# Patient Record
Sex: Female | Born: 1995 | Race: Black or African American | Hispanic: Yes | Marital: Single | Smoking: Never smoker
Health system: Southern US, Community
[De-identification: ages and names within clinical notes are randomized; demographics above are authoritative.]

## PROBLEM LIST (undated history)

## (undated) DIAGNOSIS — M329 Systemic lupus erythematosus, unspecified: Secondary | ICD-10-CM

## (undated) DIAGNOSIS — K5190 Ulcerative colitis, unspecified, without complications: Secondary | ICD-10-CM

## (undated) DIAGNOSIS — IMO0002 Reserved for concepts with insufficient information to code with codable children: Secondary | ICD-10-CM

## (undated) DIAGNOSIS — J45909 Unspecified asthma, uncomplicated: Secondary | ICD-10-CM

## (undated) DIAGNOSIS — K900 Celiac disease: Secondary | ICD-10-CM

## (undated) DIAGNOSIS — N809 Endometriosis, unspecified: Secondary | ICD-10-CM

## (undated) DIAGNOSIS — N83209 Unspecified ovarian cyst, unspecified side: Secondary | ICD-10-CM

## (undated) DIAGNOSIS — R197 Diarrhea, unspecified: Secondary | ICD-10-CM

## (undated) DIAGNOSIS — M359 Systemic involvement of connective tissue, unspecified: Secondary | ICD-10-CM

## (undated) DIAGNOSIS — R569 Unspecified convulsions: Secondary | ICD-10-CM

## (undated) HISTORY — PX: HERNIA REPAIR: SHX51

## (undated) HISTORY — PX: APPENDECTOMY: SHX54

## (undated) HISTORY — PX: EXCISION OF ENDOMETRIOMA: SHX6473

## (undated) HISTORY — PX: FOOT FRACTURE SURGERY: SHX645

## (undated) HISTORY — PX: CHOLECYSTECTOMY: SHX55

## (undated) HISTORY — PX: OTHER SURGICAL HISTORY: SHX169

## (undated) HISTORY — PX: TONSILLECTOMY: SUR1361

---
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
## 2013-01-22 HISTORY — PX: CHOLECYSTECTOMY: SHX55

## 2017-02-28 DIAGNOSIS — G43909 Migraine, unspecified, not intractable, without status migrainosus: Secondary | ICD-10-CM | POA: Insufficient documentation

## 2017-02-28 DIAGNOSIS — D649 Anemia, unspecified: Secondary | ICD-10-CM | POA: Insufficient documentation

## 2017-03-12 DIAGNOSIS — M328 Other forms of systemic lupus erythematosus: Secondary | ICD-10-CM | POA: Insufficient documentation

## 2017-03-12 DIAGNOSIS — M329 Systemic lupus erythematosus, unspecified: Secondary | ICD-10-CM | POA: Insufficient documentation

## 2017-07-02 DIAGNOSIS — Z79899 Other long term (current) drug therapy: Secondary | ICD-10-CM | POA: Insufficient documentation

## 2017-07-02 DIAGNOSIS — R760 Raised antibody titer: Secondary | ICD-10-CM | POA: Insufficient documentation

## 2017-07-02 DIAGNOSIS — Z86711 Personal history of pulmonary embolism: Secondary | ICD-10-CM | POA: Insufficient documentation

## 2018-03-14 DIAGNOSIS — N809 Endometriosis, unspecified: Secondary | ICD-10-CM | POA: Insufficient documentation

## 2018-11-17 DIAGNOSIS — N823 Fistula of vagina to large intestine: Secondary | ICD-10-CM | POA: Insufficient documentation

## 2018-11-28 ENCOUNTER — Emergency Department (HOSPITAL_COMMUNITY): Payer: 59

## 2018-11-28 ENCOUNTER — Encounter (HOSPITAL_COMMUNITY): Payer: Self-pay

## 2018-11-28 ENCOUNTER — Other Ambulatory Visit: Payer: Self-pay

## 2018-11-28 ENCOUNTER — Emergency Department (HOSPITAL_COMMUNITY)
Admission: EM | Admit: 2018-11-28 | Discharge: 2018-11-29 | Disposition: A | Discharge status: Home / Self Care | Payer: 59 | Attending: Emergency Medicine | Admitting: Emergency Medicine

## 2018-11-28 DIAGNOSIS — R1084 Generalized abdominal pain: Secondary | ICD-10-CM

## 2018-11-28 DIAGNOSIS — E860 Dehydration: Secondary | ICD-10-CM | POA: Diagnosis not present

## 2018-11-28 DIAGNOSIS — R197 Diarrhea, unspecified: Secondary | ICD-10-CM

## 2018-11-28 DIAGNOSIS — J45909 Unspecified asthma, uncomplicated: Secondary | ICD-10-CM | POA: Insufficient documentation

## 2018-11-28 DIAGNOSIS — R109 Unspecified abdominal pain: Secondary | ICD-10-CM | POA: Diagnosis present

## 2018-11-28 DIAGNOSIS — Z79899 Other long term (current) drug therapy: Secondary | ICD-10-CM | POA: Diagnosis not present

## 2018-11-28 HISTORY — DX: Unspecified asthma, uncomplicated: J45.909

## 2018-11-28 LAB — CBC
HCT: 39.5 % (ref 36.0–46.0)
Hemoglobin: 12.5 g/dL (ref 12.0–15.0)
MCH: 23.9 pg — ABNORMAL LOW (ref 26.0–34.0)
MCHC: 31.6 g/dL (ref 30.0–36.0)
MCV: 75.5 fL — ABNORMAL LOW (ref 80.0–100.0)
Platelets: 366 10*3/uL (ref 150–400)
RBC: 5.23 MIL/uL — ABNORMAL HIGH (ref 3.87–5.11)
RDW: 15.9 % — ABNORMAL HIGH (ref 11.5–15.5)
WBC: 7.2 10*3/uL (ref 4.0–10.5)
nRBC: 0 % (ref 0.0–0.2)

## 2018-11-28 LAB — URINALYSIS, ROUTINE W REFLEX MICROSCOPIC
Bilirubin Urine: NEGATIVE
Glucose, UA: NEGATIVE mg/dL
Hgb urine dipstick: NEGATIVE
Ketones, ur: NEGATIVE mg/dL
Nitrite: NEGATIVE
Protein, ur: NEGATIVE mg/dL
Specific Gravity, Urine: 1.024 (ref 1.005–1.030)
pH: 5 (ref 5.0–8.0)

## 2018-11-28 LAB — COMPREHENSIVE METABOLIC PANEL
ALT: 16 U/L (ref 0–44)
AST: 18 U/L (ref 15–41)
Albumin: 4.1 g/dL (ref 3.5–5.0)
Alkaline Phosphatase: 68 U/L (ref 38–126)
Anion gap: 9 (ref 5–15)
BUN: 6 mg/dL (ref 6–20)
CO2: 23 mmol/L (ref 22–32)
Calcium: 8.9 mg/dL (ref 8.9–10.3)
Chloride: 108 mmol/L (ref 98–111)
Creatinine, Ser: 0.7 mg/dL (ref 0.44–1.00)
GFR calc Af Amer: >60 mL/min (ref >60)
GFR calc non Af Amer: >60 mL/min (ref >60)
Glucose, Bld: 94 mg/dL (ref 70–99)
Potassium: 3.3 mmol/L — ABNORMAL LOW (ref 3.5–5.1)
Sodium: 140 mmol/L (ref 135–145)
Total Bilirubin: 0.4 mg/dL (ref 0.3–1.2)
Total Protein: 7.5 g/dL (ref 6.5–8.1)

## 2018-11-28 LAB — I-STAT BETA HCG BLOOD, ED (MC, WL, AP ONLY): I-stat hCG, quantitative: <5 m[IU]/mL (ref <5)

## 2018-11-28 LAB — LIPASE, BLOOD: Lipase: 32 U/L (ref 11–51)

## 2018-11-28 MED ORDER — SODIUM CHLORIDE 0.9 % IV BOLUS
1000.0000 mL | Freq: Once | INTRAVENOUS | Status: AC
  Administered 2018-11-28: 23:00:00 1000 mL via INTRAVENOUS

## 2018-11-28 MED ORDER — POTASSIUM CHLORIDE CRYS ER 20 MEQ PO TBCR
40.0000 meq | EXTENDED_RELEASE_TABLET | Freq: Once | ORAL | Status: AC
  Administered 2018-11-29: 40 meq via ORAL
  Filled 2018-11-28: qty 2

## 2018-11-28 MED ORDER — ONDANSETRON HCL 4 MG/2ML IJ SOLN
4.0000 mg | Freq: Once | INTRAMUSCULAR | Status: AC
  Administered 2018-11-28: 4 mg via INTRAVENOUS
  Filled 2018-11-28: qty 2

## 2018-11-28 MED ORDER — SODIUM CHLORIDE (PF) 0.9 % IJ SOLN
INTRAMUSCULAR | Status: AC
  Filled 2018-11-28: qty 50

## 2018-11-28 MED ORDER — MORPHINE SULFATE (PF) 4 MG/ML IV SOLN
4.0000 mg | Freq: Once | INTRAVENOUS | Status: AC
  Administered 2018-11-28: 4 mg via INTRAVENOUS
  Filled 2018-11-28: qty 1

## 2018-11-28 MED ORDER — SODIUM CHLORIDE 0.9% FLUSH
3.0000 mL | Freq: Once | INTRAVENOUS | Status: AC
  Administered 2018-11-28: 3 mL via INTRAVENOUS

## 2018-11-28 NOTE — ED Triage Notes (Signed)
Patient c/o sharp abdominal cramping, nausea, and diarrhea since August that has gotten worse the past few days.  MD worries for colitis   Patient has seen a GI specialist and is suppose to be having a colonoscopy anal rectal exam Monday. Patient states they are also going to fix her fistula.   HX. Ciliac disease   A/ox4 Ambulatory in triage.   7/10 pain

## 2018-11-28 NOTE — ED Notes (Signed)
Tried to get blood from pt but unsuccessful due to dehydration.

## 2018-11-28 NOTE — ED Provider Notes (Signed)
[name] DEPT Provider Note   CSN: [id number] Arrival date & time: 11/28/18  1732     History   Chief Complaint Chief Complaint  Patient presents with  . Abdominal Pain  . Diarrhea    HPI [name] is a 23 y.o. female with a hx of asthma, Lupus, endometriosis presents to the Emergency Department complaining of gradual, persistent, progressively worsening generalized abd pain. Pt reports associated nausea, vomiting and diarrhea onset Sept 2020.  She reports worsening of her symptoms in the last 4 days with a change in intensity and quality of her abd pain and increasing diarrhea.  Pt reports concerns for dehydration given her worsening symptoms.  No melena or hematochezia.  Pt reports intermittent vomiting usually controlled with Zofran.  Imodium does not improve the diarrhea. 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  Pt reports fever on 11/4 but has been taking tylenol for abd pain and has not had any additional measured fever since that time.   She is currently followed by [facility] and colorectal surgery.  She has a colonoscopy scheduled for Monday and a EUA for her rectovaginal fistula on Tuesday.  Pt surgical hx includes cholecystectomy (2015), endometrial revision (2020) and appendectomy/hernia repair (2020).  Pt reports hx of ilius requiring hospitalization x 3 over the last 4 years managed medically with NG tube.       The history is provided by the patient and medical records. No language interpreter was used.    Past Medical History:  Diagnosis Date  . Asthma     There are no active problems to display for this patient.   Past Surgical History:  Procedure Laterality Date  . APPENDECTOMY    . CHOLECYSTECTOMY       OB History   No obstetric history on file.      Home Medications    Prior to Admission medications   Medication Sig Start Date End Date Taking?  Authorizing Provider  diphenhydramine-acetaminophen (TYLENOL PM) 25-500 MG TABS tablet Take 2 tablets by mouth at bedtime as needed (sleep).   Yes [provider]  hydroxychloroquine (PLAQUENIL) 200 MG tablet Take 200 mg by mouth daily.  03/14/18  Yes [provider]  loperamide (IMODIUM) 2 MG capsule Take 2 mg by mouth 2 (two) times daily as needed for diarrhea or loose stools.   Yes [provider]  Norethindrone-Ethinyl Estradiol-Fe Biphas (LO LOESTRIN FE) 1 MG-10 MCG / 10 MCG tablet Take 1 tablet by mouth daily.   Yes [provider]  sertraline (ZOLOFT) 25 MG tablet Take 25 mg by mouth daily.    Yes [provider]  dicyclomine (BENTYL) 20 MG tablet Take 1 tablet (20 mg total) by mouth 2 (two) times daily. 11/29/18   Lashara Urey, Jarrett Soho, PA-C  ondansetron (ZOFRAN ODT) 8 MG disintegrating tablet 8mg  ODT q4 hours prn nausea 11/29/18   Jasnoor Trussell, Jarrett Soho, PA-C    Family History No family history on file.  Social History Social History   Tobacco Use  . Smoking status: Never Smoker  . Smokeless tobacco: Never Used  Substance Use Topics  . Alcohol use: Not on file  . Drug use: Not on file     Allergies   Azithromycin, Amoxicillin, Fish allergy, Pantoprazole, and Peanut-containing drug products   Review of Systems Review of Systems  Constitutional: Negative for appetite change, diaphoresis, fatigue, fever and unexpected weight change.  HENT: Negative for mouth sores.   Eyes:  Negative for visual disturbance.  Respiratory: Negative for cough, chest tightness, shortness of breath and wheezing.   Cardiovascular: Negative for chest pain.  Gastrointestinal: Positive for abdominal pain, diarrhea, nausea and vomiting. Negative for anal bleeding, blood in stool, constipation and rectal pain.  Endocrine: Negative for polydipsia, polyphagia and polyuria.  Genitourinary: Positive for vaginal discharge. Negative for dysuria, frequency, hematuria and  urgency.  Musculoskeletal: Negative for back pain and neck stiffness.  Skin: Negative for rash.  Allergic/Immunologic: Negative for immunocompromised state.  Neurological: Negative for syncope, light-headedness and headaches.  Hematological: Does not bruise/bleed easily.  Psychiatric/Behavioral: Negative for sleep disturbance. The patient is not nervous/anxious.      Physical Exam Updated Vital Signs BP 135/77 (BP Location: Right Arm)   Pulse 94   Temp 98.2 F (36.8 C) (Oral)   Resp 18   LMP 10/28/2018   SpO2 98%   Physical Exam Vitals signs and nursing note reviewed.  Constitutional:      General: She is not in acute distress.    Appearance: She is not diaphoretic.  HENT:     Head: Normocephalic.     Mouth/Throat:     Mouth: Mucous membranes are dry.  Eyes:     General: No scleral icterus.    Conjunctiva/sclera: Conjunctivae normal.  Neck:     Musculoskeletal: Normal range of motion.  Cardiovascular:     Rate and Rhythm: Normal rate and regular rhythm.     Pulses: Normal pulses.          Radial pulses are 2+ on the right side and 2+ on the left side.  Pulmonary:     Effort: No tachypnea, accessory muscle usage, prolonged expiration, respiratory distress or retractions.     Breath sounds: No stridor.     Comments: Equal chest rise. No increased work of breathing. Abdominal:     General: There is distension.     Palpations: Abdomen is soft.     Tenderness: There is generalized abdominal tenderness. There is guarding. There is no right CVA tenderness, left CVA tenderness or rebound. Negative signs include Murphy's sign and McBurney's sign.     Hernia: No hernia is present.     Comments: Multiple well healed surgical scars  Musculoskeletal:     Comments: Moves all extremities equally and without difficulty.  Skin:    General: Skin is warm and dry.     Capillary Refill: Capillary refill takes less than 2 seconds.  Neurological:     Mental Status: She is alert.      GCS: GCS eye subscore is 4. GCS verbal subscore is 5. GCS motor subscore is 6.     Comments: Speech is clear and goal oriented.  Psychiatric:        Mood and Affect: Mood normal.      ED Treatments / Results  Labs (all labs ordered are listed, but only abnormal results are displayed) Labs Reviewed  COMPREHENSIVE METABOLIC PANEL - Abnormal; Notable for the following components:      Result Value   Potassium 3.3 (*)    All other components within normal limits  CBC - Abnormal; Notable for the following components:   RBC 5.23 (*)    MCV 75.5 (*)    MCH 23.9 (*)    RDW 15.9 (*)    All other components within normal limits  URINALYSIS, ROUTINE W REFLEX MICROSCOPIC - Abnormal; Notable for the following components:   APPearance HAZY (*)    Leukocytes,Ua MODERATE (*)  Bacteria, UA MANY (*)    All other components within normal limits  URINE CULTURE  LIPASE, BLOOD  I-STAT BETA HCG BLOOD, ED ([facility], [facility], [facility] ONLY)    Radiology Ct Abdomen Pelvis Wo Contrast  Result Date: 11/29/2018 CLINICAL DATA:  Abdominal distension. Cramping. Nausea and vomiting. EXAM: CT ABDOMEN AND PELVIS WITHOUT CONTRAST TECHNIQUE: Multidetector CT imaging of the abdomen and pelvis was performed following the standard protocol without IV contrast. COMPARISON:  None. FINDINGS: Lower chest: The lung bases are clear. The heart size is normal. Hepatobiliary: There is a small 9 mm hypodensity in hepatic segment 8. This is not well characterized on this exam but is statistically most likely to represent a benign cyst. Status post cholecystectomy.There is no biliary ductal dilation. Pancreas: Normal contours without ductal dilatation. No peripancreatic fluid collection. Spleen: No splenic laceration or hematoma. Adrenals/Urinary Tract: --Adrenal glands: No adrenal hemorrhage. --Right kidney/ureter: No hydronephrosis or perinephric hematoma. --Left kidney/ureter: No hydronephrosis or perinephric hematoma. --Urinary bladder:  Unremarkable. Stomach/Bowel: --Stomach/Duodenum: No hiatal hernia or other gastric abnormality. Normal duodenal course and caliber. --Small bowel: No dilatation or inflammation. --Colon: No focal abnormality. --Appendix: Surgically absent. Vascular/Lymphatic: Normal course and caliber of the major abdominal vessels. --No retroperitoneal lymphadenopathy. --No mesenteric lymphadenopathy. --No pelvic or inguinal lymphadenopathy. Reproductive: Unremarkable Other: No ascites or free air. The abdominal wall is normal. Musculoskeletal. No acute displaced fractures. IMPRESSION: No acute abdominopelvic abnormality. Electronically Signed   By: Constance Holster M.D.   On: 11/29/2018 02:58    Procedures Procedures (including critical care time)  Medications Ordered in ED Medications  iohexol (OMNIPAQUE) 300 MG/ML solution 100 mL (has no administration in time range)  dicyclomine (BENTYL) capsule 10 mg (has no administration in time range)  sodium chloride flush (NS) 0.9 % injection 3 mL (3 mLs Intravenous Given 11/28/18 2302)  sodium chloride 0.9 % bolus 1,000 mL (0 mLs Intravenous Stopped 11/29/18 0038)  ondansetron (ZOFRAN) injection 4 mg (4 mg Intravenous Given 11/28/18 2302)  morphine 4 MG/ML injection 4 mg (4 mg Intravenous Given 11/28/18 2301)  potassium chloride SA (KLOR-CON) CR tablet 40 mEq (40 mEq Oral Given 11/29/18 0034)  diphenhydrAMINE (BENADRYL) injection 25 mg (25 mg Intravenous Given 11/29/18 0035)  promethazine (PHENERGAN) injection 12.5 mg (12.5 mg Intravenous Given 11/29/18 0035)  sodium chloride 0.9 % bolus 1,000 mL (0 mLs Intravenous Stopped 11/29/18 0344)     Initial Impression / Assessment and Plan / ED Course  I have reviewed the triage vital signs and the nursing notes.  Pertinent labs & imaging results that were available during my care of the patient were reviewed by me and considered in my medical decision making (see chart for details).  Clinical Course as of Nov 29 403  Sat  Nov 29, 2018  0030 Patient reports mild itching after pain medication but improvement in pain.  She does have some persistent nausea.  Medications ordered.  Will give additional fluids.   [HM]  0031 Hypokalemia noted and oral potassium given.  Potassium(!): 3.3 [HM]  0031 Patient now reporting progressive itching after previous CT scans with contrast.  Will change to CT without.  CT ABDOMEN PELVIS W CONTRAST [HM]  0403 Pt reports she is feeling much better.  Pain is improved.    [HM]    Clinical Course User Index [HM] Elbert Polyakov, Jarrett Soho, [place]       Patient presents with generalized abdominal pain and cramping along with diarrhea and concerns for dehydration.  Patient with previous cholecystectomy and appendectomy thus  ruling out cholecystitis and appendicitis.  Labs are reassuring with mild hypokalemia.  Patient given potassium without emesis.  Fluids given here in the emergency department.  Concern for possible UTI however no dysuria.  Patient does have a rectovaginal fistula which may be contributing to contamination of her urine.  Urine culture sent.    Symptoms controlled here in the emergency department.  Given change in symptoms, CT scan ordered.  CT scan is reassuring without specific findings for colitis.  No bowel obstruction or ileus.  Patient improved after treatment here in the emergency department.  Will discharge to home.  She has appointment scheduled for Monday and Tuesday.  Discussed reasons to return immediately to the emergency department.  Patient states understanding and is in agreement with the plan.  Final Clinical Impressions(s) / ED Diagnoses   Final diagnoses:  Diarrhea, unspecified type  Dehydration  Generalized abdominal pain    ED Discharge Orders         Ordered    dicyclomine (BENTYL) 20 MG tablet  2 times daily     11/29/18 0358    ondansetron (ZOFRAN ODT) 8 MG disintegrating tablet     11/29/18 0358           Jaymarie Yeakel, Jarrett Soho, PA-C  11/29/18 0405    Lajean Saver, MD 12/03/18 1505

## 2018-11-29 ENCOUNTER — Encounter (HOSPITAL_COMMUNITY): Payer: Self-pay

## 2018-11-29 ENCOUNTER — Emergency Department (HOSPITAL_COMMUNITY): Payer: 59

## 2018-11-29 MED ORDER — SODIUM CHLORIDE 0.9 % IV BOLUS
1000.0000 mL | Freq: Once | INTRAVENOUS | Status: AC
  Administered 2018-11-29: 1000 mL via INTRAVENOUS

## 2018-11-29 MED ORDER — DICYCLOMINE HCL 20 MG PO TABS: 20.0000 mg | ORAL_TABLET | Freq: Two times a day (BID) | ORAL | 0 refills | Status: DC

## 2018-11-29 MED ORDER — PROMETHAZINE HCL 25 MG/ML IJ SOLN
12.5000 mg | Freq: Once | INTRAMUSCULAR | Status: AC
  Administered 2018-11-29: 12.5 mg via INTRAVENOUS
  Filled 2018-11-29: qty 1

## 2018-11-29 MED ORDER — ONDANSETRON 8 MG PO TBDP: ORAL_TABLET | ORAL | 0 refills | Status: DC

## 2018-11-29 MED ORDER — IOHEXOL 300 MG/ML  SOLN: 100.0000 mL | Freq: Once | INTRAMUSCULAR | Status: DC | PRN

## 2018-11-29 MED ORDER — DIPHENHYDRAMINE HCL 50 MG/ML IJ SOLN
25.0000 mg | Freq: Once | INTRAMUSCULAR | Status: AC
  Administered 2018-11-29: 25 mg via INTRAVENOUS
  Filled 2018-11-29: qty 1

## 2018-11-29 MED ORDER — DICYCLOMINE HCL 10 MG PO CAPS
10.0000 mg | ORAL_CAPSULE | Freq: Once | ORAL | Status: AC
  Administered 2018-11-29: 10 mg via ORAL
  Filled 2018-11-29: qty 1

## 2018-11-29 NOTE — Discharge Instructions (Signed)
1. Medications: Continue home medications, use Bentyl for cramping 2. Treatment: rest, drink plenty of fluids,  3. Follow Up: Please followup with your gastroenterologist and colorectal surgeon for further evaluation and treatment.  Please keep your appointment on Monday and Tuesday. Please return to the ER for new or worsening symptoms, high fevers, persistent vomiting, blood in stool or emesis.

## 2018-12-14 ENCOUNTER — Encounter (HOSPITAL_COMMUNITY): Payer: Self-pay | Admitting: *Deleted

## 2018-12-14 ENCOUNTER — Ambulatory Visit (HOSPITAL_COMMUNITY)
Admission: EM | Admit: 2018-12-14 | Discharge: 2018-12-14 | Disposition: A | Discharge status: Home / Self Care | Payer: 59 | Attending: Family Medicine | Admitting: Family Medicine

## 2018-12-14 ENCOUNTER — Other Ambulatory Visit: Payer: Self-pay

## 2018-12-14 DIAGNOSIS — R05 Cough: Secondary | ICD-10-CM | POA: Insufficient documentation

## 2018-12-14 DIAGNOSIS — R6883 Chills (without fever): Secondary | ICD-10-CM | POA: Diagnosis not present

## 2018-12-14 DIAGNOSIS — R059 Cough, unspecified: Secondary | ICD-10-CM

## 2018-12-14 DIAGNOSIS — Z880 Allergy status to penicillin: Secondary | ICD-10-CM | POA: Diagnosis not present

## 2018-12-14 DIAGNOSIS — Z7951 Long term (current) use of inhaled steroids: Secondary | ICD-10-CM | POA: Insufficient documentation

## 2018-12-14 DIAGNOSIS — Z881 Allergy status to other antibiotic agents status: Secondary | ICD-10-CM | POA: Diagnosis not present

## 2018-12-14 DIAGNOSIS — R0602 Shortness of breath: Secondary | ICD-10-CM | POA: Diagnosis not present

## 2018-12-14 DIAGNOSIS — J45909 Unspecified asthma, uncomplicated: Secondary | ICD-10-CM | POA: Diagnosis not present

## 2018-12-14 DIAGNOSIS — Z79899 Other long term (current) drug therapy: Secondary | ICD-10-CM | POA: Insufficient documentation

## 2018-12-14 DIAGNOSIS — Z20828 Contact with and (suspected) exposure to other viral communicable diseases: Secondary | ICD-10-CM | POA: Diagnosis not present

## 2018-12-14 DIAGNOSIS — R000 Tachycardia, unspecified: Secondary | ICD-10-CM | POA: Diagnosis not present

## 2018-12-14 DIAGNOSIS — M329 Systemic lupus erythematosus, unspecified: Secondary | ICD-10-CM | POA: Diagnosis not present

## 2018-12-14 DIAGNOSIS — K900 Celiac disease: Secondary | ICD-10-CM | POA: Insufficient documentation

## 2018-12-14 HISTORY — DX: Systemic lupus erythematosus, unspecified: M32.9

## 2018-12-14 HISTORY — DX: Endometriosis, unspecified: N80.9

## 2018-12-14 HISTORY — DX: Celiac disease: K90.0

## 2018-12-14 HISTORY — DX: Reserved for concepts with insufficient information to code with codable children: IMO0002

## 2018-12-14 LAB — POC SARS CORONAVIRUS 2 AG -  ED: SARS Coronavirus 2 Ag: NEGATIVE

## 2018-12-14 LAB — POC SARS CORONAVIRUS 2 AG: SARS Coronavirus 2 Ag: NEGATIVE

## 2018-12-14 MED ORDER — HYDROCOD POLST-CPM POLST ER 10-8 MG/5ML PO SUER: 5.0000 mL | Freq: Two times a day (BID) | ORAL | 0 refills | Status: DC | PRN

## 2018-12-14 NOTE — Discharge Instructions (Addendum)
Recommend continue current medication as directed. Add Tussionex 1 teaspoon every 12 hours as needed for cough. Rest. STAY at HOME. If cough, shortness of breath worsen, go to the ER ASAP for further evaluation. Otherwise follow-up pending COVID 19 test results.

## 2018-12-14 NOTE — ED Provider Notes (Signed)
[facility]    CSN: [id number] Arrival date & time: 12/14/18  1223      History   Chief Complaint Chief Complaint  Patient presents with   Cough   Chills    HPI [name] is a 23 y.o. female.   23 year old female presents with continued cough, shortness of breath, chills and possible fever for the past 3 to 4 days. Started with mild body aches and cough 4 days ago. Has history of asthma and was concerned about symptoms and was seen at a local Urgent Care (unable to pull up notes/records). They performed a chest x-ray which she indicated was "negative' and started her on Doxycycline, Tessalon cough pills, Prednisone 12 day dose taper, and told her to continue her Flovent and Albuterol inhaler. She then developed a low grade fever and worsening of cough. Denies any nasal congestion. Has an irritated throat and vomited today due to the cough. Has taken Theraflu and Tessalon with no relief. Unable to sleep at night. 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 She declined a rapid COVID test today indicating that she just had the COVID test done about 10 days ago for a GI procedure which was negative. She was positive for COVID back in April 2020 and believes she can not get COVID again. She had contacted the other Urgent Care center today regarding her symptoms and they recommended she go to the ER. Other chronic health issues include Celiac disease and chronic GI issues/diarrhea, Lupus, Endometriosis and Insomnia. Currently on Plaquenil, Zoloft, Lo loestrin OCP, and Bentyl daily and Immodium, Zofran and Tylenol PM prn.   The history is provided by the patient.    Past Medical History:  Diagnosis Date   Asthma    Celiac disease    Endometriosis    Lupus ([place])     There are no active problems to display for this patient.   Past Surgical History:  Procedure Laterality Date   APPENDECTOMY     CHOLECYSTECTOMY       OB History   No obstetric history on file.      Home Medications    Prior to Admission medications   Medication Sig Start Date End Date Taking? Authorizing Provider  ALBUTEROL IN Inhale into the lungs.   Yes [provider]  benzonatate (TESSALON) 100 MG capsule Take by mouth 3 (three) times daily as needed for cough.   Yes [provider]  dicyclomine (BENTYL) 20 MG tablet Take 1 tablet (20 mg total) by mouth 2 (two) times daily. 11/29/18  Yes Muthersbaugh, Jarrett Soho, PA-C  diphenhydramine-acetaminophen (TYLENOL PM) 25-500 MG TABS tablet Take 2 tablets by mouth at bedtime as needed (sleep).   Yes [provider]  DOXYCYCLINE HYCLATE PO Take by mouth.   Yes [provider]  Fluticasone Propionate HFA (FLOVENT HFA IN) Inhale into the lungs.   Yes [provider]  hydroxychloroquine (PLAQUENIL) 200 MG tablet Take 200 mg by mouth daily.  03/14/18  Yes [provider]  Norethindrone-Ethinyl Estradiol-Fe Biphas (LO LOESTRIN FE) 1 MG-10 MCG / 10 MCG tablet Take 1 tablet by mouth daily.   Yes [provider]  sertraline (ZOLOFT) 25 MG tablet Take 25 mg by mouth daily.    Yes [provider]  chlorpheniramine-HYDROcodone (TUSSIONEX PENNKINETIC ER) 10-8 MG/5ML SUER Take 5 mLs by mouth every 12 (twelve) hours as needed for cough. 12/14/18   Katy Apo,  NP  loperamide (IMODIUM) 2 MG capsule Take 2 mg by mouth 2 (two) times daily as needed for diarrhea or loose stools.    [provider]  ondansetron (ZOFRAN ODT) 8 MG disintegrating tablet 5m ODT q4 hours prn nausea 11/29/18   Muthersbaugh, HJarrett Soho PA-C    Family History Family History  Problem Relation Age of Onset   Hypertension Mother    Hypercholesterolemia Mother    Diabetes Father    Hypertension Father    Cancer Father     Social History Social History   Tobacco Use   Smoking status: Never Smoker   Smokeless tobacco: Never Used  Substance  Use Topics   Alcohol use: Never    Frequency: Never   Drug use: Never     Allergies   Azithromycin, Amoxicillin, Fish allergy, Pantoprazole, and Peanut-containing drug products   Review of Systems Review of Systems  Constitutional: Positive for activity change, appetite change, chills, diaphoresis (night sweats), fatigue and fever.  HENT: Positive for postnasal drip and sore throat. Negative for congestion, ear discharge, ear pain, facial swelling, mouth sores, nosebleeds, rhinorrhea, sinus pressure, sinus pain, sneezing and trouble swallowing.   Eyes: Negative for photophobia, pain, discharge, redness, itching and visual disturbance.  Respiratory: Positive for cough, chest tightness and shortness of breath. Negative for wheezing.   Cardiovascular: Negative for chest pain and palpitations.  Gastrointestinal: Positive for abdominal pain, diarrhea, nausea and vomiting.  Musculoskeletal: Positive for arthralgias and myalgias. Negative for neck pain and neck stiffness.  Skin: Negative for color change, rash and wound.  Allergic/Immunologic: Negative for environmental allergies, food allergies and immunocompromised state.  Neurological: Positive for weakness, light-headedness and headaches. Negative for dizziness, tremors, seizures, syncope and numbness.  Hematological: Negative for adenopathy. Does not bruise/bleed easily.  Psychiatric/Behavioral: Positive for sleep disturbance.     Physical Exam Triage Vital Signs ED Triage Vitals  Enc Vitals Group     BP 12/14/18 1259 (!) 116/102     Pulse Rate 12/14/18 1259 (!) 116     Resp 12/14/18 1259 20     Temp 12/14/18 1259 98.6 F (37 C)     Temp src --      SpO2 12/14/18 1259 99 %     Weight --      Height --      Head Circumference --      Peak Flow --      Pain Score 12/14/18 1301 8     Pain Loc --      Pain Edu? --      Excl. in [place] --    No data found.  Updated Vital Signs BP (!) 116/102 Comment: with pt frequently  coughing during BP reading   Pulse (!) 116    Temp 98.6 F (37 C)    Resp 20    SpO2 99%   Visual Acuity Right Eye Distance:   Left Eye Distance:   Bilateral Distance:    Right Eye Near:   Left Eye Near:    Bilateral Near:     Physical Exam Vitals signs and nursing note reviewed.  Constitutional:      General: She is awake. She is not in acute distress.    Appearance: She is well-developed, well-groomed and overweight. She is ill-appearing.     Comments: Patient sitting comfortably in exam chair in no acute distress but appears ill and had just vomited due to cough.   HENT:     Head: Normocephalic and atraumatic.  Right Ear: Hearing, tympanic membrane, ear canal and external ear normal.     Left Ear: Hearing, tympanic membrane, ear canal and external ear normal.     Nose: Nose normal.     Right Sinus: No maxillary sinus tenderness or frontal sinus tenderness.     Left Sinus: No maxillary sinus tenderness or frontal sinus tenderness.     Mouth/Throat:     Lips: Pink.     Mouth: Mucous membranes are moist.     Pharynx: Uvula midline. Posterior oropharyngeal erythema present. No pharyngeal swelling, oropharyngeal exudate or uvula swelling.  Eyes:     Extraocular Movements: Extraocular movements intact.     Conjunctiva/sclera: Conjunctivae normal.  Neck:     Musculoskeletal: Normal range of motion and neck supple. No neck rigidity or muscular tenderness.  Cardiovascular:     Rate and Rhythm: Regular rhythm. Tachycardia present.     Heart sounds: Normal heart sounds. No murmur.  Pulmonary:     Effort: Pulmonary effort is normal. Tachypnea (borderline) present. No respiratory distress.     Breath sounds: Normal air entry. No stridor or decreased air movement. Examination of the right-upper field reveals decreased breath sounds. Examination of the left-upper field reveals decreased breath sounds. Examination of the right-middle field reveals decreased breath sounds. Examination of  the right-lower field reveals decreased breath sounds. Examination of the left-lower field reveals decreased breath sounds. Decreased breath sounds present. No wheezing, rhonchi or rales.     Comments: All lungs fields have quieter breath sounds but no distinct wheezing or rhonchi/crackles heard.  Musculoskeletal: Normal range of motion.  Lymphadenopathy:     Cervical: No cervical adenopathy.  Skin:    General: Skin is warm and dry.     Capillary Refill: Capillary refill takes less than 2 seconds.     Findings: No rash.  Neurological:     General: No focal deficit present.     Mental Status: She is alert and oriented to person, place, and time.  Psychiatric:        Attention and Perception: Attention normal.        Mood and Affect: Mood is anxious.        Speech: Speech normal.        Behavior: Behavior normal. Behavior is cooperative.        Thought Content: Thought content normal.      UC Treatments / Results  Labs (all labs ordered are listed, but only abnormal results are displayed) Labs Reviewed  NOVEL CORONAVIRUS, NAA (HOSP ORDER, SEND-OUT TO REF LAB; TAT 18-24 HRS)  POC SARS CORONAVIRUS 2 AG -  ED  POC SARS CORONAVIRUS 2 AG    EKG   Radiology No results found.  Procedures Procedures (including critical care time)  Medications Ordered in UC Medications - No data to display  Initial Impression / Assessment and Plan / UC Course  I have reviewed the triage vital signs and the nursing notes.  Pertinent labs & imaging results that were available during my care of the patient were reviewed by me and considered in my medical decision making (see chart for details).    Reviewed with patient concern over COVID 19 infection. Discussed previous infection and possibility of reinfection- data is unknown at this time. Patient agreed to testing today. Rapid test was negative so specimen sent for PCR testing. Discussed that she could still have COVID 19 or another viral illness  and that is why the antibiotic is not helping. Since she just  had a chest x-ray 2 days ago, pulse Ox is normal and COVID PCR test is pending, would wait to repeat chest x-ray if COVID test is negative and symptoms continue to worsen. Continue Doxycycline as directed for now. Continue Prednisone, Flovent and Albuterol as prescribed. May take Tussionex 1 teaspoon every 12 hours as needed for cough. Rest. Stay at home and do not go to school/class!!! Patient stable. Patient declines any further testing or evaluation today. Continue Zofran as needed for nausea and vomiting. Continue to push fluids to stay hydrated. If cough, shortness of breath worsens, go to the ER ASAP. Otherwise, follow-up pending COVID 19 test results.  Final Clinical Impressions(s) / UC Diagnoses   Final diagnoses:  Cough  Chills  Shortness of breath  Tachycardia with heart rate 100-120 beats per minute     Discharge Instructions     Recommend continue current medication as directed. Add Tussionex 1 teaspoon every 12 hours as needed for cough. Rest. STAY at HOME. If cough, shortness of breath worsen, go to the ER ASAP for further evaluation. Otherwise follow-up pending COVID 19 test results.     ED Prescriptions    Medication Sig Dispense Auth. Provider   chlorpheniramine-HYDROcodone (TUSSIONEX PENNKINETIC ER) 10-8 MG/5ML SUER Take 5 mLs by mouth every 12 (twelve) hours as needed for cough. 70 mL Katy Apo, NP     PDMP was reviewed at time of visit but did not cross over to Epic. Current Rx is for Ambien- last prescribed 12/05/2018 which patient did not disclose. No other concurrent controlled medications. At this time, I feel the benefits outweigh the risks of prescribing a controlled substance cough medication at this time.    Katy Apo, NP 12/14/18 2107

## 2018-12-14 NOTE — ED Triage Notes (Signed)
Pt c/o cough with painful coughing, chills, SOB x 2 days; was seen @ an urgent care 2 days ago - placed on doxycycline, inhaler, and nebulizers.  States feels like meds aren't helping. When RN suggested Covid test - pt stated "I don't need one, I had 2 last wk for a procedure"; states cough started 2 days ago; informed pt she should have test again since sxs started just 2 days ago, pt then stated "I was positive in April".

## 2018-12-15 LAB — NOVEL CORONAVIRUS, NAA (HOSP ORDER, SEND-OUT TO REF LAB; TAT 18-24 HRS): SARS-CoV-2, NAA: NOT DETECTED

## 2019-03-25 ENCOUNTER — Other Ambulatory Visit: Payer: Self-pay

## 2019-03-25 ENCOUNTER — Ambulatory Visit (HOSPITAL_COMMUNITY)
Admission: EM | Admit: 2019-03-25 | Discharge: 2019-03-25 | Disposition: A | Discharge status: Home / Self Care | Payer: PPO | Attending: Family Medicine | Admitting: Family Medicine

## 2019-03-25 ENCOUNTER — Encounter (HOSPITAL_COMMUNITY): Payer: Self-pay

## 2019-03-25 DIAGNOSIS — R112 Nausea with vomiting, unspecified: Secondary | ICD-10-CM | POA: Diagnosis present

## 2019-03-25 DIAGNOSIS — R1030 Lower abdominal pain, unspecified: Secondary | ICD-10-CM | POA: Insufficient documentation

## 2019-03-25 LAB — POCT URINALYSIS DIP (DEVICE)
Bilirubin Urine: NEGATIVE
Glucose, UA: NEGATIVE mg/dL
Ketones, ur: NEGATIVE mg/dL
Nitrite: NEGATIVE
Protein, ur: NEGATIVE mg/dL
Specific Gravity, Urine: 1.025 (ref 1.005–1.030)
Urobilinogen, UA: 0.2 mg/dL (ref 0.0–1.0)
pH: 6 (ref 5.0–8.0)

## 2019-03-25 MED ORDER — NITROFURANTOIN MONOHYD MACRO 100 MG PO CAPS: 100.0000 mg | ORAL_CAPSULE | Freq: Two times a day (BID) | ORAL | 0 refills | Status: DC

## 2019-03-25 MED ORDER — ONDANSETRON HCL 4 MG PO TABS: 4.0000 mg | ORAL_TABLET | Freq: Four times a day (QID) | ORAL | 0 refills | Status: DC

## 2019-03-25 MED ORDER — IBUPROFEN 800 MG PO TABS: 800.0000 mg | ORAL_TABLET | Freq: Three times a day (TID) | ORAL | 0 refills | Status: DC | PRN

## 2019-03-25 MED ORDER — IBUPROFEN 800 MG PO TABS
800.0000 mg | ORAL_TABLET | Freq: Once | ORAL | Status: AC
  Administered 2019-03-25: 800 mg via ORAL

## 2019-03-25 MED ORDER — IBUPROFEN 800 MG PO TABS
ORAL_TABLET | ORAL | Status: AC
  Filled 2019-03-25: qty 1

## 2019-03-25 NOTE — Discharge Instructions (Addendum)
You may have a urinary tract infection. We are going to culture your urine and will call you as soon as we have the results.   Drink plenty of water, 8-10 glasses per day.   You may take AZO over the counter for painful urination.  Follow up with your primary care provider as needed.   Go to the Emergency Department if you experience severe pain, shortness of breath, high fever, or other concerns.

## 2019-03-25 NOTE — ED Triage Notes (Signed)
Pt state she has abdominal pain because he she has endometriosis and she was up all night vomiting last night from the pain. Pt state she  needs a note for school.

## 2019-03-25 NOTE — ED Provider Notes (Signed)
[name]    CSN: [id number] Arrival date & time: 03/25/19  1401      History   Chief Complaint Chief Complaint  Patient presents with  . Appointment    4 pm  . Abdominal Pain    HPI [name] is a 24 y.o. female.   Reports with lower abdominal cramping, headache.  Denies abnormal vaginal bleeding, fever, shortness of breath, body aches, chills, diarrhea, rash, other symptoms.  Patient has a history significant for lupus.  She states that she also has a history of endometriosis, has had surgery for this and takes birth control pills continuously said that she does not have a regular period.  Reports that this also does not feel like her normal lupus flare, but that it feels more like uterine cramping.   ROS Per HPI  The history is provided by the patient.    Past Medical History:  Diagnosis Date  . Asthma   . Celiac disease   . Endometriosis   . Lupus ([place])     There are no problems to display for this patient.   Past Surgical History:  Procedure Laterality Date  . APPENDECTOMY    . CHOLECYSTECTOMY      OB History   No obstetric history on file.      Home Medications    Prior to Admission medications   Medication Sig Start Date End Date Taking? Authorizing Provider  ALBUTEROL IN Inhale into the lungs.    [provider]  benzonatate (TESSALON) 100 MG capsule Take by mouth 3 (three) times daily as needed for cough.    [provider]  chlorpheniramine-HYDROcodone (TUSSIONEX PENNKINETIC ER) 10-8 MG/5ML SUER Take 5 mLs by mouth every 12 (twelve) hours as needed for cough. Patient not taking: Reported on 03/25/2019 12/14/18   Katy Apo, NP  dicyclomine (BENTYL) 20 MG tablet Take 1 tablet (20 mg total) by mouth 2 (two) times daily. 11/29/18   Muthersbaugh, Jarrett Soho, PA-C  diphenhydramine-acetaminophen (TYLENOL PM) 25-500 MG TABS tablet Take 2 tablets by mouth at bedtime as needed (sleep).    [provider]    DOXYCYCLINE HYCLATE PO Take by mouth.    [provider]  Fluticasone Propionate HFA (FLOVENT HFA IN) Inhale into the lungs.    [provider]  hydroxychloroquine (PLAQUENIL) 200 MG tablet Take 200 mg by mouth daily.  03/14/18   [provider]  ibuprofen (ADVIL) 800 MG tablet Take 1 tablet (800 mg total) by mouth every 8 (eight) hours as needed for moderate pain. 03/25/19   Faustino Congress, NP  loperamide (IMODIUM) 2 MG capsule Take 2 mg by mouth 2 (two) times daily as needed for diarrhea or loose stools.    [provider]  nitrofurantoin, macrocrystal-monohydrate, (MACROBID) 100 MG capsule Take 1 capsule (100 mg total) by mouth 2 (two) times daily. 03/25/19   Faustino Congress, NP  Norethindrone-Ethinyl Estradiol-Fe Biphas (LO LOESTRIN FE) 1 MG-10 MCG / 10 MCG tablet Take 1 tablet by mouth daily.    [provider]  ondansetron (ZOFRAN ODT) 8 MG disintegrating tablet 42m ODT q4 hours prn nausea Patient not taking: Reported on 03/25/2019 11/29/18   Muthersbaugh, HJarrett Soho PA-C  ondansetron (ZOFRAN) 4 MG tablet Take 1 tablet (4 mg total) by mouth every 6 (six) hours. 03/25/19   MFaustino Congress NP  sertraline (ZOLOFT) 25 MG tablet Take 25 mg by mouth daily.     [provider]    Family History Family History  Problem Relation Age of Onset  . Hypertension Mother   . Hypercholesterolemia Mother   . Diabetes Father   . Hypertension Father   . Cancer Father     Social History Social History   Tobacco Use  . Smoking status: Never Smoker  . Smokeless tobacco: Never Used  Substance Use Topics  . Alcohol use: Never  . Drug use: Never     Allergies   Azithromycin, Amoxicillin, Fish allergy, Pantoprazole, and Peanut-containing drug products   Review of Systems Review of Systems   Physical Exam Triage Vital Signs ED Triage Vitals  Enc Vitals Group     BP      Pulse      Resp      Temp      Temp src      SpO2       Weight      Height      Head Circumference      Peak Flow      Pain Score      Pain Loc      Pain Edu?      Excl. in [place]?    No data found.  Updated Vital Signs BP 100/64 (BP Location: Right Arm)   Pulse 87   Temp 98 F (36.7 C) (Oral)   Resp 18   Wt 252 lb (114.3 kg)   SpO2 100%      Physical Exam Vitals and nursing note reviewed.  Constitutional:      General: She is not in acute distress.    Appearance: She is well-developed. She is obese.  HENT:     Head: Normocephalic and atraumatic.  Eyes:     Conjunctiva/sclera: Conjunctivae normal.  Cardiovascular:     Rate and Rhythm: Normal rate and regular rhythm.     Heart sounds: Normal heart sounds. No murmur.  Pulmonary:     Effort: Pulmonary effort is normal. No respiratory distress.     Breath sounds: Normal breath sounds. No stridor. No wheezing, rhonchi or rales.  Abdominal:     General: Bowel sounds are normal.     Palpations: Abdomen is soft.     Tenderness: There is abdominal tenderness in the suprapubic area.  Musculoskeletal:     Cervical back: Neck supple.  Skin:    General: Skin is warm and dry.     Capillary Refill: Capillary refill takes less than 2 seconds.  Neurological:     General: No focal deficit present.     Mental Status: She is alert.  Psychiatric:        Mood and Affect: Mood normal.        Behavior: Behavior normal.      UC Treatments / Results  Labs (all labs ordered are listed, but only abnormal results are displayed) Labs Reviewed  URINE CULTURE - Abnormal; Notable for the following components:      Result Value   Culture MULTIPLE SPECIES PRESENT, SUGGEST RECOLLECTION (*)    All other components within normal limits  POCT URINALYSIS DIP (DEVICE) - Abnormal; Notable for the following components:   Hgb urine dipstick LARGE (*)    Leukocytes,Ua TRACE (*)    All other components within normal limits    EKG   Radiology No results found.  Procedures Procedures (including  critical care time)  Medications Ordered in UC Medications  ibuprofen (ADVIL) tablet 800 mg (800 mg Oral Given 03/25/19 1512)    Initial Impression / Assessment and Plan / UC  Course  I have reviewed the triage vital signs and the nursing notes.  Pertinent labs & imaging results that were available during my care of the patient were reviewed by me and considered in my medical decision making (see chart for details).  Clinical Course as of Mar 26 1346  Wed Mar 25, 2019  1452 POCT Urinalysis, Dipstick [SM]  Thu Mar 26, 2019  1346 Culture(!): MULTIPLE SPECIES PRESENT, SUGGEST RECOLLECTION [SM]  1347 Urine culture(!) [SM]    Clinical Course User Index [SM] Faustino Congress, NP    Presents with low abdominal pain, suprapubic pain.  Vomiting, patient states that this is correlated with her pain.  UA in office is positive for nitrates.  Will send in Macrobid 100 mg twice daily x5 days.  Sent in ibuprofen 800 mg every 8 hours as needed for pain.  Also sent in Zofran 4 mg every 6-8 hours as needed for nausea.  Will culture urine and inform patient of results.  Follow-up with primary care this office as needed.  May return to school tomorrow. Final Clinical Impressions(s) / UC Diagnoses   Final diagnoses:  Lower abdominal pain  Nausea and vomiting, intractability of vomiting not specified, unspecified vomiting type     Discharge Instructions     You may have a urinary tract infection. We are going to culture your urine and will call you as soon as we have the results.   Drink plenty of water, 8-10 glasses per day.   You may take AZO over the counter for painful urination.  Follow up with your primary care provider as needed.   Go to the Emergency Department if you experience severe pain, shortness of breath, high fever, or other concerns.      ED Prescriptions    Medication Sig Dispense Auth. Provider   ibuprofen (ADVIL) 800 MG tablet Take 1 tablet (800 mg total) by mouth every  8 (eight) hours as needed for moderate pain. 21 tablet Faustino Congress, NP   ondansetron (ZOFRAN) 4 MG tablet Take 1 tablet (4 mg total) by mouth every 6 (six) hours. 12 tablet Faustino Congress, NP   nitrofurantoin, macrocrystal-monohydrate, (MACROBID) 100 MG capsule Take 1 capsule (100 mg total) by mouth 2 (two) times daily. 10 capsule Faustino Congress, NP     I have reviewed the PDMP during this encounter.   Faustino Congress, NP 03/26/19 1348

## 2019-03-26 LAB — URINE CULTURE

## 2019-04-11 ENCOUNTER — Encounter (HOSPITAL_COMMUNITY): Payer: Self-pay

## 2019-04-11 ENCOUNTER — Ambulatory Visit (HOSPITAL_COMMUNITY)
Admission: EM | Admit: 2019-04-11 | Discharge: 2019-04-11 | Disposition: A | Discharge status: Home / Self Care | Payer: PPO | Attending: Family Medicine | Admitting: Family Medicine

## 2019-04-11 ENCOUNTER — Other Ambulatory Visit: Payer: Self-pay

## 2019-04-11 DIAGNOSIS — Z7951 Long term (current) use of inhaled steroids: Secondary | ICD-10-CM | POA: Insufficient documentation

## 2019-04-11 DIAGNOSIS — Z20822 Contact with and (suspected) exposure to covid-19: Secondary | ICD-10-CM | POA: Diagnosis not present

## 2019-04-11 DIAGNOSIS — J069 Acute upper respiratory infection, unspecified: Secondary | ICD-10-CM | POA: Insufficient documentation

## 2019-04-11 DIAGNOSIS — Z8249 Family history of ischemic heart disease and other diseases of the circulatory system: Secondary | ICD-10-CM | POA: Diagnosis not present

## 2019-04-11 DIAGNOSIS — Z880 Allergy status to penicillin: Secondary | ICD-10-CM | POA: Insufficient documentation

## 2019-04-11 DIAGNOSIS — K900 Celiac disease: Secondary | ICD-10-CM | POA: Insufficient documentation

## 2019-04-11 DIAGNOSIS — Z91013 Allergy to seafood: Secondary | ICD-10-CM | POA: Insufficient documentation

## 2019-04-11 DIAGNOSIS — R05 Cough: Secondary | ICD-10-CM | POA: Diagnosis present

## 2019-04-11 DIAGNOSIS — Z881 Allergy status to other antibiotic agents status: Secondary | ICD-10-CM | POA: Insufficient documentation

## 2019-04-11 DIAGNOSIS — Z79899 Other long term (current) drug therapy: Secondary | ICD-10-CM | POA: Insufficient documentation

## 2019-04-11 DIAGNOSIS — Z833 Family history of diabetes mellitus: Secondary | ICD-10-CM | POA: Insufficient documentation

## 2019-04-11 DIAGNOSIS — Z8349 Family history of other endocrine, nutritional and metabolic diseases: Secondary | ICD-10-CM | POA: Diagnosis not present

## 2019-04-11 LAB — SARS CORONAVIRUS 2 (TAT 6-24 HRS): SARS Coronavirus 2: NEGATIVE

## 2019-04-11 MED ORDER — HYDROCOD POLST-CPM POLST ER 10-8 MG/5ML PO SUER: 5.0000 mL | Freq: Two times a day (BID) | ORAL | 0 refills | Status: DC | PRN

## 2019-04-11 NOTE — ED Triage Notes (Signed)
Patient complains of cough, chills, sore throat and body aches x yesterday.

## 2019-04-11 NOTE — ED Provider Notes (Signed)
[facility]    CSN: [id number] Arrival date & time: 04/11/19  1254      History   Chief Complaint Chief Complaint  Patient presents with  . Cough    HPI [name] is a 24 y.o. female.   Patient is a 24 year old female past medical history of asthma, celiac, endometriosis, lupus.  She presents today with hacking cough, chills, sore throat, body aches and fever.  Symptoms have been constant since yesterday.  She is been taking Tessalon Perles and Robitussin for the cough without much relief.  Denies any recent sick contacts.  Reports she received her Covid vaccine back in December.  No shortness of breath.  Has had posttussive vomiting.  Denies any wheezing.  ROS per HPI      Past Medical History:  Diagnosis Date  . Asthma   . Celiac disease   . Endometriosis   . [place]     There are no problems to display for this patient.   Past Surgical History:  Procedure Laterality Date  . APPENDECTOMY    . CHOLECYSTECTOMY      OB History   No obstetric history on file.      Home Medications    Prior to Admission medications   Medication Sig Start Date End Date Taking? Authorizing Provider  ALBUTEROL IN Inhale into the lungs.   Yes [provider]  benzonatate (TESSALON) 100 MG capsule Take by mouth 3 (three) times daily as needed for cough.   Yes [provider]  Fluticasone Propionate HFA (FLOVENT HFA IN) Inhale into the lungs.   Yes [provider]  hydroxychloroquine (PLAQUENIL) 200 MG tablet Take 200 mg by mouth daily.  03/14/18  Yes [provider]  Norethindrone-Ethinyl Estradiol-Fe Biphas (LO LOESTRIN FE) 1 MG-10 MCG / 10 MCG tablet Take 1 tablet by mouth daily.   Yes [provider]  ondansetron (ZOFRAN ODT) 8 MG disintegrating tablet 76m ODT q4 hours prn nausea 11/29/18  Yes Muthersbaugh, HJarrett Soho PA-C  sertraline (ZOLOFT) 25 MG tablet Take 25 mg by mouth daily.    Yes [provider]    chlorpheniramine-HYDROcodone (TUSSIONEX PENNKINETIC ER) 10-8 MG/5ML SUER Take 5 mLs by mouth every 12 (twelve) hours as needed for cough. 04/11/19   BLoura HaltA, NP  dicyclomine (BENTYL) 20 MG tablet Take 1 tablet (20 mg total) by mouth 2 (two) times daily. 11/29/18 04/11/19  Muthersbaugh, HJarrett Soho PA-C  diphenhydramine-acetaminophen (TYLENOL PM) 25-500 MG TABS tablet Take 2 tablets by mouth at bedtime as needed (sleep).  04/11/19  [provider]    Family History Family History  Problem Relation Age of Onset  . Hypertension Mother   . Hypercholesterolemia Mother   . Diabetes Father   . Hypertension Father   . Cancer Father     Social History Social History   Tobacco Use  . Smoking status: Never Smoker  . Smokeless tobacco: Never Used  Substance Use Topics  . Alcohol use: Never  . Drug use: Never     Allergies   Azithromycin, Amoxicillin, Fish allergy, Pantoprazole, and Peanut-containing drug products   Review of Systems Review of Systems   Physical Exam Triage Vital Signs ED Triage Vitals  Enc Vitals Group     BP 04/11/19 1317 135/84     Pulse Rate 04/11/19 1317 (!) 121     Resp 04/11/19 1317 18     Temp 04/11/19 1317 98.1 F (36.7 C)     Temp Source 04/11/19 1317  Oral     SpO2 04/11/19 1317 98 %     Weight 04/11/19 1314 200 lb (90.7 kg)     Height 04/11/19 1314 5' 2"  (1.575 m)     Head Circumference --      Peak Flow --      Pain Score 04/11/19 1314 10     Pain Loc --      Pain Edu? --      Excl. in [place]? --    No data found.  Updated Vital Signs BP 135/84 (BP Location: Right Arm)   Pulse (!) 121   Temp 98.1 F (36.7 C) (Oral)   Resp 18   Ht 5' 2"  (1.575 m)   Wt 200 lb (90.7 kg)   SpO2 98%   BMI 36.58 kg/m   Visual Acuity Right Eye Distance:   Left Eye Distance:   Bilateral Distance:    Right Eye Near:   Left Eye Near:    Bilateral Near:     Physical Exam Vitals and nursing note reviewed.  Constitutional:      General: She is  not in acute distress.    Appearance: Normal appearance. She is not ill-appearing, toxic-appearing or diaphoretic.  HENT:     Head: Normocephalic and atraumatic.     Right Ear: Tympanic membrane and ear canal normal.     Left Ear: Tympanic membrane and ear canal normal.     Nose: Nose normal.     Mouth/Throat:     Pharynx: Oropharynx is clear.  Eyes:     Conjunctiva/sclera: Conjunctivae normal.  Cardiovascular:     Rate and Rhythm: Tachycardia present.     Pulses: Normal pulses.     Heart sounds: Normal heart sounds.     Comments: Tachy around 110  Pulmonary:     Effort: Pulmonary effort is normal.     Breath sounds: Normal breath sounds.     Comments: Coughing during entire exam. Musculoskeletal:        General: Normal range of motion.  Skin:    General: Skin is warm and dry.  Neurological:     Mental Status: She is alert.  Psychiatric:        Mood and Affect: Mood normal.      UC Treatments / Results  Labs (all labs ordered are listed, but only abnormal results are displayed) Labs Reviewed  SARS CORONAVIRUS 2 (TAT 6-24 HRS)    EKG   Radiology No results found.  Procedures Procedures (including critical care time)  Medications Ordered in UC Medications - No data to display  Initial Impression / Assessment and Plan / UC Course  I have reviewed the triage vital signs and the nursing notes.  Pertinent labs & imaging results that were available during my care of the patient were reviewed by me and considered in my medical decision making (see chart for details).     Viral URI-most likely viral related. Covid swab sent for testing Over-the-counter medications as needed.  Prescribed a stronger cough medication to help rest. Follow up as needed for continued or worsening symptoms  Final Clinical Impressions(s) / UC Diagnoses   Final diagnoses:  Viral URI with cough     Discharge Instructions     This is most likely some sort of virus.  You can use the  cough medicine as prescribed. Over-the-counter medications as needed. We will call you if your Covid swab is positive    ED Prescriptions    Medication Sig Dispense  Auth. Provider   chlorpheniramine-HYDROcodone (TUSSIONEX PENNKINETIC ER) 10-8 MG/5ML SUER Take 5 mLs by mouth every 12 (twelve) hours as needed for cough. 70 mL Levern Pitter A, NP     PDMP not reviewed this encounter.   Orvan July, NP 04/11/19 1424

## 2019-04-11 NOTE — Discharge Instructions (Signed)
This is most likely some sort of virus.  You can use the cough medicine as prescribed. Over-the-counter medications as needed. We will call you if your Covid swab is positive

## 2019-05-05 DIAGNOSIS — J309 Allergic rhinitis, unspecified: Secondary | ICD-10-CM | POA: Insufficient documentation

## 2019-06-18 ENCOUNTER — Emergency Department (HOSPITAL_COMMUNITY)
Admission: EM | Admit: 2019-06-18 | Discharge: 2019-06-18 | Disposition: A | Discharge status: Home / Self Care | Payer: PPO | Attending: Emergency Medicine | Admitting: Emergency Medicine

## 2019-06-18 ENCOUNTER — Encounter (HOSPITAL_COMMUNITY): Payer: Self-pay | Admitting: Emergency Medicine

## 2019-06-18 DIAGNOSIS — Z5321 Procedure and treatment not carried out due to patient leaving prior to being seen by health care provider: Secondary | ICD-10-CM | POA: Insufficient documentation

## 2019-06-18 DIAGNOSIS — R42 Dizziness and giddiness: Secondary | ICD-10-CM | POA: Diagnosis present

## 2019-06-18 LAB — COMPREHENSIVE METABOLIC PANEL
ALT: 25 U/L (ref 0–44)
AST: 20 U/L (ref 15–41)
Albumin: 3.9 g/dL (ref 3.5–5.0)
Alkaline Phosphatase: 94 U/L (ref 38–126)
Anion gap: 14 (ref 5–15)
BUN: 9 mg/dL (ref 6–20)
CO2: 19 mmol/L — ABNORMAL LOW (ref 22–32)
Calcium: 9.6 mg/dL (ref 8.9–10.3)
Chloride: 108 mmol/L (ref 98–111)
Creatinine, Ser: 1.01 mg/dL — ABNORMAL HIGH (ref 0.44–1.00)
GFR calc Af Amer: >60 mL/min (ref >60)
GFR calc non Af Amer: >60 mL/min (ref >60)
Glucose, Bld: 110 mg/dL — ABNORMAL HIGH (ref 70–99)
Potassium: 4.1 mmol/L (ref 3.5–5.1)
Sodium: 141 mmol/L (ref 135–145)
Total Bilirubin: 0.4 mg/dL (ref 0.3–1.2)
Total Protein: 7.8 g/dL (ref 6.5–8.1)

## 2019-06-18 LAB — CBC WITH DIFFERENTIAL/PLATELET
Abs Immature Granulocytes: 0.03 10*3/uL (ref 0.00–0.07)
Basophils Absolute: 0 10*3/uL (ref 0.0–0.1)
Basophils Relative: 0 %
Eosinophils Absolute: 0.3 10*3/uL (ref 0.0–0.5)
Eosinophils Relative: 3 %
HCT: 40.8 % (ref 36.0–46.0)
Hemoglobin: 13 g/dL (ref 12.0–15.0)
Immature Granulocytes: 0 %
Lymphocytes Relative: 27 %
Lymphs Abs: 2.8 10*3/uL (ref 0.7–4.0)
MCH: 22.9 pg — ABNORMAL LOW (ref 26.0–34.0)
MCHC: 31.9 g/dL (ref 30.0–36.0)
MCV: 72 fL — ABNORMAL LOW (ref 80.0–100.0)
Monocytes Absolute: 0.7 10*3/uL (ref 0.1–1.0)
Monocytes Relative: 6 %
Neutro Abs: 6.7 10*3/uL (ref 1.7–7.7)
Neutrophils Relative %: 64 %
Platelets: 540 10*3/uL — ABNORMAL HIGH (ref 150–400)
RBC: 5.67 MIL/uL — ABNORMAL HIGH (ref 3.87–5.11)
RDW: 17.2 % — ABNORMAL HIGH (ref 11.5–15.5)
WBC: 10.5 10*3/uL (ref 4.0–10.5)
nRBC: 0 % (ref 0.0–0.2)

## 2019-06-18 LAB — I-STAT BETA HCG BLOOD, ED (MC, WL, AP ONLY): I-stat hCG, quantitative: <5 m[IU]/mL (ref <5)

## 2019-06-18 NOTE — ED Triage Notes (Signed)
Pt reports tonsilectomy on Friday, states for the past 3 days she has been feeling lightheaded when walking and had blood in her vomit. Pts voice hoarse, tried to Dentist but they are out of town this week.

## 2019-06-18 NOTE — ED Notes (Signed)
Pt stated she is leaving.

## 2019-10-18 ENCOUNTER — Ambulatory Visit (HOSPITAL_COMMUNITY): Payer: Self-pay

## 2019-10-26 ENCOUNTER — Ambulatory Visit (INDEPENDENT_AMBULATORY_CARE_PROVIDER_SITE_OTHER): Payer: PPO

## 2019-10-26 ENCOUNTER — Other Ambulatory Visit: Payer: Self-pay

## 2019-10-26 ENCOUNTER — Ambulatory Visit
Admission: EM | Admit: 2019-10-26 | Discharge: 2019-10-26 | Disposition: A | Discharge status: Home / Self Care | Payer: PPO | Attending: Emergency Medicine | Admitting: Emergency Medicine

## 2019-10-26 DIAGNOSIS — R059 Cough, unspecified: Secondary | ICD-10-CM

## 2019-10-26 DIAGNOSIS — R000 Tachycardia, unspecified: Secondary | ICD-10-CM

## 2019-10-26 MED ORDER — BENZONATATE 100 MG PO CAPS: 100.0000 mg | ORAL_CAPSULE | Freq: Three times a day (TID) | ORAL | 0 refills | Status: DC

## 2019-10-26 MED ORDER — PROMETHAZINE HCL 25 MG PO TABS: 25.0000 mg | ORAL_TABLET | Freq: Four times a day (QID) | ORAL | 0 refills | Status: DC | PRN

## 2019-10-26 MED ORDER — GUAIFENESIN-DM 100-10 MG/5ML PO SYRP: 5.0000 mL | ORAL_SOLUTION | ORAL | 0 refills | Status: DC | PRN

## 2019-10-26 MED ORDER — PREDNISONE 20 MG PO TABS: 20.0000 mg | ORAL_TABLET | Freq: Every day | ORAL | 0 refills | Status: DC

## 2019-10-26 NOTE — ED Triage Notes (Signed)
Pt states she has had a cough and fever x 10 days. Pt has had a covid test within a week, one at [facility] and one at [facility] and both were negative. PT is aox4 and ambulatory.

## 2019-10-26 NOTE — Discharge Instructions (Addendum)
Important push fluids (drink lots water). May take cough medication as directed. Very important to go to the emergency room if you are unable to keep down fluids, or develop chest pain, palpitations, vomiting, severe abdominal pain, change in urination, fever.

## 2019-10-26 NOTE — ED Provider Notes (Signed)
[facility]    CSN: [id number] Arrival date & time: 10/26/19  1834      History   Chief Complaint Chief Complaint  Patient presents with  . Cough    x 10 days  . Fever    x 10 days, today 101.8    HPI [name] is a 24 y.o. female  Presenting for persistent cough.  Patient previously evaluated in ER setting: Please see those records, reviewed by me at time of visit.  Endorsing subjective fever: Unknown T-max.  Has had multiple Covid test within the past week-negative.  Does endorse mild wheezing without known aggravating factors.  Denies shortness of breath, chest pain or palpitations.  Does endorse malaise.  No vomiting, abdominal pain, diarrhea.  No known sick contacts.  Past Medical History:  Diagnosis Date  . Asthma   . Celiac disease   . Endometriosis   . Lupus ([place])     There are no problems to display for this patient.   Past Surgical History:  Procedure Laterality Date  . APPENDECTOMY    . CHOLECYSTECTOMY      OB History   No obstetric history on file.      Home Medications    Prior to Admission medications   Medication Sig Start Date End Date Taking? Authorizing Provider  ALBUTEROL IN Inhale into the lungs.    [provider]  benzonatate (TESSALON) 100 MG capsule Take 1 capsule (100 mg total) by mouth every 8 (eight) hours. 10/26/19   Hall-Potvin, [place], PA-C  chlorpheniramine-HYDROcodone (TUSSIONEX PENNKINETIC ER) 10-8 MG/5ML SUER Take 5 mLs by mouth every 12 (twelve) hours as needed for cough. 04/11/19   Loura Halt A, NP  Fluticasone Propionate HFA (FLOVENT HFA IN) Inhale into the lungs.    [provider]  guaiFENesin-dextromethorphan (ROBITUSSIN DM) 100-10 MG/5ML syrup Take 5 mLs by mouth every 4 (four) hours as needed for cough. 10/26/19   Hall-Potvin, [place], PA-C  hydroxychloroquine (PLAQUENIL) 200 MG tablet Take 200 mg by mouth daily.  03/14/18   [provider]  Norethindrone-Ethinyl Estradiol-Fe  Biphas (LO LOESTRIN FE) 1 MG-10 MCG / 10 MCG tablet Take 1 tablet by mouth daily.    [provider]  ondansetron (ZOFRAN ODT) 8 MG disintegrating tablet 8mg  ODT q4 hours prn nausea 11/29/18   Muthersbaugh, Jarrett Soho, PA-C  predniSONE (DELTASONE) 20 MG tablet Take 1 tablet (20 mg total) by mouth daily. 10/26/19   Hall-Potvin, [place], PA-C  promethazine (PHENERGAN) 25 MG tablet Take 1 tablet (25 mg total) by mouth every 6 (six) hours as needed for up to 5 days for nausea or vomiting. 10/26/19 10/31/19  Hall-Potvin, [place], PA-C  sertraline (ZOLOFT) 25 MG tablet Take 25 mg by mouth daily.     [provider]  dicyclomine (BENTYL) 20 MG tablet Take 1 tablet (20 mg total) by mouth 2 (two) times daily. 11/29/18 04/11/19  Muthersbaugh, Jarrett Soho, PA-C  diphenhydramine-acetaminophen (TYLENOL PM) 25-500 MG TABS tablet Take 2 tablets by mouth at bedtime as needed (sleep).  04/11/19  [provider]    Family History Family History  Problem Relation Age of Onset  . Hypertension Mother   . Hypercholesterolemia Mother   . Diabetes Father   . Hypertension Father   . Cancer Father     Social History Social History   Tobacco Use  . Smoking status: Never Smoker  . Smokeless tobacco: Never Used  Vaping Use  . Vaping Use: Never used  Substance Use Topics  .  Alcohol use: Never  . Drug use: Never     Allergies   Azithromycin, Amoxicillin, Fish allergy, Pantoprazole, and Peanut-containing drug products   Review of Systems As per HPI   Physical Exam Triage Vital Signs ED Triage Vitals  Enc Vitals Group     BP 10/26/19 1943 (!) 144/104     Pulse Rate 10/26/19 1943 (!) 137     Resp 10/26/19 1943 18     Temp 10/26/19 1943 98.1 F (36.7 C)     Temp Source 10/26/19 1943 Oral     SpO2 10/26/19 1943 98 %     Weight --      Height --      Head Circumference --      Peak Flow --      Pain Score 10/26/19 1948 4     Pain Loc --      Pain Edu? --      Excl. in [place]? --     No data found.  Updated Vital Signs BP (!) 144/104 (BP Location: Left Arm)   Pulse (!) 137   Temp 98.1 F (36.7 C) (Oral)   Resp 18   LMP  (LMP Unknown)   SpO2 98%   Visual Acuity Right Eye Distance:   Left Eye Distance:   Bilateral Distance:    Right Eye Near:   Left Eye Near:    Bilateral Near:     Physical Exam Constitutional:      General: She is not in acute distress.    Appearance: She is not ill-appearing or diaphoretic.  HENT:     Head: Normocephalic and atraumatic.     Mouth/Throat:     Mouth: Mucous membranes are moist.     Pharynx: Oropharynx is clear. No oropharyngeal exudate or posterior oropharyngeal erythema.  Eyes:     General: No scleral icterus.    Conjunctiva/sclera: Conjunctivae normal.     Pupils: Pupils are equal, round, and reactive to light.  Neck:     Comments: Trachea midline, negative JVD Cardiovascular:     Rate and Rhythm: Regular rhythm. Tachycardia present.     Heart sounds: No murmur heard.  No gallop.   Pulmonary:     Effort: Pulmonary effort is normal. No respiratory distress.     Breath sounds: No wheezing, rhonchi or rales.  Musculoskeletal:     Cervical back: Neck supple. No tenderness.  Lymphadenopathy:     Cervical: No cervical adenopathy.  Skin:    Capillary Refill: Capillary refill takes less than 2 seconds.     Coloration: Skin is not jaundiced or pale.     Findings: No rash.  Neurological:     General: No focal deficit present.     Mental Status: She is alert and oriented to person, place, and time.      UC Treatments / Results  Labs (all labs ordered are listed, but only abnormal results are displayed) Labs Reviewed - No data to display  EKG   Radiology DG Chest 2 View  Result Date: 10/26/2019 CLINICAL DATA:  Cough. EXAM: CHEST - 2 VIEW COMPARISON:  None. FINDINGS: The cardiomediastinal contours are normal. The lungs are clear. Pulmonary vasculature is normal. No consolidation, pleural effusion, or  pneumothorax. No acute osseous abnormalities are seen. IMPRESSION: Negative radiographs of the chest. Electronically Signed   By: Keith Rake M.D.   On: 10/26/2019 20:17    Procedures Procedures (including critical care time)  Medications Ordered in UC Medications - No data  to display  Initial Impression / Assessment and Plan / UC Course  I have reviewed the triage vital signs and the nursing notes.  Pertinent labs & imaging results that were available during my care of the patient were reviewed by me and considered in my medical decision making (see chart for details).     Patient afebrile, nontoxic, with SpO2 98%.  EKG done office, reviewed by me without previous to compare.  Sinus tachycardia with ventricular rate of 134 bpm.  No QTC prolongation, ST elevation or depression.  External medical records reviewed extensively: Please see attached.  ER visit on 9/30 with unremarkable EKG (other than sinus tachycardia), normal chest x-ray.  Chest x-ray done in office, reviewed by me and radiology: Negative for acute process.  Will treat supportively as outlined below.  Return precautions discussed, patient verbalized understanding and is agreeable to plan. Final Clinical Impressions(s) / UC Diagnoses   Final diagnoses:  Cough  Tachycardia     Discharge Instructions     Important push fluids (drink lots water). May take cough medication as directed. Very important to go to the emergency room if you are unable to keep down fluids, or develop chest pain, palpitations, vomiting, severe abdominal pain, change in urination, fever.    ED Prescriptions    Medication Sig Dispense Auth. Provider   benzonatate (TESSALON) 100 MG capsule Take 1 capsule (100 mg total) by mouth every 8 (eight) hours. 21 capsule Hall-Potvin, [place], PA-C   predniSONE (DELTASONE) 20 MG tablet Take 1 tablet (20 mg total) by mouth daily. 5 tablet Hall-Potvin, [place], PA-C   promethazine (PHENERGAN) 25 MG  tablet Take 1 tablet (25 mg total) by mouth every 6 (six) hours as needed for up to 5 days for nausea or vomiting. 20 tablet Hall-Potvin, [place], PA-C   guaiFENesin-dextromethorphan (ROBITUSSIN DM) 100-10 MG/5ML syrup Take 5 mLs by mouth every 4 (four) hours as needed for cough. 118 mL Hall-Potvin, [place], PA-C     PDMP not reviewed this encounter.   [place], [place], [place] 10/27/19 [phone]

## 2019-11-13 ENCOUNTER — Emergency Department (HOSPITAL_COMMUNITY): Payer: PPO

## 2019-11-13 ENCOUNTER — Emergency Department (HOSPITAL_COMMUNITY)
Admission: EM | Admit: 2019-11-13 | Discharge: 2019-11-13 | Disposition: A | Discharge status: Home / Self Care | Payer: PPO | Attending: Emergency Medicine | Admitting: Emergency Medicine

## 2019-11-13 ENCOUNTER — Other Ambulatory Visit: Payer: Self-pay

## 2019-11-13 ENCOUNTER — Encounter (HOSPITAL_COMMUNITY): Payer: Self-pay | Admitting: *Deleted

## 2019-11-13 DIAGNOSIS — R1084 Generalized abdominal pain: Secondary | ICD-10-CM | POA: Diagnosis present

## 2019-11-13 DIAGNOSIS — J45909 Unspecified asthma, uncomplicated: Secondary | ICD-10-CM | POA: Diagnosis not present

## 2019-11-13 DIAGNOSIS — Z91010 Allergy to peanuts: Secondary | ICD-10-CM | POA: Insufficient documentation

## 2019-11-13 DIAGNOSIS — R109 Unspecified abdominal pain: Secondary | ICD-10-CM

## 2019-11-13 DIAGNOSIS — R197 Diarrhea, unspecified: Secondary | ICD-10-CM | POA: Diagnosis not present

## 2019-11-13 LAB — URINALYSIS, ROUTINE W REFLEX MICROSCOPIC
Bilirubin Urine: NEGATIVE
Glucose, UA: NEGATIVE mg/dL
Hgb urine dipstick: NEGATIVE
Ketones, ur: NEGATIVE mg/dL
Nitrite: NEGATIVE
Protein, ur: NEGATIVE mg/dL
Specific Gravity, Urine: 1.018 (ref 1.005–1.030)
pH: 5 (ref 5.0–8.0)

## 2019-11-13 LAB — CBC
HCT: 35.5 % — ABNORMAL LOW (ref 36.0–46.0)
Hemoglobin: 11 g/dL — ABNORMAL LOW (ref 12.0–15.0)
MCH: 22.8 pg — ABNORMAL LOW (ref 26.0–34.0)
MCHC: 31 g/dL (ref 30.0–36.0)
MCV: 73.7 fL — ABNORMAL LOW (ref 80.0–100.0)
Platelets: 394 10*3/uL (ref 150–400)
RBC: 4.82 MIL/uL (ref 3.87–5.11)
RDW: 17 % — ABNORMAL HIGH (ref 11.5–15.5)
WBC: 5.5 10*3/uL (ref 4.0–10.5)
nRBC: 0 % (ref 0.0–0.2)

## 2019-11-13 LAB — COMPREHENSIVE METABOLIC PANEL
ALT: 12 U/L (ref 0–44)
AST: 15 U/L (ref 15–41)
Albumin: 3.7 g/dL (ref 3.5–5.0)
Alkaline Phosphatase: 70 U/L (ref 38–126)
Anion gap: 10 (ref 5–15)
BUN: 7 mg/dL (ref 6–20)
CO2: 19 mmol/L — ABNORMAL LOW (ref 22–32)
Calcium: 8.5 mg/dL — ABNORMAL LOW (ref 8.9–10.3)
Chloride: 109 mmol/L (ref 98–111)
Creatinine, Ser: 0.7 mg/dL (ref 0.44–1.00)
GFR, Estimated: >60 mL/min (ref >60)
Glucose, Bld: 99 mg/dL (ref 70–99)
Potassium: 3.8 mmol/L (ref 3.5–5.1)
Sodium: 138 mmol/L (ref 135–145)
Total Bilirubin: 0.3 mg/dL (ref 0.3–1.2)
Total Protein: 6.9 g/dL (ref 6.5–8.1)

## 2019-11-13 LAB — I-STAT BETA HCG BLOOD, ED (MC, WL, AP ONLY): I-stat hCG, quantitative: <5 m[IU]/mL (ref <5)

## 2019-11-13 LAB — LIPASE, BLOOD: Lipase: 37 U/L (ref 11–51)

## 2019-11-13 MED ORDER — SODIUM CHLORIDE 0.9 % IV BOLUS (SEPSIS)
1000.0000 mL | Freq: Once | INTRAVENOUS | Status: AC
  Administered 2019-11-13: 1000 mL via INTRAVENOUS

## 2019-11-13 MED ORDER — SODIUM CHLORIDE 0.9 % IV SOLN
1000.0000 mL | INTRAVENOUS | Status: DC
  Administered 2019-11-13: 1000 mL via INTRAVENOUS

## 2019-11-13 MED ORDER — PROMETHAZINE HCL 25 MG RE SUPP: 25.0000 mg | Freq: Four times a day (QID) | RECTAL | 0 refills | Status: DC | PRN

## 2019-11-13 MED ORDER — MORPHINE SULFATE (PF) 4 MG/ML IV SOLN
4.0000 mg | Freq: Once | INTRAVENOUS | Status: AC
  Administered 2019-11-13: 4 mg via INTRAVENOUS
  Filled 2019-11-13: qty 1

## 2019-11-13 MED ORDER — ONDANSETRON HCL 4 MG/2ML IJ SOLN
4.0000 mg | Freq: Once | INTRAMUSCULAR | Status: AC
  Administered 2019-11-13: 4 mg via INTRAVENOUS
  Filled 2019-11-13: qty 2

## 2019-11-13 MED ORDER — IOHEXOL 300 MG/ML  SOLN: 100.0000 mL | Freq: Once | INTRAMUSCULAR | Status: DC | PRN

## 2019-11-13 MED ORDER — DIPHENHYDRAMINE HCL 50 MG/ML IJ SOLN
25.0000 mg | Freq: Once | INTRAMUSCULAR | Status: AC
  Administered 2019-11-13: 25 mg via INTRAVENOUS
  Filled 2019-11-13: qty 1

## 2019-11-13 NOTE — ED Provider Notes (Signed)
[facility] DEPT Provider Note   CSN: [id number] Arrival date & time: 11/13/19  1021     History Chief Complaint  Patient presents with  . Abdominal Pain    [name] is a 24 y.o. female.  HPI   Pt has been having abdominal pain and diarrhea.  Her sx started three weeks ago but the severe pain has been 3-4 days ago.  The pain is diffuse upper and lower and both sides.  Pt has had blood in her stool.   She also has been vomiting and noticed blood.  She also has noticed blood in her urine. SHe saw her doctor yesterday and plans were for a colonoscopy and endoscopy.  Pt felt worse today.  She has been lying in bed without relief.    Past Medical History:  Diagnosis Date  . Asthma   . Celiac disease   . Endometriosis   . Lupus ([place])     There are no problems to display for this patient.   Past Surgical History:  Procedure Laterality Date  . APPENDECTOMY    . CHOLECYSTECTOMY       OB History   No obstetric history on file.     Family History  Problem Relation Age of Onset  . Hypertension Mother   . Hypercholesterolemia Mother   . Diabetes Father   . Hypertension Father   . Cancer Father     Social History   Tobacco Use  . Smoking status: Never Smoker  . Smokeless tobacco: Never Used  Vaping Use  . Vaping Use: Never used  Substance Use Topics  . Alcohol use: Never  . Drug use: Never    Home Medications Prior to Admission medications   Medication Sig Start Date End Date Taking? Authorizing Provider  ALBUTEROL IN Inhale into the lungs.    [provider]  benzonatate (TESSALON) 100 MG capsule Take 1 capsule (100 mg total) by mouth every 8 (eight) hours. 10/26/19   Hall-Potvin, [place], PA-C  chlorpheniramine-HYDROcodone (TUSSIONEX PENNKINETIC ER) 10-8 MG/5ML SUER Take 5 mLs by mouth every 12 (twelve) hours as needed for cough. 04/11/19   Loura Halt A, NP  Fluticasone Propionate HFA (FLOVENT HFA IN) Inhale into the  lungs.    [provider]  guaiFENesin-dextromethorphan (ROBITUSSIN DM) 100-10 MG/5ML syrup Take 5 mLs by mouth every 4 (four) hours as needed for cough. 10/26/19   Hall-Potvin, [place], PA-C  hydroxychloroquine (PLAQUENIL) 200 MG tablet Take 200 mg by mouth daily.  03/14/18   [provider]  Norethindrone-Ethinyl Estradiol-Fe Biphas (LO LOESTRIN FE) 1 MG-10 MCG / 10 MCG tablet Take 1 tablet by mouth daily.    [provider]  ondansetron (ZOFRAN ODT) 8 MG disintegrating tablet 86m ODT q4 hours prn nausea 11/29/18   Muthersbaugh, HJarrett Soho PA-C  predniSONE (DELTASONE) 20 MG tablet Take 1 tablet (20 mg total) by mouth daily. 10/26/19   Hall-Potvin, [place] PA-C  promethazine (PHENERGAN) 25 MG suppository Place 1 suppository (25 mg total) rectally every 6 (six) hours as needed for nausea or vomiting. 11/13/19   KDorie Rank MD  sertraline (ZOLOFT) 25 MG tablet Take 25 mg by mouth daily.     [provider]  dicyclomine (BENTYL) 20 MG tablet Take 1 tablet (20 mg total) by mouth 2 (two) times daily. 11/29/18 04/11/19  Muthersbaugh, HJarrett Soho PA-C  diphenhydramine-acetaminophen (TYLENOL PM) 25-500 MG TABS tablet Take 2 tablets by mouth at bedtime as needed (sleep).  04/11/19  [provider]  Allergies    Azithromycin, Contrast media [iodinated diagnostic agents], Amoxicillin, Fish allergy, Pantoprazole, and Peanut-containing drug products  Review of Systems   Review of Systems  All other systems reviewed and are negative.   Physical Exam Updated Vital Signs BP 105/74   Pulse 83   Temp 98.2 F (36.8 C) (Oral)   Resp 16   Ht 1.575 m (5' 2" )   Wt 108.9 kg   LMP  (LMP Unknown) Comment: negative HCG blood test 11-13-2019  SpO2 100%   BMI 43.90 kg/m   Physical Exam Vitals and nursing note reviewed.  Constitutional:      General: She is not in acute distress.    Appearance: She is well-developed.  HENT:     Head: Normocephalic and atraumatic.      Right Ear: External ear normal.     Left Ear: External ear normal.  Eyes:     General: No scleral icterus.       Right eye: No discharge.        Left eye: No discharge.     Conjunctiva/sclera: Conjunctivae normal.  Neck:     Trachea: No tracheal deviation.  Cardiovascular:     Rate and Rhythm: Normal rate and regular rhythm.  Pulmonary:     Effort: Pulmonary effort is normal. No respiratory distress.     Breath sounds: Normal breath sounds. No stridor. No wheezing or rales.  Abdominal:     General: Bowel sounds are normal. There is no distension.     Palpations: Abdomen is soft.     Tenderness: There is generalized abdominal tenderness. There is no guarding or rebound.  Musculoskeletal:        General: No tenderness.     Cervical back: Neck supple.  Skin:    General: Skin is warm and dry.     Findings: No rash.  Neurological:     Mental Status: She is alert.     Cranial Nerves: No cranial nerve deficit (no facial droop, extraocular movements intact, no slurred speech).     Sensory: No sensory deficit.     Motor: No abnormal muscle tone or seizure activity.     Coordination: Coordination normal.     ED Results / Procedures / Treatments   Labs (all labs ordered are listed, but only abnormal results are displayed) Labs Reviewed  COMPREHENSIVE METABOLIC PANEL - Abnormal; Notable for the following components:      Result Value   CO2 19 (*)    Calcium 8.5 (*)    All other components within normal limits  CBC - Abnormal; Notable for the following components:   Hemoglobin 11.0 (*)    HCT 35.5 (*)    MCV 73.7 (*)    MCH 22.8 (*)    RDW 17.0 (*)    All other components within normal limits  LIPASE, BLOOD  URINALYSIS, ROUTINE W REFLEX MICROSCOPIC  I-STAT BETA HCG BLOOD, ED ([facility], [facility], [facility] ONLY)    EKG None  Radiology CT ABDOMEN PELVIS WO CONTRAST  Result Date: 11/13/2019 CLINICAL DATA:  Abdominal pain EXAM: CT ABDOMEN AND PELVIS WITHOUT CONTRAST TECHNIQUE: Multidetector  CT imaging of the abdomen and pelvis was performed following the standard protocol without IV contrast. COMPARISON:  2020 FINDINGS: Lower chest: No acute abnormality. Hepatobiliary: Too small to characterize hypoattenuating lesion of the right hepatic lobe. Post cholecystectomy. No unexpected biliary dilatation. Pancreas: Unremarkable. Spleen: Unremarkable. Adrenals/Urinary Tract: Adrenals, kidneys, and bladder are unremarkable. Stomach/Bowel: Stomach is within normal limits. Bowel is  normal in caliber. Appendix is not seen and may be surgically absent. Vascular/Lymphatic: No significant vascular findings. No enlarged lymph nodes identified. Reproductive: Uterus is unremarkable.  No adnexal mass. Other: No ascites. No significant abnormality of the abdominal wall. Musculoskeletal: No acute or significant osseous abnormality. IMPRESSION: No acute abnormality or findings to account for reported symptoms. Electronically Signed   By: Macy Mis M.D.   On: 11/13/2019 15:12    Procedures Procedures (including critical care time)  Medications Ordered in ED Medications  sodium chloride 0.9 % bolus 1,000 mL (1,000 mLs Intravenous New Bag/Given 11/13/19 1248)    Followed by  0.9 %  sodium chloride infusion (1,000 mLs Intravenous New Bag/Given 11/13/19 1519)  morphine 4 MG/ML injection 4 mg (4 mg Intravenous Given 11/13/19 1255)  ondansetron (ZOFRAN) injection 4 mg (4 mg Intravenous Given 11/13/19 1254)  diphenhydrAMINE (BENADRYL) injection 25 mg (25 mg Intravenous Given 11/13/19 1253)    ED Course  I have reviewed the triage vital signs and the nursing notes.  Pertinent labs & imaging results that were available during my care of the patient were reviewed by me and considered in my medical decision making (see chart for details).    MDM Rules/Calculators/A&P                          Patient presented to the ED for evaluation of persistent abdominal pain.  Patient has been seeing GI and has been  told she might have Crohn's or possibly ulcerative colitis.  Patient states she has had persistent episodes of emesis as well as diarrhea and has noticed some blood in her stool.  While in the ED she did not have any episodes of hematemesis or hematochezia.  Patient's laboratory tests are reassuring.  Patient CT scan does not show any acute findings.  Patient improved with IV fluids pain medications and antiemetics.  Patient is ready for discharge.  Discussed outpatient follow-up with her Final Clinical Impression(s) / ED Diagnoses Final diagnoses:  Abdominal pain, unspecified abdominal location  Diarrhea, unspecified type    Rx / DC Orders ED Discharge Orders         Ordered    promethazine (PHENERGAN) 25 MG suppository  Every 6 hours PRN        11/13/19 1523           Dorie Rank, MD 11/13/19 1525

## 2019-11-13 NOTE — Discharge Instructions (Addendum)
Continue your current medications.  Follow-up with your GI doctor for further evaluation.  Return to the ED as needed for worsening symptoms

## 2019-11-13 NOTE — ED Triage Notes (Signed)
Pt states she has been told she may have Ulcerative Colitis, severe abd pain. Yesterday saw GI and thinks it may be Chron's. Pt states she has diarrhea and blood in emesis, tired, abd cramping.

## 2019-11-14 ENCOUNTER — Other Ambulatory Visit: Payer: Self-pay

## 2019-11-14 ENCOUNTER — Encounter (HOSPITAL_COMMUNITY): Payer: Self-pay | Admitting: Emergency Medicine

## 2019-11-14 ENCOUNTER — Emergency Department (HOSPITAL_COMMUNITY)
Admission: EM | Admit: 2019-11-14 | Discharge: 2019-11-14 | Disposition: A | Discharge status: Home / Self Care | Payer: PPO | Attending: Emergency Medicine | Admitting: Emergency Medicine

## 2019-11-14 DIAGNOSIS — R197 Diarrhea, unspecified: Secondary | ICD-10-CM | POA: Diagnosis not present

## 2019-11-14 DIAGNOSIS — Z91010 Allergy to peanuts: Secondary | ICD-10-CM | POA: Insufficient documentation

## 2019-11-14 DIAGNOSIS — R112 Nausea with vomiting, unspecified: Secondary | ICD-10-CM | POA: Diagnosis not present

## 2019-11-14 DIAGNOSIS — J45909 Unspecified asthma, uncomplicated: Secondary | ICD-10-CM | POA: Diagnosis not present

## 2019-11-14 DIAGNOSIS — K920 Hematemesis: Secondary | ICD-10-CM | POA: Diagnosis present

## 2019-11-14 DIAGNOSIS — R1084 Generalized abdominal pain: Secondary | ICD-10-CM | POA: Diagnosis not present

## 2019-11-14 DIAGNOSIS — R109 Unspecified abdominal pain: Secondary | ICD-10-CM

## 2019-11-14 DIAGNOSIS — Z7951 Long term (current) use of inhaled steroids: Secondary | ICD-10-CM | POA: Insufficient documentation

## 2019-11-14 LAB — CBC
HCT: 34.8 % — ABNORMAL LOW (ref 36.0–46.0)
Hemoglobin: 10.9 g/dL — ABNORMAL LOW (ref 12.0–15.0)
MCH: 23.1 pg — ABNORMAL LOW (ref 26.0–34.0)
MCHC: 31.3 g/dL (ref 30.0–36.0)
MCV: 73.9 fL — ABNORMAL LOW (ref 80.0–100.0)
Platelets: 364 10*3/uL (ref 150–400)
RBC: 4.71 MIL/uL (ref 3.87–5.11)
RDW: 17 % — ABNORMAL HIGH (ref 11.5–15.5)
WBC: 7.7 10*3/uL (ref 4.0–10.5)
nRBC: 0 % (ref 0.0–0.2)

## 2019-11-14 LAB — COMPREHENSIVE METABOLIC PANEL
ALT: 21 U/L (ref 0–44)
AST: 21 U/L (ref 15–41)
Albumin: 3.7 g/dL (ref 3.5–5.0)
Alkaline Phosphatase: 86 U/L (ref 38–126)
Anion gap: 10 (ref 5–15)
BUN: 7 mg/dL (ref 6–20)
CO2: 22 mmol/L (ref 22–32)
Calcium: 8.9 mg/dL (ref 8.9–10.3)
Chloride: 109 mmol/L (ref 98–111)
Creatinine, Ser: 0.79 mg/dL (ref 0.44–1.00)
GFR, Estimated: >60 mL/min (ref >60)
Glucose, Bld: 91 mg/dL (ref 70–99)
Potassium: 3.7 mmol/L (ref 3.5–5.1)
Sodium: 141 mmol/L (ref 135–145)
Total Bilirubin: 0.4 mg/dL (ref 0.3–1.2)
Total Protein: 6.7 g/dL (ref 6.5–8.1)

## 2019-11-14 MED ORDER — HYDROMORPHONE HCL 1 MG/ML IJ SOLN
0.5000 mg | Freq: Once | INTRAMUSCULAR | Status: AC
  Administered 2019-11-14: 0.5 mg via INTRAVENOUS
  Filled 2019-11-14: qty 1

## 2019-11-14 MED ORDER — PROMETHAZINE HCL 25 MG PO TABS
25.0000 mg | ORAL_TABLET | Freq: Once | ORAL | Status: AC
  Administered 2019-11-14: 25 mg via ORAL
  Filled 2019-11-14: qty 1

## 2019-11-14 MED ORDER — PROMETHAZINE HCL 25 MG/ML IJ SOLN
25.0000 mg | Freq: Once | INTRAMUSCULAR | Status: AC
  Administered 2019-11-14: 25 mg via INTRAVENOUS
  Filled 2019-11-14: qty 1

## 2019-11-14 MED ORDER — LOPERAMIDE HCL 2 MG PO CAPS
4.0000 mg | ORAL_CAPSULE | Freq: Once | ORAL | Status: AC
  Administered 2019-11-14: 4 mg via ORAL
  Filled 2019-11-14: qty 2

## 2019-11-14 MED ORDER — DIPHENHYDRAMINE HCL 50 MG/ML IJ SOLN
25.0000 mg | Freq: Once | INTRAMUSCULAR | Status: AC
  Administered 2019-11-14: 25 mg via INTRAVENOUS
  Filled 2019-11-14: qty 1

## 2019-11-14 NOTE — Progress Notes (Signed)
TOC CM chart reviewed and pt does not have a PCP, showing insurance. [facility] ED TOC CM [phone]

## 2019-11-14 NOTE — Discharge Instructions (Signed)
Please read and follow all provided instructions.  Your diagnoses today include:  1. Nausea vomiting and diarrhea   2. Abdominal cramping     Tests performed today include:  Blood counts and electrolytes  Blood tests to check liver and kidney function  Vital signs. See below for your results today.   Medications prescribed:   None  Take any prescribed medications only as directed.  Home care instructions:   Follow any educational materials contained in this packet.   Drink clear liquids for the next 24 hours and introduce solid foods slowly after 24 hours using the b.r.a.t. diet (Bananas, Rice, Applesauce, Toast, Yogurt).    Follow-up instructions: Please follow-up with your primary care provider for further evaluation of your symptoms.   Return instructions:  SEEK IMMEDIATE MEDICAL ATTENTION IF:  If you have pain that does not go away or becomes severe   A temperature above 101F develops   Repeated vomiting occurs (multiple episodes)   If you have pain that becomes localized to portions of the abdomen. The right side could possibly be appendicitis. In an adult, the left lower portion of the abdomen could be colitis or diverticulitis.   Blood is being passed in stools or vomit (bright red or black tarry stools)   You develop chest pain, difficulty breathing, dizziness or fainting, or become confused, poorly responsive, or inconsolable (young children)  If you have any other emergent concerns regarding your health  Additional Information: Abdominal (belly) pain can be caused by many things. Your caregiver performed an examination and possibly ordered blood/urine tests and imaging (CT scan, x-rays, ultrasound). Many cases can be observed and treated at home after initial evaluation in the emergency department. Even though you are being discharged home, abdominal pain can be unpredictable. Therefore, you need a repeated exam if your pain does not resolve, returns, or  worsens. Most patients with abdominal pain don't have to be admitted to the hospital or have surgery, but serious problems like appendicitis and gallbladder attacks can start out as nonspecific pain. Many abdominal conditions cannot be diagnosed in one visit, so follow-up evaluations are very important.  Your vital signs today were: BP 119/79 (BP Location: Right Arm)   Pulse 87   Temp 98.7 F (37.1 C) (Oral)   Resp 17   Ht 5' 2"  (1.575 m)   Wt 108.9 kg   LMP  (LMP Unknown) Comment: negative HCG blood test 11-13-2019  SpO2 97%   BMI 43.90 kg/m  If your blood pressure (bp) was elevated above 135/85 this visit, please have this repeated by your doctor within one month. --------------

## 2019-11-14 NOTE — ED Provider Notes (Signed)
[place] DEPT Provider Note   CSN: [id number] Arrival date & time: 11/14/19  1037     History Chief Complaint  Patient presents with  . Hematemesis  . Diarrhea    [name] is a 24 y.o. female.  Patient presents to the emergency department for evaluation of ongoing vomiting and diarrhea both containing blood.  Patient was seen in the emergency department yesterday and had labs showing hemoglobin of 11 and a CT scan without any acute findings.  Patient states that she has had milder but similar symptoms in the past and she has had evaluation by GI at [facility].  She states that she is due for a colonoscopy and endoscopy in approximately 1 month due to concerns for Crohn's disease and ulcerative colitis.  He also states that she anticipates having a capsule endoscopy performed.  She was discharged home last night with Phenergan suppositories which she has not picked up.  Last night she had multiple episodes of vomiting and diarrhea along with uncontrolled abdominal pain.  She denies urinary symptoms, fever, chest pain, cough, shortness of breath.  She is a Art gallery manager.        Past Medical History:  Diagnosis Date  . Asthma   . Celiac disease   . Endometriosis   . Lupus ([place])     There are no problems to display for this patient.   Past Surgical History:  Procedure Laterality Date  . APPENDECTOMY    . CHOLECYSTECTOMY       OB History   No obstetric history on file.     Family History  Problem Relation Age of Onset  . Hypertension Mother   . Hypercholesterolemia Mother   . Diabetes Father   . Hypertension Father   . Cancer Father     Social History   Tobacco Use  . Smoking status: Never Smoker  . Smokeless tobacco: Never Used  Vaping Use  . Vaping Use: Never used  Substance Use Topics  . Alcohol use: Never  . Drug use: Never    Home Medications Prior to Admission medications   Medication Sig Start  Date End Date Taking? Authorizing Provider  ALBUTEROL IN Inhale 2 puffs into the lungs every 4 (four) hours as needed. Shortness of breath   Yes [provider]  ALPRAZolam (XANAX) 0.5 MG tablet Take 0.25-0.5 mg by mouth daily as needed for anxiety.   Yes [provider]  diphenhydramine-acetaminophen (TYLENOL PM) 25-500 MG TABS tablet Take 1 tablet by mouth at bedtime as needed.   Yes [provider]  hydroxychloroquine (PLAQUENIL) 200 MG tablet Take 400 mg by mouth daily.  03/14/18  Yes [provider]  Norethindrone-Ethinyl Estradiol-Fe Biphas (LO LOESTRIN FE) 1 MG-10 MCG / 10 MCG tablet Take 1 tablet by mouth daily.   Yes [provider]  promethazine (PHENERGAN) 25 MG suppository Place 1 suppository (25 mg total) rectally every 6 (six) hours as needed for nausea or vomiting. 11/13/19  Yes Dorie Rank, MD  sertraline (ZOLOFT) 25 MG tablet Take 25 mg by mouth daily.    Yes [provider]  zolpidem (AMBIEN) 10 MG tablet Take 10 mg by mouth at bedtime as needed for sleep.   Yes [provider]  benzonatate (TESSALON) 100 MG capsule Take 1 capsule (100 mg total) by mouth every 8 (eight) hours. Patient not taking: Reported on 11/14/2019 10/26/19   Hall-Potvin, [place], PA-C  chlorpheniramine-HYDROcodone (TUSSIONEX PENNKINETIC ER) 10-8 MG/5ML SUER Take 5  mLs by mouth every 12 (twelve) hours as needed for cough. Patient not taking: Reported on 11/14/2019 04/11/19   Loura Halt A, NP  Fluticasone Propionate HFA (FLOVENT HFA IN) Inhale into the lungs. Patient not taking: Reported on 11/14/2019    [provider]  guaiFENesin-dextromethorphan (ROBITUSSIN DM) 100-10 MG/5ML syrup Take 5 mLs by mouth every 4 (four) hours as needed for cough. Patient not taking: Reported on 11/14/2019 10/26/19   Hall-Potvin, [place], PA-C  ondansetron (ZOFRAN ODT) 8 MG disintegrating tablet 58m ODT q4 hours prn nausea Patient not taking: Reported on  11/14/2019 11/29/18   Muthersbaugh, HJarrett Soho PA-C  predniSONE (DELTASONE) 20 MG tablet Take 1 tablet (20 mg total) by mouth daily. Patient not taking: Reported on 11/14/2019 10/26/19   Hall-Potvin, [place] PA-C  dicyclomine (BENTYL) 20 MG tablet Take 1 tablet (20 mg total) by mouth 2 (two) times daily. 11/29/18 04/11/19  Muthersbaugh, HJarrett Soho PA-C    Allergies    Azithromycin, Contrast media [iodinated diagnostic agents], Amoxicillin, Fish allergy, Pantoprazole, and Peanut-containing drug products  Review of Systems   Review of Systems  Constitutional: Negative for chills and fever.  HENT: Negative for rhinorrhea and sore throat.   Eyes: Negative for redness.  Respiratory: Negative for cough and shortness of breath.   Cardiovascular: Negative for chest pain.  Gastrointestinal: Positive for abdominal pain, blood in stool, diarrhea, nausea and vomiting. Negative for rectal pain.  Genitourinary: Negative for dysuria, frequency, hematuria and urgency.  Musculoskeletal: Negative for myalgias.  Skin: Negative for rash.  Neurological: Negative for headaches.    Physical Exam Updated Vital Signs BP (!) 136/93   Pulse 82   Temp 98.7 F (37.1 C) (Oral)   Resp 18   Ht 5' 2"  (1.575 m)   Wt 108.9 kg   LMP  (LMP Unknown) Comment: negative HCG blood test 11-13-2019  SpO2 100%   BMI 43.90 kg/m   Physical Exam Vitals and nursing note reviewed.  Constitutional:      General: She is not in acute distress.    Appearance: She is well-developed.  HENT:     Head: Normocephalic and atraumatic.     Right Ear: External ear normal.     Left Ear: External ear normal.     Nose: Nose normal.  Eyes:     Conjunctiva/sclera: Conjunctivae normal.  Cardiovascular:     Rate and Rhythm: Normal rate and regular rhythm.     Heart sounds: No murmur heard.   Pulmonary:     Effort: No respiratory distress.     Breath sounds: No wheezing, rhonchi or rales.  Abdominal:     Palpations: Abdomen is soft.      Tenderness: There is abdominal tenderness. There is no guarding or rebound.     Comments: Mild to moderate generalized abdominal tenderness, nonfocal  Musculoskeletal:     Cervical back: Normal range of motion and neck supple.     Right lower leg: No edema.     Left lower leg: No edema.  Skin:    General: Skin is warm and dry.     Findings: No rash.  Neurological:     General: No focal deficit present.     Mental Status: She is alert. Mental status is at baseline.     Motor: No weakness.  Psychiatric:        Mood and Affect: Mood normal.     ED Results / Procedures / Treatments   Labs (all labs ordered are listed, but only abnormal results  are displayed) Labs Reviewed  COMPREHENSIVE METABOLIC PANEL  CBC    EKG None  Radiology CT ABDOMEN PELVIS WO CONTRAST  Result Date: 11/13/2019 CLINICAL DATA:  Abdominal pain EXAM: CT ABDOMEN AND PELVIS WITHOUT CONTRAST TECHNIQUE: Multidetector CT imaging of the abdomen and pelvis was performed following the standard protocol without IV contrast. COMPARISON:  2020 FINDINGS: Lower chest: No acute abnormality. Hepatobiliary: Too small to characterize hypoattenuating lesion of the right hepatic lobe. Post cholecystectomy. No unexpected biliary dilatation. Pancreas: Unremarkable. Spleen: Unremarkable. Adrenals/Urinary Tract: Adrenals, kidneys, and bladder are unremarkable. Stomach/Bowel: Stomach is within normal limits. Bowel is normal in caliber. Appendix is not seen and may be surgically absent. Vascular/Lymphatic: No significant vascular findings. No enlarged lymph nodes identified. Reproductive: Uterus is unremarkable.  No adnexal mass. Other: No ascites. No significant abnormality of the abdominal wall. Musculoskeletal: No acute or significant osseous abnormality. IMPRESSION: No acute abnormality or findings to account for reported symptoms. Electronically Signed   By: Macy Mis M.D.   On: 11/13/2019 15:12    Procedures Procedures  (including critical care time)  Medications Ordered in ED Medications  promethazine (PHENERGAN) injection 25 mg (has no administration in time range)  diphenhydrAMINE (BENADRYL) injection 25 mg (has no administration in time range)  HYDROmorphone (DILAUDID) injection 0.5 mg (has no administration in time range)    ED Course  I have reviewed the triage vital signs and the nursing notes.  Pertinent labs & imaging results that were available during my care of the patient were reviewed by me and considered in my medical decision making (see chart for details).  Patient seen and examined.  Reviewed work-up from yesterday.  Will check CBC and CMP to ensure stability given ongoing symptoms and bleeding.  Patient looks well at the current time.  She is hemodynamically stable.  Discussed with patient that at current time, evaluation will focus on symptom control.  Discussed importance of GI follow-up.  Vital signs reviewed and are as follows: BP (!) 136/93   Pulse 82   Temp 98.7 F (37.1 C) (Oral)   Resp 18   Ht 5' 2"  (1.575 m)   Wt 108.9 kg   LMP  (LMP Unknown) Comment: negative HCG blood test 11-13-2019  SpO2 100%   BMI 43.90 kg/m   5:08 PM blood counts are stable.  Patient has been improving with medications here.  She has received oral Imodium and Phenergan prior to discharge.  We will discharge to home.  Patient counseled to keep GI follow-up.  She will pick up Phenergan suppositories which were previously prescribed.  The patient was urged to return to the Emergency Department immediately with worsening of current symptoms, worsening abdominal pain, persistent vomiting, blood noted in stools, fever, or any other concerns. The patient verbalized understanding.     MDM Rules/Calculators/A&P                          Patient with abdominal pain, N/V/D. Vitals are stable, no fever. Labs stable. Imaging performed yesterday was negative, no indication for repeat imaging today. No signs  of dehydration, patient is now tolerating PO's. Lungs are clear and no signs suggestive of PNA. Low concern for appendicitis, cholecystitis, pancreatitis, ruptured viscus, UTI, kidney stone, aortic dissection, aortic aneurysm or other emergent abdominal etiology. Supportive therapy indicated with return if symptoms worsen.   Final Clinical Impression(s) / ED Diagnoses Final diagnoses:  Nausea vomiting and diarrhea  Abdominal cramping    Rx /  DC Orders ED Discharge Orders    None       Carlisle Cater, Hershal Coria 11/14/19 1709    Daleen Bo, MD 11/15/19 [phone]

## 2019-11-14 NOTE — ED Triage Notes (Signed)
Pt states that she has had N/V/D X 4 days and has a hx of the same. Blood in vomit. Hemoglobin was low yesterday. Questioning colitis vs chron's disease at her GI. Alert and oriented.

## 2019-11-14 NOTE — ED Notes (Addendum)
Pt given water and able to keep it down, but reports that her stomach started grumbling and she was worried she was going to start having diarrhea.

## 2019-11-14 NOTE — ED Notes (Signed)
Pt. Unable to urinate at this time. Will collect urine when pt. Voids. Nurse aware.  

## 2019-12-30 ENCOUNTER — Emergency Department (HOSPITAL_COMMUNITY)
Admission: EM | Admit: 2019-12-30 | Discharge: 2019-12-30 | Disposition: A | Discharge status: Home / Self Care | Payer: PPO | Attending: Emergency Medicine | Admitting: Emergency Medicine

## 2019-12-30 ENCOUNTER — Encounter (HOSPITAL_COMMUNITY): Payer: Self-pay | Admitting: Emergency Medicine

## 2019-12-30 ENCOUNTER — Other Ambulatory Visit: Payer: Self-pay

## 2019-12-30 DIAGNOSIS — K5190 Ulcerative colitis, unspecified, without complications: Secondary | ICD-10-CM | POA: Diagnosis not present

## 2019-12-30 DIAGNOSIS — Z79899 Other long term (current) drug therapy: Secondary | ICD-10-CM | POA: Insufficient documentation

## 2019-12-30 DIAGNOSIS — R519 Headache, unspecified: Secondary | ICD-10-CM | POA: Insufficient documentation

## 2019-12-30 DIAGNOSIS — R109 Unspecified abdominal pain: Secondary | ICD-10-CM | POA: Insufficient documentation

## 2019-12-30 DIAGNOSIS — R197 Diarrhea, unspecified: Secondary | ICD-10-CM | POA: Diagnosis not present

## 2019-12-30 DIAGNOSIS — Z91010 Allergy to peanuts: Secondary | ICD-10-CM | POA: Diagnosis not present

## 2019-12-30 DIAGNOSIS — R110 Nausea: Secondary | ICD-10-CM | POA: Insufficient documentation

## 2019-12-30 HISTORY — DX: Ulcerative colitis, unspecified, without complications: K51.90

## 2019-12-30 HISTORY — DX: Unspecified ovarian cyst, unspecified side: N83.209

## 2019-12-30 LAB — I-STAT BETA HCG BLOOD, ED (MC, WL, AP ONLY): I-stat hCG, quantitative: <5 m[IU]/mL (ref <5)

## 2019-12-30 LAB — COMPREHENSIVE METABOLIC PANEL
ALT: 13 U/L (ref 0–44)
AST: 15 U/L (ref 15–41)
Albumin: 4.2 g/dL (ref 3.5–5.0)
Alkaline Phosphatase: 77 U/L (ref 38–126)
Anion gap: 10 (ref 5–15)
BUN: 8 mg/dL (ref 6–20)
CO2: 19 mmol/L — ABNORMAL LOW (ref 22–32)
Calcium: 9.6 mg/dL (ref 8.9–10.3)
Chloride: 109 mmol/L (ref 98–111)
Creatinine, Ser: 0.73 mg/dL (ref 0.44–1.00)
GFR, Estimated: >60 mL/min (ref >60)
Glucose, Bld: 139 mg/dL — ABNORMAL HIGH (ref 70–99)
Potassium: 3.8 mmol/L (ref 3.5–5.1)
Sodium: 138 mmol/L (ref 135–145)
Total Bilirubin: 0.3 mg/dL (ref 0.3–1.2)
Total Protein: 7.8 g/dL (ref 6.5–8.1)

## 2019-12-30 LAB — CBC
HCT: 38.5 % (ref 36.0–46.0)
Hemoglobin: 12.2 g/dL (ref 12.0–15.0)
MCH: 23.1 pg — ABNORMAL LOW (ref 26.0–34.0)
MCHC: 31.7 g/dL (ref 30.0–36.0)
MCV: 72.8 fL — ABNORMAL LOW (ref 80.0–100.0)
Platelets: 522 10*3/uL — ABNORMAL HIGH (ref 150–400)
RBC: 5.29 MIL/uL — ABNORMAL HIGH (ref 3.87–5.11)
RDW: 17.4 % — ABNORMAL HIGH (ref 11.5–15.5)
WBC: 7.9 10*3/uL (ref 4.0–10.5)
nRBC: 0 % (ref 0.0–0.2)

## 2019-12-30 LAB — URINALYSIS, ROUTINE W REFLEX MICROSCOPIC
Bilirubin Urine: NEGATIVE
Glucose, UA: NEGATIVE mg/dL
Hgb urine dipstick: NEGATIVE
Ketones, ur: NEGATIVE mg/dL
Leukocytes,Ua: NEGATIVE
Nitrite: NEGATIVE
Protein, ur: NEGATIVE mg/dL
Specific Gravity, Urine: 1.015 (ref 1.005–1.030)
pH: 5 (ref 5.0–8.0)

## 2019-12-30 LAB — LIPASE, BLOOD: Lipase: 35 U/L (ref 11–51)

## 2019-12-30 MED ORDER — PROMETHAZINE HCL 25 MG/ML IJ SOLN
25.0000 mg | Freq: Once | INTRAMUSCULAR | Status: AC
  Administered 2019-12-30: 25 mg via INTRAVENOUS
  Filled 2019-12-30: qty 1

## 2019-12-30 MED ORDER — SODIUM CHLORIDE 0.9 % IV BOLUS
1000.0000 mL | Freq: Once | INTRAVENOUS | Status: AC
  Administered 2019-12-30: 1000 mL via INTRAVENOUS

## 2019-12-30 MED ORDER — KETOROLAC TROMETHAMINE 30 MG/ML IJ SOLN
30.0000 mg | Freq: Once | INTRAMUSCULAR | Status: AC
  Administered 2019-12-30: 30 mg via INTRAVENOUS
  Filled 2019-12-30: qty 1

## 2019-12-30 NOTE — Discharge Instructions (Addendum)
Follow-up with your gastroenterologist.  

## 2019-12-30 NOTE — ED Triage Notes (Signed)
Pt POV c/o abdominal pain since yesterday, diarrhea for same time.  Pt also c/o headache since late last night, at which point chest pain started too.  Pt reports n/v.

## 2019-12-30 NOTE — ED Provider Notes (Signed)
[place] DEPT Provider Note   CSN: [id number] Arrival date & time: 12/30/19  1234     History Chief Complaint  Patient presents with  . Abdominal Pain  . Tachycardia  . Headache  . Chest Pain    Darbi Chandran is a 24 y.o. female.  HPI Patient presents abdominal pain headache and diarrhea.  Began yesterday.  States she just needs something to help her sleep.  History of ulcerative colitis and may be Crohn's.  Also potential celiac disease.  Previous endometriosis also ovarian cyst.  Previous lupus.  Reportedly has had a meningitis previously.  States she just wants the pain to feel better.  Sees [facility] for her GI care.  Patient states she gets episodes like this that last for a while and then go away. States that she does not take the Ambien that she has been prescribed for sleep.  States it makes her scream in her sleep.  However she appears to have filled the monthly prescription every month for least the last 6 months.    Past Medical History:  Diagnosis Date  . Asthma   . Celiac disease   . Endometriosis   . Lupus ([place])   . Ovarian cyst   . Ulcerative colitis ([place])     There are no problems to display for this patient.   Past Surgical History:  Procedure Laterality Date  . APPENDECTOMY    . CHOLECYSTECTOMY       OB History   No obstetric history on file.     Family History  Problem Relation Age of Onset  . Hypertension Mother   . Hypercholesterolemia Mother   . Diabetes Father   . Hypertension Father   . Cancer Father     Social History   Tobacco Use  . Smoking status: Never Smoker  . Smokeless tobacco: Never Used  Vaping Use  . Vaping Use: Never used  Substance Use Topics  . Alcohol use: Never  . Drug use: Never    Home Medications Prior to Admission medications   Medication Sig Start Date End Date Taking? Authorizing Provider  albuterol (VENTOLIN HFA) 108 (90 Base) MCG/ACT inhaler Inhale 2 puffs into  the lungs every 6 (six) hours as needed for wheezing or shortness of breath.   Yes [provider]  ALPRAZolam Duanne Moron) 0.5 MG tablet Take 0.25-0.5 mg by mouth daily as needed for anxiety.   Yes [provider]  diphenhydramine-acetaminophen (TYLENOL PM) 25-500 MG TABS tablet Take 1 tablet by mouth at bedtime as needed (sleep).    Yes [provider]  hydroxychloroquine (PLAQUENIL) 200 MG tablet Take 400 mg by mouth daily.  03/14/18  Yes [provider]  Norethindrone-Ethinyl Estradiol-Fe Biphas (LO LOESTRIN FE) 1 MG-10 MCG / 10 MCG tablet Take 1 tablet by mouth daily.   Yes [provider]  sertraline (ZOLOFT) 100 MG tablet Take 200 mg by mouth at bedtime.   Yes [provider]  zolpidem (AMBIEN) 10 MG tablet Take 20 mg by mouth at bedtime as needed for sleep.    Yes [provider]  promethazine (PHENERGAN) 25 MG suppository Place 1 suppository (25 mg total) rectally every 6 (six) hours as needed for nausea or vomiting. Patient not taking: Reported on 12/30/2019 11/13/19   Dorie Rank, MD  dicyclomine (BENTYL) 20 MG tablet Take 1 tablet (20 mg total) by mouth 2 (two) times daily. 11/29/18 04/11/19  Muthersbaugh, Jarrett Soho, PA-C  Fluticasone Propionate HFA (FLOVENT HFA IN)  Inhale into the lungs. Patient not taking: Reported on 11/14/2019  11/14/19  [provider]    Allergies    Azithromycin, Peanut-containing drug products, Contrast media [iodinated diagnostic agents], Other, Amoxicillin, Fish allergy, Gluten meal, Ibuprofen, and Pantoprazole  Review of Systems   Review of Systems  Constitutional: Negative for appetite change.  HENT: Negative for congestion.   Respiratory: Negative for shortness of breath.   Gastrointestinal: Positive for abdominal pain, diarrhea and nausea.  Genitourinary: Negative for flank pain.  Musculoskeletal: Negative for back pain.  Skin: Negative for rash.  Neurological: Positive for headaches.   Psychiatric/Behavioral: Negative for confusion.    Physical Exam Updated Vital Signs BP 125/80   Pulse 96   Temp 97.9 F (36.6 C) (Oral)   Resp (!) 23   LMP  (LMP Unknown) Comment: negative HCG blood test 11-13-2019  SpO2 97%   Physical Exam Vitals and nursing note reviewed.  Constitutional:      Appearance: She is well-developed.  HENT:     Head: Atraumatic.  Cardiovascular:     Rate and Rhythm: Tachycardia present.  Pulmonary:     Breath sounds: Normal breath sounds.  Abdominal:     Palpations: There is no mass.     Tenderness: There is abdominal tenderness.     Comments: Tenderness in lower abdomen without rebound or guarding.  Skin:    General: Skin is warm.     Capillary Refill: Capillary refill takes less than 2 seconds.  Neurological:     Mental Status: She is oriented to person, place, and time.  Psychiatric:        Mood and Affect: Mood normal.     ED Results / Procedures / Treatments   Labs (all labs ordered are listed, but only abnormal results are displayed) Labs Reviewed  COMPREHENSIVE METABOLIC PANEL - Abnormal; Notable for the following components:      Result Value   CO2 19 (*)    Glucose, Bld 139 (*)    All other components within normal limits  CBC - Abnormal; Notable for the following components:   RBC 5.29 (*)    MCV 72.8 (*)    MCH 23.1 (*)    RDW 17.4 (*)    Platelets 522 (*)    All other components within normal limits  LIPASE, BLOOD  URINALYSIS, ROUTINE W REFLEX MICROSCOPIC  I-STAT BETA HCG BLOOD, ED ([facility], [facility], [facility] ONLY)    EKG None  Radiology No results found.  Procedures Procedures (including critical care time)  Medications Ordered in ED Medications  sodium chloride 0.9 % bolus 1,000 mL (0 mLs Intravenous Stopped 12/30/19 1842)  ketorolac (TORADOL) 30 MG/ML injection 30 mg (30 mg Intravenous Given 12/30/19 1639)  promethazine (PHENERGAN) injection 25 mg (25 mg Intravenous Given 12/30/19 1704)    ED Course  I have  reviewed the triage vital signs and the nursing notes.  Pertinent labs & imaging results that were available during my care of the patient were reviewed by me and considered in my medical decision making (see chart for details).    MDM Rules/Calculators/A&P                          Patient abdominal pain and headache.  History of reported Crohn's disease and potentially ulcerative colitis.  Also questionable celiac disease.  Initially tachycardic.  Reportedly had some vomiting.  Lab work overall reassuring.  Mild dehydration.  Reviewed recent CTs and last for CTs  have all been with her abdominal pain and showed no abnormalities.  Feels somewhat better after treatment.  Think patient will benefit from GI follow-up but does not need further imaging at this time.  Phenergan given and patient feels better.  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  Patient states she had some stool draining out of her vagina.  However she defers pelvic exam at this time since it will likely not change the management.  Has Phenergan at home.  Discharge home. Final Clinical Impression(s) / ED Diagnoses Final diagnoses:  Abdominal pain, unspecified abdominal location  Nonintractable headache, unspecified chronicity pattern, unspecified headache type    Rx / DC Orders ED Discharge Orders    None       Davonna Belling, MD 12/31/19 [phone]

## 2019-12-31 ENCOUNTER — Encounter (HOSPITAL_COMMUNITY): Payer: Self-pay | Admitting: *Deleted

## 2019-12-31 ENCOUNTER — Other Ambulatory Visit: Payer: Self-pay

## 2019-12-31 ENCOUNTER — Emergency Department (HOSPITAL_COMMUNITY)
Admission: EM | Admit: 2019-12-31 | Discharge: 2019-12-31 | Disposition: A | Discharge status: Home / Self Care | Payer: PPO | Attending: Emergency Medicine | Admitting: Emergency Medicine

## 2019-12-31 DIAGNOSIS — R109 Unspecified abdominal pain: Secondary | ICD-10-CM | POA: Diagnosis present

## 2019-12-31 DIAGNOSIS — Z91010 Allergy to peanuts: Secondary | ICD-10-CM | POA: Diagnosis not present

## 2019-12-31 DIAGNOSIS — R112 Nausea with vomiting, unspecified: Secondary | ICD-10-CM | POA: Insufficient documentation

## 2019-12-31 DIAGNOSIS — R197 Diarrhea, unspecified: Secondary | ICD-10-CM | POA: Diagnosis not present

## 2019-12-31 DIAGNOSIS — R1084 Generalized abdominal pain: Secondary | ICD-10-CM | POA: Insufficient documentation

## 2019-12-31 DIAGNOSIS — J45909 Unspecified asthma, uncomplicated: Secondary | ICD-10-CM | POA: Insufficient documentation

## 2019-12-31 LAB — URINALYSIS, COMPLETE (UACMP) WITH MICROSCOPIC
Bilirubin Urine: NEGATIVE
Glucose, UA: NEGATIVE mg/dL
Hgb urine dipstick: NEGATIVE
Ketones, ur: NEGATIVE mg/dL
Leukocytes,Ua: NEGATIVE
Nitrite: NEGATIVE
Protein, ur: NEGATIVE mg/dL
Specific Gravity, Urine: 1.024 (ref 1.005–1.030)
pH: 5 (ref 5.0–8.0)

## 2019-12-31 LAB — CBC
HCT: 36 % (ref 36.0–46.0)
Hemoglobin: 11.3 g/dL — ABNORMAL LOW (ref 12.0–15.0)
MCH: 23 pg — ABNORMAL LOW (ref 26.0–34.0)
MCHC: 31.4 g/dL (ref 30.0–36.0)
MCV: 73.3 fL — ABNORMAL LOW (ref 80.0–100.0)
Platelets: 511 10*3/uL — ABNORMAL HIGH (ref 150–400)
RBC: 4.91 MIL/uL (ref 3.87–5.11)
RDW: 17.2 % — ABNORMAL HIGH (ref 11.5–15.5)
WBC: 12 10*3/uL — ABNORMAL HIGH (ref 4.0–10.5)
nRBC: 0 % (ref 0.0–0.2)

## 2019-12-31 LAB — I-STAT BETA HCG BLOOD, ED (MC, WL, AP ONLY): I-stat hCG, quantitative: <5 m[IU]/mL (ref <5)

## 2019-12-31 LAB — COMPREHENSIVE METABOLIC PANEL
ALT: 12 U/L (ref 0–44)
AST: 18 U/L (ref 15–41)
Albumin: 3.6 g/dL (ref 3.5–5.0)
Alkaline Phosphatase: 68 U/L (ref 38–126)
Anion gap: 13 (ref 5–15)
BUN: 10 mg/dL (ref 6–20)
CO2: 18 mmol/L — ABNORMAL LOW (ref 22–32)
Calcium: 9.4 mg/dL (ref 8.9–10.3)
Chloride: 109 mmol/L (ref 98–111)
Creatinine, Ser: 0.93 mg/dL (ref 0.44–1.00)
GFR, Estimated: >60 mL/min (ref >60)
Glucose, Bld: 165 mg/dL — ABNORMAL HIGH (ref 70–99)
Potassium: 3.5 mmol/L (ref 3.5–5.1)
Sodium: 140 mmol/L (ref 135–145)
Total Bilirubin: 0.4 mg/dL (ref 0.3–1.2)
Total Protein: 6.8 g/dL (ref 6.5–8.1)

## 2019-12-31 LAB — LIPASE, BLOOD: Lipase: 27 U/L (ref 11–51)

## 2019-12-31 MED ORDER — KETOROLAC TROMETHAMINE 30 MG/ML IJ SOLN
30.0000 mg | Freq: Once | INTRAMUSCULAR | Status: AC
  Administered 2019-12-31: 30 mg via INTRAVENOUS
  Filled 2019-12-31: qty 1

## 2019-12-31 MED ORDER — PROCHLORPERAZINE EDISYLATE 10 MG/2ML IJ SOLN
10.0000 mg | Freq: Once | INTRAMUSCULAR | Status: AC
  Administered 2019-12-31: 10 mg via INTRAVENOUS
  Filled 2019-12-31: qty 2

## 2019-12-31 MED ORDER — DIPHENHYDRAMINE HCL 50 MG/ML IJ SOLN
25.0000 mg | Freq: Once | INTRAMUSCULAR | Status: AC
  Administered 2019-12-31: 25 mg via INTRAVENOUS
  Filled 2019-12-31: qty 1

## 2019-12-31 MED ORDER — SODIUM CHLORIDE 0.9 % IV BOLUS
1000.0000 mL | Freq: Once | INTRAVENOUS | Status: AC
  Administered 2019-12-31: 1000 mL via INTRAVENOUS

## 2019-12-31 NOTE — ED Provider Notes (Signed)
[facility] EMERGENCY DEPARTMENT Provider Note   CSN: [id number] Arrival date & time: 12/31/19  9937     History Chief Complaint  Patient presents with  . Abdominal Pain    [name] is a 24 y.o. female.  Patient presents to the emergency department with a chief complaint of abdominal pain.  She reports associated nausea, vomiting, diarrhea.  She has history of ulcerative colitis, questionable Crohn's disease, celiac disease, lupus, endometriosis.  She states that her pain has been present for the past 2 days.  She was seen yesterday for the same.  She has follow-up with GI next week.  She denies fevers or chills.  She states that she has been taking Phenergan at home with little relief.  She denies any other associated symptoms.  The history is provided by the patient. No language interpreter was used.       Past Medical History:  Diagnosis Date  . Asthma   . Celiac disease   . Endometriosis   . Lupus ([place])   . Ovarian cyst   . Ulcerative colitis ([place])     There are no problems to display for this patient.   Past Surgical History:  Procedure Laterality Date  . APPENDECTOMY    . CHOLECYSTECTOMY       OB History   No obstetric history on file.     Family History  Problem Relation Age of Onset  . Hypertension Mother   . Hypercholesterolemia Mother   . Diabetes Father   . Hypertension Father   . Cancer Father     Social History   Tobacco Use  . Smoking status: Never Smoker  . Smokeless tobacco: Never Used  Vaping Use  . Vaping Use: Never used  Substance Use Topics  . Alcohol use: Never  . Drug use: Never    Home Medications Prior to Admission medications   Medication Sig Start Date End Date Taking? Authorizing Provider  albuterol (VENTOLIN HFA) 108 (90 Base) MCG/ACT inhaler Inhale 2 puffs into the lungs every 6 (six) hours as needed for wheezing or shortness of breath.   Yes [provider]  ALPRAZolam Duanne Moron) 0.5 MG  tablet Take 0.25-0.5 mg by mouth daily as needed for anxiety.   Yes [provider]  diphenhydramine-acetaminophen (TYLENOL PM) 25-500 MG TABS tablet Take 1 tablet by mouth at bedtime as needed (sleep).    Yes [provider]  hydroxychloroquine (PLAQUENIL) 200 MG tablet Take 400 mg by mouth daily.  03/14/18  Yes [provider]  Norethindrone-Ethinyl Estradiol-Fe Biphas (LO LOESTRIN FE) 1 MG-10 MCG / 10 MCG tablet Take 1 tablet by mouth daily.   Yes [provider]  promethazine (PHENERGAN) 25 MG tablet Take 25 mg by mouth every 6 (six) hours as needed for nausea or vomiting.   Yes [provider]  sertraline (ZOLOFT) 100 MG tablet Take 200 mg by mouth at bedtime.   Yes [provider]  zolpidem (AMBIEN) 10 MG tablet Take 20 mg by mouth at bedtime as needed for sleep.    Yes [provider]  promethazine (PHENERGAN) 25 MG suppository Place 1 suppository (25 mg total) rectally every 6 (six) hours as needed for nausea or vomiting. Patient not taking: Reported on 12/30/2019 11/13/19   Dorie Rank, MD  dicyclomine (BENTYL) 20 MG tablet Take 1 tablet (20 mg total) by mouth 2 (two) times daily. 11/29/18 04/11/19  Muthersbaugh, Jarrett Soho, PA-C  Fluticasone Propionate HFA (FLOVENT HFA IN) Inhale into the  lungs. Patient not taking: Reported on 11/14/2019  11/14/19  [provider]    Allergies    Azithromycin, Peanut-containing drug products, Contrast media [iodinated diagnostic agents], Other, Amoxicillin, Fish allergy, Gluten meal, Ibuprofen, and Pantoprazole  Review of Systems   Review of Systems  All other systems reviewed and are negative.   Physical Exam Updated Vital Signs BP (!) 131/92   Pulse 96   Temp 97.9 F (36.6 C)   Resp 17   Ht 5' 2"  (1.575 m)   Wt 108.9 kg   LMP 12/31/2019 Comment: negative HCG blood test 11-13-2019  SpO2 98%   BMI 43.91 kg/m   Physical Exam Vitals and nursing note reviewed.  Constitutional:       General: She is not in acute distress.    Appearance: She is well-developed and well-nourished.  HENT:     Head: Normocephalic and atraumatic.  Eyes:     Conjunctiva/sclera: Conjunctivae normal.  Cardiovascular:     Rate and Rhythm: Normal rate and regular rhythm.     Heart sounds: No murmur heard.   Pulmonary:     Effort: Pulmonary effort is normal. No respiratory distress.     Breath sounds: Normal breath sounds.  Abdominal:     Palpations: Abdomen is soft.     Tenderness: There is no abdominal tenderness.     Comments: Generalized abdominal discomfort, but no focal tenderness  Musculoskeletal:        General: No edema.     Cervical back: Neck supple.  Skin:    General: Skin is warm and dry.  Neurological:     Mental Status: She is alert and oriented to person, place, and time.  Psychiatric:        Mood and Affect: Mood and affect and mood normal.        Behavior: Behavior normal.     ED Results / Procedures / Treatments   Labs (all labs ordered are listed, but only abnormal results are displayed) Labs Reviewed  CBC - Abnormal; Notable for the following components:      Result Value   WBC 12.0 (*)    Hemoglobin 11.3 (*)    MCV 73.3 (*)    MCH 23.0 (*)    RDW 17.2 (*)    Platelets 511 (*)    All other components within normal limits  COMPREHENSIVE METABOLIC PANEL - Abnormal; Notable for the following components:   CO2 18 (*)    Glucose, Bld 165 (*)    All other components within normal limits  URINALYSIS, COMPLETE (UACMP) WITH MICROSCOPIC - Abnormal; Notable for the following components:   APPearance HAZY (*)    Bacteria, UA RARE (*)    All other components within normal limits  LIPASE, BLOOD  I-STAT BETA HCG BLOOD, ED ([facility], [facility], [facility] ONLY)    EKG None  Radiology No results found.  Procedures Procedures (including critical care time)  Medications Ordered in ED Medications  sodium chloride 0.9 % bolus 1,000 mL (has no administration in time range)   ketorolac (TORADOL) 30 MG/ML injection 30 mg (has no administration in time range)  diphenhydrAMINE (BENADRYL) injection 25 mg (has no administration in time range)  prochlorperazine (COMPAZINE) injection 10 mg (has no administration in time range)    ED Course  I have reviewed the triage vital signs and the nursing notes.  Pertinent labs & imaging results that were available during my care of the patient were reviewed by me and considered in my medical  decision making (see chart for details).    MDM Rules/Calculators/A&P                          Patient with abdominal pain, nausea, vomiting, diarrhea.  She has history of ulcerative colitis, and questionable Crohn's.  She has follow-up with GI next week.  She was seen yesterday for the same.  Improved with fluids and Toradol and Phenergan.  I will give her a repeat dose of Toradol, fluids, and Compazine.  She is nontoxic in appearance.  I doubt surgical or acute abdomen.  Recent CT scans are reviewed.  I did offer CT, but the patient declined.  She states that she cannot tolerate pelvic exams, and states that she has to be sedated for these.  I doubt torsion.  Doubt TOA.  She is not uncomfortable in appearance.  She has nontoxic presentation. She has hx of appendectomy and cholecystectomy.   I suspect her symptoms are GI related.  She might benefit from a short course of steroid at home.  We will treat her symptoms now, and plan for discharge to home.    Final Clinical Impression(s) / ED Diagnoses Final diagnoses:  Generalized abdominal pain    Rx / DC Orders ED Discharge Orders    None       Montine Circle, PA-C 12/31/19 [place], Delice Bison, DO 12/31/19 [phone]

## 2019-12-31 NOTE — Discharge Instructions (Addendum)
Please see  your GI doctor in follow-up ASAP.  Use the phenergan, bentyl, and heating pads. Return for fever.

## 2020-01-26 DIAGNOSIS — N3289 Other specified disorders of bladder: Secondary | ICD-10-CM | POA: Insufficient documentation

## 2020-01-29 ENCOUNTER — Ambulatory Visit: Payer: PPO | Admitting: Internal Medicine

## 2020-02-03 DIAGNOSIS — R300 Dysuria: Secondary | ICD-10-CM | POA: Insufficient documentation

## 2020-02-03 DIAGNOSIS — R319 Hematuria, unspecified: Secondary | ICD-10-CM | POA: Insufficient documentation

## 2020-02-06 DIAGNOSIS — I2699 Other pulmonary embolism without acute cor pulmonale: Secondary | ICD-10-CM

## 2020-02-06 HISTORY — DX: Other pulmonary embolism without acute cor pulmonale: I26.99

## 2020-02-26 ENCOUNTER — Encounter (HOSPITAL_COMMUNITY): Payer: Self-pay | Admitting: Emergency Medicine

## 2020-02-26 ENCOUNTER — Ambulatory Visit (HOSPITAL_COMMUNITY)
Admission: EM | Admit: 2020-02-26 | Discharge: 2020-02-26 | Disposition: A | Discharge status: Home / Self Care | Payer: PPO | Attending: Internal Medicine | Admitting: Internal Medicine

## 2020-02-26 ENCOUNTER — Other Ambulatory Visit: Payer: Self-pay

## 2020-02-26 DIAGNOSIS — Z1152 Encounter for screening for COVID-19: Secondary | ICD-10-CM | POA: Insufficient documentation

## 2020-02-26 DIAGNOSIS — J069 Acute upper respiratory infection, unspecified: Secondary | ICD-10-CM | POA: Insufficient documentation

## 2020-02-26 MED ORDER — CETIRIZINE HCL 10 MG PO TABS: 10.0000 mg | ORAL_TABLET | Freq: Every day | ORAL | 0 refills | Status: DC

## 2020-02-26 MED ORDER — PROMETHAZINE-DM 6.25-15 MG/5ML PO SYRP: 5.0000 mL | ORAL_SOLUTION | Freq: Every evening | ORAL | 0 refills | Status: DC | PRN

## 2020-02-26 MED ORDER — PSEUDOEPHEDRINE HCL 60 MG PO TABS: 60.0000 mg | ORAL_TABLET | Freq: Three times a day (TID) | ORAL | 0 refills | Status: DC | PRN

## 2020-02-26 NOTE — Discharge Instructions (Addendum)

## 2020-02-26 NOTE — ED Provider Notes (Signed)
[facility]   MRN: [id number] DOB: 04-13-1995  Subjective:   [name] is a 25 y.o. female presenting for 2-day history of malaise, fatigue, body aches, cough.  Patient is not COVID vaccinated.  Denies history of smoking.  She does have a remote history of asthma she is also had episodes of pulmonary emboli.  She is on Eliquis.  Denies feeling chest pain, shortness of breath.  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  No current facility-administered medications for this encounter.  Current Outpatient Medications:  .  albuterol (VENTOLIN HFA) 108 (90 Base) MCG/ACT inhaler, Inhale 2 puffs into the lungs every 6 (six) hours as needed for wheezing or shortness of breath., Disp: , Rfl:  .  ALPRAZolam (XANAX) 0.5 MG tablet, Take 0.25-0.5 mg by mouth daily as needed for anxiety., Disp: , Rfl:  .  diphenhydramine-acetaminophen (TYLENOL PM) 25-500 MG TABS tablet, Take 1 tablet by mouth at bedtime as needed (sleep). , Disp: , Rfl:  .  hydroxychloroquine (PLAQUENIL) 200 MG tablet, Take 400 mg by mouth daily. , Disp: , Rfl:  .  Norethindrone-Ethinyl Estradiol-Fe Biphas (LO LOESTRIN FE) 1 MG-10 MCG / 10 MCG tablet, Take 1 tablet by mouth daily., Disp: , Rfl:  .  promethazine (PHENERGAN) 25 MG suppository, Place 1 suppository (25 mg total) rectally every 6 (six) hours as needed for nausea or vomiting. (Patient not taking: Reported on 12/30/2019), Disp: 12 each, Rfl: 0 .  promethazine (PHENERGAN) 25 MG tablet, Take 25 mg by mouth every 6 (six) hours as needed for nausea or vomiting., Disp: , Rfl:  .  sertraline (ZOLOFT) 100 MG tablet, Take 200 mg by mouth at bedtime., Disp: , Rfl:  .  zolpidem (AMBIEN) 10 MG tablet, Take 20 mg by mouth at bedtime as needed for sleep. , Disp: , Rfl:    Allergies  Allergen Reactions  . Azithromycin Anaphylaxis  . Peanut-Containing Drug Products Other (See Comments)    Unknown ; treenuts, shell fish  . Contrast Media  [Iodinated Diagnostic Agents] Hives  . Other Other (See Comments)    Moderna covid vaccine -Syncope   . Amoxicillin Swelling    Hand swelling   . Fish Allergy Other (See Comments)    unknown  . Gluten Meal Other (See Comments)    Celiac disease  . Ibuprofen Nausea And Vomiting  . Pantoprazole Other (See Comments)    Stomach pain    Past Medical History:  Diagnosis Date  . Asthma   . Celiac disease   . Endometriosis   . Lupus ([place])   . Ovarian cyst   . Ulcerative colitis [facility])      Past Surgical History:  Procedure Laterality Date  . APPENDECTOMY    . CHOLECYSTECTOMY      Family History  Problem Relation Age of Onset  . Hypertension Mother   . Hypercholesterolemia Mother   . Diabetes Father   . Hypertension Father   . Cancer Father     Social History   Tobacco Use  . Smoking status: Never Smoker  . Smokeless tobacco: Never Used  Vaping Use  . Vaping Use: Never used  Substance Use Topics  . Alcohol use: Never  . Drug use: Never    ROS   Objective:   Vitals: BP 131/69 (BP Location: Right Arm)   Pulse (!) 102   Temp 98.6 F (37 C) (Oral)   Resp 20   LMP  (LMP Unknown) Comment: reports  not having periods  SpO2 100%   Physical Exam Constitutional:      General: She is not in acute distress.    Appearance: Normal appearance. She is well-developed. She is not ill-appearing, toxic-appearing or diaphoretic.  HENT:     Head: Normocephalic and atraumatic.     Nose: Nose normal.     Mouth/Throat:     Mouth: Mucous membranes are moist.  Eyes:     Extraocular Movements: Extraocular movements intact.     Pupils: Pupils are equal, round, and reactive to light.  Cardiovascular:     Rate and Rhythm: Normal rate and regular rhythm.     Pulses: Normal pulses.     Heart sounds: Normal heart sounds. No murmur heard. No friction rub. No gallop.   Pulmonary:     Effort: Pulmonary effort is normal. No respiratory distress.     Breath sounds: Normal breath  sounds. No stridor. No wheezing, rhonchi or rales.  Skin:    General: Skin is warm and dry.     Findings: No rash.  Neurological:     Mental Status: She is alert and oriented to person, place, and time.  Psychiatric:        Mood and Affect: Mood normal.        Behavior: Behavior normal.        Thought Content: Thought content normal.      Assessment and Plan :   PDMP not reviewed this encounter.  1. Viral URI with cough   2. Encounter for screening for COVID-19     Will manage for viral illness such as viral URI, viral syndrome, viral rhinitis, COVID-19. Counseled patient on nature of COVID-19 including modes of transmission, diagnostic testing, management and supportive care.  Offered scripts for symptomatic relief. COVID 19 testing is pending. Counseled patient on potential for adverse effects with medications prescribed/recommended today, ER and return-to-clinic precautions discussed, patient verbalized understanding.     Jaynee Eagles, PA-C 02/27/20 1009

## 2020-02-26 NOTE — ED Triage Notes (Signed)
Pt presents today with c/o of cough and body aches x 2 days.

## 2020-02-27 LAB — SARS CORONAVIRUS 2 (TAT 6-24 HRS): SARS Coronavirus 2: NEGATIVE

## 2020-03-01 ENCOUNTER — Encounter (HOSPITAL_COMMUNITY): Payer: Self-pay

## 2020-03-01 ENCOUNTER — Other Ambulatory Visit: Payer: Self-pay

## 2020-03-01 ENCOUNTER — Ambulatory Visit (INDEPENDENT_AMBULATORY_CARE_PROVIDER_SITE_OTHER): Payer: PPO

## 2020-03-01 ENCOUNTER — Ambulatory Visit (HOSPITAL_COMMUNITY)
Admission: EM | Admit: 2020-03-01 | Discharge: 2020-03-01 | Disposition: A | Discharge status: Home / Self Care | Payer: PPO | Attending: Emergency Medicine | Admitting: Emergency Medicine

## 2020-03-01 DIAGNOSIS — Z886 Allergy status to analgesic agent status: Secondary | ICD-10-CM | POA: Insufficient documentation

## 2020-03-01 DIAGNOSIS — J069 Acute upper respiratory infection, unspecified: Secondary | ICD-10-CM

## 2020-03-01 DIAGNOSIS — Z79899 Other long term (current) drug therapy: Secondary | ICD-10-CM | POA: Insufficient documentation

## 2020-03-01 DIAGNOSIS — R509 Fever, unspecified: Secondary | ICD-10-CM

## 2020-03-01 DIAGNOSIS — R059 Cough, unspecified: Secondary | ICD-10-CM

## 2020-03-01 DIAGNOSIS — R058 Other specified cough: Secondary | ICD-10-CM

## 2020-03-01 DIAGNOSIS — Z7901 Long term (current) use of anticoagulants: Secondary | ICD-10-CM | POA: Diagnosis not present

## 2020-03-01 DIAGNOSIS — Z880 Allergy status to penicillin: Secondary | ICD-10-CM | POA: Diagnosis not present

## 2020-03-01 DIAGNOSIS — Z881 Allergy status to other antibiotic agents status: Secondary | ICD-10-CM | POA: Insufficient documentation

## 2020-03-01 DIAGNOSIS — Z86711 Personal history of pulmonary embolism: Secondary | ICD-10-CM | POA: Diagnosis not present

## 2020-03-01 DIAGNOSIS — Z20822 Contact with and (suspected) exposure to covid-19: Secondary | ICD-10-CM | POA: Diagnosis not present

## 2020-03-01 LAB — SARS CORONAVIRUS 2 (TAT 6-24 HRS): SARS Coronavirus 2: NEGATIVE

## 2020-03-01 LAB — POC INFLUENZA A AND B ANTIGEN (URGENT CARE ONLY)
Influenza A Ag: NEGATIVE
Influenza B Ag: NEGATIVE

## 2020-03-01 NOTE — ED Triage Notes (Signed)
Pt c/o congestion, non-productive cough, fever for five days. States she was evaluated here on 02/04/ and had negative covid tests and RX given are helping with pt's symptoms. Only tried zyrtec for two days, and cough syrup once.  Reports Tmax of 104 last night (oral). States she is vomiting 2/2 forceful coughing.  Denies increase SOB, sore throat, ear pain  Took tylenol 1000mg  at 0700.  Pt reports she is on Eliquis s/p x4 PE approx 4 weeks ago after surgery.

## 2020-03-01 NOTE — Discharge Instructions (Addendum)
Go to the emergency department if you have acute shortness of breath or other concerning symptoms.  Take Tylenol as directed on the label as needed for fever.  Your chest xray is normal.   Your flu test is negative.    Your COVID test is pending.  You should self quarantine until the test result is back.    Follow up with your primary care provider if your symptoms are not improving.

## 2020-03-01 NOTE — ED Provider Notes (Signed)
[facility]    CSN: [id number] Arrival date & time: 03/01/20  7829      History   Chief Complaint Chief Complaint  Patient presents with  . Cough  . Fever    HPI [name] is a 25 y.o. female.   Patient presents with fever, congestion, and cough x5 days.  She states her temperature was 104 last night; she has been taking Tylenol for this.  She states she is coughing so much that she vomits.  She denies shortness of breath, wheezing, diarrhea, or other symptoms.  Patient reports she had pulmonary embolisms 4 weeks ago while in [place] and is on Eliquis.  Patient was seen here on 02/27/2020; diagnosed with viral URI with cough; treated symptomatically; COVID negative.  Her medical history includes asthma, pulmonary embolism, celiac disease, ulcerative colitis, lupus, endometriosis.  The history is provided by the patient and medical records.    Past Medical History:  Diagnosis Date  . Asthma   . Celiac disease   . Endometriosis   . Lupus ([place])   . Ovarian cyst   . Pulmonary embolus ([place]) 02/06/2020  . Ulcerative colitis ([place])     There are no problems to display for this patient.   Past Surgical History:  Procedure Laterality Date  . APPENDECTOMY    . CHOLECYSTECTOMY      OB History   No obstetric history on file.      Home Medications    Prior to Admission medications   Medication Sig Start Date End Date Taking? Authorizing Provider  albuterol (VENTOLIN HFA) 108 (90 Base) MCG/ACT inhaler Inhale 2 puffs into the lungs every 6 (six) hours as needed for wheezing or shortness of breath.   Yes [provider]  ALPRAZolam Duanne Moron) 0.5 MG tablet Take 0.25-0.5 mg by mouth daily as needed for anxiety.   Yes [provider]  cetirizine (ZYRTEC ALLERGY) 10 MG tablet Take 1 tablet (10 mg total) by mouth daily. 02/26/20  Yes Jaynee Eagles, PA-C  hydroxychloroquine (PLAQUENIL) 200 MG tablet Take 400 mg by mouth daily.  03/14/18  Yes [provider]  Norethindrone-Ethinyl Estradiol-Fe Biphas (LO LOESTRIN FE) 1 MG-10 MCG / 10 MCG tablet Take 1 tablet by mouth daily.   Yes [provider]  promethazine-dextromethorphan (PROMETHAZINE-DM) 6.25-15 MG/5ML syrup Take 5 mLs by mouth at bedtime as needed for cough. 02/26/20  Yes Jaynee Eagles, PA-C  pseudoephedrine (SUDAFED) 60 MG tablet Take 1 tablet (60 mg total) by mouth every 8 (eight) hours as needed for congestion. 02/26/20  Yes Jaynee Eagles, PA-C  zolpidem (AMBIEN) 10 MG tablet Take 20 mg by mouth at bedtime as needed for sleep.    Yes [provider]  apixaban (ELIQUIS) 5 MG TABS tablet Eliquis DVT-PE Treatment 30-Day Starter 5 mg (74 tablets) in dose pack  TAKE 2 TABLETS BY MOUTH TWICE DAILY FOR 7 DAYS THEN TAKE 1 TABLET BY MOUTH TWICE DAILY THEREAFTER    [provider]  diphenhydramine-acetaminophen (TYLENOL PM) 25-500 MG TABS tablet Take 1 tablet by mouth at bedtime as needed (sleep).     [provider]  sertraline (ZOLOFT) 100 MG tablet Take 200 mg by mouth at bedtime.    [provider]  dicyclomine (BENTYL) 20 MG tablet Take 1 tablet (20 mg total) by mouth 2 (two) times daily. 11/29/18 04/11/19  Muthersbaugh, Jarrett Soho, PA-C  Fluticasone Propionate HFA (FLOVENT HFA IN) Inhale into the lungs. Patient not taking: Reported on 11/14/2019  11/14/19  [provider]  promethazine (PHENERGAN) 25 MG tablet Take 25 mg by mouth every 6 (six) hours as needed for nausea or vomiting.  02/26/20  [provider]    Family History Family History  Problem Relation Age of Onset  . Hypertension Mother   . Hypercholesterolemia Mother   . Diabetes Father   . Hypertension Father   . Cancer Father     Social History Social History   Tobacco Use  . Smoking status: Never Smoker  . Smokeless tobacco: Never Used  Vaping Use  . Vaping Use: Never used  Substance Use Topics  . Alcohol use: Never  . Drug use: Never     Allergies   Azithromycin,  Peanut-containing drug products, Contrast media [iodinated diagnostic agents], Other, Amoxicillin, Fish allergy, Gluten meal, Ibuprofen, and Pantoprazole   Review of Systems Review of Systems  Constitutional: Positive for fever. Negative for chills.  HENT: Positive for congestion. Negative for ear pain and sore throat.   Eyes: Negative for pain and visual disturbance.  Respiratory: Positive for cough. Negative for shortness of breath and wheezing.   Cardiovascular: Negative for chest pain and palpitations.  Gastrointestinal: Positive for vomiting. Negative for abdominal pain and diarrhea.  Genitourinary: Negative for dysuria and hematuria.  Musculoskeletal: Negative for arthralgias and back pain.  Skin: Negative for color change and rash.  Neurological: Negative for seizures and syncope.  All other systems reviewed and are negative.    Physical Exam Triage Vital Signs ED Triage Vitals  Enc Vitals Group     BP      Pulse      Resp      Temp      Temp src      SpO2      Weight      Height      Head Circumference      Peak Flow      Pain Score      Pain Loc      Pain Edu?      Excl. in [place]?    No data found.  Updated Vital Signs BP 123/81 (BP Location: Right Wrist)   Pulse 97   Temp 97.9 F (36.6 C) (Oral)   Resp 20   Ht 5' 2"  (1.575 m)   Wt 210 lb (95.3 kg)   LMP  (LMP Unknown) Comment: reports not having periods  SpO2 98%   BMI 38.41 kg/m   Visual Acuity Right Eye Distance:   Left Eye Distance:   Bilateral Distance:    Right Eye Near:   Left Eye Near:    Bilateral Near:     Physical Exam Vitals and nursing note reviewed.  Constitutional:      General: She is not in acute distress.    Appearance: She is well-developed and well-nourished. She is obese. She is not ill-appearing.  HENT:     Head: Normocephalic and atraumatic.     Right Ear: Tympanic membrane normal.     Left Ear: Tympanic membrane normal.     Nose: Nose normal.     Mouth/Throat:      Mouth: Mucous membranes are moist.     Pharynx: Oropharynx is clear.  Eyes:     Conjunctiva/sclera: Conjunctivae normal.  Cardiovascular:     Rate and Rhythm: Normal rate and regular rhythm.     Heart sounds: Normal heart sounds.  Pulmonary:     Effort: Pulmonary effort is normal. No respiratory distress.     Breath sounds:  Normal breath sounds.  Abdominal:     Palpations: Abdomen is soft.     Tenderness: There is no abdominal tenderness.  Musculoskeletal:        General: No edema.     Cervical back: Neck supple.  Skin:    General: Skin is warm and dry.  Neurological:     General: No focal deficit present.     Mental Status: She is alert and oriented to person, place, and time.     Gait: Gait normal.  Psychiatric:        Mood and Affect: Mood and affect and mood normal.        Behavior: Behavior normal.      UC Treatments / Results  Labs (all labs ordered are listed, but only abnormal results are displayed) Labs Reviewed  SARS CORONAVIRUS 2 (TAT 6-24 HRS)  POC INFLUENZA A AND B ANTIGEN (URGENT CARE ONLY)    EKG   Radiology DG Chest 2 View  Result Date: 03/01/2020 CLINICAL DATA:  Fever, productive cough. EXAM: CHEST - 2 VIEW COMPARISON:  October 26, 2019. FINDINGS: The heart size and mediastinal contours are within normal limits. Both lungs are clear. No visible pleural effusions or pneumothorax. No acute osseous abnormality. Cholecystectomy clips. IMPRESSION: No active cardiopulmonary disease. Electronically Signed   By: Margaretha Sheffield MD   On: 03/01/2020 10:27    Procedures Procedures (including critical care time)  Medications Ordered in UC Medications - No data to display  Initial Impression / Assessment and Plan / UC Course  I have reviewed the triage vital signs and the nursing notes.  Pertinent labs & imaging results that were available during my care of the patient were reviewed by me and considered in my medical decision making (see chart for  details).   Viral URI with cough.  Patient is well-appearing, in no respiratory distress, her exam is reassuring, O2 sat 98%.  CXR normal.  Rapid flu negative.  PCR COVID pending.  Instructed patient to continue symptomatic treatment as previously prescribed.  Cautioned patient to take Tylenol only as directed on the label as she reports she has been taking more than is recommended.  Strict precautions for going to the ED if she has shortness of breath or other concerning symptoms.  Instructed her to follow-up with her PCP if her symptoms are not improving.  She agrees to plan of care.   Final Clinical Impressions(s) / UC Diagnoses   Final diagnoses:  Viral URI with cough     Discharge Instructions     Go to the emergency department if you have acute shortness of breath or other concerning symptoms.  Take Tylenol as directed on the label as needed for fever.  Your chest xray is normal.   Your flu test is negative.    Your COVID test is pending.  You should self quarantine until the test result is back.    Follow up with your primary care provider if your symptoms are not improving.          ED Prescriptions    None     PDMP not reviewed this encounter.   Sharion Balloon, NP 03/01/20 1048

## 2020-03-06 NOTE — Progress Notes (Deleted)
Office Visit Note  Patient: [name]             Date of Birth: 1995-11-24           MRN: [id number]             PCP: [facility] [place] Referring: Simona Huh, NP Visit Date: 03/07/2020 Occupation: @GUAROCC @  Subjective:  No chief complaint on file.   History of Present Illness: [name] is a 25 y.o. female here for evaluation and management of lupus. She currently takes HCQ. Previously reported symptoms include fever and joint pain.***   Labs reviewed 10/2019 Ferritin 17.2 Uric acid 4.9 HCV neg Vit D 11.42 ESR 42 Folate 2.56 TSH 1.11 T3 wnl Iron 14 (low) TIBC 516 (high) ANA neg PTH 92.5 (high) RF neg CMP wnl CBC Hgb 11.0 Plts 436 CCP neg   Activities of Daily Living:  Patient reports morning stiffness for *** {minute/hour:19697}.   Patient {ACTIONS;DENIES/REPORTS:21021675::"Denies"} nocturnal pain.  Difficulty dressing/grooming: {ACTIONS;DENIES/REPORTS:21021675::"Denies"} Difficulty climbing stairs: {ACTIONS;DENIES/REPORTS:21021675::"Denies"} Difficulty getting out of chair: {ACTIONS;DENIES/REPORTS:21021675::"Denies"} Difficulty using hands for taps, buttons, cutlery, and/or writing: {ACTIONS;DENIES/REPORTS:21021675::"Denies"}  No Rheumatology ROS completed.   PMFS History:  There are no problems to display for this patient.   Past Medical History:  Diagnosis Date  . Asthma   . Celiac disease   . Endometriosis   . Lupus ([place])   . Ovarian cyst   . Pulmonary embolus ([place]) 02/06/2020  . Ulcerative colitis ([place])     Family History  Problem Relation Age of Onset  . Hypertension Mother   . Hypercholesterolemia Mother   . Diabetes Father   . Hypertension Father   . Cancer Father    Past Surgical History:  Procedure Laterality Date  . APPENDECTOMY    . CHOLECYSTECTOMY     Social History   Social History Narrative  . Not on file    There is no immunization history on file for this patient.   Objective: Vital Signs: LMP  (LMP  Unknown) Comment: reports not having periods   Physical Exam   Musculoskeletal Exam: ***  CDAI Exam: CDAI Score: -- Patient Global: --; Provider Global: -- Swollen: --; Tender: -- Joint Exam 03/07/2020   No joint exam has been documented for this visit   There is currently no information documented on the homunculus. Go to the Rheumatology activity and complete the homunculus joint exam.  Investigation: No additional findings.  Imaging: DG Chest 2 View  Result Date: 03/01/2020 CLINICAL DATA:  Fever, productive cough. EXAM: CHEST - 2 VIEW COMPARISON:  October 26, 2019. FINDINGS: The heart size and mediastinal contours are within normal limits. Both lungs are clear. No visible pleural effusions or pneumothorax. No acute osseous abnormality. Cholecystectomy clips. IMPRESSION: No active cardiopulmonary disease. Electronically Signed   By: Margaretha Sheffield MD   On: 03/01/2020 10:27    Recent Labs: Lab Results  Component Value Date   WBC 12.0 (H) 12/31/2019   HGB 11.3 (L) 12/31/2019   PLT 511 (H) 12/31/2019   NA 140 12/31/2019   K 3.5 12/31/2019   CL 109 12/31/2019   CO2 18 (L) 12/31/2019   GLUCOSE 165 (H) 12/31/2019   BUN 10 12/31/2019   CREATININE 0.93 12/31/2019   BILITOT 0.4 12/31/2019   ALKPHOS 68 12/31/2019   AST 18 12/31/2019   ALT 12 12/31/2019   PROT 6.8 12/31/2019   ALBUMIN 3.6 12/31/2019   CALCIUM 9.4 12/31/2019   GFRAA >60 06/18/2019    Speciality Comments: No specialty  comments available.  Procedures:  No procedures performed Allergies: Azithromycin, Peanut-containing drug products, Contrast media [iodinated diagnostic agents], Other, Amoxicillin, Fish allergy, Gluten meal, Ibuprofen, and Pantoprazole   Assessment / Plan:     Visit Diagnoses: No diagnosis found.  Orders: No orders of the defined types were placed in this encounter.  No orders of the defined types were placed in this encounter.   Face-to-face time spent with patient was *** minutes.  Greater than 50% of time was spent in counseling and coordination of care.  Follow-Up Instructions: No follow-ups on file.   Collier Salina, MD  Note - This record has been created using [employer].  Chart creation errors have been sought, but may not always  have been located. Such creation errors do not reflect on  the standard of medical care.

## 2020-03-07 ENCOUNTER — Ambulatory Visit: Payer: PPO | Admitting: Internal Medicine

## 2020-03-18 ENCOUNTER — Ambulatory Visit: Payer: Self-pay

## 2020-03-18 ENCOUNTER — Emergency Department (HOSPITAL_COMMUNITY): Payer: PPO

## 2020-03-18 ENCOUNTER — Emergency Department (HOSPITAL_COMMUNITY)
Admission: EM | Admit: 2020-03-18 | Discharge: 2020-03-18 | Disposition: A | Discharge status: Home / Self Care | Payer: PPO | Attending: Emergency Medicine | Admitting: Emergency Medicine

## 2020-03-18 ENCOUNTER — Other Ambulatory Visit: Payer: Self-pay

## 2020-03-18 ENCOUNTER — Encounter (HOSPITAL_COMMUNITY): Payer: Self-pay

## 2020-03-18 DIAGNOSIS — Z79899 Other long term (current) drug therapy: Secondary | ICD-10-CM | POA: Insufficient documentation

## 2020-03-18 DIAGNOSIS — R519 Headache, unspecified: Secondary | ICD-10-CM | POA: Diagnosis not present

## 2020-03-18 DIAGNOSIS — J45909 Unspecified asthma, uncomplicated: Secondary | ICD-10-CM | POA: Insufficient documentation

## 2020-03-18 DIAGNOSIS — R042 Hemoptysis: Secondary | ICD-10-CM | POA: Diagnosis not present

## 2020-03-18 DIAGNOSIS — Z91010 Allergy to peanuts: Secondary | ICD-10-CM | POA: Insufficient documentation

## 2020-03-18 DIAGNOSIS — R079 Chest pain, unspecified: Secondary | ICD-10-CM | POA: Insufficient documentation

## 2020-03-18 DIAGNOSIS — R000 Tachycardia, unspecified: Secondary | ICD-10-CM | POA: Insufficient documentation

## 2020-03-18 DIAGNOSIS — R0600 Dyspnea, unspecified: Secondary | ICD-10-CM | POA: Diagnosis present

## 2020-03-18 DIAGNOSIS — R197 Diarrhea, unspecified: Secondary | ICD-10-CM | POA: Diagnosis not present

## 2020-03-18 DIAGNOSIS — Z7901 Long term (current) use of anticoagulants: Secondary | ICD-10-CM | POA: Diagnosis not present

## 2020-03-18 DIAGNOSIS — R0602 Shortness of breath: Secondary | ICD-10-CM | POA: Diagnosis not present

## 2020-03-18 LAB — CBC WITH DIFFERENTIAL/PLATELET
Abs Immature Granulocytes: 0.03 10*3/uL (ref 0.00–0.07)
Basophils Absolute: 0 10*3/uL (ref 0.0–0.1)
Basophils Relative: 0 %
Eosinophils Absolute: 0.2 10*3/uL (ref 0.0–0.5)
Eosinophils Relative: 2 %
HCT: 33.1 % — ABNORMAL LOW (ref 36.0–46.0)
Hemoglobin: 10.5 g/dL — ABNORMAL LOW (ref 12.0–15.0)
Immature Granulocytes: 0 %
Lymphocytes Relative: 19 %
Lymphs Abs: 1.8 10*3/uL (ref 0.7–4.0)
MCH: 23.1 pg — ABNORMAL LOW (ref 26.0–34.0)
MCHC: 31.7 g/dL (ref 30.0–36.0)
MCV: 72.9 fL — ABNORMAL LOW (ref 80.0–100.0)
Monocytes Absolute: 0.4 10*3/uL (ref 0.1–1.0)
Monocytes Relative: 5 %
Neutro Abs: 7 10*3/uL (ref 1.7–7.7)
Neutrophils Relative %: 74 %
Platelets: 451 10*3/uL — ABNORMAL HIGH (ref 150–400)
RBC: 4.54 MIL/uL (ref 3.87–5.11)
RDW: 17.4 % — ABNORMAL HIGH (ref 11.5–15.5)
WBC: 9.4 10*3/uL (ref 4.0–10.5)
nRBC: 0 % (ref 0.0–0.2)

## 2020-03-18 LAB — COMPREHENSIVE METABOLIC PANEL
ALT: 11 U/L (ref 0–44)
AST: 14 U/L — ABNORMAL LOW (ref 15–41)
Albumin: 3.9 g/dL (ref 3.5–5.0)
Alkaline Phosphatase: 68 U/L (ref 38–126)
Anion gap: 9 (ref 5–15)
BUN: 8 mg/dL (ref 6–20)
CO2: 21 mmol/L — ABNORMAL LOW (ref 22–32)
Calcium: 8.9 mg/dL (ref 8.9–10.3)
Chloride: 110 mmol/L (ref 98–111)
Creatinine, Ser: 0.8 mg/dL (ref 0.44–1.00)
GFR, Estimated: >60 mL/min (ref >60)
Glucose, Bld: 102 mg/dL — ABNORMAL HIGH (ref 70–99)
Potassium: 3.4 mmol/L — ABNORMAL LOW (ref 3.5–5.1)
Sodium: 140 mmol/L (ref 135–145)
Total Bilirubin: 0.5 mg/dL (ref 0.3–1.2)
Total Protein: 7.4 g/dL (ref 6.5–8.1)

## 2020-03-18 LAB — URINALYSIS, ROUTINE W REFLEX MICROSCOPIC
Bilirubin Urine: NEGATIVE
Glucose, UA: NEGATIVE mg/dL
Hgb urine dipstick: NEGATIVE
Ketones, ur: NEGATIVE mg/dL
Leukocytes,Ua: NEGATIVE
Nitrite: NEGATIVE
Protein, ur: NEGATIVE mg/dL
Specific Gravity, Urine: 1.023 (ref 1.005–1.030)
pH: 5 (ref 5.0–8.0)

## 2020-03-18 LAB — I-STAT BETA HCG BLOOD, ED (MC, WL, AP ONLY): I-stat hCG, quantitative: <5 m[IU]/mL (ref <5)

## 2020-03-18 LAB — LACTIC ACID, PLASMA: Lactic Acid, Venous: 1.3 mmol/L (ref 0.5–1.9)

## 2020-03-18 MED ORDER — DOXYCYCLINE HYCLATE 100 MG PO CAPS: 100.0000 mg | ORAL_CAPSULE | Freq: Two times a day (BID) | ORAL | 0 refills | Status: DC

## 2020-03-18 MED ORDER — DOXYCYCLINE HYCLATE 100 MG PO TABS
100.0000 mg | ORAL_TABLET | Freq: Once | ORAL | Status: AC
  Administered 2020-03-18: 100 mg via ORAL
  Filled 2020-03-18: qty 1

## 2020-03-18 MED ORDER — PROCHLORPERAZINE EDISYLATE 10 MG/2ML IJ SOLN
10.0000 mg | Freq: Once | INTRAMUSCULAR | Status: AC
  Administered 2020-03-18: 10 mg via INTRAVENOUS
  Filled 2020-03-18: qty 2

## 2020-03-18 MED ORDER — IOHEXOL 350 MG/ML SOLN
100.0000 mL | Freq: Once | INTRAVENOUS | Status: AC | PRN
  Administered 2020-03-18: 100 mL via INTRAVENOUS

## 2020-03-18 MED ORDER — DIPHENHYDRAMINE HCL 25 MG PO CAPS: 50.0000 mg | ORAL_CAPSULE | Freq: Once | ORAL | Status: AC

## 2020-03-18 MED ORDER — SODIUM CHLORIDE 0.9 % IV BOLUS
1000.0000 mL | Freq: Once | INTRAVENOUS | Status: AC
  Administered 2020-03-18: 1000 mL via INTRAVENOUS

## 2020-03-18 MED ORDER — DIPHENHYDRAMINE HCL 50 MG/ML IJ SOLN
50.0000 mg | Freq: Once | INTRAMUSCULAR | Status: AC
  Administered 2020-03-18: 50 mg via INTRAVENOUS
  Filled 2020-03-18: qty 1

## 2020-03-18 MED ORDER — HYDROCORTISONE NA SUCCINATE PF 250 MG IJ SOLR
200.0000 mg | Freq: Once | INTRAMUSCULAR | Status: AC
  Administered 2020-03-18: 200 mg via INTRAVENOUS
  Filled 2020-03-18: qty 200

## 2020-03-18 NOTE — Telephone Encounter (Signed)
Pt. Reports she has had PE 3 months ago and "I haven't been taking my blood thinner like I should." Yesterday developed cough, chest pain. Can not take a deep breath. Instructed to call EMS. Refuses. Will have someone take her to ED. Instructed to go now. Reason for Disposition  History of prior "blood clot" in leg or lungs (i.e., deep vein thrombosis, pulmonary embolism)  Answer Assessment - Initial Assessment Questions 1. LOCATION: "Where does it hurt?"       Hurts in middle 2. RADIATION: "Does the pain go anywhere else?" (e.g., into neck, jaw, arms, back)     Neck,head 3. ONSET: "When did the chest pain begin?" (Minutes, hours or days)      Yesterday 4. PATTERN "Does the pain come and go, or has it been constant since it started?"  "Does it get worse with exertion?"      Constant 5. DURATION: "How long does it last" (e.g., seconds, minutes, hours)     Constant 6. SEVERITY: "How bad is the pain?"  (e.g., Scale 1-10; mild, moderate, or severe)    - MILD (1-3): doesn't interfere with normal activities     - MODERATE (4-7): interferes with normal activities or awakens from sleep    - SEVERE (8-10): excruciating pain, unable to do any normal activities       8 7. CARDIAC RISK FACTORS: "Do you have any history of heart problems or risk factors for heart disease?" (e.g., angina, prior heart attack; diabetes, high blood pressure, high cholesterol, smoker, or strong family history of heart disease)     No 8. PULMONARY RISK FACTORS: "Do you have any history of lung disease?"  (e.g., blood clots in lung, asthma, emphysema, birth control pills)     PE 9. CAUSE: "What do you think is causing the chest pain?"     Maybe PE 10. OTHER SYMPTOMS: "Do you have any other symptoms?" (e.g., dizziness, nausea, vomiting, sweating, fever, difficulty breathing, cough)       Cough, feet swelling 11. PREGNANCY: "Is there any chance you are pregnant?" "When was your last menstrual period?"       No  Protocols  used: CHEST PAIN-A-AH

## 2020-03-18 NOTE — Discharge Instructions (Signed)
As discussed, today's evaluation has been generally reassuring.  However, with your shortness of breath, and history of pulmonary embolism, it is important you follow-up with our pulmonology colleagues. There is some evidence today for a recurrence of pneumonia.  Please take your prescribed antibiotics as directed.  Return here for concerning changes in your condition.

## 2020-03-18 NOTE — ED Triage Notes (Signed)
Pt reports that she was recently diagnosed with 4 pulmonary emboli and is prescribed blood thinners, but states she does not take them as prescribed. Pt endorses continued SHOB and wheezing. Pt also endorses N/V and reports there a is blood in her vomit at times. Pt states she has intermittent hematuria and pain with urination. Pt also reports intermittent bilateral leg pain and swelling. Additionally, pt reports severe headache.

## 2020-03-18 NOTE — ED Provider Notes (Signed)
[facility] DEPT Provider Note   CSN: [id number] Arrival date & time: 03/18/20  1319     History Chief Complaint  Patient presents with  . Emesis  . Headache    [name] is a 25 y.o. female.  HPI Adult female with multiple medical issues including anxiety, recently diagnosed pulmonary embolism, recently evaluated bladder lesion now presents with headache, chest pain, dyspnea, hemoptysis and diarrhea.  She notes that she has been taking her Eliquis inconsistently since her diagnosis of pulmonary embolism earlier this year.  She did however, take yesterday's dosing. She notes ongoing chest pain, and has substantially decreased respiratory capacity compared to prior to the diagnosis.  Today, with concern for new hemoptysis, in addition to above concerns and new severe headache, without weakness, confusion, disorientation, vision loss, she presents for evaluation.    Past Medical History:  Diagnosis Date  . Asthma   . Celiac disease   . Endometriosis   . Lupus ([place])   . Ovarian cyst   . Pulmonary embolus ([place]) 02/06/2020  . Ulcerative colitis ([place])     There are no problems to display for this patient.   Past Surgical History:  Procedure Laterality Date  . APPENDECTOMY    . CHOLECYSTECTOMY       OB History   No obstetric history on file.     Family History  Problem Relation Age of Onset  . Hypertension Mother   . Hypercholesterolemia Mother   . Diabetes Father   . Hypertension Father   . Cancer Father     Social History   Tobacco Use  . Smoking status: Never Smoker  . Smokeless tobacco: Never Used  Vaping Use  . Vaping Use: Never used  Substance Use Topics  . Alcohol use: Never  . Drug use: Never    Home Medications Prior to Admission medications   Medication Sig Start Date End Date Taking? Authorizing Provider  doxycycline (VIBRAMYCIN) 100 MG capsule Take 1 capsule (100 mg total) by mouth 2 (two) times daily.  03/18/20  Yes Carmin Muskrat, MD  albuterol (VENTOLIN HFA) 108 (90 Base) MCG/ACT inhaler Inhale 2 puffs into the lungs every 6 (six) hours as needed for wheezing or shortness of breath.    [provider]  ALPRAZolam Duanne Moron) 0.5 MG tablet Take 0.25-0.5 mg by mouth daily as needed for anxiety.    [provider]  apixaban (ELIQUIS) 5 MG TABS tablet Eliquis DVT-PE Treatment 30-Day Starter 5 mg (74 tablets) in dose pack  TAKE 2 TABLETS BY MOUTH TWICE DAILY FOR 7 DAYS THEN TAKE 1 TABLET BY MOUTH TWICE DAILY THEREAFTER    [provider]  cetirizine (ZYRTEC ALLERGY) 10 MG tablet Take 1 tablet (10 mg total) by mouth daily. 02/26/20   Jaynee Eagles, PA-C  diphenhydramine-acetaminophen (TYLENOL PM) 25-500 MG TABS tablet Take 1 tablet by mouth at bedtime as needed (sleep).     [provider]  hydroxychloroquine (PLAQUENIL) 200 MG tablet Take 400 mg by mouth daily.  03/14/18   [provider]  Norethindrone-Ethinyl Estradiol-Fe Biphas (LO LOESTRIN FE) 1 MG-10 MCG / 10 MCG tablet Take 1 tablet by mouth daily.    [provider]  promethazine-dextromethorphan (PROMETHAZINE-DM) 6.25-15 MG/5ML syrup Take 5 mLs by mouth at bedtime as needed for cough. 02/26/20   Jaynee Eagles, PA-C  pseudoephedrine (SUDAFED) 60 MG tablet Take 1 tablet (60 mg total) by mouth every 8 (eight) hours as needed for congestion. 02/26/20   Jaynee Eagles,  PA-C  sertraline (ZOLOFT) 100 MG tablet Take 200 mg by mouth at bedtime.    [provider]  zolpidem (AMBIEN) 10 MG tablet Take 20 mg by mouth at bedtime as needed for sleep.     [provider]  dicyclomine (BENTYL) 20 MG tablet Take 1 tablet (20 mg total) by mouth 2 (two) times daily. 11/29/18 04/11/19  Muthersbaugh, Jarrett Soho, PA-C  Fluticasone Propionate HFA (FLOVENT HFA IN) Inhale into the lungs. Patient not taking: Reported on 11/14/2019  11/14/19  [provider]  promethazine (PHENERGAN) 25 MG tablet Take 25 mg by  mouth every 6 (six) hours as needed for nausea or vomiting.  02/26/20  [provider]    Allergies    Azithromycin, Peanut-containing drug products, Contrast media [iodinated diagnostic agents], Other, Amoxicillin, Fish allergy, Gluten meal, Ibuprofen, and Pantoprazole  Review of Systems   Review of Systems  Constitutional:       Per HPI, otherwise negative  HENT:       Per HPI, otherwise negative  Respiratory:       Per HPI, otherwise negative  Cardiovascular:       Per HPI, otherwise negative  Gastrointestinal: Negative for vomiting.       Ulcerative colitis  Endocrine:       Negative aside from HPI  Genitourinary:       Dysuria  Musculoskeletal:       Per HPI, otherwise negative  Skin: Negative.   Allergic/Immunologic:       Lupus  Neurological: Negative for syncope.  Psychiatric/Behavioral: The patient is nervous/anxious.     Physical Exam Updated Vital Signs BP 118/84   Pulse 99   Temp 98.2 F (36.8 C) (Oral)   Resp 20   LMP  (LMP Unknown) Comment: reports not having periods  SpO2 98%   Physical Exam Vitals and nursing note reviewed.  Constitutional:      General: She is not in acute distress.    Appearance: She is well-developed and well-nourished.  HENT:     Head: Normocephalic and atraumatic.  Eyes:     Extraocular Movements: EOM normal.     Conjunctiva/sclera: Conjunctivae normal.  Cardiovascular:     Rate and Rhythm: Regular rhythm. Tachycardia present.  Pulmonary:     Effort: Pulmonary effort is normal. No respiratory distress.     Breath sounds: Normal breath sounds. No stridor.  Abdominal:     General: There is no distension.     Palpations: Abdomen is soft.     Tenderness: There is no abdominal tenderness. There is no guarding.  Musculoskeletal:        General: No edema.  Skin:    General: Skin is warm and dry.  Neurological:     Mental Status: She is alert and oriented to person, place, and time.     Cranial Nerves: No cranial  nerve deficit.  Psychiatric:        Mood and Affect: Mood and affect normal.     ED Results / Procedures / Treatments   Labs (all labs ordered are listed, but only abnormal results are displayed) Labs Reviewed  COMPREHENSIVE METABOLIC PANEL - Abnormal; Notable for the following components:      Result Value   Potassium 3.4 (*)    CO2 21 (*)    Glucose, Bld 102 (*)    AST 14 (*)    All other components within normal limits  CBC WITH DIFFERENTIAL/PLATELET - Abnormal; Notable for the following components:  Hemoglobin 10.5 (*)    HCT 33.1 (*)    MCV 72.9 (*)    MCH 23.1 (*)    RDW 17.4 (*)    Platelets 451 (*)    All other components within normal limits  LACTIC ACID, PLASMA  URINALYSIS, ROUTINE W REFLEX MICROSCOPIC  I-STAT BETA HCG BLOOD, ED ([facility], [facility], [facility] ONLY)    EKG EKG Interpretation  Date/Time:  Friday March 18 2020 13:57:42 EST Ventricular Rate:  95 PR Interval:    QRS Duration: 94 QT Interval:  372 QTC Calculation: 468 R Axis:   79 Text Interpretation: Sinus rhythm ST-t wave abnormality Abnormal ECG Confirmed by Carmin Muskrat [phone]) on 03/18/2020 2:31:35 PM   Radiology CT Angio Chest PE W/Cm &/Or Wo Cm  Result Date: 03/18/2020 CLINICAL DATA:  Worsening shortness of breath and chest pain today. History pulmonary embolism on chronic anticoagulation. Not recently taking anticoagulation medications. EXAM: CT ANGIOGRAPHY CHEST WITH CONTRAST TECHNIQUE: Multidetector CT imaging of the chest was performed using the standard protocol during bolus administration of intravenous contrast. Multiplanar CT image reconstructions and MIPs were obtained to evaluate the vascular anatomy. CONTRAST:  131m OMNIPAQUE IOHEXOL 350 MG/ML SOLN COMPARISON:  Chest radiographs 03/01/2020. Abdominopelvic CT 11/13/2019. No prior chest CT available. FINDINGS: Cardiovascular: The pulmonary arteries are adequately but not optimally opacified with contrast to the level of the subsegmental branches.  There is no evidence of acute pulmonary embolism. No evidence of acute systemic arterial abnormality. The heart size is normal. There is no pericardial effusion. Mediastinum/Nodes: There are no enlarged mediastinal, hilar or axillary lymph nodes.Prevascular soft tissue most consistent with residual thymic tissue. The thyroid gland, trachea and esophagus demonstrate no significant findings. Lungs/Pleura: No pleural effusion or pneumothorax. There are patchy airspace opacities in both lungs. Dependent components in both lower lobes likely represent atelectasis. There is a more diffuse component in the left upper lobe which could reflect infection. There is a smaller dependent component posteriorly in the right upper lobe. No consolidation or suspicious pulmonary nodularity. Upper abdomen: No significant findings are seen within the visualized upper abdomen. There is a probable cyst in the dome of the liver which appears unchanged. Previous cholecystectomy. Musculoskeletal/Chest wall: There is no chest wall mass or suspicious osseous finding. Review of the MIP images confirms the above findings. IMPRESSION: 1. No evidence of acute pulmonary embolism or other acute vascular findings. 2. Patchy airspace opacities in both lungs with a more diffuse component in the left upper lobe which could reflect infection. Dependent components in both lower lobes likely represent atelectasis. Recommend radiographic follow-up. Electronically Signed   By: WRichardean SaleM.D.   On: 03/18/2020 19:14    Procedures Procedures   Medications Ordered in ED Medications  doxycycline (VIBRA-TABS) tablet 100 mg (has no administration in time range)  hydrocortisone sodium succinate (SOLU-CORTEF) injection 200 mg (200 mg Intravenous Given 03/18/20 1418)  diphenhydrAMINE (BENADRYL) capsule 50 mg ( Oral See Alternative 03/18/20 1708)    Or  diphenhydrAMINE (BENADRYL) injection 50 mg (50 mg Intravenous Given 03/18/20 1708)  sodium chloride  0.9 % bolus 1,000 mL (0 mLs Intravenous Stopped 03/18/20 1709)  prochlorperazine (COMPAZINE) injection 10 mg (10 mg Intravenous Given 03/18/20 1416)  iohexol (OMNIPAQUE) 350 MG/ML injection 100 mL (100 mLs Intravenous Contrast Given 03/18/20 1827)    ED Course  I have reviewed the triage vital signs and the nursing notes.  Pertinent labs & imaging results that were available during my care of the patient were reviewed by me and  considered in my medical decision making (see chart for details).  Patient has been sleeping through the majority of her ED stay after initial intervention with fluids, Compazine. Now, she is awake, alert, speaking clearly, states that she feels better. She and I discussed all findings at length including generally reassuring labs, no substantial hemoglobin abnormalities, and indeed her values are higher than her most recent counts. Patient's CT scan does not demonstrate pulmonary embolism, but is somewhat suspicious for pneumonia.  However, the patient is no hypoxia, no leukocytosis, and this may be residual from recent pulmonary embolism.  In discussing this patient notes that she has recurrent pneumonia, and with this consideration patient will start a course of antibiotics. Without other hemodynamic stability with her endorsement of substantial improvement here she was discharged in stable condition to follow-up with primary care and pulmonology. Final Clinical Impression(s) / ED Diagnoses Final diagnoses:  SOB (shortness of breath)    Rx / DC Orders ED Discharge Orders         Ordered    doxycycline (VIBRAMYCIN) 100 MG capsule  2 times daily        03/18/20 1944           Carmin Muskrat, MD 03/18/20 1946

## 2020-03-19 ENCOUNTER — Ambulatory Visit (HOSPITAL_COMMUNITY)
Admission: EM | Admit: 2020-03-19 | Discharge: 2020-03-19 | Disposition: A | Discharge status: Home / Self Care | Payer: PPO | Attending: Family Medicine | Admitting: Family Medicine

## 2020-03-19 ENCOUNTER — Other Ambulatory Visit: Payer: Self-pay

## 2020-03-19 ENCOUNTER — Encounter (HOSPITAL_COMMUNITY): Payer: Self-pay

## 2020-03-19 DIAGNOSIS — R0602 Shortness of breath: Secondary | ICD-10-CM

## 2020-03-19 DIAGNOSIS — Z86711 Personal history of pulmonary embolism: Secondary | ICD-10-CM | POA: Diagnosis not present

## 2020-03-19 DIAGNOSIS — R059 Cough, unspecified: Secondary | ICD-10-CM

## 2020-03-19 MED ORDER — HYDROCOD POLST-CPM POLST ER 10-8 MG/5ML PO SUER: 5.0000 mL | Freq: Two times a day (BID) | ORAL | 0 refills | Status: DC | PRN

## 2020-03-19 MED ORDER — ALBUTEROL SULFATE (2.5 MG/3ML) 0.083% IN NEBU: 2.5000 mg | INHALATION_SOLUTION | Freq: Four times a day (QID) | RESPIRATORY_TRACT | 12 refills | Status: DC | PRN

## 2020-03-19 NOTE — ED Triage Notes (Signed)
Pt present productive cough with SOB. Symptoms started two days ago. Pt was recently seen at [facility] long with same symptom. Pt states she is still coughing up blood and need a nebulizer machine

## 2020-03-19 NOTE — ED Provider Notes (Signed)
[facility]    CSN: [id number] Arrival date & time: 03/19/20  1016      History   Chief Complaint Chief Complaint  Patient presents with  . Cough  . Shortness of Breath    HPI [name] is a 25 y.o. female.   Here today with 2-day history of productive cough and shortness of breath.  Now also having some mild hemoptysis.  She was seen in the ED yesterday for the symptoms as she was concerned she may be experiencing a another pulmonary embolus.  She was diagnosed with a pulmonary embolus about a month ago started on Eliquis.  Per record review from the ED yesterday CT a performed and showed new opacities bilaterally but no new pulmonary embolus.  Patient was started on doxycycline which she has not yet picked up but plans to do upon leaving clinic today.  Did receive a dose in the ED yesterday.  No fevers this morning, wheezing, chest pain, abdominal pain, nausea, vomiting, diarrhea.  She is also requesting a prescription for a nebulizer machine and nebulizer solution.  Is using her albuterol inhaler as needed with some mild relief.  Also requesting a cough syrup as her hacking cough is very painful at this point.     Past Medical History:  Diagnosis Date  . Asthma   . Celiac disease   . Endometriosis   . Lupus ([place])   . Ovarian cyst   . Pulmonary embolus ([place]) 02/06/2020  . Ulcerative colitis ([place])     There are no problems to display for this patient.   Past Surgical History:  Procedure Laterality Date  . APPENDECTOMY    . CHOLECYSTECTOMY      OB History   No obstetric history on file.      Home Medications    Prior to Admission medications   Medication Sig Start Date End Date Taking? Authorizing Provider  albuterol (PROVENTIL) (2.5 MG/3ML) 0.083% nebulizer solution Take 3 mLs (2.5 mg total) by nebulization every 6 (six) hours as needed for wheezing or shortness of breath. 03/19/20  Yes Volney American, PA-C   chlorpheniramine-HYDROcodone [facility]) 10-8 MG/5ML SUER Take 5 mLs by mouth every 12 (twelve) hours as needed for cough. 03/19/20  Yes Volney American, PA-C  albuterol (VENTOLIN HFA) 108 (90 Base) MCG/ACT inhaler Inhale 2 puffs into the lungs every 6 (six) hours as needed for wheezing or shortness of breath.    [provider]  ALPRAZolam Duanne Moron) 0.5 MG tablet Take 0.25-0.5 mg by mouth daily as needed for anxiety.    [provider]  apixaban (ELIQUIS) 5 MG TABS tablet Eliquis DVT-PE Treatment 30-Day Starter 5 mg (74 tablets) in dose pack  TAKE 2 TABLETS BY MOUTH TWICE DAILY FOR 7 DAYS THEN TAKE 1 TABLET BY MOUTH TWICE DAILY THEREAFTER    [provider]  cetirizine (ZYRTEC ALLERGY) 10 MG tablet Take 1 tablet (10 mg total) by mouth daily. 02/26/20   Jaynee Eagles, PA-C  diphenhydramine-acetaminophen (TYLENOL PM) 25-500 MG TABS tablet Take 1 tablet by mouth at bedtime as needed (sleep).     [provider]  doxycycline (VIBRAMYCIN) 100 MG capsule Take 1 capsule (100 mg total) by mouth 2 (two) times daily. 03/18/20   Carmin Muskrat, MD  hydroxychloroquine (PLAQUENIL) 200 MG tablet Take 400 mg by mouth daily.  03/14/18   [provider]  Norethindrone-Ethinyl Estradiol-Fe Biphas (LO LOESTRIN FE) 1 MG-10 MCG / 10 MCG tablet Take 1 tablet  by mouth daily.    [provider]  promethazine-dextromethorphan (PROMETHAZINE-DM) 6.25-15 MG/5ML syrup Take 5 mLs by mouth at bedtime as needed for cough. 02/26/20   Jaynee Eagles, PA-C  pseudoephedrine (SUDAFED) 60 MG tablet Take 1 tablet (60 mg total) by mouth every 8 (eight) hours as needed for congestion. 02/26/20   Jaynee Eagles, PA-C  sertraline (ZOLOFT) 100 MG tablet Take 200 mg by mouth at bedtime.    [provider]  zolpidem (AMBIEN) 10 MG tablet Take 20 mg by mouth at bedtime as needed for sleep.     [provider]  dicyclomine (BENTYL) 20 MG tablet Take 1 tablet (20 mg  total) by mouth 2 (two) times daily. 11/29/18 04/11/19  Muthersbaugh, Jarrett Soho, PA-C  Fluticasone Propionate HFA (FLOVENT HFA IN) Inhale into the lungs. Patient not taking: Reported on 11/14/2019  11/14/19  [provider]  promethazine (PHENERGAN) 25 MG tablet Take 25 mg by mouth every 6 (six) hours as needed for nausea or vomiting.  02/26/20  [provider]    Family History Family History  Problem Relation Age of Onset  . Hypertension Mother   . Hypercholesterolemia Mother   . Diabetes Father   . Hypertension Father   . Cancer Father     Social History Social History   Tobacco Use  . Smoking status: Never Smoker  . Smokeless tobacco: Never Used  Vaping Use  . Vaping Use: Never used  Substance Use Topics  . Alcohol use: Never  . Drug use: Never     Allergies   Azithromycin, Peanut-containing drug products, Contrast media [iodinated diagnostic agents], Other, Amoxicillin, Fish allergy, Gluten meal, Ibuprofen, and Pantoprazole   Review of Systems Review of Systems Per HPI  Physical Exam Triage Vital Signs ED Triage Vitals  Enc Vitals Group     BP 03/19/20 1045 139/90     Pulse Rate 03/19/20 1045 97     Resp 03/19/20 1045 18     Temp 03/19/20 1045 98.6 F (37 C)     Temp Source 03/19/20 1045 Oral     SpO2 03/19/20 1045 96 %     Weight --      Height --      Head Circumference --      Peak Flow --      Pain Score 03/19/20 1046 0     Pain Loc --      Pain Edu? --      Excl. in [place]? --    No data found.  Updated Vital Signs BP 139/90 (BP Location: Left Arm)   Pulse 97   Temp 98.6 F (37 C) (Oral)   Resp 18   LMP  (LMP Unknown) Comment: reports not having periods  SpO2 96%   Visual Acuity Right Eye Distance:   Left Eye Distance:   Bilateral Distance:    Right Eye Near:   Left Eye Near:    Bilateral Near:     Physical Exam Vitals and nursing note reviewed.  Constitutional:      Appearance: Normal appearance. She is not  ill-appearing.  HENT:     Head: Atraumatic.     Right Ear: Tympanic membrane normal.     Left Ear: Tympanic membrane normal.     Nose: Nose normal.     Mouth/Throat:     Mouth: Mucous membranes are moist.     Pharynx: Posterior oropharyngeal erythema present.  Eyes:     Extraocular Movements: Extraocular movements intact.  Conjunctiva/sclera: Conjunctivae normal.  Cardiovascular:     Rate and Rhythm: Normal rate and regular rhythm.     Heart sounds: Normal heart sounds.  Pulmonary:     Effort: Pulmonary effort is normal. No respiratory distress.     Breath sounds: No wheezing or rales.     Comments: Decreased breath sounds throughout, slightly shallow breathing but unlabored at rest on room air Musculoskeletal:        General: Normal range of motion.     Cervical back: Normal range of motion and neck supple.  Skin:    General: Skin is warm and dry.  Neurological:     Mental Status: She is alert and oriented to person, place, and time.  Psychiatric:        Mood and Affect: Mood normal.        Thought Content: Thought content normal.        Judgment: Judgment normal.      UC Treatments / Results  Labs (all labs ordered are listed, but only abnormal results are displayed) Labs Reviewed - No data to display  EKG   Radiology CT Angio Chest PE W/Cm &/Or Wo Cm  Result Date: 03/18/2020 CLINICAL DATA:  Worsening shortness of breath and chest pain today. History pulmonary embolism on chronic anticoagulation. Not recently taking anticoagulation medications. EXAM: CT ANGIOGRAPHY CHEST WITH CONTRAST TECHNIQUE: Multidetector CT imaging of the chest was performed using the standard protocol during bolus administration of intravenous contrast. Multiplanar CT image reconstructions and MIPs were obtained to evaluate the vascular anatomy. CONTRAST:  110m OMNIPAQUE IOHEXOL 350 MG/ML SOLN COMPARISON:  Chest radiographs 03/01/2020. Abdominopelvic CT 11/13/2019. No prior chest CT available.  FINDINGS: Cardiovascular: The pulmonary arteries are adequately but not optimally opacified with contrast to the level of the subsegmental branches. There is no evidence of acute pulmonary embolism. No evidence of acute systemic arterial abnormality. The heart size is normal. There is no pericardial effusion. Mediastinum/Nodes: There are no enlarged mediastinal, hilar or axillary lymph nodes.Prevascular soft tissue most consistent with residual thymic tissue. The thyroid gland, trachea and esophagus demonstrate no significant findings. Lungs/Pleura: No pleural effusion or pneumothorax. There are patchy airspace opacities in both lungs. Dependent components in both lower lobes likely represent atelectasis. There is a more diffuse component in the left upper lobe which could reflect infection. There is a smaller dependent component posteriorly in the right upper lobe. No consolidation or suspicious pulmonary nodularity. Upper abdomen: No significant findings are seen within the visualized upper abdomen. There is a probable cyst in the dome of the liver which appears unchanged. Previous cholecystectomy. Musculoskeletal/Chest wall: There is no chest wall mass or suspicious osseous finding. Review of the MIP images confirms the above findings. IMPRESSION: 1. No evidence of acute pulmonary embolism or other acute vascular findings. 2. Patchy airspace opacities in both lungs with a more diffuse component in the left upper lobe which could reflect infection. Dependent components in both lower lobes likely represent atelectasis. Recommend radiographic follow-up. Electronically Signed   By: WRichardean SaleM.D.   On: 03/18/2020 19:14    Procedures Procedures (including critical care time)  Medications Ordered in UC Medications - No data to display  Initial Impression / Assessment and Plan / UC Course  I have reviewed the triage vital signs and the nursing notes.  Pertinent labs & imaging results that were  available during my care of the patient were reviewed by me and considered in my medical decision making (see chart for details).  Exam vitals reassuring today, thorough ED evaluation yesterday also reassuring but showing new pneumonia.  Complete full course of doxycycline given yesterday from the ED, will also write Tussionex for as needed use, start Mucinex twice daily, and paper prescription given to take to medical supply for a nebulizer machine.  Albuterol nebulizer solution also sent to the drugstore for as needed use.  Given atelectasis shown on CTA yesterday in the ED, discussed incentive spirometer exercises, taking short walks, sleeping propped up.  Close PCP follow-up recommended and return to the ED if symptoms worsen at any point.  Final Clinical Impressions(s) / UC Diagnoses   Final diagnoses:  Cough  Shortness of breath   Discharge Instructions   None    ED Prescriptions    Medication Sig Dispense Auth. Provider   albuterol (PROVENTIL) (2.5 MG/3ML) 0.083% nebulizer solution Take 3 mLs (2.5 mg total) by nebulization every 6 (six) hours as needed for wheezing or shortness of breath. 75 mL Volney American, PA-C   chlorpheniramine-HYDROcodone [facility]) 10-8 MG/5ML SUER Take 5 mLs by mouth every 12 (twelve) hours as needed for cough. 115 mL Volney American, [place]     PDMP not reviewed this encounter.   Volney American, [place] 03/19/20 1144

## 2020-03-22 ENCOUNTER — Other Ambulatory Visit: Payer: Self-pay

## 2020-03-22 ENCOUNTER — Encounter: Payer: Self-pay | Admitting: Internal Medicine

## 2020-03-22 ENCOUNTER — Ambulatory Visit (INDEPENDENT_AMBULATORY_CARE_PROVIDER_SITE_OTHER): Payer: PPO | Admitting: Internal Medicine

## 2020-03-22 VITALS — BP 137/90 | HR 112 | Temp 97.9°F | Ht 62.0 in | Wt 208.8 lb

## 2020-03-22 DIAGNOSIS — Z8616 Personal history of COVID-19: Secondary | ICD-10-CM | POA: Diagnosis not present

## 2020-03-22 DIAGNOSIS — Z86711 Personal history of pulmonary embolism: Secondary | ICD-10-CM | POA: Diagnosis not present

## 2020-03-22 DIAGNOSIS — M329 Systemic lupus erythematosus, unspecified: Secondary | ICD-10-CM | POA: Diagnosis not present

## 2020-03-22 DIAGNOSIS — Z87898 Personal history of other specified conditions: Secondary | ICD-10-CM

## 2020-03-22 DIAGNOSIS — R9389 Abnormal findings on diagnostic imaging of other specified body structures: Secondary | ICD-10-CM | POA: Diagnosis not present

## 2020-03-22 DIAGNOSIS — Z8709 Personal history of other diseases of the respiratory system: Secondary | ICD-10-CM | POA: Diagnosis not present

## 2020-03-22 DIAGNOSIS — J45909 Unspecified asthma, uncomplicated: Secondary | ICD-10-CM | POA: Insufficient documentation

## 2020-03-22 NOTE — Addendum Note (Signed)
Addended by: Suzzanne Cloud E on: 03/22/2020 03:23 PM   Modules accepted: Orders

## 2020-03-22 NOTE — Progress Notes (Signed)
OV 03/22/2020  Subjective:  Patient ID: [name], female , DOB: [birth year] , age 25 y.o. , MRN: [id number] , ADDRESS: [address] [place] [address] PCP [facility], [place] Patient Care Team: [facility], [place] as PCP - General  This Provider for this visit:    03/22/2020 -   Chief Complaint  Patient presents with   Consult    PNA     HPI [name] 25 y.o. -complicated history.  Patient provides the history along with review of the medical records.  Patient is originally from [place].  She moved to [place] few years ago to attend [employer] school.  She tells me that at the age of 55 which was 7 years ago in 2014 she was diagnosed with lupus.  She is to see a rheumatologist in the [place] area.  She does not know the name.  She was on methotrexate for a few years but then quit taking it 2 years ago when she moved from [place] to [place] in 2020.  She has been on Plaquenil all along.  She cannot tolerate prednisone because of hyperglycemia and admissions.  She is very reluctant to do steroids.  She prefers to be on methotrexate she has an appointment pending with the rheumatologist here according to history.  Then the year 2015 she developed a first pulmonary embolism and was on Xarelto for 3 years.  She says she took it in a diligent fashion till 2018 and then was instructed to stop.  Then in 2020 she moved to [place].  She has been attending school here.  She has been on Plaquenil.  She was doing well and then in the beginning of December 2021 she went back to [place] this time the [place] area to be with the family.  Then in early January 2022 she had hematuria.  Was admitted at [facility].  Apparently she was diagnosed with early-stage precancerous bladder lesion.  She was then discharged with recommendations for observation therapy.  1 day later she got admitted.  She shows me the report from [facility]  February 07, 2020 that shows CT diagnosis of pulmonary embolism in the right interlobar artery right upper lobe and the right lower lobe segmental branches.  She was then started on Eliquis.  She then returned to [facility] [employer].  She is only been taking Eliquis half the time.  Then approximately 2 weeks ago in the middle of February 2022 she was diagnosed with COVID-19 in the college campus.  O micron.  Treated as an outpatient.  Then end of last week she started getting cough with hemoptysis.  On 03/18/2020 she checked herself into the emergency department labs reviewed.  No D-dimer checked.  She had a CT angiogram pulmonary embolism protocol.  I personally visualized the CT no pulmonary embolism but she has new onset pulmonary infiltrates [we have imaging comparison to a few years ago for CT abdomen lung images -that back then did not show any infiltrates].  She was discharged on doxycycline which has been taking the cough is still persistent but only somewhat better.   Resting pulse ox 98% resting heart rate 123/min.  We attempted to walk 3 laps but she she currently complete 2 laps.  Final pulse ox 97% final heart rate 116.  Slow pace she was dyspneic and cough throughout.  She had to be dizzy and had to sit down at the  first lap.  Walking desaturation test done with a finger probe.    CT Chest data personally visualized  CT Angio Chest PE W/Cm &/Or Wo Cm  Result Date: 03/18/2020 CLINICAL DATA:  Worsening shortness of breath and chest pain today. History pulmonary embolism on chronic anticoagulation. Not recently taking anticoagulation medications. EXAM: CT ANGIOGRAPHY CHEST WITH CONTRAST TECHNIQUE: Multidetector CT imaging of the chest was performed using the standard protocol during bolus administration of intravenous contrast. Multiplanar CT image reconstructions and MIPs were obtained to evaluate the vascular anatomy. CONTRAST:  168m OMNIPAQUE IOHEXOL 350 MG/ML SOLN COMPARISON:  Chest  radiographs 03/01/2020. Abdominopelvic CT 11/13/2019. No prior chest CT available. FINDINGS: Cardiovascular: The pulmonary arteries are adequately but not optimally opacified with contrast to the level of the subsegmental branches. There is no evidence of acute pulmonary embolism. No evidence of acute systemic arterial abnormality. The heart size is normal. There is no pericardial effusion. Mediastinum/Nodes: There are no enlarged mediastinal, hilar or axillary lymph nodes.Prevascular soft tissue most consistent with residual thymic tissue. The thyroid gland, trachea and esophagus demonstrate no significant findings. Lungs/Pleura: No pleural effusion or pneumothorax. There are patchy airspace opacities in both lungs. Dependent components in both lower lobes likely represent atelectasis. There is a more diffuse component in the left upper lobe which could reflect infection. There is a smaller dependent component posteriorly in the right upper lobe. No consolidation or suspicious pulmonary nodularity. Upper abdomen: No significant findings are seen within the visualized upper abdomen. There is a probable cyst in the dome of the liver which appears unchanged. Previous cholecystectomy. Musculoskeletal/Chest wall: There is no chest wall mass or suspicious osseous finding. Review of the MIP images confirms the above findings. IMPRESSION: 1. No evidence of acute pulmonary embolism or other acute vascular findings. 2. Patchy airspace opacities in both lungs with a more diffuse component in the left upper lobe which could reflect infection. Dependent components in both lower lobes likely represent atelectasis. Recommend radiographic follow-up. Electronically Signed   By: WRichardean SaleM.D.   On: 03/18/2020 19:14    Results for BLYA, HOLBEN(MRN [id number] as of 03/22/2020 14:38  Ref. Range 03/18/2020 14:06  Eosinophils Absolute Latest Ref Range: 0.0 - 0.5 K/uL 0.2    Results for BRIYANSHI, WAHAB(MRN  [id number] as of 03/22/2020 14:38  Ref. Range 12/31/2019 01:56 03/18/2020 14:08  I-stat hCG, quantitative Latest Ref Range: <5 mIU/mL <5.0 <5.0       has a past medical history of Asthma, Celiac disease, Endometriosis, Lupus ([place], Ovarian cyst, Pulmonary embolus ([place] (02/06/2020), and Ulcerative colitis ([place].   reports that she has never smoked. She has never used smokeless tobacco.  Past Surgical History:  Procedure Laterality Date   APPENDECTOMY     CHOLECYSTECTOMY      Allergies  Allergen Reactions   Azithromycin Anaphylaxis   Peanut-Containing Drug Products Other (See Comments)    Unknown ; treenuts, shell fish   Iodinated Diagnostic Agents Hives and Itching    02/04/2020 Patient stated has history of itching and hives with CT IV Iodine contrast, premedicated with Benadryl 25 mg IV prior to CT IV Iodine scan today.Post CT scan patient reported itching, Benadryl 25 mg IV given, symptoms resolved, NP recommends for patient to get premedicated with Benadryl 50 mg IV prior to future CT IV Iodine scans.    Other Other (See Comments)    Moderna covid vaccine -Syncope    Amoxicillin Swelling    Hand swelling    Fish  Allergy Other (See Comments)    unknown   Gluten Meal Other (See Comments)    Celiac disease   Ibuprofen Nausea And Vomiting   Pantoprazole Other (See Comments)    Stomach pain    Immunization History  Administered Date(s) Administered   Influenza, Quadrivalent, Recombinant, Inj, Pf 11/08/2018   Influenza,inj,Quad PF,6+ Mos 10/22/2016   Influenza,inj,quad, With Preservative 11/23/2019   Influenza-Unspecified 12/30/2019   Tdap 01/23/2011   Unspecified SARS-COV-2 Vaccination 03/23/2019   Varicella 12/15/2019    Family History  Problem Relation Age of Onset   Hypertension Mother    Hypercholesterolemia Mother    Diabetes Father    Hypertension Father    Cancer Father      Current Outpatient Medications:    albuterol (PROVENTIL)  (2.5 MG/3ML) 0.083% nebulizer solution, Take 3 mLs (2.5 mg total) by nebulization every 6 (six) hours as needed for wheezing or shortness of breath., Disp: 75 mL, Rfl: 12   albuterol (VENTOLIN HFA) 108 (90 Base) MCG/ACT inhaler, Inhale 2 puffs into the lungs every 6 (six) hours as needed for wheezing or shortness of breath., Disp: , Rfl:    ALPRAZolam (XANAX) 0.5 MG tablet, Take 0.25-0.5 mg by mouth daily as needed for anxiety., Disp: , Rfl:    apixaban (ELIQUIS) 5 MG TABS tablet, Eliquis DVT-PE Treatment 30-Day Starter 5 mg (74 tablets) in dose pack  TAKE 2 TABLETS BY MOUTH TWICE DAILY FOR 7 DAYS THEN TAKE 1 TABLET BY MOUTH TWICE DAILY THEREAFTER, Disp: , Rfl:    chlorpheniramine-HYDROcodone (TUSSIONEX PENNKINETIC ER) 10-8 MG/5ML SUER, Take 5 mLs by mouth every 12 (twelve) hours as needed for cough., Disp: 115 mL, Rfl: 0   doxycycline (VIBRAMYCIN) 100 MG capsule, Take 1 capsule (100 mg total) by mouth 2 (two) times daily., Disp: 14 capsule, Rfl: 0   hydroxychloroquine (PLAQUENIL) 200 MG tablet, Take 400 mg by mouth daily. , Disp: , Rfl:    Norethindrone-Ethinyl Estradiol-Fe Biphas (LO LOESTRIN FE) 1 MG-10 MCG / 10 MCG tablet, Take 1 tablet by mouth daily., Disp: , Rfl:    sertraline (ZOLOFT) 100 MG tablet, Take 200 mg by mouth at bedtime., Disp: , Rfl:    zolpidem (AMBIEN) 10 MG tablet, Take 20 mg by mouth at bedtime as needed for sleep. , Disp: , Rfl:       Objective:   Vitals:   03/22/20 1429  BP: 137/90  Pulse: (!) 112  Temp: 97.9 F (36.6 C)  TempSrc: Oral  SpO2: 98%  Weight: 208 lb 12.8 oz (94.7 kg)  Height: 5' 2" (1.575 m)    Estimated body mass index is 38.19 kg/m as calculated from the following:   Height as of this encounter: 5' 2" (1.575 m).   Weight as of this encounter: 208 lb 12.8 oz (94.7 kg).  _0 @  [employer]   03/22/20 1429  Weight: 208 lb 12.8 oz (94.7 kg)     Physical Exam  General Appearance:    Alert, cooperative, no distress,  appears stated age - yes , Deconditioned looking - no , OBESE  - yes, Sitting on Wheelchair -  no  Head:    Normocephalic, without obvious abnormality, atraumatic  Eyes:    PERRL, conjunctiva/corneas clear,  Ears:    Normal TM's and external ear canals, both ears  Nose:   Nares normal, septum midline, mucosa normal, no drainage    or sinus tenderness. OXYGEN ON  - no . Patient is @ ra   Throat:   Lips,  mucosa, and tongue normal; teeth and gums normal. Cyanosis on lips - no  Neck:   Supple, symmetrical, trachea midline, no adenopathy;    thyroid:  no enlargement/tenderness/nodules; no carotid   bruit or JVD  Back:     Symmetric, no curvature, ROM normal, no CVA tenderness  Lungs:     Distress - no , Wheeze no, Barrell Chest - no, Purse lip breathing - no, Crackles - no   Chest Wall:    No tenderness or deformity.    Heart:    Regular rate and rhythm, S1 and S2 normal, no rub   or gallop, Murmur - no  Breast Exam:    NOT DONE  Abdomen:     Soft, non-tender, bowel sounds active all four quadrants,    no masses, no organomegaly. Visceral obesity - yes  Genitalia:   NOT DONE  Rectal:   NOT DONE  Extremities:   Extremities - normal, Has Cane - no, Clubbing - no, Edema - no  Pulses:   2+ and symmetric all extremities  Skin:   Stigmata of Connective Tissue Disease - dry skinin hand  Lymph nodes:   Cervical, supraclavicular, and axillary nodes normal  Psychiatric:  Neurologic:   Pleasant - yes, Anxious - no, Flat affect - no  CAm-ICU - neg, Alert and Oriented x 3 - yes, Moves all 4s - yes, Speech - normal, Cognition - intact       Assessment:       ICD-10-CM   1. History of persistent cough  Z87.09   2. Abnormal CT of the chest  R93.89   3. Personal history of COVID-19  Z86.16   4. History of pulmonary embolus (PE)  Z86.711   5. History of systemic lupus erythematosus (SLE) ([place])  M32.9    I think she might be having active lupus based on the fact she had pulmonary embolism in mid  January 2022 and then in the COVID-19 mid February 2022 probably flared up the lungs causing current pulmonary inflammation with infiltrates and the cough.  The hemoptysis most recently on 03/18/2020 itself could be due to the active coughing and bronchitis.  At this point in time investigation needs to sort out active lupus.  For the pulmonary inflammation itself we could consider steroids but I would rather get some testing before we committed to steroids.  She has significant intolerance to steroids and therefore a supportive approach might be better.  She is also fairly deconditioned.     Plan:     Patient Instructions     ICD-10-CM   1. History of persistent cough  Z87.09   2. Abnormal CT of the chest  R93.89   3. Personal history of COVID-19  Z86.16   4. History of pulmonary embolus (PE)  Z86.711   5. History of systemic lupus erythematosus (SLE) ([facility])  M32.9    You had lupus since age 9 -5 years ago  Get first pulmonary embolus 6 years ago  Your second pulmonary embolus in January 2021 -after which she only 50% compliant with Eliquis  You had COVID mid February 2022 treated as outpatient  After this on 03/18/2020 ended up in the emergency department with hemoptysis -investigation showed abnormal CT scan with infiltrates  My overall suspicion is that you have active lupus or that Covid has flared up your lungs and causing pulmonary inflammation.  Glad there was no third episode of pulmonary embolism. The coughing up of blood could be related to the  active coughing or just acute bronchitis  It may take several weeks to few months to get a clear picture  Plan  -We need to determine if the lupus itself is active  -Glad you are going to see rheumatology but today please do blood test ESR, QuantiFERON gold, ANA, double-stranded DNA, complement 3, complement 4, CH50, rheumatoid factor, CCP, lupus anticoagulant, d-dimer  -Check for other diseases that might be causing cough  -Check  CBC with differential, blood IgE  -Check Covid antibody response  -Check Covid IgG  -Need to be on lifelong anticoagulation  -Continue Eliquis  - do echo next few days to few weeks  -Ultimately will need breathing test but for the next 90 days we cannot do this because of Covid history mid February 2022  -Complete antibiotic course of doxycycline as prescribed by the emergency department  -Overall I would just recommend supportive approach for the next several weeks to few months before we get a clear picture   Follow-up -Return in 3 weeks to see nurse practitioner to ensure that things are going well -Do pulmonary function test mid May 2022 -Return to see Dr. Chase Caller for a 30-minute visit in mid May 2022 after pulmonary function test      SIGNATURE    Dr. Brand Males, M.D., F.C.C.P,  Pulmonary and Critical Care Medicine Staff Physician, [place] Director - Interstitial Lung Disease  Program  [facility] [place] [facility] [place], [place], [address]  Pager: [phone], If no answer or between  15:00h - 7:00h: call [phone] Telephone: [phone]  3:19 PM 03/22/2020

## 2020-03-22 NOTE — Progress Notes (Signed)
Office Visit Note  Patient: [name]             Date of Birth: [birth year]           MRN: [id number]             PCP: [facility] Referring: Simona Huh, NP Visit Date: 03/23/2020 Occupation: Pharmacy student  Subjective:  New Patient (Initial Visit) (Patient is complaining of frequent lupus flares with frequent sickness. Patient is currently on PLQ but patient is interested in switching back to MTX. )   History of Present Illness: [name] is a 25 y.o. female with a history of cerliac disease here for evaluation and treatment of systemic lupus. She was originally diagnosed with lupus in 2014 and has been treated with methotrexate, which she did not tolerate, steroids, which caused her a lot of side effects including 100 lbs weight gain and increased hospitalizations with infection, and plaquenil which she took for multiple years. She first suffered a pulmonary embolism in 2015 and took xarelto for a period of time. 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 She was seen by rheumatology with Dr. Jenita Seashore with [facility] most recently in [place]. However since January of this year she developed a new PE and was started on Eliquis for this. She has had multiple recent hospital admissions with vomiting, and repeated upper respiratory symptoms and with hemoptysis. In February she was found to have COVID and treated outpatient. She saw pulmonology clinic and has had several lab studies drawn for concern of active lupus contributing to respiratory symptoms and clotting. Currently she is feeling poorly most significantly due to the persistent coughing and chest pain associated with this. She is checking her pulse oximetry at home. 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 She also has generalized symptoms with fatigue and joint pains.  Previous lupus  manifestations Alopecia Malar rash and photosensitive peripheral rash Eye inflammation Nasal ulcers Lymphadenopathy Raynaud's Multiple PEs Gastritis/GI inflammation  DMARD HX MTX - GI intolerance HCQ - Current 469m daily Prednisone- hyperglycemia with admissions  Labs reviewed 10/2019 Vitamin D 11.42 PTH 92.50 ESR 42 TSH 1.111 T3 wnl fT4 wnl ANA neg RF neg CBC Hgb 11.0, Plts 436 Ferritin 17.2  02/2018 ANA neg dsDNA neg C3 C4 normal LAC neg CBC counts wnl, MCV 74.9  08/2013 HBV neg HCV neg  Activities of Daily Living:  Patient reports morning stiffness for 1 hour.   Patient Reports nocturnal pain.  Difficulty dressing/grooming: Denies Difficulty climbing stairs: Denies Difficulty getting out of chair: Denies Difficulty using hands for taps, buttons, cutlery, and/or writing: Denies  Review of Systems  Constitutional: Positive for fatigue.  HENT: Positive for mouth dryness and nose dryness. Negative for mouth sores.   Eyes: Positive for itching, visual disturbance and dryness. Negative for pain.  Respiratory: Positive for cough, hemoptysis, shortness of breath and difficulty breathing.   Cardiovascular: Positive for chest pain, palpitations and swelling in legs/feet.  Gastrointestinal: Positive for abdominal pain, blood in stool, constipation and diarrhea.  Endocrine: Negative for increased urination.  Genitourinary: Positive for painful urination.  Musculoskeletal: Positive for arthralgias, joint pain, joint swelling, myalgias, muscle weakness, morning stiffness, muscle tenderness and myalgias.  Skin: Positive for rash. Negative for color change and redness.  Allergic/Immunologic: Positive for susceptible to infections.  Neurological: Positive for dizziness, headaches and weakness. Negative for numbness and  memory loss.  Hematological: Negative for swollen glands.  Psychiatric/Behavioral: Positive for confusion. Negative for sleep disturbance.    PMFS  History:  Patient Active Problem List   Diagnosis Date Noted  . Pneumonia 03/23/2020  . Chronic nausea 03/23/2020  . Asthma 03/22/2020  . Dysuria 02/03/2020  . Hematuria 02/03/2020  . Mass of urinary bladder 01/26/2020  . Allergic rhinitis 05/05/2019  . 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 Endometriosis 03/14/2018  . Long-term use of high-risk medication 07/02/2017  . Lupus anticoagulant positive 07/02/2017  . Personal history of pulmonary embolism 07/02/2017  . Other forms of systemic lupus erythematosus ([place]) 03/12/2017  . Anemia 02/28/2017  . Migraine 02/28/2017  . Dysmenorrhea 08/28/2011  . Gastritis 08/28/2011    Past Medical History:  Diagnosis Date  . Asthma   . Celiac disease   . Endometriosis   . Lupus ([place])   . Ovarian cyst   . Pulmonary embolus ([place]) 02/06/2020  . Ulcerative colitis ([place])     Family History  Problem Relation Age of Onset  . Hypertension Mother   . Hypercholesterolemia Mother   . Rheum arthritis Mother   . Diabetes Father   . Hypertension Father   . Cancer Father   . Autism Brother    Past Surgical History:  Procedure Laterality Date  . APPENDECTOMY    . CHOLECYSTECTOMY    . EXCISION OF ENDOMETRIOMA     Ovarian cyst removal  . FOOT FRACTURE SURGERY     w/ hardware  . HERNIA REPAIR    . TONSILLECTOMY     Social History   Social History Narrative  . Not on file   Immunization History  Administered Date(s) Administered  . Influenza, Quadrivalent, Recombinant, Inj, Pf 11/08/2018  . Influenza,inj,Quad PF,6+ Mos 10/22/2016  . Influenza,inj,quad, With Preservative 11/23/2019  . Influenza-Unspecified 12/30/2019  . Tdap 01/23/2011  . Unspecified SARS-COV-2 Vaccination 03/23/2019  . Varicella 12/15/2019     Objective: Vital Signs: BP 134/72 (BP Location: Left Arm, Patient Position: Sitting, Cuff Size: Normal)   Pulse (!) 106   Ht 5' 1.25" (1.556 m)   Wt 257 lb 12.8 oz (116.9 kg)   LMP  (LMP Unknown)   BMI 48.31 kg/m     Physical Exam Constitutional:      Appearance: She is obese.  HENT:     Right Ear: External ear normal.     Left Ear: External ear normal.     Mouth/Throat:     Mouth: Mucous membranes are moist.     Pharynx: Oropharynx is clear.  Eyes:     Conjunctiva/sclera: Conjunctivae normal.  Cardiovascular:     Rate and Rhythm: Regular rhythm. Tachycardia present.  Pulmonary:     Effort: Pulmonary effort is normal.     Breath sounds: Normal breath sounds.  Skin:    General: Skin is warm and dry.     Findings: No rash.  Neurological:     General: No focal deficit present.     Mental Status: She is alert.  Psychiatric:        Mood and Affect: Mood normal.     Musculoskeletal Exam:  Neck full ROM no tenderness Shoulders full ROM no tenderness or swelling Elbows full ROM no tenderness or swelling Wrists full ROM no tenderness or swelling Fingers full ROM, tenderness over MCP joints no swelling Knees full ROM no tenderness or swelling Ankles right slightly restricted ROM, pes planus, no swelling MTPs full ROM no tenderness or swelling  Investigation: No additional findings.  Imaging: DG Chest 2 View  Result Date: 03/01/2020 CLINICAL DATA:  Fever, productive cough. EXAM: CHEST - 2 VIEW COMPARISON:  October 26, 2019. FINDINGS: The heart size and mediastinal contours are within normal limits. Both lungs are clear. No visible pleural effusions or pneumothorax. No acute osseous abnormality. Cholecystectomy clips. IMPRESSION: No active cardiopulmonary disease. Electronically Signed   By: Margaretha Sheffield MD   On: 03/01/2020 10:27   CT Angio Chest PE W/Cm &/Or Wo Cm  Result Date: 03/18/2020 CLINICAL DATA:  Worsening shortness of breath and chest pain today. History pulmonary embolism on chronic anticoagulation. Not recently taking anticoagulation medications. EXAM: CT ANGIOGRAPHY CHEST WITH CONTRAST TECHNIQUE: Multidetector CT imaging of the chest was performed using the standard protocol  during bolus administration of intravenous contrast. Multiplanar CT image reconstructions and MIPs were obtained to evaluate the vascular anatomy. CONTRAST:  170m OMNIPAQUE IOHEXOL 350 MG/ML SOLN COMPARISON:  Chest radiographs 03/01/2020. Abdominopelvic CT 11/13/2019. No prior chest CT available. FINDINGS: Cardiovascular: The pulmonary arteries are adequately but not optimally opacified with contrast to the level of the subsegmental branches. There is no evidence of acute pulmonary embolism. No evidence of acute systemic arterial abnormality. The heart size is normal. There is no pericardial effusion. Mediastinum/Nodes: There are no enlarged mediastinal, hilar or axillary lymph nodes.Prevascular soft tissue most consistent with residual thymic tissue. The thyroid gland, trachea and esophagus demonstrate no significant findings. Lungs/Pleura: No pleural effusion or pneumothorax. There are patchy airspace opacities in both lungs. Dependent components in both lower lobes likely represent atelectasis. There is a more diffuse component in the left upper lobe which could reflect infection. There is a smaller dependent component posteriorly in the right upper lobe. No consolidation or suspicious pulmonary nodularity. Upper abdomen: No significant findings are seen within the visualized upper abdomen. There is a probable cyst in the dome of the liver which appears unchanged. Previous cholecystectomy. Musculoskeletal/Chest wall: There is no chest wall mass or suspicious osseous finding. Review of the MIP images confirms the above findings. IMPRESSION: 1. No evidence of acute pulmonary embolism or other acute vascular findings. 2. Patchy airspace opacities in both lungs with a more diffuse component in the left upper lobe which could reflect infection. Dependent components in both lower lobes likely represent atelectasis. Recommend radiographic follow-up. Electronically Signed   By: WRichardean SaleM.D.   On: 03/18/2020  19:14    Recent Labs: Lab Results  Component Value Date   WBC 9.4 03/18/2020   HGB 10.5 (L) 03/18/2020   PLT 451 (H) 03/18/2020   NA 140 03/18/2020   K 3.4 (L) 03/18/2020   CL 110 03/18/2020   CO2 21 (L) 03/18/2020   GLUCOSE 102 (H) 03/18/2020   BUN 8 03/18/2020   CREATININE 0.80 03/18/2020   BILITOT 0.5 03/18/2020   ALKPHOS 68 03/18/2020   AST 14 (L) 03/18/2020   ALT 11 03/18/2020   PROT 7.4 03/18/2020   ALBUMIN 3.9 03/18/2020   CALCIUM 8.9 03/18/2020   GFRAA >60 06/18/2019    Speciality Comments: No specialty comments available.  Procedures:  No procedures performed Allergies: Azithromycin, Peanut-containing drug products, Iodinated diagnostic agents, Other, Amoxicillin, Fish allergy, Gluten meal, Ibuprofen, and Pantoprazole   Assessment / Plan:     Visit Diagnoses: Other forms of systemic lupus erythematosus, unspecified organ involvement status ([place] - Plan: RNP Antibody, Anti-Smith antibody, Sjogrens syndrome-A extractable nuclear antibody, Sjogrens syndrome-B extractable nuclear antibody, C3 and C4, Protein / creatinine ratio, urine, hydroxychloroquine (PLAQUENIL) 200 MG tablet, chlorpheniramine-HYDROcodone (TUSSIONEX PENNKINETIC  ER) 10-8 MG/5ML SUER, promethazine (PHENERGAN) 12.5 MG tablet  Lupus history goes back at least about 7 years with multiple system involvement and repeated hospitalizations.  Previous features include alopecia, mucosal ulcers, malar rash, lymphadenopathy, arthritis, Raynaud's, and multiple blood clots.  She was apparently doing well on just Plaquenil for some time but had tolerated methotrexate before.  We will check updated serology at this time RNP, Smith, [place], SSB, complements, and urine protein creatinine ratio.  We will continue the Plaquenil 400 mg daily. We will need to wait recommend at least 2 weeks after she finishes the current antibiotics before try restarting subcu methotrexate and folic acid.  Long-term use of high-risk medication    Methotrexate requires frequent monitoring for cytopenia or liver toxicity.  She has recent labs for these within normal range with additional labs pending at this time.  Previous hepatitis screening was negative from 2015 and no history for exposures since then.  She has recent chest imaging consistent with Covid related postinflammatory changes or pneumonia but no pulmonary nodules or history of ILD.  Pneumonia due to COVID-19 virus - Plan: chlorpheniramine-HYDROcodone ([facility] [place] ER) 10-8 MG/5ML SURE  She is having significant coughing and chest pain with the recent Covid pneumonia and the PEs will prescribe additional order for the Tussionex she has from her primary office but is going to run out before she has additional follow-up.  Personal history of pulmonary embolism  Now with multiple recurrent pulmonary embolism previously treated with Xarelto now on Eliquis for this episode.  She benefit from repeat testing for antiphospholipid antibodies after the current treatment since this would change recommendation for long-term anticoagulation and would also be potentially better treated with heparin or Coumadin versus DOAC treatment if present.  Chronic nausea - Plan: promethazine (PHENERGAN) 12.5 MG tablet  She has very chronic GI symptoms with nausea and vomiting and diarrhea for years that has usually responded well to promethazine but not zofran, This was apparently attributed to lupus inflammation previously. Will continue this as needed treatment for symptoms that are currently in exacerbation with recent illness and hospitalization.  Orders: Orders Placed This Encounter  Procedures  . RNP Antibody  . Anti-Smith antibody  . Sjogrens syndrome-A extractable nuclear antibody  . Sjogrens syndrome-B extractable nuclear antibody  . C3 and C4  . Protein / creatinine ratio, urine   Meds ordered this encounter  Medications  . hydroxychloroquine (PLAQUENIL) 200 MG tablet     Sig: Take 2 tablets (400 mg total) by mouth daily.    Dispense:  180 tablet    Refill:  0  . DISCONTD: chlorpheniramine-HYDROcodone (TUSSIONEX PENNKINETIC ER) 10-8 MG/5ML SUER    Sig: Take 5 mLs by mouth every 12 (twelve) hours as needed for cough.    Dispense:  115 mL    Refill:  0  . promethazine (PHENERGAN) 12.5 MG tablet    Sig: Take 1 tablet (12.5 mg total) by mouth every 6 (six) hours as needed for nausea or vomiting.    Dispense:  30 tablet    Refill:  2  . chlorpheniramine-HYDROcodone (TUSSIONEX PENNKINETIC ER) 10-8 MG/5ML SUER    Sig: Take 5 mLs by mouth every 12 (twelve) hours as needed for cough.    Dispense:  115 mL    Refill:  0     Follow-Up Instructions: Return in about 3 weeks (around 04/13/2020) for SLE new pt f/u.   Collier Salina, MD  Note - This record has been created using [employer].  Chart creation errors have been sought, but may not always  have been located. Such creation errors do not reflect on  the standard of medical care.

## 2020-03-22 NOTE — Patient Instructions (Addendum)
ICD-10-CM   1. History of persistent cough  Z87.09   2. Abnormal CT of the chest  R93.89   3. Personal history of COVID-19  Z86.16   4. History of pulmonary embolus (PE)  Z86.711   5. History of systemic lupus erythematosus (SLE) (HCC)  M32.9    You had lupus since age 25 -7 years ago  Get first pulmonary embolus 6 years ago  Your second pulmonary embolus in January 2021 -after which she only 50% compliant with Eliquis  You had COVID mid February 2022 treated as outpatient  After this on 03/18/2020 ended up in the emergency department with hemoptysis -investigation showed abnormal CT scan with infiltrates  My overall suspicion is that you have active lupus or that Covid has flared up your lungs and causing pulmonary inflammation.  Glad there was no third episode of pulmonary embolism. The coughing up of blood could be related to the active coughing or just acute bronchitis  It may take several weeks to few months to get a clear picture  Plan  -We need to determine if the lupus itself is active  -Glad you are going to see rheumatology but today please do blood test ESR, QuantiFERON gold, ANA, double-stranded DNA, complement 3, complement 4, CH50, rheumatoid factor, CCP, lupus anticoagulant, d-dimer  -Check for other diseases that might be causing cough  -Check CBC with differential, blood IgE  -Check Covid antibody response  -Check Covid IgG  -Need to be on lifelong anticoagulation  -Continue Eliquis  - do echo next few days to few weeks  -Ultimately will need breathing test but for the next 90 days we cannot do this because of Covid history mid February 2022  -Complete antibiotic course of doxycycline as prescribed by the emergency department  -Overall I would just recommend supportive approach for the next several weeks to few months before we get a clear picture   Follow-up -Return in 3 weeks to see nurse practitioner to ensure that things are going well -Do pulmonary  function test mid May 2022 -Return to see Dr. Chase Caller for a 30-minute visit in mid May 2022 after pulmonary function test

## 2020-03-23 ENCOUNTER — Telehealth: Payer: Self-pay

## 2020-03-23 ENCOUNTER — Ambulatory Visit (INDEPENDENT_AMBULATORY_CARE_PROVIDER_SITE_OTHER): Payer: PPO | Admitting: Internal Medicine

## 2020-03-23 ENCOUNTER — Encounter: Payer: Self-pay | Admitting: Internal Medicine

## 2020-03-23 VITALS — BP 134/72 | HR 106 | Ht 61.25 in | Wt 257.8 lb

## 2020-03-23 DIAGNOSIS — M328 Other forms of systemic lupus erythematosus: Secondary | ICD-10-CM

## 2020-03-23 DIAGNOSIS — U071 COVID-19: Secondary | ICD-10-CM

## 2020-03-23 DIAGNOSIS — J1282 Pneumonia due to coronavirus disease 2019: Secondary | ICD-10-CM

## 2020-03-23 DIAGNOSIS — J189 Pneumonia, unspecified organism: Secondary | ICD-10-CM | POA: Insufficient documentation

## 2020-03-23 DIAGNOSIS — R110 Nausea: Secondary | ICD-10-CM

## 2020-03-23 DIAGNOSIS — Z79899 Other long term (current) drug therapy: Secondary | ICD-10-CM | POA: Diagnosis not present

## 2020-03-23 DIAGNOSIS — Z86711 Personal history of pulmonary embolism: Secondary | ICD-10-CM

## 2020-03-23 LAB — CBC WITH DIFFERENTIAL/PLATELET
Basophils Absolute: 0 10*3/uL (ref 0.0–0.1)
Basophils Relative: 0.4 % (ref 0.0–3.0)
Eosinophils Absolute: 0.2 10*3/uL (ref 0.0–0.7)
Eosinophils Relative: 3.1 % (ref 0.0–5.0)
HCT: 33.6 % — ABNORMAL LOW (ref 36.0–46.0)
Hemoglobin: 10.8 g/dL — ABNORMAL LOW (ref 12.0–15.0)
Lymphocytes Relative: 32.7 % (ref 12.0–46.0)
Lymphs Abs: 2.6 10*3/uL (ref 0.7–4.0)
MCHC: 32.1 g/dL (ref 30.0–36.0)
MCV: 72 fl — ABNORMAL LOW (ref 78.0–100.0)
Monocytes Absolute: 0.5 10*3/uL (ref 0.1–1.0)
Monocytes Relative: 6.9 % (ref 3.0–12.0)
Neutro Abs: 4.4 10*3/uL (ref 1.4–7.7)
Neutrophils Relative %: 56.9 % (ref 43.0–77.0)
Platelets: 418 10*3/uL — ABNORMAL HIGH (ref 150.0–400.0)
RBC: 4.66 Mil/uL (ref 3.87–5.11)
RDW: 18.4 % — ABNORMAL HIGH (ref 11.5–15.5)
WBC: 7.8 10*3/uL (ref 4.0–10.5)

## 2020-03-23 MED ORDER — PROMETHAZINE HCL 12.5 MG PO TABS: 12.5000 mg | ORAL_TABLET | Freq: Four times a day (QID) | ORAL | 2 refills | Status: DC | PRN

## 2020-03-23 MED ORDER — HYDROXYCHLOROQUINE SULFATE 200 MG PO TABS: 400.0000 mg | ORAL_TABLET | Freq: Every day | ORAL | 0 refills | Status: DC

## 2020-03-23 MED ORDER — HYDROCOD POLST-CPM POLST ER 10-8 MG/5ML PO SUER: 5.0000 mL | Freq: Two times a day (BID) | ORAL | 0 refills | Status: DC | PRN

## 2020-03-23 NOTE — Telephone Encounter (Signed)
Patient called stating she is at [facility] up her prescriptions.  Patient states they received the Hydroxychloroquine and phenergan, but they don't have a prescription for the cough syrup.  Patient is asking if that could be resent.  Patient states the pharmacist told her if it was resent she could wait why they filled it.

## 2020-03-23 NOTE — Telephone Encounter (Signed)
Tried re-sending this adrienne is aware. If not received we may need to fax or call them. Could be a problem with the imprivata e-prescribing since it contains hydrocodone. If they don't get it we can call or fax the order.

## 2020-03-23 NOTE — Telephone Encounter (Signed)
Rx has been faxed.

## 2020-03-24 ENCOUNTER — Telehealth: Payer: Self-pay | Admitting: Internal Medicine

## 2020-03-24 LAB — C3 AND C4
C3 Complement: 214 mg/dL — ABNORMAL HIGH (ref 83–193)
C4 Complement: 58 mg/dL — ABNORMAL HIGH (ref 15–57)

## 2020-03-24 LAB — PROTEIN / CREATININE RATIO, URINE
Creatinine, Urine: 140 mg/dL (ref 20–275)
Protein/Creat Ratio: 93 mg/g creat (ref 21–161)
Protein/Creatinine Ratio: 0.093 mg/mg creat (ref 0.021–0.16)
Total Protein, Urine: 13 mg/dL (ref 5–24)

## 2020-03-24 LAB — LUPUS ANTICOAGULANT PANEL
Dilute Viper Venom Time: 44.7 s (ref 0.0–47.0)
PTT Lupus Anticoagulant: 36.5 s (ref 0.0–51.9)

## 2020-03-24 LAB — SJOGRENS SYNDROME-B EXTRACTABLE NUCLEAR ANTIBODY: SSB (La) (ENA) Antibody, IgG: <1 AI

## 2020-03-24 LAB — SJOGRENS SYNDROME-A EXTRACTABLE NUCLEAR ANTIBODY: SSA (Ro) (ENA) Antibody, IgG: <1 AI

## 2020-03-24 LAB — RNP ANTIBODY: Ribonucleic Protein(ENA) Antibody, IgG: <1 AI

## 2020-03-24 LAB — ANTI-SMITH ANTIBODY: ENA SM Ab Ser-aCnc: <1 AI

## 2020-03-24 MED ORDER — PREDNISONE 10 MG PO TABS: ORAL_TABLET | ORAL | 0 refills | Status: DC

## 2020-03-24 NOTE — Telephone Encounter (Signed)
Called and spoke with pt who states she still has complaints of a cough which she says is about the same as it was at [place] with MR 3/1. Pt did go pick up the tussionex Rx that was sent to the pharmacy for her and said she will take that as needed.  Pt states that she also has complaints of wheezing and also has tightness in her chest from coughing.  Pt states due to her coughing and increased SOB, when ambulating she does have to stop after walking about 100yards to get her breath.  Pt is still on doxycycline which was prescribed for her 2/25 after pt went to the ED. Pt didn't start taking the ABX until 03/19/20.  Pt stated yesterday 3/2 that she had to do at least 7 neb treatments to help with her symptoms. Pt did not use her rescue inhaler.  Pt took her temp today 3/3 and stated that her temp was 99.0. pt took tylenol about 1 hour ago.  Pt also stated overnight 3/2, she did wake up due to her Apple Watch alarming due to her having low O2 sats and when she woke up, she saw that the pulse ox was reading 87% on room air.  With all that is currently going on with pt, she wants to know what we recommend. MR, please advise.

## 2020-03-24 NOTE — Telephone Encounter (Signed)
Called and spoke with pt and she is aware of MR recs.  She stated that she did not feel that her BS would hold out on a very high dose of prednisone.  She stated that if he were to give her the lowest dose that would help her she may be able to get by with that.  MR please advise. Thanks

## 2020-03-24 NOTE — Telephone Encounter (Signed)
Her complement levels are not low.  That would suggest that her lupus is not active.  I drew some of these lupus labs to help the visit with her rheumatologist to go smoother and faster.  She needs to address these lupus labs with the rheumatologist.  Because of a history of lupus she definitely needs to keep the appointment with the rheumatologist and not miss it.

## 2020-03-24 NOTE — Telephone Encounter (Signed)
Please advise on patient mychart message  Question regarding COMPLEMENT TOTAL, is this a good or a bad thing?

## 2020-03-24 NOTE — Telephone Encounter (Signed)
If she she taking her Eliquis in a very compliant fashion?  The problem is that we cannot do any pulmonary function testing for 90 days because she had COVID in the middle of February 2022  Her blood IgE and blood eosinophils are normal suggesting may be she does not have significant active asthma but that is the best we can do because unable to do breathing test  Plan -Can give empiric prednisone for 8 days if her blood sugar can handle it?  Please find out  Results for RONNISHA, FELBER (MRN [id number]) as of 03/24/2020 15:37  Ref. Range 03/18/2020 14:06 03/22/2020 15:24  Eosinophils Absolute Latest Ref Range: 0.0 - 0.7 K/uL 0.2 0.2      LABS    PULMONARY No results for input(s): PHART, PCO2ART, PO2ART, HCO3, TCO2, O2SAT in the last 168 hours.  Invalid input(s): PCO2, PO2  CBC Recent Labs  Lab 03/18/20 1406 03/22/20 1524  HGB 10.5* 10.8*  HCT 33.1* 33.6*  WBC 9.4 7.8  PLT 451* 418.0*    COAGULATION No results for input(s): INR in the last 168 hours.  CARDIAC  No results for input(s): TROPONINI in the last 168 hours. No results for input(s): PROBNP in the last 168 hours.   CHEMISTRY Recent Labs  Lab 03/18/20 1406  NA 140  K 3.4*  CL 110  CO2 21*  GLUCOSE 102*  BUN 8  CREATININE 0.80  CALCIUM 8.9   Estimated Creatinine Clearance: 127.5 mL/min (by C-G formula based on SCr of 0.8 mg/dL).   LIVER Recent Labs  Lab 03/18/20 1406  AST 14*  ALT 11  ALKPHOS 68  BILITOT 0.5  PROT 7.4  ALBUMIN 3.9     INFECTIOUS Recent Labs  Lab 03/18/20 1406  LATICACIDVEN 1.3     ENDOCRINE CBG (last 3)  No results for input(s): GLUCAP in the last 72 hours.       IMAGING x48h  - image(s) personally visualized  -   highlighted in bold No results found.

## 2020-03-24 NOTE — Telephone Encounter (Signed)
I have called and LM on the VM for the pt to call back to make her aware of the prednisone that has been sent to her pharmacy and of MR recs for her.

## 2020-03-24 NOTE — Telephone Encounter (Signed)
Please take prednisone 40 mg x1 day, then 30 mg x1 day, then 20 mg x1 day, then 10 mg x1 day, and then 5 mg x1 day and stop   This is a short course. Sugars should hold but no guarantee. Shorter the course the better.   She should monitor her sugars  Any worsening of cough or resp issues go to ER

## 2020-03-25 NOTE — Telephone Encounter (Signed)
ATC LVMTCB to review Dr. Golden Pop recommendations.

## 2020-03-25 NOTE — Telephone Encounter (Signed)
Tried calling the pt and she did not answer and her VM was full. Will call back next wk since already called x 2 today and then if no answer 3/7 will mail letter.

## 2020-03-28 NOTE — Telephone Encounter (Signed)
Called and spoke with patient to let her know recs of Dr. Chase Caller and to see if she picked up the RX for Prednisone. She stated that she has picked it up but has not started it yet because she wanted to talk to [place] first to make sure there was no other recommendations. Went over all recs with her and advised her to start Prednisone. She expressed understanding. States that she has already met with her Rheumatologist and they have done labs as well and she is waiting to hear back on a few that haven't resulted yet. Advised her to call us with any updates or if she feels worse to let us know or go to urgent care or ER for further evaluation. Nothing further needed.

## 2020-03-28 NOTE — Progress Notes (Signed)
Lab results are negative for markers of systemic lupus disease activity at this time. Her complement test is high this is usually more consistent with a response to infection as the primary issue rather than lupus flare. I definitely want to follow up in a few weeks after she is finished with the antibiotics to discuss starting any new medicines.I agree with treatment and steroids as recommended in her pulmonology clinic for the current lung inflammation involvement.

## 2020-03-29 ENCOUNTER — Telehealth: Payer: Self-pay | Admitting: Pulmonary Disease

## 2020-03-29 NOTE — Telephone Encounter (Signed)
Called by [name] d/t blood glucose = 200 on Prednisone taper for ? Lupus lung disease. She has taken Prednisone 40 mg yesterday and 30 mg today. She reports that she has had problems with Prednisone in the past. (reports seizure and syncope). Advised patient to check blood glucose prior to taking Prednisone tomorrow morning (due to take 20 mg on the taper). If blood glucose > 200, recommend that she call the PCCM office and speak to Dr. Chase Caller prior to taking Prednisone dose. If blood glucose < 200, would not expect blood glucose to go higher given tapering dose of Prednisone.

## 2020-03-30 ENCOUNTER — Telehealth: Payer: Self-pay | Admitting: Internal Medicine

## 2020-03-30 ENCOUNTER — Telehealth: Payer: Self-pay

## 2020-03-30 NOTE — Telephone Encounter (Signed)
I discussed with Ms. Prazak she noticed increase in coughing severity again after having finished the antibiotics last week. She is taking the prednisone taper doses prescribed in pulmonology clinic, currently at 20 mg and 10 mg tomorrow then will be off. Her blood sugars has been very elevated on the steroids into multiple hundreds. She has had some post-tussive emesis. I discussed lbs currently showed high platelets and high complements this is usually more from an infection reaction than a lupus flare. I think her blood sugar should return to baseline after finishing the steroids since not starting any long term medications besides the HCQ, which is associated with neutral effector decrease effect on blood sugars. We plan to follow up in a few weeks no new meds recommended at this time.

## 2020-03-30 NOTE — Telephone Encounter (Signed)
Called pt to check on her. She checked her blood glucose this morning and it was 206 fasting. She has not taken any pred yet today. Forwarding to MR. Please see below from Dr Oletta Darter. Thanks.

## 2020-03-30 NOTE — Telephone Encounter (Signed)
Patient calling to let you know Pulmonologist put her on Prednisone x3 days ago. Blood sugars staying in the 200, patient not feeling well ,coughing more, and sleeping all the time. Patient tried calling Pulmonologist, but they have not gotten back to her. Patient was advised to call here also. Please call patient to discuss.

## 2020-03-30 NOTE — Telephone Encounter (Signed)
Patient's mom Vincente Liberty called stating Regenia Skeeter still has a cough and her blood sugar level has been around 200 the last 2 days.  She is not sure if it is due to the cough medicine.  She requested we return a call to Oliver at [phone]

## 2020-03-30 NOTE — Telephone Encounter (Signed)
Pleas advise.

## 2020-03-30 NOTE — Telephone Encounter (Signed)
Please advise. Patient also called and this was sent in another telephone call encounter.

## 2020-03-31 LAB — COMPLEMENT COMPONENT C5: Complement Component C5: 23.8 mg/dL — ABNORMAL HIGH (ref 6.0–20.0)

## 2020-03-31 LAB — D-DIMER, QUANTITATIVE: D-Dimer, Quant: 0.28 mcg/mL FEU (ref <0.50)

## 2020-03-31 LAB — COMPLEMENT, TOTAL: Compl, Total (CH50): >60 U/mL — ABNORMAL HIGH (ref 31–60)

## 2020-03-31 LAB — ANA: Anti Nuclear Antibody (ANA): NEGATIVE

## 2020-03-31 LAB — QUANTIFERON-TB GOLD PLUS
Mitogen-NIL: >10 IU/mL
NIL: 0.02 IU/mL
QuantiFERON-TB Gold Plus: NEGATIVE
TB1-NIL: 0 IU/mL
TB2-NIL: 0.02 IU/mL

## 2020-03-31 LAB — CYCLIC CITRUL PEPTIDE ANTIBODY, IGG: Cyclic Citrullin Peptide Ab: <16 UNITS

## 2020-03-31 LAB — IGE: IgE (Immunoglobulin E), Serum: 42 kU/L (ref <=114)

## 2020-03-31 LAB — SARS-COV-2 ANTIBODY(IGG)SPIKE,SEMI-QUANTITATIVE: SARS COV1 AB(IGG)SPIKE,SEMI QN: <1 index (ref <1.00)

## 2020-03-31 LAB — ANTI-DNA ANTIBODY, DOUBLE-STRANDED: ds DNA Ab: <1 IU/mL

## 2020-03-31 LAB — RHEUMATOID FACTOR: Rheumatoid fact SerPl-aCnc: <14 IU/mL (ref <14)

## 2020-03-31 NOTE — Telephone Encounter (Signed)
Patient called, she is experiencing increased cough and symptoms. Advised patient, per Dr. Benjamine Mola, to reach out to either PCP or pulmonology to discuss symptoms and potential treatment options.

## 2020-04-01 ENCOUNTER — Institutional Professional Consult (permissible substitution): Payer: PPO | Admitting: Pulmonary Disease

## 2020-04-03 ENCOUNTER — Telehealth: Payer: Self-pay | Admitting: Pulmonary Disease

## 2020-04-03 NOTE — Telephone Encounter (Signed)
Telephone Call - 04/03/20 10:26 PM   Returned patient's call to [facility] answering service.  Patient of Dr. Chase Caller. The patient was seen at urgent care today for pneumonia. She was found to have a fever to 104 F. Unclear what other evaluation was done there but she was told to go to the ED for further workup and management. She is calling now to get the opinion of her Pulmonary physicians' group as to whether she should go to the ED for evaluation.  She tells me that she has been coughing up blood-tinged sputum, and her SpO2 is 90% at rest currently. She is still febrile.  I informed the patient that her vital signs are concerning and she should definitely present to the ED for workup and treatment of presumed pneumonia. She should also be tested for COVID-19. She agreed to come to the ED for evaluation.  Cc'd Dr. Chase Caller on this note.

## 2020-04-04 ENCOUNTER — Inpatient Hospital Stay (HOSPITAL_COMMUNITY)
Admission: EM | Admit: 2020-04-04 | Discharge: 2020-04-06 | DRG: 204 | Disposition: A | Discharge status: Home / Self Care | Payer: PPO | Attending: Family Medicine | Admitting: Family Medicine

## 2020-04-04 ENCOUNTER — Encounter (HOSPITAL_COMMUNITY): Payer: Self-pay | Admitting: Emergency Medicine

## 2020-04-04 ENCOUNTER — Emergency Department (HOSPITAL_COMMUNITY): Payer: PPO

## 2020-04-04 ENCOUNTER — Other Ambulatory Visit: Payer: Self-pay

## 2020-04-04 DIAGNOSIS — K2970 Gastritis, unspecified, without bleeding: Secondary | ICD-10-CM

## 2020-04-04 DIAGNOSIS — J45909 Unspecified asthma, uncomplicated: Secondary | ICD-10-CM | POA: Diagnosis present

## 2020-04-04 DIAGNOSIS — R1010 Upper abdominal pain, unspecified: Secondary | ICD-10-CM | POA: Diagnosis not present

## 2020-04-04 DIAGNOSIS — Z86711 Personal history of pulmonary embolism: Secondary | ICD-10-CM | POA: Diagnosis not present

## 2020-04-04 DIAGNOSIS — R1013 Epigastric pain: Secondary | ICD-10-CM

## 2020-04-04 DIAGNOSIS — E872 Acidosis: Secondary | ICD-10-CM | POA: Diagnosis present

## 2020-04-04 DIAGNOSIS — G43909 Migraine, unspecified, not intractable, without status migrainosus: Secondary | ICD-10-CM | POA: Diagnosis present

## 2020-04-04 DIAGNOSIS — K2100 Gastro-esophageal reflux disease with esophagitis, without bleeding: Secondary | ICD-10-CM | POA: Diagnosis present

## 2020-04-04 DIAGNOSIS — F32A Depression, unspecified: Secondary | ICD-10-CM | POA: Diagnosis present

## 2020-04-04 DIAGNOSIS — Z86718 Personal history of other venous thrombosis and embolism: Secondary | ICD-10-CM

## 2020-04-04 DIAGNOSIS — R053 Chronic cough: Secondary | ICD-10-CM | POA: Diagnosis present

## 2020-04-04 DIAGNOSIS — Z833 Family history of diabetes mellitus: Secondary | ICD-10-CM

## 2020-04-04 DIAGNOSIS — G8929 Other chronic pain: Secondary | ICD-10-CM | POA: Diagnosis present

## 2020-04-04 DIAGNOSIS — Z8261 Family history of arthritis: Secondary | ICD-10-CM

## 2020-04-04 DIAGNOSIS — R0609 Other forms of dyspnea: Principal | ICD-10-CM | POA: Diagnosis present

## 2020-04-04 DIAGNOSIS — R110 Nausea: Secondary | ICD-10-CM | POA: Diagnosis present

## 2020-04-04 DIAGNOSIS — J9811 Atelectasis: Secondary | ICD-10-CM | POA: Diagnosis present

## 2020-04-04 DIAGNOSIS — Z79899 Other long term (current) drug therapy: Secondary | ICD-10-CM

## 2020-04-04 DIAGNOSIS — R000 Tachycardia, unspecified: Secondary | ICD-10-CM | POA: Diagnosis present

## 2020-04-04 DIAGNOSIS — I4581 Long QT syndrome: Secondary | ICD-10-CM | POA: Diagnosis present

## 2020-04-04 DIAGNOSIS — K449 Diaphragmatic hernia without obstruction or gangrene: Secondary | ICD-10-CM | POA: Diagnosis present

## 2020-04-04 DIAGNOSIS — R109 Unspecified abdominal pain: Secondary | ICD-10-CM | POA: Diagnosis present

## 2020-04-04 DIAGNOSIS — D649 Anemia, unspecified: Secondary | ICD-10-CM | POA: Diagnosis present

## 2020-04-04 DIAGNOSIS — N809 Endometriosis, unspecified: Secondary | ICD-10-CM | POA: Diagnosis present

## 2020-04-04 DIAGNOSIS — Z7901 Long term (current) use of anticoagulants: Secondary | ICD-10-CM

## 2020-04-04 DIAGNOSIS — F419 Anxiety disorder, unspecified: Secondary | ICD-10-CM | POA: Diagnosis present

## 2020-04-04 DIAGNOSIS — R0602 Shortness of breath: Secondary | ICD-10-CM | POA: Diagnosis not present

## 2020-04-04 DIAGNOSIS — M329 Systemic lupus erythematosus, unspecified: Secondary | ICD-10-CM

## 2020-04-04 DIAGNOSIS — K900 Celiac disease: Secondary | ICD-10-CM | POA: Diagnosis present

## 2020-04-04 DIAGNOSIS — Z20822 Contact with and (suspected) exposure to covid-19: Secondary | ICD-10-CM | POA: Diagnosis present

## 2020-04-04 DIAGNOSIS — Z83438 Family history of other disorder of lipoprotein metabolism and other lipidemia: Secondary | ICD-10-CM

## 2020-04-04 DIAGNOSIS — D75839 Thrombocytosis, unspecified: Secondary | ICD-10-CM | POA: Diagnosis present

## 2020-04-04 DIAGNOSIS — U099 Post covid-19 condition, unspecified: Secondary | ICD-10-CM | POA: Diagnosis present

## 2020-04-04 DIAGNOSIS — Z6841 Body Mass Index (BMI) 40.0 and over, adult: Secondary | ICD-10-CM

## 2020-04-04 DIAGNOSIS — R042 Hemoptysis: Secondary | ICD-10-CM | POA: Diagnosis present

## 2020-04-04 DIAGNOSIS — K254 Chronic or unspecified gastric ulcer with hemorrhage: Secondary | ICD-10-CM | POA: Diagnosis present

## 2020-04-04 DIAGNOSIS — R002 Palpitations: Secondary | ICD-10-CM | POA: Diagnosis present

## 2020-04-04 DIAGNOSIS — E860 Dehydration: Secondary | ICD-10-CM | POA: Diagnosis present

## 2020-04-04 DIAGNOSIS — Z8249 Family history of ischemic heart disease and other diseases of the circulatory system: Secondary | ICD-10-CM

## 2020-04-04 HISTORY — DX: Systemic involvement of connective tissue, unspecified: M35.9

## 2020-04-04 HISTORY — DX: Diarrhea, unspecified: R19.7

## 2020-04-04 LAB — LACTIC ACID, PLASMA
Lactic Acid, Venous: 2 mmol/L (ref 0.5–1.9)
Lactic Acid, Venous: 2.4 mmol/L (ref 0.5–1.9)
Lactic Acid, Venous: 2.6 mmol/L (ref 0.5–1.9)

## 2020-04-04 LAB — RESP PANEL BY RT-PCR (FLU A&B, COVID) ARPGX2
Influenza A by PCR: NEGATIVE
Influenza B by PCR: NEGATIVE
SARS Coronavirus 2 by RT PCR: NEGATIVE

## 2020-04-04 LAB — TROPONIN I (HIGH SENSITIVITY)
Troponin I (High Sensitivity): 5 ng/L (ref <18)
Troponin I (High Sensitivity): 5 ng/L (ref <18)

## 2020-04-04 LAB — COMPREHENSIVE METABOLIC PANEL
ALT: 13 U/L (ref 0–44)
AST: 19 U/L (ref 15–41)
Albumin: 3.6 g/dL (ref 3.5–5.0)
Alkaline Phosphatase: 65 U/L (ref 38–126)
Anion gap: 9 (ref 5–15)
BUN: 11 mg/dL (ref 6–20)
CO2: 21 mmol/L — ABNORMAL LOW (ref 22–32)
Calcium: 9.4 mg/dL (ref 8.9–10.3)
Chloride: 108 mmol/L (ref 98–111)
Creatinine, Ser: 0.73 mg/dL (ref 0.44–1.00)
GFR, Estimated: >60 mL/min (ref >60)
Glucose, Bld: 114 mg/dL — ABNORMAL HIGH (ref 70–99)
Potassium: 3.9 mmol/L (ref 3.5–5.1)
Sodium: 138 mmol/L (ref 135–145)
Total Bilirubin: 0.2 mg/dL — ABNORMAL LOW (ref 0.3–1.2)
Total Protein: 7.1 g/dL (ref 6.5–8.1)

## 2020-04-04 LAB — URINALYSIS, ROUTINE W REFLEX MICROSCOPIC
Bilirubin Urine: NEGATIVE
Glucose, UA: NEGATIVE mg/dL
Hgb urine dipstick: NEGATIVE
Ketones, ur: NEGATIVE mg/dL
Leukocytes,Ua: NEGATIVE
Nitrite: NEGATIVE
Protein, ur: NEGATIVE mg/dL
Specific Gravity, Urine: 1.017 (ref 1.005–1.030)
pH: 5 (ref 5.0–8.0)

## 2020-04-04 LAB — CBC
HCT: 35.6 % — ABNORMAL LOW (ref 36.0–46.0)
Hemoglobin: 11.2 g/dL — ABNORMAL LOW (ref 12.0–15.0)
MCH: 22.6 pg — ABNORMAL LOW (ref 26.0–34.0)
MCHC: 31.5 g/dL (ref 30.0–36.0)
MCV: 71.9 fL — ABNORMAL LOW (ref 80.0–100.0)
Platelets: 465 10*3/uL — ABNORMAL HIGH (ref 150–400)
RBC: 4.95 MIL/uL (ref 3.87–5.11)
RDW: 16.6 % — ABNORMAL HIGH (ref 11.5–15.5)
WBC: 10.4 10*3/uL (ref 4.0–10.5)
nRBC: 0 % (ref 0.0–0.2)

## 2020-04-04 LAB — I-STAT BETA HCG BLOOD, ED (MC, WL, AP ONLY): I-stat hCG, quantitative: <5 m[IU]/mL (ref <5)

## 2020-04-04 LAB — HIV ANTIBODY (ROUTINE TESTING W REFLEX): HIV Screen 4th Generation wRfx: NONREACTIVE

## 2020-04-04 LAB — TSH: TSH: 1.235 u[IU]/mL (ref 0.350–4.500)

## 2020-04-04 LAB — HEPARIN LEVEL (UNFRACTIONATED): Heparin Unfractionated: 0.32 IU/mL (ref 0.30–0.70)

## 2020-04-04 LAB — LIPASE, BLOOD: Lipase: 39 U/L (ref 11–51)

## 2020-04-04 LAB — APTT: aPTT: 30 seconds (ref 24–36)

## 2020-04-04 MED ORDER — HYDROCORTISONE NA SUCCINATE PF 250 MG IJ SOLR
200.0000 mg | Freq: Once | INTRAMUSCULAR | Status: AC
Start: 2020-04-04 — End: 2020-04-04
  Administered 2020-04-04: 200 mg via INTRAVENOUS
  Filled 2020-04-04: qty 200

## 2020-04-04 MED ORDER — APIXABAN 5 MG PO TABS
5.0000 mg | ORAL_TABLET | Freq: Two times a day (BID) | ORAL | Status: DC
  Filled 2020-04-04: qty 1

## 2020-04-04 MED ORDER — SODIUM CHLORIDE 0.9 % IV SOLN: INTRAVENOUS | Status: AC

## 2020-04-04 MED ORDER — SERTRALINE HCL 100 MG PO TABS
200.0000 mg | ORAL_TABLET | Freq: Every day | ORAL | Status: DC
  Administered 2020-04-04 – 2020-04-05 (×2): 200 mg via ORAL
  Filled 2020-04-04 (×2): qty 2

## 2020-04-04 MED ORDER — ARFORMOTEROL TARTRATE 15 MCG/2ML IN NEBU
15.0000 ug | INHALATION_SOLUTION | Freq: Two times a day (BID) | RESPIRATORY_TRACT | Status: DC
  Administered 2020-04-04: 15 ug via RESPIRATORY_TRACT
  Filled 2020-04-04 (×5): qty 2

## 2020-04-04 MED ORDER — DIPHENHYDRAMINE HCL 25 MG PO CAPS: 50.0000 mg | ORAL_CAPSULE | Freq: Once | ORAL | Status: AC

## 2020-04-04 MED ORDER — MORPHINE SULFATE (PF) 4 MG/ML IV SOLN
4.0000 mg | Freq: Once | INTRAVENOUS | Status: AC
  Administered 2020-04-04: 4 mg via INTRAVENOUS
  Filled 2020-04-04: qty 1

## 2020-04-04 MED ORDER — ONDANSETRON HCL 4 MG/2ML IJ SOLN
4.0000 mg | Freq: Once | INTRAMUSCULAR | Status: AC
  Administered 2020-04-04: 4 mg via INTRAVENOUS
  Filled 2020-04-04: qty 2

## 2020-04-04 MED ORDER — SUCRALFATE 1 GM/10ML PO SUSP
1.0000 g | Freq: Two times a day (BID) | ORAL | Status: DC
  Administered 2020-04-04 – 2020-04-06 (×4): 1 g via ORAL
  Filled 2020-04-04 (×4): qty 10

## 2020-04-04 MED ORDER — ALUM & MAG HYDROXIDE-SIMETH 200-200-20 MG/5ML PO SUSP
30.0000 mL | Freq: Once | ORAL | Status: AC
  Administered 2020-04-04: 30 mL via ORAL
  Filled 2020-04-04: qty 30

## 2020-04-04 MED ORDER — HEPARIN (PORCINE) 25000 UT/250ML-% IV SOLN
1500.0000 [IU]/h | INTRAVENOUS | Status: DC
  Administered 2020-04-04 (×2): 1200 [IU]/h via INTRAVENOUS
  Administered 2020-04-05: 1500 [IU]/h via INTRAVENOUS
  Filled 2020-04-04 (×3): qty 250

## 2020-04-04 MED ORDER — MORPHINE SULFATE (PF) 2 MG/ML IV SOLN
2.0000 mg | INTRAVENOUS | Status: DC | PRN
  Administered 2020-04-04 – 2020-04-05 (×5): 2 mg via INTRAVENOUS
  Filled 2020-04-04 (×6): qty 1

## 2020-04-04 MED ORDER — PANTOPRAZOLE SODIUM 40 MG PO TBEC
40.0000 mg | DELAYED_RELEASE_TABLET | Freq: Every day | ORAL | Status: DC
  Administered 2020-04-05: 40 mg via ORAL
  Filled 2020-04-04 (×2): qty 1

## 2020-04-04 MED ORDER — ALPRAZOLAM 0.25 MG PO TABS
0.2500 mg | ORAL_TABLET | Freq: Every day | ORAL | Status: DC | PRN
  Administered 2020-04-04: 0.5 mg via ORAL
  Filled 2020-04-04: qty 2

## 2020-04-04 MED ORDER — SODIUM CHLORIDE 0.9 % IV BOLUS
1000.0000 mL | Freq: Once | INTRAVENOUS | Status: AC
  Administered 2020-04-04: 1000 mL via INTRAVENOUS

## 2020-04-04 MED ORDER — MORPHINE SULFATE (PF) 4 MG/ML IV SOLN
4.0000 mg | Freq: Once | INTRAVENOUS | Status: AC
Start: 2020-04-04 — End: 2020-04-04
  Administered 2020-04-04: 4 mg via INTRAVENOUS
  Filled 2020-04-04: qty 1

## 2020-04-04 MED ORDER — FAMOTIDINE IN NACL 20-0.9 MG/50ML-% IV SOLN
20.0000 mg | Freq: Every day | INTRAVENOUS | Status: DC
  Filled 2020-04-04: qty 50

## 2020-04-04 MED ORDER — POLYETHYLENE GLYCOL 3350 17 G PO PACK: 17.0000 g | PACK | Freq: Every day | ORAL | Status: DC | PRN

## 2020-04-04 MED ORDER — ZOLPIDEM TARTRATE 5 MG PO TABS
5.0000 mg | ORAL_TABLET | Freq: Every evening | ORAL | Status: AC | PRN
Start: 2020-04-04 — End: 2020-04-05
  Administered 2020-04-04 – 2020-04-05 (×2): 5 mg via ORAL
  Filled 2020-04-04 (×2): qty 1

## 2020-04-04 MED ORDER — IOHEXOL 350 MG/ML SOLN
100.0000 mL | Freq: Once | INTRAVENOUS | Status: AC | PRN
  Administered 2020-04-04: 100 mL via INTRAVENOUS

## 2020-04-04 MED ORDER — REVEFENACIN 175 MCG/3ML IN SOLN
175.0000 ug | Freq: Every day | RESPIRATORY_TRACT | Status: DC
  Administered 2020-04-04: 175 ug via RESPIRATORY_TRACT
  Filled 2020-04-04 (×3): qty 3

## 2020-04-04 MED ORDER — DIPHENHYDRAMINE HCL 50 MG/ML IJ SOLN
50.0000 mg | Freq: Once | INTRAMUSCULAR | Status: AC
  Administered 2020-04-04: 50 mg via INTRAVENOUS
  Filled 2020-04-04: qty 1

## 2020-04-04 MED ORDER — HYDROXYCHLOROQUINE SULFATE 200 MG PO TABS
400.0000 mg | ORAL_TABLET | Freq: Every day | ORAL | Status: DC
  Administered 2020-04-04 – 2020-04-06 (×3): 400 mg via ORAL
  Filled 2020-04-04 (×4): qty 2

## 2020-04-04 MED ORDER — SODIUM CHLORIDE 0.9% FLUSH
3.0000 mL | Freq: Two times a day (BID) | INTRAVENOUS | Status: DC
  Administered 2020-04-04 – 2020-04-06 (×5): 3 mL via INTRAVENOUS

## 2020-04-04 MED ORDER — BUDESONIDE 0.5 MG/2ML IN SUSP
0.5000 mg | Freq: Two times a day (BID) | RESPIRATORY_TRACT | Status: DC
  Administered 2020-04-04: 0.5 mg via RESPIRATORY_TRACT
  Filled 2020-04-04 (×4): qty 2

## 2020-04-04 MED ORDER — ALBUTEROL SULFATE HFA 108 (90 BASE) MCG/ACT IN AERS: 2.0000 | INHALATION_SPRAY | RESPIRATORY_TRACT | Status: DC | PRN

## 2020-04-04 NOTE — ED Notes (Signed)
Ambulatory to bathroom with steady gait. Denies any increased SOB, dizziness or any issues while walking. Back to bed without issue.

## 2020-04-04 NOTE — Consult Note (Addendum)
[facility] Consult: 2:25 PM 04/04/2020  LOS: 0 days    Referring Provider: Dr Marva Panda  Primary Care Physician:  [facility] Primary Gastroenterologist: Dr. Koleen Distance at [place].  Previously seen in [place] by Dr. Juel Burrow at gastroenterology of the [facility].  Also seen by GI doctors in [place].    Reason for Consultation: Nausea, vomiting, abdominal pain, streaky hemoptysis, fever.   HPI: [name] is a 25 y.o. female.  PMH celiac disease.  Lupus.  On Plaquenil.  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  Endometriosis.  Asthma.  Obesity.  S/p appendectomy, excision of endometrioma. S/p cholecystectomy.  Anxiety. ?  Possible ulcerative colitis?  Ovarian cyst removal summer 2021.  Acute Hep A in 2015.  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  Bone marrow bx 08/2013: Normochromic, mildly microcytic RBCs.  Adequate WBCs, platelets.  -19 + pneumonia 03/18/2020.  Pulmonary, Dr. Chase Caller and rheumatology consulted on patient.  She was discharged on doxycycline.  Long history of abdominal pain dating back to 2014.  Multiple CTs   08/2012 EGD.  At [facility] in [place].  Grossly normal esophagus, stomach, examined duodenum. Duodenum biopsied, path: normal duodenal mucosa. 2019 EGD revealed hiatal hernia, esophagitis. 12/05/2018 colonoscopy.  For diarrhea.  Dr. Juel Burrow in [place] random biopsies negative for microscopic colitis. (Unable to access procedure or pathology reports.) 11/2019 colonoscopy.  Dr. Alan Ripper at [facility].  No colitis.  Apparently unremarkable study but unable to access report 11/2019 EGD by Dr. Alan Ripper.  Was not advised there is any significant pathology.  Again unable to access reports 01/02/2020 CTAP without contrast/stone protocol.  Showed stable small subcentimeter  hypoattenuating lesions in the hepatic dome, too small to characterize.  Pancreas, spleen, biliary tree, small bowel, colon, stomach unremarkable 02/07/2020 CTAP with contrast at the [facility] in [place].  For evaluation of abdominal, flank pain.  9 mm cystic lesion in the liver.  Pancreas, spleen, stomach, SPN: Unremarkable.  Right main pulmonary artery filling defect consistent with PE. Regarding celiac testing:  11/2018 TTG IgA was <2.  IgA was normal at 136.  TTG IgG and IgA Abs < 3 in 08/2012 Endomysial Abs negative in 08/2038.  08/2011 celiac disease report at the [facility]: DQ2: (DQB1*02) is Negative.  DQB1*02:01 or 02:02 is not Present.  DQA1*05:01 or 05:05 is not Present.  DQ8: (DQB1*03:02) is Negative.  DQB1*03:02, DQA1*03 are Not Present.     Pt's had issues with intermittent nausea, vomiting, diarrhea leading to previous studies including EGD, colonoscopies and CT scans.  At age 28 she was diagnosed with celiac disease and says she is compliant with avoiding gluten and that she "knows it" if she inadvertently consumes gluten. After the EGD in November 2021, she was advised to continue using Prn Phenergan which she needs to use fairly frequently for intermittent nausea and vomiting.  She also was advised to continue on an "anti-ulcer" medication, Carafate, Colestid and dicyclomine. She stopped taking the above medications because they were not helping the diarrhea which is intermittent and not currently an  issue.  She feels that what ever acid suppressing medication she was taking caused abdominal pain.  Suffered no ill effects that she is aware of from the Carafate but she stopped taking this. Patient was vaccinated once in December 2020 for COVID-19.  She was admitted for 10 days to the hospital with vaccine reaction and never had any additional COVID-19 vaccines.  Tested positive for COVID-19 around 03/16/2020 by nasal swab testing at [facility].   Seen on 03/18/2020 and 03/19/2020 in the emergency department with 2 days of cough, dyspnea, mild hemoptysis, malaise, body aches..  CT of chest 03/18/2020 showed bilateral patchy opacities.  She was started on a course of doxycycline. She followed up with a pulmonary MD on 3/1.  Dr. Chase Caller raise concern for active lupus given the recurrent PE and recent Covid infection.  Initially he did not treat her with steroids but the following day, he called in a 8-day prescription of prednisone.  Saw a rheumatology on 3/2 but systemic markers for lupus disease were negative.  Elevated complement most consistent with response to infection rather than lupus flare.  Dr. Benjamine Mola wanted to see her back once she had finished antibiotics and prednisone.  Patients continue to have malaise, body aches, cough that has not improved with meds.  Developed a acute flare of her nausea and vomiting 2 days prior to admission.  Vomiting 5 or 6 times daily, primarily partially digested food, then bile and saw scant streaks of blood in the emesis on a few occasions.  Cough tends to trigger the vomiting but she also vomits spontaneously.  No current problems with diarrhea.  Abdomen feels sore but no significant visceral abdominal pain. CTAP with contrast 04/04/2020:  No PE, clear lungs.  No bowel wall thickening or obstruction.  No abscess.  Appendix and gallbladder are absent.  No findings to explain patient's symptoms.  Social history Patient is a Art gallery manager at [employer].  Her family lives in [place]. No drugs or alcohol. Family history Cousins who have "colitis".  Past Medical History:  Diagnosis Date  . Asthma   . Celiac disease   . Collagen vascular disease ([place])   . Endometriosis   . Lupus ([place])   . Ovarian cyst   . Pulmonary embolus ([place]) 02/06/2020  . Ulcerative colitis [facility])     Past Surgical History:  Procedure Laterality Date  . APPENDECTOMY    . CHOLECYSTECTOMY    . EXCISION OF ENDOMETRIOMA     Ovarian cyst  removal  . FOOT FRACTURE SURGERY     w/ hardware  . HERNIA REPAIR    . TONSILLECTOMY      Prior to Admission medications   Medication Sig Start Date End Date Taking? Authorizing Provider  albuterol (PROVENTIL) (2.5 MG/3ML) 0.083% nebulizer solution Take 3 mLs (2.5 mg total) by nebulization every 6 (six) hours as needed for wheezing or shortness of breath. 03/19/20  Yes Volney American, PA-C  albuterol (VENTOLIN HFA) 108 (90 Base) MCG/ACT inhaler Inhale 2 puffs into the lungs every 6 (six) hours as needed for wheezing or shortness of breath.   Yes [provider]  ALPRAZolam Duanne Moron) 0.5 MG tablet Take 0.25-0.5 mg by mouth daily as needed for anxiety.   Yes [provider]  apixaban (ELIQUIS) 5 MG TABS tablet Take 5 mg by mouth 2 (two) times daily.   Yes [provider]  hydroxychloroquine (PLAQUENIL) 200 MG tablet Take 2 tablets (400 mg total) by mouth daily. 03/23/20  Yes Rice, Resa Miner, MD  Norethindrone-Ethinyl Estradiol-Fe Biphas (LO LOESTRIN FE) 1 MG-10 MCG / 10 MCG tablet Take 1 tablet by mouth daily.   Yes [provider]  promethazine (PHENERGAN) 12.5 MG tablet Take 1 tablet (12.5 mg total) by mouth every 6 (six) hours as needed for nausea or vomiting. 03/23/20  Yes Rice, Resa Miner, MD  sertraline (ZOLOFT) 100 MG tablet Take 200 mg by mouth at bedtime.   Yes [provider]  zolpidem (AMBIEN) 10 MG tablet Take 20 mg by mouth at bedtime as needed for sleep.    Yes [provider]  chlorpheniramine-HYDROcodone (TUSSIONEX PENNKINETIC ER) 10-8 MG/5ML SUER Take 5 mLs by mouth every 12 (twelve) hours as needed for cough. Patient not taking: Reported on 04/04/2020 03/23/20   Collier Salina, MD  doxycycline (VIBRAMYCIN) 100 MG capsule Take 1 capsule (100 mg total) by mouth 2 (two) times daily. Patient not taking: Reported on 04/04/2020 03/18/20   Carmin Muskrat, MD  predniSONE (DELTASONE) 10 MG tablet Take 40 mg x 1 day, 30 mg x  1 day, 20 mg x 1 day, 10 mg x 1 day 5 mg x 1 day then stop Patient not taking: Reported on 04/04/2020 03/24/20   Brand Males, MD  dicyclomine (BENTYL) 20 MG tablet Take 1 tablet (20 mg total) by mouth 2 (two) times daily. 11/29/18 04/11/19  Muthersbaugh, Jarrett Soho, PA-C  Fluticasone Propionate HFA (FLOVENT HFA IN) Inhale into the lungs. Patient not taking: Reported on 11/14/2019  11/14/19  [provider]    Scheduled Meds: . arformoterol  15 mcg Nebulization BID  . budesonide (PULMICORT) nebulizer solution  0.5 mg Nebulization BID  . hydroxychloroquine  400 mg Oral Daily  . revefenacin  175 mcg Nebulization Daily  . sertraline  200 mg Oral QHS  . sodium chloride flush  3 mL Intravenous Q12H   Infusions: . sodium chloride    . heparin     PRN Meds: albuterol, ALPRAZolam, morphine injection, polyethylene glycol   Allergies as of 04/04/2020 - Review Complete 04/04/2020  Allergen Reaction Noted  . Azithromycin Anaphylaxis 11/28/2018  . Peanut-containing drug products Other (See Comments) 11/28/2018  . Iodinated diagnostic agents Hives and Itching 11/13/2019  . Other Other (See Comments) 10/22/2019  . Amoxicillin Swelling 11/28/2018  . Fish allergy Other (See Comments) 11/28/2018  . Gluten meal Other (See Comments) 12/30/2019  . Hydromet [hydrocodone-homatropine] Nausea And Vomiting 04/04/2020  . Ibuprofen Nausea And Vomiting 09/06/2011  . Pantoprazole Other (See Comments) 11/28/2018    Family History  Problem Relation Age of Onset  . Hypertension Mother   . Hypercholesterolemia Mother   . Rheum arthritis Mother   . Diabetes Father   . Hypertension Father   . Cancer Father   . Autism Brother     Social History   Socioeconomic History  . Marital status: Single    Spouse name: Not on file  . Number of children: Not on file  . Years of education: Not on file  . Highest education level: Not on file  Occupational History  . Not on file  Tobacco Use  . Smoking  status: Never Smoker  . Smokeless tobacco: Never Used  Vaping Use  . Vaping Use: Never used  Substance and Sexual Activity  . Alcohol use: Never  . Drug use: Never  . Sexual activity: Not on file  Other Topics Concern  . Not on file  Social History Narrative  . Not on file   Social Determinants  of Health   Financial Resource Strain: Not on file  Food Insecurity: Not on file  Transportation Needs: Not on file  Physical Activity: Not on file  Stress: Not on file  Social Connections: Not on file  Intimate Partner Violence: Not on file    REVIEW OF SYSTEMS: Constitutional: Lays, fatigue ENT:  No nose bleeds Pulm: Productive cough. CV:  No palpitations, no LE edema.  GU:  No hematuria, no frequency GI: See HPI Heme: Denies excessive or unusual bleeding or bruising other than scant hemoptysis and scant hematemesis Transfusions: None Neuro:  No headaches, no peripheral tingling or numbness.  No syncope, no seizure Derm:  No itching, no rash or sores.  Endocrine:  No sweats or chills.  No polyuria or dysuria Immunization: See HPI. In the last several months she has traveled between [place] and [place]: Vital signs in last 24 hours: Vitals:   04/04/20 1211 04/04/20 1340  BP: (!) 136/94 (!) 150/97  Pulse: (!) 122 (!) 111  Resp: 16 18  Temp: 98.2 F (36.8 C) 97.7 F (36.5 C)  SpO2: 97% 98%   Wt Readings from Last 3 Encounters:  04/04/20 99.8 kg  03/23/20 116.9 kg  03/22/20 94.7 kg    General: Obese, nonacutely ill-appearing.  Laying comfortably on the bed. Head: No facial asymmetry or swelling.  No signs of head trauma. Eyes: No conjunctival pallor or scleral icterus Ears: No hearing deficit Nose: No congestion or discharge Mouth: Oropharynx moist, pink, clear.  Tongue midline. Neck: No JVD, no masses, no thyromegaly Lungs: Diminished breath sounds but clear.  No cough or labored breathing Heart: RRR.  S1, S2 audible.  No MRG Abdomen: Soft.   Slight diffuse tenderness without guarding or rebound.  Active bowel sounds.  No HSM, masses, bruits, hernia.   Rectal: Deferred Musc/Skeltl: No joint redness, swelling or gross deformity Extremities: No CCE. Neurologic: Alert.  Oriented x3.  Moves all 4 limbs without weakness.  No tremors Skin: No sores, rashes or erythema Nodes: No cervical adenopathy Psych: Calm, cooperative, pleasant affect.  Intake/Output from previous day: No intake/output data recorded. Intake/Output this shift: Total I/O In: 1000 [IV Piggyback:1000] Out: -   LAB RESULTS: Recent Labs    04/04/20 0500  WBC 10.4  HGB 11.2*  HCT 35.6*  PLT 465*   BMET Lab Results  Component Value Date   NA 138 04/04/2020   NA 140 03/18/2020   NA 140 12/31/2019   K 3.9 04/04/2020   K 3.4 (L) 03/18/2020   K 3.5 12/31/2019   CL 108 04/04/2020   CL 110 03/18/2020   CL 109 12/31/2019   CO2 21 (L) 04/04/2020   CO2 21 (L) 03/18/2020   CO2 18 (L) 12/31/2019   GLUCOSE 114 (H) 04/04/2020   GLUCOSE 102 (H) 03/18/2020   GLUCOSE 165 (H) 12/31/2019   BUN 11 04/04/2020   BUN 8 03/18/2020   BUN 10 12/31/2019   CREATININE 0.73 04/04/2020   CREATININE 0.80 03/18/2020   CREATININE 0.93 12/31/2019   CALCIUM 9.4 04/04/2020   CALCIUM 8.9 03/18/2020   CALCIUM 9.4 12/31/2019   LFT Recent Labs    04/04/20 0500  PROT 7.1  ALBUMIN 3.6  AST 19  ALT 13  ALKPHOS 65  BILITOT 0.2*   PT/INR No results found for: INR, PROTIME Hepatitis Panel No results for input(s): HEPBSAG, HCVAB, HEPAIGM, HEPBIGM in the last 72 hours. C-Diff No components found for: CDIFF Lipase     Component  Value Date/Time   LIPASE 39 04/04/2020 0500    Drugs of Abuse  No results found for: LABOPIA, COCAINSCRNUR, LABBENZ, AMPHETMU, THCU, LABBARB   RADIOLOGY STUDIES: DG Chest 2 View  Result Date: 04/04/2020 CLINICAL DATA:  Shortness of breath.  COVID induced pneumonia. EXAM: CHEST - 2 VIEW COMPARISON:  Chest x-ray 03/01/2020, CT chest  03/18/2020 FINDINGS: The heart size and mediastinal contours are within normal limits. No focal consolidation. No pulmonary edema. No pleural effusion. No pneumothorax. No acute osseous abnormality. IMPRESSION: No active cardiopulmonary disease. Electronically Signed   By: Iven Finn M.D.   On: 04/04/2020 05:23   CT Angio Chest PE W and/or Wo Contrast  Result Date: 04/04/2020 CLINICAL DATA:  Chest pain.  Abdomen pain. EXAM: CT ANGIOGRAPHY CHEST CT ABDOMEN AND PELVIS WITH CONTRAST TECHNIQUE: Multidetector CT imaging of the chest was performed using the standard protocol during bolus administration of intravenous contrast. Multiplanar CT image reconstructions and MIPs were obtained to evaluate the vascular anatomy. Multidetector CT imaging of the abdomen and pelvis was performed using the standard protocol during bolus administration of intravenous contrast. CONTRAST:  141m OMNIPAQUE IOHEXOL 350 MG/ML SOLN COMPARISON:  None. CT abdomen and pelvis November 13, 2018 1; chest CT March 18, 2020 FINDINGS: CTA CHEST FINDINGS Cardiovascular: There is no evident pulmonary embolus. No thoracic aortic aneurysm or dissection. Visualized great vessels appear normal. No thoracic aortic aneurysm or dissection. Mediastinum/Nodes: Thyroid appears normal. Small amount of thymic tissue is normal for age. No evident thoracic adenopathy. No esophageal lesions evident. Lungs/Pleura: Interval clearing of infiltrate compared to prior CT. Lungs now clear. No pleural effusion. No pneumothorax. Trachea and major bronchial structures appear patent. Musculoskeletal: No blastic or lytic bone lesions. Review of the MIP images confirms the above findings. CT ABDOMEN and PELVIS FINDINGS Hepatobiliary: There is a 5 mm cyst in the anterior segment right lobe of the liver. No other focal liver lesions are evident. Gallbladder is absent. There is no appreciable biliary dilatation. Pancreas: There is no pancreatic mass or inflammatory focus.  Spleen: No splenic lesions are evident. Adrenals/Urinary Tract: Adrenals bilaterally appear normal. There is no appreciable renal mass or hydronephrosis on either side. There is no evident renal or ureteral calculus on either side. Urinary bladder is midline with wall thickness within normal limits. Stomach/Bowel: There is no appreciable bowel wall or mesenteric thickening. Moderate stool noted in the colon. No bowel obstruction. Terminal ileum appears normal. No free air or portal venous air. Appendix absent. No periappendiceal region inflammation. Vascular/Lymphatic: No abdominal aortic aneurysm. No arterial vascular lesions are evident. Major venous structures appear patent. There is no evident adenopathy in the abdomen or pelvis. Reproductive: The uterus is anteverted.  No evident adnexal mass. Other: No abscess or ascites in the abdomen or pelvis. Musculoskeletal: No blastic or lytic bone lesions. No abdominal wall or intramuscular lesions. Review of the MIP images confirms the above findings. IMPRESSION: CT angiogram chest: 1. No demonstrable pulmonary embolus. No thoracic aortic aneurysm or dissection. 2.  Lungs now clear. 3.  No adenopathy. CT abdomen and pelvis: 1. A cause for patient's symptoms has not been established with this study. 2. No bowel wall thickening or bowel obstruction. No abscess in the abdomen or pelvis. Appendix absent. 3.  Gallbladder absent. 4. No evident renal or ureteral calculus. No hydronephrosis. Urinary bladder wall thickness normal. Electronically Signed   By: WLowella GripIII M.D.   On: 04/04/2020 10:17   CT ABDOMEN PELVIS W CONTRAST  Result Date: 04/04/2020  CLINICAL DATA:  Chest pain.  Abdomen pain. EXAM: CT ANGIOGRAPHY CHEST CT ABDOMEN AND PELVIS WITH CONTRAST TECHNIQUE: Multidetector CT imaging of the chest was performed using the standard protocol during bolus administration of intravenous contrast. Multiplanar CT image reconstructions and MIPs were obtained to  evaluate the vascular anatomy. Multidetector CT imaging of the abdomen and pelvis was performed using the standard protocol during bolus administration of intravenous contrast. CONTRAST:  166m OMNIPAQUE IOHEXOL 350 MG/ML SOLN COMPARISON:  None. CT abdomen and pelvis November 13, 2018 1; chest CT March 18, 2020 FINDINGS: CTA CHEST FINDINGS Cardiovascular: There is no evident pulmonary embolus. No thoracic aortic aneurysm or dissection. Visualized great vessels appear normal. No thoracic aortic aneurysm or dissection. Mediastinum/Nodes: Thyroid appears normal. Small amount of thymic tissue is normal for age. No evident thoracic adenopathy. No esophageal lesions evident. Lungs/Pleura: Interval clearing of infiltrate compared to prior CT. Lungs now clear. No pleural effusion. No pneumothorax. Trachea and major bronchial structures appear patent. Musculoskeletal: No blastic or lytic bone lesions. Review of the MIP images confirms the above findings. CT ABDOMEN and PELVIS FINDINGS Hepatobiliary: There is a 5 mm cyst in the anterior segment right lobe of the liver. No other focal liver lesions are evident. Gallbladder is absent. There is no appreciable biliary dilatation. Pancreas: There is no pancreatic mass or inflammatory focus. Spleen: No splenic lesions are evident. Adrenals/Urinary Tract: Adrenals bilaterally appear normal. There is no appreciable renal mass or hydronephrosis on either side. There is no evident renal or ureteral calculus on either side. Urinary bladder is midline with wall thickness within normal limits. Stomach/Bowel: There is no appreciable bowel wall or mesenteric thickening. Moderate stool noted in the colon. No bowel obstruction. Terminal ileum appears normal. No free air or portal venous air. Appendix absent. No periappendiceal region inflammation. Vascular/Lymphatic: No abdominal aortic aneurysm. No arterial vascular lesions are evident. Major venous structures appear patent. There is no  evident adenopathy in the abdomen or pelvis. Reproductive: The uterus is anteverted.  No evident adnexal mass. Other: No abscess or ascites in the abdomen or pelvis. Musculoskeletal: No blastic or lytic bone lesions. No abdominal wall or intramuscular lesions. Review of the MIP images confirms the above findings. IMPRESSION: CT angiogram chest: 1. No demonstrable pulmonary embolus. No thoracic aortic aneurysm or dissection. 2.  Lungs now clear. 3.  No adenopathy. CT abdomen and pelvis: 1. A cause for patient's symptoms has not been established with this study. 2. No bowel wall thickening or bowel obstruction. No abscess in the abdomen or pelvis. Appendix absent. 3.  Gallbladder absent. 4. No evident renal or ureteral calculus. No hydronephrosis. Urinary bladder wall thickness normal. Electronically Signed   By: WLowella GripIII M.D.   On: 04/04/2020 10:17     IMPRESSION:   *   Acute episode of nausea, vomiting with scant hematemesis.  Has issues with chronic intermittent nausea and vomiting worked up in the past.  Some years ago she had a esophagitis but at recent EGD in fall 2021 she was not advised there was esophagitis or ulcers.  However we do not have access to Dr. SHarrington Challengerreports.  Pt not taking any of the recommended GI meds.  *     [facility]  Persistent nonpurulent cough, malaise, body aches since that time despit completing a course of doxycycline and brief course of prednisone starting 03/24/2020.  2 view CXR of 3/14 shows no pneumonia or pathology.  *  Chronic Eliquis for history recurrent PE/DVT.  Currently on IV Heparin.    *     Pt reported history of celiac disease and reported compliance with gluten-free diet.  *   Lupus.  On chronic Plaquenil.      PLAN:     *   Start Protonix 40 mg once daily, Carafate slurry twice daily for now though could up this to 4 times daily.  I realize that patient believes  Protonix caused stomach pain in the past but going to give it a try as there was no anaphylactic type response to this medication.  *    Okay to resume diet as tolerated, start with clear liquids.  Stick to gluten-free parameters.  *   Dr. Tarri Glenn will see the patient but at present no plans for scoping the patient.  As an outpatient pt should arrange for office follow-up soon with Dr. Alan Ripper.     Azucena Freed  04/04/2020, 2:25 PM Phone [phone]

## 2020-04-04 NOTE — ED Triage Notes (Signed)
Patient diagnosed with Pneumonia 03/18/20. Patient has been getting worse. Patient has seen pulmonology and rheumatology. She states that she is having a dry cough with some blood, chest pain, vomiting, abdominal pain, General Body Aches, sob, fatigue, and neck stiffness.

## 2020-04-04 NOTE — ED Provider Notes (Signed)
[facility] DEPT Provider Note   CSN: [id number] Arrival date & time: 04/04/20  0348     History Chief Complaint  Patient presents with  . Shortness of Breath    [name] is a 25 y.o. female with a complicated medical history including celiac's disease, lupus, ulcerative colitis and pulmonary embolus presents to the Emergency Department complaining of gradual, persistent, progressively worsening cough with shortness of breath onset several days ago. Associated symptoms include severe 10/10 abdominal pain around the umbilicus with associated nausea and vomiting.  Patient also complains of intermittent hemoptysis, fever to 104, inability to hold down any foods or liquids, myalgias.  Patient reports taking Zofran without relief.  She has been treating fevers with Tylenol. Pt does report chest pain and palpitations. Denies leg swelling.  Denies missed doses of   Patient has a history of PE x3 and DVT x2 diagnosed in January 2022 after bladder surgery to remove a polyp at [facility].  She was placed on the Eliquis starter pack 2 times per day and has remained on twice a day dosing since that time.  She does currently take an oral contraceptive.  She denies smoking.  Patient was subsequently diagnosed with COVID-19 on February 24, 2020.  After worsening symptoms she was seen in the emergency department on 03/18/2020 and diagnosed with COVID-19 pneumonia.  This was treated with doxycycline and patient reports she was significantly improving until several days ago when she began to have worsening shortness of breath and cough.  She reports 3 days ago her fever returned and she began vomiting 2 days ago.  The history is provided by the patient and medical records. No language interpreter was used.       Past Medical History:  Diagnosis Date  . Asthma   . Celiac disease   . Endometriosis   . Lupus ([place])   . Ovarian cyst   . Pulmonary embolus ([place]) 02/06/2020   . Ulcerative colitis [facility])     Patient Active Problem List   Diagnosis Date Noted  . Pneumonia 03/23/2020  . Chronic nausea 03/23/2020  . Asthma 03/22/2020  . Dysuria 02/03/2020  . Hematuria 02/03/2020  . Mass of urinary bladder 01/26/2020  . Allergic rhinitis 05/05/2019  . 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 Endometriosis 03/14/2018  . Long-term use of high-risk medication 07/02/2017  . Lupus anticoagulant positive 07/02/2017  . Personal history of pulmonary embolism 07/02/2017  . Other forms of systemic lupus erythematosus ([place]) 03/12/2017  . Anemia 02/28/2017  . Migraine 02/28/2017  . Dysmenorrhea 08/28/2011  . Gastritis 08/28/2011    Past Surgical History:  Procedure Laterality Date  . APPENDECTOMY    . CHOLECYSTECTOMY    . EXCISION OF ENDOMETRIOMA     Ovarian cyst removal  . FOOT FRACTURE SURGERY     w/ hardware  . HERNIA REPAIR    . TONSILLECTOMY       OB History   No obstetric history on file.     Family History  Problem Relation Age of Onset  . Hypertension Mother   . Hypercholesterolemia Mother   . Rheum arthritis Mother   . Diabetes Father   . Hypertension Father   . Cancer Father   . Autism Brother     Social History   Tobacco Use  . Smoking status: Never Smoker  . Smokeless tobacco: Never Used  Vaping Use  . Vaping Use: Never used  Substance Use Topics  . Alcohol use: Never  .  Drug use: Never    Home Medications Prior to Admission medications   Medication Sig Start Date End Date Taking? Authorizing Provider  albuterol (PROVENTIL) (2.5 MG/3ML) 0.083% nebulizer solution Take 3 mLs (2.5 mg total) by nebulization every 6 (six) hours as needed for wheezing or shortness of breath. 03/19/20   Volney American, PA-C  albuterol (VENTOLIN HFA) 108 (90 Base) MCG/ACT inhaler Inhale 2 puffs into the lungs every 6 (six) hours as needed for wheezing or shortness of breath.    [provider]  ALPRAZolam Duanne Moron) 0.5 MG tablet Take  0.25-0.5 mg by mouth daily as needed for anxiety.    [provider]  apixaban (ELIQUIS) 5 MG TABS tablet Eliquis DVT-PE Treatment 30-Day Starter 5 mg (74 tablets) in dose pack  TAKE 2 TABLETS BY MOUTH TWICE DAILY FOR 7 DAYS THEN TAKE 1 TABLET BY MOUTH TWICE DAILY THEREAFTER    [provider]  chlorpheniramine-HYDROcodone (TUSSIONEX PENNKINETIC ER) 10-8 MG/5ML SUER Take 5 mLs by mouth every 12 (twelve) hours as needed for cough. 03/23/20   Rice, Resa Miner, MD  doxycycline (VIBRAMYCIN) 100 MG capsule Take 1 capsule (100 mg total) by mouth 2 (two) times daily. 03/18/20   Carmin Muskrat, MD  hydroxychloroquine (PLAQUENIL) 200 MG tablet Take 2 tablets (400 mg total) by mouth daily. 03/23/20   Rice, Resa Miner, MD  Norethindrone-Ethinyl Estradiol-Fe Biphas (LO LOESTRIN FE) 1 MG-10 MCG / 10 MCG tablet Take 1 tablet by mouth daily.    [provider]  predniSONE (DELTASONE) 10 MG tablet Take 40 mg x 1 day, 30 mg x 1 day, 20 mg x 1 day, 10 mg x 1 day 5 mg x 1 day then stop 03/24/20   Brand Males, MD  promethazine (PHENERGAN) 12.5 MG tablet Take 1 tablet (12.5 mg total) by mouth every 6 (six) hours as needed for nausea or vomiting. 03/23/20   Collier Salina, MD  sertraline (ZOLOFT) 100 MG tablet Take 200 mg by mouth at bedtime.    [provider]  zolpidem (AMBIEN) 10 MG tablet Take 20 mg by mouth at bedtime as needed for sleep.     [provider]  dicyclomine (BENTYL) 20 MG tablet Take 1 tablet (20 mg total) by mouth 2 (two) times daily. 11/29/18 04/11/19  Ankita Newcomer, Jarrett Soho, PA-C  Fluticasone Propionate HFA (FLOVENT HFA IN) Inhale into the lungs. Patient not taking: Reported on 11/14/2019  11/14/19  [provider]    Allergies    Azithromycin, Peanut-containing drug products, Iodinated diagnostic agents, Other, Amoxicillin, Fish allergy, Gluten meal, Ibuprofen, and Pantoprazole  Review of Systems   Review of Systems  Constitutional:  Positive for chills, fatigue and fever. Negative for appetite change, diaphoresis and unexpected weight change.  HENT: Positive for sore throat. Negative for mouth sores.   Eyes: Negative for visual disturbance.  Respiratory: Positive for cough, chest tightness and shortness of breath. Negative for wheezing.   Cardiovascular: Positive for chest pain and palpitations. Negative for leg swelling.  Gastrointestinal: Positive for abdominal pain, nausea and vomiting. Negative for constipation and diarrhea.  Endocrine: Negative for polydipsia, polyphagia and polyuria.  Genitourinary: Positive for dysuria (since surgery). Negative for frequency, hematuria and urgency.  Musculoskeletal: Positive for myalgias. Negative for back pain and neck stiffness.  Skin: Negative for rash.  Allergic/Immunologic: Positive for immunocompromised state.  Neurological: Negative for syncope, light-headedness and headaches.  Hematological: Does not bruise/bleed easily.  Psychiatric/Behavioral: Negative for sleep disturbance. The patient is not nervous/anxious.  Physical Exam Updated Vital Signs BP (!) 153/114 (BP Location: Right Arm)   Pulse (!) 129   Temp 98.7 F (37.1 C) (Oral)   Resp (!) 22   Ht 5' 2"  (1.575 m)   Wt 99.8 kg   LMP  (LMP Unknown)   SpO2 99%   BMI 40.24 kg/m   Physical Exam Vitals and nursing note reviewed.  Constitutional:      General: She is not in acute distress.    Appearance: She is ill-appearing. She is not diaphoretic.  HENT:     Head: Normocephalic.  Eyes:     General: No scleral icterus.    Conjunctiva/sclera: Conjunctivae normal.  Cardiovascular:     Rate and Rhythm: Regular rhythm. Tachycardia present.     Pulses: Normal pulses.          Radial pulses are 2+ on the right side and 2+ on the left side.  Pulmonary:     Effort: Tachypnea present. No accessory muscle usage, prolonged expiration, respiratory distress or retractions.     Breath sounds: No stridor. Decreased  breath sounds (throughout) present.     Comments: Equal chest rise. No increased work of breathing. Abdominal:     General: There is no distension.     Palpations: Abdomen is soft.     Tenderness: There is no abdominal tenderness. There is no guarding or rebound.  Musculoskeletal:        General: Normal range of motion.     Cervical back: Normal range of motion.     Comments: Moves all extremities equally and without difficulty.  Skin:    General: Skin is warm and dry.     Capillary Refill: Capillary refill takes less than 2 seconds.  Neurological:     Mental Status: She is alert.     GCS: GCS eye subscore is 4. GCS verbal subscore is 5. GCS motor subscore is 6.     Comments: Speech is clear and goal oriented.  Psychiatric:        Mood and Affect: Mood normal.     ED Results / Procedures / Treatments   Labs (all labs ordered are listed, but only abnormal results are displayed) Labs Reviewed  CBC - Abnormal; Notable for the following components:      Result Value   Hemoglobin 11.2 (*)    HCT 35.6 (*)    MCV 71.9 (*)    MCH 22.6 (*)    RDW 16.6 (*)    Platelets 465 (*)    All other components within normal limits  COMPREHENSIVE METABOLIC PANEL - Abnormal; Notable for the following components:   CO2 21 (*)    Glucose, Bld 114 (*)    Total Bilirubin 0.2 (*)    All other components within normal limits  LIPASE, BLOOD  URINALYSIS, ROUTINE W REFLEX MICROSCOPIC  LACTIC ACID, PLASMA  LACTIC ACID, PLASMA  I-STAT BETA HCG BLOOD, ED ([facility], [facility], [facility] ONLY)  TROPONIN I (HIGH SENSITIVITY)  TROPONIN I (HIGH SENSITIVITY)    EKG EKG Interpretation  Date/Time:  Monday April 04 2020 04:05:24 EDT Ventricular Rate:  127 PR Interval:    QRS Duration: 88 QT Interval:  336 QTC Calculation: 489 R Axis:   79 Text Interpretation: Sinus tachycardia Consider right atrial enlargement Borderline prolonged QT interval 12 Lead; [place] Since last tracing rate faster Otherwise no significant  change Confirmed by Deno Etienne [phone]) on 04/04/2020 4:10:48 AM   Radiology DG Chest 2 View  Result Date: 04/04/2020  CLINICAL DATA:  Shortness of breath.  COVID induced pneumonia. EXAM: CHEST - 2 VIEW COMPARISON:  Chest x-ray 03/01/2020, CT chest 03/18/2020 FINDINGS: The heart size and mediastinal contours are within normal limits. No focal consolidation. No pulmonary edema. No pleural effusion. No pneumothorax. No acute osseous abnormality. IMPRESSION: No active cardiopulmonary disease. Electronically Signed   By: Iven Finn M.D.   On: 04/04/2020 05:23    Procedures Procedures   Medications Ordered in ED Medications  albuterol (VENTOLIN HFA) 108 (90 Base) MCG/ACT inhaler 2 puff (has no administration in time range)  diphenhydrAMINE (BENADRYL) capsule 50 mg (has no administration in time range)    Or  diphenhydrAMINE (BENADRYL) injection 50 mg (has no administration in time range)  hydrocortisone sodium succinate (SOLU-CORTEF) injection 200 mg (200 mg Intravenous Given 04/04/20 0500)  ondansetron (ZOFRAN) injection 4 mg (4 mg Intravenous Given 04/04/20 0452)  morphine 4 MG/ML injection 4 mg (4 mg Intravenous Given 04/04/20 0453)  sodium chloride 0.9 % bolus 1,000 mL (1,000 mLs Intravenous New Bag/Given 04/04/20 0502)    ED Course  I have reviewed the triage vital signs and the nursing notes.  Pertinent labs & imaging results that were available during my care of the patient were reviewed by me and considered in my medical decision making (see chart for details).  Clinical Course as of 04/04/20 0640  Mon Apr 04, 2020  0532 Hemoglobin(!): 11.2 baseline [HM]  0532 DG Chest 2 View No pneumothorax, pulmonary edema or consolidation to suggest pneumonia. [HM]  7078 Lipase: 39 Within normal limits [HM]  0628 ED EKG Tachycardia noted [HM]    Clinical Course User Index [HM] Skip Litke, Gwenlyn Perking   MDM Rules/Calculators/A&P                           Patient presents with  numerous symptoms worsening over the last several days.  She arrives tachycardic and tachypneic.  She is hypertensive.  Given chest pain shortness of breath concern for pulmonary embolus.  Myalgias and fevers give rise to concern for possible new infection versus long Covid.  Abdominal pain diffuse, worst in the periumbilical region with nausea and vomiting.  No hemoptysis or melena.  Also considering colitis.  Labs and imaging pending.  Symptom control and fluids given.  6:29 AM Labs and imaging continue to pend.  Heart rate has improved some with fluids.  Blood pressure improved.  Patient is being premedicated for CT scan.  Trop and lactic acid continue to pend.  BP 115/70   Pulse (!) 118   Temp 99.1 F (37.3 C) (Rectal)   Resp (!) 21   Ht 5' 2"  (1.575 m)   Wt 99.8 kg   LMP  (LMP Unknown)   SpO2 97%   BMI 40.24 kg/m   6:40 AM At shift change care was transferred to [facility] who will follow pending studies, re-evaulate and determine disposition.     Final Clinical Impression(s) / ED Diagnoses Final diagnoses:  Tachycardia    Rx / DC Orders ED Discharge Orders    None       Jance Siek, Gwenlyn Perking 04/04/20 [place], Gloria [place], DO 04/04/20 [phone]

## 2020-04-04 NOTE — Progress Notes (Signed)
[facility] for heparin Indication: hx pulmonary embolus and DVT (home Eliquis on hold)  Allergies  Allergen Reactions  . Azithromycin Anaphylaxis  . Peanut-Containing Drug Products Other (See Comments)    Unknown ; treenuts, shell fish Unknown reaction, took allergy test  . Iodinated Diagnostic Agents Hives and Itching    02/04/2020 Patient stated has history of itching and hives with CT IV Iodine contrast, premedicated with Benadryl 25 mg IV prior to CT IV Iodine scan today.Post CT scan patient reported itching, Benadryl 25 mg IV given, symptoms resolved, NP recommends for patient to get premedicated with Benadryl 50 mg IV prior to future CT IV Iodine scans.   . Other Other (See Comments)    Moderna covid vaccine -Syncope   . Amoxicillin Swelling    Hand swelling   . Fish Allergy Other (See Comments)    Unknown Allergy test   . Gluten Meal Other (See Comments)    Celiac disease  . Hydromet [Hydrocodone-Homatropine] Nausea And Vomiting  . Ibuprofen Nausea And Vomiting  . Pantoprazole Other (See Comments)    Stomach pain    Patient Measurements: Height: 5' 2"  (157.5 cm) Weight: 99.8 kg (220 lb) IBW/kg (Calculated) : 50.1 Heparin Dosing Weight: 74 kg  Vital Signs: Temp: 98.2 F (36.8 C) (03/14 1211) Temp Source: Oral (03/14 1211) BP: 136/94 (03/14 1211) Pulse Rate: 122 (03/14 1211)  Labs: Recent Labs    04/04/20 0500 04/04/20 0630  HGB 11.2*  --   HCT 35.6*  --   PLT 465*  --   CREATININE 0.73  --   TROPONINIHS 5 5    Estimated Creatinine Clearance: 117.8 mL/min (by C-G formula based on SCr of 0.73 mg/dL).   Medications:  - on Eliquis 5 mg bid PTA (last dose taken on 3/13 at 2100)  Assessment: Patient's a 25 y.o with hx lupus,  DVT/PE (in Jan 2022) and COVID-19 in February presented to the ED on 3/14 with c/o of CP, SOB and cough.  Pharmacy has been consulted to start heparin drip while patient is NPO and in case invasive  intervention is needed.  - c/o intermittent hemoptysis on adm - 3/14 chest CTA: neg for PE   Goal of Therapy:  Heparin level 0.3-0.7 units/ml aPTT 66-102 seconds Monitor platelets by anticoagulation protocol: Yes   Plan:  - baseline aPTT and Heparin level now - heparin drip at 1200 units/hr - check 6 hr heparin level and aPTT - monitor for severity of hemoptysis  Lynelle Doctor 04/04/2020,12:47 PM

## 2020-04-04 NOTE — H&P (Signed)
History and Physical        Hospital Admission Note Date: 04/04/2020  Patient name: [name] Medical record number: [id number] Date of birth: [birth year] Age: 25 y.o. Gender: female  PCP: [place]   Chief Complaint    Chief Complaint  Patient presents with  . 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  Associated with intermittent hemoptysis (described as streaks of blood), fever of 104.4F and inability to keep down any foods.  Associated with myalgias.  No relief with Zofran at home.  Has been treating fevers with Tylenol.  Reported chest pain and palpitations at presentation.  She did not miss any doses of Eliquis.  She is on OCPs but does not smoke.  Diagnosed with COVID-19 on 02/24/2020 and was seen in the ED on 2/25 concerning for pneumonia and discharged with doxycycline, which she has completed.  She was seen by Dr. Chase Caller, pulmonary, on 03/22/2020 and rheumatology on 3/2.  She was recommended to continue doxycycline at the time but did not have any improvement and was started on prednisone taper however had hyperglycemia (no diagnosis of diabetes) no improvement in symptoms.  Despite these interventions over the past month her cough has persisted/worsened.  She was advised to come to the ED.  Regarding her abdominal pain, per [facility] Colonoscopy 12/05/2018, normal, BBPS 9. Random biopsies negative for microscopic  colitis. EGD 2019 per pt report with previous gastroenterologist, esophagitis and hiatal hernia noted per pt report.  Unfortunately, she has had adverse reactions or no improvement with PPIs, H2 blockers, etc. In the past.   ED Course: Afebrile, tachycardic, tachypneic,  hemodynamically stable, on room air. Notable Labs: CMP overall unremarkable, WBC 10, Hb 11, platelets 465, troponin 5 x2, lactic acid 2.0-> 2.4. Notable Imaging: CTA chest/CT abdomen pelvis with contrast-unremarkable and without cause for symptoms.  Her complement levels were not low which indicated that her lupus was not active.  Patient received Solu-Cortef, morphine, Zofran, Benadryl, 2 L NS bolus.    Vitals:   04/04/20 1130 04/04/20 1211  BP: (!) 144/91 (!) 136/94  Pulse: (!) 116 (!) 122  Resp: 16 16  Temp:  98.2 F (36.8 C)  SpO2: 99% 97%     Review of Systems:  Review of Systems  All other systems reviewed and are negative.   Medical/Social/Family History   Past Medical History: Past Medical History:  Diagnosis Date  . Asthma   . Celiac disease   . Collagen vascular disease ([place])   . Endometriosis   . Lupus ([place])   . Ovarian cyst   . Pulmonary embolus ([place]) 02/06/2020  . Ulcerative colitis [facility])     Past Surgical History:  Procedure Laterality Date  . APPENDECTOMY    . CHOLECYSTECTOMY    . EXCISION OF ENDOMETRIOMA  Ovarian cyst removal  . FOOT FRACTURE SURGERY     w/ hardware  . HERNIA REPAIR    . TONSILLECTOMY      Medications: Prior to Admission medications   Medication Sig Start Date End Date Taking? Authorizing Provider  albuterol (PROVENTIL) (2.5 MG/3ML) 0.083% nebulizer solution Take 3 mLs (2.5 mg total) by nebulization every 6 (six) hours as needed for wheezing or shortness of breath. 03/19/20   Volney American, PA-C  albuterol (VENTOLIN HFA) 108 (90 Base) MCG/ACT inhaler Inhale 2 puffs into the lungs every 6 (six) hours as needed for wheezing or shortness of breath.     [provider]  ALPRAZolam Duanne Moron) 0.5 MG tablet Take 0.25-0.5 mg by mouth daily as needed for anxiety.    [provider]  apixaban (ELIQUIS) 5 MG TABS tablet Eliquis DVT-PE Treatment 30-Day Starter 5 mg (74 tablets) in dose pack  TAKE 2 TABLETS BY MOUTH TWICE DAILY FOR 7 DAYS THEN TAKE 1 TABLET BY MOUTH TWICE DAILY THEREAFTER    [provider]  chlorpheniramine-HYDROcodone (TUSSIONEX PENNKINETIC ER) 10-8 MG/5ML SUER Take 5 mLs by mouth every 12 (twelve) hours as needed for cough. 03/23/20   Rice, Resa Miner, MD  doxycycline (VIBRAMYCIN) 100 MG capsule Take 1 capsule (100 mg total) by mouth 2 (two) times daily. 03/18/20   Carmin Muskrat, MD  hydroxychloroquine (PLAQUENIL) 200 MG tablet Take 2 tablets (400 mg total) by mouth daily. 03/23/20   Rice, Resa Miner, MD  Norethindrone-Ethinyl Estradiol-Fe Biphas (LO LOESTRIN FE) 1 MG-10 MCG / 10 MCG tablet Take 1 tablet by mouth daily.    [provider]  predniSONE (DELTASONE) 10 MG tablet Take 40 mg x 1 day, 30 mg x 1 day, 20 mg x 1 day, 10 mg x 1 day 5 mg x 1 day then stop 03/24/20   Brand Males, MD  promethazine (PHENERGAN) 12.5 MG tablet Take 1 tablet (12.5 mg total) by mouth every 6 (six) hours as needed for nausea or vomiting. 03/23/20   Collier Salina, MD  sertraline (ZOLOFT) 100 MG tablet Take 200 mg by mouth at bedtime.    [provider]  zolpidem (AMBIEN) 10 MG tablet Take 20 mg by mouth at bedtime as needed for sleep.     [provider]  dicyclomine (BENTYL) 20 MG tablet Take 1 tablet (20 mg total) by mouth 2 (two) times daily. 11/29/18 04/11/19  Muthersbaugh, Jarrett Soho, PA-C  Fluticasone Propionate HFA (FLOVENT HFA IN) Inhale into the lungs. Patient not taking: Reported on 11/14/2019  11/14/19  [provider]    Allergies:   Allergies  Allergen Reactions  . Azithromycin Anaphylaxis  . Peanut-Containing Drug Products Other (See Comments)    Unknown ; treenuts, shell  fish Unknown reaction, took allergy test  . Iodinated Diagnostic Agents Hives and Itching    02/04/2020 Patient stated has history of itching and hives with CT IV Iodine contrast, premedicated with Benadryl 25 mg IV prior to CT IV Iodine scan today.Post CT scan patient reported itching, Benadryl 25 mg IV given, symptoms resolved, NP recommends for patient to get premedicated with Benadryl 50 mg IV prior to future CT IV Iodine scans.   . Other Other (See Comments)    Moderna covid vaccine -Syncope   . Amoxicillin Swelling    Hand swelling   . Fish Allergy Other (See Comments)    Unknown Allergy test   . Gluten Meal Other (See Comments)    Celiac disease  . Hydromet [  Hydrocodone-Homatropine] Nausea And Vomiting  . Ibuprofen Nausea And Vomiting  . Pantoprazole Other (See Comments)    Stomach pain    Social History:  reports that she has never smoked. She has never used smokeless tobacco. She reports that she does not drink alcohol and does not use drugs.  Family History: Family History  Problem Relation Age of Onset  . Hypertension Mother   . Hypercholesterolemia Mother   . Rheum arthritis Mother   . Diabetes Father   . Hypertension Father   . Cancer Father   . Autism Brother      Objective   Physical Exam: Blood pressure (!) 136/94, pulse (!) 122, temperature 98.2 F (36.8 C), temperature source Oral, resp. rate 16, height 5' 2"  (1.575 m), weight 99.8 kg, SpO2 97 %.  Physical Exam Vitals and nursing note reviewed.  Constitutional:      Appearance: Normal appearance. She is obese.  HENT:     Head: Normocephalic and atraumatic.  Eyes:     Conjunctiva/sclera: Conjunctivae normal.  Cardiovascular:     Rate and Rhythm: Regular rhythm. Tachycardia present.  Pulmonary:     Effort: Pulmonary effort is normal. No tachypnea.     Breath sounds: Normal breath sounds. No wheezing or rales.  Abdominal:     General: Abdomen is flat.     Palpations: Abdomen is soft.      Tenderness: There is abdominal tenderness in the epigastric area.  Musculoskeletal:        General: No swelling or tenderness.     Right lower leg: No edema.     Left lower leg: No edema.  Skin:    Coloration: Skin is not jaundiced or pale.  Neurological:     General: No focal deficit present.     Mental Status: She is alert. Mental status is at baseline.  Psychiatric:        Mood and Affect: Mood normal.        Behavior: Behavior normal.     LABS on Admission: I have personally reviewed all the labs and imaging below    Basic Metabolic Panel: Recent Labs  Lab 04/04/20 0500  NA 138  K 3.9  CL 108  CO2 21*  GLUCOSE 114*  BUN 11  CREATININE 0.73  CALCIUM 9.4   Liver Function Tests: Recent Labs  Lab 04/04/20 0500  AST 19  ALT 13  ALKPHOS 65  BILITOT 0.2*  PROT 7.1  ALBUMIN 3.6   Recent Labs  Lab 04/04/20 0500  LIPASE 39   No results for input(s): AMMONIA in the last 168 hours. CBC: Recent Labs  Lab 04/04/20 0500  WBC 10.4  HGB 11.2*  HCT 35.6*  MCV 71.9*  PLT 465*   Cardiac Enzymes: No results for input(s): CKTOTAL, CKMB, CKMBINDEX, TROPONINI in the last 168 hours. BNP: Invalid input(s): POCBNP CBG: No results for input(s): GLUCAP in the last 168 hours.  Radiological Exams on Admission:  DG Chest 2 View  Result Date: 04/04/2020 CLINICAL DATA:  Shortness of breath.  COVID induced pneumonia. EXAM: CHEST - 2 VIEW COMPARISON:  Chest x-ray 03/01/2020, CT chest 03/18/2020 FINDINGS: The heart size and mediastinal contours are within normal limits. No focal consolidation. No pulmonary edema. No pleural effusion. No pneumothorax. No acute osseous abnormality. IMPRESSION: No active cardiopulmonary disease. Electronically Signed   By: Iven Finn M.D.   On: 04/04/2020 05:23   CT Angio Chest PE W and/or Wo Contrast  Result Date: 04/04/2020 CLINICAL DATA:  Chest  pain.  Abdomen pain. EXAM: CT ANGIOGRAPHY CHEST CT ABDOMEN AND PELVIS WITH CONTRAST TECHNIQUE:  Multidetector CT imaging of the chest was performed using the standard protocol during bolus administration of intravenous contrast. Multiplanar CT image reconstructions and MIPs were obtained to evaluate the vascular anatomy. Multidetector CT imaging of the abdomen and pelvis was performed using the standard protocol during bolus administration of intravenous contrast. CONTRAST:  129m OMNIPAQUE IOHEXOL 350 MG/ML SOLN COMPARISON:  None. CT abdomen and pelvis November 13, 2018 1; chest CT March 18, 2020 FINDINGS: CTA CHEST FINDINGS Cardiovascular: There is no evident pulmonary embolus. No thoracic aortic aneurysm or dissection. Visualized great vessels appear normal. No thoracic aortic aneurysm or dissection. Mediastinum/Nodes: Thyroid appears normal. Small amount of thymic tissue is normal for age. No evident thoracic adenopathy. No esophageal lesions evident. Lungs/Pleura: Interval clearing of infiltrate compared to prior CT. Lungs now clear. No pleural effusion. No pneumothorax. Trachea and major bronchial structures appear patent. Musculoskeletal: No blastic or lytic bone lesions. Review of the MIP images confirms the above findings. CT ABDOMEN and PELVIS FINDINGS Hepatobiliary: There is a 5 mm cyst in the anterior segment right lobe of the liver. No other focal liver lesions are evident. Gallbladder is absent. There is no appreciable biliary dilatation. Pancreas: There is no pancreatic mass or inflammatory focus. Spleen: No splenic lesions are evident. Adrenals/Urinary Tract: Adrenals bilaterally appear normal. There is no appreciable renal mass or hydronephrosis on either side. There is no evident renal or ureteral calculus on either side. Urinary bladder is midline with wall thickness within normal limits. Stomach/Bowel: There is no appreciable bowel wall or mesenteric thickening. Moderate stool noted in the colon. No bowel obstruction. Terminal ileum appears normal. No free air or portal venous air.  Appendix absent. No periappendiceal region inflammation. Vascular/Lymphatic: No abdominal aortic aneurysm. No arterial vascular lesions are evident. Major venous structures appear patent. There is no evident adenopathy in the abdomen or pelvis. Reproductive: The uterus is anteverted.  No evident adnexal mass. Other: No abscess or ascites in the abdomen or pelvis. Musculoskeletal: No blastic or lytic bone lesions. No abdominal wall or intramuscular lesions. Review of the MIP images confirms the above findings. IMPRESSION: CT angiogram chest: 1. No demonstrable pulmonary embolus. No thoracic aortic aneurysm or dissection. 2.  Lungs now clear. 3.  No adenopathy. CT abdomen and pelvis: 1. A cause for patient's symptoms has not been established with this study. 2. No bowel wall thickening or bowel obstruction. No abscess in the abdomen or pelvis. Appendix absent. 3.  Gallbladder absent. 4. No evident renal or ureteral calculus. No hydronephrosis. Urinary bladder wall thickness normal. Electronically Signed   By: WLowella GripIII M.D.   On: 04/04/2020 10:17   CT ABDOMEN PELVIS W CONTRAST  Result Date: 04/04/2020 CLINICAL DATA:  Chest pain.  Abdomen pain. EXAM: CT ANGIOGRAPHY CHEST CT ABDOMEN AND PELVIS WITH CONTRAST TECHNIQUE: Multidetector CT imaging of the chest was performed using the standard protocol during bolus administration of intravenous contrast. Multiplanar CT image reconstructions and MIPs were obtained to evaluate the vascular anatomy. Multidetector CT imaging of the abdomen and pelvis was performed using the standard protocol during bolus administration of intravenous contrast. CONTRAST:  1039mOMNIPAQUE IOHEXOL 350 MG/ML SOLN COMPARISON:  None. CT abdomen and pelvis November 13, 2018 1; chest CT March 18, 2020 FINDINGS: CTA CHEST FINDINGS Cardiovascular: There is no evident pulmonary embolus. No thoracic aortic aneurysm or dissection. Visualized great vessels appear normal. No thoracic aortic  aneurysm or dissection.  Mediastinum/Nodes: Thyroid appears normal. Small amount of thymic tissue is normal for age. No evident thoracic adenopathy. No esophageal lesions evident. Lungs/Pleura: Interval clearing of infiltrate compared to prior CT. Lungs now clear. No pleural effusion. No pneumothorax. Trachea and major bronchial structures appear patent. Musculoskeletal: No blastic or lytic bone lesions. Review of the MIP images confirms the above findings. CT ABDOMEN and PELVIS FINDINGS Hepatobiliary: There is a 5 mm cyst in the anterior segment right lobe of the liver. No other focal liver lesions are evident. Gallbladder is absent. There is no appreciable biliary dilatation. Pancreas: There is no pancreatic mass or inflammatory focus. Spleen: No splenic lesions are evident. Adrenals/Urinary Tract: Adrenals bilaterally appear normal. There is no appreciable renal mass or hydronephrosis on either side. There is no evident renal or ureteral calculus on either side. Urinary bladder is midline with wall thickness within normal limits. Stomach/Bowel: There is no appreciable bowel wall or mesenteric thickening. Moderate stool noted in the colon. No bowel obstruction. Terminal ileum appears normal. No free air or portal venous air. Appendix absent. No periappendiceal region inflammation. Vascular/Lymphatic: No abdominal aortic aneurysm. No arterial vascular lesions are evident. Major venous structures appear patent. There is no evident adenopathy in the abdomen or pelvis. Reproductive: The uterus is anteverted.  No evident adnexal mass. Other: No abscess or ascites in the abdomen or pelvis. Musculoskeletal: No blastic or lytic bone lesions. No abdominal wall or intramuscular lesions. Review of the MIP images confirms the above findings. IMPRESSION: CT angiogram chest: 1. No demonstrable pulmonary embolus. No thoracic aortic aneurysm or dissection. 2.  Lungs now clear. 3.  No adenopathy. CT abdomen and pelvis: 1. A cause  for patient's symptoms has not been established with this study. 2. No bowel wall thickening or bowel obstruction. No abscess in the abdomen or pelvis. Appendix absent. 3.  Gallbladder absent. 4. No evident renal or ureteral calculus. No hydronephrosis. Urinary bladder wall thickness normal. Electronically Signed   By: Lowella Grip III M.D.   On: 04/04/2020 10:17      EKG: sinus tachycardia, prolonged QT interval (489 ms)   A & P   Active Problems:   Gastritis   Personal history of pulmonary embolism   Abdominal pain   SLE (systemic lupus erythematosus related syndrome) (HCC)   Shortness of breath   1. 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 a. Currently afebrile and hemodynamically stable on room air without leukocytosis however she is tachycardic with increasing lactic acid level despite fluids and reports fevers of 104.5 F at home treated with Tylenol.  Reports hemoptysis though only streaks of blood b. Failed outpatient doxycycline and prednisone taper c. CTA chest unremarkable d. will hold off on antibiotics for now since she is afebrile without leukocytosis e. PCCM consulted to assist with treatment and question if she needs a bronchoscopy, discussed with Dr. Silas Flood  2. Abdominal pain with nausea and vomiting a. EGD in 2019 showed esophagitis and hiatal herniabut states she is intolerant of PPI, H2 blockers, minimal improvement with Carafate and prior notes have reported insignificant improvement with Bentyl b. Lab work and imaging today overall unremarkable with the exception of lactic acid as above c. GI consulted, question if she needs a repeat EGD? d. N.p.o.  3. Recurrent PE a. CT chest negative today b. change Eliquis to heparin in case she has any upcoming procedures (EGD, bronch, etc.)  4. SLE a. Does not appear to be  in acute flare b. On Plaquenil    DVT prophylaxis:  Heparin   Code Status: Full Code  Diet: N.p.o. Family Communication: Admission, patients condition and plan of care including tests being ordered have been discussed with the patient who indicates understanding and agrees with the plan and Code Status.   Disposition Plan: Patient initially was hoping to go home and return with strict precautions but is now okay with staying for further work-up.  As her lab work overall is unremarkable (except lactic acid) and she is tolerating room air, will make observation pending specialist recommendations   The appropriate patient status for this patient is OBSERVATION. Observation status is judged to be reasonable and necessary in order to provide the required intensity of service to ensure the patient's safety. The patient's presenting symptoms, physical exam findings, and initial radiographic and laboratory data in the context of their medical condition is felt to place them at decreased risk for further clinical deterioration. Furthermore, it is anticipated that the patient will be medically stable for discharge from the hospital within 2 midnights of admission. The following factors support the patient status of observation.   " The patient's presenting symptoms include shortness of breath and abdominal pain. " The physical exam findings include abdominal pain. " The initial radiographic and laboratory data are elevated lactic acid.   The medical decision making on this patient was of high complexity and the patient is at high risk for clinical deterioration, therefore this is a level 3  admission.  Consultants  . GI . PCCM  Procedures  . None  Time Spent on Admission: 80 minutes    Harold Hedge, DO [facility]  04/04/2020, 1:44 PM

## 2020-04-04 NOTE — ED Notes (Signed)
Pt states pain returning, asking for medication. Provider made aware.

## 2020-04-04 NOTE — Progress Notes (Signed)
Pt came from ED to [place] room 1525 She was in the [place] in the ED dur to her HR. Pt was Alert and Ox4. Admission vitals and assessment taken Pt still in the yellow MEWS.Pt was in pain at 8 on a 0-10 scale.

## 2020-04-04 NOTE — ED Notes (Signed)
Critical Lactic report to B. Athena Masse

## 2020-04-04 NOTE — ED Notes (Signed)
ED TO INPATIENT HANDOFF REPORT  Name/Age/Gender [name] 25 y.o. female  Code Status    Code Status Orders  (From admission, onward)         Start     Ordered   04/04/20 1222  Full code  Continuous        04/04/20 1221        Code Status History    This patient has a current code status but no historical code status.   Advance Care Planning Activity      Home/SNF/Other Home  Chief Complaint Abdominal pain [R10.9]  Level of Care/Admitting Diagnosis ED Disposition    ED Disposition Condition Comment   Admit  Hospital Area: [facility] [[id number]]  Level of Care: Telemetry [5]  Admit to tele based on following criteria: Complex arrhythmia (Bradycardia/Tachycardia)  Covid Evaluation: Recent COVID positive no isolation required infection day 21-90  Diagnosis: Abdominal pain [333545]  Admitting Physician: Harold Hedge [[id number]]  Attending Physician: Harold Hedge [[id number]]       Medical History Past Medical History:  Diagnosis Date  . Asthma   . Celiac disease   . Collagen vascular disease ([place])   . Endometriosis   . Lupus ([place])   . Ovarian cyst   . Pulmonary embolus ([place]) 02/06/2020  . Ulcerative colitis ([place])     Allergies Allergies  Allergen Reactions  . Azithromycin Anaphylaxis  . Peanut-Containing Drug Products Other (See Comments)    Unknown ; treenuts, shell fish Unknown reaction, took allergy test  . Iodinated Diagnostic Agents Hives and Itching    02/04/2020 Patient stated has history of itching and hives with CT IV Iodine contrast, premedicated with Benadryl 25 mg IV prior to CT IV Iodine scan today.Post CT scan patient reported itching, Benadryl 25 mg IV given, symptoms resolved, NP recommends for patient to get premedicated with Benadryl 50 mg IV prior to future CT IV Iodine scans.   . Other Other (See Comments)    Moderna covid vaccine -Syncope   . Amoxicillin Swelling    Hand swelling   . Fish Allergy Other  (See Comments)    Unknown Allergy test   . Gluten Meal Other (See Comments)    Celiac disease  . Hydromet [Hydrocodone-Homatropine] Nausea And Vomiting  . Ibuprofen Nausea And Vomiting  . Pantoprazole Other (See Comments)    Stomach pain    IV Location/Drains/Wounds Patient Lines/Drains/Airways Status    Active Line/Drains/Airways    Name Placement date Placement time Site Days   Peripheral IV 04/04/20 Left Antecubital 04/04/20  0452  Antecubital  less than 1          Labs/Imaging Results for orders placed or performed during the hospital encounter of 04/04/20 (from the past 48 hour(s))  CBC     Status: Abnormal   Collection Time: 04/04/20  5:00 AM  Result Value Ref Range   WBC 10.4 4.0 - 10.5 K/uL   RBC 4.95 3.87 - 5.11 MIL/uL   Hemoglobin 11.2 (L) 12.0 - 15.0 g/dL   HCT 35.6 (L) 36.0 - 46.0 %   MCV 71.9 (L) 80.0 - 100.0 fL   MCH 22.6 (L) 26.0 - 34.0 pg   MCHC 31.5 30.0 - 36.0 g/dL   RDW 16.6 (H) 11.5 - 15.5 %   Platelets 465 (H) 150 - 400 K/uL   nRBC 0.0 0.0 - 0.2 %    Comment: Performed at [facility], [place] [address] [place], [place] [address]  Comprehensive metabolic panel     Status: Abnormal   Collection Time: 04/04/20  5:00 AM  Result Value Ref Range   Sodium 138 135 - 145 mmol/L   Potassium 3.9 3.5 - 5.1 mmol/L   Chloride 108 98 - 111 mmol/L   CO2 21 (L) 22 - 32 mmol/L   Glucose, Bld 114 (H) 70 - 99 mg/dL    Comment: Glucose reference range applies only to samples taken after fasting for at least 8 hours.   BUN 11 6 - 20 mg/dL   Creatinine, Ser 0.73 0.44 - 1.00 mg/dL   Calcium 9.4 8.9 - 10.3 mg/dL   Total Protein 7.1 6.5 - 8.1 g/dL   Albumin 3.6 3.5 - 5.0 g/dL   AST 19 15 - 41 U/L   ALT 13 0 - 44 U/L   Alkaline Phosphatase 65 38 - 126 U/L   Total Bilirubin 0.2 (L) 0.3 - 1.2 mg/dL   GFR, Estimated >60 >60 mL/min    Comment: (NOTE) Calculated using the CKD-EPI Creatinine Equation (2021)    Anion gap 9 5 - 15    Comment: Performed  at [facility], [place] [address] [place], [place] [address]  Lipase, blood     Status: None   Collection Time: 04/04/20  5:00 AM  Result Value Ref Range   Lipase 39 11 - 51 U/L    Comment: Performed at [facility], [place] [address] [place], [place] [address]  Troponin I (High Sensitivity)     Status: None   Collection Time: 04/04/20  5:00 AM  Result Value Ref Range   Troponin I (High Sensitivity) 5 <18 ng/L    Comment: (NOTE) Elevated high sensitivity troponin I (hsTnI) values and significant  changes across serial measurements may suggest ACS but many other  chronic and acute conditions are known to elevate hsTnI results.  Refer to the "Links" section for chest pain algorithms and additional  guidance. Performed at [facility], [place] [address], [place], [place] [address]   I-Stat Beta hCG blood, ED ([facility], [facility], [facility] only)     Status: None   Collection Time: 04/04/20  5:08 AM  Result Value Ref Range   I-stat hCG, quantitative <5.0 <5 mIU/mL   Comment 3            Comment:   GEST. AGE      CONC.  (mIU/mL)   <=1 WEEK        5 - 50     2 WEEKS       50 - 500     3 WEEKS       100 - 10,000     4 WEEKS     1,000 - 30,000        FEMALE AND NON-PREGNANT FEMALE:     LESS THAN 5 mIU/mL   Troponin I (High Sensitivity)     Status: None   Collection Time: 04/04/20  6:30 AM  Result Value Ref Range   Troponin I (High Sensitivity) 5 <18 ng/L    Comment: (NOTE) Elevated high sensitivity troponin I (hsTnI) values and significant  changes across serial measurements may suggest ACS but many other  chronic and acute conditions are known to elevate hsTnI results.  Refer to the "Links" section for chest pain algorithms and additional  guidance. Performed at [facility], [place] [address], [place], [place] [address]   Lactic acid, plasma     Status: Abnormal  Collection Time: 04/04/20  6:33 AM  Result Value Ref Range    Lactic Acid, Venous 2.0 (HH) 0.5 - 1.9 mmol/L    Comment: CRITICAL RESULT CALLED TO, READ BACK BY AND VERIFIED WITH: Latitia Housewright,B. RN AT [phone] 04/04/20 MULLINS,T Performed at [facility], [place] [address], [place], [place] [address]   Urinalysis, Routine w reflex microscopic Urine, Clean Catch     Status: None   Collection Time: 04/04/20  8:08 AM  Result Value Ref Range   Color, Urine YELLOW YELLOW   APPearance CLEAR CLEAR   Specific Gravity, Urine 1.017 1.005 - 1.030   pH 5.0 5.0 - 8.0   Glucose, UA NEGATIVE NEGATIVE mg/dL   Hgb urine dipstick NEGATIVE NEGATIVE   Bilirubin Urine NEGATIVE NEGATIVE   Ketones, ur NEGATIVE NEGATIVE mg/dL   Protein, ur NEGATIVE NEGATIVE mg/dL   Nitrite NEGATIVE NEGATIVE   Leukocytes,Ua NEGATIVE NEGATIVE    Comment: Performed at [facility], [place] [address] [place], [place] [address]  Lactic acid, plasma     Status: Abnormal   Collection Time: 04/04/20  8:08 AM  Result Value Ref Range   Lactic Acid, Venous 2.4 (HH) 0.5 - 1.9 mmol/L    Comment: CRITICAL VALUE NOTED.  VALUE IS CONSISTENT WITH PREVIOUSLY REPORTED AND CALLED VALUE. Performed at [facility], [place] [address], [place], [place] [address]   Lactic acid, plasma     Status: Abnormal   Collection Time: 04/04/20 11:01 AM  Result Value Ref Range   Lactic Acid, Venous 2.6 (HH) 0.5 - 1.9 mmol/L    Comment: CRITICAL VALUE NOTED.  VALUE IS CONSISTENT WITH PREVIOUSLY REPORTED AND CALLED VALUE. Performed at [facility], [place] [address] [place], [place] [address]    DG Chest 2 View  Result Date: 04/04/2020 CLINICAL DATA:  Shortness of breath.  COVID induced pneumonia. EXAM: CHEST - 2 VIEW COMPARISON:  Chest x-ray 03/01/2020, CT chest 03/18/2020 FINDINGS: The heart size and mediastinal contours are within normal limits. No focal consolidation. No pulmonary edema. No pleural effusion. No pneumothorax. No acute osseous abnormality.  IMPRESSION: No active cardiopulmonary disease. Electronically Signed   By: Iven Finn M.D.   On: 04/04/2020 05:23   CT Angio Chest PE W and/or Wo Contrast  Result Date: 04/04/2020 CLINICAL DATA:  Chest pain.  Abdomen pain. EXAM: CT ANGIOGRAPHY CHEST CT ABDOMEN AND PELVIS WITH CONTRAST TECHNIQUE: Multidetector CT imaging of the chest was performed using the standard protocol during bolus administration of intravenous contrast. Multiplanar CT image reconstructions and MIPs were obtained to evaluate the vascular anatomy. Multidetector CT imaging of the abdomen and pelvis was performed using the standard protocol during bolus administration of intravenous contrast. CONTRAST:  159m OMNIPAQUE IOHEXOL 350 MG/ML SOLN COMPARISON:  None. CT abdomen and pelvis November 13, 2018 1; chest CT March 18, 2020 FINDINGS: CTA CHEST FINDINGS Cardiovascular: There is no evident pulmonary embolus. No thoracic aortic aneurysm or dissection. Visualized great vessels appear normal. No thoracic aortic aneurysm or dissection. Mediastinum/Nodes: Thyroid appears normal. Small amount of thymic tissue is normal for age. No evident thoracic adenopathy. No esophageal lesions evident. Lungs/Pleura: Interval clearing of infiltrate compared to prior CT. Lungs now clear. No pleural effusion. No pneumothorax. Trachea and major bronchial structures appear patent. Musculoskeletal: No blastic or lytic bone lesions. Review of the MIP images confirms the above findings. CT ABDOMEN and PELVIS FINDINGS Hepatobiliary: There is a 5 mm cyst in the anterior segment right lobe of the liver. No  other focal liver lesions are evident. Gallbladder is absent. There is no appreciable biliary dilatation. Pancreas: There is no pancreatic mass or inflammatory focus. Spleen: No splenic lesions are evident. Adrenals/Urinary Tract: Adrenals bilaterally appear normal. There is no appreciable renal mass or hydronephrosis on either side. There is no evident renal or  ureteral calculus on either side. Urinary bladder is midline with wall thickness within normal limits. Stomach/Bowel: There is no appreciable bowel wall or mesenteric thickening. Moderate stool noted in the colon. No bowel obstruction. Terminal ileum appears normal. No free air or portal venous air. Appendix absent. No periappendiceal region inflammation. Vascular/Lymphatic: No abdominal aortic aneurysm. No arterial vascular lesions are evident. Major venous structures appear patent. There is no evident adenopathy in the abdomen or pelvis. Reproductive: The uterus is anteverted.  No evident adnexal mass. Other: No abscess or ascites in the abdomen or pelvis. Musculoskeletal: No blastic or lytic bone lesions. No abdominal wall or intramuscular lesions. Review of the MIP images confirms the above findings. IMPRESSION: CT angiogram chest: 1. No demonstrable pulmonary embolus. No thoracic aortic aneurysm or dissection. 2.  Lungs now clear. 3.  No adenopathy. CT abdomen and pelvis: 1. A cause for patient's symptoms has not been established with this study. 2. No bowel wall thickening or bowel obstruction. No abscess in the abdomen or pelvis. Appendix absent. 3.  Gallbladder absent. 4. No evident renal or ureteral calculus. No hydronephrosis. Urinary bladder wall thickness normal. Electronically Signed   By: Lowella Grip III M.D.   On: 04/04/2020 10:17   CT ABDOMEN PELVIS W CONTRAST  Result Date: 04/04/2020 CLINICAL DATA:  Chest pain.  Abdomen pain. EXAM: CT ANGIOGRAPHY CHEST CT ABDOMEN AND PELVIS WITH CONTRAST TECHNIQUE: Multidetector CT imaging of the chest was performed using the standard protocol during bolus administration of intravenous contrast. Multiplanar CT image reconstructions and MIPs were obtained to evaluate the vascular anatomy. Multidetector CT imaging of the abdomen and pelvis was performed using the standard protocol during bolus administration of intravenous contrast. CONTRAST:  159m  OMNIPAQUE IOHEXOL 350 MG/ML SOLN COMPARISON:  None. CT abdomen and pelvis November 13, 2018 1; chest CT March 18, 2020 FINDINGS: CTA CHEST FINDINGS Cardiovascular: There is no evident pulmonary embolus. No thoracic aortic aneurysm or dissection. Visualized great vessels appear normal. No thoracic aortic aneurysm or dissection. Mediastinum/Nodes: Thyroid appears normal. Small amount of thymic tissue is normal for age. No evident thoracic adenopathy. No esophageal lesions evident. Lungs/Pleura: Interval clearing of infiltrate compared to prior CT. Lungs now clear. No pleural effusion. No pneumothorax. Trachea and major bronchial structures appear patent. Musculoskeletal: No blastic or lytic bone lesions. Review of the MIP images confirms the above findings. CT ABDOMEN and PELVIS FINDINGS Hepatobiliary: There is a 5 mm cyst in the anterior segment right lobe of the liver. No other focal liver lesions are evident. Gallbladder is absent. There is no appreciable biliary dilatation. Pancreas: There is no pancreatic mass or inflammatory focus. Spleen: No splenic lesions are evident. Adrenals/Urinary Tract: Adrenals bilaterally appear normal. There is no appreciable renal mass or hydronephrosis on either side. There is no evident renal or ureteral calculus on either side. Urinary bladder is midline with wall thickness within normal limits. Stomach/Bowel: There is no appreciable bowel wall or mesenteric thickening. Moderate stool noted in the colon. No bowel obstruction. Terminal ileum appears normal. No free air or portal venous air. Appendix absent. No periappendiceal region inflammation. Vascular/Lymphatic: No abdominal aortic aneurysm. No arterial vascular lesions are evident. Major venous structures appear  patent. There is no evident adenopathy in the abdomen or pelvis. Reproductive: The uterus is anteverted.  No evident adnexal mass. Other: No abscess or ascites in the abdomen or pelvis. Musculoskeletal: No blastic or  lytic bone lesions. No abdominal wall or intramuscular lesions. Review of the MIP images confirms the above findings. IMPRESSION: CT angiogram chest: 1. No demonstrable pulmonary embolus. No thoracic aortic aneurysm or dissection. 2.  Lungs now clear. 3.  No adenopathy. CT abdomen and pelvis: 1. A cause for patient's symptoms has not been established with this study. 2. No bowel wall thickening or bowel obstruction. No abscess in the abdomen or pelvis. Appendix absent. 3.  Gallbladder absent. 4. No evident renal or ureteral calculus. No hydronephrosis. Urinary bladder wall thickness normal. Electronically Signed   By: Lowella Grip III M.D.   On: 04/04/2020 10:17    Pending Labs Unresulted Labs (From admission, onward)          Start     Ordered   04/05/20 0500  CBC  Daily,   R      04/04/20 1221   04/05/20 0500  Comprehensive metabolic panel  Daily,   R      04/04/20 1221   04/04/20 1222  HIV Antibody (routine testing w rflx)  (HIV Antibody (Routine testing w reflex) panel)  Once,   STAT        04/04/20 1221   04/04/20 1222  TSH  Add-on,   AD        04/04/20 1221   04/04/20 1220  Heparin level (unfractionated)  ONCE - STAT,   STAT        04/04/20 1219   04/04/20 1220  APTT  ONCE - STAT,   STAT        04/04/20 1219   04/04/20 1207  Resp Panel by RT-PCR (Flu A&B, Covid) Nasopharyngeal Swab  (Tier 2 - Symptomatic/asymptomatic with Precautions )  Once,   STAT       Question Answer Comment  Is this test for diagnosis or screening Screening   Symptomatic for COVID-19 as defined by CDC No   Hospitalized for COVID-19 No   Admitted to ICU for COVID-19 No   Previously tested for COVID-19 Yes   Resident in a congregate (group) care setting No   Employed in healthcare setting No   Pregnant No   Has patient completed COVID vaccination(s) (2 doses of Pfizer/Moderna 1 dose of [employer]) Unknown      04/04/20 1207   04/04/20 1101  Lactic acid, plasma  STAT Now then every 3 hours,   R  (with STAT occurrences)      04/04/20 1100          Vitals/Pain Today's Vitals   04/04/20 0900 04/04/20 1115 04/04/20 1130 04/04/20 1211  BP: 139/89 (!) 146/69 (!) 144/91 (!) 136/94  Pulse: (!) 123 (!) 118 (!) 116 (!) 122  Resp: (!) 24 (!) 23 16 16   Temp:    98.2 F (36.8 C)  TempSrc:    Oral  SpO2: 97% 97% 99% 97%  Weight:      Height:      PainSc:        Isolation Precautions Airborne and Contact precautions  Medications Medications  albuterol (VENTOLIN HFA) 108 (90 Base) MCG/ACT inhaler 2 puff (has no administration in time range)  apixaban (ELIQUIS) tablet 5 mg (0 mg Oral Hold 04/04/20 1204)  hydroxychloroquine (PLAQUENIL) tablet 400 mg (has no administration in time range)  sertraline (ZOLOFT) tablet 200 mg (has no administration in time range)  ALPRAZolam (XANAX) tablet 0.25-0.5 mg (has no administration in time range)  morphine 2 MG/ML injection 2 mg (has no administration in time range)  polyethylene glycol (MIRALAX / GLYCOLAX) packet 17 g (has no administration in time range)  sodium chloride flush (NS) 0.9 % injection 3 mL (has no administration in time range)  0.9 %  sodium chloride infusion (has no administration in time range)  hydrocortisone sodium succinate (SOLU-CORTEF) injection 200 mg (200 mg Intravenous Given 04/04/20 0500)  diphenhydrAMINE (BENADRYL) capsule 50 mg ( Oral See Alternative 04/04/20 0807)    Or  diphenhydrAMINE (BENADRYL) injection 50 mg (50 mg Intravenous Given 04/04/20 0807)  ondansetron (ZOFRAN) injection 4 mg (4 mg Intravenous Given 04/04/20 0452)  morphine 4 MG/ML injection 4 mg (4 mg Intravenous Given 04/04/20 0453)  sodium chloride 0.9 % bolus 1,000 mL (0 mLs Intravenous Stopped 04/04/20 1032)  morphine 4 MG/ML injection 4 mg (4 mg Intravenous Given 04/04/20 0701)  iohexol (OMNIPAQUE) 350 MG/ML injection 100 mL (100 mLs Intravenous Contrast Given 04/04/20 0918)  sodium chloride 0.9 % bolus 1,000 mL (1,000 mLs Intravenous New Bag/Given  04/04/20 1032)    Mobility walks

## 2020-04-04 NOTE — ED Provider Notes (Signed)
Care handoff received from [facility] PA-C at shift change please see previous providers note for full details of visit.  In short 25 year old female presented with numerous complaints.  She was tachycardic tachypneic and hypertensive on arrival complaining of chest pain shortness of breath malaise and fevers.  She is a history of pulmonary embolisms.  Also with diffuse abdominal pain.  Patient with contrast allergy she was premedicated I have been asked to follow-up on CT angio chest PE study and CT abdomen pelvis. Physical Exam  BP 126/71   Pulse (!) 113   Temp 99.1 F (37.3 C) (Rectal)   Resp (!) 23   Ht 5' 2"  (1.575 m)   Wt 99.8 kg   LMP  (LMP Unknown)   SpO2 98%   BMI 40.24 kg/m   Physical Exam Constitutional:      General: She is not in acute distress.    Appearance: Normal appearance. She is well-developed. She is obese. She is not ill-appearing or diaphoretic.  HENT:     Head: Normocephalic and atraumatic.  Eyes:     General: Vision grossly intact. Gaze aligned appropriately.     Pupils: Pupils are equal, round, and reactive to light.  Neck:     Trachea: Trachea and phonation normal.  Pulmonary:     Effort: Pulmonary effort is normal. No respiratory distress.  Abdominal:     General: There is no distension.     Palpations: Abdomen is soft.     Tenderness: There is no abdominal tenderness. There is no guarding or rebound.  Musculoskeletal:        General: Normal range of motion.     Cervical back: Normal range of motion.  Skin:    General: Skin is warm and dry.  Neurological:     Mental Status: She is alert.     GCS: GCS eye subscore is 4. GCS verbal subscore is 5. GCS motor subscore is 6.     Comments: Speech is clear and goal oriented, follows commands Major Cranial nerves without deficit, no facial droop Moves extremities without ataxia, coordination intact  Psychiatric:        Behavior: Behavior normal.     ED Course/Procedures   Clinical Course as  of 04/04/20 0654  Mon Apr 04, 2020  0532 Hemoglobin(!): 11.2 baseline [HM]  0532 DG Chest 2 View No pneumothorax, pulmonary edema or consolidation to suggest pneumonia. [HM]  5681 Lipase: 39 Within normal limits [HM]  0628 ED EKG Tachycardia noted [HM]    Clinical Course User Index [HM] Muthersbaugh, Jarrett Soho, PA-C    Procedures  MDM  CBC without leukocytosis, mild anemia 11.2. Lipase within normal limits. Beta-hCG negative. High-sensitivity troponin within normal limits. Urinalysis without evidence of infection. CMP shows no emergent electrolyte derangement, AKI, LFT elevations or gap. Lactic acid elevated 2.0, delta increased to 2.4.  CXR:  IMPRESSION:  No active cardiopulmonary disease.   CT Angio Chest PE Study and CT AP:  IMPRESSION:  CT angiogram chest:    1. No demonstrable pulmonary embolus. No thoracic aortic aneurysm or  dissection.    2. Lungs now clear.    3. No adenopathy.    CT abdomen and pelvis:    1. A cause for patient's symptoms has not been established with this  study.    2. No bowel wall thickening or bowel obstruction. No abscess in the  abdomen or pelvis. Appendix absent.    3. Gallbladder absent.    4. No evident renal or  ureteral calculus. No hydronephrosis. Urinary  bladder wall thickness normal.  ------------------- Patient reassessed she is resting in bed no acute distress but remains tachycardic.  She reports continued abdominal pain and chest pain, additional pain medications ordered.  No clear cause for patient's symptoms at this time, suspect viral illness.  No evidence for bacterial infection requiring antibiotics at this time, doubt bacterial infection.  Plan for observation admission given worsening lactic acidosis and continued tachycardia.  Discussed this plan with patient she is currently agreeable, she is concerned about her dog at home though.  Consult placed to medicine team spoke with Dr. Neysa Bonito and patient was  accepted to medicine service.  Note: Portions of this report may have been transcribed using voice recognition software. Every effort was made to ensure accuracy; however, inadvertent computerized transcription errors may still be present.   Deliah Boston, PA-C 04/04/20 [place], [place], DO 04/05/20 0234

## 2020-04-04 NOTE — Consult Note (Signed)
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  History of Present Illness:  Note from pulmonary visit 03/22/2020 reviewed.  History per EMR as well as patient.  Patient feeling ill middle of February thinks he was diagnosed with Covid late February 2022.  Symptom include bad cough, bad shortness of breath, dyspnea on exertion.  The symptoms have persisted.  Cough worse over the last few days.  With ongoing shortness of breath.  She presented to the ED 03/21/2020 and CTA PE protocol at that time on my interpretation revealed patchy groundglass opacities with predilection for for the periphery in the upper lobes and lower lobes bilaterally.  She was given doxycycline.  She presented to [facility] pulmonary clinic and saw Dr. Chase Caller 03/22/2020.  Recommended conservative measures.  A flurry of messages went back and forth she was started on prednisone taper.  Prednisone made her blood sugars go high and she felt unwell, foggy mental status.  She is unsure if the prednisone helped her breathing.  She cannot say that it certainly did but states that that time is very foggy and cannot recall.  She completed prednisone taper.  Cough worsened.  Shortness of breath persisted.  This prompted request for refill of Tussionex.  She knew she would need to be seen by a provider.  She went to urgent care and was told to go to the ED.  She called the on-call pulmonary physician after hours was also told to go to the ED.  So she presented to the ED for ongoing cough expecting a prescription to be sent home.  CTA abdomen pelvis and chest was without  abnormality and on my interpretation of the chest portion revealed clear lungs bilaterally with resolution of prior groundglass opacities, no other infiltrate, no interstitial changes or pleural effusions.  She was tachycardic.  Lactate was checked and trended up from 2-2.6.  This prompted observation and admission to [facility].  Cough is persistent.  Nonproductive.  No timing during day were better or worse.  Test next helps.  No other alleviating or exacerbating factors.  Shortness of breath is constant, no positional changes.  Albuterol has helped some at home.  No other relieving or exacerbating factors.  No timing during the day was better or worse.  All these are acute, no environmental changes.  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 rate 18, height 5' 2"  (1.575 m), weight 99.8 kg, SpO2 98 %.        Intake/Output Summary (Last 24 hours) at 04/04/2020 1424 Last data filed at 04/04/2020 1032 Gross per 24 hour  Intake 1000 ml  Output --  Net 1000 ml   Danley Danker  Weights   04/04/20 0358  Weight: 99.8 kg    Examination: General: Well-appearing, lying in bed Eyes: EOMI, no icterus Neck: Supple, no JVD appreciated Lungs: Clear to auscultation bilaterally, no wheeze Cardiovascular: Tachycardic, no murmurs Abdomen: Nondistended, bowel sounds present MSK: No synovitis, no joint effusion Neuro: No weakness, sensation intact Psych: Normal mood, full  affect  Resolved Hospital Problem list   N/a  Assessment & Plan:  Shortness of breath/dyspnea exertion/cough: Suspect are related to recent COVID-19 infection.  Suspect will improve with time.  High suspicion for unmasking or exacerbating underlying asthma.  Unclear prednisone helped her breathing symptoms, certainly made her feel bad overall with high sugars etc.  Will offer inhaled therapies for asthma.  Fortunately, CT chest is clear with resolution of GGO's, this indicates that her lung parenchyma is not the issue, possible asthma as above.  We will further investigate other causes of dyspnea such as cardiac etiologies. --Nebulized Yupelri once daily, budesonide twice daily, arformoterol twice daily --Agree with TTE --No infiltrate, therefore no role for bronchoscopy  Lactic acidosis: No clear cause, query of mildly dehydrated with recent nausea vomiting.  No clear source of infection.  Denies albuterol or epinephrine use which could cause a type B lactic acidosis. --Avoiding albuterol for now, once lactic acidosis resolves, can offer as needed albuterol nebulizer  Best practice (evaluated daily)  Per primary  Goals of Care:  Per primary  Labs   CBC: Recent Labs  Lab 04/04/20 0500  WBC 10.4  HGB 11.2*  HCT 35.6*  MCV 71.9*  PLT 465*    Basic Metabolic Panel: Recent Labs  Lab 04/04/20 0500  NA 138  K 3.9  CL 108  CO2 21*  GLUCOSE 114*  BUN 11  CREATININE 0.73  CALCIUM 9.4   GFR: Estimated Creatinine Clearance: 117.8 mL/min (by C-G formula based on SCr of 0.73 mg/dL). Recent Labs  Lab 04/04/20 0500 04/04/20 0633 04/04/20 0808 04/04/20 1101  WBC 10.4  --   --   --   LATICACIDVEN  --  2.0* 2.4* 2.6*    Liver Function Tests: Recent Labs  Lab 04/04/20 0500  AST 19  ALT 13  ALKPHOS 65  BILITOT 0.2*  PROT 7.1  ALBUMIN 3.6   Recent Labs  Lab 04/04/20 0500  LIPASE 39   No results for input(s): AMMONIA in the last 168 hours.  ABG No results found  for: PHART, PCO2ART, PO2ART, HCO3, TCO2, ACIDBASEDEF, O2SAT   Coagulation Profile: No results for input(s): INR, PROTIME in the last 168 hours.  Cardiac Enzymes: No results for input(s): CKTOTAL, CKMB, CKMBINDEX, TROPONINI in the last 168 hours.  HbA1C: No results found for: HGBA1C  CBG: No results for input(s): GLUCAP in the last 168 hours.  Review of Systems:   No chest pain with exertion.  No orthopnea or PND.  Does have intermittent lower extremity swelling.  Comprehensive review of systems otherwise negative  Past Medical History:  She,  has a past medical history of Asthma, Celiac disease, Collagen vascular disease ([place]), Endometriosis, Lupus ([place]), Ovarian cyst, Pulmonary embolus ([place]) (02/06/2020), and Ulcerative colitis ([place]).   Surgical History:   Past Surgical History:  Procedure Laterality Date  . APPENDECTOMY    . CHOLECYSTECTOMY    . EXCISION OF ENDOMETRIOMA     Ovarian cyst removal  . FOOT FRACTURE SURGERY     w/ hardware  . HERNIA REPAIR    . TONSILLECTOMY       Social History:   reports  that she has never smoked. She has never used smokeless tobacco. She reports that she does not drink alcohol and does not use drugs.   Family History:  Her family history includes Autism in her brother; Cancer in her father; Diabetes in her father; Hypercholesterolemia in her mother; Hypertension in her father and mother; Rheum arthritis in her mother.   Allergies Allergies  Allergen Reactions  . Azithromycin Anaphylaxis  . Peanut-Containing Drug Products Other (See Comments)    Unknown ; treenuts, shell fish Unknown reaction, took allergy test  . Iodinated Diagnostic Agents Hives and Itching    02/04/2020 Patient stated has history of itching and hives with CT IV Iodine contrast, premedicated with Benadryl 25 mg IV prior to CT IV Iodine scan today.Post CT scan patient reported itching, Benadryl 25 mg IV given, symptoms resolved, NP recommends for patient to get  premedicated with Benadryl 50 mg IV prior to future CT IV Iodine scans.   . Other Other (See Comments)    Moderna covid vaccine -Syncope   . Amoxicillin Swelling    Hand swelling   . Fish Allergy Other (See Comments)    Unknown Allergy test   . Gluten Meal Other (See Comments)    Celiac disease  . Hydromet [Hydrocodone-Homatropine] Nausea And Vomiting  . Ibuprofen Nausea And Vomiting  . Pantoprazole Other (See Comments)    Stomach pain     Home Medications  Prior to Admission medications   Medication Sig Start Date End Date Taking? Authorizing Provider  albuterol (PROVENTIL) (2.5 MG/3ML) 0.083% nebulizer solution Take 3 mLs (2.5 mg total) by nebulization every 6 (six) hours as needed for wheezing or shortness of breath. 03/19/20  Yes Volney American, PA-C  albuterol (VENTOLIN HFA) 108 (90 Base) MCG/ACT inhaler Inhale 2 puffs into the lungs every 6 (six) hours as needed for wheezing or shortness of breath.   Yes [provider]  ALPRAZolam Duanne Moron) 0.5 MG tablet Take 0.25-0.5 mg by mouth daily as needed for anxiety.   Yes [provider]  apixaban (ELIQUIS) 5 MG TABS tablet Take 5 mg by mouth 2 (two) times daily.   Yes [provider]  hydroxychloroquine (PLAQUENIL) 200 MG tablet Take 2 tablets (400 mg total) by mouth daily. 03/23/20  Yes Rice, Resa Miner, MD  Norethindrone-Ethinyl Estradiol-Fe Biphas (LO LOESTRIN FE) 1 MG-10 MCG / 10 MCG tablet Take 1 tablet by mouth daily.   Yes [provider]  promethazine (PHENERGAN) 12.5 MG tablet Take 1 tablet (12.5 mg total) by mouth every 6 (six) hours as needed for nausea or vomiting. 03/23/20  Yes Rice, Resa Miner, MD  sertraline (ZOLOFT) 100 MG tablet Take 200 mg by mouth at bedtime.   Yes [provider]  zolpidem (AMBIEN) 10 MG tablet Take 20 mg by mouth at bedtime as needed for sleep.    Yes [provider]  chlorpheniramine-HYDROcodone (TUSSIONEX PENNKINETIC ER) 10-8 MG/5ML SUER  Take 5 mLs by mouth every 12 (twelve) hours as needed for cough. Patient not taking: Reported on 04/04/2020 03/23/20   Collier Salina, MD  doxycycline (VIBRAMYCIN) 100 MG capsule Take 1 capsule (100 mg total) by mouth 2 (two) times daily. Patient not taking: Reported on 04/04/2020 03/18/20   Carmin Muskrat, MD  predniSONE (DELTASONE) 10 MG tablet Take 40 mg x 1 day, 30 mg x 1 day, 20 mg x 1 day, 10 mg x 1 day 5 mg x 1 day then stop Patient not taking: Reported on 04/04/2020 03/24/20  Brand Males, MD  dicyclomine (BENTYL) 20 MG tablet Take 1 tablet (20 mg total) by mouth 2 (two) times daily. 11/29/18 04/11/19  Muthersbaugh, Jarrett Soho, PA-C  Fluticasone Propionate HFA (FLOVENT HFA IN) Inhale into the lungs. Patient not taking: Reported on 11/14/2019  11/14/19  [provider]     Critical care time: n/a

## 2020-04-05 ENCOUNTER — Inpatient Hospital Stay (HOSPITAL_COMMUNITY): Payer: PPO

## 2020-04-05 DIAGNOSIS — Z8261 Family history of arthritis: Secondary | ICD-10-CM | POA: Diagnosis not present

## 2020-04-05 DIAGNOSIS — R000 Tachycardia, unspecified: Secondary | ICD-10-CM | POA: Diagnosis present

## 2020-04-05 DIAGNOSIS — Z20822 Contact with and (suspected) exposure to covid-19: Secondary | ICD-10-CM | POA: Diagnosis present

## 2020-04-05 DIAGNOSIS — R110 Nausea: Secondary | ICD-10-CM | POA: Diagnosis not present

## 2020-04-05 DIAGNOSIS — K2900 Acute gastritis without bleeding: Secondary | ICD-10-CM | POA: Diagnosis not present

## 2020-04-05 DIAGNOSIS — R0602 Shortness of breath: Secondary | ICD-10-CM

## 2020-04-05 DIAGNOSIS — Z833 Family history of diabetes mellitus: Secondary | ICD-10-CM | POA: Diagnosis not present

## 2020-04-05 DIAGNOSIS — M329 Systemic lupus erythematosus, unspecified: Secondary | ICD-10-CM | POA: Diagnosis present

## 2020-04-05 DIAGNOSIS — R1010 Upper abdominal pain, unspecified: Secondary | ICD-10-CM

## 2020-04-05 DIAGNOSIS — K254 Chronic or unspecified gastric ulcer with hemorrhage: Secondary | ICD-10-CM | POA: Diagnosis present

## 2020-04-05 DIAGNOSIS — Z6841 Body Mass Index (BMI) 40.0 and over, adult: Secondary | ICD-10-CM | POA: Diagnosis not present

## 2020-04-05 DIAGNOSIS — Z86718 Personal history of other venous thrombosis and embolism: Secondary | ICD-10-CM | POA: Diagnosis not present

## 2020-04-05 DIAGNOSIS — Z83438 Family history of other disorder of lipoprotein metabolism and other lipidemia: Secondary | ICD-10-CM | POA: Diagnosis not present

## 2020-04-05 DIAGNOSIS — Z8249 Family history of ischemic heart disease and other diseases of the circulatory system: Secondary | ICD-10-CM | POA: Diagnosis not present

## 2020-04-05 DIAGNOSIS — K2970 Gastritis, unspecified, without bleeding: Secondary | ICD-10-CM | POA: Diagnosis present

## 2020-04-05 DIAGNOSIS — J9811 Atelectasis: Secondary | ICD-10-CM | POA: Diagnosis present

## 2020-04-05 DIAGNOSIS — G43909 Migraine, unspecified, not intractable, without status migrainosus: Secondary | ICD-10-CM | POA: Diagnosis present

## 2020-04-05 DIAGNOSIS — J45909 Unspecified asthma, uncomplicated: Secondary | ICD-10-CM | POA: Diagnosis present

## 2020-04-05 DIAGNOSIS — R042 Hemoptysis: Secondary | ICD-10-CM | POA: Diagnosis present

## 2020-04-05 DIAGNOSIS — Z86711 Personal history of pulmonary embolism: Secondary | ICD-10-CM | POA: Diagnosis not present

## 2020-04-05 DIAGNOSIS — E872 Acidosis: Secondary | ICD-10-CM | POA: Diagnosis present

## 2020-04-05 DIAGNOSIS — K900 Celiac disease: Secondary | ICD-10-CM | POA: Diagnosis present

## 2020-04-05 DIAGNOSIS — K2100 Gastro-esophageal reflux disease with esophagitis, without bleeding: Secondary | ICD-10-CM | POA: Diagnosis present

## 2020-04-05 DIAGNOSIS — U099 Post covid-19 condition, unspecified: Secondary | ICD-10-CM | POA: Diagnosis present

## 2020-04-05 DIAGNOSIS — R0609 Other forms of dyspnea: Secondary | ICD-10-CM | POA: Diagnosis present

## 2020-04-05 DIAGNOSIS — N809 Endometriosis, unspecified: Secondary | ICD-10-CM | POA: Diagnosis present

## 2020-04-05 DIAGNOSIS — I4581 Long QT syndrome: Secondary | ICD-10-CM | POA: Diagnosis present

## 2020-04-05 LAB — ECHOCARDIOGRAM COMPLETE
Area-P 1/2: 6.32 cm2
Calc EF: 61.2 %
Height: 62 in
S' Lateral: 2.47 cm
Single Plane A2C EF: 56.8 %
Single Plane A4C EF: 64.4 %
Weight: 3520 oz

## 2020-04-05 LAB — COMPREHENSIVE METABOLIC PANEL WITH GFR
ALT: 13 U/L (ref 0–44)
AST: 18 U/L (ref 15–41)
Albumin: 3.6 g/dL (ref 3.5–5.0)
Alkaline Phosphatase: 60 U/L (ref 38–126)
Anion gap: 11 (ref 5–15)
BUN: 9 mg/dL (ref 6–20)
CO2: 21 mmol/L — ABNORMAL LOW (ref 22–32)
Calcium: 8.8 mg/dL — ABNORMAL LOW (ref 8.9–10.3)
Chloride: 106 mmol/L (ref 98–111)
Creatinine, Ser: 0.8 mg/dL (ref 0.44–1.00)
GFR, Estimated: >60 mL/min
Glucose, Bld: 100 mg/dL — ABNORMAL HIGH (ref 70–99)
Potassium: 3.5 mmol/L (ref 3.5–5.1)
Sodium: 138 mmol/L (ref 135–145)
Total Bilirubin: 0.5 mg/dL (ref 0.3–1.2)
Total Protein: 6.6 g/dL (ref 6.5–8.1)

## 2020-04-05 LAB — CBC
HCT: 32.8 % — ABNORMAL LOW (ref 36.0–46.0)
Hemoglobin: 10.2 g/dL — ABNORMAL LOW (ref 12.0–15.0)
MCH: 23 pg — ABNORMAL LOW (ref 26.0–34.0)
MCHC: 31.1 g/dL (ref 30.0–36.0)
MCV: 73.9 fL — ABNORMAL LOW (ref 80.0–100.0)
Platelets: 408 10*3/uL — ABNORMAL HIGH (ref 150–400)
RBC: 4.44 MIL/uL (ref 3.87–5.11)
RDW: 16.6 % — ABNORMAL HIGH (ref 11.5–15.5)
WBC: 11.1 10*3/uL — ABNORMAL HIGH (ref 4.0–10.5)
nRBC: 0 % (ref 0.0–0.2)

## 2020-04-05 LAB — APTT
aPTT: 49 seconds — ABNORMAL HIGH (ref 24–36)
aPTT: 86 seconds — ABNORMAL HIGH (ref 24–36)
aPTT: 62 seconds — ABNORMAL HIGH (ref 24–36)

## 2020-04-05 LAB — LACTIC ACID, PLASMA: Lactic Acid, Venous: 1.1 mmol/L (ref 0.5–1.9)

## 2020-04-05 LAB — HEPARIN LEVEL (UNFRACTIONATED): Heparin Unfractionated: 0.6 IU/mL (ref 0.30–0.70)

## 2020-04-05 MED ORDER — HYDROMORPHONE HCL 1 MG/ML IJ SOLN
1.0000 mg | INTRAMUSCULAR | Status: DC | PRN
  Administered 2020-04-05: 1 mg via INTRAVENOUS
  Filled 2020-04-05: qty 1

## 2020-04-05 MED ORDER — ONDANSETRON HCL 4 MG/2ML IJ SOLN: 4.0000 mg | Freq: Four times a day (QID) | INTRAMUSCULAR | Status: DC | PRN

## 2020-04-05 MED ORDER — HEPARIN (PORCINE) 25000 UT/250ML-% IV SOLN
1500.0000 [IU]/h | INTRAVENOUS | Status: DC
  Administered 2020-04-06: 1550 [IU]/h via INTRAVENOUS
  Filled 2020-04-05 (×2): qty 250

## 2020-04-05 MED ORDER — BENZONATATE 100 MG PO CAPS
100.0000 mg | ORAL_CAPSULE | Freq: Three times a day (TID) | ORAL | Status: DC
  Administered 2020-04-05 – 2020-04-06 (×4): 100 mg via ORAL
  Filled 2020-04-05 (×4): qty 1

## 2020-04-05 MED ORDER — HYDROMORPHONE HCL 1 MG/ML IJ SOLN
0.5000 mg | INTRAMUSCULAR | Status: DC | PRN
Start: 2020-04-05 — End: 2020-04-06
  Administered 2020-04-05 – 2020-04-06 (×6): 0.5 mg via INTRAVENOUS
  Filled 2020-04-05 (×6): qty 0.5

## 2020-04-05 NOTE — Progress Notes (Signed)
[place] for heparin Indication: hx pulmonary embolus and DVT (home Eliquis on hold)  Allergies  Allergen Reactions  . Azithromycin Anaphylaxis  . Peanut-Containing Drug Products Other (See Comments)    Unknown ; treenuts, shell fish Unknown reaction, took allergy test  . Iodinated Diagnostic Agents Hives and Itching    02/04/2020 Patient stated has history of itching and hives with CT IV Iodine contrast, premedicated with Benadryl 25 mg IV prior to CT IV Iodine scan today.Post CT scan patient reported itching, Benadryl 25 mg IV given, symptoms resolved, NP recommends for patient to get premedicated with Benadryl 50 mg IV prior to future CT IV Iodine scans.   . Other Other (See Comments)    Moderna covid vaccine -Syncope   . Amoxicillin Swelling    Hand swelling   . Fish Allergy Other (See Comments)    Unknown Allergy test   . Gluten Meal Other (See Comments)    Celiac disease  . Hydromet [Hydrocodone-Homatropine] Nausea And Vomiting  . Ibuprofen Nausea And Vomiting  . Pantoprazole Other (See Comments)    Stomach pain   Patient Measurements: Height: 5' 2"  (157.5 cm) Weight: 99.8 kg (220 lb) IBW/kg (Calculated) : 50.1 Heparin Dosing Weight: 74 kg  Vital Signs: Temp: 97.9 F (36.6 C) (03/15 0354) Temp Source: Oral (03/15 0354) BP: 113/73 (03/15 0354) Pulse Rate: 89 (03/15 0354)  Labs: Recent Labs    04/04/20 0500 04/04/20 0630 04/04/20 1526 04/05/20 0340 04/05/20 1116  HGB 11.2*  --   --  10.2*  --   HCT 35.6*  --   --  32.8*  --   PLT 465*  --   --  408*  --   APTT  --   --  30 49* 86*  HEPARINUNFRC  --   --  0.32 0.60  --   CREATININE 0.73  --   --  0.80  --   TROPONINIHS 5 5  --   --   --     Estimated Creatinine Clearance: 117.8 mL/min (by C-G formula based on SCr of 0.8 mg/dL).   Medications:  - on Eliquis 5 mg bid PTA (last dose taken on 3/13 at 2100)  Assessment: Patient's a 25 y.o with hx lupus,  DVT/PE (in  Jan 2022) and COVID-19 in February presented to the ED on 3/14 with c/o of CP, SOB and cough.  Pharmacy has been consulted to start heparin drip while patient is NPO and in case invasive intervention is needed.  - c/o intermittent hemoptysis on adm - 3/14 chest CTA: neg for PE Baseline Heparin level elevated at 0.32 due to interaction with Eliquis.  Will monitor heparin using aptt until Eliquis is eliminated and heparin level/aptt correlate.   04/05/2020: -Initial aptt 49 sec- subtherapeutic on IV heparin at 1200 units/hr > rate increase to 1500 units/hr -Heparin level 0.6- falsely elevated due to recent Eliquis use -CBC: Hg slightly low at 10.2, pltc mildly elevated at 408 - No bleeding or infusion related issues reported by RN - Follow up aPTT at 11:16 = 86 sec  Goal of Therapy:  Heparin level 0.3-0.7 units/ml aPTT 66-102 seconds Monitor platelets by anticoagulation protocol: Yes   Plan:  Continue Heparin drip at 1500 units/hr Follow up aPTT at 8pm Daily heparin level, CBC ordered, no daily aPTT at this point monitor for bleeding  Minda Ditto PharmD 04/05/2020,12:15 PM

## 2020-04-05 NOTE — Progress Notes (Signed)
[place] for heparin Indication: hx pulmonary embolus and DVT (home Eliquis on hold)  Allergies  Allergen Reactions  . Azithromycin Anaphylaxis  . Peanut-Containing Drug Products Other (See Comments)    Unknown ; treenuts, shell fish Unknown reaction, took allergy test  . Iodinated Diagnostic Agents Hives and Itching    02/04/2020 Patient stated has history of itching and hives with CT IV Iodine contrast, premedicated with Benadryl 25 mg IV prior to CT IV Iodine scan today.Post CT scan patient reported itching, Benadryl 25 mg IV given, symptoms resolved, NP recommends for patient to get premedicated with Benadryl 50 mg IV prior to future CT IV Iodine scans.   . Other Other (See Comments)    Moderna covid vaccine -Syncope   . Amoxicillin Swelling    Hand swelling   . Fish Allergy Other (See Comments)    Unknown Allergy test   . Gluten Meal Other (See Comments)    Celiac disease  . Hydromet [Hydrocodone-Homatropine] Nausea And Vomiting  . Ibuprofen Nausea And Vomiting  . Pantoprazole Other (See Comments)    Stomach pain    Patient Measurements: Height: 5' 2"  (157.5 cm) Weight: 99.8 kg (220 lb) IBW/kg (Calculated) : 50.1 Heparin Dosing Weight: 74 kg  Vital Signs: Temp: 97.9 F (36.6 C) (03/15 0354) Temp Source: Oral (03/15 0354) BP: 113/73 (03/15 0354) Pulse Rate: 89 (03/15 0354)  Labs: Recent Labs    04/04/20 0500 04/04/20 0630 04/04/20 1526 04/05/20 0340  HGB 11.2*  --   --  10.2*  HCT 35.6*  --   --  32.8*  PLT 465*  --   --  408*  APTT  --   --  30 49*  HEPARINUNFRC  --   --  0.32 0.60  CREATININE 0.73  --   --  0.80  TROPONINIHS 5 5  --   --     Estimated Creatinine Clearance: 117.8 mL/min (by C-G formula based on SCr of 0.8 mg/dL).   Medications:  - on Eliquis 5 mg bid PTA (last dose taken on 3/13 at 2100)  Assessment: Patient's a 25 y.o with hx lupus,  DVT/PE (in Jan 2022) and COVID-19 in February presented to  the ED on 3/14 with c/o of CP, SOB and cough.  Pharmacy has been consulted to start heparin drip while patient is NPO and in case invasive intervention is needed.  - c/o intermittent hemoptysis on adm - 3/14 chest CTA: neg for PE Baseline Heparin level elevated at 0.32 due to interaction with Eliquis.  Will monitor heparin using aptt until Eliquis is eliminated and heparin level/aptt correlate.   04/05/2020: -Initial aptt 49 sec- subtherapeutic on IV heparin at 1200 units/hr -Heparin level 0.6- falsely elevated due to recent Eliquis use -CBC: Hg slightly low at 10.2, pltc mildly elevated at 408 - No bleeding or infusion related issues reported by RN  Goal of Therapy:  Heparin level 0.3-0.7 units/ml aPTT 66-102 seconds Monitor platelets by anticoagulation protocol: Yes   Plan:  - Increase heparin drip to 1500 units/hr - check 6 hr aPTT -Daily heparin level, CBC & aptt while on heparin - monitor for bleeding  Netta Cedars PharmD 04/05/2020,5:01 AM

## 2020-04-05 NOTE — Progress Notes (Signed)
  Echocardiogram 2D Echocardiogram has been performed.  [name] 04/05/2020, 3:45 PM

## 2020-04-05 NOTE — Progress Notes (Signed)
Patient complained of headache and stated that pain was 10 out of 10. Dilaudid was administered at 0923 and when reassessed an hour later pain was at a 7 out of 10. When attempting to educate about the importance of inhaler use for asthma and cough patient ignored and wanted to know the scheduled of her Dilaudid. Notified Dr. Tana Coast, see new orders. Will continue to monitor.

## 2020-04-05 NOTE — Progress Notes (Addendum)
[facility]                                                                              Patient Demographics  [name], is a 25 y.o. female, DOB - March 25, 1995, XNA:[id number]  Admit date - 04/04/2020   Admitting Physician Harold Hedge, MD  Outpatient Primary MD for the patient is [facility] [place]  Outpatient specialists:   LOS - 0  days   Medical records reviewed and are as summarized below:    Chief Complaint  Patient presents with  . 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  Also reported intermittent small streaks of hemoptysis, fever 104.5 F and inability to keep down any food, myalgias.  She did not miss any dose of eliquis.  Diagnosed with COVID-19 on 02/24/2020 and was seen in the ED on 2/25, concerning for pneumonia and discharged with doxycycline which she has completed. Patient was seen by Dr. Chase Caller on 3/1 and rheumatology in 3/2.  Patient was recommended to continue doxycycline but did not have any improvement and was started on prednisone taper however had hyperglycemia and no significant improvement in symptoms.  For the past month her cough has persisted. Regarding her abdominal pain, per [facility] Every[facility] Colonoscopy 12/05/2018, normal, BBPS 9. Random biopsies negative for microscopic colitis. EGD 2019 per pt report with previous gastroenterologist, esophagitis and hiatal hernia noted per pt report.  Assessment & Plan    Principal Problem:   Shortness of breath of unclear etiology - CTA chest is  unremarkable, cleared with resolution of groundglass opacities. -Outpatient failure to doxycycline and prednisone taper -Obtain 2D echo for further work-up. -Pulmonology following, continue nebs, no role of bronchoscopy.  Active Problems:  Chronic nausea, abdominal pain, gastritis/GERD, history of celiac disease -patient is followed by outpatient GI Dr. Alan Ripper at [facility].  She had multiple EGDs, colonoscopy and CT scan.  -GI following, patient had reported gastric erosions and esophagitis with prior EGD in the last 6 months.  Was not able to tolerate PPI and H2 blockers, was recently prescribed Carafate, Colestid, dicyclomine and an antiulcer medication. -Currently complaining of nausea, bloating, no significant abdominal pain, states never had gastric emptying study done, will rule out gastroparesis -Continue gluten-free diet -Continue Protonix  History of recurrent DVT, PE -Currently on IV heparin drip, will transition back to Eliquis, if no plans for EGD.  Per pulmonology, no role for bronchoscopy.  Lactic acidosis -Likely due to dehydration from nausea vomiting and poor p.o. intake.  Lactic acid 2.6, improved to 1.1  Recent COVID-19  -Currently no hypoxia, no wheezing, hold off on steroids possibly causing gastritis and GERD -Has chronic cough, placed on Tessalon Perles -Continue Brovana, albuterol inhaler, yupelri nebs  Anxiety, depression -  Continue Zoloft, continue Xanax as needed    SLE (systemic lupus erythematosus related syndrome) ([place]) -Does not appear to be in acute flare, continue Plaquenil    Obesity, Class III, BMI 40-49.9 (morbid obesity) ([place]) Estimated body mass index is 40.24 kg/m as calculated from the following:   Height as of this encounter: 5' 2"  (1.575 m).   Weight as of this encounter: 99.8 kg.    Code Status: Full CODE STATUS DVT Prophylaxis: Currently on IV heparin drip   Level of Care: Level of care: Telemetry Family Communication:  Discussed all imaging results, lab results, explained to the patient    Disposition Plan:     Status is: Observation  The patient remains OBS appropriate and will d/c before 2 midnights.  Dispo: The patient is from: Home              Anticipated d/c is to: Home              Patient currently is not medically stable to d/c.  Still has nausea, abdominal bloating, pain, headache, shortness of breath   Difficult to place patient No      Time Spent in minutes   35 minutes  Procedures:  None  Consultants:   Gastroenterology Pulmonology  Antimicrobials:   Anti-infectives (From admission, onward)   Start     Dose/Rate Route Frequency Ordered Stop   04/04/20 1400  hydroxychloroquine (PLAQUENIL) tablet 400 mg        400 mg Oral Daily 04/04/20 1221           Medications  Scheduled Meds: . arformoterol  15 mcg Nebulization BID  . benzonatate  100 mg Oral TID  . budesonide (PULMICORT) nebulizer solution  0.5 mg Nebulization BID  . hydroxychloroquine  400 mg Oral Daily  . pantoprazole  40 mg Oral Q0600  . revefenacin  175 mcg Nebulization Daily  . sertraline  200 mg Oral QHS  . sodium chloride flush  3 mL Intravenous Q12H  . sucralfate  1 g Oral BID   Continuous Infusions: . heparin 1,500 Units/hr (04/05/20 0522)   PRN Meds:.albuterol, ALPRAZolam, HYDROmorphone (DILAUDID) injection, ondansetron (ZOFRAN) IV, zolpidem      Subjective:   [name] was seen and examined today.  Multiple complaints, headache, abdominal pain, nausea, shortness of breath.  Somewhat anxious.  States morphine is not helping with the pain.  No acute events overnight.  No fevers or chills.    Objective:   Vitals:   04/04/20 1725 04/04/20 2117 04/05/20 0037 04/05/20 0354  BP: 130/80 (!) 124/98 123/77 113/73  Pulse: 93 99 94 89  Resp:  18 18 18   Temp: 98.3 F (36.8 C) 98.4 F (36.9 C) 97.9 F (36.6 C) 97.9 F (36.6 C)  TempSrc: Oral Oral Oral Oral  SpO2: 100% 98% 98% 100%  Weight:       Height:        Intake/Output Summary (Last 24 hours) at 04/05/2020 1014 Last data filed at 04/05/2020 0554 Gross per 24 hour  Intake 1365.75 ml  Output -  Net 1365.75 ml     Wt Readings from Last 3 Encounters:  04/04/20 99.8 kg  03/23/20 116.9 kg  03/22/20 94.7 kg     Exam  General: Alert and oriented x 3, appears anxious and uncomfortable  Cardiovascular: S1 S2 auscultated, no murmurs, RRR  Respiratory: Decreased breath sound at the bases, no wheezing  Gastrointestinal: Soft, epigastric TTP, nondistended, + bowel sounds  Ext: no pedal edema  bilaterally  Neuro: no new deficits  Musculoskeletal: No digital cyanosis, clubbing  Skin: No rashes  Psych: anxious, alert and oriented x3    Data Reviewed:  I have personally reviewed following labs and imaging studies  Micro Results Recent Results (from the past 240 hour(s))  Resp Panel by RT-PCR (Flu A&B, Covid) Nasopharyngeal Swab     Status: None   Collection Time: 04/04/20 12:07 PM   Specimen: Nasopharyngeal Swab; Nasopharyngeal(NP) swabs in vial transport medium  Result Value Ref Range Status   SARS Coronavirus 2 by RT PCR NEGATIVE NEGATIVE Final    Comment: (NOTE) SARS-CoV-2 target nucleic acids are NOT DETECTED.  The SARS-CoV-2 RNA is generally detectable in upper respiratory specimens during the acute phase of infection. The lowest concentration of SARS-CoV-2 viral copies this assay can detect is 138 copies/mL. A negative result does not preclude SARS-Cov-2 infection and should not be used as the sole basis for treatment or other patient management decisions. A negative result may occur with  improper specimen collection/handling, submission of specimen other than nasopharyngeal swab, presence of viral mutation(s) within the areas targeted by this assay, and inadequate number of viral copies(<138 copies/mL). A negative result must be combined with clinical observations, patient history, and  epidemiological information. The expected result is Negative.  Fact Sheet for Patients:  EntrepreneurPulse.com.au  Fact Sheet for Healthcare Providers:  IncredibleEmployment.be  This test is no t yet approved or cleared by the [place] FDA and  has been authorized for detection and/or diagnosis of SARS-CoV-2 by FDA under an Emergency Use Authorization (EUA). This EUA will remain  in effect (meaning this test can be used) for the duration of the COVID-19 declaration under Section 564(b)(1) of the Act, 21 U.S.C.section 360bbb-3(b)(1), unless the authorization is terminated  or revoked sooner.       Influenza A by PCR NEGATIVE NEGATIVE Final   Influenza B by PCR NEGATIVE NEGATIVE Final    Comment: (NOTE) The Xpert Xpress SARS-CoV-2/FLU/RSV plus assay is intended as an aid in the diagnosis of influenza from Nasopharyngeal swab specimens and should not be used as a sole basis for treatment. Nasal washings and aspirates are unacceptable for Xpert Xpress SARS-CoV-2/FLU/RSV testing.  Fact Sheet for Patients: EntrepreneurPulse.com.au  Fact Sheet for Healthcare Providers: IncredibleEmployment.be  This test is not yet approved or cleared by the [place] FDA and has been authorized for detection and/or diagnosis of SARS-CoV-2 by FDA under an Emergency Use Authorization (EUA). This EUA will remain in effect (meaning this test can be used) for the duration of the COVID-19 declaration under Section 564(b)(1) of the Act, 21 U.S.C. section 360bbb-3(b)(1), unless the authorization is terminated or revoked.  Performed at [facility], [place] [address], [place], [place] [address]     Radiology Reports DG Chest 2 View  Result Date: 04/04/2020 CLINICAL DATA:  Shortness of breath.  COVID induced pneumonia. EXAM: CHEST - 2 VIEW COMPARISON:  Chest x-ray 03/01/2020, CT chest 03/18/2020 FINDINGS:  The heart size and mediastinal contours are within normal limits. No focal consolidation. No pulmonary edema. No pleural effusion. No pneumothorax. No acute osseous abnormality. IMPRESSION: No active cardiopulmonary disease. Electronically Signed   By: Iven Finn M.D.   On: 04/04/2020 05:23   CT Angio Chest PE W and/or Wo Contrast  Result Date: 04/04/2020 CLINICAL DATA:  Chest pain.  Abdomen pain. EXAM: CT ANGIOGRAPHY CHEST CT ABDOMEN AND PELVIS WITH CONTRAST TECHNIQUE: Multidetector CT imaging of the chest was performed using the standard protocol  during bolus administration of intravenous contrast. Multiplanar CT image reconstructions and MIPs were obtained to evaluate the vascular anatomy. Multidetector CT imaging of the abdomen and pelvis was performed using the standard protocol during bolus administration of intravenous contrast. CONTRAST:  141m OMNIPAQUE IOHEXOL 350 MG/ML SOLN COMPARISON:  None. CT abdomen and pelvis November 13, 2018 1; chest CT March 18, 2020 FINDINGS: CTA CHEST FINDINGS Cardiovascular: There is no evident pulmonary embolus. No thoracic aortic aneurysm or dissection. Visualized great vessels appear normal. No thoracic aortic aneurysm or dissection. Mediastinum/Nodes: Thyroid appears normal. Small amount of thymic tissue is normal for age. No evident thoracic adenopathy. No esophageal lesions evident. Lungs/Pleura: Interval clearing of infiltrate compared to prior CT. Lungs now clear. No pleural effusion. No pneumothorax. Trachea and major bronchial structures appear patent. Musculoskeletal: No blastic or lytic bone lesions. Review of the MIP images confirms the above findings. CT ABDOMEN and PELVIS FINDINGS Hepatobiliary: There is a 5 mm cyst in the anterior segment right lobe of the liver. No other focal liver lesions are evident. Gallbladder is absent. There is no appreciable biliary dilatation. Pancreas: There is no pancreatic mass or inflammatory focus. Spleen: No splenic  lesions are evident. Adrenals/Urinary Tract: Adrenals bilaterally appear normal. There is no appreciable renal mass or hydronephrosis on either side. There is no evident renal or ureteral calculus on either side. Urinary bladder is midline with wall thickness within normal limits. Stomach/Bowel: There is no appreciable bowel wall or mesenteric thickening. Moderate stool noted in the colon. No bowel obstruction. Terminal ileum appears normal. No free air or portal venous air. Appendix absent. No periappendiceal region inflammation. Vascular/Lymphatic: No abdominal aortic aneurysm. No arterial vascular lesions are evident. Major venous structures appear patent. There is no evident adenopathy in the abdomen or pelvis. Reproductive: The uterus is anteverted.  No evident adnexal mass. Other: No abscess or ascites in the abdomen or pelvis. Musculoskeletal: No blastic or lytic bone lesions. No abdominal wall or intramuscular lesions. Review of the MIP images confirms the above findings. IMPRESSION: CT angiogram chest: 1. No demonstrable pulmonary embolus. No thoracic aortic aneurysm or dissection. 2.  Lungs now clear. 3.  No adenopathy. CT abdomen and pelvis: 1. A cause for patient's symptoms has not been established with this study. 2. No bowel wall thickening or bowel obstruction. No abscess in the abdomen or pelvis. Appendix absent. 3.  Gallbladder absent. 4. No evident renal or ureteral calculus. No hydronephrosis. Urinary bladder wall thickness normal. Electronically Signed   By: WLowella GripIII M.D.   On: 04/04/2020 10:17   CT Angio Chest PE W/Cm &/Or Wo Cm  Result Date: 03/18/2020 CLINICAL DATA:  Worsening shortness of breath and chest pain today. History pulmonary embolism on chronic anticoagulation. Not recently taking anticoagulation medications. EXAM: CT ANGIOGRAPHY CHEST WITH CONTRAST TECHNIQUE: Multidetector CT imaging of the chest was performed using the standard protocol during bolus administration  of intravenous contrast. Multiplanar CT image reconstructions and MIPs were obtained to evaluate the vascular anatomy. CONTRAST:  1028mOMNIPAQUE IOHEXOL 350 MG/ML SOLN COMPARISON:  Chest radiographs 03/01/2020. Abdominopelvic CT 11/13/2019. No prior chest CT available. FINDINGS: Cardiovascular: The pulmonary arteries are adequately but not optimally opacified with contrast to the level of the subsegmental branches. There is no evidence of acute pulmonary embolism. No evidence of acute systemic arterial abnormality. The heart size is normal. There is no pericardial effusion. Mediastinum/Nodes: There are no enlarged mediastinal, hilar or axillary lymph nodes.Prevascular soft tissue most consistent with residual thymic tissue. The thyroid  gland, trachea and esophagus demonstrate no significant findings. Lungs/Pleura: No pleural effusion or pneumothorax. There are patchy airspace opacities in both lungs. Dependent components in both lower lobes likely represent atelectasis. There is a more diffuse component in the left upper lobe which could reflect infection. There is a smaller dependent component posteriorly in the right upper lobe. No consolidation or suspicious pulmonary nodularity. Upper abdomen: No significant findings are seen within the visualized upper abdomen. There is a probable cyst in the dome of the liver which appears unchanged. Previous cholecystectomy. Musculoskeletal/Chest wall: There is no chest wall mass or suspicious osseous finding. Review of the MIP images confirms the above findings. IMPRESSION: 1. No evidence of acute pulmonary embolism or other acute vascular findings. 2. Patchy airspace opacities in both lungs with a more diffuse component in the left upper lobe which could reflect infection. Dependent components in both lower lobes likely represent atelectasis. Recommend radiographic follow-up. Electronically Signed   By: Richardean Sale M.D.   On: 03/18/2020 19:14   CT ABDOMEN PELVIS W  CONTRAST  Result Date: 04/04/2020 CLINICAL DATA:  Chest pain.  Abdomen pain. EXAM: CT ANGIOGRAPHY CHEST CT ABDOMEN AND PELVIS WITH CONTRAST TECHNIQUE: Multidetector CT imaging of the chest was performed using the standard protocol during bolus administration of intravenous contrast. Multiplanar CT image reconstructions and MIPs were obtained to evaluate the vascular anatomy. Multidetector CT imaging of the abdomen and pelvis was performed using the standard protocol during bolus administration of intravenous contrast. CONTRAST:  111m OMNIPAQUE IOHEXOL 350 MG/ML SOLN COMPARISON:  None. CT abdomen and pelvis November 13, 2018 1; chest CT March 18, 2020 FINDINGS: CTA CHEST FINDINGS Cardiovascular: There is no evident pulmonary embolus. No thoracic aortic aneurysm or dissection. Visualized great vessels appear normal. No thoracic aortic aneurysm or dissection. Mediastinum/Nodes: Thyroid appears normal. Small amount of thymic tissue is normal for age. No evident thoracic adenopathy. No esophageal lesions evident. Lungs/Pleura: Interval clearing of infiltrate compared to prior CT. Lungs now clear. No pleural effusion. No pneumothorax. Trachea and major bronchial structures appear patent. Musculoskeletal: No blastic or lytic bone lesions. Review of the MIP images confirms the above findings. CT ABDOMEN and PELVIS FINDINGS Hepatobiliary: There is a 5 mm cyst in the anterior segment right lobe of the liver. No other focal liver lesions are evident. Gallbladder is absent. There is no appreciable biliary dilatation. Pancreas: There is no pancreatic mass or inflammatory focus. Spleen: No splenic lesions are evident. Adrenals/Urinary Tract: Adrenals bilaterally appear normal. There is no appreciable renal mass or hydronephrosis on either side. There is no evident renal or ureteral calculus on either side. Urinary bladder is midline with wall thickness within normal limits. Stomach/Bowel: There is no appreciable bowel wall  or mesenteric thickening. Moderate stool noted in the colon. No bowel obstruction. Terminal ileum appears normal. No free air or portal venous air. Appendix absent. No periappendiceal region inflammation. Vascular/Lymphatic: No abdominal aortic aneurysm. No arterial vascular lesions are evident. Major venous structures appear patent. There is no evident adenopathy in the abdomen or pelvis. Reproductive: The uterus is anteverted.  No evident adnexal mass. Other: No abscess or ascites in the abdomen or pelvis. Musculoskeletal: No blastic or lytic bone lesions. No abdominal wall or intramuscular lesions. Review of the MIP images confirms the above findings. IMPRESSION: CT angiogram chest: 1. No demonstrable pulmonary embolus. No thoracic aortic aneurysm or dissection. 2.  Lungs now clear. 3.  No adenopathy. CT abdomen and pelvis: 1. A cause for patient's symptoms has not been  established with this study. 2. No bowel wall thickening or bowel obstruction. No abscess in the abdomen or pelvis. Appendix absent. 3.  Gallbladder absent. 4. No evident renal or ureteral calculus. No hydronephrosis. Urinary bladder wall thickness normal. Electronically Signed   By: Lowella Grip III M.D.   On: 04/04/2020 10:17    Lab Data:  CBC: Recent Labs  Lab 04/04/20 0500 04/05/20 0340  WBC 10.4 11.1*  HGB 11.2* 10.2*  HCT 35.6* 32.8*  MCV 71.9* 73.9*  PLT 465* 734*   Basic Metabolic Panel: Recent Labs  Lab 04/04/20 0500 04/05/20 0340  NA 138 138  K 3.9 3.5  CL 108 106  CO2 21* 21*  GLUCOSE 114* 100*  BUN 11 9  CREATININE 0.73 0.80  CALCIUM 9.4 8.8*   GFR: Estimated Creatinine Clearance: 117.8 mL/min (by C-G formula based on SCr of 0.8 mg/dL). Liver Function Tests: Recent Labs  Lab 04/04/20 0500 04/05/20 0340  AST 19 18  ALT 13 13  ALKPHOS 65 60  BILITOT 0.2* 0.5  PROT 7.1 6.6  ALBUMIN 3.6 3.6   Recent Labs  Lab 04/04/20 0500  LIPASE 39   No results for input(s): AMMONIA in the last 168  hours. Coagulation Profile: No results for input(s): INR, PROTIME in the last 168 hours. Cardiac Enzymes: No results for input(s): CKTOTAL, CKMB, CKMBINDEX, TROPONINI in the last 168 hours. BNP (last 3 results) No results for input(s): PROBNP in the last 8760 hours. HbA1C: No results for input(s): HGBA1C in the last 72 hours. CBG: No results for input(s): GLUCAP in the last 168 hours. Lipid Profile: No results for input(s): CHOL, HDL, LDLCALC, TRIG, CHOLHDL, LDLDIRECT in the last 72 hours. Thyroid Function Tests: Recent Labs    04/04/20 0500  TSH 1.235   Anemia Panel: No results for input(s): VITAMINB12, FOLATE, FERRITIN, TIBC, IRON, RETICCTPCT in the last 72 hours. Urine analysis:    Component Value Date/Time   COLORURINE YELLOW 04/04/2020 0808   APPEARANCEUR CLEAR 04/04/2020 0808   LABSPEC 1.017 04/04/2020 0808   PHURINE 5.0 04/04/2020 0808   GLUCOSEU NEGATIVE 04/04/2020 0808   HGBUR NEGATIVE 04/04/2020 0808   BILIRUBINUR NEGATIVE 04/04/2020 0808   KETONESUR NEGATIVE 04/04/2020 0808   PROTEINUR NEGATIVE 04/04/2020 0808   UROBILINOGEN 0.2 03/25/2019 1451   NITRITE NEGATIVE 04/04/2020 0808   LEUKOCYTESUR NEGATIVE 04/04/2020 1937     Ripudeep Rai M.D. [facility] 04/05/2020, 10:14 AM  Available via Epic secure chat 7am-7pm After 7 pm, please refer to night coverage provider listed on amion.

## 2020-04-05 NOTE — Progress Notes (Signed)
Progress Note   Subjective  Chief Complaint: Nausea, vomiting, chronic abdominal pain  This morning, the patient has a "terrible" headache for which the nurses giving her Dilaudid.  She tells me she had an increase in her abdominal pain overnight as well which is mostly around her umbilicus.  When asked about what medications of helped her in the past she tells me occasionally she gets relief with Carafate or Dicyclomine.  Also started with increase in diarrhea again last night for her.   Objective   Vital signs in last 24 hours: Temp:  [97.6 F (36.4 C)-98.4 F (36.9 C)] 97.9 F (36.6 C) (03/15 0354) Pulse Rate:  [89-122] 89 (03/15 0354) Resp:  [16-23] 18 (03/15 0354) BP: (113-150)/(69-98) 113/73 (03/15 0354) SpO2:  [97 %-100 %] 100 % (03/15 0354) Last BM Date: 04/04/20 General:    Obese AA female in NAD Heart:  Regular rate and rhythm; no murmurs Lungs: Respirations even and unlabored, lungs CTA bilaterally Abdomen:  Soft, marked ttp around umbilicus and nondistended. Normal bowel sounds. Extremities:  Without edema. Neurologic:  Alert and oriented,  grossly normal neurologically. Psych:  Cooperative. Normal mood and affect.  Intake/Output from previous day: 03/14 0701 - 03/15 0700 In: 1365.8 [P.O.:240; I.V.:125.8; IV Piggyback:1000] Out: -   Lab Results: Recent Labs    04/04/20 0500 04/05/20 0340  WBC 10.4 11.1*  HGB 11.2* 10.2*  HCT 35.6* 32.8*  PLT 465* 408*   BMET Recent Labs    04/04/20 0500 04/05/20 0340  NA 138 138  K 3.9 3.5  CL 108 106  CO2 21* 21*  GLUCOSE 114* 100*  BUN 11 9  CREATININE 0.73 0.80  CALCIUM 9.4 8.8*   LFT Recent Labs    04/05/20 0340  PROT 6.6  ALBUMIN 3.6  AST 18  ALT 13  ALKPHOS 60  BILITOT 0.5   Studies/Results: DG Chest 2 View  Result Date: 04/04/2020 CLINICAL DATA:  Shortness of breath.  COVID induced pneumonia. EXAM: CHEST - 2 VIEW COMPARISON:  Chest x-ray 03/01/2020, CT chest 03/18/2020 FINDINGS: The heart  size and mediastinal contours are within normal limits. No focal consolidation. No pulmonary edema. No pleural effusion. No pneumothorax. No acute osseous abnormality. IMPRESSION: No active cardiopulmonary disease. Electronically Signed   By: Iven Finn M.D.   On: 04/04/2020 05:23   CT Angio Chest PE W and/or Wo Contrast  Result Date: 04/04/2020 CLINICAL DATA:  Chest pain.  Abdomen pain. EXAM: CT ANGIOGRAPHY CHEST CT ABDOMEN AND PELVIS WITH CONTRAST TECHNIQUE: Multidetector CT imaging of the chest was performed using the standard protocol during bolus administration of intravenous contrast. Multiplanar CT image reconstructions and MIPs were obtained to evaluate the vascular anatomy. Multidetector CT imaging of the abdomen and pelvis was performed using the standard protocol during bolus administration of intravenous contrast. CONTRAST:  14m OMNIPAQUE IOHEXOL 350 MG/ML SOLN COMPARISON:  None. CT abdomen and pelvis November 13, 2018 1; chest CT March 18, 2020 FINDINGS: CTA CHEST FINDINGS Cardiovascular: There is no evident pulmonary embolus. No thoracic aortic aneurysm or dissection. Visualized great vessels appear normal. No thoracic aortic aneurysm or dissection. Mediastinum/Nodes: Thyroid appears normal. Small amount of thymic tissue is normal for age. No evident thoracic adenopathy. No esophageal lesions evident. Lungs/Pleura: Interval clearing of infiltrate compared to prior CT. Lungs now clear. No pleural effusion. No pneumothorax. Trachea and major bronchial structures appear patent. Musculoskeletal: No blastic or lytic bone lesions. Review of the MIP images confirms the above findings. CT ABDOMEN and  PELVIS FINDINGS Hepatobiliary: There is a 5 mm cyst in the anterior segment right lobe of the liver. No other focal liver lesions are evident. Gallbladder is absent. There is no appreciable biliary dilatation. Pancreas: There is no pancreatic mass or inflammatory focus. Spleen: No splenic lesions are  evident. Adrenals/Urinary Tract: Adrenals bilaterally appear normal. There is no appreciable renal mass or hydronephrosis on either side. There is no evident renal or ureteral calculus on either side. Urinary bladder is midline with wall thickness within normal limits. Stomach/Bowel: There is no appreciable bowel wall or mesenteric thickening. Moderate stool noted in the colon. No bowel obstruction. Terminal ileum appears normal. No free air or portal venous air. Appendix absent. No periappendiceal region inflammation. Vascular/Lymphatic: No abdominal aortic aneurysm. No arterial vascular lesions are evident. Major venous structures appear patent. There is no evident adenopathy in the abdomen or pelvis. Reproductive: The uterus is anteverted.  No evident adnexal mass. Other: No abscess or ascites in the abdomen or pelvis. Musculoskeletal: No blastic or lytic bone lesions. No abdominal wall or intramuscular lesions. Review of the MIP images confirms the above findings. IMPRESSION: CT angiogram chest: 1. No demonstrable pulmonary embolus. No thoracic aortic aneurysm or dissection. 2.  Lungs now clear. 3.  No adenopathy. CT abdomen and pelvis: 1. A cause for patient's symptoms has not been established with this study. 2. No bowel wall thickening or bowel obstruction. No abscess in the abdomen or pelvis. Appendix absent. 3.  Gallbladder absent. 4. No evident renal or ureteral calculus. No hydronephrosis. Urinary bladder wall thickness normal. Electronically Signed   By: Lowella Grip III M.D.   On: 04/04/2020 10:17   CT ABDOMEN PELVIS W CONTRAST  Result Date: 04/04/2020 CLINICAL DATA:  Chest pain.  Abdomen pain. EXAM: CT ANGIOGRAPHY CHEST CT ABDOMEN AND PELVIS WITH CONTRAST TECHNIQUE: Multidetector CT imaging of the chest was performed using the standard protocol during bolus administration of intravenous contrast. Multiplanar CT image reconstructions and MIPs were obtained to evaluate the vascular anatomy.  Multidetector CT imaging of the abdomen and pelvis was performed using the standard protocol during bolus administration of intravenous contrast. CONTRAST:  12m OMNIPAQUE IOHEXOL 350 MG/ML SOLN COMPARISON:  None. CT abdomen and pelvis November 13, 2018 1; chest CT March 18, 2020 FINDINGS: CTA CHEST FINDINGS Cardiovascular: There is no evident pulmonary embolus. No thoracic aortic aneurysm or dissection. Visualized great vessels appear normal. No thoracic aortic aneurysm or dissection. Mediastinum/Nodes: Thyroid appears normal. Small amount of thymic tissue is normal for age. No evident thoracic adenopathy. No esophageal lesions evident. Lungs/Pleura: Interval clearing of infiltrate compared to prior CT. Lungs now clear. No pleural effusion. No pneumothorax. Trachea and major bronchial structures appear patent. Musculoskeletal: No blastic or lytic bone lesions. Review of the MIP images confirms the above findings. CT ABDOMEN and PELVIS FINDINGS Hepatobiliary: There is a 5 mm cyst in the anterior segment right lobe of the liver. No other focal liver lesions are evident. Gallbladder is absent. There is no appreciable biliary dilatation. Pancreas: There is no pancreatic mass or inflammatory focus. Spleen: No splenic lesions are evident. Adrenals/Urinary Tract: Adrenals bilaterally appear normal. There is no appreciable renal mass or hydronephrosis on either side. There is no evident renal or ureteral calculus on either side. Urinary bladder is midline with wall thickness within normal limits. Stomach/Bowel: There is no appreciable bowel wall or mesenteric thickening. Moderate stool noted in the colon. No bowel obstruction. Terminal ileum appears normal. No free air or portal venous air. Appendix absent.  No periappendiceal region inflammation. Vascular/Lymphatic: No abdominal aortic aneurysm. No arterial vascular lesions are evident. Major venous structures appear patent. There is no evident adenopathy in the abdomen  or pelvis. Reproductive: The uterus is anteverted.  No evident adnexal mass. Other: No abscess or ascites in the abdomen or pelvis. Musculoskeletal: No blastic or lytic bone lesions. No abdominal wall or intramuscular lesions. Review of the MIP images confirms the above findings. IMPRESSION: CT angiogram chest: 1. No demonstrable pulmonary embolus. No thoracic aortic aneurysm or dissection. 2.  Lungs now clear. 3.  No adenopathy. CT abdomen and pelvis: 1. A cause for patient's symptoms has not been established with this study. 2. No bowel wall thickening or bowel obstruction. No abscess in the abdomen or pelvis. Appendix absent. 3.  Gallbladder absent. 4. No evident renal or ureteral calculus. No hydronephrosis. Urinary bladder wall thickness normal. Electronically Signed   By: Lowella Grip III M.D.   On: 04/04/2020 10:17     Assessment / Plan:   Assessment: 1.  Nausea/vomiting with scant hematemesis: Please see consult note, but this has been thoroughly evaluated with multiple EGDs, colonoscopy and a CT scan, she follows with [name] and carries a diagnosis of celiac disease, although genetic testing was negative, recent endoscopy in the past 6 months with erosions and esophagitis but unable to tolerate PPIs and H2 blockers 2.  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  Chronic abdominal pain 4.  Lupus  Plan: 1.  Continue Protonix 40 mg once a day as well as Carafate slurry twice daily 2.  Continue pain meds as needed 3.  Please await any further recommendations from Dr. Tarri Glenn later today  Thank you for kind consultation.   LOS: 0 days   Levin Erp  04/05/2020, 10:23 AM

## 2020-04-05 NOTE — Consult Note (Signed)
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  History of Present Illness:  Note from pulmonary visit 03/22/2020 reviewed.  History per EMR as well as patient.  Patient feeling ill middle of February thinks he was diagnosed with Covid late February 2022.  Symptom include bad cough, bad shortness of breath, dyspnea on exertion.  The symptoms have persisted.  Cough worse over the last few days.  With ongoing shortness of breath.  She presented to the ED 03/21/2020 and CTA PE protocol at that time on my interpretation revealed patchy groundglass opacities with predilection for for the periphery in the upper lobes and lower lobes bilaterally.  She was given doxycycline.  She presented to [facility] pulmonary clinic and saw Dr. Chase Caller 03/22/2020.  Recommended conservative measures.  A flurry of messages went back and forth she was started on prednisone taper.  Prednisone made her blood sugars go high and she felt unwell, foggy mental status.  She is unsure if the prednisone helped her breathing.  She cannot say that it certainly did but states that that time is very foggy and cannot recall.  She completed prednisone taper.  Cough worsened.  Shortness of breath persisted.  This prompted request for refill of Tussionex.  She knew she would need to be seen by a provider.  She went to urgent care and was told to go to the ED.  She called the on-call pulmonary physician after hours was also told to go to the ED.  So she presented to the ED for ongoing cough expecting a prescription to be sent home.  CTA abdomen pelvis and chest was without  abnormality and on my interpretation of the chest portion revealed clear lungs bilaterally with resolution of prior groundglass opacities, no other infiltrate, no interstitial changes or pleural effusions.  She was tachycardic.  Lactate was checked and trended up from 2-2.6.  This prompted observation and admission to [facility].  Cough is persistent.  Nonproductive.  No timing during day were better or worse.  Test next helps.  No other alleviating or exacerbating factors.  Shortness of breath is constant, no positional changes.  Albuterol has helped some at home.  No other relieving or exacerbating factors.  No timing during the day was better or worse.  All these are acute, no environmental changes.  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 TTE WNL.  Objective   Blood pressure 128/75, pulse 91, temperature 98.5 F (36.9 C), temperature source Oral, resp. rate 18, height 5' 2"  (1.575 m), weight 99.8 kg, SpO2 99 %.        Intake/Output Summary (Last 24 hours) at 04/05/2020 1821 Last data filed at 04/05/2020 1600 Gross per 24 hour  Intake 512.22 ml  Output -  Net 512.Crest  ml   Filed Weights   04/04/20 0358  Weight: 99.8 kg    Examination: General: Well-appearing, lying in bed Eyes: EOMI, no icterus Lungs: NWOB, on RA Cardiovascular: warm, no edema MSK: No synovitis, no joint effusion Psych: Normal mood, full affect  Resolved Hospital Problem list   N/a  Assessment & Plan:  Shortness of breath/dyspnea exertion/cough: Suspect are related to  recent COVID-19 infection.  Suspect will improve with time.  High suspicion for unmasking or exacerbating underlying asthma.  Unclear prednisone helped her breathing symptoms, certainly made her feel bad overall with high sugars etc.   Fortunately, CT chest is clear with resolution of GGO's, this indicates that her lung parenchyma is not the issue, possible asthma as above.  TTE WNL which is reassuring. No improvement with nebulized. --Nebulized Yupelri once daily, budesonide twice daily, arformoterol twice daily --No infiltrate, therefore no role for bronchoscopy --Offered inhaler, she declines as not helpful to date --Encouraged she keep upcoming Pulmonary clinic appt  Lactic Acidosis: resolved  We will sign off.  Best practice (evaluated daily)  Per primary  Goals of Care:  Per primary  Labs   CBC: Recent Labs  Lab 04/04/20 0500 04/05/20 0340  WBC 10.4 11.1*  HGB 11.2* 10.2*  HCT 35.6* 32.8*  MCV 71.9* 73.9*  PLT 465* 408*    Basic Metabolic Panel: Recent Labs  Lab 04/04/20 0500 04/05/20 0340  NA 138 138  K 3.9 3.5  CL 108 106  CO2 21* 21*  GLUCOSE 114* 100*  BUN 11 9  CREATININE 0.73 0.80  CALCIUM 9.4 8.8*   GFR: Estimated Creatinine Clearance: 117.8 mL/min (by C-G formula based on SCr of 0.8 mg/dL). Recent Labs  Lab 04/04/20 0500 04/04/20 0633 04/04/20 0808 04/04/20 1101 04/05/20 0340 04/05/20 0854  WBC 10.4  --   --   --  11.1*  --   LATICACIDVEN  --  2.0* 2.4* 2.6*  --  1.1    Liver Function Tests: Recent Labs  Lab 04/04/20 0500 04/05/20 0340  AST 19 18  ALT 13 13  ALKPHOS 65 60  BILITOT 0.2* 0.5  PROT 7.1 6.6  ALBUMIN 3.6 3.6   Recent Labs  Lab 04/04/20 0500  LIPASE 39   No results for input(s): AMMONIA in the last 168 hours.  ABG No results found for: PHART, PCO2ART, PO2ART, HCO3, TCO2, ACIDBASEDEF, O2SAT   Coagulation Profile: No results for input(s): INR, PROTIME in the last 168 hours.  Cardiac Enzymes: No results for  input(s): CKTOTAL, CKMB, CKMBINDEX, TROPONINI in the last 168 hours.  HbA1C: No results found for: HGBA1C  CBG: No results for input(s): GLUCAP in the last 168 hours.  Review of Systems:   No chest pain with exertion.  No orthopnea or PND.  Does have intermittent lower extremity swelling.  Comprehensive review of systems otherwise negative  Past Medical History:  She,  has a past medical history of Asthma, Celiac disease, Collagen vascular disease ([place]), Diarrhea, Endometriosis, Lupus ([place]), Ovarian cyst, and Pulmonary embolus ([place]) (02/06/2020).   Surgical History:   Past Surgical History:  Procedure Laterality Date  . APPENDECTOMY    . CHOLECYSTECTOMY    . EXCISION OF ENDOMETRIOMA     Ovarian cyst removal  . FOOT FRACTURE SURGERY     w/ hardware  . HERNIA REPAIR    . TONSILLECTOMY       Social History:   reports that she has never smoked. She has never used smokeless tobacco. She reports that  she does not drink alcohol and does not use drugs.   Family History:  Her family history includes Autism in her brother; Cancer in her father; Diabetes in her father; Hypercholesterolemia in her mother; Hypertension in her father and mother; Rheum arthritis in her mother.   Allergies Allergies  Allergen Reactions  . Azithromycin Anaphylaxis  . Peanut-Containing Drug Products Other (See Comments)    Unknown ; treenuts, shell fish Unknown reaction, took allergy test  . Iodinated Diagnostic Agents Hives and Itching    02/04/2020 Patient stated has history of itching and hives with CT IV Iodine contrast, premedicated with Benadryl 25 mg IV prior to CT IV Iodine scan today.Post CT scan patient reported itching, Benadryl 25 mg IV given, symptoms resolved, NP recommends for patient to get premedicated with Benadryl 50 mg IV prior to future CT IV Iodine scans.   . Other Other (See Comments)    Moderna covid vaccine -Syncope   . Amoxicillin Swelling    Hand swelling   . Fish Allergy Other  (See Comments)    Unknown Allergy test   . Gluten Meal Other (See Comments)    Celiac disease  . Hydromet [Hydrocodone-Homatropine] Nausea And Vomiting  . Ibuprofen Nausea And Vomiting  . Pantoprazole Other (See Comments)    Stomach pain     Home Medications  Prior to Admission medications   Medication Sig Start Date End Date Taking? Authorizing Provider  albuterol (PROVENTIL) (2.5 MG/3ML) 0.083% nebulizer solution Take 3 mLs (2.5 mg total) by nebulization every 6 (six) hours as needed for wheezing or shortness of breath. 03/19/20  Yes Volney American, PA-C  albuterol (VENTOLIN HFA) 108 (90 Base) MCG/ACT inhaler Inhale 2 puffs into the lungs every 6 (six) hours as needed for wheezing or shortness of breath.   Yes [provider]  ALPRAZolam Duanne Moron) 0.5 MG tablet Take 0.25-0.5 mg by mouth daily as needed for anxiety.   Yes [provider]  apixaban (ELIQUIS) 5 MG TABS tablet Take 5 mg by mouth 2 (two) times daily.   Yes [provider]  hydroxychloroquine (PLAQUENIL) 200 MG tablet Take 2 tablets (400 mg total) by mouth daily. 03/23/20  Yes Rice, Resa Miner, MD  Norethindrone-Ethinyl Estradiol-Fe Biphas (LO LOESTRIN FE) 1 MG-10 MCG / 10 MCG tablet Take 1 tablet by mouth daily.   Yes [provider]  promethazine (PHENERGAN) 12.5 MG tablet Take 1 tablet (12.5 mg total) by mouth every 6 (six) hours as needed for nausea or vomiting. 03/23/20  Yes Rice, Resa Miner, MD  sertraline (ZOLOFT) 100 MG tablet Take 200 mg by mouth at bedtime.   Yes [provider]  zolpidem (AMBIEN) 10 MG tablet Take 20 mg by mouth at bedtime as needed for sleep.    Yes [provider]  chlorpheniramine-HYDROcodone (TUSSIONEX PENNKINETIC ER) 10-8 MG/5ML SUER Take 5 mLs by mouth every 12 (twelve) hours as needed for cough. Patient not taking: Reported on 04/04/2020 03/23/20   Collier Salina, MD  doxycycline (VIBRAMYCIN) 100 MG capsule Take 1 capsule (100 mg  total) by mouth 2 (two) times daily. Patient not taking: Reported on 04/04/2020 03/18/20   Carmin Muskrat, MD  predniSONE (DELTASONE) 10 MG tablet Take 40 mg x 1 day, 30 mg x 1 day, 20 mg x 1 day, 10 mg x 1 day 5 mg x 1 day then stop Patient not taking: Reported on 04/04/2020 03/24/20   Brand Males, MD  dicyclomine (BENTYL) 20 MG tablet Take 1 tablet (20  mg total) by mouth 2 (two) times daily. 11/29/18 04/11/19  Muthersbaugh, Jarrett Soho, PA-C  Fluticasone Propionate HFA (FLOVENT HFA IN) Inhale into the lungs. Patient not taking: Reported on 11/14/2019  11/14/19  [provider]     Critical care time: n/a

## 2020-04-05 NOTE — Progress Notes (Signed)
[facility] for heparin Indication: hx pulmonary embolus and DVT (home Eliquis on hold)  Allergies  Allergen Reactions  . Azithromycin Anaphylaxis  . Peanut-Containing Drug Products Other (See Comments)    Unknown ; treenuts, shell fish Unknown reaction, took allergy test  . Iodinated Diagnostic Agents Hives and Itching    02/04/2020 Patient stated has history of itching and hives with CT IV Iodine contrast, premedicated with Benadryl 25 mg IV prior to CT IV Iodine scan today.Post CT scan patient reported itching, Benadryl 25 mg IV given, symptoms resolved, NP recommends for patient to get premedicated with Benadryl 50 mg IV prior to future CT IV Iodine scans.   . Other Other (See Comments)    Moderna covid vaccine -Syncope   . Amoxicillin Swelling    Hand swelling   . Fish Allergy Other (See Comments)    Unknown Allergy test   . Gluten Meal Other (See Comments)    Celiac disease  . Hydromet [Hydrocodone-Homatropine] Nausea And Vomiting  . Ibuprofen Nausea And Vomiting  . Pantoprazole Other (See Comments)    Stomach pain   Patient Measurements: Height: 5' 2"  (157.5 cm) Weight: 99.8 kg (220 lb) IBW/kg (Calculated) : 50.1 Heparin Dosing Weight: 74 kg  Vital Signs: Temp: 98.5 F (36.9 C) (03/15 1326) Temp Source: Oral (03/15 1326) BP: 128/75 (03/15 1326) Pulse Rate: 91 (03/15 1326)  Labs: Recent Labs    04/04/20 0500 04/04/20 0630 04/04/20 1526 04/04/20 1526 04/05/20 0340 04/05/20 1116 04/05/20 1835  HGB 11.2*  --   --   --  10.2*  --   --   HCT 35.6*  --   --   --  32.8*  --   --   PLT 465*  --   --   --  408*  --   --   APTT  --   --  30   < > 49* 86* 62*  HEPARINUNFRC  --   --  0.32  --  0.60  --   --   CREATININE 0.73  --   --   --  0.80  --   --   TROPONINIHS 5 5  --   --   --   --   --    < > = values in this interval not displayed.    Estimated Creatinine Clearance: 117.8 mL/min (by C-G formula based on SCr of 0.8  mg/dL).   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  Pharmacy has been consulted to start heparin infusion in case invasive intervention is needed.  - c/o intermittent hemoptysis on adm - 3/14 chest CTA: neg for PE Baseline Heparin level elevated at 0.32 due to interaction with Eliquis. Will monitor heparin using aPTT until Eliquis is eliminated and heparin level/aPTT correlate.   04/05/2020: -PM aPTT = 62 seconds, now slightly subtherapeutic  -CBC: Hgb slightly low/decreased to 10.2, Pltc slightly elevated at 408K - No bleeding or infusion related issues reported by RN  Goal of Therapy:  Heparin level 0.3-0.7 units/ml aPTT 66-102 seconds Monitor platelets by anticoagulation protocol: Yes   Plan:  Increase heparin infusion to 1650 units/hr Heparin level, aPTT 6 hours after rate change Daily CBC, heparin level, aPTT Monitor closely for s/sx of bleeding  Lindell Spar, PharmD, BCPS Clinical Pharmacist  04/05/2020,9:06 PM

## 2020-04-06 LAB — COMPREHENSIVE METABOLIC PANEL
ALT: 18 U/L (ref 0–44)
AST: 25 U/L (ref 15–41)
Albumin: 3.6 g/dL (ref 3.5–5.0)
Alkaline Phosphatase: 62 U/L (ref 38–126)
Anion gap: 12 (ref 5–15)
BUN: 7 mg/dL (ref 6–20)
CO2: 20 mmol/L — ABNORMAL LOW (ref 22–32)
Calcium: 9.3 mg/dL (ref 8.9–10.3)
Chloride: 105 mmol/L (ref 98–111)
Creatinine, Ser: 0.72 mg/dL (ref 0.44–1.00)
GFR, Estimated: >60 mL/min (ref >60)
Glucose, Bld: 113 mg/dL — ABNORMAL HIGH (ref 70–99)
Potassium: 3.6 mmol/L (ref 3.5–5.1)
Sodium: 137 mmol/L (ref 135–145)
Total Bilirubin: 0.8 mg/dL (ref 0.3–1.2)
Total Protein: 6.8 g/dL (ref 6.5–8.1)

## 2020-04-06 LAB — CBC
HCT: 33.9 % — ABNORMAL LOW (ref 36.0–46.0)
Hemoglobin: 10.6 g/dL — ABNORMAL LOW (ref 12.0–15.0)
MCH: 22.9 pg — ABNORMAL LOW (ref 26.0–34.0)
MCHC: 31.3 g/dL (ref 30.0–36.0)
MCV: 73.2 fL — ABNORMAL LOW (ref 80.0–100.0)
Platelets: 464 10*3/uL — ABNORMAL HIGH (ref 150–400)
RBC: 4.63 MIL/uL (ref 3.87–5.11)
RDW: 16.5 % — ABNORMAL HIGH (ref 11.5–15.5)
WBC: 12.2 10*3/uL — ABNORMAL HIGH (ref 4.0–10.5)
nRBC: 0 % (ref 0.0–0.2)

## 2020-04-06 LAB — APTT
aPTT: 103 seconds — ABNORMAL HIGH (ref 24–36)
aPTT: 92 seconds — ABNORMAL HIGH (ref 24–36)

## 2020-04-06 LAB — HEPARIN LEVEL (UNFRACTIONATED): Heparin Unfractionated: 1.16 IU/mL — ABNORMAL HIGH (ref 0.30–0.70)

## 2020-04-06 MED ORDER — BUDESONIDE 0.5 MG/2ML IN SUSP: 0.5000 mg | Freq: Two times a day (BID) | RESPIRATORY_TRACT | 3 refills | Status: DC

## 2020-04-06 MED ORDER — PANTOPRAZOLE SODIUM 40 MG PO TBEC: 40.0000 mg | DELAYED_RELEASE_TABLET | Freq: Every day | ORAL | 2 refills | Status: DC

## 2020-04-06 MED ORDER — SUCRALFATE 1 GM/10ML PO SUSP: 1.0000 g | Freq: Two times a day (BID) | ORAL | 0 refills | Status: DC

## 2020-04-06 MED ORDER — ARFORMOTEROL TARTRATE 15 MCG/2ML IN NEBU: 15.0000 ug | INHALATION_SOLUTION | Freq: Two times a day (BID) | RESPIRATORY_TRACT | 1 refills | Status: DC

## 2020-04-06 MED ORDER — REVEFENACIN 175 MCG/3ML IN SOLN: 175.0000 ug | Freq: Every day | RESPIRATORY_TRACT | 1 refills | Status: DC

## 2020-04-06 NOTE — Progress Notes (Signed)
[place] for heparin Indication: hx pulmonary embolus and DVT (home Eliquis on hold)  Allergies  Allergen Reactions  . Azithromycin Anaphylaxis  . Peanut-Containing Drug Products Other (See Comments)    Unknown ; treenuts, shell fish Unknown reaction, took allergy test  . Iodinated Diagnostic Agents Hives and Itching    02/04/2020 Patient stated has history of itching and hives with CT IV Iodine contrast, premedicated with Benadryl 25 mg IV prior to CT IV Iodine scan today.Post CT scan patient reported itching, Benadryl 25 mg IV given, symptoms resolved, NP recommends for patient to get premedicated with Benadryl 50 mg IV prior to future CT IV Iodine scans.   . Other Other (See Comments)    Moderna covid vaccine -Syncope   . Amoxicillin Swelling    Hand swelling   . Fish Allergy Other (See Comments)    Unknown Allergy test   . Gluten Meal Other (See Comments)    Celiac disease  . Hydromet [Hydrocodone-Homatropine] Nausea And Vomiting  . Ibuprofen Nausea And Vomiting  . Pantoprazole Other (See Comments)    Stomach pain   Patient Measurements: Height: 5' 2"  (157.5 cm) Weight: 99.8 kg (220 lb) IBW/kg (Calculated) : 50.1 Heparin Dosing Weight: 74 kg  Vital Signs: Temp: 98.7 F (37.1 C) (03/16 1322) Temp Source: Oral (03/16 1322) BP: 131/88 (03/16 1322) Pulse Rate: 92 (03/16 1322)  Labs: Recent Labs    04/04/20 0500 04/04/20 0500 04/04/20 0630 04/04/20 1526 04/05/20 0340 04/05/20 1116 04/05/20 1835 04/06/20 0341 04/06/20 1215  HGB 11.2*  --   --   --  10.2*  --   --  10.6*  --   HCT 35.6*  --   --   --  32.8*  --   --  33.9*  --   PLT 465*  --   --   --  408*  --   --  464*  --   APTT  --    < >  --  30 49*   < > 62* 103* 92*  HEPARINUNFRC  --   --   --  0.32 0.60  --   --  1.16*  --   CREATININE 0.73  --   --   --  0.80  --   --  0.72  --   TROPONINIHS 5  --  5  --   --   --   --   --   --    < > = values in this interval  not displayed.   Estimated Creatinine Clearance: 117.8 mL/min (by C-G formula based on SCr of 0.72 mg/dL).  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  Pharmacy has been consulted to start heparin infusion in case invasive intervention is needed.  - c/o intermittent hemoptysis on adm - 3/14 chest CTA: neg for PE Baseline Heparin level elevated at 0.32 due to interaction with Eliquis. Will monitor heparin using aPTT until Eliquis is eliminated and heparin level/aPTT correlate.   04/06/2020: 0341 aPTT = 103 seconds, now slightly supra-therapeutic> decr Heparin to 1550 units/hr -Heparin level 1.16- falsely elevated to due interaction with Eliquis.  -CBC: Hgb slightly low/stable, Pltc slightly elevated/stable - No bleeding or infusion related issues reported by RN 1215 aPTT =  92 seconds, at higher end of therapeutic range  Goal of Therapy:  Heparin level 0.3-0.7 units/ml aPTT 66-102 seconds Monitor platelets by anticoagulation protocol: Yes   Plan:  Decrease heparin infusion to 1500 units/hr Recheck aPTT 8 hours after rate change - 2200 Daily CBC, monitoring with aPTT levels for now,  Monitor closely for s/sx of bleeding   Minda Ditto PharmD 04/06/2020, 1:26 PM

## 2020-04-06 NOTE — Progress Notes (Signed)
[place] for heparin Indication: hx pulmonary embolus and DVT (home Eliquis on hold)  Allergies  Allergen Reactions  . Azithromycin Anaphylaxis  . Peanut-Containing Drug Products Other (See Comments)    Unknown ; treenuts, shell fish Unknown reaction, took allergy test  . Iodinated Diagnostic Agents Hives and Itching    02/04/2020 Patient stated has history of itching and hives with CT IV Iodine contrast, premedicated with Benadryl 25 mg IV prior to CT IV Iodine scan today.Post CT scan patient reported itching, Benadryl 25 mg IV given, symptoms resolved, NP recommends for patient to get premedicated with Benadryl 50 mg IV prior to future CT IV Iodine scans.   . Other Other (See Comments)    Moderna covid vaccine -Syncope   . Amoxicillin Swelling    Hand swelling   . Fish Allergy Other (See Comments)    Unknown Allergy test   . Gluten Meal Other (See Comments)    Celiac disease  . Hydromet [Hydrocodone-Homatropine] Nausea And Vomiting  . Ibuprofen Nausea And Vomiting  . Pantoprazole Other (See Comments)    Stomach pain   Patient Measurements: Height: 5' 2"  (157.5 cm) Weight: 99.8 kg (220 lb) IBW/kg (Calculated) : 50.1 Heparin Dosing Weight: 74 kg  Vital Signs: Temp: 98.3 F (36.8 C) (03/15 2100) Temp Source: Oral (03/15 2100) BP: 119/81 (03/15 2100) Pulse Rate: 83 (03/15 2100)  Labs: Recent Labs    04/04/20 0500 04/04/20 0500 04/04/20 0630 04/04/20 1526 04/05/20 0340 04/05/20 1116 04/05/20 1835 04/06/20 0341  HGB 11.2*  --   --   --  10.2*  --   --  10.6*  HCT 35.6*  --   --   --  32.8*  --   --  33.9*  PLT 465*  --   --   --  408*  --   --  464*  APTT  --    < >  --  30 49* 86* 62* 103*  HEPARINUNFRC  --   --   --  0.32 0.60  --   --  1.16*  CREATININE 0.73  --   --   --  0.80  --   --  0.72  TROPONINIHS 5  --  5  --   --   --   --   --    < > = values in this interval not displayed.    Estimated Creatinine Clearance:  117.8 mL/min (by C-G formula based on SCr of 0.72 mg/dL).   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  Pharmacy has been consulted to start heparin infusion in case invasive intervention is needed.  - c/o intermittent hemoptysis on adm - 3/14 chest CTA: neg for PE Baseline Heparin level elevated at 0.32 due to interaction with Eliquis. Will monitor heparin using aPTT until Eliquis is eliminated and heparin level/aPTT correlate.   04/06/2020: -aPTT = 103 seconds, now slightly supra-therapeutic -Heparin level 1.16- falsely elevated to due interaction with Eliquis.  -CBC: Hgb slightly low/stable, Pltc slightly elevated/stable - No bleeding or infusion related issues reported by RN  Goal of Therapy:  Heparin level 0.3-0.7 units/ml aPTT 66-102 seconds Monitor platelets by anticoagulation protocol: Yes   Plan:  Decrease heparin infusion to 1550 units/hr Recheck aPTT 6  hours after rate change Daily CBC, heparin level, aPTT Monitor closely for s/sx of bleeding    Netta Cedars, PharmD, BCPS Clinical Pharmacist  04/06/2020,5:19 AM

## 2020-04-06 NOTE — Progress Notes (Signed)
Progress Note   Subjective  Chief Complaint: Nausea, vomiting and chronic abdominal pain  This morning, patient tells me that she is doing better, her diarrhea has subsided and her abdominal pain is better.  She thinks some of this is just from "my hernia and it hurts when people push on it".  Tells me the only new thing she is having are migraines and she thinks that is related to being in the hospital.  Tells me that she thinks she can go home.   Objective   Vital signs in last 24 hours: Temp:  [98.3 F (36.8 C)-98.5 F (36.9 C)] 98.3 F (36.8 C) (03/16 0532) Pulse Rate:  [75-91] 75 (03/16 0532) Resp:  [18-21] 21 (03/16 0532) BP: (119-128)/(75-81) 119/79 (03/16 0532) SpO2:  [98 %-100 %] 98 % (03/16 0532) Last BM Date: 04/04/20 General:   Obese AA female in NAD Heart:  Regular rate and rhythm; no murmurs Lungs: Respirations even and unlabored, lungs CTA bilaterally Abdomen:  Soft, mild ttp around umbilicus and nondistended. Normal bowel sounds. Extremities:  Without edema. Neurologic:  Alert and oriented,  grossly normal neurologically. Psych:  Cooperative. Normal mood and affect.  Intake/Output from previous day: 03/15 0701 - 03/16 0700 In: 1194.8 [P.O.:840; I.V.:354.8] Out: -   Lab Results: Recent Labs    04/04/20 0500 04/05/20 0340 04/06/20 0341  WBC 10.4 11.1* 12.2*  HGB 11.2* 10.2* 10.6*  HCT 35.6* 32.8* 33.9*  PLT 465* 408* 464*   BMET Recent Labs    04/04/20 0500 04/05/20 0340 04/06/20 0341  NA 138 138 137  K 3.9 3.5 3.6  CL 108 106 105  CO2 21* 21* 20*  GLUCOSE 114* 100* 113*  BUN 11 9 7   CREATININE 0.73 0.80 0.72  CALCIUM 9.4 8.8* 9.3   LFT Recent Labs    04/06/20 0341  PROT 6.8  ALBUMIN 3.6  AST 25  ALT 18  ALKPHOS 62  BILITOT 0.8   Studies/Results: CT Angio Chest PE W and/or Wo Contrast  Result Date: 04/04/2020 CLINICAL DATA:  Chest pain.  Abdomen pain. EXAM: CT ANGIOGRAPHY CHEST CT ABDOMEN AND PELVIS WITH CONTRAST TECHNIQUE:  Multidetector CT imaging of the chest was performed using the standard protocol during bolus administration of intravenous contrast. Multiplanar CT image reconstructions and MIPs were obtained to evaluate the vascular anatomy. Multidetector CT imaging of the abdomen and pelvis was performed using the standard protocol during bolus administration of intravenous contrast. CONTRAST:  19m OMNIPAQUE IOHEXOL 350 MG/ML SOLN COMPARISON:  None. CT abdomen and pelvis November 13, 2018 1; chest CT March 18, 2020 FINDINGS: CTA CHEST FINDINGS Cardiovascular: There is no evident pulmonary embolus. No thoracic aortic aneurysm or dissection. Visualized great vessels appear normal. No thoracic aortic aneurysm or dissection. Mediastinum/Nodes: Thyroid appears normal. Small amount of thymic tissue is normal for age. No evident thoracic adenopathy. No esophageal lesions evident. Lungs/Pleura: Interval clearing of infiltrate compared to prior CT. Lungs now clear. No pleural effusion. No pneumothorax. Trachea and major bronchial structures appear patent. Musculoskeletal: No blastic or lytic bone lesions. Review of the MIP images confirms the above findings. CT ABDOMEN and PELVIS FINDINGS Hepatobiliary: There is a 5 mm cyst in the anterior segment right lobe of the liver. No other focal liver lesions are evident. Gallbladder is absent. There is no appreciable biliary dilatation. Pancreas: There is no pancreatic mass or inflammatory focus. Spleen: No splenic lesions are evident. Adrenals/Urinary Tract: Adrenals bilaterally appear normal. There is no appreciable renal mass or hydronephrosis  on either side. There is no evident renal or ureteral calculus on either side. Urinary bladder is midline with wall thickness within normal limits. Stomach/Bowel: There is no appreciable bowel wall or mesenteric thickening. Moderate stool noted in the colon. No bowel obstruction. Terminal ileum appears normal. No free air or portal venous air.  Appendix absent. No periappendiceal region inflammation. Vascular/Lymphatic: No abdominal aortic aneurysm. No arterial vascular lesions are evident. Major venous structures appear patent. There is no evident adenopathy in the abdomen or pelvis. Reproductive: The uterus is anteverted.  No evident adnexal mass. Other: No abscess or ascites in the abdomen or pelvis. Musculoskeletal: No blastic or lytic bone lesions. No abdominal wall or intramuscular lesions. Review of the MIP images confirms the above findings. IMPRESSION: CT angiogram chest: 1. No demonstrable pulmonary embolus. No thoracic aortic aneurysm or dissection. 2.  Lungs now clear. 3.  No adenopathy. CT abdomen and pelvis: 1. A cause for patient's symptoms has not been established with this study. 2. No bowel wall thickening or bowel obstruction. No abscess in the abdomen or pelvis. Appendix absent. 3.  Gallbladder absent. 4. No evident renal or ureteral calculus. No hydronephrosis. Urinary bladder wall thickness normal. Electronically Signed   By: Lowella Grip III M.D.   On: 04/04/2020 10:17   CT ABDOMEN PELVIS W CONTRAST  Result Date: 04/04/2020 CLINICAL DATA:  Chest pain.  Abdomen pain. EXAM: CT ANGIOGRAPHY CHEST CT ABDOMEN AND PELVIS WITH CONTRAST TECHNIQUE: Multidetector CT imaging of the chest was performed using the standard protocol during bolus administration of intravenous contrast. Multiplanar CT image reconstructions and MIPs were obtained to evaluate the vascular anatomy. Multidetector CT imaging of the abdomen and pelvis was performed using the standard protocol during bolus administration of intravenous contrast. CONTRAST:  142m OMNIPAQUE IOHEXOL 350 MG/ML SOLN COMPARISON:  None. CT abdomen and pelvis November 13, 2018 1; chest CT March 18, 2020 FINDINGS: CTA CHEST FINDINGS Cardiovascular: There is no evident pulmonary embolus. No thoracic aortic aneurysm or dissection. Visualized great vessels appear normal. No thoracic aortic  aneurysm or dissection. Mediastinum/Nodes: Thyroid appears normal. Small amount of thymic tissue is normal for age. No evident thoracic adenopathy. No esophageal lesions evident. Lungs/Pleura: Interval clearing of infiltrate compared to prior CT. Lungs now clear. No pleural effusion. No pneumothorax. Trachea and major bronchial structures appear patent. Musculoskeletal: No blastic or lytic bone lesions. Review of the MIP images confirms the above findings. CT ABDOMEN and PELVIS FINDINGS Hepatobiliary: There is a 5 mm cyst in the anterior segment right lobe of the liver. No other focal liver lesions are evident. Gallbladder is absent. There is no appreciable biliary dilatation. Pancreas: There is no pancreatic mass or inflammatory focus. Spleen: No splenic lesions are evident. Adrenals/Urinary Tract: Adrenals bilaterally appear normal. There is no appreciable renal mass or hydronephrosis on either side. There is no evident renal or ureteral calculus on either side. Urinary bladder is midline with wall thickness within normal limits. Stomach/Bowel: There is no appreciable bowel wall or mesenteric thickening. Moderate stool noted in the colon. No bowel obstruction. Terminal ileum appears normal. No free air or portal venous air. Appendix absent. No periappendiceal region inflammation. Vascular/Lymphatic: No abdominal aortic aneurysm. No arterial vascular lesions are evident. Major venous structures appear patent. There is no evident adenopathy in the abdomen or pelvis. Reproductive: The uterus is anteverted.  No evident adnexal mass. Other: No abscess or ascites in the abdomen or pelvis. Musculoskeletal: No blastic or lytic bone lesions. No abdominal wall or intramuscular lesions.  Review of the MIP images confirms the above findings. IMPRESSION: CT angiogram chest: 1. No demonstrable pulmonary embolus. No thoracic aortic aneurysm or dissection. 2.  Lungs now clear. 3.  No adenopathy. CT abdomen and pelvis: 1. A cause  for patient's symptoms has not been established with this study. 2. No bowel wall thickening or bowel obstruction. No abscess in the abdomen or pelvis. Appendix absent. 3.  Gallbladder absent. 4. No evident renal or ureteral calculus. No hydronephrosis. Urinary bladder wall thickness normal. Electronically Signed   By: Lowella Grip III M.D.   On: 04/04/2020 10:17   ECHOCARDIOGRAM COMPLETE  Result Date: 04/05/2020    ECHOCARDIOGRAM REPORT   Patient Name:   [name] Date of Exam: 04/05/2020 Medical Rec #:  [id number]         Height:       62.0 in Accession #:    [id number]        Weight:       220.0 lb Date of Birth:  04/04/1995         BSA:          1.991 m Patient Age:    24 years          BP:           128/75 mmHg Patient Gender: F                 HR:           85 bpm. Exam Location:  Inpatient Procedure: 2D Echo, Cardiac Doppler and Color Doppler Indications:    R06.02 SOB  History:        Patient has no prior history of Echocardiogram examinations.                 Signs/Symptoms:Shortness of Breath, Dyspnea and Chest Pain.                 Lupus.  Sonographer:    Roseanna Rainbow RDCS Referring Phys: [phone] MATTHEW R HUNSUCKER  Sonographer Comments: Technically difficult study due to poor echo windows and patient is morbidly obese. Image acquisition challenging due to patient body habitus. IMPRESSIONS  1. Left ventricular ejection fraction, by estimation, is 60 to 65%. The left ventricle has normal function. The left ventricle has no regional wall motion abnormalities. Left ventricular diastolic parameters were normal.  2. Right ventricular systolic function is normal. The right ventricular size is normal. Tricuspid regurgitation signal is inadequate for assessing PA pressure.  3. The mitral valve is normal in structure. No evidence of mitral valve regurgitation. No evidence of mitral stenosis.  4. The aortic valve is normal in structure. Aortic valve regurgitation is not visualized. No aortic stenosis is  present.  5. The inferior vena cava is normal in size with greater than 50% respiratory variability, suggesting right atrial pressure of 3 mmHg. FINDINGS  Left Ventricle: Left ventricular ejection fraction, by estimation, is 60 to 65%. The left ventricle has normal function. The left ventricle has no regional wall motion abnormalities. The left ventricular internal cavity size was normal in size. There is  borderline concentric left ventricular hypertrophy. Left ventricular diastolic parameters were normal. Normal left ventricular filling pressure. Right Ventricle: The right ventricular size is normal. No increase in right ventricular wall thickness. Right ventricular systolic function is normal. Tricuspid regurgitation signal is inadequate for assessing PA pressure. Left Atrium: Left atrial size was normal in size. Right Atrium: Right atrial size was normal in size. Pericardium: There is  no evidence of pericardial effusion. Mitral Valve: The mitral valve is normal in structure. No evidence of mitral valve regurgitation. No evidence of mitral valve stenosis. Tricuspid Valve: The tricuspid valve is normal in structure. Tricuspid valve regurgitation is not demonstrated. No evidence of tricuspid stenosis. Aortic Valve: The aortic valve is normal in structure. Aortic valve regurgitation is not visualized. No aortic stenosis is present. Pulmonic Valve: The pulmonic valve was normal in structure. Pulmonic valve regurgitation is not visualized. No evidence of pulmonic stenosis. Aorta: The aortic root is normal in size and structure. Venous: The inferior vena cava is normal in size with greater than 50% respiratory variability, suggesting right atrial pressure of 3 mmHg. IAS/Shunts: No atrial level shunt detected by color flow Doppler.  LEFT VENTRICLE PLAX 2D LVIDd:         3.76 cm     Diastology LVIDs:         2.47 cm     LV e' medial:    6.96 cm/s LV PW:         1.27 cm     LV E/e' medial:  13.0 LV IVS:        1.35 cm      LV e' lateral:   12.90 cm/s LVOT diam:     1.90 cm     LV E/e' lateral: 7.0 LV SV:         68 LV SV Index:   34 LVOT Area:     2.84 cm  LV Volumes (MOD) LV vol d, MOD A2C: 79.4 ml LV vol d, MOD A4C: 90.0 ml LV vol s, MOD A2C: 34.3 ml LV vol s, MOD A4C: 32.0 ml LV SV MOD A2C:     45.1 ml LV SV MOD A4C:     90.0 ml LV SV MOD BP:      51.9 ml RIGHT VENTRICLE             IVC RV S prime:     11.40 cm/s  IVC diam: 1.44 cm TAPSE (M-mode): 1.9 cm LEFT ATRIUM         Index LA diam:    3.40 cm 1.71 cm/m  AORTIC VALVE             PULMONIC VALVE LVOT Vmax:   122.00 cm/s PR End Diast Vel: 1.51 msec LVOT Vmean:  79.200 cm/s LVOT VTI:    0.239 m  AORTA Ao Root diam: 2.70 cm Ao Asc diam:  2.90 cm MITRAL VALVE MV Area (PHT): 6.32 cm    SHUNTS MV Decel Time: 120 msec    Systemic VTI:  0.24 m MV E velocity: 90.60 cm/s  Systemic Diam: 1.90 cm MV A velocity: 61.20 cm/s MV E/A ratio:  1.48 Mihai Croitoru MD Electronically signed by Sanda Klein MD Signature Date/Time: 04/05/2020/5:10:04 PM    Final     Assessment / Plan:   Assessment: 1.  Nausea/vomiting with scant hematemesis: Please see consult note, this is been thoroughly evaluated multiple EGDs, colonoscopy and a CT scan, she follows with [name], recent endoscopy in the past 6 months with erosions and esophagitis but previously unable to tolerate PPIs and H2 blockers, now patient believes she is at her baseline with decreased abdominal pain no further diarrhea 2.  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  Chronic abdominal pain 4.  Lupus  Plan: 1.  In discussing with the patient today she would like to go home, she feels that she is at her baseline and  her diarrhea has improved. 2.  Would continue the patient on Protonix as well as Carafate 3.  Patient can follow-up with her regular GI physicians 4.  Please await any final recommendations from Dr. Tarri Glenn  Thank you for kind consultation, we will sign off.    LOS: 1 day   Levin Erp  04/06/2020, 9:13  AM

## 2020-04-06 NOTE — Discharge Summary (Signed)
Physician Discharge Summary  [name] HQR:[id number] DOB: 1995-10-02 DOA: 04/04/2020  PCP: [facility]  Admit date: 04/04/2020 Discharge date: 04/06/2020  Time spent: 50 minutes  Recommendations for Outpatient Follow-up:  1. Follow-up pulmonology in 2 weeks 2. Follow-up gastroenterology as outpatient  Discharge Diagnoses:  Principal Problem:   Shortness of breath Active Problems:   Gastritis   Personal history of pulmonary embolism   Chronic nausea   Abdominal pain   SLE (systemic lupus erythematosus related syndrome) (HCC)   Obesity, Class III, BMI 40-49.9 (morbid obesity) (HCC)   SOB (shortness of breath)   Discharge Condition: Stable  Diet recommendation: Heart healthy diet  Filed Weights   04/04/20 0358  Weight: 99.8 kg    History of present illness:  25 year old female with history of SLE on Plaquenil, recurrent venous thromboembolism, on Eliquis, recent COVID-19 infection in February 2022 presented with ongoing, worsening cough and shortness of breath/dyspnea on exertion.  Note from pulmonary visit 03/22/2020    Patient feeling ill middle of February thinks he was diagnosed with Covid late February 2022.  Symptom include bad cough, bad shortness of breath, dyspnea on exertion.  The symptoms have persisted.  Cough worse over the last few days.  With ongoing shortness of breath.  She presented to the ED 03/21/2020 and CTA PE protocol at that time on my interpretation revealed patchy groundglass opacities with predilection for for the periphery in the upper lobes and lower lobes bilaterally.  She was given doxycycline.  She presented to [facility] pulmonary clinic and saw Dr. Chase Caller 03/22/2020.  Recommended conservative measures.  A flurry of messages went back and forth she was started on prednisone taper.  Prednisone made her blood sugars go high and she felt unwell, foggy mental status.  She is unsure if the prednisone helped her breathing.  She cannot say  that it certainly did but states that that time is very foggy and cannot recall.  She completed prednisone taper.  Cough worsened.  Shortness of breath persisted.  This prompted request for refill of Tussionex.  She knew she would need to be seen by a provider.  She went to urgent care and was told to go to the ED.  She called the on-call pulmonary physician after hours was also told to go to the ED.  So she presented to the ED for ongoing cough expecting a prescription to be sent home.  CTA abdomen pelvis and chest was without abnormality and on my interpretation of the chest portion revealed clear lungs bilaterally with resolution of prior groundglass opacities, no other infiltrate, no interstitial changes or pleural effusions.  She was tachycardic.  Lactate was checked and trended up from 2-2.6.    Hospital Course:  Dyspnea on exertion-likely from recent COVID-19 infection, continue to improve.  Patient is no longer requiring oxygen.  Patient was prescribed prednisone but felt overall bad and also had high blood sugar.  She is currently not on prednisone.  Chest CT is clear.  Pulmonology has signed off.  Patient will continue with nebulized Yupelri once daily, budesonide twice daily, arformoterol twice daily.  No need for bronchoscopy.  Patient to follow-up with pulmonology as outpatient.  Nausea vomiting/hematemesis-she was seen by GI on 04/04/2020, as per GI patient had multiple EGDs, colonoscopy and CT scan.  Most recently followed by Dr. Alan Ripper at [place].  She carries a diagnosis of celiac disease, although genetic testing is negative.  She had colonoscopy performed within the last 6 months to  evaluate the symptoms.  Patient report gastric erosions and esophagitis.  Gastroenterology has signed off, continue patient on Protonix 40 mg daily, Carafate.  She will follow up with Dr. Alan Ripper at [place] as  outpatient.    Procedures:  None  Consultations:  Pulmonology  Gastroenterology  Discharge Exam: Vitals:   04/06/20 0532 04/06/20 1322  BP: 119/79 131/88  Pulse: 75 92  Resp: (!) 21 18  Temp: 98.3 F (36.8 C) 98.7 F (37.1 C)  SpO2: 98% 99%    General: Appears in no acute distress Cardiovascular: S1-S2, regular Respiratory: Clear to auscultation bilaterally  Discharge Instructions   Discharge Instructions    Diet - low sodium heart healthy   Complete by: As directed    Increase activity slowly   Complete by: As directed      Allergies as of 04/06/2020      Reactions   Azithromycin Anaphylaxis   Peanut-containing Drug Products Other (See Comments)   Unknown ; treenuts, shell fish Unknown reaction, took allergy test   Iodinated Diagnostic Agents Hives, Itching   02/04/2020 Patient stated has history of itching and hives with CT IV Iodine contrast, premedicated with Benadryl 25 mg IV prior to CT IV Iodine scan today.Post CT scan patient reported itching, Benadryl 25 mg IV given, symptoms resolved, NP recommends for patient to get premedicated with Benadryl 50 mg IV prior to future CT IV Iodine scans.    Other Other (See Comments)   Moderna covid vaccine -Syncope    Amoxicillin Swelling   Hand swelling    Fish Allergy Other (See Comments)   Unknown Allergy test    Gluten Meal Other (See Comments)   Celiac disease   Hydromet [hydrocodone-homatropine] Nausea And Vomiting   Ibuprofen Nausea And Vomiting   Pantoprazole Other (See Comments)   Stomach pain      Medication List    STOP taking these medications   chlorpheniramine-HYDROcodone 10-8 MG/5ML Suer Commonly known as: Tussionex Pennkinetic ER   doxycycline 100 MG capsule Commonly known as: VIBRAMYCIN   predniSONE 10 MG tablet Commonly known as: DELTASONE   promethazine 12.5 MG tablet Commonly known as: PHENERGAN   zolpidem 10 MG tablet Commonly known as: AMBIEN     TAKE these medications    albuterol (2.5 MG/3ML) 0.083% nebulizer solution Commonly known as: PROVENTIL Take 3 mLs (2.5 mg total) by nebulization every 6 (six) hours as needed for wheezing or shortness of breath. What changed: Another medication with the same name was removed. Continue taking this medication, and follow the directions you see here.   ALPRAZolam 0.5 MG tablet Commonly known as: XANAX Take 0.25-0.5 mg by mouth daily as needed for anxiety.   apixaban 5 MG Tabs tablet Commonly known as: ELIQUIS Take 5 mg by mouth 2 (two) times daily.   arformoterol 15 MCG/2ML Nebu Commonly known as: BROVANA Take 2 mLs (15 mcg total) by nebulization 2 (two) times daily.   budesonide 0.5 MG/2ML nebulizer solution Commonly known as: PULMICORT Take 2 mLs (0.5 mg total) by nebulization 2 (two) times daily.   hydroxychloroquine 200 MG tablet Commonly known as: PLAQUENIL Take 2 tablets (400 mg total) by mouth daily.   Lo Loestrin Fe 1 MG-10 MCG / 10 MCG tablet Generic drug: Norethindrone-Ethinyl Estradiol-Fe Biphas Take 1 tablet by mouth daily.   pantoprazole 40 MG tablet Commonly known as: PROTONIX Take 1 tablet (40 mg total) by mouth daily at 6 (six) AM. Start taking on: April 07, 2020   revefenacin 175  MCG/3ML nebulizer solution Commonly known as: YUPELRI Take 3 mLs (175 mcg total) by nebulization daily. Start taking on: April 07, 2020   sertraline 100 MG tablet Commonly known as: ZOLOFT Take 200 mg by mouth at bedtime.   sucralfate 1 GM/10ML suspension Commonly known as: CARAFATE Take 10 mLs (1 g total) by mouth 2 (two) times daily.      Allergies  Allergen Reactions  . Azithromycin Anaphylaxis  . Peanut-Containing Drug Products Other (See Comments)    Unknown ; treenuts, shell fish Unknown reaction, took allergy test  . Iodinated Diagnostic Agents Hives and Itching    02/04/2020 Patient stated has history of itching and hives with CT IV Iodine contrast, premedicated with Benadryl 25 mg IV  prior to CT IV Iodine scan today.Post CT scan patient reported itching, Benadryl 25 mg IV given, symptoms resolved, NP recommends for patient to get premedicated with Benadryl 50 mg IV prior to future CT IV Iodine scans.   . Other Other (See Comments)    Moderna covid vaccine -Syncope   . Amoxicillin Swelling    Hand swelling   . Fish Allergy Other (See Comments)    Unknown Allergy test   . Gluten Meal Other (See Comments)    Celiac disease  . Hydromet [Hydrocodone-Homatropine] Nausea And Vomiting  . Ibuprofen Nausea And Vomiting  . Pantoprazole Other (See Comments)    Stomach pain    Follow-up Information    Shahid, Twana First, MD Follow up.   Specialty: Gastroenterology Why: call her office to arrange fup with MD or with PA/NP.   Contact information: [address] [place] [address] [phone]                The results of significant diagnostics from this hospitalization (including imaging, microbiology, ancillary and laboratory) are listed below for reference.    Significant Diagnostic Studies: DG Chest 2 View  Result Date: 04/04/2020 CLINICAL DATA:  Shortness of breath.  COVID induced pneumonia. EXAM: CHEST - 2 VIEW COMPARISON:  Chest x-ray 03/01/2020, CT chest 03/18/2020 FINDINGS: The heart size and mediastinal contours are within normal limits. No focal consolidation. No pulmonary edema. No pleural effusion. No pneumothorax. No acute osseous abnormality. IMPRESSION: No active cardiopulmonary disease. Electronically Signed   By: Iven Finn M.D.   On: 04/04/2020 05:23   CT Angio Chest PE W and/or Wo Contrast  Result Date: 04/04/2020 CLINICAL DATA:  Chest pain.  Abdomen pain. EXAM: CT ANGIOGRAPHY CHEST CT ABDOMEN AND PELVIS WITH CONTRAST TECHNIQUE: Multidetector CT imaging of the chest was performed using the standard protocol during bolus administration of intravenous contrast. Multiplanar CT image reconstructions and MIPs were obtained to evaluate the  vascular anatomy. Multidetector CT imaging of the abdomen and pelvis was performed using the standard protocol during bolus administration of intravenous contrast. CONTRAST:  181m OMNIPAQUE IOHEXOL 350 MG/ML SOLN COMPARISON:  None. CT abdomen and pelvis November 13, 2018 1; chest CT March 18, 2020 FINDINGS: CTA CHEST FINDINGS Cardiovascular: There is no evident pulmonary embolus. No thoracic aortic aneurysm or dissection. Visualized great vessels appear normal. No thoracic aortic aneurysm or dissection. Mediastinum/Nodes: Thyroid appears normal. Small amount of thymic tissue is normal for age. No evident thoracic adenopathy. No esophageal lesions evident. Lungs/Pleura: Interval clearing of infiltrate compared to prior CT. Lungs now clear. No pleural effusion. No pneumothorax. Trachea and major bronchial structures appear patent. Musculoskeletal: No blastic or lytic bone lesions. Review of the MIP images confirms the above findings. CT ABDOMEN and PELVIS  FINDINGS Hepatobiliary: There is a 5 mm cyst in the anterior segment right lobe of the liver. No other focal liver lesions are evident. Gallbladder is absent. There is no appreciable biliary dilatation. Pancreas: There is no pancreatic mass or inflammatory focus. Spleen: No splenic lesions are evident. Adrenals/Urinary Tract: Adrenals bilaterally appear normal. There is no appreciable renal mass or hydronephrosis on either side. There is no evident renal or ureteral calculus on either side. Urinary bladder is midline with wall thickness within normal limits. Stomach/Bowel: There is no appreciable bowel wall or mesenteric thickening. Moderate stool noted in the colon. No bowel obstruction. Terminal ileum appears normal. No free air or portal venous air. Appendix absent. No periappendiceal region inflammation. Vascular/Lymphatic: No abdominal aortic aneurysm. No arterial vascular lesions are evident. Major venous structures appear patent. There is no evident  adenopathy in the abdomen or pelvis. Reproductive: The uterus is anteverted.  No evident adnexal mass. Other: No abscess or ascites in the abdomen or pelvis. Musculoskeletal: No blastic or lytic bone lesions. No abdominal wall or intramuscular lesions. Review of the MIP images confirms the above findings. IMPRESSION: CT angiogram chest: 1. No demonstrable pulmonary embolus. No thoracic aortic aneurysm or dissection. 2.  Lungs now clear. 3.  No adenopathy. CT abdomen and pelvis: 1. A cause for patient's symptoms has not been established with this study. 2. No bowel wall thickening or bowel obstruction. No abscess in the abdomen or pelvis. Appendix absent. 3.  Gallbladder absent. 4. No evident renal or ureteral calculus. No hydronephrosis. Urinary bladder wall thickness normal. Electronically Signed   By: Lowella Grip III M.D.   On: 04/04/2020 10:17   CT Angio Chest PE W/Cm &/Or Wo Cm  Result Date: 03/18/2020 CLINICAL DATA:  Worsening shortness of breath and chest pain today. History pulmonary embolism on chronic anticoagulation. Not recently taking anticoagulation medications. EXAM: CT ANGIOGRAPHY CHEST WITH CONTRAST TECHNIQUE: Multidetector CT imaging of the chest was performed using the standard protocol during bolus administration of intravenous contrast. Multiplanar CT image reconstructions and MIPs were obtained to evaluate the vascular anatomy. CONTRAST:  180m OMNIPAQUE IOHEXOL 350 MG/ML SOLN COMPARISON:  Chest radiographs 03/01/2020. Abdominopelvic CT 11/13/2019. No prior chest CT available. FINDINGS: Cardiovascular: The pulmonary arteries are adequately but not optimally opacified with contrast to the level of the subsegmental branches. There is no evidence of acute pulmonary embolism. No evidence of acute systemic arterial abnormality. The heart size is normal. There is no pericardial effusion. Mediastinum/Nodes: There are no enlarged mediastinal, hilar or axillary lymph nodes.Prevascular soft  tissue most consistent with residual thymic tissue. The thyroid gland, trachea and esophagus demonstrate no significant findings. Lungs/Pleura: No pleural effusion or pneumothorax. There are patchy airspace opacities in both lungs. Dependent components in both lower lobes likely represent atelectasis. There is a more diffuse component in the left upper lobe which could reflect infection. There is a smaller dependent component posteriorly in the right upper lobe. No consolidation or suspicious pulmonary nodularity. Upper abdomen: No significant findings are seen within the visualized upper abdomen. There is a probable cyst in the dome of the liver which appears unchanged. Previous cholecystectomy. Musculoskeletal/Chest wall: There is no chest wall mass or suspicious osseous finding. Review of the MIP images confirms the above findings. IMPRESSION: 1. No evidence of acute pulmonary embolism or other acute vascular findings. 2. Patchy airspace opacities in both lungs with a more diffuse component in the left upper lobe which could reflect infection. Dependent components in both lower lobes likely represent atelectasis.  Recommend radiographic follow-up. Electronically Signed   By: Richardean Sale M.D.   On: 03/18/2020 19:14   CT ABDOMEN PELVIS W CONTRAST  Result Date: 04/04/2020 CLINICAL DATA:  Chest pain.  Abdomen pain. EXAM: CT ANGIOGRAPHY CHEST CT ABDOMEN AND PELVIS WITH CONTRAST TECHNIQUE: Multidetector CT imaging of the chest was performed using the standard protocol during bolus administration of intravenous contrast. Multiplanar CT image reconstructions and MIPs were obtained to evaluate the vascular anatomy. Multidetector CT imaging of the abdomen and pelvis was performed using the standard protocol during bolus administration of intravenous contrast. CONTRAST:  138m OMNIPAQUE IOHEXOL 350 MG/ML SOLN COMPARISON:  None. CT abdomen and pelvis November 13, 2018 1; chest CT March 18, 2020 FINDINGS: CTA CHEST  FINDINGS Cardiovascular: There is no evident pulmonary embolus. No thoracic aortic aneurysm or dissection. Visualized great vessels appear normal. No thoracic aortic aneurysm or dissection. Mediastinum/Nodes: Thyroid appears normal. Small amount of thymic tissue is normal for age. No evident thoracic adenopathy. No esophageal lesions evident. Lungs/Pleura: Interval clearing of infiltrate compared to prior CT. Lungs now clear. No pleural effusion. No pneumothorax. Trachea and major bronchial structures appear patent. Musculoskeletal: No blastic or lytic bone lesions. Review of the MIP images confirms the above findings. CT ABDOMEN and PELVIS FINDINGS Hepatobiliary: There is a 5 mm cyst in the anterior segment right lobe of the liver. No other focal liver lesions are evident. Gallbladder is absent. There is no appreciable biliary dilatation. Pancreas: There is no pancreatic mass or inflammatory focus. Spleen: No splenic lesions are evident. Adrenals/Urinary Tract: Adrenals bilaterally appear normal. There is no appreciable renal mass or hydronephrosis on either side. There is no evident renal or ureteral calculus on either side. Urinary bladder is midline with wall thickness within normal limits. Stomach/Bowel: There is no appreciable bowel wall or mesenteric thickening. Moderate stool noted in the colon. No bowel obstruction. Terminal ileum appears normal. No free air or portal venous air. Appendix absent. No periappendiceal region inflammation. Vascular/Lymphatic: No abdominal aortic aneurysm. No arterial vascular lesions are evident. Major venous structures appear patent. There is no evident adenopathy in the abdomen or pelvis. Reproductive: The uterus is anteverted.  No evident adnexal mass. Other: No abscess or ascites in the abdomen or pelvis. Musculoskeletal: No blastic or lytic bone lesions. No abdominal wall or intramuscular lesions. Review of the MIP images confirms the above findings. IMPRESSION: CT  angiogram chest: 1. No demonstrable pulmonary embolus. No thoracic aortic aneurysm or dissection. 2.  Lungs now clear. 3.  No adenopathy. CT abdomen and pelvis: 1. A cause for patient's symptoms has not been established with this study. 2. No bowel wall thickening or bowel obstruction. No abscess in the abdomen or pelvis. Appendix absent. 3.  Gallbladder absent. 4. No evident renal or ureteral calculus. No hydronephrosis. Urinary bladder wall thickness normal. Electronically Signed   By: WLowella GripIII M.D.   On: 04/04/2020 10:17   ECHOCARDIOGRAM COMPLETE  Result Date: 04/05/2020    ECHOCARDIOGRAM REPORT   Patient Name:   DLaurian Henry Date of Exam: 04/05/2020 Medical Rec #:  [id number]        Height:       62.0 in Accession #:    [id number]       Weight:       220.0 lb Date of Birth:  623-Feb-1997        BSA:          1.991 m Patient Age:    89years  BP:           128/75 mmHg Patient Gender: F                 HR:           85 bpm. Exam Location:  Inpatient Procedure: 2D Echo, Cardiac Doppler and Color Doppler Indications:    R06.02 SOB  History:        Patient has no prior history of Echocardiogram examinations.                 Signs/Symptoms:Shortness of Breath, Dyspnea and Chest Pain.                 Lupus.  Sonographer:    Roseanna Rainbow RDCS Referring Phys: [id number] MATTHEW R HUNSUCKER  Sonographer Comments: Technically difficult study due to poor echo windows and patient is morbidly obese. Image acquisition challenging due to patient body habitus. IMPRESSIONS  1. Left ventricular ejection fraction, by estimation, is 60 to 65%. The left ventricle has normal function. The left ventricle has no regional wall motion abnormalities. Left ventricular diastolic parameters were normal.  2. Right ventricular systolic function is normal. The right ventricular size is normal. Tricuspid regurgitation signal is inadequate for assessing PA pressure.  3. The mitral valve is normal in structure. No evidence of  mitral valve regurgitation. No evidence of mitral stenosis.  4. The aortic valve is normal in structure. Aortic valve regurgitation is not visualized. No aortic stenosis is present.  5. The inferior vena cava is normal in size with greater than 50% respiratory variability, suggesting right atrial pressure of 3 mmHg. FINDINGS  Left Ventricle: Left ventricular ejection fraction, by estimation, is 60 to 65%. The left ventricle has normal function. The left ventricle has no regional wall motion abnormalities. The left ventricular internal cavity size was normal in size. There is  borderline concentric left ventricular hypertrophy. Left ventricular diastolic parameters were normal. Normal left ventricular filling pressure. Right Ventricle: The right ventricular size is normal. No increase in right ventricular wall thickness. Right ventricular systolic function is normal. Tricuspid regurgitation signal is inadequate for assessing PA pressure. Left Atrium: Left atrial size was normal in size. Right Atrium: Right atrial size was normal in size. Pericardium: There is no evidence of pericardial effusion. Mitral Valve: The mitral valve is normal in structure. No evidence of mitral valve regurgitation. No evidence of mitral valve stenosis. Tricuspid Valve: The tricuspid valve is normal in structure. Tricuspid valve regurgitation is not demonstrated. No evidence of tricuspid stenosis. Aortic Valve: The aortic valve is normal in structure. Aortic valve regurgitation is not visualized. No aortic stenosis is present. Pulmonic Valve: The pulmonic valve was normal in structure. Pulmonic valve regurgitation is not visualized. No evidence of pulmonic stenosis. Aorta: The aortic root is normal in size and structure. Venous: The inferior vena cava is normal in size with greater than 50% respiratory variability, suggesting right atrial pressure of 3 mmHg. IAS/Shunts: No atrial level shunt detected by color flow Doppler.  LEFT VENTRICLE  PLAX 2D LVIDd:         3.76 cm     Diastology LVIDs:         2.47 cm     LV e' medial:    6.96 cm/s LV PW:         1.27 cm     LV E/e' medial:  13.0 LV IVS:        1.35 cm     LV e' lateral:  12.90 cm/s LVOT diam:     1.90 cm     LV E/e' lateral: 7.0 LV SV:         68 LV SV Index:   34 LVOT Area:     2.84 cm  LV Volumes (MOD) LV vol d, MOD A2C: 79.4 ml LV vol d, MOD A4C: 90.0 ml LV vol s, MOD A2C: 34.3 ml LV vol s, MOD A4C: 32.0 ml LV SV MOD A2C:     45.1 ml LV SV MOD A4C:     90.0 ml LV SV MOD BP:      51.9 ml RIGHT VENTRICLE             IVC RV S prime:     11.40 cm/s  IVC diam: 1.44 cm TAPSE (M-mode): 1.9 cm LEFT ATRIUM         Index LA diam:    3.40 cm 1.71 cm/m  AORTIC VALVE             PULMONIC VALVE LVOT Vmax:   122.00 cm/s PR End Diast Vel: 1.51 msec LVOT Vmean:  79.200 cm/s LVOT VTI:    0.239 m  AORTA Ao Root diam: 2.70 cm Ao Asc diam:  2.90 cm MITRAL VALVE MV Area (PHT): 6.32 cm    SHUNTS MV Decel Time: 120 msec    Systemic VTI:  0.24 m MV E velocity: 90.60 cm/s  Systemic Diam: 1.90 cm MV A velocity: 61.20 cm/s MV E/A ratio:  1.48 Mihai Croitoru MD Electronically signed by Sanda Klein MD Signature Date/Time: 04/05/2020/5:10:04 PM    Final     Microbiology: Recent Results (from the past 240 hour(s))  Resp Panel by RT-PCR (Flu A&B, Covid) Nasopharyngeal Swab     Status: None   Collection Time: 04/04/20 12:07 PM   Specimen: Nasopharyngeal Swab; Nasopharyngeal(NP) swabs in vial transport medium  Result Value Ref Range Status   SARS Coronavirus 2 by RT PCR NEGATIVE NEGATIVE Final    Comment: (NOTE) SARS-CoV-2 target nucleic acids are NOT DETECTED.  The SARS-CoV-2 RNA is generally detectable in upper respiratory specimens during the acute phase of infection. The lowest concentration of SARS-CoV-2 viral copies this assay can detect is 138 copies/mL. A negative result does not preclude SARS-Cov-2 infection and should not be used as the sole basis for treatment or other patient management  decisions. A negative result may occur with  improper specimen collection/handling, submission of specimen other than nasopharyngeal swab, presence of viral mutation(s) within the areas targeted by this assay, and inadequate number of viral copies(<138 copies/mL). A negative result must be combined with clinical observations, patient history, and epidemiological information. The expected result is Negative.  Fact Sheet for Patients:  EntrepreneurPulse.com.au  Fact Sheet for Healthcare Providers:  IncredibleEmployment.be  This test is no t yet approved or cleared by the [place] FDA and  has been authorized for detection and/or diagnosis of SARS-CoV-2 by FDA under an Emergency Use Authorization (EUA). This EUA will remain  in effect (meaning this test can be used) for the duration of the COVID-19 declaration under Section 564(b)(1) of the Act, 21 U.S.C.section 360bbb-3(b)(1), unless the authorization is terminated  or revoked sooner.       Influenza A by PCR NEGATIVE NEGATIVE Final   Influenza B by PCR NEGATIVE NEGATIVE Final    Comment: (NOTE) The Xpert Xpress SARS-CoV-2/FLU/RSV plus assay is intended as an aid in the diagnosis of influenza from Nasopharyngeal swab specimens and should not be used as a  sole basis for treatment. Nasal washings and aspirates are unacceptable for Xpert Xpress SARS-CoV-2/FLU/RSV testing.  Fact Sheet for Patients: EntrepreneurPulse.com.au  Fact Sheet for Healthcare Providers: IncredibleEmployment.be  This test is not yet approved or cleared by the [place] FDA and has been authorized for detection and/or diagnosis of SARS-CoV-2 by FDA under an Emergency Use Authorization (EUA). This EUA will remain in effect (meaning this test can be used) for the duration of the COVID-19 declaration under Section 564(b)(1) of the Act, 21 U.S.C. section 360bbb-3(b)(1), unless the  authorization is terminated or revoked.  Performed at [facility], [place] [address], [place], [place] [address]      Labs: Basic Metabolic Panel: Recent Labs  Lab 04/04/20 0500 04/05/20 0340 04/06/20 0341  NA 138 138 137  K 3.9 3.5 3.6  CL 108 106 105  CO2 21* 21* 20*  GLUCOSE 114* 100* 113*  BUN 11 9 7   CREATININE 0.73 0.80 0.72  CALCIUM 9.4 8.8* 9.3   Liver Function Tests: Recent Labs  Lab 04/04/20 0500 04/05/20 0340 04/06/20 0341  AST 19 18 25   ALT 13 13 18   ALKPHOS 65 60 62  BILITOT 0.2* 0.5 0.8  PROT 7.1 6.6 6.8  ALBUMIN 3.6 3.6 3.6   Recent Labs  Lab 04/04/20 0500  LIPASE 39   No results for input(s): AMMONIA in the last 168 hours. CBC: Recent Labs  Lab 04/04/20 0500 04/05/20 0340 04/06/20 0341  WBC 10.4 11.1* 12.2*  HGB 11.2* 10.2* 10.6*  HCT 35.6* 32.8* 33.9*  MCV 71.9* 73.9* 73.2*  PLT 465* 408* 464*       Signed:  Oswald Hillock MD.  [facility] 04/06/2020, 1:57 PM

## 2020-04-07 NOTE — Telephone Encounter (Signed)
She should follow advice of urgen care doc she saw on 04/03/20 and she should keep upcoming appt iwht APP next week.Any issues in between go to ER

## 2020-04-08 NOTE — Telephone Encounter (Signed)
Pt was admitted to the hospital 04/04/20-04/06/20. Pt has a f/u scheduled 04/12/20 with Derl Barrow, NP. Nothing further needed.

## 2020-04-12 ENCOUNTER — Ambulatory Visit: Payer: PPO | Admitting: Primary Care

## 2020-04-12 NOTE — Progress Notes (Deleted)
Office Visit Note  Patient: [name]             Date of Birth: Apr 09, 1995           MRN: [id number]             PCP: [facility] Visit Date: 04/13/2020   Subjective:  No chief complaint on file.   History of Present Illness: [name] is a 25 y.o. female here for follow up of SLE currently on hydroxychloroquine 460m daily. Most recently she keeps having a lot of pulmonary symptoms since COVID infection earlier this year, and was seen at the hospital again 3/14 for this. Steroid treatment was not very clear that it improved symptoms much, although did raise blood sugar a lot.***     No Rheumatology ROS completed.   Previous HPI: Cassandra Piotrowskiis a 25y.o. female with a history of cerliac disease here for evaluation and treatment of systemic lupus. She was originally diagnosed with lupus in 2014 and has been treated with methotrexate, which she did not tolerate, steroids, which caused her a lot of side effects including 100 lbs weight gain and increased hospitalizations with infection, and plaquenil which she took for multiple years. She first suffered a pulmonary embolism in 2015 and took xarelto for a period of time. [place] back and forth. She was [place] by rheumatology [place]. [place]ita [place] since January of this year she developed a new PE and was started on Eliquis for this. She has had multiple recent hospital admissions with vomiting, and repeated upper respiratory symptoms and with hemoptysis. In February she was found to have COVID and treated outpatient. She saw pulmonology clinic and has had several lab studies drawn for concern of active lupus contributing to respiratory symptoms and clotting. Currently she is feeling poorly most significantly due to the persistent coughing and chest pain associated  with this. She is checking her pulse oximetry at home. 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 She also has generalized symptoms with fatigue and joint pains.  Previous lupus manifestations Alopecia Malar rash and photosensitive peripheral rash Eye inflammation Nasal ulcers Lymphadenopathy Raynaud's Multiple PEs Gastritis/GI inflammation  DMARD HX MTX - GI intolerance HCQ - Current 4020mdaily Prednisone- hyperglycemia with admissions  Labs reviewed 10/2019 Vitamin D 11.42 PTH 92.50 ESR 42 TSH 1.111 T3 wnl fT4 wnl ANA neg RF neg CBC Hgb 11.0, Plts 436 Ferritin 17.2  02/2018 ANA neg dsDNA neg C3 C4 normal LAC neg CBC counts wnl, MCV 74.9  08/2013 HBV neg HCV neg  PMFS History:  Patient Active Problem List   Diagnosis Date Noted  . Obesity, Class III, BMI 40-49.9 (morbid obesity) ([facility]03/15/2022  . SOB (shortness of breath) 04/05/2020  . Abdominal pain 04/04/2020  . SLE (systemic lupus erythematosus related syndrome) ([facility]03/14/2022  . Shortness of breath 04/04/2020  . Pneumonia 03/23/2020  . Chronic nausea 03/23/2020  . Asthma 03/22/2020  . Dysuria 02/03/2020  . Hematuria 02/03/2020  . Mass of urinary bladder 01/26/2020  . Allergic rhinitis 05/05/2019  . 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 Endometriosis 03/14/2018  . Long-term use of high-risk medication 07/02/2017  . Lupus anticoagulant positive 07/02/2017  . Personal history of pulmonary embolism 07/02/2017  . Other forms of systemic lupus erythematosus ([facility]  03/12/2017  . Anemia 02/28/2017  . Migraine 02/28/2017  . Dysmenorrhea 08/28/2011  . Gastritis 08/28/2011    Past Medical History:  Diagnosis Date  . Asthma   . Celiac disease   . Collagen vascular disease ([place])   . Diarrhea    Patient mentions diagnosis of ulcerative colitis but it is not clear she actually has UC  . Endometriosis   . Lupus ([place])   . Ovarian cyst   . Pulmonary  embolus ([place]) 02/06/2020    Family History  Problem Relation Age of Onset  . Hypertension Mother   . Hypercholesterolemia Mother   . Rheum arthritis Mother   . Diabetes Father   . Hypertension Father   . Cancer Father   . Autism Brother    Past Surgical History:  Procedure Laterality Date  . APPENDECTOMY    . CHOLECYSTECTOMY    . EXCISION OF ENDOMETRIOMA     Ovarian cyst removal  . FOOT FRACTURE SURGERY     w/ hardware  . HERNIA REPAIR    . TONSILLECTOMY     Social History   Social History Narrative  . Not on file   Immunization History  Administered Date(s) Administered  . Influenza, Quadrivalent, Recombinant, Inj, Pf 11/08/2018  . Influenza,inj,Quad PF,6+ Mos 10/22/2016  . Influenza,inj,quad, With Preservative 11/23/2019  . Influenza-Unspecified 12/30/2019  . Tdap 01/23/2011  . Unspecified SARS-COV-2 Vaccination 03/23/2019  . Varicella 12/15/2019     Objective: Vital Signs: LMP  (LMP Unknown) Comment: cotinuous birth control no periods, pregnancy test negative    Physical Exam   Musculoskeletal Exam: ***  CDAI Exam: CDAI Score: - Patient Global: -; Provider Global: - Swollen: -; Tender: - Joint Exam 04/13/2020   No joint exam has been documented for this visit   There is currently no information documented on the homunculus. Go to the Rheumatology activity and complete the homunculus joint exam.  Investigation: No additional findings.  Imaging: DG Chest 2 View  Result Date: 04/04/2020 CLINICAL DATA:  Shortness of breath.  COVID induced pneumonia. EXAM: CHEST - 2 VIEW COMPARISON:  Chest x-ray 03/01/2020, CT chest 03/18/2020 FINDINGS: The heart size and mediastinal contours are within normal limits. No focal consolidation. No pulmonary edema. No pleural effusion. No pneumothorax. No acute osseous abnormality. IMPRESSION: No active cardiopulmonary disease. Electronically Signed   By: Iven Finn M.D.   On: 04/04/2020 05:23   CT Angio Chest PE W  and/or Wo Contrast  Result Date: 04/04/2020 CLINICAL DATA:  Chest pain.  Abdomen pain. EXAM: CT ANGIOGRAPHY CHEST CT ABDOMEN AND PELVIS WITH CONTRAST TECHNIQUE: Multidetector CT imaging of the chest was performed using the standard protocol during bolus administration of intravenous contrast. Multiplanar CT image reconstructions and MIPs were obtained to evaluate the vascular anatomy. Multidetector CT imaging of the abdomen and pelvis was performed using the standard protocol during bolus administration of intravenous contrast. CONTRAST:  186m OMNIPAQUE IOHEXOL 350 MG/ML SOLN COMPARISON:  None. CT abdomen and pelvis November 13, 2018 1; chest CT March 18, 2020 FINDINGS: CTA CHEST FINDINGS Cardiovascular: There is no evident pulmonary embolus. No thoracic aortic aneurysm or dissection. Visualized great vessels appear normal. No thoracic aortic aneurysm or dissection. Mediastinum/Nodes: Thyroid appears normal. Small amount of thymic tissue is normal for age. No evident thoracic adenopathy. No esophageal lesions evident. Lungs/Pleura: Interval clearing of infiltrate compared to prior CT. Lungs now clear. No pleural effusion. No pneumothorax. Trachea and major bronchial structures appear patent. Musculoskeletal: No blastic or lytic bone lesions. Review of  the MIP images confirms the above findings. CT ABDOMEN and PELVIS FINDINGS Hepatobiliary: There is a 5 mm cyst in the anterior segment right lobe of the liver. No other focal liver lesions are evident. Gallbladder is absent. There is no appreciable biliary dilatation. Pancreas: There is no pancreatic mass or inflammatory focus. Spleen: No splenic lesions are evident. Adrenals/Urinary Tract: Adrenals bilaterally appear normal. There is no appreciable renal mass or hydronephrosis on either side. There is no evident renal or ureteral calculus on either side. Urinary bladder is midline with wall thickness within normal limits. Stomach/Bowel: There is no appreciable  bowel wall or mesenteric thickening. Moderate stool noted in the colon. No bowel obstruction. Terminal ileum appears normal. No free air or portal venous air. Appendix absent. No periappendiceal region inflammation. Vascular/Lymphatic: No abdominal aortic aneurysm. No arterial vascular lesions are evident. Major venous structures appear patent. There is no evident adenopathy in the abdomen or pelvis. Reproductive: The uterus is anteverted.  No evident adnexal mass. Other: No abscess or ascites in the abdomen or pelvis. Musculoskeletal: No blastic or lytic bone lesions. No abdominal wall or intramuscular lesions. Review of the MIP images confirms the above findings. IMPRESSION: CT angiogram chest: 1. No demonstrable pulmonary embolus. No thoracic aortic aneurysm or dissection. 2.  Lungs now clear. 3.  No adenopathy. CT abdomen and pelvis: 1. A cause for patient's symptoms has not been established with this study. 2. No bowel wall thickening or bowel obstruction. No abscess in the abdomen or pelvis. Appendix absent. 3.  Gallbladder absent. 4. No evident renal or ureteral calculus. No hydronephrosis. Urinary bladder wall thickness normal. Electronically Signed   By: Lowella Grip III M.D.   On: 04/04/2020 10:17   CT Angio Chest PE W/Cm &/Or Wo Cm  Result Date: 03/18/2020 CLINICAL DATA:  Worsening shortness of breath and chest pain today. History pulmonary embolism on chronic anticoagulation. Not recently taking anticoagulation medications. EXAM: CT ANGIOGRAPHY CHEST WITH CONTRAST TECHNIQUE: Multidetector CT imaging of the chest was performed using the standard protocol during bolus administration of intravenous contrast. Multiplanar CT image reconstructions and MIPs were obtained to evaluate the vascular anatomy. CONTRAST:  175m OMNIPAQUE IOHEXOL 350 MG/ML SOLN COMPARISON:  Chest radiographs 03/01/2020. Abdominopelvic CT 11/13/2019. No prior chest CT available. FINDINGS: Cardiovascular: The pulmonary arteries  are adequately but not optimally opacified with contrast to the level of the subsegmental branches. There is no evidence of acute pulmonary embolism. No evidence of acute systemic arterial abnormality. The heart size is normal. There is no pericardial effusion. Mediastinum/Nodes: There are no enlarged mediastinal, hilar or axillary lymph nodes.Prevascular soft tissue most consistent with residual thymic tissue. The thyroid gland, trachea and esophagus demonstrate no significant findings. Lungs/Pleura: No pleural effusion or pneumothorax. There are patchy airspace opacities in both lungs. Dependent components in both lower lobes likely represent atelectasis. There is a more diffuse component in the left upper lobe which could reflect infection. There is a smaller dependent component posteriorly in the right upper lobe. No consolidation or suspicious pulmonary nodularity. Upper abdomen: No significant findings are seen within the visualized upper abdomen. There is a probable cyst in the dome of the liver which appears unchanged. Previous cholecystectomy. Musculoskeletal/Chest wall: There is no chest wall mass or suspicious osseous finding. Review of the MIP images confirms the above findings. IMPRESSION: 1. No evidence of acute pulmonary embolism or other acute vascular findings. 2. Patchy airspace opacities in both lungs with a more diffuse component in the left upper lobe which could  reflect infection. Dependent components in both lower lobes likely represent atelectasis. Recommend radiographic follow-up. Electronically Signed   By: Richardean Sale M.D.   On: 03/18/2020 19:14   CT ABDOMEN PELVIS W CONTRAST  Result Date: 04/04/2020 CLINICAL DATA:  Chest pain.  Abdomen pain. EXAM: CT ANGIOGRAPHY CHEST CT ABDOMEN AND PELVIS WITH CONTRAST TECHNIQUE: Multidetector CT imaging of the chest was performed using the standard protocol during bolus administration of intravenous contrast. Multiplanar CT image reconstructions  and MIPs were obtained to evaluate the vascular anatomy. Multidetector CT imaging of the abdomen and pelvis was performed using the standard protocol during bolus administration of intravenous contrast. CONTRAST:  161m OMNIPAQUE IOHEXOL 350 MG/ML SOLN COMPARISON:  None. CT abdomen and pelvis November 13, 2018 1; chest CT March 18, 2020 FINDINGS: CTA CHEST FINDINGS Cardiovascular: There is no evident pulmonary embolus. No thoracic aortic aneurysm or dissection. Visualized great vessels appear normal. No thoracic aortic aneurysm or dissection. Mediastinum/Nodes: Thyroid appears normal. Small amount of thymic tissue is normal for age. No evident thoracic adenopathy. No esophageal lesions evident. Lungs/Pleura: Interval clearing of infiltrate compared to prior CT. Lungs now clear. No pleural effusion. No pneumothorax. Trachea and major bronchial structures appear patent. Musculoskeletal: No blastic or lytic bone lesions. Review of the MIP images confirms the above findings. CT ABDOMEN and PELVIS FINDINGS Hepatobiliary: There is a 5 mm cyst in the anterior segment right lobe of the liver. No other focal liver lesions are evident. Gallbladder is absent. There is no appreciable biliary dilatation. Pancreas: There is no pancreatic mass or inflammatory focus. Spleen: No splenic lesions are evident. Adrenals/Urinary Tract: Adrenals bilaterally appear normal. There is no appreciable renal mass or hydronephrosis on either side. There is no evident renal or ureteral calculus on either side. Urinary bladder is midline with wall thickness within normal limits. Stomach/Bowel: There is no appreciable bowel wall or mesenteric thickening. Moderate stool noted in the colon. No bowel obstruction. Terminal ileum appears normal. No free air or portal venous air. Appendix absent. No periappendiceal region inflammation. Vascular/Lymphatic: No abdominal aortic aneurysm. No arterial vascular lesions are evident. Major venous structures  appear patent. There is no evident adenopathy in the abdomen or pelvis. Reproductive: The uterus is anteverted.  No evident adnexal mass. Other: No abscess or ascites in the abdomen or pelvis. Musculoskeletal: No blastic or lytic bone lesions. No abdominal wall or intramuscular lesions. Review of the MIP images confirms the above findings. IMPRESSION: CT angiogram chest: 1. No demonstrable pulmonary embolus. No thoracic aortic aneurysm or dissection. 2.  Lungs now clear. 3.  No adenopathy. CT abdomen and pelvis: 1. A cause for patient's symptoms has not been established with this study. 2. No bowel wall thickening or bowel obstruction. No abscess in the abdomen or pelvis. Appendix absent. 3.  Gallbladder absent. 4. No evident renal or ureteral calculus. No hydronephrosis. Urinary bladder wall thickness normal. Electronically Signed   By: WLowella GripIII M.D.   On: 04/04/2020 10:17   ECHOCARDIOGRAM COMPLETE  Result Date: 04/05/2020    ECHOCARDIOGRAM REPORT   Patient Name:   [name] Date of Exam: 04/05/2020 Medical Rec #:  [id number]        Height:       62.0 in Accession #:    [id number]       Weight:       220.0 lb Date of Birth:  [birth year]        BSA:  1.991 m Patient Age:    24 years          BP:           128/75 mmHg Patient Gender: F                 HR:           85 bpm. Exam Location:  Inpatient Procedure: 2D Echo, Cardiac Doppler and Color Doppler Indications:    R06.02 SOB  History:        Patient has no prior history of Echocardiogram examinations.                 Signs/Symptoms:Shortness of Breath, Dyspnea and Chest Pain.                 Lupus.  Sonographer:    Roseanna Rainbow RDCS Referring Phys: [phone] MATTHEW R HUNSUCKER  Sonographer Comments: Technically difficult study due to poor echo windows and patient is morbidly obese. Image acquisition challenging due to patient body habitus. IMPRESSIONS  1. Left ventricular ejection fraction, by estimation, is 60 to 65%. The left  ventricle has normal function. The left ventricle has no regional wall motion abnormalities. Left ventricular diastolic parameters were normal.  2. Right ventricular systolic function is normal. The right ventricular size is normal. Tricuspid regurgitation signal is inadequate for assessing PA pressure.  3. The mitral valve is normal in structure. No evidence of mitral valve regurgitation. No evidence of mitral stenosis.  4. The aortic valve is normal in structure. Aortic valve regurgitation is not visualized. No aortic stenosis is present.  5. The inferior vena cava is normal in size with greater than 50% respiratory variability, suggesting right atrial pressure of 3 mmHg. FINDINGS  Left Ventricle: Left ventricular ejection fraction, by estimation, is 60 to 65%. The left ventricle has normal function. The left ventricle has no regional wall motion abnormalities. The left ventricular internal cavity size was normal in size. There is  borderline concentric left ventricular hypertrophy. Left ventricular diastolic parameters were normal. Normal left ventricular filling pressure. Right Ventricle: The right ventricular size is normal. No increase in right ventricular wall thickness. Right ventricular systolic function is normal. Tricuspid regurgitation signal is inadequate for assessing PA pressure. Left Atrium: Left atrial size was normal in size. Right Atrium: Right atrial size was normal in size. Pericardium: There is no evidence of pericardial effusion. Mitral Valve: The mitral valve is normal in structure. No evidence of mitral valve regurgitation. No evidence of mitral valve stenosis. Tricuspid Valve: The tricuspid valve is normal in structure. Tricuspid valve regurgitation is not demonstrated. No evidence of tricuspid stenosis. Aortic Valve: The aortic valve is normal in structure. Aortic valve regurgitation is not visualized. No aortic stenosis is present. Pulmonic Valve: The pulmonic valve was normal in  structure. Pulmonic valve regurgitation is not visualized. No evidence of pulmonic stenosis. Aorta: The aortic root is normal in size and structure. Venous: The inferior vena cava is normal in size with greater than 50% respiratory variability, suggesting right atrial pressure of 3 mmHg. IAS/Shunts: No atrial level shunt detected by color flow Doppler.  LEFT VENTRICLE PLAX 2D LVIDd:         3.76 cm     Diastology LVIDs:         2.47 cm     LV e' medial:    6.96 cm/s LV PW:         1.27 cm     LV E/e' medial:  13.0  LV IVS:        1.35 cm     LV e' lateral:   12.90 cm/s LVOT diam:     1.90 cm     LV E/e' lateral: 7.0 LV SV:         68 LV SV Index:   34 LVOT Area:     2.84 cm  LV Volumes (MOD) LV vol d, MOD A2C: 79.4 ml LV vol d, MOD A4C: 90.0 ml LV vol s, MOD A2C: 34.3 ml LV vol s, MOD A4C: 32.0 ml LV SV MOD A2C:     45.1 ml LV SV MOD A4C:     90.0 ml LV SV MOD BP:      51.9 ml RIGHT VENTRICLE             IVC RV S prime:     11.40 cm/s  IVC diam: 1.44 cm TAPSE (M-mode): 1.9 cm LEFT ATRIUM         Index LA diam:    3.40 cm 1.71 cm/m  AORTIC VALVE             PULMONIC VALVE LVOT Vmax:   122.00 cm/s PR End Diast Vel: 1.51 msec LVOT Vmean:  79.200 cm/s LVOT VTI:    0.239 m  AORTA Ao Root diam: 2.70 cm Ao Asc diam:  2.90 cm MITRAL VALVE MV Area (PHT): 6.32 cm    SHUNTS MV Decel Time: 120 msec    Systemic VTI:  0.24 m MV E velocity: 90.60 cm/s  Systemic Diam: 1.90 cm MV A velocity: 61.20 cm/s MV E/A ratio:  1.48 Mihai Croitoru MD Electronically signed by Sanda Klein MD Signature Date/Time: 04/05/2020/5:10:04 PM    Final     Recent Labs: Lab Results  Component Value Date   WBC 12.2 (H) 04/06/2020   HGB 10.6 (L) 04/06/2020   PLT 464 (H) 04/06/2020   NA 137 04/06/2020   K 3.6 04/06/2020   CL 105 04/06/2020   CO2 20 (L) 04/06/2020   GLUCOSE 113 (H) 04/06/2020   BUN 7 04/06/2020   CREATININE 0.72 04/06/2020   BILITOT 0.8 04/06/2020   ALKPHOS 62 04/06/2020   AST 25 04/06/2020   ALT 18 04/06/2020   PROT  6.8 04/06/2020   ALBUMIN 3.6 04/06/2020   CALCIUM 9.3 04/06/2020   GFRAA >60 06/18/2019   QFTBGOLDPLUS NEGATIVE 03/22/2020    Speciality Comments: No specialty comments available.  Procedures:  No procedures performed Allergies: Azithromycin, Peanut-containing drug products, Iodinated diagnostic agents, Other, Amoxicillin, Fish allergy, Gluten meal, Hydromet [hydrocodone-homatropine], Ibuprofen, and Pantoprazole   Assessment / Plan:     Visit Diagnoses: No diagnosis found.  ***  Orders: No orders of the defined types were placed in this encounter.  No orders of the defined types were placed in this encounter.    Follow-Up Instructions: No follow-ups on file.   Collier Salina, MD  Note - This record has been created using [employer].  Chart creation errors have been sought, but may not always  have been located. Such creation errors do not reflect on  the standard of medical care.

## 2020-04-12 NOTE — Progress Notes (Deleted)
@Patient  ID: [name], female    DOB: May 20, 1995, 25 y.o.   MRN: [id number]  No chief complaint on file.   Referring provider: [facility]  HPI: 25 year old female, never smoked.  Past medical history significant for Covid in February 2022, pneumonia, asthma, allergic rhinitis, migraine headaches, gastritis, celiac disease (other genetic testing is negative), systemic lupus erythematosus (on Plaquenil), second PE in Jan 2021 .  Patient Dr. Chase Caller, seen for initial consult on 03/22/2020.  Previous [facility] pulmonary encounter 03/22/20 [name] 25 y.o. -complicated history.  Patient provides the history along with review of the medical records.  Patient is originally from [place].  She moved to [place] few years ago to attend [employer] school.  She tells me that at the age of 70 which was 7 years ago in 2014 she was diagnosed with lupus.  She is to see a rheumatologist in the [place] area.  She does not know the name.  She was on methotrexate for a few years but then quit taking it 2 years ago when she moved from [place] to [place] in 2020.  She has been on Plaquenil all along.  She cannot tolerate prednisone because of hyperglycemia and admissions.  She is very reluctant to do steroids.  She prefers to be on methotrexate she has an appointment pending with the rheumatologist here according to history.  Then the year 2015 she developed a first pulmonary embolism and was on Xarelto for 3 years.  She says she took it in a diligent fashion till 2018 and then was instructed to stop.  Then in 2020 she moved to [place].  She has been attending school here.  She has been on Plaquenil.  She was doing well and then in the beginning of December 2021 she went back to [place] this time the [place] area to be with the family.  Then in early January 2022 she had hematuria.  Was admitted at [facility].  Apparently she was diagnosed with early-stage  precancerous bladder lesion.  She was then discharged with recommendations for observation therapy.  1 day later she got admitted.  She shows me the report from [facility] February 07, 2020 that shows CT diagnosis of pulmonary embolism in the right interlobar artery right upper lobe and the right lower lobe segmental branches.  She was then started on Eliquis.  She then returned to [facility].  She is only been taking Eliquis half the time.  Then approximately 2 weeks ago in the middle of February 2022 she was diagnosed with COVID-19 in the college campus.  O micron.  Treated as an outpatient.  Then end of last week she started getting cough with hemoptysis.  On 03/18/2020 she checked herself into the emergency department labs reviewed.  No D-dimer checked.  She had a CT angiogram pulmonary embolism protocol.  I personally visualized the CT no pulmonary embolism but she has new onset pulmonary infiltrates [we have imaging comparison to a few years ago for CT abdomen lung images -that back then did not show any infiltrates].  She was discharged on doxycycline which has been taking the cough is still persistent but only somewhat better.   Resting pulse ox 98% resting heart rate 123/min.  We attempted to walk 3 laps but she she currently complete 2 laps.  Final pulse ox 97% final heart rate 116.  Slow pace she was dyspneic and cough throughout.  She had to be dizzy and had  to sit down at the first lap.  Walking desaturation test done with a finger probe.  04/12/2020-  3-week follow-up Patient had Covid in February 2022 treated outpatient.  On 03/18/2020 she presented to emergency room with hemoptysis, CT showed patchy groundglass opacities.  Patient completed course of doxycycline.  Suspicion for active lupus versus Covid flare causing pulmonary inflammation.  She has a history of recurrent pulmonary embolisms, second occurrence in January 2021.  She is inconsistently taking Eliquis.  Dr. Chase Caller  recommended conservative management, closely monitor over the next several weeks until we get a clear picture.  Patient was admitted to [facility] for 2 days on 04/04/2020. She was started on a prednisone taper outpatient several days after office visit with Dr. Chase Caller.  Prednisone elevated her blood sugars and patient reports feeling unwell with foggy mental status.  She is unsure if prednisone helps her breathing.  Completed prednisone taper however cough worsened.  This prompted patient to request a refill of Tussionex for both urgent care and on-call pulmonary physician instructed patient to go to ED.  T abdomen pelvis and chest without abnormality.  Chest portion revealed clear lungs bilaterally with resolution of prior groundglass opacities and no other infiltrate.  No interstitial changes or pleural effusions.  Dyspnea felt to be related to recent COVID-19 infection.  Patient no longer requiring oxygen.  Patient off prednisone.  CT chest clear.  Pulmonary signed off.  Continue nebulized Yupelri once daily, budesonide twice daily and a formoterol twice daily.  No need for bronchoscopy.    Labs: Quantiferon gold negative 3 and C4 mildly elevated IGE 42, eosinophils absolute 200 Lupus anticoagulant panel within normal range  Sjogrens, ANA and RF negative SARS-CoV-2 1 antibody less than 1.00 meaning no antibodies were detected blood sample  Cardiac test: Echocardiogram showed a normal ejection fraction, EF 60 to 65%.  Left ventricle with normal function and diastolic parameters.  Pulmonary function testing commended 90 days after Covid infection, this would be tentatively scheduled for May 2022.  Allergies  Allergen Reactions  . Azithromycin Anaphylaxis  . Peanut-Containing Drug Products Other (See Comments)    Unknown ; treenuts, shell fish Unknown reaction, took allergy test  . Iodinated Diagnostic Agents Hives and Itching    02/04/2020 Patient stated has history of itching and  hives with CT IV Iodine contrast, premedicated with Benadryl 25 mg IV prior to CT IV Iodine scan today.Post CT scan patient reported itching, Benadryl 25 mg IV given, symptoms resolved, NP recommends for patient to get premedicated with Benadryl 50 mg IV prior to future CT IV Iodine scans.   . Other Other (See Comments)    Moderna covid vaccine -Syncope   . Amoxicillin Swelling    Hand swelling   . Fish Allergy Other (See Comments)    Unknown Allergy test   . Gluten Meal Other (See Comments)    Celiac disease  . Hydromet [Hydrocodone-Homatropine] Nausea And Vomiting  . Ibuprofen Nausea And Vomiting  . Pantoprazole Other (See Comments)    Stomach pain    Immunization History  Administered Date(s) Administered  . Influenza, Quadrivalent, Recombinant, Inj, Pf 11/08/2018  . Influenza,inj,Quad PF,6+ Mos 10/22/2016  . Influenza,inj,quad, With Preservative 11/23/2019  . Influenza-Unspecified 12/30/2019  . Tdap 01/23/2011  . Unspecified SARS-COV-2 Vaccination 03/23/2019  . Varicella 12/15/2019    Past Medical History:  Diagnosis Date  . Asthma   . Celiac disease   . Collagen vascular disease ([place])   . Diarrhea    Patient mentions  diagnosis of ulcerative colitis but it is not clear she actually has UC  . Endometriosis   . Lupus ([place])   . Ovarian cyst   . Pulmonary embolus ([place]) 02/06/2020    Tobacco History: Social History   Tobacco Use  Smoking Status Never Smoker  Smokeless Tobacco Never Used   Counseling given: Not Answered   Outpatient Medications Prior to Visit  Medication Sig Dispense Refill  . albuterol (PROVENTIL) (2.5 MG/3ML) 0.083% nebulizer solution Take 3 mLs (2.5 mg total) by nebulization every 6 (six) hours as needed for wheezing or shortness of breath. 75 mL 12  . ALPRAZolam (XANAX) 0.5 MG tablet Take 0.25-0.5 mg by mouth daily as needed for anxiety.    Marland Kitchen apixaban (ELIQUIS) 5 MG TABS tablet Take 5 mg by mouth 2 (two) times daily.    Marland Kitchen arformoterol  (BROVANA) 15 MCG/2ML NEBU Take 2 mLs (15 mcg total) by nebulization 2 (two) times daily. 120 mL 1  . budesonide (PULMICORT) 0.5 MG/2ML nebulizer solution Take 2 mLs (0.5 mg total) by nebulization 2 (two) times daily. 100 mL 3  . hydroxychloroquine (PLAQUENIL) 200 MG tablet Take 2 tablets (400 mg total) by mouth daily. 180 tablet 0  . Norethindrone-Ethinyl Estradiol-Fe Biphas (LO LOESTRIN FE) 1 MG-10 MCG / 10 MCG tablet Take 1 tablet by mouth daily.    . pantoprazole (PROTONIX) 40 MG tablet Take 1 tablet (40 mg total) by mouth daily at 6 (six) AM. 30 tablet 2  . revefenacin (YUPELRI) 175 MCG/3ML nebulizer solution Take 3 mLs (175 mcg total) by nebulization daily. 90 mL 1  . sertraline (ZOLOFT) 100 MG tablet Take 200 mg by mouth at bedtime.    . sucralfate (CARAFATE) 1 GM/10ML suspension Take 10 mLs (1 g total) by mouth 2 (two) times daily. 420 mL 0   No facility-administered medications prior to visit.      Review of Systems  Review of Systems   Physical Exam  LMP  (LMP Unknown) Comment: cotinuous birth control no periods, pregnancy test negative  Physical Exam   Lab Results:  CBC    Component Value Date/Time   WBC 12.2 (H) 04/06/2020 0341   RBC 4.63 04/06/2020 0341   HGB 10.6 (L) 04/06/2020 0341   HCT 33.9 (L) 04/06/2020 0341   PLT 464 (H) 04/06/2020 0341   MCV 73.2 (L) 04/06/2020 0341   MCH 22.9 (L) 04/06/2020 0341   MCHC 31.3 04/06/2020 0341   RDW 16.5 (H) 04/06/2020 0341   LYMPHSABS 2.6 03/22/2020 1524   MONOABS 0.5 03/22/2020 1524   EOSABS 0.2 03/22/2020 1524   BASOSABS 0.0 03/22/2020 1524    BMET    Component Value Date/Time   NA 137 04/06/2020 0341   K 3.6 04/06/2020 0341   CL 105 04/06/2020 0341   CO2 20 (L) 04/06/2020 0341   GLUCOSE 113 (H) 04/06/2020 0341   BUN 7 04/06/2020 0341   CREATININE 0.72 04/06/2020 0341   CALCIUM 9.3 04/06/2020 0341   GFRNONAA >60 04/06/2020 0341   GFRAA >60 06/18/2019 1440    BNP No results found for: BNP  ProBNP No  results found for: PROBNP  Imaging: DG Chest 2 View  Result Date: 04/04/2020 CLINICAL DATA:  Shortness of breath.  COVID induced pneumonia. EXAM: CHEST - 2 VIEW COMPARISON:  Chest x-ray 03/01/2020, CT chest 03/18/2020 FINDINGS: The heart size and mediastinal contours are within normal limits. No focal consolidation. No pulmonary edema. No pleural effusion. No pneumothorax. No acute osseous abnormality. IMPRESSION: No active  cardiopulmonary disease. Electronically Signed   By: Iven Finn M.D.   On: 04/04/2020 05:23   CT Angio Chest PE W and/or Wo Contrast  Result Date: 04/04/2020 CLINICAL DATA:  Chest pain.  Abdomen pain. EXAM: CT ANGIOGRAPHY CHEST CT ABDOMEN AND PELVIS WITH CONTRAST TECHNIQUE: Multidetector CT imaging of the chest was performed using the standard protocol during bolus administration of intravenous contrast. Multiplanar CT image reconstructions and MIPs were obtained to evaluate the vascular anatomy. Multidetector CT imaging of the abdomen and pelvis was performed using the standard protocol during bolus administration of intravenous contrast. CONTRAST:  192mL OMNIPAQUE IOHEXOL 350 MG/ML SOLN COMPARISON:  None. CT abdomen and pelvis November 13, 2018 1; chest CT March 18, 2020 FINDINGS: CTA CHEST FINDINGS Cardiovascular: There is no evident pulmonary embolus. No thoracic aortic aneurysm or dissection. Visualized great vessels appear normal. No thoracic aortic aneurysm or dissection. Mediastinum/Nodes: Thyroid appears normal. Small amount of thymic tissue is normal for age. No evident thoracic adenopathy. No esophageal lesions evident. Lungs/Pleura: Interval clearing of infiltrate compared to prior CT. Lungs now clear. No pleural effusion. No pneumothorax. Trachea and major bronchial structures appear patent. Musculoskeletal: No blastic or lytic bone lesions. Review of the MIP images confirms the above findings. CT ABDOMEN and PELVIS FINDINGS Hepatobiliary: There is a 5 mm cyst in the  anterior segment right lobe of the liver. No other focal liver lesions are evident. Gallbladder is absent. There is no appreciable biliary dilatation. Pancreas: There is no pancreatic mass or inflammatory focus. Spleen: No splenic lesions are evident. Adrenals/Urinary Tract: Adrenals bilaterally appear normal. There is no appreciable renal mass or hydronephrosis on either side. There is no evident renal or ureteral calculus on either side. Urinary bladder is midline with wall thickness within normal limits. Stomach/Bowel: There is no appreciable bowel wall or mesenteric thickening. Moderate stool noted in the colon. No bowel obstruction. Terminal ileum appears normal. No free air or portal venous air. Appendix absent. No periappendiceal region inflammation. Vascular/Lymphatic: No abdominal aortic aneurysm. No arterial vascular lesions are evident. Major venous structures appear patent. There is no evident adenopathy in the abdomen or pelvis. Reproductive: The uterus is anteverted.  No evident adnexal mass. Other: No abscess or ascites in the abdomen or pelvis. Musculoskeletal: No blastic or lytic bone lesions. No abdominal wall or intramuscular lesions. Review of the MIP images confirms the above findings. IMPRESSION: CT angiogram chest: 1. No demonstrable pulmonary embolus. No thoracic aortic aneurysm or dissection. 2.  Lungs now clear. 3.  No adenopathy. CT abdomen and pelvis: 1. A cause for patient's symptoms has not been established with this study. 2. No bowel wall thickening or bowel obstruction. No abscess in the abdomen or pelvis. Appendix absent. 3.  Gallbladder absent. 4. No evident renal or ureteral calculus. No hydronephrosis. Urinary bladder wall thickness normal. Electronically Signed   By: Lowella Grip III M.D.   On: 04/04/2020 10:17   CT Angio Chest PE W/Cm &/Or Wo Cm  Result Date: 03/18/2020 CLINICAL DATA:  Worsening shortness of breath and chest pain today. History pulmonary embolism on  chronic anticoagulation. Not recently taking anticoagulation medications. EXAM: CT ANGIOGRAPHY CHEST WITH CONTRAST TECHNIQUE: Multidetector CT imaging of the chest was performed using the standard protocol during bolus administration of intravenous contrast. Multiplanar CT image reconstructions and MIPs were obtained to evaluate the vascular anatomy. CONTRAST:  11mL OMNIPAQUE IOHEXOL 350 MG/ML SOLN COMPARISON:  Chest radiographs 03/01/2020. Abdominopelvic CT 11/13/2019. No prior chest CT available. FINDINGS: Cardiovascular: The pulmonary arteries  are adequately but not optimally opacified with contrast to the level of the subsegmental branches. There is no evidence of acute pulmonary embolism. No evidence of acute systemic arterial abnormality. The heart size is normal. There is no pericardial effusion. Mediastinum/Nodes: There are no enlarged mediastinal, hilar or axillary lymph nodes.Prevascular soft tissue most consistent with residual thymic tissue. The thyroid gland, trachea and esophagus demonstrate no significant findings. Lungs/Pleura: No pleural effusion or pneumothorax. There are patchy airspace opacities in both lungs. Dependent components in both lower lobes likely represent atelectasis. There is a more diffuse component in the left upper lobe which could reflect infection. There is a smaller dependent component posteriorly in the right upper lobe. No consolidation or suspicious pulmonary nodularity. Upper abdomen: No significant findings are seen within the visualized upper abdomen. There is a probable cyst in the dome of the liver which appears unchanged. Previous cholecystectomy. Musculoskeletal/Chest wall: There is no chest wall mass or suspicious osseous finding. Review of the MIP images confirms the above findings. IMPRESSION: 1. No evidence of acute pulmonary embolism or other acute vascular findings. 2. Patchy airspace opacities in both lungs with a more diffuse component in the left upper lobe  which could reflect infection. Dependent components in both lower lobes likely represent atelectasis. Recommend radiographic follow-up. Electronically Signed   By: Richardean Sale M.D.   On: 03/18/2020 19:14   CT ABDOMEN PELVIS W CONTRAST  Result Date: 04/04/2020 CLINICAL DATA:  Chest pain.  Abdomen pain. EXAM: CT ANGIOGRAPHY CHEST CT ABDOMEN AND PELVIS WITH CONTRAST TECHNIQUE: Multidetector CT imaging of the chest was performed using the standard protocol during bolus administration of intravenous contrast. Multiplanar CT image reconstructions and MIPs were obtained to evaluate the vascular anatomy. Multidetector CT imaging of the abdomen and pelvis was performed using the standard protocol during bolus administration of intravenous contrast. CONTRAST:  190mL OMNIPAQUE IOHEXOL 350 MG/ML SOLN COMPARISON:  None. CT abdomen and pelvis November 13, 2018 1; chest CT March 18, 2020 FINDINGS: CTA CHEST FINDINGS Cardiovascular: There is no evident pulmonary embolus. No thoracic aortic aneurysm or dissection. Visualized great vessels appear normal. No thoracic aortic aneurysm or dissection. Mediastinum/Nodes: Thyroid appears normal. Small amount of thymic tissue is normal for age. No evident thoracic adenopathy. No esophageal lesions evident. Lungs/Pleura: Interval clearing of infiltrate compared to prior CT. Lungs now clear. No pleural effusion. No pneumothorax. Trachea and major bronchial structures appear patent. Musculoskeletal: No blastic or lytic bone lesions. Review of the MIP images confirms the above findings. CT ABDOMEN and PELVIS FINDINGS Hepatobiliary: There is a 5 mm cyst in the anterior segment right lobe of the liver. No other focal liver lesions are evident. Gallbladder is absent. There is no appreciable biliary dilatation. Pancreas: There is no pancreatic mass or inflammatory focus. Spleen: No splenic lesions are evident. Adrenals/Urinary Tract: Adrenals bilaterally appear normal. There is no  appreciable renal mass or hydronephrosis on either side. There is no evident renal or ureteral calculus on either side. Urinary bladder is midline with wall thickness within normal limits. Stomach/Bowel: There is no appreciable bowel wall or mesenteric thickening. Moderate stool noted in the colon. No bowel obstruction. Terminal ileum appears normal. No free air or portal venous air. Appendix absent. No periappendiceal region inflammation. Vascular/Lymphatic: No abdominal aortic aneurysm. No arterial vascular lesions are evident. Major venous structures appear patent. There is no evident adenopathy in the abdomen or pelvis. Reproductive: The uterus is anteverted.  No evident adnexal mass. Other: No abscess or ascites in the abdomen  or pelvis. Musculoskeletal: No blastic or lytic bone lesions. No abdominal wall or intramuscular lesions. Review of the MIP images confirms the above findings. IMPRESSION: CT angiogram chest: 1. No demonstrable pulmonary embolus. No thoracic aortic aneurysm or dissection. 2.  Lungs now clear. 3.  No adenopathy. CT abdomen and pelvis: 1. A cause for patient's symptoms has not been established with this study. 2. No bowel wall thickening or bowel obstruction. No abscess in the abdomen or pelvis. Appendix absent. 3.  Gallbladder absent. 4. No evident renal or ureteral calculus. No hydronephrosis. Urinary bladder wall thickness normal. Electronically Signed   By: Lowella Grip III M.D.   On: 04/04/2020 10:17   ECHOCARDIOGRAM COMPLETE  Result Date: 04/05/2020    ECHOCARDIOGRAM REPORT   Patient Name:   Laurian Brim Morella Date of Exam: 04/05/2020 Medical Rec #:  [id number]         Height:       62.0 in Accession #:    [id number]        Weight:       220.0 lb Date of Birth:  October 22, 1995         BSA:          1.991 m Patient Age:    24 years          BP:           128/75 mmHg Patient Gender: F                 HR:           85 bpm. Exam Location:  Inpatient Procedure: 2D Echo, Cardiac Doppler  and Color Doppler Indications:    R06.02 SOB  History:        Patient has no prior history of Echocardiogram examinations.                 Signs/Symptoms:Shortness of Breath, Dyspnea and Chest Pain.                 Lupus.  Sonographer:    Roseanna Rainbow RDCS Referring Phys: [phone] MATTHEW R HUNSUCKER  Sonographer Comments: Technically difficult study due to poor echo windows and patient is morbidly obese. Image acquisition challenging due to patient body habitus. IMPRESSIONS  1. Left ventricular ejection fraction, by estimation, is 60 to 65%. The left ventricle has normal function. The left ventricle has no regional wall motion abnormalities. Left ventricular diastolic parameters were normal.  2. Right ventricular systolic function is normal. The right ventricular size is normal. Tricuspid regurgitation signal is inadequate for assessing PA pressure.  3. The mitral valve is normal in structure. No evidence of mitral valve regurgitation. No evidence of mitral stenosis.  4. The aortic valve is normal in structure. Aortic valve regurgitation is not visualized. No aortic stenosis is present.  5. The inferior vena cava is normal in size with greater than 50% respiratory variability, suggesting right atrial pressure of 3 mmHg. FINDINGS  Left Ventricle: Left ventricular ejection fraction, by estimation, is 60 to 65%. The left ventricle has normal function. The left ventricle has no regional wall motion abnormalities. The left ventricular internal cavity size was normal in size. There is  borderline concentric left ventricular hypertrophy. Left ventricular diastolic parameters were normal. Normal left ventricular filling pressure. Right Ventricle: The right ventricular size is normal. No increase in right ventricular wall thickness. Right ventricular systolic function is normal. Tricuspid regurgitation signal is inadequate for assessing PA pressure. Left Atrium: Left atrial size was normal  in size. Right Atrium: Right atrial  size was normal in size. Pericardium: There is no evidence of pericardial effusion. Mitral Valve: The mitral valve is normal in structure. No evidence of mitral valve regurgitation. No evidence of mitral valve stenosis. Tricuspid Valve: The tricuspid valve is normal in structure. Tricuspid valve regurgitation is not demonstrated. No evidence of tricuspid stenosis. Aortic Valve: The aortic valve is normal in structure. Aortic valve regurgitation is not visualized. No aortic stenosis is present. Pulmonic Valve: The pulmonic valve was normal in structure. Pulmonic valve regurgitation is not visualized. No evidence of pulmonic stenosis. Aorta: The aortic root is normal in size and structure. Venous: The inferior vena cava is normal in size with greater than 50% respiratory variability, suggesting right atrial pressure of 3 mmHg. IAS/Shunts: No atrial level shunt detected by color flow Doppler.  LEFT VENTRICLE PLAX 2D LVIDd:         3.76 cm     Diastology LVIDs:         2.47 cm     LV e' medial:    6.96 cm/s LV PW:         1.27 cm     LV E/e' medial:  13.0 LV IVS:        1.35 cm     LV e' lateral:   12.90 cm/s LVOT diam:     1.90 cm     LV E/e' lateral: 7.0 LV SV:         68 LV SV Index:   34 LVOT Area:     2.84 cm  LV Volumes (MOD) LV vol d, MOD A2C: 79.4 ml LV vol d, MOD A4C: 90.0 ml LV vol s, MOD A2C: 34.3 ml LV vol s, MOD A4C: 32.0 ml LV SV MOD A2C:     45.1 ml LV SV MOD A4C:     90.0 ml LV SV MOD BP:      51.9 ml RIGHT VENTRICLE             IVC RV S prime:     11.40 cm/s  IVC diam: 1.44 cm TAPSE (M-mode): 1.9 cm LEFT ATRIUM         Index LA diam:    3.40 cm 1.71 cm/m  AORTIC VALVE             PULMONIC VALVE LVOT Vmax:   122.00 cm/s PR End Diast Vel: 1.51 msec LVOT Vmean:  79.200 cm/s LVOT VTI:    0.239 m  AORTA Ao Root diam: 2.70 cm Ao Asc diam:  2.90 cm MITRAL VALVE MV Area (PHT): 6.32 cm    SHUNTS MV Decel Time: 120 msec    Systemic VTI:  0.24 m MV E velocity: 90.60 cm/s  Systemic Diam: 1.90 cm MV A velocity:  61.20 cm/s MV E/A ratio:  1.48 Mihai Croitoru MD Electronically signed by Sanda Klein MD Signature Date/Time: 04/05/2020/5:10:04 PM    Final      Assessment & Plan:   No problem-specific Assessment & Plan notes found for this encounter.     Martyn Ehrich, NP 04/12/2020

## 2020-04-13 ENCOUNTER — Ambulatory Visit: Payer: PPO | Admitting: Internal Medicine

## 2020-04-25 ENCOUNTER — Other Ambulatory Visit: Payer: Self-pay

## 2020-04-25 ENCOUNTER — Encounter (HOSPITAL_COMMUNITY): Payer: Self-pay

## 2020-04-25 ENCOUNTER — Ambulatory Visit (HOSPITAL_COMMUNITY)
Admission: EM | Admit: 2020-04-25 | Discharge: 2020-04-25 | Disposition: A | Discharge status: Home / Self Care | Payer: PPO

## 2020-04-25 DIAGNOSIS — R059 Cough, unspecified: Secondary | ICD-10-CM | POA: Diagnosis not present

## 2020-04-25 MED ORDER — HYDROCOD POLST-CPM POLST ER 10-8 MG/5ML PO SUER: 5.0000 mL | Freq: Two times a day (BID) | ORAL | 0 refills | Status: DC | PRN

## 2020-04-25 NOTE — ED Provider Notes (Signed)
[place]    CSN: [id number] Arrival date & time: 04/25/20  3419      History   Chief Complaint Chief Complaint  Patient presents with  . Cough  . Vomiting  . Fatigue    HPI [name] is a 25 y.o. female.   Patient in today with persistent cough for 2 months now that has now caused her to vomit and keeping her awake every night.  She is recently had multiple hospitalizations for PE, tachycardia, shortness of breath, chronic nausea and vomiting and has a complicated medical history including lupus, severe asthma in addition to what was listed above.  She states she was given Hydromet at her most recent appointment but this causes her to violently vomit and burns her throat.  She has tried [employer], Phenergan DM syrup, Tussionex and states the Tussionex has been the only thing she is tolerated has helped.  She does have a pulmonologist that she follows closely with and has a follow-up coming up.  She states she has been missing a lot of work lately due to her coughing and the fatigue that comes with that.  Requesting more Tussionex today to get her through until her pulmonologist appointment.  She is consistent with her home inhaler regimen and her chronic medications.  Denies any new symptoms including fever, shortness of breath, palpitations, chest pain.     Past Medical History:  Diagnosis Date  . Asthma   . Celiac disease   . Collagen vascular disease ([place])   . Diarrhea    Patient mentions diagnosis of ulcerative colitis but it is not clear she actually has UC  . Endometriosis   . Lupus ([place])   . Ovarian cyst   . Pulmonary embolus ([place]) 02/06/2020    Patient Active Problem List   Diagnosis Date Noted  . Obesity, Class III, BMI 40-49.9 (morbid obesity) ([place]) 04/05/2020  . SOB (shortness of breath) 04/05/2020  . Abdominal pain 04/04/2020  . SLE (systemic lupus erythematosus related syndrome) ([place]) 04/04/2020  . Shortness of breath 04/04/2020  .  Pneumonia 03/23/2020  . Chronic nausea 03/23/2020  . Asthma 03/22/2020  . Dysuria 02/03/2020  . Hematuria 02/03/2020  . Mass of urinary bladder 01/26/2020  . Allergic rhinitis 05/05/2019  . 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 Endometriosis 03/14/2018  . Long-term use of high-risk medication 07/02/2017  . Lupus anticoagulant positive 07/02/2017  . Personal history of pulmonary embolism 07/02/2017  . Other forms of systemic lupus erythematosus ([place]) 03/12/2017  . Anemia 02/28/2017  . Migraine 02/28/2017  . Dysmenorrhea 08/28/2011  . Gastritis 08/28/2011    Past Surgical History:  Procedure Laterality Date  . APPENDECTOMY    . CHOLECYSTECTOMY    . EXCISION OF ENDOMETRIOMA     Ovarian cyst removal  . FOOT FRACTURE SURGERY     w/ hardware  . HERNIA REPAIR    . TONSILLECTOMY      OB History   No obstetric history on file.      Home Medications    Prior to Admission medications   Medication Sig Start Date End Date Taking? Authorizing Provider  albuterol (PROVENTIL) (2.5 MG/3ML) 0.083% nebulizer solution Take 3 mLs (2.5 mg total) by nebulization every 6 (six) hours as needed for wheezing or shortness of breath. 03/19/20  Yes Volney American, PA-C  apixaban (ELIQUIS) 5 MG TABS tablet Take 5 mg by mouth 2 (two) times daily.   Yes [provider]  chlorpheniramine-HYDROcodone Amanda Cockayne PENNKINETIC ER) 10-8  MG/5ML SUER Take 5 mLs by mouth every 12 (twelve) hours as needed for cough. 04/25/20  Yes Volney American, PA-C  hydroxychloroquine (PLAQUENIL) 200 MG tablet Take 2 tablets (400 mg total) by mouth daily. 03/23/20  Yes Rice, Resa Miner, MD  Norethindrone-Ethinyl Estradiol-Fe Biphas (LO LOESTRIN FE) 1 MG-10 MCG / 10 MCG tablet Take 1 tablet by mouth daily.   Yes [provider]  sertraline (ZOLOFT) 100 MG tablet Take 200 mg by mouth at bedtime.   Yes [provider]  tiotropium (SPIRIVA) 18 MCG inhalation capsule Place 18 mcg into inhaler  and inhale daily.   Yes [provider]  ALPRAZolam Duanne Moron) 0.5 MG tablet Take 0.25-0.5 mg by mouth daily as needed for anxiety.    [provider]  arformoterol (BROVANA) 15 MCG/2ML NEBU Take 2 mLs (15 mcg total) by nebulization 2 (two) times daily. 04/06/20   Oswald Hillock, MD  budesonide (PULMICORT) 0.5 MG/2ML nebulizer solution Take 2 mLs (0.5 mg total) by nebulization 2 (two) times daily. 04/06/20   Oswald Hillock, MD  pantoprazole (PROTONIX) 40 MG tablet Take 1 tablet (40 mg total) by mouth daily at 6 (six) AM. 04/07/20   Oswald Hillock, MD  revefenacin (YUPELRI) 175 MCG/3ML nebulizer solution Take 3 mLs (175 mcg total) by nebulization daily. 04/07/20   Oswald Hillock, MD  sucralfate (CARAFATE) 1 GM/10ML suspension Take 10 mLs (1 g total) by mouth 2 (two) times daily. 04/06/20   Oswald Hillock, MD  dicyclomine (BENTYL) 20 MG tablet Take 1 tablet (20 mg total) by mouth 2 (two) times daily. 11/29/18 04/11/19  Muthersbaugh, Jarrett Soho, PA-C  Fluticasone Propionate HFA (FLOVENT HFA IN) Inhale into the lungs. Patient not taking: Reported on 11/14/2019  11/14/19  [provider]    Family History Family History  Problem Relation Age of Onset  . Hypertension Mother   . Hypercholesterolemia Mother   . Rheum arthritis Mother   . Diabetes Father   . Hypertension Father   . Cancer Father   . Autism Brother     Social History Social History   Tobacco Use  . Smoking status: Never Smoker  . Smokeless tobacco: Never Used  Vaping Use  . Vaping Use: Never used  Substance Use Topics  . Alcohol use: Never  . Drug use: Never     Allergies   Azithromycin, Peanut-containing drug products, Iodinated diagnostic agents, Other, Amoxicillin, Fish allergy, Gluten meal, Hydromet [hydrocodone-homatropine], Ibuprofen, and Pantoprazole   Review of Systems Review of Systems Per HPI Physical Exam Triage Vital Signs ED Triage Vitals [04/25/20 0844]  Enc Vitals Group     BP 124/81      Pulse Rate (!) 115     Resp (!) 22     Temp 99.6 F (37.6 C)     Temp src      SpO2 100 %     Weight      Height      Head Circumference      Peak Flow      Pain Score 6     Pain Loc      Pain Edu?      Excl. in [place]?    No data found.  Updated Vital Signs BP 124/81   Pulse (!) 115   Temp 99.6 F (37.6 C)   Resp (!) 22   LMP  (LMP Unknown) Comment: cotinuous birth control no periods, pregnancy test negative   SpO2 100%   Visual Acuity  Right Eye Distance:   Left Eye Distance:   Bilateral Distance:    Right Eye Near:   Left Eye Near:    Bilateral Near:     Physical Exam Vitals and nursing note reviewed.  Constitutional:      Appearance: Normal appearance. She is not ill-appearing.  HENT:     Head: Atraumatic.     Nose: Nose normal.     Mouth/Throat:     Mouth: Mucous membranes are moist.     Pharynx: Oropharynx is clear. No posterior oropharyngeal erythema.  Eyes:     Extraocular Movements: Extraocular movements intact.     Conjunctiva/sclera: Conjunctivae normal.  Cardiovascular:     Rate and Rhythm: Regular rhythm. Tachycardia present.     Heart sounds: Normal heart sounds.  Pulmonary:     Effort: Pulmonary effort is normal.     Breath sounds: Normal breath sounds. No wheezing or rales.  Abdominal:     General: Bowel sounds are normal. There is no distension.     Palpations: Abdomen is soft.     Tenderness: There is no abdominal tenderness. There is no right CVA tenderness, left CVA tenderness or guarding.  Musculoskeletal:        General: Normal range of motion.     Cervical back: Normal range of motion and neck supple.  Skin:    General: Skin is warm and dry.  Neurological:     Mental Status: She is alert and oriented to person, place, and time.  Psychiatric:        Mood and Affect: Mood normal.        Thought Content: Thought content normal.        Judgment: Judgment normal.      UC Treatments / Results  Labs (all labs ordered are listed,  but only abnormal results are displayed) Labs Reviewed - No data to display  EKG   Radiology No results found.  Procedures Procedures (including critical care time)  Medications Ordered in UC Medications - No data to display  Initial Impression / Assessment and Plan / UC Course  I have reviewed the triage vital signs and the nursing notes.  Pertinent labs & imaging results that were available during my care of the patient were reviewed by me and considered in my medical decision making (see chart for details).     Multiple chronic conditions contributing to her chronic cough.  She does have a pulmonologist for which she is following up soon with.  We will refill the Tussionex for the time being for symptomatic relief as this is the only thing she has found to be helpful and tolerable.  Continue home asthma and chronic medication regimen per PCP and specialists.  Work note given.  Return for acutely worsening symptoms.  Final Clinical Impressions(s) / UC Diagnoses   Final diagnoses:  Cough   Discharge Instructions   None    ED Prescriptions    Medication Sig Dispense Auth. Provider   chlorpheniramine-HYDROcodone (TUSSIONEX PENNKINETIC ER) 10-8 MG/5ML SUER Take 5 mLs by mouth every 12 (twelve) hours as needed for cough. 150 mL Volney American, [place]     PDMP not reviewed this encounter.   Volney American, [place] 04/25/20 0930

## 2020-04-25 NOTE — ED Triage Notes (Signed)
Pt c/o fatigue, cough and vomiting on and off for 2 mos.  States Hydromet prescribed has been making her vomit. Stopped taking.

## 2020-04-30 ENCOUNTER — Other Ambulatory Visit: Payer: Self-pay

## 2020-04-30 ENCOUNTER — Encounter (HOSPITAL_COMMUNITY): Payer: Self-pay

## 2020-04-30 ENCOUNTER — Ambulatory Visit (HOSPITAL_COMMUNITY)
Admission: EM | Admit: 2020-04-30 | Discharge: 2020-04-30 | Disposition: A | Discharge status: Home / Self Care | Payer: PPO

## 2020-04-30 ENCOUNTER — Emergency Department (HOSPITAL_BASED_OUTPATIENT_CLINIC_OR_DEPARTMENT_OTHER): Payer: PPO | Admitting: Radiology

## 2020-04-30 ENCOUNTER — Emergency Department (HOSPITAL_BASED_OUTPATIENT_CLINIC_OR_DEPARTMENT_OTHER)
Admission: EM | Admit: 2020-04-30 | Discharge: 2020-04-30 | Disposition: A | Discharge status: Home / Self Care | Payer: PPO | Attending: Emergency Medicine | Admitting: Emergency Medicine

## 2020-04-30 ENCOUNTER — Telehealth: Payer: PPO | Admitting: Physician Assistant

## 2020-04-30 ENCOUNTER — Encounter: Payer: Self-pay | Admitting: Physician Assistant

## 2020-04-30 ENCOUNTER — Encounter (HOSPITAL_BASED_OUTPATIENT_CLINIC_OR_DEPARTMENT_OTHER): Payer: Self-pay

## 2020-04-30 DIAGNOSIS — Z7901 Long term (current) use of anticoagulants: Secondary | ICD-10-CM | POA: Insufficient documentation

## 2020-04-30 DIAGNOSIS — Z7951 Long term (current) use of inhaled steroids: Secondary | ICD-10-CM | POA: Diagnosis not present

## 2020-04-30 DIAGNOSIS — R053 Chronic cough: Secondary | ICD-10-CM

## 2020-04-30 DIAGNOSIS — Z91010 Allergy to peanuts: Secondary | ICD-10-CM | POA: Diagnosis not present

## 2020-04-30 DIAGNOSIS — R059 Cough, unspecified: Secondary | ICD-10-CM

## 2020-04-30 DIAGNOSIS — R509 Fever, unspecified: Secondary | ICD-10-CM | POA: Insufficient documentation

## 2020-04-30 DIAGNOSIS — R1030 Lower abdominal pain, unspecified: Secondary | ICD-10-CM | POA: Insufficient documentation

## 2020-04-30 DIAGNOSIS — N809 Endometriosis, unspecified: Secondary | ICD-10-CM

## 2020-04-30 DIAGNOSIS — N946 Dysmenorrhea, unspecified: Secondary | ICD-10-CM | POA: Diagnosis not present

## 2020-04-30 DIAGNOSIS — Z86711 Personal history of pulmonary embolism: Secondary | ICD-10-CM | POA: Diagnosis not present

## 2020-04-30 DIAGNOSIS — J45909 Unspecified asthma, uncomplicated: Secondary | ICD-10-CM | POA: Diagnosis not present

## 2020-04-30 LAB — CBC
HCT: 34.6 % — ABNORMAL LOW (ref 36.0–46.0)
Hemoglobin: 10.7 g/dL — ABNORMAL LOW (ref 12.0–15.0)
MCH: 22.7 pg — ABNORMAL LOW (ref 26.0–34.0)
MCHC: 30.9 g/dL (ref 30.0–36.0)
MCV: 73.3 fL — ABNORMAL LOW (ref 80.0–100.0)
Platelets: 476 10*3/uL — ABNORMAL HIGH (ref 150–400)
RBC: 4.72 MIL/uL (ref 3.87–5.11)
RDW: 17.3 % — ABNORMAL HIGH (ref 11.5–15.5)
WBC: 8.2 10*3/uL (ref 4.0–10.5)
nRBC: 0 % (ref 0.0–0.2)

## 2020-04-30 LAB — COMPREHENSIVE METABOLIC PANEL
ALT: 10 U/L (ref 0–44)
AST: 17 U/L (ref 15–41)
Albumin: 4.1 g/dL (ref 3.5–5.0)
Alkaline Phosphatase: 66 U/L (ref 38–126)
Anion gap: 10 (ref 5–15)
BUN: 6 mg/dL (ref 6–20)
CO2: 23 mmol/L (ref 22–32)
Calcium: 9.3 mg/dL (ref 8.9–10.3)
Chloride: 107 mmol/L (ref 98–111)
Creatinine, Ser: 0.87 mg/dL (ref 0.44–1.00)
GFR, Estimated: >60 mL/min (ref >60)
Glucose, Bld: 87 mg/dL (ref 70–99)
Potassium: 3.6 mmol/L (ref 3.5–5.1)
Sodium: 140 mmol/L (ref 135–145)
Total Bilirubin: 0.2 mg/dL — ABNORMAL LOW (ref 0.3–1.2)
Total Protein: 7.2 g/dL (ref 6.5–8.1)

## 2020-04-30 LAB — PREGNANCY, URINE: Preg Test, Ur: NEGATIVE

## 2020-04-30 LAB — ACETAMINOPHEN LEVEL: Acetaminophen (Tylenol), Serum: 24 ug/mL (ref 10–30)

## 2020-04-30 MED ORDER — TRAMADOL HCL 50 MG PO TABS: 50.0000 mg | ORAL_TABLET | Freq: Four times a day (QID) | ORAL | 0 refills | Status: DC | PRN

## 2020-04-30 MED ORDER — HYDROMORPHONE HCL 1 MG/ML IJ SOLN
1.0000 mg | Freq: Once | INTRAMUSCULAR | Status: AC
  Administered 2020-04-30: 1 mg via INTRAMUSCULAR
  Filled 2020-04-30: qty 1

## 2020-04-30 MED ORDER — ONDANSETRON 4 MG PO TBDP
8.0000 mg | ORAL_TABLET | Freq: Once | ORAL | Status: AC
  Administered 2020-04-30: 8 mg via ORAL
  Filled 2020-04-30: qty 2

## 2020-04-30 MED ORDER — HYDROMORPHONE HCL 1 MG/ML IJ SOLN
0.5000 mg | Freq: Once | INTRAMUSCULAR | Status: AC
  Administered 2020-04-30: 0.5 mg via INTRAVENOUS
  Filled 2020-04-30: qty 1

## 2020-04-30 NOTE — Progress Notes (Signed)
Virtual Visit via Video Note  I connected with [name] on 04/30/20 at  1:30 PM EDT by a video enabled telemedicine application and verified that I am speaking with the correct person using two identifiers.  Location: Patient: patient's home   Provider: provider's office  Person participating in the virtual visit: patient and provider     I discussed the limitations of evaluation and management by telemedicine and the availability of in person appointments. The patient expressed understanding and agreed to proceed.    I discussed the assessment and treatment plan with the patient. The patient was provided an opportunity to ask questions and all were answered. The patient agreed with the plan and demonstrated an understanding of the instructions.   The patient was advised to call back or seek an in-person evaluation if the symptoms worsen or if the condition fails to improve as anticipated.  I provided 15 minutes of non-face-to-face time during this encounter.   Waldon Merl, PA-C    Acute Office Visit  Subjective:    Patient ID: [name], female    DOB: May 18, 1995, 25 y.o.   MRN: [id number]  No chief complaint on file.   25 yo F in NAD connected via video to request refill of cough medication. States went to [facility] 6 days ago with a cough and was prescribed Tussionex. States the medicine spilled and she needs refill of it. States cough has eben there for approx 2 months on and off-  After Covid 19 diagnosis and hospitalization. States has been prescribed antibiotics more than once without relief of the cough. States has PMH of Asthma on inhalers, without relief of the cough. Denies any fever, chills, sweats, headache, body aches,  cp, dyspnea, wheezing, changes to her taste or smell, rash. Denies any known contact with someone with Covid 19/flu/ or strep.     Patient is in today for "refill on cough medication"  Past Medical History:  Diagnosis Date  .  Asthma   . Celiac disease   . Collagen vascular disease ([place])   . Diarrhea    Patient mentions diagnosis of ulcerative colitis but it is not clear she actually has UC  . Endometriosis   . Lupus ([place])   . Ovarian cyst   . Pulmonary embolus ([place]) 02/06/2020    Past Surgical History:  Procedure Laterality Date  . APPENDECTOMY    . CHOLECYSTECTOMY    . EXCISION OF ENDOMETRIOMA     Ovarian cyst removal  . FOOT FRACTURE SURGERY     w/ hardware  . HERNIA REPAIR    . TONSILLECTOMY      Family History  Problem Relation Age of Onset  . Hypertension Mother   . Hypercholesterolemia Mother   . Rheum arthritis Mother   . Diabetes Father   . Hypertension Father   . Cancer Father   . Autism Brother     Social History   Socioeconomic History  . Marital status: Single    Spouse name: Not on file  . Number of children: Not on file  . Years of education: Not on file  . Highest education level: Not on file  Occupational History  . Not on file  Tobacco Use  . Smoking status: Never Smoker  . Smokeless tobacco: Never Used  Vaping Use  . Vaping Use: Never used  Substance and Sexual Activity  . Alcohol use: Never  . Drug use: Never  . Sexual activity: Not on file  Other Topics Concern  . Not on file  Social History Narrative  . Not on file   Social Determinants of Health   Financial Resource Strain: Not on file  Food Insecurity: Not on file  Transportation Needs: Not on file  Physical Activity: Not on file  Stress: Not on file  Social Connections: Not on file  Intimate Partner Violence: Not on file    Outpatient Medications Prior to Visit  Medication Sig Dispense Refill  . albuterol (PROVENTIL) (2.5 MG/3ML) 0.083% nebulizer solution Take 3 mLs (2.5 mg total) by nebulization every 6 (six) hours as needed for wheezing or shortness of breath. 75 mL 12  . ALPRAZolam (XANAX) 0.5 MG tablet Take 0.25-0.5 mg by mouth daily as needed for anxiety.    Marland Kitchen apixaban (ELIQUIS) 5 MG  TABS tablet Take 5 mg by mouth 2 (two) times daily.    Marland Kitchen arformoterol (BROVANA) 15 MCG/2ML NEBU Take 2 mLs (15 mcg total) by nebulization 2 (two) times daily. 120 mL 1  . budesonide (PULMICORT) 0.5 MG/2ML nebulizer solution Take 2 mLs (0.5 mg total) by nebulization 2 (two) times daily. 100 mL 3  . chlorpheniramine-HYDROcodone (TUSSIONEX PENNKINETIC ER) 10-8 MG/5ML SUER Take 5 mLs by mouth every 12 (twelve) hours as needed for cough. 150 mL 0  . hydroxychloroquine (PLAQUENIL) 200 MG tablet Take 2 tablets (400 mg total) by mouth daily. 180 tablet 0  . Norethindrone-Ethinyl Estradiol-Fe Biphas (LO LOESTRIN FE) 1 MG-10 MCG / 10 MCG tablet Take 1 tablet by mouth daily.    . pantoprazole (PROTONIX) 40 MG tablet Take 1 tablet (40 mg total) by mouth daily at 6 (six) AM. 30 tablet 2  . revefenacin (YUPELRI) 175 MCG/3ML nebulizer solution Take 3 mLs (175 mcg total) by nebulization daily. 90 mL 1  . sertraline (ZOLOFT) 100 MG tablet Take 200 mg by mouth at bedtime.    . sucralfate (CARAFATE) 1 GM/10ML suspension Take 10 mLs (1 g total) by mouth 2 (two) times daily. 420 mL 0  . tiotropium (SPIRIVA) 18 MCG inhalation capsule Place 18 mcg into inhaler and inhale daily.     No facility-administered medications prior to visit.    Allergies  Allergen Reactions  . Azithromycin Anaphylaxis  . Peanut-Containing Drug Products Other (See Comments)    Unknown ; treenuts, shell fish Unknown reaction, took allergy test  . Iodinated Diagnostic Agents Hives and Itching    02/04/2020 Patient stated has history of itching and hives with CT IV Iodine contrast, premedicated with Benadryl 25 mg IV prior to CT IV Iodine scan today.Post CT scan patient reported itching, Benadryl 25 mg IV given, symptoms resolved, NP recommends for patient to get premedicated with Benadryl 50 mg IV prior to future CT IV Iodine scans.   . Other Other (See Comments)    Moderna covid vaccine -Syncope   . Amoxicillin Swelling    Hand swelling    . Fish Allergy Other (See Comments)    Unknown Allergy test   . Gluten Meal Other (See Comments)    Celiac disease  . Hydromet [Hydrocodone-Homatropine] Nausea And Vomiting  . Ibuprofen Nausea And Vomiting  . Pantoprazole Other (See Comments)    Stomach pain    Review of Systems  Constitutional: Negative for activity change, appetite change, chills, diaphoresis, fatigue and fever.  HENT: Negative for congestion, rhinorrhea, sinus pressure, sinus pain, sneezing, sore throat and trouble swallowing.   Respiratory: Positive for cough. Negative for chest tightness, shortness of breath and wheezing.   Cardiovascular:  Negative for chest pain.  Gastrointestinal: Negative for abdominal pain, nausea and vomiting.  Musculoskeletal: Negative for myalgias.  Skin: Negative for rash.  Neurological: Negative for headaches.  Psychiatric/Behavioral: Negative for agitation, behavioral problems and confusion.       Objective:    Physical Exam Constitutional:      General: She is not in acute distress.    Appearance: Normal appearance. She is not ill-appearing, toxic-appearing or diaphoretic.     Comments: Coughing during video visit.  HENT:     Head: Normocephalic and atraumatic.  Eyes:     General: No scleral icterus. Pulmonary:     Effort: Pulmonary effort is normal.  Skin:    Coloration: Skin is not pale.  Neurological:     Mental Status: She is alert and oriented to person, place, and time.  Psychiatric:        Mood and Affect: Mood normal.        Behavior: Behavior normal.        Thought Content: Thought content normal.        Judgment: Judgment normal.     LMP  (LMP Unknown) Comment: cotinuous birth control no periods, pregnancy test negative  Wt Readings from Last 3 Encounters:  04/04/20 220 lb (99.8 kg)  03/23/20 257 lb 12.8 oz (116.9 kg)  03/22/20 208 lb 12.8 oz (94.7 kg)    Health Maintenance Due  Topic Date Due  . Hepatitis C Screening  Never done  . HPV VACCINES  (1 - Risk 3-dose series) Never done  . PAP-Cervical Cytology Screening  Never done  . PAP SMEAR-Modifier  Never done  . COVID-19 Vaccine (2 - Inadvertent risk 4-dose series) 04/20/2019       Topic Date Due  . HPV VACCINES (1 - Risk 3-dose series) Never done     Lab Results  Component Value Date   TSH 1.235 04/04/2020   Lab Results  Component Value Date   WBC 12.2 (H) 04/06/2020   HGB 10.6 (L) 04/06/2020   HCT 33.9 (L) 04/06/2020   MCV 73.2 (L) 04/06/2020   PLT 464 (H) 04/06/2020   Lab Results  Component Value Date   NA 137 04/06/2020   K 3.6 04/06/2020   CO2 20 (L) 04/06/2020   GLUCOSE 113 (H) 04/06/2020   BUN 7 04/06/2020   CREATININE 0.72 04/06/2020   BILITOT 0.8 04/06/2020   ALKPHOS 62 04/06/2020   AST 25 04/06/2020   ALT 18 04/06/2020   PROT 6.8 04/06/2020   ALBUMIN 3.6 04/06/2020   CALCIUM 9.3 04/06/2020   ANIONGAP 12 04/06/2020   No results found for: CHOL No results found for: HDL No results found for: LDLCALC No results found for: TRIG No results found for: CHOLHDL No results found for: HGBA1C     Assessment & Plan:   Problem List Items Addressed This Visit   None   Visit Diagnoses    Cough    -  Primary      Discussed cough with patient and potential that she may be in asthma exacerbation which likely predisposes her to bacterial infection such as pneumonia. Offered treatment with steroids and antibiotics- but she declined- stating steroids elevate her blood sugars to the 200s, and she has been on multiple courses of antibiotics recently without improvement.  Offered cough medication such as Promethazine DM- she declines- stating that she takes promethazine for other reasons and does not believe she benefits from the cough syrup.   No orders of the defined  types were placed in this encounter.    Waldon Merl, PA-C

## 2020-04-30 NOTE — Discharge Instructions (Addendum)
It was our pleasure to provide your ER care today - we hope that you feel better.  Drink plenty of fluids/stay well hydrated.   Make sure to only take acetaminophen and other meds as prescribed - do not exceed the recommended doses.   You may take ultram as need for pain - no driving when taking.   Follow up with primary care doctor in the coming week. Also follow up with your pulmonary doctor as planned regarding your chronic cough.   Return to ER if worse, new symptoms, fevers, new, severe or worsening pain, persistent vomiting, increased trouble breathing, or other concern.   You were given pain medication in the ER - no driving for the next 8 hours.

## 2020-04-30 NOTE — ED Provider Notes (Signed)
[name]    CSN: [id number] Arrival date & time: 04/30/20  1429      History   Chief Complaint Chief Complaint  Patient presents with  . Cough    HPI [name] is a 25 y.o. female.   Patient with recent history of pulmonary embolism on anticoagulation presenting today following up on her acute on chronic cough the past 2 months.  She states that Tussidex is been the only thing that has helped with her symptoms and this time.  She was given a supply of this on 04/26/2020 but states that unfortunately her dog knocked over the bottle and it spilled everywhere.  She is requesting a refill of this medication today as it is the only thing that helps with her symptoms.  Per record review, she was seen virtually by her PCP this morning who offered prednisone, antibiotics, Phenergan DM but patient declined these for multiple reasons.  She also states that she is started her period this morning and has significant menstrual cramping which is common for her given her history of endometriosis.  She is taking Tylenol without any benefit with this and is unable to rest given the pain.  Does follow with OB/GYN for this.  She denies fever, chills, dysuria, hematuria, abdominal pain, chest pain, shortness of breath.     Past Medical History:  Diagnosis Date  . Asthma   . Celiac disease   . Collagen vascular disease ([place])   . Diarrhea    Patient mentions diagnosis of ulcerative colitis but it is not clear she actually has UC  . Endometriosis   . Lupus ([place])   . Ovarian cyst   . Pulmonary embolus ([place]) 02/06/2020    Patient Active Problem List   Diagnosis Date Noted  . Obesity, Class III, BMI 40-49.9 (morbid obesity) ([place]) 04/05/2020  . SOB (shortness of breath) 04/05/2020  . Abdominal pain 04/04/2020  . SLE (systemic lupus erythematosus related syndrome) ([place]) 04/04/2020  . Shortness of breath 04/04/2020  . Pneumonia 03/23/2020  . Chronic nausea 03/23/2020  . Asthma  03/22/2020  . Dysuria 02/03/2020  . Hematuria 02/03/2020  . Mass of urinary bladder 01/26/2020  . Allergic rhinitis 05/05/2019  . 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 Endometriosis 03/14/2018  . Long-term use of high-risk medication 07/02/2017  . Lupus anticoagulant positive 07/02/2017  . Personal history of pulmonary embolism 07/02/2017  . Other forms of systemic lupus erythematosus ([place]) 03/12/2017  . Anemia 02/28/2017  . Migraine 02/28/2017  . Dysmenorrhea 08/28/2011  . Gastritis 08/28/2011    Past Surgical History:  Procedure Laterality Date  . APPENDECTOMY    . CHOLECYSTECTOMY    . EXCISION OF ENDOMETRIOMA     Ovarian cyst removal  . FOOT FRACTURE SURGERY     w/ hardware  . HERNIA REPAIR    . TONSILLECTOMY      OB History   No obstetric history on file.      Home Medications    Prior to Admission medications   Medication Sig Start Date End Date Taking? Authorizing Provider  albuterol (PROVENTIL) (2.5 MG/3ML) 0.083% nebulizer solution Take 3 mLs (2.5 mg total) by nebulization every 6 (six) hours as needed for wheezing or shortness of breath. 03/19/20   Volney American, PA-C  ALPRAZolam Duanne Moron) 0.5 MG tablet Take 0.25-0.5 mg by mouth daily as needed for anxiety.    [provider]  apixaban (ELIQUIS) 5 MG TABS tablet Take 5 mg by mouth 2 (two) times daily.  [provider]  arformoterol (BROVANA) 15 MCG/2ML NEBU Take 2 mLs (15 mcg total) by nebulization 2 (two) times daily. 04/06/20   Oswald Hillock, MD  budesonide (PULMICORT) 0.5 MG/2ML nebulizer solution Take 2 mLs (0.5 mg total) by nebulization 2 (two) times daily. 04/06/20   Oswald Hillock, MD  chlorpheniramine-HYDROcodone (TUSSIONEX PENNKINETIC ER) 10-8 MG/5ML SUER Take 5 mLs by mouth every 12 (twelve) hours as needed for cough. 04/25/20   Volney American, PA-C  hydroxychloroquine (PLAQUENIL) 200 MG tablet Take 2 tablets (400 mg total) by mouth daily. 03/23/20   Rice, Resa Miner, MD   Norethindrone-Ethinyl Estradiol-Fe Biphas (LO LOESTRIN FE) 1 MG-10 MCG / 10 MCG tablet Take 1 tablet by mouth daily.    [provider]  pantoprazole (PROTONIX) 40 MG tablet Take 1 tablet (40 mg total) by mouth daily at 6 (six) AM. 04/07/20   Oswald Hillock, MD  revefenacin (YUPELRI) 175 MCG/3ML nebulizer solution Take 3 mLs (175 mcg total) by nebulization daily. 04/07/20   Oswald Hillock, MD  sertraline (ZOLOFT) 100 MG tablet Take 200 mg by mouth at bedtime.    [provider]  sucralfate (CARAFATE) 1 GM/10ML suspension Take 10 mLs (1 g total) by mouth 2 (two) times daily. 04/06/20   Oswald Hillock, MD  tiotropium (SPIRIVA) 18 MCG inhalation capsule Place 18 mcg into inhaler and inhale daily.    [provider]  dicyclomine (BENTYL) 20 MG tablet Take 1 tablet (20 mg total) by mouth 2 (two) times daily. 11/29/18 04/11/19  Muthersbaugh, Jarrett Soho, PA-C  Fluticasone Propionate HFA (FLOVENT HFA IN) Inhale into the lungs. Patient not taking: Reported on 11/14/2019  11/14/19  [provider]    Family History Family History  Problem Relation Age of Onset  . Hypertension Mother   . Hypercholesterolemia Mother   . Rheum arthritis Mother   . Diabetes Father   . Hypertension Father   . Cancer Father   . Autism Brother     Social History Social History   Tobacco Use  . Smoking status: Never Smoker  . Smokeless tobacco: Never Used  Vaping Use  . Vaping Use: Never used  Substance Use Topics  . Alcohol use: Never  . Drug use: Never     Allergies   Azithromycin, Peanut-containing drug products, Iodinated diagnostic agents, Other, Amoxicillin, Fish allergy, Gluten meal, Hydromet [hydrocodone-homatropine], Ibuprofen, and Pantoprazole   Review of Systems Review of Systems Per HPI  Physical Exam Triage Vital Signs ED Triage Vitals  Enc Vitals Group     BP 04/30/20 1512 119/82     Pulse Rate 04/30/20 1512 (!) 116     Resp 04/30/20 1512 (!) 22     Temp  04/30/20 1512 98.6 F (37 C)     Temp Source 04/30/20 1512 Oral     SpO2 04/30/20 1512 98 %     Weight --      Height --      Head Circumference --      Peak Flow --      Pain Score 04/30/20 1515 0     Pain Loc --      Pain Edu? --      Excl. in [place]? --    No data found.  Updated Vital Signs BP 119/82 (BP Location: Left Arm)   Pulse (!) 116   Temp 98.6 F (37 C) (Oral)   Resp (!) 22   LMP 04/29/2020 (Exact Date)   SpO2 98%  Visual Acuity Right Eye Distance:   Left Eye Distance:   Bilateral Distance:    Right Eye Near:   Left Eye Near:    Bilateral Near:     Physical Exam Vitals and nursing note reviewed.  Constitutional:      Appearance: Normal appearance. She is not ill-appearing.  HENT:     Head: Atraumatic.     Nose: Nose normal.     Mouth/Throat:     Mouth: Mucous membranes are moist.     Pharynx: Posterior oropharyngeal erythema present. No oropharyngeal exudate.  Eyes:     Extraocular Movements: Extraocular movements intact.     Conjunctiva/sclera: Conjunctivae normal.  Cardiovascular:     Rate and Rhythm: Normal rate and regular rhythm.     Heart sounds: Normal heart sounds.  Pulmonary:     Effort: Pulmonary effort is normal. No respiratory distress.     Breath sounds: Normal breath sounds. No wheezing or rales.  Abdominal:     General: Bowel sounds are normal. There is no distension.     Palpations: Abdomen is soft.     Tenderness: There is no abdominal tenderness.  Musculoskeletal:        General: Normal range of motion.     Cervical back: Normal range of motion and neck supple.  Skin:    General: Skin is warm and dry.  Neurological:     Mental Status: She is alert and oriented to person, place, and time.  Psychiatric:        Mood and Affect: Mood normal.        Thought Content: Thought content normal.        Judgment: Judgment normal.      UC Treatments / Results  Labs (all labs ordered are listed, but only abnormal results are  displayed) Labs Reviewed - No data to display  EKG   Radiology No results found.  Procedures Procedures (including critical care time)  Medications Ordered in UC Medications - No data to display  Initial Impression / Assessment and Plan / UC Course  I have reviewed the triage vital signs and the nursing notes.  Pertinent labs & imaging results that were available during my care of the patient were reviewed by me and considered in my medical decision making (see chart for details).     Discussed with patient that unfortunately I would not be able to refill the control cough syrup that was given to her several days ago.  She declines oral or IM steroids today, continue inhaler regimen and Tylenol for her menstrual cramping.  Follow-up closely with pulmonologist and OB/GYN for ongoing management of her chronic illnesses.  Final Clinical Impressions(s) / UC Diagnoses   Final diagnoses:  Cough  Dysmenorrhea  History of pulmonary embolus ([place])  Endometriosis   Discharge Instructions   None    ED Prescriptions    None     I have reviewed the PDMP during this encounter.   Volney American, [place] 04/30/20 [phone]

## 2020-04-30 NOTE — ED Provider Notes (Addendum)
[facility] EMERGENCY DEPT Provider Note   CSN: [id number] Arrival date & time: 04/30/20  1820     History Chief Complaint  Patient presents with  . Abdominal Pain    [name] is a 25 y.o. female.  Patient c/o being on menstrual cycle, and states has endometriosis, so that when on periods gets bad cramping. States on period for past two days, constant, dull, moderate, bilateral lower abd/pelvic cramping, c/w prior period cramping. States cramps are generally so bad they make her feel like passing out. Denies syncope. No excessively heavy bleeding, but rather c/w prior periods. States changes partially saturated pad every few hours. Denies fever or chills. Also notes chronic cough for past 2-3 months - states has plans to f/u with her pulmonary doctor for that - no acute or abrupt worsening, but persistent symptoms. No sore throat or runny nose. No sob. No chest pain. No current asthma or wheezing exacerbation. States compliant w home meds.   The history is provided by the patient.  Abdominal Pain Associated symptoms: cough   Associated symptoms: no chest pain, no chills, no diarrhea, no dysuria, no fever, no shortness of breath, no sore throat and no vomiting        Past Medical History:  Diagnosis Date  . Asthma   . Celiac disease   . Collagen vascular disease ([place])   . Diarrhea    Patient mentions diagnosis of ulcerative colitis but it is not clear she actually has UC  . Endometriosis   . Lupus ([place])   . Ovarian cyst   . Pulmonary embolus ([place]) 02/06/2020    Patient Active Problem List   Diagnosis Date Noted  . Obesity, Class III, BMI 40-49.9 (morbid obesity) ([place]) 04/05/2020  . SOB (shortness of breath) 04/05/2020  . Abdominal pain 04/04/2020  . SLE (systemic lupus erythematosus related syndrome) ([place]) 04/04/2020  . Shortness of breath 04/04/2020  . Pneumonia 03/23/2020  . Chronic nausea 03/23/2020  . Asthma 03/22/2020  . Dysuria 02/03/2020   . Hematuria 02/03/2020  . Mass of urinary bladder 01/26/2020  . Allergic rhinitis 05/05/2019  . 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 Endometriosis 03/14/2018  . Long-term use of high-risk medication 07/02/2017  . Lupus anticoagulant positive 07/02/2017  . Personal history of pulmonary embolism 07/02/2017  . Other forms of systemic lupus erythematosus ([place]) 03/12/2017  . Anemia 02/28/2017  . Migraine 02/28/2017  . Dysmenorrhea 08/28/2011  . Gastritis 08/28/2011    Past Surgical History:  Procedure Laterality Date  . APPENDECTOMY    . CHOLECYSTECTOMY    . EXCISION OF ENDOMETRIOMA     Ovarian cyst removal  . FOOT FRACTURE SURGERY     w/ hardware  . HERNIA REPAIR    . TONSILLECTOMY       OB History   No obstetric history on file.     Family History  Problem Relation Age of Onset  . Hypertension Mother   . Hypercholesterolemia Mother   . Rheum arthritis Mother   . Diabetes Father   . Hypertension Father   . Cancer Father   . Autism Brother     Social History   Tobacco Use  . Smoking status: Never Smoker  . Smokeless tobacco: Never Used  Vaping Use  . Vaping Use: Never used  Substance Use Topics  . Alcohol use: Never  . Drug use: Never    Home Medications Prior to Admission medications   Medication Sig Start Date End Date Taking? Authorizing Provider  albuterol (  PROVENTIL) (2.5 MG/3ML) 0.083% nebulizer solution Take 3 mLs (2.5 mg total) by nebulization every 6 (six) hours as needed for wheezing or shortness of breath. 03/19/20   Volney American, PA-C  ALPRAZolam Duanne Moron) 0.5 MG tablet Take 0.25-0.5 mg by mouth daily as needed for anxiety.    [provider]  apixaban (ELIQUIS) 5 MG TABS tablet Take 5 mg by mouth 2 (two) times daily.    [provider]  arformoterol (BROVANA) 15 MCG/2ML NEBU Take 2 mLs (15 mcg total) by nebulization 2 (two) times daily. 04/06/20   Oswald Hillock, MD  budesonide (PULMICORT) 0.5 MG/2ML nebulizer solution Take  2 mLs (0.5 mg total) by nebulization 2 (two) times daily. 04/06/20   Oswald Hillock, MD  chlorpheniramine-HYDROcodone (TUSSIONEX PENNKINETIC ER) 10-8 MG/5ML SUER Take 5 mLs by mouth every 12 (twelve) hours as needed for cough. 04/25/20   Volney American, PA-C  hydroxychloroquine (PLAQUENIL) 200 MG tablet Take 2 tablets (400 mg total) by mouth daily. 03/23/20   Rice, Resa Miner, MD  Norethindrone-Ethinyl Estradiol-Fe Biphas (LO LOESTRIN FE) 1 MG-10 MCG / 10 MCG tablet Take 1 tablet by mouth daily.    [provider]  pantoprazole (PROTONIX) 40 MG tablet Take 1 tablet (40 mg total) by mouth daily at 6 (six) AM. 04/07/20   Oswald Hillock, MD  revefenacin (YUPELRI) 175 MCG/3ML nebulizer solution Take 3 mLs (175 mcg total) by nebulization daily. 04/07/20   Oswald Hillock, MD  sertraline (ZOLOFT) 100 MG tablet Take 200 mg by mouth at bedtime.    [provider]  sucralfate (CARAFATE) 1 GM/10ML suspension Take 10 mLs (1 g total) by mouth 2 (two) times daily. 04/06/20   Oswald Hillock, MD  tiotropium (SPIRIVA) 18 MCG inhalation capsule Place 18 mcg into inhaler and inhale daily.    [provider]  dicyclomine (BENTYL) 20 MG tablet Take 1 tablet (20 mg total) by mouth 2 (two) times daily. 11/29/18 04/11/19  Muthersbaugh, Jarrett Soho, PA-C  Fluticasone Propionate HFA (FLOVENT HFA IN) Inhale into the lungs. Patient not taking: Reported on 11/14/2019  11/14/19  [provider]    Allergies    Azithromycin, Peanut-containing drug products, Iodinated diagnostic agents, Other, Amoxicillin, Fish allergy, Gluten meal, Hydromet [hydrocodone-homatropine], Ibuprofen, and Pantoprazole  Review of Systems   Review of Systems  Constitutional: Negative for chills and fever.  HENT: Negative for sore throat.   Eyes: Negative for redness.  Respiratory: Positive for cough. Negative for shortness of breath.   Cardiovascular: Negative for chest pain, palpitations and leg swelling.   Gastrointestinal: Positive for abdominal pain. Negative for diarrhea and vomiting.  Genitourinary: Negative for dysuria and flank pain.       +menstrual cramping  Musculoskeletal: Negative for back pain and neck pain.  Skin: Negative for rash.  Neurological: Negative for syncope and headaches.  Hematological:       On anticoag therapy, denies abnormal bruising or bleeding.   Psychiatric/Behavioral: Negative for confusion.    Physical Exam Updated Vital Signs BP 135/69 (BP Location: Right Arm)   Pulse (!) 109   Temp 98.9 F (37.2 C) (Oral)   Resp 18   LMP 04/29/2020 (Exact Date)   SpO2 98%   Physical Exam Vitals and nursing note reviewed.  Constitutional:      Appearance: Normal appearance. She is well-developed.  HENT:     Head: Atraumatic.     Nose: Nose normal.     Mouth/Throat:     Mouth: Mucous  membranes are moist.  Eyes:     General: No scleral icterus.    Conjunctiva/sclera: Conjunctivae normal.  Neck:     Trachea: No tracheal deviation.  Cardiovascular:     Rate and Rhythm: Normal rate and regular rhythm.     Pulses: Normal pulses.     Heart sounds: Normal heart sounds. No murmur heard. No friction rub. No gallop.   Pulmonary:     Effort: Pulmonary effort is normal. No respiratory distress.     Breath sounds: Normal breath sounds.  Abdominal:     General: Bowel sounds are normal. There is no distension.     Palpations: Abdomen is soft. There is no mass.     Tenderness: There is no abdominal tenderness. There is no guarding or rebound.     Hernia: No hernia is present.  Genitourinary:    Comments: No cva tenderness.  Musculoskeletal:        General: No swelling or tenderness.     Cervical back: Normal range of motion and neck supple. No rigidity. No muscular tenderness.     Right lower leg: No edema.     Left lower leg: No edema.  Skin:    General: Skin is warm and dry.     Findings: No rash.  Neurological:     Mental Status: She is alert.      Comments: Alert, speech normal. Steady gait.   Psychiatric:        Mood and Affect: Mood normal.     ED Results / Procedures / Treatments   Labs (all labs ordered are listed, but only abnormal results are displayed) Results for orders placed or performed during the hospital encounter of 04/30/20  CBC  Result Value Ref Range   WBC 8.2 4.0 - 10.5 K/uL   RBC 4.72 3.87 - 5.11 MIL/uL   Hemoglobin 10.7 (L) 12.0 - 15.0 g/dL   HCT 34.6 (L) 36.0 - 46.0 %   MCV 73.3 (L) 80.0 - 100.0 fL   MCH 22.7 (L) 26.0 - 34.0 pg   MCHC 30.9 30.0 - 36.0 g/dL   RDW 17.3 (H) 11.5 - 15.5 %   Platelets 476 (H) 150 - 400 K/uL   nRBC 0.0 0.0 - 0.2 %  Comprehensive metabolic panel  Result Value Ref Range   Sodium 140 135 - 145 mmol/L   Potassium 3.6 3.5 - 5.1 mmol/L   Chloride 107 98 - 111 mmol/L   CO2 23 22 - 32 mmol/L   Glucose, Bld 87 70 - 99 mg/dL   BUN 6 6 - 20 mg/dL   Creatinine, Ser 0.87 0.44 - 1.00 mg/dL   Calcium 9.3 8.9 - 10.3 mg/dL   Total Protein 7.2 6.5 - 8.1 g/dL   Albumin 4.1 3.5 - 5.0 g/dL   AST 17 15 - 41 U/L   ALT 10 0 - 44 U/L   Alkaline Phosphatase 66 38 - 126 U/L   Total Bilirubin 0.2 (L) 0.3 - 1.2 mg/dL   GFR, Estimated >60 >60 mL/min   Anion gap 10 5 - 15  Pregnancy, urine  Result Value Ref Range   Preg Test, Ur NEGATIVE NEGATIVE  Acetaminophen level  Result Value Ref Range   Acetaminophen (Tylenol), Serum 24 10 - 30 ug/mL   DG Chest 2 View  Result Date: 04/30/2020 CLINICAL DATA:  Cough. EXAM: CHEST - 2 VIEW COMPARISON:  April 04, 2020 FINDINGS: The heart size and mediastinal contours are within normal limits. Both lungs are clear.  The visualized skeletal structures are unremarkable. Radiopaque surgical clips are seen within the right upper quadrant. IMPRESSION: No active cardiopulmonary disease. Electronically Signed   By: Virgina Norfolk M.D.   On: 04/30/2020 19:55   DG Chest 2 View  Result Date: 04/04/2020 CLINICAL DATA:  Shortness of breath.  COVID induced pneumonia.  EXAM: CHEST - 2 VIEW COMPARISON:  Chest x-ray 03/01/2020, CT chest 03/18/2020 FINDINGS: The heart size and mediastinal contours are within normal limits. No focal consolidation. No pulmonary edema. No pleural effusion. No pneumothorax. No acute osseous abnormality. IMPRESSION: No active cardiopulmonary disease. Electronically Signed   By: Iven Finn M.D.   On: 04/04/2020 05:23   CT Angio Chest PE W and/or Wo Contrast  Result Date: 04/04/2020 CLINICAL DATA:  Chest pain.  Abdomen pain. EXAM: CT ANGIOGRAPHY CHEST CT ABDOMEN AND PELVIS WITH CONTRAST TECHNIQUE: Multidetector CT imaging of the chest was performed using the standard protocol during bolus administration of intravenous contrast. Multiplanar CT image reconstructions and MIPs were obtained to evaluate the vascular anatomy. Multidetector CT imaging of the abdomen and pelvis was performed using the standard protocol during bolus administration of intravenous contrast. CONTRAST:  193m OMNIPAQUE IOHEXOL 350 MG/ML SOLN COMPARISON:  None. CT abdomen and pelvis November 13, 2018 1; chest CT March 18, 2020 FINDINGS: CTA CHEST FINDINGS Cardiovascular: There is no evident pulmonary embolus. No thoracic aortic aneurysm or dissection. Visualized great vessels appear normal. No thoracic aortic aneurysm or dissection. Mediastinum/Nodes: Thyroid appears normal. Small amount of thymic tissue is normal for age. No evident thoracic adenopathy. No esophageal lesions evident. Lungs/Pleura: Interval clearing of infiltrate compared to prior CT. Lungs now clear. No pleural effusion. No pneumothorax. Trachea and major bronchial structures appear patent. Musculoskeletal: No blastic or lytic bone lesions. Review of the MIP images confirms the above findings. CT ABDOMEN and PELVIS FINDINGS Hepatobiliary: There is a 5 mm cyst in the anterior segment right lobe of the liver. No other focal liver lesions are evident. Gallbladder is absent. There is no appreciable biliary  dilatation. Pancreas: There is no pancreatic mass or inflammatory focus. Spleen: No splenic lesions are evident. Adrenals/Urinary Tract: Adrenals bilaterally appear normal. There is no appreciable renal mass or hydronephrosis on either side. There is no evident renal or ureteral calculus on either side. Urinary bladder is midline with wall thickness within normal limits. Stomach/Bowel: There is no appreciable bowel wall or mesenteric thickening. Moderate stool noted in the colon. No bowel obstruction. Terminal ileum appears normal. No free air or portal venous air. Appendix absent. No periappendiceal region inflammation. Vascular/Lymphatic: No abdominal aortic aneurysm. No arterial vascular lesions are evident. Major venous structures appear patent. There is no evident adenopathy in the abdomen or pelvis. Reproductive: The uterus is anteverted.  No evident adnexal mass. Other: No abscess or ascites in the abdomen or pelvis. Musculoskeletal: No blastic or lytic bone lesions. No abdominal wall or intramuscular lesions. Review of the MIP images confirms the above findings. IMPRESSION: CT angiogram chest: 1. No demonstrable pulmonary embolus. No thoracic aortic aneurysm or dissection. 2.  Lungs now clear. 3.  No adenopathy. CT abdomen and pelvis: 1. A cause for patient's symptoms has not been established with this study. 2. No bowel wall thickening or bowel obstruction. No abscess in the abdomen or pelvis. Appendix absent. 3.  Gallbladder absent. 4. No evident renal or ureteral calculus. No hydronephrosis. Urinary bladder wall thickness normal. Electronically Signed   By: WLowella GripIII M.D.   On: 04/04/2020 10:17   CT  ABDOMEN PELVIS W CONTRAST  Result Date: 04/04/2020 CLINICAL DATA:  Chest pain.  Abdomen pain. EXAM: CT ANGIOGRAPHY CHEST CT ABDOMEN AND PELVIS WITH CONTRAST TECHNIQUE: Multidetector CT imaging of the chest was performed using the standard protocol during bolus administration of intravenous  contrast. Multiplanar CT image reconstructions and MIPs were obtained to evaluate the vascular anatomy. Multidetector CT imaging of the abdomen and pelvis was performed using the standard protocol during bolus administration of intravenous contrast. CONTRAST:  132m OMNIPAQUE IOHEXOL 350 MG/ML SOLN COMPARISON:  None. CT abdomen and pelvis November 13, 2018 1; chest CT March 18, 2020 FINDINGS: CTA CHEST FINDINGS Cardiovascular: There is no evident pulmonary embolus. No thoracic aortic aneurysm or dissection. Visualized great vessels appear normal. No thoracic aortic aneurysm or dissection. Mediastinum/Nodes: Thyroid appears normal. Small amount of thymic tissue is normal for age. No evident thoracic adenopathy. No esophageal lesions evident. Lungs/Pleura: Interval clearing of infiltrate compared to prior CT. Lungs now clear. No pleural effusion. No pneumothorax. Trachea and major bronchial structures appear patent. Musculoskeletal: No blastic or lytic bone lesions. Review of the MIP images confirms the above findings. CT ABDOMEN and PELVIS FINDINGS Hepatobiliary: There is a 5 mm cyst in the anterior segment right lobe of the liver. No other focal liver lesions are evident. Gallbladder is absent. There is no appreciable biliary dilatation. Pancreas: There is no pancreatic mass or inflammatory focus. Spleen: No splenic lesions are evident. Adrenals/Urinary Tract: Adrenals bilaterally appear normal. There is no appreciable renal mass or hydronephrosis on either side. There is no evident renal or ureteral calculus on either side. Urinary bladder is midline with wall thickness within normal limits. Stomach/Bowel: There is no appreciable bowel wall or mesenteric thickening. Moderate stool noted in the colon. No bowel obstruction. Terminal ileum appears normal. No free air or portal venous air. Appendix absent. No periappendiceal region inflammation. Vascular/Lymphatic: No abdominal aortic aneurysm. No arterial vascular  lesions are evident. Major venous structures appear patent. There is no evident adenopathy in the abdomen or pelvis. Reproductive: The uterus is anteverted.  No evident adnexal mass. Other: No abscess or ascites in the abdomen or pelvis. Musculoskeletal: No blastic or lytic bone lesions. No abdominal wall or intramuscular lesions. Review of the MIP images confirms the above findings. IMPRESSION: CT angiogram chest: 1. No demonstrable pulmonary embolus. No thoracic aortic aneurysm or dissection. 2.  Lungs now clear. 3.  No adenopathy. CT abdomen and pelvis: 1. A cause for patient's symptoms has not been established with this study. 2. No bowel wall thickening or bowel obstruction. No abscess in the abdomen or pelvis. Appendix absent. 3.  Gallbladder absent. 4. No evident renal or ureteral calculus. No hydronephrosis. Urinary bladder wall thickness normal. Electronically Signed   By: WLowella GripIII M.D.   On: 04/04/2020 10:17   ECHOCARDIOGRAM COMPLETE  Result Date: 04/05/2020    ECHOCARDIOGRAM REPORT   Patient Name:   DLaurian BrimBELL Date of Exam: 04/05/2020 Medical Rec #:  [id number]        Height:       62.0 in Accession #:    [id number]       Weight:       220.0 lb Date of Birth:  [birth year]        BSA:          1.991 m Patient Age:    24 years          BP:  128/75 mmHg Patient Gender: F                 HR:           85 bpm. Exam Location:  Inpatient Procedure: 2D Echo, Cardiac Doppler and Color Doppler Indications:    R06.02 SOB  History:        Patient has no prior history of Echocardiogram examinations.                 Signs/Symptoms:Shortness of Breath, Dyspnea and Chest Pain.                 Lupus.  Sonographer:    Roseanna Rainbow RDCS Referring Phys: [phone] MATTHEW R HUNSUCKER  Sonographer Comments: Technically difficult study due to poor echo windows and patient is morbidly obese. Image acquisition challenging due to patient body habitus. IMPRESSIONS  1. Left ventricular ejection fraction,  by estimation, is 60 to 65%. The left ventricle has normal function. The left ventricle has no regional wall motion abnormalities. Left ventricular diastolic parameters were normal.  2. Right ventricular systolic function is normal. The right ventricular size is normal. Tricuspid regurgitation signal is inadequate for assessing PA pressure.  3. The mitral valve is normal in structure. No evidence of mitral valve regurgitation. No evidence of mitral stenosis.  4. The aortic valve is normal in structure. Aortic valve regurgitation is not visualized. No aortic stenosis is present.  5. The inferior vena cava is normal in size with greater than 50% respiratory variability, suggesting right atrial pressure of 3 mmHg. FINDINGS  Left Ventricle: Left ventricular ejection fraction, by estimation, is 60 to 65%. The left ventricle has normal function. The left ventricle has no regional wall motion abnormalities. The left ventricular internal cavity size was normal in size. There is  borderline concentric left ventricular hypertrophy. Left ventricular diastolic parameters were normal. Normal left ventricular filling pressure. Right Ventricle: The right ventricular size is normal. No increase in right ventricular wall thickness. Right ventricular systolic function is normal. Tricuspid regurgitation signal is inadequate for assessing PA pressure. Left Atrium: Left atrial size was normal in size. Right Atrium: Right atrial size was normal in size. Pericardium: There is no evidence of pericardial effusion. Mitral Valve: The mitral valve is normal in structure. No evidence of mitral valve regurgitation. No evidence of mitral valve stenosis. Tricuspid Valve: The tricuspid valve is normal in structure. Tricuspid valve regurgitation is not demonstrated. No evidence of tricuspid stenosis. Aortic Valve: The aortic valve is normal in structure. Aortic valve regurgitation is not visualized. No aortic stenosis is present. Pulmonic Valve: The  pulmonic valve was normal in structure. Pulmonic valve regurgitation is not visualized. No evidence of pulmonic stenosis. Aorta: The aortic root is normal in size and structure. Venous: The inferior vena cava is normal in size with greater than 50% respiratory variability, suggesting right atrial pressure of 3 mmHg. IAS/Shunts: No atrial level shunt detected by color flow Doppler.  LEFT VENTRICLE PLAX 2D LVIDd:         3.76 cm     Diastology LVIDs:         2.47 cm     LV e' medial:    6.96 cm/s LV PW:         1.27 cm     LV E/e' medial:  13.0 LV IVS:        1.35 cm     LV e' lateral:   12.90 cm/s LVOT diam:     1.90  cm     LV E/e' lateral: 7.0 LV SV:         68 LV SV Index:   34 LVOT Area:     2.84 cm  LV Volumes (MOD) LV vol d, MOD A2C: 79.4 ml LV vol d, MOD A4C: 90.0 ml LV vol s, MOD A2C: 34.3 ml LV vol s, MOD A4C: 32.0 ml LV SV MOD A2C:     45.1 ml LV SV MOD A4C:     90.0 ml LV SV MOD BP:      51.9 ml RIGHT VENTRICLE             IVC RV S prime:     11.40 cm/s  IVC diam: 1.44 cm TAPSE (M-mode): 1.9 cm LEFT ATRIUM         Index LA diam:    3.40 cm 1.71 cm/m  AORTIC VALVE             PULMONIC VALVE LVOT Vmax:   122.00 cm/s PR End Diast Vel: 1.51 msec LVOT Vmean:  79.200 cm/s LVOT VTI:    0.239 m  AORTA Ao Root diam: 2.70 cm Ao Asc diam:  2.90 cm MITRAL VALVE MV Area (PHT): 6.32 cm    SHUNTS MV Decel Time: 120 msec    Systemic VTI:  0.24 m MV E velocity: 90.60 cm/s  Systemic Diam: 1.90 cm MV A velocity: 61.20 cm/s MV E/A ratio:  1.48 Mihai Croitoru MD Electronically signed by Sanda Klein MD Signature Date/Time: 04/05/2020/5:10:04 PM    Final     EKG None  Radiology DG Chest 2 View  Result Date: 04/30/2020 CLINICAL DATA:  Cough. EXAM: CHEST - 2 VIEW COMPARISON:  April 04, 2020 FINDINGS: The heart size and mediastinal contours are within normal limits. Both lungs are clear. The visualized skeletal structures are unremarkable. Radiopaque surgical clips are seen within the right upper quadrant. IMPRESSION:  No active cardiopulmonary disease. Electronically Signed   By: Virgina Norfolk M.D.   On: 04/30/2020 19:55    Procedures Procedures   Medications Ordered in ED Medications  HYDROmorphone (DILAUDID) injection 1 mg (1 mg Intramuscular Given 04/30/20 1914)  ondansetron (ZOFRAN-ODT) disintegrating tablet 8 mg (8 mg Oral Given 04/30/20 1914)    ED Course  I have reviewed the triage vital signs and the nursing notes.  Pertinent labs & imaging results that were available during my care of the patient were reviewed by me and considered in my medical decision making (see chart for details).    MDM Rules/Calculators/A&P                         Labs sent.   Reviewed nursing notes and prior charts for additional history.   Patient also inquires about acetaminophen level. States took a total of 10 tablets in past 24 hours, and states took a total of 5 tablets 3-4 hrs ago - denies any thoughts of self harm or SI, was just medicating for menstrual cramps, and then got to thinking that may be too much.    ~ 4 hr acetaminophen level 24 - not in toxic range.   Discussed w pt, only to take as recommended/directed, and not to exceed that dose.   Dilaudid 1 mg im. Po fluids.   Labs reviewed/interpreted by me - wbc normal, hct 35.   Pain improved. Abd soft non tender. Afebrile. No increased wob or wheezing.   CXR reviewed/interpreted by me - no pna.  Pt currently appears stable for d/c.   Rec pcp and pulm f/u.  Return precautions provided.    Final Clinical Impression(s) / ED Diagnoses Final diagnoses:  None    Rx / DC Orders ED Discharge Orders    None           Lajean Saver, MD 04/30/20 2110

## 2020-04-30 NOTE — ED Triage Notes (Signed)
Pt presents with cough x 2 months. Tussionex gives relief.   Pt reports she fainted today 1 time. Pt reports her menstrual period started yesterday and she faints when having menstrual period.

## 2020-04-30 NOTE — ED Triage Notes (Signed)
She c/o generalized abd. "period cramping" x 2 days. She also states she has endometriosis, and this type of pain is not unusual with her periods. She also states she cannot tolerate pelvic exams and will decline to have them performed. She is in no distress.

## 2020-05-05 ENCOUNTER — Emergency Department (HOSPITAL_BASED_OUTPATIENT_CLINIC_OR_DEPARTMENT_OTHER): Payer: PPO

## 2020-05-05 ENCOUNTER — Encounter (HOSPITAL_BASED_OUTPATIENT_CLINIC_OR_DEPARTMENT_OTHER): Payer: Self-pay | Admitting: Emergency Medicine

## 2020-05-05 ENCOUNTER — Emergency Department (HOSPITAL_BASED_OUTPATIENT_CLINIC_OR_DEPARTMENT_OTHER)
Admission: EM | Admit: 2020-05-05 | Discharge: 2020-05-06 | Disposition: A | Discharge status: Home / Self Care | Payer: PPO | Attending: Emergency Medicine | Admitting: Emergency Medicine

## 2020-05-05 ENCOUNTER — Other Ambulatory Visit: Payer: Self-pay

## 2020-05-05 DIAGNOSIS — Z7951 Long term (current) use of inhaled steroids: Secondary | ICD-10-CM | POA: Insufficient documentation

## 2020-05-05 DIAGNOSIS — J45909 Unspecified asthma, uncomplicated: Secondary | ICD-10-CM | POA: Diagnosis not present

## 2020-05-05 DIAGNOSIS — Z91010 Allergy to peanuts: Secondary | ICD-10-CM | POA: Diagnosis not present

## 2020-05-05 DIAGNOSIS — Z7901 Long term (current) use of anticoagulants: Secondary | ICD-10-CM | POA: Insufficient documentation

## 2020-05-05 DIAGNOSIS — R1030 Lower abdominal pain, unspecified: Secondary | ICD-10-CM | POA: Insufficient documentation

## 2020-05-05 LAB — URINALYSIS, ROUTINE W REFLEX MICROSCOPIC
Bilirubin Urine: NEGATIVE
Glucose, UA: NEGATIVE mg/dL
Ketones, ur: NEGATIVE mg/dL
Nitrite: NEGATIVE
Specific Gravity, Urine: 1.021 (ref 1.005–1.030)
pH: 5.5 (ref 5.0–8.0)

## 2020-05-05 LAB — COMPREHENSIVE METABOLIC PANEL
ALT: 12 U/L (ref 0–44)
AST: 16 U/L (ref 15–41)
Albumin: 4.4 g/dL (ref 3.5–5.0)
Alkaline Phosphatase: 74 U/L (ref 38–126)
Anion gap: 10 (ref 5–15)
BUN: 6 mg/dL (ref 6–20)
CO2: 23 mmol/L (ref 22–32)
Calcium: 9.7 mg/dL (ref 8.9–10.3)
Chloride: 107 mmol/L (ref 98–111)
Creatinine, Ser: 0.76 mg/dL (ref 0.44–1.00)
GFR, Estimated: >60 mL/min (ref >60)
Glucose, Bld: 87 mg/dL (ref 70–99)
Potassium: 3.5 mmol/L (ref 3.5–5.1)
Sodium: 140 mmol/L (ref 135–145)
Total Bilirubin: 0.3 mg/dL (ref 0.3–1.2)
Total Protein: 7.8 g/dL (ref 6.5–8.1)

## 2020-05-05 LAB — PREGNANCY, URINE: Preg Test, Ur: NEGATIVE

## 2020-05-05 LAB — CBC
HCT: 38.6 % (ref 36.0–46.0)
Hemoglobin: 12 g/dL (ref 12.0–15.0)
MCH: 22.4 pg — ABNORMAL LOW (ref 26.0–34.0)
MCHC: 31.1 g/dL (ref 30.0–36.0)
MCV: 72 fL — ABNORMAL LOW (ref 80.0–100.0)
Platelets: 567 10*3/uL — ABNORMAL HIGH (ref 150–400)
RBC: 5.36 MIL/uL — ABNORMAL HIGH (ref 3.87–5.11)
RDW: 17.4 % — ABNORMAL HIGH (ref 11.5–15.5)
WBC: 9.8 10*3/uL (ref 4.0–10.5)
nRBC: 0 % (ref 0.0–0.2)

## 2020-05-05 LAB — LIPASE, BLOOD: Lipase: 38 U/L (ref 11–51)

## 2020-05-05 MED ORDER — HYDROMORPHONE HCL 1 MG/ML IJ SOLN
1.0000 mg | Freq: Once | INTRAMUSCULAR | Status: AC
  Administered 2020-05-05: 1 mg via INTRAVENOUS
  Filled 2020-05-05: qty 1

## 2020-05-05 MED ORDER — HYDROCORTISONE NA SUCCINATE PF 250 MG IJ SOLR: 200.0000 mg | Freq: Once | INTRAMUSCULAR | Status: DC

## 2020-05-05 MED ORDER — HYDROCORTISONE NA SUCCINATE PF 100 MG IJ SOLR
200.0000 mg | Freq: Once | INTRAMUSCULAR | Status: AC
  Administered 2020-05-05: 200 mg via INTRAVENOUS
  Filled 2020-05-05: qty 4

## 2020-05-05 MED ORDER — IOHEXOL 300 MG/ML  SOLN
100.0000 mL | Freq: Once | INTRAMUSCULAR | Status: AC | PRN
  Administered 2020-05-05: 100 mL via INTRAVENOUS

## 2020-05-05 MED ORDER — DIPHENHYDRAMINE HCL 25 MG PO CAPS: 50.0000 mg | ORAL_CAPSULE | Freq: Once | ORAL | Status: AC

## 2020-05-05 MED ORDER — ONDANSETRON HCL 4 MG/2ML IJ SOLN
4.0000 mg | Freq: Once | INTRAMUSCULAR | Status: AC
  Administered 2020-05-05: 4 mg via INTRAVENOUS
  Filled 2020-05-05: qty 2

## 2020-05-05 MED ORDER — SODIUM CHLORIDE 0.9 % IV BOLUS
1000.0000 mL | Freq: Once | INTRAVENOUS | Status: AC
  Administered 2020-05-05: 1000 mL via INTRAVENOUS

## 2020-05-05 MED ORDER — HYDROMORPHONE HCL 1 MG/ML IJ SOLN
1.0000 mg | Freq: Once | INTRAMUSCULAR | Status: AC
Start: 2020-05-05 — End: 2020-05-05
  Administered 2020-05-05: 1 mg via INTRAVENOUS
  Filled 2020-05-05: qty 1

## 2020-05-05 MED ORDER — DIPHENHYDRAMINE HCL 50 MG/ML IJ SOLN
50.0000 mg | Freq: Once | INTRAMUSCULAR | Status: AC
  Administered 2020-05-05: 50 mg via INTRAVENOUS
  Filled 2020-05-05: qty 1

## 2020-05-05 NOTE — ED Provider Notes (Signed)
[name] EMERGENCY DEPT Provider Note   CSN: [id number] Arrival date & time: 05/05/20  1534     History Chief Complaint  Patient presents with  . Abdominal Pain    [name] is a 25 y.o. female.  25 yo F with a chief complaints of severe crampy lower abdominal pain.  This is been off and on for a couple weeks now.  She tells me she has not been able to leave the house due to pain and vomiting.  Having fevers off and on as well.  Her mom came up from [place] to help take care of her and encouraged her to come to the hospital today.  She thinks she had symptoms similar when she had a ovarian cyst that required surgical removal.  She denies any urinary symptoms.  Denies constipation or diarrhea.  Denies trauma.  Had some vaginal bleeding at the onset and then has had some improvement.  Does not typically have menstrual cycles due to the birth control she is on.  The history is provided by the patient and a parent.  Abdominal Pain Pain location:  Suprapubic Pain quality: cramping   Pain radiates to:  Does not radiate Pain severity:  Moderate Onset quality:  Gradual Duration:  2 weeks Timing:  Constant Progression:  Worsening Chronicity:  New Relieved by:  Nothing Worsened by:  Nothing Ineffective treatments:  None tried Associated symptoms: no chest pain, no chills, no dysuria, no fever, no nausea, no shortness of breath and no vomiting        Past Medical History:  Diagnosis Date  . Asthma   . Celiac disease   . Collagen vascular disease ([place])   . Diarrhea    Patient mentions diagnosis of ulcerative colitis but it is not clear she actually has UC  . Endometriosis   . Lupus ([place])   . Ovarian cyst   . Pulmonary embolus ([place]) 02/06/2020    Patient Active Problem List   Diagnosis Date Noted  . Obesity, Class III, BMI 40-49.9 (morbid obesity) ([place]) 04/05/2020  . SOB (shortness of breath) 04/05/2020  . Abdominal pain 04/04/2020  . SLE (systemic  lupus erythematosus related syndrome) ([place]) 04/04/2020  . Shortness of breath 04/04/2020  . Pneumonia 03/23/2020  . Chronic nausea 03/23/2020  . Asthma 03/22/2020  . Dysuria 02/03/2020  . Hematuria 02/03/2020  . Mass of urinary bladder 01/26/2020  . Allergic rhinitis 05/05/2019  . 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 Endometriosis 03/14/2018  . Long-term use of high-risk medication 07/02/2017  . Lupus anticoagulant positive 07/02/2017  . Personal history of pulmonary embolism 07/02/2017  . Other forms of systemic lupus erythematosus ([place]) 03/12/2017  . Anemia 02/28/2017  . Migraine 02/28/2017  . Dysmenorrhea 08/28/2011  . Gastritis 08/28/2011    Past Surgical History:  Procedure Laterality Date  . APPENDECTOMY    . CHOLECYSTECTOMY    . EXCISION OF ENDOMETRIOMA     Ovarian cyst removal  . FOOT FRACTURE SURGERY     w/ hardware  . HERNIA REPAIR    . TONSILLECTOMY       OB History   No obstetric history on file.     Family History  Problem Relation Age of Onset  . Hypertension Mother   . Hypercholesterolemia Mother   . Rheum arthritis Mother   . Diabetes Father   . Hypertension Father   . Cancer Father   . Autism Brother     Social History   Tobacco Use  . Smoking  status: Never Smoker  . Smokeless tobacco: Never Used  Vaping Use  . Vaping Use: Never used  Substance Use Topics  . Alcohol use: Never  . Drug use: Never    Home Medications Prior to Admission medications   Medication Sig Start Date End Date Taking? Authorizing Provider  albuterol (PROVENTIL) (2.5 MG/3ML) 0.083% nebulizer solution Take 3 mLs (2.5 mg total) by nebulization every 6 (six) hours as needed for wheezing or shortness of breath. 03/19/20   Volney American, PA-C  ALPRAZolam Duanne Moron) 0.5 MG tablet Take 0.25-0.5 mg by mouth daily as needed for anxiety.    [provider]  apixaban (ELIQUIS) 5 MG TABS tablet Take 5 mg by mouth 2 (two) times daily.    [provider]   arformoterol (BROVANA) 15 MCG/2ML NEBU Take 2 mLs (15 mcg total) by nebulization 2 (two) times daily. 04/06/20   Oswald Hillock, MD  budesonide (PULMICORT) 0.5 MG/2ML nebulizer solution Take 2 mLs (0.5 mg total) by nebulization 2 (two) times daily. 04/06/20   Oswald Hillock, MD  chlorpheniramine-HYDROcodone (TUSSIONEX PENNKINETIC ER) 10-8 MG/5ML SUER Take 5 mLs by mouth every 12 (twelve) hours as needed for cough. 04/25/20   Volney American, PA-C  hydroxychloroquine (PLAQUENIL) 200 MG tablet Take 2 tablets (400 mg total) by mouth daily. 03/23/20   Rice, Resa Miner, MD  Norethindrone-Ethinyl Estradiol-Fe Biphas (LO LOESTRIN FE) 1 MG-10 MCG / 10 MCG tablet Take 1 tablet by mouth daily.    [provider]  pantoprazole (PROTONIX) 40 MG tablet Take 1 tablet (40 mg total) by mouth daily at 6 (six) AM. 04/07/20   Oswald Hillock, MD  revefenacin (YUPELRI) 175 MCG/3ML nebulizer solution Take 3 mLs (175 mcg total) by nebulization daily. 04/07/20   Oswald Hillock, MD  sertraline (ZOLOFT) 100 MG tablet Take 200 mg by mouth at bedtime.    [provider]  sucralfate (CARAFATE) 1 GM/10ML suspension Take 10 mLs (1 g total) by mouth 2 (two) times daily. 04/06/20   Oswald Hillock, MD  tiotropium (SPIRIVA) 18 MCG inhalation capsule Place 18 mcg into inhaler and inhale daily.    [provider]  traMADol (ULTRAM) 50 MG tablet Take 1 tablet (50 mg total) by mouth every 6 (six) hours as needed. 04/30/20   Lajean Saver, MD  dicyclomine (BENTYL) 20 MG tablet Take 1 tablet (20 mg total) by mouth 2 (two) times daily. 11/29/18 04/11/19  Muthersbaugh, Jarrett Soho, PA-C  Fluticasone Propionate HFA (FLOVENT HFA IN) Inhale into the lungs. Patient not taking: Reported on 11/14/2019  11/14/19  [provider]    Allergies    Azithromycin, Peanut-containing drug products, Iodinated diagnostic agents, Other, Amoxicillin, Fish allergy, Gluten meal, Hydromet [hydrocodone-homatropine], Ibuprofen, and  Pantoprazole  Review of Systems   Review of Systems  Constitutional: Negative for chills and fever.  HENT: Negative for congestion and rhinorrhea.   Eyes: Negative for redness and visual disturbance.  Respiratory: Negative for shortness of breath and wheezing.   Cardiovascular: Negative for chest pain and palpitations.  Gastrointestinal: Positive for abdominal pain. Negative for nausea and vomiting.  Genitourinary: Positive for pelvic pain. Negative for dysuria and urgency.  Musculoskeletal: Negative for arthralgias and myalgias.  Skin: Negative for pallor and wound.  Neurological: Negative for dizziness and headaches.    Physical Exam Updated Vital Signs BP (!) 142/77 (BP Location: Right Arm)   Pulse 73   Temp 98.3 F (36.8 C) (Oral)   Resp 20   Ht 5'  2" (1.575 m)   Wt 99.8 kg   LMP 04/29/2020 (Exact Date) Comment: neg preg test  SpO2 100%   BMI 40.24 kg/m   Physical Exam Vitals and nursing note reviewed.  Constitutional:      General: She is not in acute distress.    Appearance: She is well-developed. She is not diaphoretic.     Comments: BMI 40  HENT:     Head: Normocephalic and atraumatic.  Eyes:     Pupils: Pupils are equal, round, and reactive to light.  Cardiovascular:     Rate and Rhythm: Normal rate and regular rhythm.     Heart sounds: No murmur heard. No friction rub. No gallop.   Pulmonary:     Effort: Pulmonary effort is normal.     Breath sounds: No wheezing or rales.  Abdominal:     General: There is no distension.     Palpations: Abdomen is soft.     Tenderness: There is no abdominal tenderness.     Comments: Benign abdominal exam  Musculoskeletal:        General: No tenderness.     Cervical back: Normal range of motion and neck supple.  Skin:    General: Skin is warm and dry.  Neurological:     Mental Status: She is alert and oriented to person, place, and time.  Psychiatric:        Behavior: Behavior normal.     ED Results / Procedures  / Treatments   Labs (all labs ordered are listed, but only abnormal results are displayed) Labs Reviewed  CBC - Abnormal; Notable for the following components:      Result Value   RBC 5.36 (*)    MCV 72.0 (*)    MCH 22.4 (*)    RDW 17.4 (*)    Platelets 567 (*)    All other components within normal limits  URINALYSIS, ROUTINE W REFLEX MICROSCOPIC - Abnormal; Notable for the following components:   Hgb urine dipstick MODERATE (*)    Protein, ur TRACE (*)    Leukocytes,Ua SMALL (*)    Bacteria, UA RARE (*)    All other components within normal limits  LIPASE, BLOOD  COMPREHENSIVE METABOLIC PANEL  PREGNANCY, URINE    EKG None  Radiology CT ABDOMEN PELVIS W CONTRAST  Result Date: 05/05/2020 CLINICAL DATA:  Abdominal pain and vomiting x1 week. EXAM: CT ABDOMEN AND PELVIS WITH CONTRAST TECHNIQUE: Multidetector CT imaging of the abdomen and pelvis was performed using the standard protocol following bolus administration of intravenous contrast. CONTRAST:  129m OMNIPAQUE IOHEXOL 300 MG/ML  SOLN COMPARISON:  April 04, 2020 FINDINGS: Lower chest: No acute abnormality. Hepatobiliary: A stable 7 mm focus of parenchymal low attenuation is seen within the right lobe of the liver. Status post cholecystectomy. No biliary dilatation. Pancreas: Unremarkable. No pancreatic ductal dilatation or surrounding inflammatory changes. Spleen: Normal in size without focal abnormality. Adrenals/Urinary Tract: Adrenal glands are unremarkable. Kidneys are normal, without renal calculi, focal lesion, or hydronephrosis. Bladder is unremarkable. Stomach/Bowel: Stomach is within normal limits. The appendix is surgically absent. No evidence of bowel wall thickening, distention, or inflammatory changes. Vascular/Lymphatic: No significant vascular findings are present. No enlarged abdominal or pelvic lymph nodes. Reproductive: Uterus is positioned to the left of midline and is otherwise normal in appearance. Subcentimeter  cysts are seen within the left adnexa. Other: No abdominal wall hernia or abnormality. No abdominopelvic ascites. Musculoskeletal: No acute or significant osseous findings. IMPRESSION: 1.   Evidence  of prior cholecystectomy. 2.   Small, stable hepatic cyst. 3. No acute or active process within the abdomen or pelvis. Electronically Signed   By: Virgina Norfolk M.D.   On: 05/05/2020 23:41    Procedures Procedures   Medications Ordered in ED Medications  sodium chloride 0.9 % bolus 1,000 mL (0 mLs Intravenous Stopped 05/05/20 1858)  ondansetron (ZOFRAN) injection 4 mg (4 mg Intravenous Given 05/05/20 1751)  HYDROmorphone (DILAUDID) injection 1 mg (1 mg Intravenous Given 05/05/20 1755)  diphenhydrAMINE (BENADRYL) capsule 50 mg ( Oral See Alternative 05/05/20 2212)    Or  diphenhydrAMINE (BENADRYL) injection 50 mg (50 mg Intravenous Given 05/05/20 2212)  hydrocortisone sodium succinate (SOLU-CORTEF) 100 MG injection 200 mg (200 mg Intravenous Given 05/05/20 1920)  HYDROmorphone (DILAUDID) injection 1 mg (1 mg Intravenous Given 05/05/20 2043)  ondansetron (ZOFRAN) injection 4 mg (4 mg Intravenous Given 05/05/20 2043)  iohexol (OMNIPAQUE) 300 MG/ML solution 100 mL (100 mLs Intravenous Contrast Given 05/05/20 2314)  HYDROmorphone (DILAUDID) injection 1 mg (1 mg Intravenous Given 05/05/20 2359)  ondansetron (ZOFRAN) injection 4 mg (4 mg Intravenous Given 05/05/20 2358)    ED Course  I have reviewed the triage vital signs and the nursing notes.  Pertinent labs & imaging results that were available during my care of the patient were reviewed by me and considered in my medical decision making (see chart for details).    MDM Rules/Calculators/A&P                          25 yo F with a chief complaint of pelvic cramping going on for a couple weeks now.  She is well-appearing and nontoxic without any abdominal tenderness though based on history she has been unable to get up and even walk around at her house.   Will obtain a CT scan.  Blood work.  Pain and nausea medicine.  Reassess.  Signed out to Dr. Leonette Monarch, please see their note for further details of care.    The patients results and plan were reviewed and discussed.   Any x-rays performed were independently reviewed by myself.   Differential diagnosis were considered with the presenting HPI.  Medications  sodium chloride 0.9 % bolus 1,000 mL (0 mLs Intravenous Stopped 05/05/20 1858)  ondansetron (ZOFRAN) injection 4 mg (4 mg Intravenous Given 05/05/20 1751)  HYDROmorphone (DILAUDID) injection 1 mg (1 mg Intravenous Given 05/05/20 1755)  diphenhydrAMINE (BENADRYL) capsule 50 mg ( Oral See Alternative 05/05/20 2212)    Or  diphenhydrAMINE (BENADRYL) injection 50 mg (50 mg Intravenous Given 05/05/20 2212)  hydrocortisone sodium succinate (SOLU-CORTEF) 100 MG injection 200 mg (200 mg Intravenous Given 05/05/20 1920)  HYDROmorphone (DILAUDID) injection 1 mg (1 mg Intravenous Given 05/05/20 2043)  ondansetron (ZOFRAN) injection 4 mg (4 mg Intravenous Given 05/05/20 2043)  iohexol (OMNIPAQUE) 300 MG/ML solution 100 mL (100 mLs Intravenous Contrast Given 05/05/20 2314)  HYDROmorphone (DILAUDID) injection 1 mg (1 mg Intravenous Given 05/05/20 2359)  ondansetron (ZOFRAN) injection 4 mg (4 mg Intravenous Given 05/05/20 2358)    Vitals:   05/05/20 2200 05/05/20 2215 05/06/20 0000 05/06/20 0038  BP: 120/74 116/80 (!) 143/76 (!) 142/77  Pulse: 93 96 98 73  Resp: 20 20 18 20   Temp:    98.3 F (36.8 C)  TempSrc:    Oral  SpO2: 96% 99% 97% 100%  Weight:      Height:        Final diagnoses:  Lower abdominal  pain     Final Clinical Impression(s) / ED Diagnoses Final diagnoses:  Lower abdominal pain    Rx / DC Orders ED Discharge Orders    None       Deno Etienne, DO 05/06/20 1542

## 2020-05-05 NOTE — ED Notes (Signed)
Pt moved to CT ambulatory

## 2020-05-05 NOTE — ED Triage Notes (Signed)
Hx of endometriosis. C/o abd cramping with vomiting x 1 week.

## 2020-05-06 NOTE — ED Notes (Signed)
Pt came back from CT

## 2020-05-06 NOTE — ED Provider Notes (Signed)
CT negative. Pain controlled.  The patient appears reasonably screened and/or stabilized for discharge and I doubt any other medical condition or other [facility] requiring further screening, evaluation, or treatment in the ED at this time prior to discharge. Safe for discharge with strict return precautions.  Disposition: Discharge  Condition: Good  I have discussed the results, Dx and Tx plan with the patient/family who expressed understanding and agree(s) with the plan. Discharge instructions discussed at length. The patient/family was given strict return precautions who verbalized understanding of the instructions. No further questions at time of discharge.    ED Discharge Orders    None       Follow Up: [facility], [place] [id number] [phone]  Call    [facility] [address], [place] [id number] [place] [address] [phone] Call  to schedule an appointment for close follow up      Jorden Mahl, Grayce Sessions, MD 05/06/20 [phone]

## 2020-05-06 NOTE — ED Notes (Signed)
ED Provider at bedside. 

## 2020-05-11 ENCOUNTER — Encounter: Payer: Self-pay | Admitting: Emergency Medicine

## 2020-05-11 ENCOUNTER — Other Ambulatory Visit: Payer: Self-pay

## 2020-05-11 ENCOUNTER — Ambulatory Visit
Admission: EM | Admit: 2020-05-11 | Discharge: 2020-05-11 | Disposition: A | Discharge status: Home / Self Care | Payer: PPO | Attending: Emergency Medicine | Admitting: Emergency Medicine

## 2020-05-11 DIAGNOSIS — Z308 Encounter for other contraceptive management: Secondary | ICD-10-CM

## 2020-05-11 DIAGNOSIS — Z20822 Contact with and (suspected) exposure to covid-19: Secondary | ICD-10-CM

## 2020-05-11 MED ORDER — MONTELUKAST SODIUM 10 MG PO TABS: 10.0000 mg | ORAL_TABLET | Freq: Every day | ORAL | 0 refills | Status: DC

## 2020-05-11 MED ORDER — LO LOESTRIN FE 1 MG-10 MCG / 10 MCG PO TABS: 1.0000 | ORAL_TABLET | Freq: Every day | ORAL | 0 refills | Status: DC

## 2020-05-11 MED ORDER — HYDROCOD POLST-CPM POLST ER 10-8 MG/5ML PO SUER: 5.0000 mL | Freq: Two times a day (BID) | ORAL | 0 refills | Status: DC | PRN

## 2020-05-11 NOTE — Discharge Instructions (Signed)
Rest and fluids Tylenol as needed for headaches, body aches, fevers Tussionex for cough-use sparingly, will cause drowsiness Singulair at bedtime Refilled birth control-contact PCP for further refills/management Return if any symptoms not improving or worsening

## 2020-05-11 NOTE — ED Triage Notes (Signed)
Pt sts cough and body aches with fever; pt had positive home covid test

## 2020-05-12 NOTE — ED Provider Notes (Signed)
[facility]    CSN: [id number] Arrival date & time: 05/11/20  1629      History   Chief Complaint Chief Complaint  Patient presents with  . Cough    HPI [name] is a 25 y.o. female presenting today for evaluation of URI symptoms.  Reports associated cough congestion body aches and fever.  Reports COVID exposure over the weekend, symptoms began yesterday.  At home COVID test positive.  Reports prior COVID infection requiring hospitalization, she denies any difficulty breathing or shortness of breath at this time.  Patient is currently a Art gallery manager at [employer], requesting refill of her birth control-low Loestrin Fe.  Does not have plans to follow-up with PCP at home in [place] until July.  HPI  Past Medical History:  Diagnosis Date  . Asthma   . Celiac disease   . Collagen vascular disease ([place])   . Diarrhea    Patient mentions diagnosis of ulcerative colitis but it is not clear she actually has UC  . Endometriosis   . Lupus ([place])   . Ovarian cyst   . Pulmonary embolus ([place]) 02/06/2020    Patient Active Problem List   Diagnosis Date Noted  . Obesity, Class III, BMI 40-49.9 (morbid obesity) ([place]) 04/05/2020  . SOB (shortness of breath) 04/05/2020  . Abdominal pain 04/04/2020  . SLE (systemic lupus erythematosus related syndrome) ([place]) 04/04/2020  . Shortness of breath 04/04/2020  . Pneumonia 03/23/2020  . Chronic nausea 03/23/2020  . Asthma 03/22/2020  . Dysuria 02/03/2020  . Hematuria 02/03/2020  . Mass of urinary bladder 01/26/2020  . Allergic rhinitis 05/05/2019  . 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 Endometriosis 03/14/2018  . Long-term use of high-risk medication 07/02/2017  . Lupus anticoagulant positive 07/02/2017  . Personal history of pulmonary embolism 07/02/2017  . Other forms of systemic lupus erythematosus ([place]) 03/12/2017  . Anemia 02/28/2017  . Migraine 02/28/2017  . Dysmenorrhea 08/28/2011  . Gastritis 08/28/2011     Past Surgical History:  Procedure Laterality Date  . APPENDECTOMY    . CHOLECYSTECTOMY    . EXCISION OF ENDOMETRIOMA     Ovarian cyst removal  . FOOT FRACTURE SURGERY     w/ hardware  . HERNIA REPAIR    . TONSILLECTOMY      OB History   No obstetric history on file.      Home Medications    Prior to Admission medications   Medication Sig Start Date End Date Taking? Authorizing Provider  chlorpheniramine-HYDROcodone (TUSSIONEX PENNKINETIC ER) 10-8 MG/5ML SUER Take 5 mLs by mouth every 12 (twelve) hours as needed for cough. 05/11/20  Yes Burnadette Baskett C, PA-C  montelukast (SINGULAIR) 10 MG tablet Take 1 tablet (10 mg total) by mouth at bedtime. 05/11/20  Yes Jean Alejos C, PA-C  Norethindrone-Ethinyl Estradiol-Fe Biphas (LO LOESTRIN FE) 1 MG-10 MCG / 10 MCG tablet Take 1 tablet by mouth daily. 05/11/20 08/09/20 Yes Everado Pillsbury C, PA-C  albuterol (PROVENTIL) (2.5 MG/3ML) 0.083% nebulizer solution Take 3 mLs (2.5 mg total) by nebulization every 6 (six) hours as needed for wheezing or shortness of breath. 03/19/20   Volney American, PA-C  ALPRAZolam Duanne Moron) 0.5 MG tablet Take 0.25-0.5 mg by mouth daily as needed for anxiety.    [provider]  apixaban (ELIQUIS) 5 MG TABS tablet Take 5 mg by mouth 2 (two) times daily.    [provider]  arformoterol (BROVANA) 15 MCG/2ML NEBU Take 2 mLs (15 mcg total) by nebulization  2 (two) times daily. 04/06/20   Oswald Hillock, MD  budesonide (PULMICORT) 0.5 MG/2ML nebulizer solution Take 2 mLs (0.5 mg total) by nebulization 2 (two) times daily. 04/06/20   Oswald Hillock, MD  hydroxychloroquine (PLAQUENIL) 200 MG tablet Take 2 tablets (400 mg total) by mouth daily. 03/23/20   Collier Salina, MD  pantoprazole (PROTONIX) 40 MG tablet Take 1 tablet (40 mg total) by mouth daily at 6 (six) AM. 04/07/20   Oswald Hillock, MD  revefenacin (YUPELRI) 175 MCG/3ML nebulizer solution Take 3 mLs (175 mcg total) by nebulization daily.  04/07/20   Oswald Hillock, MD  sertraline (ZOLOFT) 100 MG tablet Take 200 mg by mouth at bedtime.    [provider]  sucralfate (CARAFATE) 1 GM/10ML suspension Take 10 mLs (1 g total) by mouth 2 (two) times daily. 04/06/20   Oswald Hillock, MD  tiotropium (SPIRIVA) 18 MCG inhalation capsule Place 18 mcg into inhaler and inhale daily.    [provider]  traMADol (ULTRAM) 50 MG tablet Take 1 tablet (50 mg total) by mouth every 6 (six) hours as needed. 04/30/20   Lajean Saver, MD  dicyclomine (BENTYL) 20 MG tablet Take 1 tablet (20 mg total) by mouth 2 (two) times daily. 11/29/18 04/11/19  Muthersbaugh, Jarrett Soho, PA-C  Fluticasone Propionate HFA (FLOVENT HFA IN) Inhale into the lungs. Patient not taking: Reported on 11/14/2019  11/14/19  [provider]    Family History Family History  Problem Relation Age of Onset  . Hypertension Mother   . Hypercholesterolemia Mother   . Rheum arthritis Mother   . Diabetes Father   . Hypertension Father   . Cancer Father   . Autism Brother     Social History Social History   Tobacco Use  . Smoking status: Never Smoker  . Smokeless tobacco: Never Used  Vaping Use  . Vaping Use: Never used  Substance Use Topics  . Alcohol use: Never  . Drug use: Never     Allergies   Azithromycin, Peanut-containing drug products, Iodinated diagnostic agents, Other, Amoxicillin, Fish allergy, Gluten meal, Hydromet [hydrocodone-homatropine], Ibuprofen, and Pantoprazole   Review of Systems Review of Systems  Constitutional: Negative for activity change, appetite change, chills, fatigue and fever.  HENT: Positive for congestion and rhinorrhea. Negative for ear pain, sinus pressure, sore throat and trouble swallowing.   Eyes: Negative for discharge and redness.  Respiratory: Positive for cough. Negative for chest tightness and shortness of breath.   Cardiovascular: Negative for chest pain.  Gastrointestinal: Negative for abdominal pain,  diarrhea, nausea and vomiting.  Musculoskeletal: Negative for myalgias.  Skin: Negative for rash.  Neurological: Negative for dizziness, light-headedness and headaches.     Physical Exam Triage Vital Signs ED Triage Vitals [05/11/20 1741]  Enc Vitals Group     BP (!) 158/102     Pulse Rate (!) 101     Resp 18     Temp 97.9 F (36.6 C)     Temp Source Oral     SpO2 97 %     Weight      Height      Head Circumference      Peak Flow      Pain Score 5     Pain Loc      Pain Edu?      Excl. in [place]?    No data found.  Updated Vital Signs BP (!) 158/102 (BP Location: Left Arm)   Pulse (!) 101  Temp 97.9 F (36.6 C) (Oral)   Resp 18   LMP 04/29/2020 (Exact Date) Comment: neg preg test  SpO2 97%   Visual Acuity Right Eye Distance:   Left Eye Distance:   Bilateral Distance:    Right Eye Near:   Left Eye Near:    Bilateral Near:     Physical Exam Vitals and nursing note reviewed.  Constitutional:      Appearance: She is well-developed.     Comments: No acute distress  HENT:     Head: Normocephalic and atraumatic.     Ears:     Comments: Bilateral ears without tenderness to palpation of external auricle, tragus and mastoid, EAC's without erythema or swelling, TM's with good bony landmarks and cone of light. Non erythematous.     Nose: Nose normal.     Mouth/Throat:     Comments: Oral mucosa pink and moist, no tonsillar enlargement or exudate. Posterior pharynx patent and nonerythematous, no uvula deviation or swelling. Normal phonation. Eyes:     Conjunctiva/sclera: Conjunctivae normal.  Cardiovascular:     Rate and Rhythm: Normal rate.  Pulmonary:     Effort: Pulmonary effort is normal. No respiratory distress.     Comments: Breathing comfortably at rest, CTABL, no wheezing, rales or other adventitious sounds auscultated Abdominal:     General: There is no distension.  Musculoskeletal:        General: Normal range of motion.     Cervical back: Neck supple.   Skin:    General: Skin is warm and dry.  Neurological:     Mental Status: She is alert and oriented to person, place, and time.      UC Treatments / Results  Labs (all labs ordered are listed, but only abnormal results are displayed) Labs Reviewed - No data to display  EKG   Radiology No results found.  Procedures Procedures (including critical care time)  Medications Ordered in UC Medications - No data to display  Initial Impression / Assessment and Plan / UC Course  I have reviewed the triage vital signs and the nursing notes.  Pertinent labs & imaging results that were available during my care of the patient were reviewed by me and considered in my medical decision making (see chart for details).     Suspected COVID infection-at home test positive, patient declined confirmation test today.  Will treat symptomatically and supportively and recommending quarantining.  Provided Tussionex for cough as she has been using Tessalon and other over-the-counter medicines without relief.  This is helped her in the past.  Refilled Singulair.  Birth control refill-provided 3 months of low Loestrin, and encouraged follow-up with PCP for further refills.  Discussed strict return precautions. Patient verbalized understanding and is agreeable with plan.  Final Clinical Impressions(s) / UC Diagnoses   Final diagnoses:  Suspected COVID-19 virus infection  Encounter for other contraceptive management     Discharge Instructions     Rest and fluids Tylenol as needed for headaches, body aches, fevers Tussionex for cough-use sparingly, will cause drowsiness Singulair at bedtime Refilled birth control-contact PCP for further refills/management Return if any symptoms not improving or worsening   ED Prescriptions    Medication Sig Dispense Auth. Provider   Norethindrone-Ethinyl Estradiol-Fe Biphas (LO LOESTRIN FE) 1 MG-10 MCG / 10 MCG tablet Take 1 tablet by mouth daily. 90 tablet  Ellsworth Waldschmidt C, PA-C   chlorpheniramine-HYDROcodone (TUSSIONEX PENNKINETIC ER) 10-8 MG/5ML SUER Take 5 mLs by mouth every 12 (twelve) hours  as needed for cough. 100 mL Birgitta Uhlir C, PA-C   montelukast (SINGULAIR) 10 MG tablet Take 1 tablet (10 mg total) by mouth at bedtime. 30 tablet Burnis Kaser, [place] C, PA-C     PDMP not reviewed this encounter.   Janith Lima, [place] 05/12/20 [phone]

## 2020-05-18 ENCOUNTER — Ambulatory Visit (HOSPITAL_BASED_OUTPATIENT_CLINIC_OR_DEPARTMENT_OTHER): Payer: PPO | Admitting: Family Medicine

## 2020-05-28 ENCOUNTER — Emergency Department (HOSPITAL_COMMUNITY)
Admission: EM | Admit: 2020-05-28 | Discharge: 2020-05-30 | Disposition: A | Discharge status: Home / Self Care | Payer: PPO | Attending: Emergency Medicine | Admitting: Emergency Medicine

## 2020-05-28 ENCOUNTER — Other Ambulatory Visit: Payer: Self-pay

## 2020-05-28 DIAGNOSIS — F1390 Sedative, hypnotic, or anxiolytic use, unspecified, uncomplicated: Secondary | ICD-10-CM | POA: Diagnosis not present

## 2020-05-28 DIAGNOSIS — F329 Major depressive disorder, single episode, unspecified: Secondary | ICD-10-CM | POA: Diagnosis present

## 2020-05-28 DIAGNOSIS — Z91010 Allergy to peanuts: Secondary | ICD-10-CM | POA: Insufficient documentation

## 2020-05-28 DIAGNOSIS — T391X2A Poisoning by 4-Aminophenol derivatives, intentional self-harm, initial encounter: Secondary | ICD-10-CM

## 2020-05-28 DIAGNOSIS — R1084 Generalized abdominal pain: Secondary | ICD-10-CM | POA: Diagnosis not present

## 2020-05-28 DIAGNOSIS — J45909 Unspecified asthma, uncomplicated: Secondary | ICD-10-CM | POA: Insufficient documentation

## 2020-05-28 DIAGNOSIS — Z7901 Long term (current) use of anticoagulants: Secondary | ICD-10-CM | POA: Insufficient documentation

## 2020-05-28 DIAGNOSIS — Z7951 Long term (current) use of inhaled steroids: Secondary | ICD-10-CM | POA: Diagnosis not present

## 2020-05-28 DIAGNOSIS — Z20822 Contact with and (suspected) exposure to covid-19: Secondary | ICD-10-CM | POA: Diagnosis not present

## 2020-05-28 DIAGNOSIS — R45851 Suicidal ideations: Secondary | ICD-10-CM

## 2020-05-28 LAB — URINALYSIS, ROUTINE W REFLEX MICROSCOPIC
Bilirubin Urine: NEGATIVE
Glucose, UA: NEGATIVE mg/dL
Hgb urine dipstick: NEGATIVE
Ketones, ur: NEGATIVE mg/dL
Nitrite: NEGATIVE
Protein, ur: NEGATIVE mg/dL
Specific Gravity, Urine: 1.028 (ref 1.005–1.030)
pH: 5 (ref 5.0–8.0)

## 2020-05-28 LAB — COMPREHENSIVE METABOLIC PANEL
ALT: 12 U/L (ref 0–44)
AST: 16 U/L (ref 15–41)
Albumin: 4.2 g/dL (ref 3.5–5.0)
Alkaline Phosphatase: 70 U/L (ref 38–126)
Anion gap: 7 (ref 5–15)
BUN: 11 mg/dL (ref 6–20)
CO2: 22 mmol/L (ref 22–32)
Calcium: 9.4 mg/dL (ref 8.9–10.3)
Chloride: 109 mmol/L (ref 98–111)
Creatinine, Ser: 0.84 mg/dL (ref 0.44–1.00)
GFR, Estimated: >60 mL/min (ref >60)
Glucose, Bld: 119 mg/dL — ABNORMAL HIGH (ref 70–99)
Potassium: 3.8 mmol/L (ref 3.5–5.1)
Sodium: 138 mmol/L (ref 135–145)
Total Bilirubin: 0.4 mg/dL (ref 0.3–1.2)
Total Protein: 7.8 g/dL (ref 6.5–8.1)

## 2020-05-28 LAB — CBC WITH DIFFERENTIAL/PLATELET
Abs Immature Granulocytes: 0.01 10*3/uL (ref 0.00–0.07)
Basophils Absolute: 0 10*3/uL (ref 0.0–0.1)
Basophils Relative: 0 %
Eosinophils Absolute: 0.2 10*3/uL (ref 0.0–0.5)
Eosinophils Relative: 3 %
HCT: 35.6 % — ABNORMAL LOW (ref 36.0–46.0)
Hemoglobin: 11.2 g/dL — ABNORMAL LOW (ref 12.0–15.0)
Immature Granulocytes: 0 %
Lymphocytes Relative: 42 %
Lymphs Abs: 3.2 10*3/uL (ref 0.7–4.0)
MCH: 22.4 pg — ABNORMAL LOW (ref 26.0–34.0)
MCHC: 31.5 g/dL (ref 30.0–36.0)
MCV: 71.3 fL — ABNORMAL LOW (ref 80.0–100.0)
Monocytes Absolute: 0.4 10*3/uL (ref 0.1–1.0)
Monocytes Relative: 5 %
Neutro Abs: 3.8 10*3/uL (ref 1.7–7.7)
Neutrophils Relative %: 50 %
Platelets: 575 10*3/uL — ABNORMAL HIGH (ref 150–400)
RBC: 4.99 MIL/uL (ref 3.87–5.11)
RDW: 17.5 % — ABNORMAL HIGH (ref 11.5–15.5)
WBC: 7.7 10*3/uL (ref 4.0–10.5)
nRBC: 0 % (ref 0.0–0.2)

## 2020-05-28 LAB — PROTIME-INR
INR: 1.1 (ref 0.8–1.2)
Prothrombin Time: 14 seconds (ref 11.4–15.2)

## 2020-05-28 LAB — RAPID URINE DRUG SCREEN, HOSP PERFORMED
Amphetamines: NOT DETECTED
Barbiturates: NOT DETECTED
Benzodiazepines: POSITIVE — AB
Cocaine: NOT DETECTED
Opiates: NOT DETECTED
Tetrahydrocannabinol: NOT DETECTED

## 2020-05-28 LAB — ACETAMINOPHEN LEVEL: Acetaminophen (Tylenol), Serum: 56 ug/mL — ABNORMAL HIGH (ref 10–30)

## 2020-05-28 LAB — RESP PANEL BY RT-PCR (FLU A&B, COVID) ARPGX2
Influenza A by PCR: NEGATIVE
Influenza B by PCR: NEGATIVE
SARS Coronavirus 2 by RT PCR: NEGATIVE

## 2020-05-28 LAB — ETHANOL: Alcohol, Ethyl (B): <10 mg/dL (ref <10)

## 2020-05-28 LAB — PREGNANCY, URINE: Preg Test, Ur: NEGATIVE

## 2020-05-28 MED ORDER — SODIUM CHLORIDE 0.9 % IV BOLUS
1000.0000 mL | Freq: Once | INTRAVENOUS | Status: AC
  Administered 2020-05-28: 1000 mL via INTRAVENOUS

## 2020-05-28 MED ORDER — ONDANSETRON HCL 4 MG/2ML IJ SOLN
4.0000 mg | Freq: Once | INTRAMUSCULAR | Status: AC
  Administered 2020-05-28: 4 mg via INTRAVENOUS
  Filled 2020-05-28: qty 2

## 2020-05-28 MED ORDER — DICYCLOMINE HCL 10 MG PO CAPS
10.0000 mg | ORAL_CAPSULE | Freq: Once | ORAL | Status: AC
  Administered 2020-05-29: 10 mg via ORAL
  Filled 2020-05-28: qty 1

## 2020-05-28 MED ORDER — CHARCOAL ACTIVATED PO LIQD
75.0000 g | Freq: Once | ORAL | Status: AC
  Administered 2020-05-28 (×2): 75 g via ORAL
  Filled 2020-05-28: qty 480

## 2020-05-28 NOTE — ED Triage Notes (Addendum)
Patient bib gems from, patient took 10,0108m of tylenol in order to attempt suicide. Patient now has abd pain. Patient has prior suicide attempt in her 89s Patient alert and oriented in triage. Patient ingested pills one hour ago.

## 2020-05-28 NOTE — ED Provider Notes (Signed)
[facility] EM[facility]ENC[facility] DEPARTMENT Provider Note  CSN: [id number] Arrival date & time: 05/28/20 1645    History Chief Complaint  Patient presents with  . Suicidal    HPI  Sylwia Cuervo is a 25 y.o. female brought to the ED from home via EMS for overdose, she reports she took at least 20 x 546m APAP tablets about an hour prior to arrival. 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 She is complaining of moderate to severe diffuse abdominal pain. She denies any other co-ingestion with alcohol or drugs.    Past Medical History:  Diagnosis Date  . Asthma   . Celiac disease   . [place]agen vascular disease ([place]   . Diarrhea    Patient mentions diagnosis of ulcerative colitis but it is not clear she actually has UC  . Endometriosis   . Lupus ([place]   . Ovarian cyst   . [place]monary embolus ([place] 02/06/2020    Past Surgical History:  Procedure Laterality Date  . APPENDECTOMY    . CHOLECYSTECTOMY    . EXCISION OF ENDOMETRIOMA     Ovarian cyst removal  . FOOT FRACTURE SURGERY     w/ hardware  . HERNIA REPAIR    . TONSILLECTOMY      Family History  Problem Relation Age of Onset  . Hypertension Mother   . Hypercholesterolemia Mother   . Rheum arthritis Mother   . Diabetes Father   . Hypertension Father   . Cancer Father   . Autism Brother     Social History   Tobacco Use  . Smoking status: Never Smoker  . Smokeless tobacco: Never Used  Vaping Use  . Vaping Use: Never used  Substance Use Topics  . Alcohol use: Never  . Drug use: Never     Home Medications Prior to Admission medications   Medication Sig Start Date End Date Taking? Authorizing Provider  ALPRAZolam (Duanne Moron 0.5 MG tablet Take 0.25-0.5 mg by mouth daily as needed for anxiety.   Yes [provider]  apixaban (ELIQUIS) 5 MG TABS tablet Take 5 mg by mouth 2 (two) times daily.   Yes [provider]  azelastine (ASTELIN) 0.1 %  nasal spray Place 1 spray into both nostrils daily as needed for allergies. 04/29/20  Yes [provider]  diphenhydrAMINE (BENADRYL) 25 MG tablet Take 25 mg by mouth every 6 (six) hours as needed for allergies.   Yes [provider]  fluticasone (FLONASE) 50 MCG/ACT nasal spray Place 1 spray into both nostrils daily as needed for allergies or rhinitis.   Yes [provider]  hydroxychloroquine (PLAQUENIL) 200 MG tablet Take 2 tablets (400 mg total) by mouth daily. Patient taking differently: Take 400 mg by mouth at bedtime. 03/23/20  Yes Rice, CResa Miner MD  Norethindrone-Ethinyl Estradiol-Fe Biphas (LO LOESTRIN FE) 1 MG-10 MCG / 10 MCG tablet Take 1 tablet by mouth daily. 05/11/20 08/09/20 Yes Wieters, Hallie C, PA-C  sertraline (ZOLOFT) 100 MG tablet Take 200 mg by mouth at bedtime.   Yes [provider]  zolpidem (AMBIEN) 10 MG tablet Take 10 mg by mouth at bedtime as needed for sleep. 05/24/20  Yes [provider]  albuterol (PROVENTIL) (2.5 MG/3ML) 0.083% nebulizer solution Take 3 mLs (2.5 mg total) by nebulization every 6 (six) hours as needed for wheezing or shortness of breath. 03/19/20   LVolney American PA-C  ALPRAZolam (Duanne Moron 1 MG tablet Take  1 mg by mouth 3 (three) times daily as needed for anxiety. 05/24/20   [provider]  arformoterol (BROVANA) 15 MCG/2ML NEBU Take 2 mLs (15 mcg total) by nebulization 2 (two) times daily. 04/06/20   Oswald Hillock, MD  budesonide (PULMICORT) 0.5 MG/2ML nebulizer solution Take 2 mLs (0.5 mg total) by nebulization 2 (two) times daily. 04/06/20   Oswald Hillock, MD  chlorpheniramine-HYDROcodone (TUSSIONEX PENNKINETIC ER) 10-8 MG/5ML SUER Take 5 mLs by mouth every 12 (twelve) hours as needed for cough. Patient not taking: Reported on 05/28/2020 05/11/20   Wieters, Hallie C, PA-C  montelukast (SINGULAIR) 10 MG tablet Take 1 tablet (10 mg total) by mouth at bedtime. Patient not taking: Reported on 05/28/2020  05/11/20   Wieters, Hallie C, PA-C  pantoprazole (PROTONIX) 40 MG tablet Take 1 tablet (40 mg total) by mouth daily at 6 (six) AM. Patient not taking: Reported on 05/28/2020 04/07/20   Oswald Hillock, MD  revefenacin (YUPELRI) 175 MCG/3ML nebulizer solution Take 3 mLs (175 mcg total) by nebulization daily. 04/07/20   Oswald Hillock, MD  sucralfate (CARAFATE) 1 GM/10ML suspension Take 10 mLs (1 g total) by mouth 2 (two) times daily. 04/06/20   Oswald Hillock, MD  tiotropium (SPIRIVA) 18 MCG inhalation capsule Place 18 mcg into inhaler and inhale daily.    [provider]  traMADol (ULTRAM) 50 MG tablet Take 1 tablet (50 mg total) by mouth every 6 (six) hours as needed. Patient not taking: Reported on 05/28/2020 04/30/20   Lajean Saver, MD  dicyclomine (BENTYL) 20 MG tablet Take 1 tablet (20 mg total) by mouth 2 (two) times daily. 11/29/18 04/11/19  Muthersbaugh, Jarrett Soho, PA-C  Fluticasone Propionate HFA (FLOVENT HFA IN) Inhale into the lungs. Patient not taking: Reported on 11/14/2019  11/14/19  [provider]     Allergies    Azithromycin, Peanut-containing drug products, Iodinated diagnostic agents, Other, Amoxicillin, Fish allergy, Gluten meal, Hydromet [hydrocodone bit-homatrop mbr], Ibuprofen, and Pantoprazole   Review of Systems   Review of Systems A comprehensive review of systems was completed and negative except as noted in HPI.    Physical Exam BP (!) 122/91   Pulse (!) 109   Temp 99.1 F (37.3 C) (Oral)   Resp 18   Ht 5' 2"  (1.575 m)   Wt 99.8 kg   LMP 04/29/2020 (Exact Date)   SpO2 99%   BMI 40.24 kg/m   Physical Exam Vitals and nursing note reviewed.  Constitutional:      Appearance: Normal appearance. She is obese.  HENT:     Head: Normocephalic and atraumatic.     Nose: Nose normal.     Mouth/Throat:     Mouth: Mucous membranes are moist.  Eyes:     Extraocular Movements: Extraocular movements intact.     Conjunctiva/sclera: Conjunctivae normal.   Cardiovascular:     Rate and Rhythm: Normal rate.  Pulmonary:     Effort: Pulmonary effort is normal.     Breath sounds: Normal breath sounds.  Abdominal:     General: Abdomen is flat.     Palpations: Abdomen is soft.     Tenderness: There is abdominal tenderness (diffuse). There is no guarding.  Musculoskeletal:        General: No swelling. Normal range of motion.     Cervical back: Neck supple.  Skin:    General: Skin is warm and dry.  Neurological:     General: No focal deficit present.  Mental Status: She is alert.  Psychiatric:     Comments: Flat affect, calm and cooperative      ED Results / Procedures / Treatments   Labs (all labs ordered are listed, but only abnormal results are displayed) Labs Reviewed  COMPREHENSIVE METABOLIC PANEL - Abnormal; Notable for the following components:      Result Value   Glucose, Bld 119 (*)    All other components within normal limits  CBC WITH DIFFERENTIAL/PLATELET - Abnormal; Notable for the following components:   Hemoglobin 11.2 (*)    HCT 35.6 (*)    MCV 71.3 (*)    MCH 22.4 (*)    RDW 17.5 (*)    Platelets 575 (*)    All other components within normal limits  URINALYSIS, ROUTINE W REFLEX MICROSCOPIC - Abnormal; Notable for the following components:   Leukocytes,Ua TRACE (*)    Bacteria, UA RARE (*)    All other components within normal limits  RAPID URINE DRUG SCREEN, HOSP PERFORMED - Abnormal; Notable for the following components:   Benzodiazepines POSITIVE (*)    All other components within normal limits  ACETAMINOPHEN LEVEL - Abnormal; Notable for the following components:   Acetaminophen (Tylenol), Serum 56 (*)    All other components within normal limits  RESP PANEL BY RT-PCR (FLU A&B, COVID) ARPGX2  PROTIME-INR  ETHANOL  PREGNANCY, URINE  I-STAT BETA HCG BLOOD, ED ([facility], [facility], [facility] ONLY)    EKG EKG Interpretation  Date/Time:  Saturday May 28 2020 18:05:28 EDT Ventricular Rate:  102 PR Interval:  146 QRS  Duration: 84 QT Interval:  356 QTC Calculation: 464 R Axis:   62 Text Interpretation: Sinus tachycardia Borderline T abnormalities, anterior leads Since last tracing QT has shortened Otherwise no significant change Confirmed by Calvert Cantor [phone]) on 05/28/2020 6:11:55 PM   Radiology No results found.  Procedures Procedures  Medications Ordered in the ED Medications  sodium chloride 0.9 % bolus 1,000 mL (0 mLs Intravenous Stopped 05/28/20 1940)  charcoal activated (NO SORBITOL) (ACTIDOSE-AQUA) suspension 75 g (75 g Oral Given 05/28/20 1940)  ondansetron (ZOFRAN) injection 4 mg (4 mg Intravenous Given 05/28/20 1819)     MDM Rules/Calculators/A&P MDM Spoke with poison control who recommends a dose of activated charcoal now (75g) and waiting for a 4 hour APAP level before beginning NAC (to be drawn at 2000 based on her reported ingestion time). She is otherwise non toxic appearing. The remainder of her overdose lab orders will be drawn now. EKG pending. ED Course  I have reviewed the triage vital signs and the nursing notes.  Pertinent labs & imaging results that were available during my care of the patient were reviewed by me and considered in my medical decision making (see chart for details).  Clinical Course as of 05/28/20 2255  Sat May 28, 2020  1741 CBC with mild anemia, not significantly changed from baseline.  [CS]  1753 Coags and EtOH are normal.  [CS]  1801 CMP is normal.  [CS]  1812 EKG without prolonged QT. Zofran ordered for nausea, unfortunately she is allergic to NSAID and PPI; would prefer to avoid sedating medications while getting charcoal to avoid risk of aspiration. No good options for abdominal pain control.  [CS]  1859 Patient unwilling/unable to drink charcoal. She will consent to NG tube placement.  [CS]  1929 Patient has not made much progress with charcoal and no longer willing to try NG Tube. I explained again that further delays could have long  term  consequences and that if she is unable to drink the charcoal now an NG tube would be placed whether she was able to consent or not. She is now drinking the charcoal, RN at bedside to monitor progress.  [CS]  2023 UA is neg. UDS positive for benzos, consistent with Rx for Ambien. Preg is negative.  [CS]  2029 Spoke again with Poison Control, 4hr APAP is not in the toxic range and LFTs are normal. No further recommendations from their end. Patient is medically clear for TTS.  [CS]    Clinical Course User Index [CS] Truddie Hidden, MD    Final Clinical Impression(s) / ED Diagnoses Final diagnoses:  Suicidal ideation  Intentional acetaminophen overdose, initial encounter [facility])    Rx / DC Orders ED Discharge Orders    None       Truddie Hidden, MD 05/28/20 2255

## 2020-05-28 NOTE — ED Notes (Signed)
Charcoal administered orally. Did not require NG tube. Pt dressed out in hospital provided burgundy scrubs. Belongings gathered from pt and placed in secure area. Pt attempting to provide urine specimen.
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
## 2020-05-29 DIAGNOSIS — F329 Major depressive disorder, single episode, unspecified: Secondary | ICD-10-CM | POA: Diagnosis present

## 2020-05-29 DIAGNOSIS — T391X2A Poisoning by 4-Aminophenol derivatives, intentional self-harm, initial encounter: Secondary | ICD-10-CM

## 2020-05-29 MED ORDER — APIXABAN 5 MG PO TABS
5.0000 mg | ORAL_TABLET | Freq: Two times a day (BID) | ORAL | Status: DC
  Administered 2020-05-29 (×3): 5 mg via ORAL
  Filled 2020-05-29 (×3): qty 1

## 2020-05-29 MED ORDER — HYDROXYCHLOROQUINE SULFATE 200 MG PO TABS
400.0000 mg | ORAL_TABLET | Freq: Every day | ORAL | Status: DC
Start: 2020-05-29 — End: 2020-05-30
  Administered 2020-05-29 (×2): 400 mg via ORAL
  Filled 2020-05-29 (×2): qty 2

## 2020-05-29 MED ORDER — HYDROXYZINE HCL 25 MG PO TABS: 25.0000 mg | ORAL_TABLET | Freq: Three times a day (TID) | ORAL | Status: DC | PRN

## 2020-05-29 MED ORDER — ALBUTEROL SULFATE (2.5 MG/3ML) 0.083% IN NEBU: 2.5000 mg | INHALATION_SOLUTION | Freq: Four times a day (QID) | RESPIRATORY_TRACT | Status: DC | PRN

## 2020-05-29 MED ORDER — REVEFENACIN 175 MCG/3ML IN SOLN
175.0000 ug | Freq: Every day | RESPIRATORY_TRACT | Status: DC
  Filled 2020-05-29 (×2): qty 3

## 2020-05-29 MED ORDER — NORETHIN-ETH ESTRAD-FE BIPHAS 1 MG-10 MCG / 10 MCG PO TABS: 1.0000 | ORAL_TABLET | Freq: Every day | ORAL | Status: DC

## 2020-05-29 MED ORDER — TIOTROPIUM BROMIDE MONOHYDRATE 18 MCG IN CAPS: 18.0000 ug | ORAL_CAPSULE | Freq: Every day | RESPIRATORY_TRACT | Status: DC

## 2020-05-29 MED ORDER — ZOLPIDEM TARTRATE 5 MG PO TABS
10.0000 mg | ORAL_TABLET | Freq: Every evening | ORAL | Status: DC | PRN
  Administered 2020-05-29 (×2): 10 mg via ORAL
  Filled 2020-05-29 (×2): qty 2

## 2020-05-29 MED ORDER — ARFORMOTEROL TARTRATE 15 MCG/2ML IN NEBU
15.0000 ug | INHALATION_SOLUTION | Freq: Two times a day (BID) | RESPIRATORY_TRACT | Status: DC
  Filled 2020-05-29 (×3): qty 2

## 2020-05-29 MED ORDER — HALOPERIDOL LACTATE 5 MG/ML IJ SOLN
5.0000 mg | Freq: Once | INTRAMUSCULAR | Status: AC
  Administered 2020-05-29: 5 mg via INTRAMUSCULAR
  Filled 2020-05-29: qty 1

## 2020-05-29 MED ORDER — UMECLIDINIUM BROMIDE 62.5 MCG/INH IN AEPB
1.0000 | INHALATION_SPRAY | Freq: Every day | RESPIRATORY_TRACT | Status: DC
  Filled 2020-05-29: qty 7

## 2020-05-29 MED ORDER — BUDESONIDE 0.5 MG/2ML IN SUSP
0.5000 mg | Freq: Two times a day (BID) | RESPIRATORY_TRACT | Status: DC
  Filled 2020-05-29 (×4): qty 2

## 2020-05-29 MED ORDER — SERTRALINE HCL 50 MG PO TABS
200.0000 mg | ORAL_TABLET | Freq: Every day | ORAL | Status: DC
  Administered 2020-05-29 (×2): 200 mg via ORAL
  Filled 2020-05-29 (×2): qty 4

## 2020-05-29 NOTE — Progress Notes (Signed)
Pt refused all scheduled Respiratory medications.
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
## 2020-05-29 NOTE — ED Notes (Signed)
Safe transport called- pt has a room ready at [facility] by accepting physician Dr.Clary.  Dispatch stated they have one transport arranged before transporting this one.

## 2020-05-29 NOTE — ED Provider Notes (Signed)
Emergency Medicine Observation Re-evaluation Note  [name] is a 25 y.o. female, seen on rounds today.  Pt initially presented to the ED for complaints of Suicidal Currently, the patient is Waiting for psychiatric reevaluation.  Physical Exam  BP 116/72 (BP Location: Left Arm)   Pulse 83   Temp 98.1 F (36.7 C) (Oral)   Resp 16   Ht 1.575 m (5' 2" )   Wt 99.8 kg   LMP 04/29/2020 (Exact Date)   SpO2 95%   BMI 40.24 kg/m  Physical Exam General: No acute distress Cardiac: Regular rate Lungs: Breathing easily Psych: Resting exam  ED Course / MDM  EKG:EKG Interpretation  Date/Time:  Saturday May 28 2020 18:05:28 EDT Ventricular Rate:  102 PR Interval:  146 QRS Duration: 84 QT Interval:  356 QTC Calculation: 464 R Axis:   62 Text Interpretation: Sinus tachycardia Borderline T abnormalities, anterior leads Since last tracing QT has shortened Otherwise no significant change Confirmed by Calvert Cantor [id number]) on 05/28/2020 6:11:55 PM   I have reviewed the labs performed to date as well as medications administered while in observation.  Recent changes in the last 24 hours include initial medical evaluation.  Patient has been medically cleared.  Initial psychiatric assessment plan is for reassessment.  Plan  Current plan is for waiting for psychiatric disposition.    Dorie Rank, MD 05/29/20 [phone]

## 2020-05-29 NOTE — BH Assessment (Signed)
Lavell Luster, [facility] at [facility], says Pt is accepted to the service of Dr. Mallie Darting, room 302-1. Bed is available now. Number for RN report is [phone]. Notified Dr Octaviano Glow and Elbert Ewings, RN.   Evelena Peat, [facility], [facility] Triage Specialist [phone]

## 2020-05-30 ENCOUNTER — Encounter (HOSPITAL_COMMUNITY): Payer: Self-pay | Admitting: Psychiatry

## 2020-05-30 ENCOUNTER — Inpatient Hospital Stay (HOSPITAL_COMMUNITY): Payer: Federal, State, Local not specified - Other

## 2020-05-30 ENCOUNTER — Inpatient Hospital Stay (HOSPITAL_COMMUNITY)
Admission: AD | Admit: 2020-05-30 | Discharge: 2020-06-01 | DRG: 885 | Disposition: A | Discharge status: Home / Self Care | Payer: Federal, State, Local not specified - Other | Source: Intra-hospital | Attending: Psychiatry | Admitting: Psychiatry

## 2020-05-30 ENCOUNTER — Other Ambulatory Visit: Payer: Self-pay

## 2020-05-30 ENCOUNTER — Inpatient Hospital Stay (HOSPITAL_COMMUNITY): Payer: PPO

## 2020-05-30 DIAGNOSIS — Z91011 Allergy to milk products: Secondary | ICD-10-CM | POA: Diagnosis not present

## 2020-05-30 DIAGNOSIS — G4700 Insomnia, unspecified: Secondary | ICD-10-CM | POA: Diagnosis present

## 2020-05-30 DIAGNOSIS — K900 Celiac disease: Secondary | ICD-10-CM | POA: Diagnosis present

## 2020-05-30 DIAGNOSIS — Z20822 Contact with and (suspected) exposure to covid-19: Secondary | ICD-10-CM | POA: Diagnosis present

## 2020-05-30 DIAGNOSIS — Z809 Family history of malignant neoplasm, unspecified: Secondary | ICD-10-CM

## 2020-05-30 DIAGNOSIS — D494 Neoplasm of unspecified behavior of bladder: Secondary | ICD-10-CM | POA: Diagnosis present

## 2020-05-30 DIAGNOSIS — Z881 Allergy status to other antibiotic agents status: Secondary | ICD-10-CM

## 2020-05-30 DIAGNOSIS — Z7951 Long term (current) use of inhaled steroids: Secondary | ICD-10-CM | POA: Diagnosis not present

## 2020-05-30 DIAGNOSIS — Z91018 Allergy to other foods: Secondary | ICD-10-CM

## 2020-05-30 DIAGNOSIS — M329 Systemic lupus erythematosus, unspecified: Secondary | ICD-10-CM | POA: Diagnosis present

## 2020-05-30 DIAGNOSIS — Z9151 Personal history of suicidal behavior: Secondary | ICD-10-CM | POA: Diagnosis not present

## 2020-05-30 DIAGNOSIS — R1084 Generalized abdominal pain: Secondary | ICD-10-CM | POA: Diagnosis present

## 2020-05-30 DIAGNOSIS — Z8261 Family history of arthritis: Secondary | ICD-10-CM

## 2020-05-30 DIAGNOSIS — Z8249 Family history of ischemic heart disease and other diseases of the circulatory system: Secondary | ICD-10-CM | POA: Diagnosis not present

## 2020-05-30 DIAGNOSIS — R633 Feeding difficulties, unspecified: Secondary | ICD-10-CM | POA: Diagnosis present

## 2020-05-30 DIAGNOSIS — J449 Chronic obstructive pulmonary disease, unspecified: Secondary | ICD-10-CM | POA: Diagnosis present

## 2020-05-30 DIAGNOSIS — F332 Major depressive disorder, recurrent severe without psychotic features: Principal | ICD-10-CM | POA: Diagnosis present

## 2020-05-30 DIAGNOSIS — Z87820 Personal history of traumatic brain injury: Secondary | ICD-10-CM

## 2020-05-30 DIAGNOSIS — F4310 Post-traumatic stress disorder, unspecified: Secondary | ICD-10-CM | POA: Diagnosis present

## 2020-05-30 DIAGNOSIS — Z833 Family history of diabetes mellitus: Secondary | ICD-10-CM

## 2020-05-30 DIAGNOSIS — Z91010 Allergy to peanuts: Secondary | ICD-10-CM | POA: Diagnosis not present

## 2020-05-30 DIAGNOSIS — Z9049 Acquired absence of other specified parts of digestive tract: Secondary | ICD-10-CM | POA: Diagnosis not present

## 2020-05-30 DIAGNOSIS — K5190 Ulcerative colitis, unspecified, without complications: Secondary | ICD-10-CM | POA: Diagnosis present

## 2020-05-30 DIAGNOSIS — F410 Panic disorder [episodic paroxysmal anxiety] without agoraphobia: Secondary | ICD-10-CM | POA: Diagnosis present

## 2020-05-30 DIAGNOSIS — Z79899 Other long term (current) drug therapy: Secondary | ICD-10-CM | POA: Diagnosis not present

## 2020-05-30 DIAGNOSIS — R109 Unspecified abdominal pain: Secondary | ICD-10-CM | POA: Diagnosis present

## 2020-05-30 DIAGNOSIS — Z83438 Family history of other disorder of lipoprotein metabolism and other lipidemia: Secondary | ICD-10-CM

## 2020-05-30 DIAGNOSIS — Z880 Allergy status to penicillin: Secondary | ICD-10-CM

## 2020-05-30 DIAGNOSIS — Z7901 Long term (current) use of anticoagulants: Secondary | ICD-10-CM | POA: Diagnosis not present

## 2020-05-30 DIAGNOSIS — N3000 Acute cystitis without hematuria: Secondary | ICD-10-CM | POA: Diagnosis not present

## 2020-05-30 DIAGNOSIS — R112 Nausea with vomiting, unspecified: Secondary | ICD-10-CM

## 2020-05-30 DIAGNOSIS — Z91013 Allergy to seafood: Secondary | ICD-10-CM

## 2020-05-30 DIAGNOSIS — Z86711 Personal history of pulmonary embolism: Secondary | ICD-10-CM

## 2020-05-30 DIAGNOSIS — J45909 Unspecified asthma, uncomplicated: Secondary | ICD-10-CM | POA: Diagnosis not present

## 2020-05-30 DIAGNOSIS — Z888 Allergy status to other drugs, medicaments and biological substances status: Secondary | ICD-10-CM

## 2020-05-30 HISTORY — DX: Panic disorder (episodic paroxysmal anxiety): F41.0

## 2020-05-30 LAB — COMPREHENSIVE METABOLIC PANEL
ALT: 15 U/L (ref 0–44)
AST: 16 U/L (ref 15–41)
Albumin: 4.3 g/dL (ref 3.5–5.0)
Alkaline Phosphatase: 73 U/L (ref 38–126)
Anion gap: 8 (ref 5–15)
BUN: 6 mg/dL (ref 6–20)
CO2: 22 mmol/L (ref 22–32)
Calcium: 9.5 mg/dL (ref 8.9–10.3)
Chloride: 108 mmol/L (ref 98–111)
Creatinine, Ser: 0.7 mg/dL (ref 0.44–1.00)
GFR, Estimated: >60 mL/min (ref >60)
Glucose, Bld: 98 mg/dL (ref 70–99)
Potassium: 3.8 mmol/L (ref 3.5–5.1)
Sodium: 138 mmol/L (ref 135–145)
Total Bilirubin: 0.5 mg/dL (ref 0.3–1.2)
Total Protein: 7.9 g/dL (ref 6.5–8.1)

## 2020-05-30 LAB — LIPID PANEL
Cholesterol: 146 mg/dL (ref 0–200)
HDL: 45 mg/dL (ref >40)
LDL Cholesterol: 73 mg/dL (ref 0–99)
Total CHOL/HDL Ratio: 3.2 RATIO
Triglycerides: 141 mg/dL (ref <150)
VLDL: 28 mg/dL (ref 0–40)

## 2020-05-30 LAB — CBC WITH DIFFERENTIAL/PLATELET
Abs Immature Granulocytes: 0.03 10*3/uL (ref 0.00–0.07)
Abs Immature Granulocytes: 0.06 10*3/uL (ref 0.00–0.07)
Basophils Absolute: 0 10*3/uL (ref 0.0–0.1)
Basophils Absolute: 0 10*3/uL (ref 0.0–0.1)
Basophils Relative: 0 %
Basophils Relative: 0 %
Eosinophils Absolute: 0.1 10*3/uL (ref 0.0–0.5)
Eosinophils Absolute: 0 10*3/uL (ref 0.0–0.5)
Eosinophils Relative: 1 %
Eosinophils Relative: 0 %
HCT: 35.6 % — ABNORMAL LOW (ref 36.0–46.0)
HCT: 34.1 % — ABNORMAL LOW (ref 36.0–46.0)
Hemoglobin: 11.2 g/dL — ABNORMAL LOW (ref 12.0–15.0)
Hemoglobin: 10.9 g/dL — ABNORMAL LOW (ref 12.0–15.0)
Immature Granulocytes: 0 %
Immature Granulocytes: 0 %
Lymphocytes Relative: 25 %
Lymphocytes Relative: 7 %
Lymphs Abs: 2.9 10*3/uL (ref 0.7–4.0)
Lymphs Abs: 1 10*3/uL (ref 0.7–4.0)
MCH: 22 pg — ABNORMAL LOW (ref 26.0–34.0)
MCH: 22.4 pg — ABNORMAL LOW (ref 26.0–34.0)
MCHC: 31.5 g/dL (ref 30.0–36.0)
MCHC: 32 g/dL (ref 30.0–36.0)
MCV: 70.1 fL — ABNORMAL LOW (ref 80.0–100.0)
MCV: 70.2 fL — ABNORMAL LOW (ref 80.0–100.0)
Monocytes Absolute: 0.6 10*3/uL (ref 0.1–1.0)
Monocytes Absolute: 0.1 10*3/uL (ref 0.1–1.0)
Monocytes Relative: 5 %
Monocytes Relative: 1 %
Neutro Abs: 8 10*3/uL — ABNORMAL HIGH (ref 1.7–7.7)
Neutro Abs: 13 10*3/uL — ABNORMAL HIGH (ref 1.7–7.7)
Neutrophils Relative %: 69 %
Neutrophils Relative %: 92 %
Platelets: 656 10*3/uL — ABNORMAL HIGH (ref 150–400)
Platelets: 625 10*3/uL — ABNORMAL HIGH (ref 150–400)
RBC: 5.08 MIL/uL (ref 3.87–5.11)
RBC: 4.86 MIL/uL (ref 3.87–5.11)
RDW: 17.3 % — ABNORMAL HIGH (ref 11.5–15.5)
RDW: 17.2 % — ABNORMAL HIGH (ref 11.5–15.5)
WBC: 11.7 10*3/uL — ABNORMAL HIGH (ref 4.0–10.5)
WBC: 14.2 10*3/uL — ABNORMAL HIGH (ref 4.0–10.5)
nRBC: 0 % (ref 0.0–0.2)
nRBC: 0 % (ref 0.0–0.2)

## 2020-05-30 LAB — ACETAMINOPHEN LEVEL: Acetaminophen (Tylenol), Serum: <10 ug/mL — ABNORMAL LOW (ref 10–30)

## 2020-05-30 LAB — HEMOGLOBIN A1C
Hgb A1c MFr Bld: 6.3 % — ABNORMAL HIGH (ref 4.8–5.6)
Mean Plasma Glucose: 134.11 mg/dL

## 2020-05-30 LAB — POC OCCULT BLOOD, ED: Fecal Occult Bld: NEGATIVE

## 2020-05-30 LAB — LIPASE, BLOOD: Lipase: 48 U/L (ref 11–51)

## 2020-05-30 LAB — I-STAT BETA HCG BLOOD, ED (MC, WL, AP ONLY): I-stat hCG, quantitative: <5 m[IU]/mL (ref <5)

## 2020-05-30 LAB — TSH: TSH: 1.131 u[IU]/mL (ref 0.350–4.500)

## 2020-05-30 MED ORDER — LORAZEPAM 1 MG PO TABS
1.0000 mg | ORAL_TABLET | Freq: Four times a day (QID) | ORAL | Status: DC | PRN
  Administered 2020-05-31: 1 mg via ORAL
  Filled 2020-05-30: qty 1

## 2020-05-30 MED ORDER — ONDANSETRON HCL 4 MG/2ML IJ SOLN
4.0000 mg | Freq: Once | INTRAMUSCULAR | Status: AC
  Administered 2020-05-30: 4 mg via INTRAVENOUS
  Filled 2020-05-30: qty 2

## 2020-05-30 MED ORDER — ALUM & MAG HYDROXIDE-SIMETH 200-200-20 MG/5ML PO SUSP
30.0000 mL | Freq: Once | ORAL | Status: AC
  Administered 2020-05-30: 30 mL via ORAL
  Filled 2020-05-30: qty 30

## 2020-05-30 MED ORDER — LAMOTRIGINE 25 MG PO TABS
25.0000 mg | ORAL_TABLET | Freq: Every day | ORAL | Status: DC
  Administered 2020-05-30 – 2020-06-01 (×3): 25 mg via ORAL
  Filled 2020-05-30 (×5): qty 1

## 2020-05-30 MED ORDER — APIXABAN 5 MG PO TABS
5.0000 mg | ORAL_TABLET | Freq: Two times a day (BID) | ORAL | Status: DC
  Administered 2020-05-30 – 2020-06-01 (×4): 5 mg via ORAL
  Filled 2020-05-30 (×9): qty 1

## 2020-05-30 MED ORDER — DICYCLOMINE HCL 10 MG PO CAPS
10.0000 mg | ORAL_CAPSULE | Freq: Once | ORAL | Status: AC
  Administered 2020-05-30: 10 mg via ORAL
  Filled 2020-05-30: qty 1

## 2020-05-30 MED ORDER — UMECLIDINIUM BROMIDE 62.5 MCG/INH IN AEPB
1.0000 | INHALATION_SPRAY | Freq: Every day | RESPIRATORY_TRACT | Status: DC
  Filled 2020-05-30: qty 7

## 2020-05-30 MED ORDER — HYDROCORTISONE NA SUCCINATE PF 250 MG IJ SOLR
200.0000 mg | Freq: Once | INTRAMUSCULAR | Status: AC
  Administered 2020-05-30: 200 mg via INTRAVENOUS
  Filled 2020-05-30: qty 200

## 2020-05-30 MED ORDER — ONDANSETRON HCL 4 MG/2ML IJ SOLN: 4.0000 mg | Freq: Once | INTRAMUSCULAR | Status: DC

## 2020-05-30 MED ORDER — ADULT MULTIVITAMIN W/MINERALS CH
1.0000 | ORAL_TABLET | Freq: Every day | ORAL | Status: DC
  Administered 2020-05-30 – 2020-06-01 (×2): 1 via ORAL
  Filled 2020-05-30 (×6): qty 1

## 2020-05-30 MED ORDER — PROMETHAZINE HCL 25 MG PO TABS
12.5000 mg | ORAL_TABLET | Freq: Once | ORAL | Status: AC
  Administered 2020-05-30: 12.5 mg via ORAL
  Filled 2020-05-30: qty 1

## 2020-05-30 MED ORDER — BUDESONIDE 0.5 MG/2ML IN SUSP
0.5000 mg | Freq: Two times a day (BID) | RESPIRATORY_TRACT | Status: DC
  Filled 2020-05-30 (×9): qty 2

## 2020-05-30 MED ORDER — FLUTICASONE PROPIONATE 50 MCG/ACT NA SUSP
1.0000 | Freq: Every day | NASAL | Status: DC | PRN
  Filled 2020-05-30: qty 16

## 2020-05-30 MED ORDER — PANTOPRAZOLE SODIUM 40 MG IV SOLR
40.0000 mg | Freq: Once | INTRAVENOUS | Status: AC
  Administered 2020-05-30: 40 mg via INTRAVENOUS
  Filled 2020-05-30: qty 40

## 2020-05-30 MED ORDER — ZOLPIDEM TARTRATE 5 MG PO TABS
5.0000 mg | ORAL_TABLET | Freq: Every evening | ORAL | Status: DC | PRN
  Administered 2020-05-30 – 2020-05-31 (×2): 5 mg via ORAL
  Filled 2020-05-30 (×2): qty 1

## 2020-05-30 MED ORDER — NORETHIN-ETH ESTRAD-FE BIPHAS 1 MG-10 MCG / 10 MCG PO TABS
1.0000 | ORAL_TABLET | Freq: Every day | ORAL | Status: DC
  Administered 2020-05-31 (×2): 1 via ORAL

## 2020-05-30 MED ORDER — DIPHENHYDRAMINE HCL 50 MG/ML IJ SOLN
50.0000 mg | Freq: Once | INTRAMUSCULAR | Status: AC
  Administered 2020-05-30: 50 mg via INTRAVENOUS
  Filled 2020-05-30: qty 1

## 2020-05-30 MED ORDER — ALUM & MAG HYDROXIDE-SIMETH 200-200-20 MG/5ML PO SUSP: 30.0000 mL | ORAL | Status: DC | PRN

## 2020-05-30 MED ORDER — CLONAZEPAM 0.125 MG PO TBDP
0.2500 mg | ORAL_TABLET | Freq: Once | ORAL | Status: AC
  Administered 2020-05-30: 0.25 mg via ORAL
  Filled 2020-05-30: qty 2

## 2020-05-30 MED ORDER — LIDOCAINE VISCOUS HCL 2 % MT SOLN
15.0000 mL | Freq: Once | OROMUCOSAL | Status: AC
  Administered 2020-05-30: 15 mL via ORAL
  Filled 2020-05-30: qty 15

## 2020-05-30 MED ORDER — REVEFENACIN 175 MCG/3ML IN SOLN
175.0000 ug | Freq: Every day | RESPIRATORY_TRACT | Status: DC
  Filled 2020-05-30 (×5): qty 3

## 2020-05-30 MED ORDER — FENTANYL CITRATE (PF) 100 MCG/2ML IJ SOLN
50.0000 ug | Freq: Once | INTRAMUSCULAR | Status: AC
  Administered 2020-05-30: 50 ug via INTRAVENOUS
  Filled 2020-05-30: qty 2

## 2020-05-30 MED ORDER — DIPHENHYDRAMINE HCL 25 MG PO CAPS: 50.0000 mg | ORAL_CAPSULE | Freq: Once | ORAL | Status: AC

## 2020-05-30 MED ORDER — MAGNESIUM HYDROXIDE 400 MG/5ML PO SUSP: 30.0000 mL | Freq: Every day | ORAL | Status: DC | PRN

## 2020-05-30 MED ORDER — CLONAZEPAM 0.5 MG PO TABS: 0.2500 mg | ORAL_TABLET | Freq: Once | ORAL | Status: DC

## 2020-05-30 MED ORDER — PROMETHAZINE HCL 25 MG PO TABS
25.0000 mg | ORAL_TABLET | Freq: Four times a day (QID) | ORAL | Status: DC | PRN
  Administered 2020-05-30 – 2020-06-01 (×3): 25 mg via ORAL
  Filled 2020-05-30 (×3): qty 1

## 2020-05-30 MED ORDER — SERTRALINE HCL 100 MG PO TABS
200.0000 mg | ORAL_TABLET | Freq: Every day | ORAL | Status: DC
  Administered 2020-05-30 – 2020-05-31 (×2): 200 mg via ORAL
  Filled 2020-05-30: qty 4
  Filled 2020-05-30 (×4): qty 2

## 2020-05-30 MED ORDER — ARFORMOTEROL TARTRATE 15 MCG/2ML IN NEBU
15.0000 ug | INHALATION_SOLUTION | Freq: Two times a day (BID) | RESPIRATORY_TRACT | Status: DC
  Filled 2020-05-30 (×9): qty 2

## 2020-05-30 MED ORDER — SODIUM CHLORIDE 0.9 % IV BOLUS
1000.0000 mL | Freq: Once | INTRAVENOUS | Status: AC
Start: 2020-05-30 — End: 2020-05-30
  Administered 2020-05-30: 1000 mL via INTRAVENOUS

## 2020-05-30 MED ORDER — HYDROXYCHLOROQUINE SULFATE 200 MG PO TABS
400.0000 mg | ORAL_TABLET | Freq: Every day | ORAL | Status: DC
Start: 2020-05-30 — End: 2020-06-01
  Administered 2020-05-30 – 2020-05-31 (×2): 400 mg via ORAL
  Filled 2020-05-30 (×5): qty 2

## 2020-05-30 MED ORDER — THIAMINE HCL 100 MG PO TABS
100.0000 mg | ORAL_TABLET | Freq: Every day | ORAL | Status: DC
  Administered 2020-06-01: 100 mg via ORAL
  Filled 2020-05-30 (×4): qty 1

## 2020-05-30 MED ORDER — HYDROXYZINE HCL 25 MG PO TABS: 25.0000 mg | ORAL_TABLET | Freq: Three times a day (TID) | ORAL | Status: DC | PRN

## 2020-05-30 MED ORDER — EPINEPHRINE 0.3 MG/0.3ML IJ SOAJ
0.3000 mg | Freq: Every day | INTRAMUSCULAR | Status: DC | PRN
  Filled 2020-05-30: qty 0.3

## 2020-05-30 MED ORDER — ALBUTEROL SULFATE (2.5 MG/3ML) 0.083% IN NEBU: 2.5000 mg | INHALATION_SOLUTION | Freq: Four times a day (QID) | RESPIRATORY_TRACT | Status: DC | PRN

## 2020-05-30 MED ORDER — DIPHENHYDRAMINE HCL 50 MG/ML IJ SOLN
25.0000 mg | Freq: Once | INTRAMUSCULAR | Status: AC
  Administered 2020-05-30: 25 mg via INTRAVENOUS
  Filled 2020-05-30: qty 1

## 2020-05-30 MED ORDER — LOPERAMIDE HCL 2 MG PO CAPS
2.0000 mg | ORAL_CAPSULE | ORAL | Status: DC | PRN
Start: 2020-05-30 — End: 2020-06-01

## 2020-05-30 MED ORDER — MORPHINE SULFATE (PF) 4 MG/ML IV SOLN
4.0000 mg | Freq: Once | INTRAVENOUS | Status: AC
Start: 2020-05-30 — End: 2020-05-30
  Administered 2020-05-30: 4 mg via INTRAVENOUS
  Filled 2020-05-30: qty 1

## 2020-05-30 MED ORDER — FENTANYL CITRATE (PF) 100 MCG/2ML IJ SOLN: 50.0000 ug | Freq: Once | INTRAMUSCULAR | Status: DC

## 2020-05-30 MED ORDER — IOHEXOL 300 MG/ML  SOLN
100.0000 mL | Freq: Once | INTRAMUSCULAR | Status: AC | PRN
  Administered 2020-05-30: 100 mL via INTRAVENOUS

## 2020-05-30 MED ORDER — ONDANSETRON HCL 4 MG PO TABS
4.0000 mg | ORAL_TABLET | Freq: Once | ORAL | Status: AC
  Administered 2020-05-30: 4 mg via ORAL
  Filled 2020-05-30 (×2): qty 1

## 2020-05-30 MED ORDER — HYDROXYZINE HCL 25 MG PO TABS
25.0000 mg | ORAL_TABLET | Freq: Four times a day (QID) | ORAL | Status: DC | PRN
  Administered 2020-05-31 – 2020-06-01 (×3): 25 mg via ORAL
  Filled 2020-05-30 (×2): qty 1

## 2020-05-30 NOTE — Tx Team (Signed)
Interdisciplinary Treatment and Diagnostic Plan Update  05/30/2020 Time of Session: 9:05am  [name] MRN: [id number]  Principal Diagnosis: <principal problem not specified>  Secondary Diagnoses: Active Problems:   MDD (major depressive disorder), recurrent severe, without psychosis ([place])   Panic disorder   Current Medications:  Current Facility-Administered Medications  Medication Dose Route Frequency Provider Last Rate Last Admin  . albuterol (PROVENTIL) (2.5 MG/3ML) 0.083% nebulizer solution 2.5 mg  2.5 mg Nebulization Q6H PRN Prescilla Sours, PA-C      . alum & mag hydroxide-simeth (MAALOX/MYLANTA) 200-200-20 MG/5ML suspension 30 mL  30 mL Oral Q4H PRN Margorie John W, PA-C      . apixaban Arne [place]) tablet 5 mg  5 mg Oral BID Margorie John W, PA-C   5 mg at 05/30/20 2694  . arformoterol (BROVANA) nebulizer solution 15 mcg  15 mcg Nebulization BID Lovena Le, Cody W, PA-C      . budesonide (PULMICORT) nebulizer solution 0.5 mg  0.5 mg Nebulization BID Lovena Le, Cody W, PA-C      . clonazePAM Bobbye [place]) tablet 0.25 mg  0.25 mg Oral Once Arthor Captain, MD      . EPINEPHrine (EPI-PEN) injection 0.3 mg  0.3 mg Intramuscular Daily PRN Lovena Le, Cody W, PA-C      . fluticasone [facility]) 50 MCG/ACT nasal spray 1 spray  1 spray Each Nare Daily PRN Prescilla Sours, PA-C      . hydroxychloroquine (PLAQUENIL) tablet 400 mg  400 mg Oral QHS Margorie John W, PA-C      . hydrOXYzine (ATARAX/VISTARIL) tablet 25 mg  25 mg Oral TID PRN Prescilla Sours, PA-C      . lamoTRIgine (LAMICTAL) tablet 25 mg  25 mg Oral Daily Arthor Captain, MD      . magnesium hydroxide (MILK OF MAGNESIA) suspension 30 mL  30 mL Oral Daily PRN Prescilla Sours, PA-C      . Norethindrone-Ethinyl Estradiol-Fe Biphas (LO LOESTRIN FE) 1 MG-10 MCG / 10 MCG tablet 1 tablet  1 tablet Oral Daily Lovena Le, Cody W, PA-C      . promethazine (PHENERGAN) tablet 25 mg  25 mg Oral Q6H PRN Sharma Covert, MD      . revefenacin [facility])  nebulizer solution 175 mcg  175 mcg Nebulization Daily Margorie John W, PA-C      . sertraline (ZOLOFT) tablet 200 mg  200 mg Oral QHS Lovena Le, Cody W, PA-C      . umeclidinium bromide (INCRUSE ELLIPTA) 62.5 MCG/INH 1 puff  1 puff Inhalation Daily Margorie John W, PA-C      . zolpidem (AMBIEN) tablet 5 mg  5 mg Oral QHS PRN Prescilla Sours, PA-C       PTA Medications: Medications Prior to Admission  Medication Sig Dispense Refill Last Dose  . albuterol (PROVENTIL) (2.5 MG/3ML) 0.083% nebulizer solution Take 3 mLs (2.5 mg total) by nebulization every 6 (six) hours as needed for wheezing or shortness of breath. 75 mL 12   . ALPRAZolam (XANAX) 0.5 MG tablet Take 0.25-0.5 mg by mouth daily as needed for anxiety.     . ALPRAZolam (XANAX) 1 MG tablet Take 1 mg by mouth 3 (three) times daily as needed for anxiety.     Marland Kitchen apixaban (ELIQUIS) 5 MG TABS tablet Take 5 mg by mouth 2 (two) times daily.     Marland Kitchen arformoterol (BROVANA) 15 MCG/2ML NEBU Take 2 mLs (15 mcg total) by nebulization 2 (two) times daily. 120 mL 1   .  azelastine (ASTELIN) 0.1 % nasal spray Place 1 spray into both nostrils daily as needed for allergies.     . budesonide (PULMICORT) 0.5 MG/2ML nebulizer solution Take 2 mLs (0.5 mg total) by nebulization 2 (two) times daily. 100 mL 3   . chlorpheniramine-HYDROcodone (TUSSIONEX PENNKINETIC ER) 10-8 MG/5ML SUER Take 5 mLs by mouth every 12 (twelve) hours as needed for cough. (Patient not taking: Reported on 05/28/2020) 100 mL 0   . diphenhydrAMINE (BENADRYL) 25 MG tablet Take 25 mg by mouth every 6 (six) hours as needed for allergies.     . fluticasone (FLONASE) 50 MCG/ACT nasal spray Place 1 spray into both nostrils daily as needed for allergies or rhinitis.     . hydroxychloroquine (PLAQUENIL) 200 MG tablet Take 2 tablets (400 mg total) by mouth daily. (Patient taking differently: Take 400 mg by mouth at bedtime.) 180 tablet 0   . montelukast (SINGULAIR) 10 MG tablet Take 1 tablet (10 mg total) by  mouth at bedtime. (Patient not taking: Reported on 05/28/2020) 30 tablet 0   . Norethindrone-Ethinyl Estradiol-Fe Biphas (LO LOESTRIN FE) 1 MG-10 MCG / 10 MCG tablet Take 1 tablet by mouth daily. 90 tablet 0   . pantoprazole (PROTONIX) 40 MG tablet Take 1 tablet (40 mg total) by mouth daily at 6 (six) AM. (Patient not taking: Reported on 05/28/2020) 30 tablet 2   . revefenacin (YUPELRI) 175 MCG/3ML nebulizer solution Take 3 mLs (175 mcg total) by nebulization daily. 90 mL 1   . sertraline (ZOLOFT) 100 MG tablet Take 200 mg by mouth at bedtime.     . sucralfate (CARAFATE) 1 GM/10ML suspension Take 10 mLs (1 g total) by mouth 2 (two) times daily. 420 mL 0   . tiotropium (SPIRIVA) 18 MCG inhalation capsule Place 18 mcg into inhaler and inhale daily.     . traMADol (ULTRAM) 50 MG tablet Take 1 tablet (50 mg total) by mouth every 6 (six) hours as needed. (Patient not taking: Reported on 05/28/2020) 15 tablet 0   . zolpidem (AMBIEN) 10 MG tablet Take 10 mg by mouth at bedtime as needed for sleep.       Patient Stressors: Educational concerns Health problems Traumatic event  Patient Strengths: Active sense of humor Average or above average intelligence Capable of independent living Child psychotherapist Supportive family/friends Work skills  Treatment Modalities: Medication Management, Group therapy, Case management,  1 to 1 session with clinician, Psychoeducation, Recreational therapy.   Physician Treatment Plan for Primary Diagnosis: <principal problem not specified> Long Term Goal(s):     Short Term Goals:    Medication Management: Evaluate patient's response, side effects, and tolerance of medication regimen.  Therapeutic Interventions: 1 to 1 sessions, Unit Group sessions and Medication administration.  Evaluation of Outcomes: Progressing  Physician Treatment Plan for Secondary Diagnosis: Active Problems:   MDD (major depressive disorder), recurrent severe, without  psychosis ([place])   Panic disorder  Long Term Goal(s):     Short Term Goals:       Medication Management: Evaluate patient's response, side effects, and tolerance of medication regimen.  Therapeutic Interventions: 1 to 1 sessions, Unit Group sessions and Medication administration.  Evaluation of Outcomes: Progressing   RN Treatment Plan for Primary Diagnosis: <principal problem not specified> Long Term Goal(s): Knowledge of disease and therapeutic regimen to maintain health will improve  Short Term Goals: Ability to remain free from injury will improve, Ability to participate in decision making will improve, Ability to verbalize feelings will  improve, Ability to disclose and discuss suicidal ideas and Ability to identify and develop effective coping behaviors will improve  Medication Management: RN will administer medications as ordered by provider, will assess and evaluate patient's response and provide education to patient for prescribed medication. RN will report any adverse and/or side effects to prescribing provider.  Therapeutic Interventions: 1 on 1 counseling sessions, Psychoeducation, Medication administration, Evaluate responses to treatment, Monitor vital signs and CBGs as ordered, Perform/monitor CIWA, COWS, AIMS and Fall Risk screenings as ordered, Perform wound care treatments as ordered.  Evaluation of Outcomes: Progressing   LCSW Treatment Plan for Primary Diagnosis: <principal problem not specified> Long Term Goal(s): Safe transition to appropriate next level of care at discharge, Engage patient in therapeutic group addressing interpersonal concerns.  Short Term Goals: Engage patient in aftercare planning with referrals and resources, Increase social support, Increase emotional regulation, Facilitate acceptance of mental health diagnosis and concerns, Identify triggers associated with mental health/substance abuse issues and Increase skills for wellness and  recovery  Therapeutic Interventions: Assess for all discharge needs, 1 to 1 time with Social worker, Explore available resources and support systems, Assess for adequacy in community support network, Educate family and significant other(s) on suicide prevention, Complete Psychosocial Assessment, Interpersonal group therapy.  Evaluation of Outcomes: Progressing   Progress in Treatment: Attending groups: Yes. Participating in groups: Yes. Taking medication as prescribed: Yes. Toleration medication: Yes. Family/Significant other contact made: No, will contact:  If consents are provided  Patient understands diagnosis: Yes. Discussing patient identified problems/goals with staff: Yes. Medical problems stabilized or resolved: Yes. Denies suicidal/homicidal ideation: Yes. Issues/concerns per patient self-inventory: No.   New problem(s) identified: No, Describe:  None   New Short Term/Long Term Goal(s): medication stabilization, elimination of SI thoughts, development of comprehensive mental wellness plan.   Patient Goals: "To go home"  Discharge Plan or Barriers: Patient recently admitted. CSW will continue to follow and assess for appropriate referrals and possible discharge planning.   Reason for Continuation of Hospitalization: Depression Medical Issues Medication stabilization Suicidal ideation  Estimated Length of Stay: 3 to 5 days   Attendees: Patient: [name]  05/30/2020   Physician: Myles Lipps, MD  05/30/2020   Nursing:  05/30/2020   RN Care Manager: 05/30/2020   Social Worker: Verdis Frederickson, [place]  05/30/2020   Recreational Therapist:  05/30/2020   Other:  05/30/2020   Other:  05/30/2020   Other: 05/30/2020     Scribe for Treatment Team: Darleen Crocker, [place] 05/30/2020 1:26 PM

## 2020-05-30 NOTE — Tx Team (Signed)
Initial Treatment Plan 05/30/2020 2:18 AM [name] QQI:[id number]    PATIENT STRESSORS: Educational concerns Health problems Traumatic event   PATIENT STRENGTHS: Active sense of humor Average or above average intelligence Capable of independent living Communication skills Financial means Supportive family/friends Work skills   PATIENT IDENTIFIED PROBLEMS: Depression  Anxiety  Suicidal Ideation      "Get my anxiety under control and on the right medications"           DISCHARGE CRITERIA:  Improved stabilization in mood, thinking, and/or behavior Motivation to continue treatment in a less acute level of care Need for constant or close observation no longer present Verbal commitment to aftercare and medication compliance  PRELIMINARY DISCHARGE PLAN: Outpatient therapy Return to previous living arrangement Return to previous work or school arrangements  PATIENT/FAMILY INVOLVEMENT: This treatment plan has been presented to and reviewed with the patient, [name].  The patient and family have been given the opportunity to ask questions and make suggestions.  Margaretann Loveless, RN 05/30/2020, 2:18 AM

## 2020-05-30 NOTE — Discharge Instructions (Addendum)
Return to the emergency department for any worsening abdominal pain, persistent vomiting, fevers or any other worsening or concerning symptoms.

## 2020-05-30 NOTE — Care Plan (Signed)
Notified by staff that patient was requesting to see MD again this afternoon.  Patient seen and interviewed.  Medical record reviewed.  On interview this afternoon, patient reports experiencing worsening abdominal pain and nausea and vomiting since I saw her this morning.  She reports that nausea and vomiting were not alleviated by PRN Phenergan.  She has not been eating and has been unable to keep PO fluids down.  Given recent Tylenol overdose and patient's multiple medical problems, we will transport to [facility] via EMS for further medical evaluation.  Patient has had one Tylenol level of 56 ug/mL on the evening 5/07 at [facility] prior to transfer here late on 05/29/2020.  Vital signs this morning include BP of 123/55, pulse of 124, O2 sat of 100% and temperature of 98.2.  I made direct contact with [facility] ED physician Dr. Maryan Rued this afternoon and discussed the transfer of patient to the ED for further medical work-up and any needed stabilizing treatment.  Dr. Maryan Rued agreed to the transfer.  Patient was made aware of the imminent transport to [facility] ED. With patient's permission I made direct phone contact with patient's father Charmagne Buhl ([phone]) this afternoon and advised him of the reasons for transfer and that patient would be transferred to [facility] ED for evaluation.  Once deemed medically stable she will be transferred back to [facility].  Patient's father expressed understanding of the information discussed.
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
## 2020-05-30 NOTE — [facility] Group Notes (Signed)
Type of Therapy and Topic: Group Therapy: Anger Management   Participation: Attended  Description of Group: In this group, patients will learn helpful strategies and techniques to manage anger, express anger in alternative ways, change hostile attitudes, and prevent aggressive acts, such as verbal abuse and violence.This group will be process-oriented and eductional, with patients participating in exploration of their own experiences as well as giving and receiving support and challenge from other group members.  Therapeutic Goals: 1. Patient will learn to manage anger. 2. Patient will learn to stop violence or the threat of violence. 3. Patient will learn to develop self control over thoughts and actions. 4. Patient will receive support and feedback from others  Therapeutic Modalities: Cognitive Behavioral Therapy Solution Focused Therapy Motivational Interviewing  Toney Reil, [place]

## 2020-05-30 NOTE — H&P (Signed)
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 Normocephalic and atraumatic.  Neurological:     General: No focal deficit present.     Mental Status: She is alert and oriented to person, place, and time.    Review of Systems  Constitutional: Negative for chills and fever.  HENT: Negative for hearing loss.   Eyes: Negative for blurred vision.  Respiratory: Negative for cough and shortness of breath.   Cardiovascular: Negative for chest pain.  Gastrointestinal: Positive for nausea and vomiting. Negative for constipation and diarrhea.  Genitourinary: Negative for dysuria.  Musculoskeletal: Negative.   Skin: Negative for rash.   Neurological: Negative for tremors and seizures.  Psychiatric/Behavioral: Positive for depression and suicidal ideas. Negative for hallucinations and substance abuse. The patient is nervous/anxious and has insomnia.    Blood pressure (!) 123/55, pulse (!) 124, temperature 98.2 F (36.8 C), temperature source Oral, resp. rate 18, height _0  (1.575 m), weight 109.5 kg, SpO2 100 %. Body mass index is 44.17 kg/m.  Treatment Plan Summary: Elanor Cale is a 25 year old female with multiple medical problems, panic disorder, self-reported history of PTSD and mood disorder (depression versus bipolar type II disorder) who was admitted for worsening anxiety, depression and suicidal ideation which culminated in an overdose in the context of multiple psychosocial stressors.  Patient has some history that is concerning for possible underlying bipolar diathesis although she does not give a history of past episodes that clearly meet clear criteria for past  manic or hypomanic episode.    Daily contact with patient to assess and evaluate symptoms and progress in treatment and Medication management  Observation Level/Precautions:  15 minute checks  Laboratory:  CBC Chemistry Profile HbAIC HCG UDS Lipid panel, TSH, acetaminophen level. Labs from the emergency department on 5 7 include CMP with glucose of 119 and otherwise WNL.  CBC with hemoglobin of 11.2, hematocrit of 35.6, MCV of 71.3, MCH of 22.4, RDW of 17.5 and platelets of 575.  Differential is normal.  Acetaminophen level on admission was 56 mcg/mL.  Urine pregnancy test was negative.  Urine drug screen was positive for benzodiazepines.  Will order lipid panel, hemoglobin A1c and TSH for evening draw.  Repeat acetaminophen level and CMP have also been ordered for evening draw.    Psychotherapy:  Encouraged participation in group therapy and therapeutic milieu  Medications: Continue sertraline 200 mg daily for panic disorder and depressive symptoms.  We  will start lamotrigine 25 mg daily for possible underlying bipolar diathesis.  Indications for, anticipated outcomes of treatment and potential risks and side effects including risk of Stevens-Johnson syndrome/death discussed with patient.  Patient provided informed consent.  5 mg at bedtime for sleep.  Have placed patient on CIWA protocol to cover for any symptoms of benzodiazepine withdrawal that may arise in the event that patient possibly is underreporting benzodiazepine use.  Have low suspicion that she is underreporting benzodiazepine use.  We will continue medications for lupus, history of pulmonary emboli, etc.  See MAR.  Consultations:    Discharge Concerns:    Estimated LOS: 3 to 5 days  Other:    Physician Treatment Plan for Primary Diagnosis: MDD (major depressive disorder), recurrent severe, without psychosis ([place]) Long Term Goal(s): Improvement in symptoms so as ready for discharge  Short Term Goals: Ability to identify changes in lifestyle to reduce recurrence of condition will improve, Ability to verbalize feelings will improve, Ability to disclose and discuss suicidal ideas, Ability to demonstrate self-control will improve, Ability to identify and develop effective coping behaviors will improve and Ability to identify triggers associated with substance abuse/mental health issues will improve  Physician Treatment Plan for Secondary Diagnosis: Principal Problem:   MDD (major depressive disorder), recurrent severe, without psychosis ([place]) Active Problems:   Panic disorder  Long Term Goal(s): Improvement in symptoms so as ready for discharge  Short Term Goals: Ability to identify changes in lifestyle to reduce recurrence of condition will improve, Ability to verbalize feelings will improve, Ability to disclose and discuss suicidal ideas, Ability to demonstrate self-control will improve, Ability to identify and develop effective coping behaviors will improve and Ability to identify  triggers associated with substance abuse/mental health issues will improve  I certify that inpatient services furnished can reasonably be expected to improve the patient's condition.    Arthor Captain, MD 5/9/20224:27 PM

## 2020-05-30 NOTE — Progress Notes (Signed)
Nutrition Brief Note  Patient identified on the Malnutrition Screening Tool (MST) Report  No weight loss per weight records.  Wt Readings from Last 15 Encounters:  05/30/20 109.5 kg  05/28/20 99.8 kg  05/05/20 99.8 kg  04/04/20 99.8 kg  03/23/20 116.9 kg  03/22/20 94.7 kg  03/01/20 95.3 kg  12/31/19 108.9 kg  11/14/19 108.9 kg  11/13/19 108.9 kg  04/11/19 90.7 kg  03/25/19 114.3 kg    Body mass index is 44.17 kg/m. Patient meets criteria for morbid obesity based on current BMI.   Labs and medications reviewed.   No nutrition interventions warranted at this time. If nutrition issues arise, please consult RD.    Clayton Bibles, MS, RD, LDN Inpatient Clinical Dietitian Contact information available via Amion

## 2020-05-30 NOTE — ED Triage Notes (Signed)
BIBA Per EMS:  Pt coming from [place] 10,000 MG tylenol on Sunday ; now complains of dark tarry stools, extreme abd pain  140/78 104 HR  18 RR 98% RA 120 CBG  97.9 temp

## 2020-05-30 NOTE — Progress Notes (Signed)
Recreation Therapy Notes  Date:  5.9.22 Time: 0930 Location: [address] Dayroom  Group Topic: Stress Management  Goal Area(s) Addresses:  Patient will identify positive stress management techniques. Patient will identify benefits of using stress management post d/c.  Behavioral Response: Engaged, Attentive  Intervention: Stress Management  Activity :  Meditation.  LRT played a meditation that focused on the importance of self esteem.  Patients were to listen and follow along as the meditation played to fully engage in meditation.    Education:  Stress Management, Discharge Planning.   Education Outcome: Acknowledges Education  Clinical Observations/Feedback: Pt attended group session.    Victorino Sparrow, LRT/CTRS         Ria Comment, Kentrel Clevenger A 05/30/2020 11:03 AM
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
## 2020-05-31 LAB — T4, FREE: Free T4: 0.77 ng/dL (ref 0.61–1.12)

## 2020-05-31 NOTE — Progress Notes (Signed)
Adult Psychoeducational Group Note  Date:  05/31/2020 Time:  10:43 AM  Group Topic/Focus:  Goals Group:   The focus of this group is to help patients establish daily goals to achieve during treatment and discuss how the patient can incorporate goal setting into their daily lives to aide in recovery.  Participation Level:  Active  Participation Quality:  Appropriate  Affect:  Appropriate  Cognitive:  Alert  Insight: Appropriate  Engagement in Group:  Engaged  Modes of Intervention:  Discussion  Additional Comments:  Pt attended group and participated in discussion.  Marqui Formby R Sirus Labrie 05/31/2020, 10:43 AM

## 2020-05-31 NOTE — Progress Notes (Addendum)
   05/31/20 0616  Vital Signs  Temp 98 F (36.7 C)  Temp Source Oral  Pulse Rate (!) 118  BP 133/86  BP Location Right Arm  BP Method Automatic  Patient Position (if appropriate) Sitting  CIWA-Ar  Nausea and Vomiting 1  Tactile Disturbances 0  Tremor 0  Auditory Disturbances 0  Paroxysmal Sweats 1  Visual Disturbances 0  Anxiety 1  Headache, Fullness in Head 0  Agitation 0  Orientation and Clouding of Sensorium 0  CIWA-Ar Total 3   D: Patient denies SI/HI/AVH. Patient rated depression 3/10 and denied depression. PT. Out in open areas, attended group and was social with peers. Pt  Complained of stomach pain and stated that she wanted to go to the ED and pt. Wanted to go to a GI doctor that her father made an appointment for her.  A:  Patient took Lamictal, but  Refused some  inhalers that she no longer needs..  Support and encouragement provided Routine safety checks conducted every 15 minutes. Patient  Informed to notify staff with any concerns.   R: Safety maintained.
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
## 2020-05-31 NOTE — Progress Notes (Signed)
[facility] MD Progress Note  05/31/2020 3:27 PM [name]  MRN:  [id number]   Reason for admission:  [name] a 25 year old female with prior psychiatric diagnoses of panic disorder,PTSD and depression as well as diagnoses of lupus and recent inpatient admissions for pulmonary emboli who was admitted following a suicide attempt by overdose on Tylenol (10,000 mg) in the context of worsening anxiety and depressive symptoms, recent medical stressors and recent academic stressors.   Objective: Record reviewed.  Patient's case discussed in detail with members of the treatment team.  The patient was sent to the ED yesterday afternoon for evaluation of worsening abdominal pain, nausea vomiting and tarry stools.  In the ED the patient was hydrated with IV fluids.  Repeat acetaminophen level was <10 and repeat CMP was WNL with normal hepatic function tests.  CT of the abdomen performed on 05/30/2020 revealed no acute abnormality in the abdomen/pelvis and moderate colonic stool burden consistent with possible constipation.  Patient was transferred back to [facility] last night and did not sleep well last night.  I met with and evaluated the patient today for follow-up.  Patient reports trouble sleeping due to ongoing stomach pain and noise of her roommate.  She denies depressed mood or anhedonia.  She reports a variety of things she is looking forward to doing after discharge.  The patient denies passive wish for death or suicidal ideation.  She states she has not had suicidal ideation since Saturday, 05/28/2020.  Patient states that she is is eager for discharge because she feels like being in the hospital is "a punishment."  She expresses regret for taking overdose and feels bad for any distress that has caused her parents.  She denies AI, HI, PI, AH or VH.  She denies any side effects from her medications including any rash.  She reports that she has had difficulty eating and drinking fluids due to stomach  discomfort.  She is eager for discharge and states she wants to be seen by gastroenterologist this afternoon or tomorrow.  We discussed the importance of her participating in outpatient therapy to target her anxiety.  We also discussed other nonpharmacologic strategies for anxiety management and the importance of engaging in these regularly addition to taking medications.  The patient is recorded as having slept 45 minutes only last night.  Other labs from 05/30/2020 and 05/31/2020 include CBC with WBC of 14.2, hemoglobin of 10.9, hematocrit of 34.1, MCV of 70.2, RDW of 17.2 and platelets of 625.  Differential revealed absolute neutrophil count of 13.0 and otherwise WNL.  Quantitative hCG was <5.  hemoglobin A1c was 6.3.  Lipid panel was WNL.  TSH was 1.131.  Free T4 was 7 7.  Patient has been attending groups and participating appropriately.  She has been taking her standing dose psychiatric medications as prescribed.  The patient took as needed hydroxyzine for anxiety at approximately 1 AM.  She took lorazepam as needed this morning at 4:30 AM.  Eitel signs this morning include BP of 119/92 sitting and 133/86 standing, pulse of 129 sitting and 118 standing.  Pulse was repeated 20 minutes later and was at 112.  Temperature was 98.  I spoke with the patient's father, [name], today by phone together with the team social worker for approximately 20 minutes this afternoon.  We discussed patient's current status, treatment and recommendations for aftercare.  Father was provided opportunity to ask questions.  Father's questions were answered.  Mr. Havlik expressed understanding of information discussed  and stated appreciation for the call.  Principal Problem: MDD (major depressive disorder), recurrent severe, without psychosis (HCC) Diagnosis: Principal Problem:   MDD (major depressive disorder), recurrent severe, without psychosis (HCC) Active Problems:   Panic disorder  Total Time spent with patient: 25  minutes  Past Psychiatric History: See H&P  Past Medical History:  Past Medical History:  Diagnosis Date  . Asthma   . Celiac disease   . Collagen vascular disease (HCC)   . Diarrhea    Patient mentions diagnosis of ulcerative colitis but it is not clear she actually has UC  . Endometriosis   . Lupus (HCC)   . Ovarian cyst   . Panic disorder 05/30/2020  . Pulmonary embolus (HCC) 02/06/2020    Past Surgical History:  Procedure Laterality Date  . APPENDECTOMY    . CHOLECYSTECTOMY    . EXCISION OF ENDOMETRIOMA     Ovarian cyst removal  . FOOT FRACTURE SURGERY     w/ hardware  . HERNIA REPAIR    . TONSILLECTOMY     Family History:  Family History  Problem Relation Age of Onset  . Hypertension Mother   . Hypercholesterolemia Mother   . Rheum arthritis Mother   . Diabetes Father   . Hypertension Father   . Cancer Father   . Autism Brother    Family Psychiatric  History: See H&P Social History:  Social History   Substance and Sexual Activity  Alcohol Use Never     Social History   Substance and Sexual Activity  Drug Use Never    Social History   Socioeconomic History  . Marital status: Single    Spouse name: Not on file  . Number of children: Not on file  . Years of education: Not on file  . Highest education level: Not on file  Occupational History  . Not on file  Tobacco Use  . Smoking status: Never Smoker  . Smokeless tobacco: Never Used  Vaping Use  . Vaping Use: Never used  Substance and Sexual Activity  . Alcohol use: Never  . Drug use: Never  . Sexual activity: Not on file  Other Topics Concern  . Not on file  Social History Narrative  . Not on file   Social Determinants of Health   Financial Resource Strain: Not on file  Food Insecurity: Not on file  Transportation Needs: Not on file  Physical Activity: Not on file  Stress: Not on file  Social Connections: Not on file   Additional Social History:                          Sleep: Poor  Appetite:  Poor  Current Medications: Current Facility-Administered Medications  Medication Dose Route Frequency Provider Last Rate Last Admin  . albuterol (PROVENTIL) (2.5 MG/3ML) 0.083% nebulizer solution 2.5 mg  2.5 mg Nebulization Q6H PRN Taylor, Cody W, PA-C      . alum & mag hydroxide-simeth (MAALOX/MYLANTA) 200-200-20 MG/5ML suspension 30 mL  30 mL Oral Q4H PRN Taylor, Cody W, PA-C      . apixaban (ELIQUIS) tablet 5 mg  5 mg Oral BID Taylor, Cody W, PA-C   5 mg at 05/30/20 1815  . arformoterol (BROVANA) nebulizer solution 15 mcg  15 mcg Nebulization BID Taylor, Cody W, PA-C      . budesonide (PULMICORT) nebulizer solution 0.5 mg  0.5 mg Nebulization BID Taylor, Cody W, PA-C      . EPINEPHrine (  EPI-PEN) injection 0.3 mg  0.3 mg Intramuscular Daily PRN Taylor, Cody W, PA-C      . fluticasone (FLONASE) 50 MCG/ACT nasal spray 1 spray  1 spray Each Nare Daily PRN Taylor, Cody W, PA-C      . hydroxychloroquine (PLAQUENIL) tablet 400 mg  400 mg Oral QHS Taylor, Cody W, PA-C   400 mg at 05/30/20 2348  . hydrOXYzine (ATARAX/VISTARIL) tablet 25 mg  25 mg Oral Q6H PRN ,  L, MD   25 mg at 05/31/20 0112  . lamoTRIgine (LAMICTAL) tablet 25 mg  25 mg Oral Daily ,  L, MD   25 mg at 05/31/20 0754  . loperamide (IMODIUM) capsule 2-4 mg  2-4 mg Oral PRN ,  L, MD      . LORazepam (ATIVAN) tablet 1 mg  1 mg Oral Q6H PRN ,  L, MD   1 mg at 05/31/20 0439  . magnesium hydroxide (MILK OF MAGNESIA) suspension 30 mL  30 mL Oral Daily PRN Taylor, Cody W, PA-C      . multivitamin with minerals tablet 1 tablet  1 tablet Oral Daily ,  L, MD   1 tablet at 05/30/20 1539  . Norethindrone-Ethinyl Estradiol-Fe Biphas (LO LOESTRIN FE) 1 MG-10 MCG / 10 MCG tablet 1 tablet  1 tablet Oral Daily Taylor, Cody W, PA-C   1 tablet at 05/31/20 0110  . promethazine (PHENERGAN) tablet 25 mg  25 mg Oral Q6H PRN Clary, Greg Lawson, MD   25 mg at 05/31/20 0636  .  revefenacin (YUPELRI) nebulizer solution 175 mcg  175 mcg Nebulization Daily Taylor, Cody W, PA-C      . sertraline (ZOLOFT) tablet 200 mg  200 mg Oral QHS Taylor, Cody W, PA-C   200 mg at 05/30/20 2348  . thiamine tablet 100 mg  100 mg Oral Daily ,  L, MD      . umeclidinium bromide (INCRUSE ELLIPTA) 62.5 MCG/INH 1 puff  1 puff Inhalation Daily Taylor, Cody W, PA-C      . zolpidem (AMBIEN) tablet 5 mg  5 mg Oral QHS PRN Taylor, Cody W, PA-C   5 mg at 05/30/20 2351    Lab Results:  Results for orders placed or performed during the hospital encounter of 05/30/20 (from the past 48 hour(s))  POC occult blood, ED Provider will collect     Status: None   Collection Time: 05/30/20  5:13 PM  Result Value Ref Range   Fecal Occult Bld NEGATIVE NEGATIVE  CBC with Differential     Status: Abnormal   Collection Time: 05/30/20  5:15 PM  Result Value Ref Range   WBC 11.7 (H) 4.0 - 10.5 K/uL   RBC 5.08 3.87 - 5.11 MIL/uL   Hemoglobin 11.2 (L) 12.0 - 15.0 g/dL   HCT 35.6 (L) 36.0 - 46.0 %   MCV 70.1 (L) 80.0 - 100.0 fL   MCH 22.0 (L) 26.0 - 34.0 pg   MCHC 31.5 30.0 - 36.0 g/dL   RDW 17.3 (H) 11.5 - 15.5 %   Platelets 656 (H) 150 - 400 K/uL   nRBC 0.0 0.0 - 0.2 %   Neutrophils Relative % 69 %   Neutro Abs 8.0 (H) 1.7 - 7.7 K/uL   Lymphocytes Relative 25 %   Lymphs Abs 2.9 0.7 - 4.0 K/uL   Monocytes Relative 5 %   Monocytes Absolute 0.6 0.1 - 1.0 K/uL   Eosinophils Relative 1 %   Eosinophils Absolute 0.1 0.0 -   0.5 K/uL   Basophils Relative 0 %   Basophils Absolute 0.0 0.0 - 0.1 K/uL   Immature Granulocytes 0 %   Abs Immature Granulocytes 0.03 0.00 - 0.07 K/uL    Comment: Performed at [facility], [place] [address] [place], [place] [address]  Comprehensive metabolic panel     Status: None   Collection Time: 05/30/20  5:15 PM  Result Value Ref Range   Sodium 138 135 - 145 mmol/L   Potassium 3.8 3.5 - 5.1 mmol/L   Chloride 108 98 - 111 mmol/L   CO2 22 22 - 32  mmol/L   Glucose, Bld 98 70 - 99 mg/dL    Comment: Glucose reference range applies only to samples taken after fasting for at least 8 hours.   BUN 6 6 - 20 mg/dL   Creatinine, Ser 0.70 0.44 - 1.00 mg/dL   Calcium 9.5 8.9 - 10.3 mg/dL   Total Protein 7.9 6.5 - 8.1 g/dL   Albumin 4.3 3.5 - 5.0 g/dL   AST 16 15 - 41 U/L   ALT 15 0 - 44 U/L   Alkaline Phosphatase 73 38 - 126 U/L   Total Bilirubin 0.5 0.3 - 1.2 mg/dL   GFR, Estimated >60 >60 mL/min    Comment: (NOTE) Calculated using the CKD-EPI Creatinine Equation (2021)    Anion gap 8 5 - 15    Comment: Performed at [facility], [place] [address] [place], [place] [address]  Lipase, blood     Status: None   Collection Time: 05/30/20  5:15 PM  Result Value Ref Range   Lipase 48 11 - 51 U/L    Comment: Performed at [facility], [place] [address], [place], [place] [address]  Acetaminophen level     Status: Abnormal   Collection Time: 05/30/20  5:15 PM  Result Value Ref Range   Acetaminophen (Tylenol), Serum <10 (L) 10 - 30 ug/mL    Comment: (NOTE) Therapeutic concentrations vary significantly. A range of 10-30 ug/mL  may be an effective concentration for many patients. However, some  are best treated at concentrations outside of this range. Acetaminophen concentrations >150 ug/mL at 4 hours after ingestion  and >50 ug/mL at 12 hours after ingestion are often associated with  toxic reactions.  Performed at [facility], [place] [address], [place], [place] [address]   TSH     Status: None   Collection Time: 05/30/20  5:15 PM  Result Value Ref Range   TSH 1.131 0.350 - 4.500 uIU/mL    Comment: Performed by a 3rd Generation assay with a functional sensitivity of <=0.01 uIU/mL. Performed at [facility], [place] [address] [place], [place] [address]   T4, free     Status: None   Collection Time: 05/30/20  5:15 PM  Result Value Ref Range   Free T4 0.77 0.61 - 1.12  ng/dL    Comment: (NOTE) Biotin ingestion may interfere with free T4 tests. If the results are inconsistent with the TSH level, previous test results, or the clinical presentation, then consider biotin interference. If needed, order repeat testing after stopping biotin. Performed at [facility], [place] [address], [place], [place] [address]   Lipid panel     Status: None   Collection Time: 05/30/20  5:15 PM  Result Value Ref Range   Cholesterol 146 0 - 200 mg/dL   Triglycerides 141 <150 mg/dL   HDL 45 >40 mg/dL  Total CHOL/HDL Ratio 3.2 RATIO   VLDL 28 0 - 40 mg/dL   LDL Cholesterol 73 0 - 99 mg/dL    Comment:        Total Cholesterol/HDL:CHD Risk Coronary Heart Disease Risk Table                     Men   Women  1/2 Average Risk   3.4   3.3  Average Risk       5.0   4.4  2 X Average Risk   9.6   7.1  3 X Average Risk  23.4   11.0        Use the calculated Patient Ratio above and the CHD Risk Table to determine the patient's CHD Risk.        ATP III CLASSIFICATION (LDL):  <100     mg/dL   Optimal  100-129  mg/dL   Near or Above                    Optimal  130-159  mg/dL   Borderline  160-189  mg/dL   High  >190     mg/dL   Very High Performed at [place] [address], [place], [place] [address]   Hemoglobin A1c     Status: Abnormal   Collection Time: 05/30/20  5:15 PM  Result Value Ref Range   Hgb A1c MFr Bld 6.3 (H) 4.8 - 5.6 %    Comment: (NOTE) Pre diabetes:          5.7%-6.4%  Diabetes:              >6.4%  Glycemic control for   <7.0% adults with diabetes    Mean Plasma Glucose 134.11 mg/dL    Comment: Performed at [place] [address] [place], [place] [address]  I-Stat beta hCG blood, ED     Status: None   Collection Time: 05/30/20  5:16 PM  Result Value Ref Range   I-stat hCG, quantitative <5.0 <5 mIU/mL   Comment 3            Comment:   GEST. AGE      CONC.  (mIU/mL)   <=1 WEEK        5 - 50     2 WEEKS        50 - 500     3 WEEKS       100 - 10,000     4 WEEKS     1,000 - 30,000        FEMALE AND NON-PREGNANT FEMALE:     LESS THAN 5 mIU/mL   CBC with Differential     Status: Abnormal   Collection Time: 05/30/20 10:50 PM  Result Value Ref Range   WBC 14.2 (H) 4.0 - 10.5 K/uL   RBC 4.86 3.87 - 5.11 MIL/uL   Hemoglobin 10.9 (L) 12.0 - 15.0 g/dL   HCT 34.1 (L) 36.0 - 46.0 %   MCV 70.2 (L) 80.0 - 100.0 fL   MCH 22.4 (L) 26.0 - 34.0 pg   MCHC 32.0 30.0 - 36.0 g/dL   RDW 17.2 (H) 11.5 - 15.5 %   Platelets 625 (H) 150 - 400 K/uL   nRBC 0.0 0.0 - 0.2 %   Neutrophils Relative % 92 %   Neutro Abs 13.0 (H) 1.7 - 7.7 K/uL   Lymphocytes Relative 7 %   Lymphs Abs 1.0  0.7 - 4.0 K/uL   Monocytes Relative 1 %   Monocytes Absolute 0.1 0.1 - 1.0 K/uL   Eosinophils Relative 0 %   Eosinophils Absolute 0.0 0.0 - 0.5 K/uL   Basophils Relative 0 %   Basophils Absolute 0.0 0.0 - 0.1 K/uL   Immature Granulocytes 0 %   Abs Immature Granulocytes 0.06 0.00 - 0.07 K/uL    Comment: Performed at [facility], [address] [place], [place] [address]    Blood Alcohol level:  Lab Results  Component Value Date   ETH <10 05/28/2020    Metabolic Disorder Labs: Lab Results  Component Value Date   HGBA1C 6.3 (H) 05/30/2020   MPG 134.11 05/30/2020   No results found for: PROLACTIN Lab Results  Component Value Date   CHOL 146 05/30/2020   TRIG 141 05/30/2020   HDL 45 05/30/2020   CHOLHDL 3.2 05/30/2020   VLDL 28 05/30/2020   LDLCALC 73 05/30/2020    Physical Findings: AIMS: Facial and Oral Movements Muscles of Facial Expression: None, normal Lips and Perioral Area: None, normal Jaw: None, normal Tongue: None, normal,Extremity Movements Upper (arms, wrists, hands, fingers): None, normal Lower (legs, knees, ankles, toes): None, normal, Trunk Movements Neck, shoulders, hips: None, normal, Overall Severity Severity of abnormal movements (highest score from questions above):  None, normal Incapacitation due to abnormal movements: None, normal Patient's awareness of abnormal movements (rate only patient's report): No Awareness, Dental Status Current problems with teeth and/or dentures?: No Does patient usually wear dentures?: No  CIWA:  CIWA-Ar Total: 3 COWS:     Musculoskeletal: Strength & Muscle Tone: within normal limits Gait & Station: normal Patient leans: N/A  Psychiatric Specialty Exam:  Presentation  General Appearance: Appropriate for Environment; Casual  Eye Contact:Good  Speech:Clear and Coherent (mildly increased rate)  Speech Volume:Normal  Handedness:No data recorded  Mood and Affect  Mood:Anxious  Affect:Congruent   Thought Process  Thought Processes:Coherent; Goal Directed  Descriptions of Associations:Intact  Orientation:Full (Time, Place and Person)  Thought Content:Logical  History of Schizophrenia/Schizoaffective disorder:No data recorded Duration of Psychotic Symptoms:No data recorded Hallucinations:Hallucinations: None  Ideas of Reference:None  Suicidal Thoughts:Suicidal Thoughts: No  Homicidal Thoughts:Homicidal Thoughts: No   Sensorium  Memory:Immediate Good; Recent Good; Remote Good  Judgment:Fair  Insight:Fair   Executive Functions  Concentration:Fair  Attention Span:Fair  Recall:Good  Fund of Knowledge:Good  Language:Good   Psychomotor Activity  Psychomotor Activity:Psychomotor Activity: Normal   Assets  Assets:Communication Skills; Desire for Improvement; Financial Resources/Insurance; Housing; Resilience; Social Support; Talents/Skills; Vocational/Educational   Sleep  Sleep:Sleep: Poor    Physical Exam: Physical Exam Vitals and nursing note reviewed.  HENT:     Head: Normocephalic and atraumatic.  Pulmonary:     Effort: Pulmonary effort is normal.  Neurological:     General: No focal deficit present.     Mental Status: She is alert and oriented to person, place, and  time.    Review of Systems  Constitutional: Negative for chills, diaphoresis and fever.  HENT: Negative for hearing loss.   Eyes: Negative for blurred vision.  Respiratory: Negative for cough and shortness of breath.   Cardiovascular: Negative for chest pain and palpitations.  Gastrointestinal: Positive for abdominal pain, nausea and vomiting. Negative for constipation and diarrhea.  Musculoskeletal: Negative.   Skin: Negative for rash.  Neurological: Negative for dizziness, tremors, seizures and headaches.  Psychiatric/Behavioral: Negative for hallucinations, substance abuse and suicidal ideas. The patient is nervous/anxious and has insomnia.    Blood   pressure 133/86, pulse (!) 112, temperature 98 F (36.7 C), resp. rate 19, height 5' 2" (1.575 m), weight 109.5 kg, SpO2 98 %. Body mass index is 44.17 kg/m.   Treatment Plan Summary: Dairen Lapointis a 24-year-old female with multiple medical problems, panic disorder, self-reported history of PTSD and mood disorder (depression versus bipolar type II disorder) who was admitted for worsening anxiety, depression and suicidal ideation which culminated in an overdose in the context of multiple psychosocial stressors. Patient has some history that is concerning for possible underlying bipolar diathesis although she does not give a history of past episodes that clearly meet clear criteria for past manic or hypomanic episode.  Patient continues to report no wish for death or suicidal ideation and denies depressed mood.  She presents as anxious which is likely her baseline.  Daily contact with patient to assess and evaluate symptoms and progress in treatment and Medication management   Encourage participation in group therapy and therapeutic milieu  Anxiety -Continue sertraline 200 mg daily for panic disorder, anxiety and depressive symptoms -Continue hydroxyzine 25 mg 3 times daily as needed  Mood symptoms -Continue sertraline 200 mg daily for  panic disorder, anxiety and depressive symptoms -Continue lamotrigine 25 mg daily  Insomnia -Continue Ambien 5 mg at bedtime as needed  GI symptoms -Continue Phenergan 25 mg every 6 hours as needed nausea vomiting -Patient will follow-up with outpatient gastroenterologist after discharge  Continue medications for lupus, history of pulmonary emboli as documented in the MAR  Disposition planning is ongoing   L , MD 05/31/2020, 3:27 PM 

## 2020-05-31 NOTE — Progress Notes (Signed)
Pt came up to the nurses station twice tonight complaining of left upper abdominal pain rated an 8 on a scale of 0-10 (10 being the worse). She also complained of nausea and a hard time sleeping. Pt had consumed some sour cream and onion lays chips and ginger ale. Talked to pt since she has suspected ulcerative colitis versus Crohn's disease. Educated pt that the abdominal pain can possibly be due to the chips she ate. Informed pt to avoid greasy foods which can aggravate pain in someone with these diagnoses.  Pt was provided a heating pack which she said helped a little bit. Education was reinforced that she needs to give the medication time to take effect. Please see MAR for list of medications administered to pt while she was in the ER. Also informed her that enough time hasn't lapsed for her to receive anything else at the moment. NP, Darrol Angel was notified and updated as well. No new orders have been placed. Pt was encouraged to rest with her lights off and door closed because she said the noise of other pt's snoring has been bothering her. Pt said that she has been trying to sleep by laying down, but every time this writer went to assess her she was up reading a book. Encouraged pt to rest, relax, active listening, reassurance, and support provided. Pt also said that she is having a hard time sleeping because it isn't her own bed that she is sleeping in. Pt has been restless, anxious, and fidgety. NP, Darrol Angel was informed and ordered for her PRN 1 mg of ativan to be administered. It was administered at 0439. Q 15 min safety checks continue. Pt's safety has been maintained.

## 2020-05-31 NOTE — ED Notes (Signed)
Safe transport called for transport back to [facility]

## 2020-05-31 NOTE — Progress Notes (Signed)
Pt has been transferred back over to [facility] from [facility] and arrived at [facility]. Pt was originally sent over for evaluation because of severe abdominal pain, nausea/vomiting not relieved by phenergan, and black tarry stools. Blood work was obtained there and was consistent for her baseline given her medical history. CT of abdomen pelvis was obtained and showed no acute changes besides some constipation. Since pt was over at [facility] she had no episodes of nausea, vomiting, or black tarry stools. Please see MAR for the list of several scheduled medications along with one time medications administered for her abdominal pain. Upon this writers assessment she still c/o left upper abdominal pain rated a 5 on a scale of 0-10 (10 being the worse) and nausea. Pt's vitals at 0040 were blood pressure of 154/98 and pulse of 123. NP, Darrol Angel was informed of vitals and pt's request for more pain medication to keep her pain down to a tolerable level. Given that pt was administered several pain medications in the ER not too long ago, we will hold off on administering anything else. Pt was educated and informed. She verbally demonstrated understanding. She was administered her birth control pill that she didn't receive earlier. She was also given 25 mg of vistaril at 0112 to help with her nausea. Educated pt to start off with clear liquids and easy to digest, bland foods. Pt was provided a snack and some ginger ale. She said that she only had a little bit of fluids in the ER, but was able to keep it down. Notified pt to inform this writer if she has any nausea/vomiting, tarry stools, or worsening abdominal pain. Pt has taken a shower, sent her clothes to be washed, and is currently in her room. Q 15 min safety checks continue. Pt's safety has been maintained.

## 2020-06-01 ENCOUNTER — Other Ambulatory Visit: Payer: Self-pay

## 2020-06-01 ENCOUNTER — Emergency Department (HOSPITAL_BASED_OUTPATIENT_CLINIC_OR_DEPARTMENT_OTHER)
Admission: EM | Admit: 2020-06-01 | Discharge: 2020-06-01 | Disposition: A | Discharge status: Home / Self Care | Payer: PPO | Attending: Emergency Medicine | Admitting: Emergency Medicine

## 2020-06-01 ENCOUNTER — Encounter (HOSPITAL_BASED_OUTPATIENT_CLINIC_OR_DEPARTMENT_OTHER): Payer: Self-pay | Admitting: Emergency Medicine

## 2020-06-01 DIAGNOSIS — Z7901 Long term (current) use of anticoagulants: Secondary | ICD-10-CM | POA: Insufficient documentation

## 2020-06-01 DIAGNOSIS — J45909 Unspecified asthma, uncomplicated: Secondary | ICD-10-CM | POA: Diagnosis not present

## 2020-06-01 DIAGNOSIS — R1084 Generalized abdominal pain: Secondary | ICD-10-CM

## 2020-06-01 DIAGNOSIS — Z91010 Allergy to peanuts: Secondary | ICD-10-CM | POA: Diagnosis not present

## 2020-06-01 DIAGNOSIS — Z7951 Long term (current) use of inhaled steroids: Secondary | ICD-10-CM | POA: Diagnosis not present

## 2020-06-01 DIAGNOSIS — F332 Major depressive disorder, recurrent severe without psychotic features: Principal | ICD-10-CM

## 2020-06-01 DIAGNOSIS — N3000 Acute cystitis without hematuria: Secondary | ICD-10-CM | POA: Diagnosis not present

## 2020-06-01 LAB — CBC WITH DIFFERENTIAL/PLATELET
Abs Immature Granulocytes: 0.02 10*3/uL (ref 0.00–0.07)
Basophils Absolute: 0.1 10*3/uL (ref 0.0–0.1)
Basophils Relative: 1 %
Eosinophils Absolute: 0.1 10*3/uL (ref 0.0–0.5)
Eosinophils Relative: 1 %
HCT: 34.6 % — ABNORMAL LOW (ref 36.0–46.0)
Hemoglobin: 11 g/dL — ABNORMAL LOW (ref 12.0–15.0)
Immature Granulocytes: 0 %
Lymphocytes Relative: 30 %
Lymphs Abs: 3.2 10*3/uL (ref 0.7–4.0)
MCH: 22.4 pg — ABNORMAL LOW (ref 26.0–34.0)
MCHC: 31.8 g/dL (ref 30.0–36.0)
MCV: 70.3 fL — ABNORMAL LOW (ref 80.0–100.0)
Monocytes Absolute: 0.6 10*3/uL (ref 0.1–1.0)
Monocytes Relative: 6 %
Neutro Abs: 6.5 10*3/uL (ref 1.7–7.7)
Neutrophils Relative %: 62 %
Platelets: 611 10*3/uL — ABNORMAL HIGH (ref 150–400)
RBC: 4.92 MIL/uL (ref 3.87–5.11)
RDW: 18.3 % — ABNORMAL HIGH (ref 11.5–15.5)
WBC: 10.4 10*3/uL (ref 4.0–10.5)
nRBC: 0 % (ref 0.0–0.2)

## 2020-06-01 LAB — COMPREHENSIVE METABOLIC PANEL
ALT: 13 U/L (ref 0–44)
AST: 15 U/L (ref 15–41)
Albumin: 4.5 g/dL (ref 3.5–5.0)
Alkaline Phosphatase: 62 U/L (ref 38–126)
Anion gap: 12 (ref 5–15)
BUN: 7 mg/dL (ref 6–20)
CO2: 25 mmol/L (ref 22–32)
Calcium: 9.6 mg/dL (ref 8.9–10.3)
Chloride: 105 mmol/L (ref 98–111)
Creatinine, Ser: 0.82 mg/dL (ref 0.44–1.00)
GFR, Estimated: >60 mL/min (ref >60)
Glucose, Bld: 94 mg/dL (ref 70–99)
Potassium: 3.9 mmol/L (ref 3.5–5.1)
Sodium: 142 mmol/L (ref 135–145)
Total Bilirubin: 0.3 mg/dL (ref 0.3–1.2)
Total Protein: 7.5 g/dL (ref 6.5–8.1)

## 2020-06-01 LAB — URINALYSIS, MICROSCOPIC (REFLEX): RBC / HPF: >50 RBC/hpf (ref 0–5)

## 2020-06-01 LAB — URINALYSIS, ROUTINE W REFLEX MICROSCOPIC

## 2020-06-01 LAB — PREGNANCY, URINE: Preg Test, Ur: NEGATIVE

## 2020-06-01 LAB — ACETAMINOPHEN LEVEL: Acetaminophen (Tylenol), Serum: <10 ug/mL — ABNORMAL LOW (ref 10–30)

## 2020-06-01 LAB — LIPASE, BLOOD: Lipase: 61 U/L — ABNORMAL HIGH (ref 11–51)

## 2020-06-01 MED ORDER — LAMOTRIGINE 25 MG PO TABS: 25.0000 mg | ORAL_TABLET | Freq: Every day | ORAL | 0 refills | Status: DC

## 2020-06-01 MED ORDER — CIPROFLOXACIN HCL 500 MG PO TABS: 500.0000 mg | ORAL_TABLET | Freq: Two times a day (BID) | ORAL | 0 refills | Status: AC

## 2020-06-01 MED ORDER — EPINEPHRINE 0.3 MG/0.3ML IJ SOAJ: 0.3000 mg | Freq: Every day | INTRAMUSCULAR | 0 refills | Status: DC | PRN

## 2020-06-01 MED ORDER — MORPHINE SULFATE (PF) 4 MG/ML IV SOLN
4.0000 mg | Freq: Once | INTRAVENOUS | Status: AC
  Administered 2020-06-01: 4 mg via INTRAVENOUS
  Filled 2020-06-01: qty 1

## 2020-06-01 MED ORDER — UMECLIDINIUM BROMIDE 62.5 MCG/INH IN AEPB: 1.0000 | INHALATION_SPRAY | Freq: Every day | RESPIRATORY_TRACT | Status: DC

## 2020-06-01 MED ORDER — HYDROXYZINE HCL 25 MG PO TABS: 25.0000 mg | ORAL_TABLET | Freq: Four times a day (QID) | ORAL | 0 refills | Status: DC | PRN

## 2020-06-01 MED ORDER — ZOLPIDEM TARTRATE 5 MG PO TABS: 5.0000 mg | ORAL_TABLET | Freq: Every evening | ORAL | 0 refills | Status: DC | PRN

## 2020-06-01 MED ORDER — TRAMADOL HCL 50 MG PO TABS: 50.0000 mg | ORAL_TABLET | Freq: Four times a day (QID) | ORAL | 0 refills | Status: DC | PRN

## 2020-06-01 MED ORDER — SODIUM CHLORIDE 0.9 % IV BOLUS
1000.0000 mL | Freq: Once | INTRAVENOUS | Status: AC
  Administered 2020-06-01: 1000 mL via INTRAVENOUS

## 2020-06-01 MED ORDER — PROMETHAZINE HCL 25 MG/ML IJ SOLN
INTRAMUSCULAR | Status: AC
  Administered 2020-06-01: 25 mg
  Filled 2020-06-01: qty 1

## 2020-06-01 MED ORDER — SODIUM CHLORIDE 0.9 % IV SOLN
25.0000 mg | Freq: Four times a day (QID) | INTRAVENOUS | Status: DC | PRN
  Filled 2020-06-01: qty 1

## 2020-06-01 MED ORDER — SUMATRIPTAN SUCCINATE 25 MG PO TABS
25.0000 mg | ORAL_TABLET | Freq: Once | ORAL | Status: AC
  Administered 2020-06-01: 25 mg via ORAL
  Filled 2020-06-01 (×2): qty 1

## 2020-06-01 MED ORDER — SERTRALINE HCL 100 MG PO TABS: 200.0000 mg | ORAL_TABLET | Freq: Every day | ORAL | 0 refills | Status: DC

## 2020-06-01 NOTE — [facility] Group Notes (Signed)
Adult Psychoeducational Group Note  Date:  06/01/2020 Time:  10:52 AM  Group Topic/Focus:  Personal Choices and Values:   The focus of this group is to help patients assess and explore the importance of values in their lives, how their values affect their decisions, how they express their values and what opposes their expression.  Participation Level:  Active  Participation Quality:  Appropriate  Affect:  Appropriate  Cognitive:  Appropriate  Insight: Appropriate and Good  Engagement in Group:  Engaged  Modes of Intervention:  Discussion  Additional Comments:  Pt attended morning goals group and the education discussion.    [name] 06/01/2020, 10:52 AM

## 2020-06-01 NOTE — Progress Notes (Signed)
Pt continues to endorse anxiety, pt stated she was doing ok. Writer discussed alternatives to medication for anxiety with relaxation techniques.     06/01/20 0000  Psych Admission Type (Psych Patients Only)  Admission Status Voluntary  Psychosocial Assessment  Patient Complaints Anxiety  Eye Contact Fair  Facial Expression Animated;Anxious  Affect Anxious;Appropriate to circumstance;Depressed  Speech Logical/coherent;Pressured  Interaction Assertive  Motor Activity Restless  Appearance/Hygiene Unremarkable  Behavior Characteristics Anxious  Mood Anxious  Thought Process  Coherency WDL  Content Blaming others  Delusions None reported or observed  Perception WDL  Hallucination None reported or observed  Judgment Poor  Confusion None  Danger to Self  Current suicidal ideation? Denies  Danger to Others  Danger to Others None reported or observed

## 2020-06-01 NOTE — Progress Notes (Signed)
Recreation Therapy Notes  Date: 5.11.22 Time: 0930 Location: [address] Dayroom  Group Topic: Stress Management   Goal Area(s) Addresses:  Patient will actively participate in stress management techniques presented during session.  Patient will successfully identify benefit of practicing stress management post d/c.   Behavioral Response: Appropriate  Intervention: Guided exercise with ambient sound and script  Activity :Guided Imagery  LRT read a script that focused enjoying the sights and sounds of being in a wildlife sanctuary.  Patients were to listen, focus on their breathing and relax to follow along as script was being read.  Education:  Stress Management, Discharge Planning.   Education Outcome: Acknowledges education  Clinical Observations/Feedback: Patient actively engaged in technique introduced, expressed no concerns.     Victorino Sparrow, LRT/CTRS        Victorino Sparrow A 06/01/2020 10:46 AM

## 2020-06-01 NOTE — Progress Notes (Signed)
  [facility] Adult Case Management Discharge Plan :  Will you be returning to the same living situation after discharge:  No. with father in [place] At discharge, do you have transportation home?: Yes,  vai father Do you have the ability to pay for your medications: Yes,  has income  Release of information consent forms completed and in the chart;  Patient's signature needed at discharge.  Patient to Follow up at:  Follow-up Information    Angel L. Adams,NP Follow up on 06/02/2020.   Why: You have an appointment for medication managemenet services at 06/02/20 at 9:00 am.  This will be a Virtual appointment.  Contact information: [address] [place] [address]  P: [phone], [phone]        [facility]. Schedule an appointment as soon as possible for a visit.   Why: Please contact your provider for online therapy services.  Contact information: [email or web address]              Next level of care provider has access to [place] and Suicide Prevention discussed: Yes,  w/ father  Have you used any form of tobacco in the last 30 days? (Cigarettes, Smokeless Tobacco, Cigars, and/or Pipes): No  Has patient been referred to the Quitline?: N/A patient is not a smoker  Patient has been referred for addiction treatment: Wagoner, Latanya Presser 06/01/2020, 9:33 AM
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
## 2020-06-01 NOTE — Plan of Care (Signed)
Nurse discussed anxiety and coping skills with patient. 

## 2020-06-01 NOTE — Progress Notes (Signed)
D:  Patient's self inventory sheet, patient has fair sleep, sleep medication helpful.  Poor appetite, normal energy level, good concentration.  Denied depression and hopeless.  Anxiety #3.  Denied withdrawals.  Denied SI.  Physical problems, lightheaded, pain, dizzy, headaches.  Abdomen and pain #7 highest in past 24 hours.  Goal is discharge.  Plans to be positive.  Does have discharge plan. A:  Medications administered per MD orders.  Emotional support and encouragement given patient. R:  Safety maintained with 15 minute checks.
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
## 2020-06-01 NOTE — ED Triage Notes (Signed)
Discharged from [facility] this morning. Pt had been admitted for taking an excess of tylenol. Pt had been treated charcoal.  Pt complains of lingering abdominal pain and nausea.

## 2020-06-01 NOTE — ED Provider Notes (Signed)
[facility] EMERGENCY DEPT Provider Note   CSN: [id number] Arrival date & time: 06/01/20  1519     History Chief Complaint  Patient presents with  . Abdominal Pain    [name] is a 25 y.o. female.  Patient presents chief complaint of persistent abdominal pain.  Describes ongoing for the last several days.  She was recently admitted to a behavioral health hospital for attempted suicide with Tylenol overdose.  She denies any recent Tylenol use.  Describes abdominal pain is persistent and unchanged.  Sharp and aching diffuse in the mid abdominal region.  Does not radiate elsewhere no fevers no cough no vomiting or diarrhea per patient.  She was just discharged from the hospital today.        Past Medical History:  Diagnosis Date  . Asthma   . Celiac disease   . Collagen vascular disease ([place])   . Diarrhea    Patient mentions diagnosis of ulcerative colitis but it is not clear she actually has UC  . Endometriosis   . Lupus ([place])   . Ovarian cyst   . Panic disorder 05/30/2020  . Pulmonary embolus ([place]) 02/06/2020    Patient Active Problem List   Diagnosis Date Noted  . 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 Panic disorder 05/30/2020  . Major depressive disorder 05/29/2020  . Suicide attempt by acetaminophen overdose ([place]) 05/29/2020  . Obesity, Class III, BMI 40-49.9 (morbid obesity) ([place]) 04/05/2020  . SOB (shortness of breath) 04/05/2020  . Abdominal pain 04/04/2020  . SLE (systemic lupus erythematosus related syndrome) ([place]) 04/04/2020  . Shortness of breath 04/04/2020  . Pneumonia 03/23/2020  . Chronic nausea 03/23/2020  . Asthma 03/22/2020  . Dysuria 02/03/2020  . Hematuria 02/03/2020  . Mass of urinary bladder 01/26/2020  . Allergic rhinitis 05/05/2019  . 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 Endometriosis 03/14/2018  . Long-term use of high-risk medication 07/02/2017  . Lupus anticoagulant  positive 07/02/2017  . Personal history of pulmonary embolism 07/02/2017  . Other forms of systemic lupus erythematosus ([place]) 03/12/2017  . Anemia 02/28/2017  . Migraine 02/28/2017  . Dysmenorrhea 08/28/2011  . Gastritis 08/28/2011    Past Surgical History:  Procedure Laterality Date  . APPENDECTOMY    . CHOLECYSTECTOMY    . EXCISION OF ENDOMETRIOMA     Ovarian cyst removal  . FOOT FRACTURE SURGERY     w/ hardware  . HERNIA REPAIR    . TONSILLECTOMY       OB History   No obstetric history on file.     Family History  Problem Relation Age of Onset  . Hypertension Mother   . Hypercholesterolemia Mother   . Rheum arthritis Mother   . Diabetes Father   . Hypertension Father   . Cancer Father   . Autism Brother     Social History   Tobacco Use  . Smoking status: Never Smoker  . Smokeless tobacco: Never Used  Vaping Use  . Vaping Use: Never used  Substance Use Topics  . Alcohol use: Never  . Drug use: Never    Home Medications Prior to Admission medications   Medication Sig Start Date End Date Taking? Authorizing Provider  apixaban (ELIQUIS) 5 MG TABS tablet Take 5 mg by mouth 2 (two) times daily.   Yes [provider]  ciprofloxacin (CIPRO) 500 MG tablet Take 1 tablet (500 mg total) by mouth every 12 (twelve) hours for 4 days. 06/01/20 06/05/20 Yes [place],  Greggory Brandy, MD  hydroxychloroquine (PLAQUENIL) 200 MG tablet Take 2 tablets (400 mg total) by mouth daily. Patient taking differently: Take 400 mg by mouth at bedtime. 03/23/20  Yes Rice, Resa Miner, MD  hydrOXYzine (ATARAX/VISTARIL) 25 MG tablet Take 1 tablet (25 mg total) by mouth every 6 (six) hours as needed (anxiety). 06/01/20  Yes Lindell Spar I, NP  lamoTRIgine (LAMICTAL) 25 MG tablet Take 1 tablet (25 mg total) by mouth daily. For mood stabilization 06/02/20  Yes Nwoko, Herbert Pun I, NP  Norethindrone-Ethinyl Estradiol-Fe Biphas (LO LOESTRIN FE) 1 MG-10 MCG / 10 MCG tablet Take 1 tablet by mouth daily.  05/11/20 08/09/20 Yes Wieters, Hallie C, PA-C  sertraline (ZOLOFT) 100 MG tablet Take 2 tablets (200 mg total) by mouth at bedtime. For depression 06/01/20  Yes Nwoko, Herbert Pun I, NP  traMADol (ULTRAM) 50 MG tablet Take 1 tablet (50 mg total) by mouth every 6 (six) hours as needed for up to 5 doses. 06/01/20  Yes Luna Fuse, MD  zolpidem (AMBIEN) 5 MG tablet Take 1 tablet (5 mg total) by mouth at bedtime as needed for sleep. 06/01/20  Yes Lindell Spar I, NP  albuterol (PROVENTIL) (2.5 MG/3ML) 0.083% nebulizer solution Take 3 mLs (2.5 mg total) by nebulization every 6 (six) hours as needed for wheezing or shortness of breath. 03/19/20   Volney American, PA-C  arformoterol (BROVANA) 15 MCG/2ML NEBU Take 2 mLs (15 mcg total) by nebulization 2 (two) times daily. 04/06/20   Oswald Hillock, MD  budesonide (PULMICORT) 0.5 MG/2ML nebulizer solution Take 2 mLs (0.5 mg total) by nebulization 2 (two) times daily. 04/06/20   Oswald Hillock, MD  EPINEPHrine 0.3 mg/0.3 mL IJ SOAJ injection Inject 0.3 mg into the muscle daily as needed (Allergic Reaction). 06/01/20   Lindell Spar I, NP  fluticasone (FLONASE) 50 MCG/ACT nasal spray Place 1 spray into both nostrils daily as needed for allergies or rhinitis.    [provider]  montelukast (SINGULAIR) 10 MG tablet Take 1 tablet (10 mg total) by mouth at bedtime. Patient not taking: Reported on 05/28/2020 05/11/20   Wieters, Elesa Hacker, PA-C  revefenacin (YUPELRI) 175 MCG/3ML nebulizer solution Take 3 mLs (175 mcg total) by nebulization daily. 04/07/20   Oswald Hillock, MD  umeclidinium bromide (INCRUSE ELLIPTA) 62.5 MCG/INH AEPB Inhale 1 puff into the lungs daily. For breathing problems. 06/01/20   Lindell Spar I, NP  dicyclomine (BENTYL) 20 MG tablet Take 1 tablet (20 mg total) by mouth 2 (two) times daily. 11/29/18 04/11/19  Muthersbaugh, Jarrett Soho, PA-C  Fluticasone Propionate HFA (FLOVENT HFA IN) Inhale into the lungs. Patient not taking: Reported on 11/14/2019  11/14/19   [provider]    Allergies    Azithromycin, Peanut-containing drug products, Iodinated diagnostic agents, Other, Amoxicillin, Fish allergy, Gluten meal, Hydromet [hydrocodone bit-homatrop mbr], Ibuprofen, Lactose intolerance (gi), and Pantoprazole  Review of Systems   Review of Systems  Constitutional: Negative for fever.  HENT: Negative for ear pain.   Eyes: Negative for pain.  Respiratory: Negative for cough.   Cardiovascular: Negative for chest pain.  Gastrointestinal: Positive for abdominal pain.  Genitourinary: Negative for flank pain.  Musculoskeletal: Negative for back pain.  Skin: Negative for rash.  Neurological: Negative for headaches.    Physical Exam Updated Vital Signs BP 140/69 (BP Location: Right Arm)   Pulse 100   Temp 99.4 F (37.4 C) (Oral)   Resp 16   Ht 5' 6"  (1.676 m)   Wt 104.3  kg   LMP 04/25/2020 (Approximate)   SpO2 96%   BMI 37.12 kg/m   Physical Exam Constitutional:      General: She is not in acute distress.    Appearance: Normal appearance.  HENT:     Head: Normocephalic.     Nose: Nose normal.  Eyes:     Extraocular Movements: Extraocular movements intact.  Cardiovascular:     Rate and Rhythm: Normal rate.  Pulmonary:     Effort: Pulmonary effort is normal.  Abdominal:     Tenderness: There is abdominal tenderness in the periumbilical area.  Musculoskeletal:        General: Normal range of motion.     Cervical back: Normal range of motion.  Neurological:     General: No focal deficit present.     Mental Status: She is alert. Mental status is at baseline.     ED Results / Procedures / Treatments   Labs (all labs ordered are listed, but only abnormal results are displayed) Labs Reviewed  CBC WITH DIFFERENTIAL/PLATELET - Abnormal; Notable for the following components:      Result Value   Hemoglobin 11.0 (*)    HCT 34.6 (*)    MCV 70.3 (*)    MCH 22.4 (*)    RDW 18.3 (*)    Platelets 611 (*)    All other  components within normal limits  LIPASE, BLOOD - Abnormal; Notable for the following components:   Lipase 61 (*)    All other components within normal limits  URINALYSIS, ROUTINE W REFLEX MICROSCOPIC - Abnormal; Notable for the following components:   Color, Urine RED (*)    APPearance TURBID (*)    Glucose, UA   (*)    Value: TEST NOT REPORTED DUE TO COLOR INTERFERENCE OF URINE PIGMENT   Hgb urine dipstick   (*)    Value: TEST NOT REPORTED DUE TO COLOR INTERFERENCE OF URINE PIGMENT   Bilirubin Urine   (*)    Value: TEST NOT REPORTED DUE TO COLOR INTERFERENCE OF URINE PIGMENT   Ketones, ur   (*)    Value: TEST NOT REPORTED DUE TO COLOR INTERFERENCE OF URINE PIGMENT   Protein, ur   (*)    Value: TEST NOT REPORTED DUE TO COLOR INTERFERENCE OF URINE PIGMENT   Nitrite   (*)    Value: TEST NOT REPORTED DUE TO COLOR INTERFERENCE OF URINE PIGMENT   Leukocytes,Ua   (*)    Value: TEST NOT REPORTED DUE TO COLOR INTERFERENCE OF URINE PIGMENT   All other components within normal limits  ACETAMINOPHEN LEVEL - Abnormal; Notable for the following components:   Acetaminophen (Tylenol), Serum <10 (*)    All other components within normal limits  URINALYSIS, MICROSCOPIC (REFLEX) - Abnormal; Notable for the following components:   Bacteria, UA FEW (*)    All other components within normal limits  COMPREHENSIVE METABOLIC PANEL  PREGNANCY, URINE    EKG None  Radiology CT ABDOMEN PELVIS W CONTRAST  Result Date: 05/30/2020 CLINICAL DATA:  Acute abdominal pain. EXAM: CT ABDOMEN AND PELVIS WITH CONTRAST TECHNIQUE: Multidetector CT imaging of the abdomen and pelvis was performed using the standard protocol following bolus administration of intravenous contrast. CONTRAST:  116m OMNIPAQUE IOHEXOL 300 MG/ML  SOLN COMPARISON:  Multiple priors, most recently 05/05/2020. FINDINGS: Lower chest: The lung bases are clear. No acute airspace disease or pleural effusion. Hepatobiliary: Small cyst in the right dome  of the liver. No new or suspicious hepatic lesions.  Clips in the gallbladder fossa postcholecystectomy. No biliary dilatation. Pancreas: No ductal dilatation or inflammation. Spleen: Normal in size without focal abnormality. Adrenals/Urinary Tract: Normal adrenal glands. No hydronephrosis or perinephric edema. Homogeneous renal enhancement. No visualized renal calculi or focal lesions. Urinary bladder is physiologically distended without wall thickening. Stomach/Bowel: Unremarkable stomach. No small bowel obstruction, wall thickening, or inflammation. Appendectomy. Moderate colonic stool burden without wall thickening or inflammation. No abnormal rectal distention. Vascular/Lymphatic: Normal caliber abdominal aorta. No acute vascular findings. Patent portal vein. No abdominopelvic adenopathy. Reproductive: Unremarkable uterus. Right ovary is tentatively visualized and normal. The left ovary is not seen. No suspicious adnexal mass. Other: No free air, free fluid, or intra-abdominal fluid collection. Musculoskeletal: There are no acute or suspicious osseous abnormalities. IMPRESSION: 1. No acute abnormality in the abdomen/pelvis. 2. Moderate colonic stool burden, recommend correlation for constipation. Electronically Signed   By: Keith Rake M.D.   On: 05/30/2020 22:29    Procedures Procedures   Medications Ordered in ED Medications  morphine 4 MG/ML injection 4 mg (4 mg Intravenous Given 06/01/20 1637)  promethazine (PHENERGAN) 25 MG/ML injection (25 mg  Given 06/01/20 1639)  sodium chloride 0.9 % bolus 1,000 mL (0 mLs Intravenous Stopped 06/01/20 1843)    ED Course  I have reviewed the triage vital signs and the nursing notes.  Pertinent labs & imaging results that were available during my care of the patient were reviewed by me and considered in my medical decision making (see chart for details).    MDM Rules/Calculators/A&P                          Levan, 2 unremarkable.  Tylenol levels  undetectable liver enzymes normal.  Urinalysis showing some squamous cells but few bacteria.  Given her symptoms elected to treat her.  Given tramadol for pain management at home, advised to follow-up with GI or primary care doctor within the week.  Advising immediate return for worsening pain fevers or any additional concerns.  Final Clinical Impression(s) / ED Diagnoses Final diagnoses:  Generalized abdominal pain  Acute cystitis without hematuria    Rx / DC Orders ED Discharge Orders         Ordered    traMADol (ULTRAM) 50 MG tablet  Every 6 hours PRN        06/01/20 1910    ciprofloxacin (CIPRO) 500 MG tablet  Every 12 hours        06/01/20 1910           Luna Fuse, MD 06/01/20 1910

## 2020-06-01 NOTE — Progress Notes (Signed)
   06/01/20 0500  Sleep  Number of Hours 4.75

## 2020-06-01 NOTE — Progress Notes (Signed)
Discharge Note:  Patient discharged home with family member.  Suicide prevention information given and discussed with patient who stated she understood and had no questions.  Patient denied SI and HI.  Denied A/V hallucinations.  Patient stated she received all her belongings, clothing, etc.  Patient stated she appreciated all assistance at [facility]  All discharge information given to patient.

## 2020-06-01 NOTE — [facility] Suicide Risk Assessment (Signed)
[facility] INPATIENT:  Family/Significant Other Suicide Prevention Education  Suicide Prevention Education:  Education Completed; Celines Femia [phone] (Father) has been identified by the patient as the family member/significant other with whom the patient will be residing, and identified as the person(s) who will aid the patient in the event of a mental health crisis (suicidal ideations/suicide attempt).  With written consent from the patient, the family member/significant other has been provided the following suicide prevention education, prior to the and/or following the discharge of the patient.  The suicide prevention education provided includes the following:  Suicide risk factors  Suicide prevention and interventions  National Suicide Hotline telephone number  [facility] assessment telephone number  [facility] Emergency Assistance [place] and/or Residential Mobile Crisis Unit telephone number  Request made of family/significant other to:  Remove weapons (e.g., guns, rifles, knives), all items previously/currently identified as safety concern.    Remove drugs/medications (over-the-counter, prescriptions, illicit drugs), all items previously/currently identified as a safety concern.  The family member/significant other verbalizes understanding of the suicide prevention education information provided.  The family member/significant other agrees to remove the items of safety concern listed above.  CSW and Doctor Jeneen Rinks spoke with Mr. Troxler who states that his daughter had a similar incident in [place] approximately a year ago where she attempted suicide by taking pills.  Mr. Hedstrom states that this happens when she stops taking her medications.  Mr. Pappalardo states that his daughter has been resistant to therapy but he will be getting her set up for services in [facility] and will make sure that she takes her medications.  Mr. Ortez states that his daughter will be  coming back to [facility] with him after discharge and will be staying at the home with his wife during the day so there will be someone to supervise each day.  Mr. Nowaczyk states that there is one firearm in the home but it is locked in a safe and he is the only one who has access to it.  CSW completed SPE with Mr. Birchmeier.  (SPE call was placed on 05/31/20 at 3:00pm)   Darleen Crocker 06/01/2020, 2:40 PM

## 2020-06-01 NOTE — ED Notes (Signed)
This RN presented the AVS utilizing Teachback Method. Patient verbalizes understanding of Discharge Instructions. Opportunity for Questioning and Answers were provided. Patient Discharged from ED ambulatory to Home.

## 2020-06-01 NOTE — Discharge Instructions (Signed)
Call your primary care doctor or specialist as discussed in the next 2-3 days.   Return immediately back to the ER if:  Your symptoms worsen within the next 12-24 hours. You develop new symptoms such as new fevers, persistent vomiting, new pain, shortness of breath, or new weakness or numbness, or if you have any other concerns.  

## 2020-06-01 NOTE — [facility] Group Notes (Signed)
Pt did not attend wrap-up group

## 2020-06-06 ENCOUNTER — Emergency Department (HOSPITAL_COMMUNITY): Payer: PPO

## 2020-06-06 ENCOUNTER — Emergency Department (HOSPITAL_COMMUNITY)
Admission: EM | Admit: 2020-06-06 | Discharge: 2020-06-06 | Disposition: A | Discharge status: Home / Self Care | Payer: PPO | Attending: Emergency Medicine | Admitting: Emergency Medicine

## 2020-06-06 ENCOUNTER — Encounter (HOSPITAL_COMMUNITY): Payer: Self-pay | Admitting: *Deleted

## 2020-06-06 DIAGNOSIS — J45909 Unspecified asthma, uncomplicated: Secondary | ICD-10-CM | POA: Insufficient documentation

## 2020-06-06 DIAGNOSIS — Z91010 Allergy to peanuts: Secondary | ICD-10-CM | POA: Insufficient documentation

## 2020-06-06 DIAGNOSIS — N939 Abnormal uterine and vaginal bleeding, unspecified: Secondary | ICD-10-CM | POA: Diagnosis not present

## 2020-06-06 DIAGNOSIS — R102 Pelvic and perineal pain: Secondary | ICD-10-CM | POA: Diagnosis not present

## 2020-06-06 DIAGNOSIS — Z7901 Long term (current) use of anticoagulants: Secondary | ICD-10-CM | POA: Insufficient documentation

## 2020-06-06 DIAGNOSIS — R1110 Vomiting, unspecified: Secondary | ICD-10-CM | POA: Diagnosis not present

## 2020-06-06 DIAGNOSIS — R52 Pain, unspecified: Secondary | ICD-10-CM

## 2020-06-06 DIAGNOSIS — R1030 Lower abdominal pain, unspecified: Secondary | ICD-10-CM | POA: Diagnosis present

## 2020-06-06 LAB — CBC WITH DIFFERENTIAL/PLATELET
Abs Immature Granulocytes: 0.02 10*3/uL (ref 0.00–0.07)
Basophils Absolute: 0 10*3/uL (ref 0.0–0.1)
Basophils Relative: 0 %
Eosinophils Absolute: 0.2 10*3/uL (ref 0.0–0.5)
Eosinophils Relative: 2 %
HCT: 34.8 % — ABNORMAL LOW (ref 36.0–46.0)
Hemoglobin: 10.7 g/dL — ABNORMAL LOW (ref 12.0–15.0)
Immature Granulocytes: 0 %
Lymphocytes Relative: 38 %
Lymphs Abs: 2.6 10*3/uL (ref 0.7–4.0)
MCH: 22 pg — ABNORMAL LOW (ref 26.0–34.0)
MCHC: 30.7 g/dL (ref 30.0–36.0)
MCV: 71.5 fL — ABNORMAL LOW (ref 80.0–100.0)
Monocytes Absolute: 0.4 10*3/uL (ref 0.1–1.0)
Monocytes Relative: 5 %
Neutro Abs: 3.7 10*3/uL (ref 1.7–7.7)
Neutrophils Relative %: 55 %
Platelets: 440 10*3/uL — ABNORMAL HIGH (ref 150–400)
RBC: 4.87 MIL/uL (ref 3.87–5.11)
RDW: 17.6 % — ABNORMAL HIGH (ref 11.5–15.5)
WBC: 6.9 10*3/uL (ref 4.0–10.5)
nRBC: 0 % (ref 0.0–0.2)

## 2020-06-06 LAB — COMPREHENSIVE METABOLIC PANEL
ALT: 15 U/L (ref 0–44)
AST: 20 U/L (ref 15–41)
Albumin: 4.3 g/dL (ref 3.5–5.0)
Alkaline Phosphatase: 69 U/L (ref 38–126)
Anion gap: 8 (ref 5–15)
BUN: 7 mg/dL (ref 6–20)
CO2: 24 mmol/L (ref 22–32)
Calcium: 9.5 mg/dL (ref 8.9–10.3)
Chloride: 108 mmol/L (ref 98–111)
Creatinine, Ser: 0.79 mg/dL (ref 0.44–1.00)
GFR, Estimated: >60 mL/min (ref >60)
Glucose, Bld: 83 mg/dL (ref 70–99)
Potassium: 3.6 mmol/L (ref 3.5–5.1)
Sodium: 140 mmol/L (ref 135–145)
Total Bilirubin: 0.3 mg/dL (ref 0.3–1.2)
Total Protein: 7.6 g/dL (ref 6.5–8.1)

## 2020-06-06 LAB — I-STAT BETA HCG BLOOD, ED (MC, WL, AP ONLY): I-stat hCG, quantitative: <5 m[IU]/mL (ref <5)

## 2020-06-06 MED ORDER — ONDANSETRON HCL 4 MG/2ML IJ SOLN
4.0000 mg | Freq: Once | INTRAMUSCULAR | Status: AC
  Administered 2020-06-06: 4 mg via INTRAVENOUS
  Filled 2020-06-06: qty 2

## 2020-06-06 MED ORDER — HYDROMORPHONE HCL 1 MG/ML IJ SOLN
0.5000 mg | Freq: Once | INTRAMUSCULAR | Status: AC
Start: 2020-06-06 — End: 2020-06-06
  Administered 2020-06-06: 0.5 mg via INTRAVENOUS
  Filled 2020-06-06: qty 1

## 2020-06-06 MED ORDER — SODIUM CHLORIDE 0.9 % IV BOLUS
1000.0000 mL | Freq: Once | INTRAVENOUS | Status: AC
  Administered 2020-06-06: 1000 mL via INTRAVENOUS

## 2020-06-06 MED ORDER — DIPHENHYDRAMINE HCL 25 MG PO CAPS
25.0000 mg | ORAL_CAPSULE | ORAL | Status: DC | PRN
  Administered 2020-06-06: 25 mg via ORAL
  Filled 2020-06-06: qty 1

## 2020-06-06 MED ORDER — LORAZEPAM 2 MG/ML IJ SOLN
0.5000 mg | Freq: Once | INTRAMUSCULAR | Status: AC | PRN
  Administered 2020-06-06: 0.5 mg via INTRAVENOUS
  Filled 2020-06-06: qty 1

## 2020-06-06 MED ORDER — PROMETHAZINE HCL 25 MG PO TABS: 25.0000 mg | ORAL_TABLET | Freq: Four times a day (QID) | ORAL | 0 refills | Status: DC | PRN

## 2020-06-06 MED ORDER — DIPHENHYDRAMINE HCL 25 MG PO CAPS
25.0000 mg | ORAL_CAPSULE | Freq: Once | ORAL | Status: AC
  Administered 2020-06-06: 25 mg via ORAL
  Filled 2020-06-06: qty 1

## 2020-06-06 MED ORDER — HYDROCODONE-ACETAMINOPHEN 5-325 MG PO TABS: ORAL_TABLET | ORAL | 0 refills | Status: DC

## 2020-06-06 NOTE — Discharge Instructions (Signed)
Please read and follow all provided instructions.  Your diagnoses today include:  1. Pelvic pain   2. Pain   3. Vaginal bleeding     Tests performed today include:  Blood cell counts and platelets - slightly low red blood cells but stable  Kidney and liver function tests  A blood or urine test for pregnancy (women only)  Ultrasound  Vital signs. See below for your results today.   Medications prescribed:   Vicodin (hydrocodone/acetaminophen) - narcotic pain medication  DO NOT drive or perform any activities that require you to be awake and alert because this medicine can make you drowsy. BE VERY CAREFUL not to take multiple medicines containing Tylenol (also called acetaminophen). Doing so can lead to an overdose which can damage your liver and cause liver failure and possibly death.   Phenergan (promethazine) - for nausea and vomiting  Take any prescribed medications only as directed.  Home care instructions:   Follow any educational materials contained in this packet.  Follow-up instructions: Please follow-up with your GYN in the next 2 days for further evaluation of your symptoms as planned.   Return instructions:  SEEK IMMEDIATE MEDICAL ATTENTION IF:  The pain does not go away or becomes severe   A temperature above 101F develops   Repeated vomiting occurs (multiple episodes)   The pain becomes localized to portions of the abdomen. The right side could possibly be appendicitis. In an adult, the left lower portion of the abdomen could be colitis or diverticulitis.   Blood is being passed in stools or vomit (bright red or black tarry stools)   You develop chest pain, difficulty breathing, dizziness or fainting, or become confused, poorly responsive, or inconsolable (young children)  If you have any other emergent concerns regarding your health  Additional Information: Abdominal (belly) pain can be caused by many things. Your caregiver performed an examination  and possibly ordered blood/urine tests and imaging (CT scan, x-rays, ultrasound). Many cases can be observed and treated at home after initial evaluation in the emergency department. Even though you are being discharged home, abdominal pain can be unpredictable. Therefore, you need a repeated exam if your pain does not resolve, returns, or worsens. Most patients with abdominal pain don't have to be admitted to the hospital or have surgery, but serious problems like appendicitis and gallbladder attacks can start out as nonspecific pain. Many abdominal conditions cannot be diagnosed in one visit, so follow-up evaluations are very important.  Your vital signs today were: BP (!) 139/96   Pulse 85   Temp 98.3 F (36.8 C) (Oral)   Resp 18   Ht 5' 2"  (1.575 m)   Wt 108.9 kg   LMP 06/06/2020   SpO2 98%   BMI 43.90 kg/m  If your blood pressure (bp) was elevated above 135/85 this visit, please have this repeated by your doctor within one month. --------------

## 2020-06-06 NOTE — ED Notes (Signed)
US at bedside

## 2020-06-06 NOTE — ED Provider Notes (Signed)
Emergency Medicine Provider Triage Evaluation Note  [name] , a 25 y.o. female  was evaluated in triage.  Pt complains of abd pain.  Review of Systems  Positive: Lower abd pain, vaginal bleeding Negative: Dysuria, vaginal discharge  Physical Exam  BP (!) 143/88 (BP Location: Right Arm)   Pulse 100   Temp 98.3 F (36.8 C) (Oral)   Resp 18   Ht 5' 2"  (1.575 m)   Wt 108.9 kg   LMP 06/06/2020   SpO2 98%   BMI 43.90 kg/m  Gen:   Awake, no distress   Resp:  Normal effort  MSK:   Moves extremities without difficulty  Other:  Tenderness throughout lower abdomen region, no guarding or rebound tenderness  Medical Decision Making  Medically screening exam initiated at 12:09 PM.  Appropriate orders placed.  [name] was informed that the remainder of the evaluation will be completed by another provider, this initial triage assessment does not replace that evaluation, and the importance of remaining in the ED until their evaluation is complete.  Report significant hx of endometriosis, felt the lower abd pain that she is currently experienced is 2/2 endometriosis.  Currently on her menstruation.  Report extensive hx of endometrial related pain.  Hx of PE currently on Eliquis   [name], Hershal Coria 06/06/20 1211    Gareth Morgan, MD 06/08/20 [phone]
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
## 2020-06-06 NOTE — ED Triage Notes (Signed)
Pt complains of lower abdominal pain, vaginal bleeding, emesis. She was going to see gynecologist but began vomiting and was told to be seen in ED.

## 2020-06-07 ENCOUNTER — Other Ambulatory Visit: Payer: Self-pay

## 2020-06-07 ENCOUNTER — Emergency Department (HOSPITAL_COMMUNITY)
Admission: EM | Admit: 2020-06-07 | Discharge: 2020-06-08 | Disposition: A | Discharge status: Home / Self Care | Payer: PPO | Attending: Emergency Medicine | Admitting: Emergency Medicine

## 2020-06-07 ENCOUNTER — Encounter (HOSPITAL_COMMUNITY): Payer: Self-pay | Admitting: Emergency Medicine

## 2020-06-07 DIAGNOSIS — J45909 Unspecified asthma, uncomplicated: Secondary | ICD-10-CM | POA: Diagnosis not present

## 2020-06-07 DIAGNOSIS — Z7901 Long term (current) use of anticoagulants: Secondary | ICD-10-CM | POA: Diagnosis not present

## 2020-06-07 DIAGNOSIS — R102 Pelvic and perineal pain: Secondary | ICD-10-CM | POA: Insufficient documentation

## 2020-06-07 DIAGNOSIS — N939 Abnormal uterine and vaginal bleeding, unspecified: Secondary | ICD-10-CM | POA: Diagnosis not present

## 2020-06-07 DIAGNOSIS — Z91010 Allergy to peanuts: Secondary | ICD-10-CM | POA: Diagnosis not present

## 2020-06-07 DIAGNOSIS — Z7951 Long term (current) use of inhaled steroids: Secondary | ICD-10-CM | POA: Insufficient documentation

## 2020-06-07 LAB — CBC WITH DIFFERENTIAL/PLATELET
Abs Immature Granulocytes: 0.02 10*3/uL (ref 0.00–0.07)
Basophils Absolute: 0 10*3/uL (ref 0.0–0.1)
Basophils Relative: 1 %
Eosinophils Absolute: 0.2 10*3/uL (ref 0.0–0.5)
Eosinophils Relative: 2 %
HCT: 33.6 % — ABNORMAL LOW (ref 36.0–46.0)
Hemoglobin: 10.4 g/dL — ABNORMAL LOW (ref 12.0–15.0)
Immature Granulocytes: 0 %
Lymphocytes Relative: 35 %
Lymphs Abs: 3 10*3/uL (ref 0.7–4.0)
MCH: 22.2 pg — ABNORMAL LOW (ref 26.0–34.0)
MCHC: 31 g/dL (ref 30.0–36.0)
MCV: 71.8 fL — ABNORMAL LOW (ref 80.0–100.0)
Monocytes Absolute: 0.6 10*3/uL (ref 0.1–1.0)
Monocytes Relative: 7 %
Neutro Abs: 4.8 10*3/uL (ref 1.7–7.7)
Neutrophils Relative %: 55 %
Platelets: 394 10*3/uL (ref 150–400)
RBC: 4.68 MIL/uL (ref 3.87–5.11)
RDW: 17.4 % — ABNORMAL HIGH (ref 11.5–15.5)
WBC: 8.7 10*3/uL (ref 4.0–10.5)
nRBC: 0 % (ref 0.0–0.2)

## 2020-06-07 LAB — I-STAT BETA HCG BLOOD, ED (MC, WL, AP ONLY): I-stat hCG, quantitative: <5 m[IU]/mL (ref <5)

## 2020-06-07 LAB — COMPREHENSIVE METABOLIC PANEL
ALT: 19 U/L (ref 0–44)
AST: 36 U/L (ref 15–41)
Albumin: 3.9 g/dL (ref 3.5–5.0)
Alkaline Phosphatase: 67 U/L (ref 38–126)
Anion gap: 7 (ref 5–15)
BUN: <5 mg/dL — ABNORMAL LOW (ref 6–20)
CO2: 24 mmol/L (ref 22–32)
Calcium: 9 mg/dL (ref 8.9–10.3)
Chloride: 107 mmol/L (ref 98–111)
Creatinine, Ser: 0.91 mg/dL (ref 0.44–1.00)
GFR, Estimated: >60 mL/min (ref >60)
Glucose, Bld: 80 mg/dL (ref 70–99)
Potassium: 3.3 mmol/L — ABNORMAL LOW (ref 3.5–5.1)
Sodium: 138 mmol/L (ref 135–145)
Total Bilirubin: 0.3 mg/dL (ref 0.3–1.2)
Total Protein: 6.9 g/dL (ref 6.5–8.1)

## 2020-06-07 MED ORDER — OXYCODONE-ACETAMINOPHEN 5-325 MG PO TABS
1.0000 | ORAL_TABLET | Freq: Once | ORAL | Status: AC
  Administered 2020-06-07: 1 via ORAL
  Filled 2020-06-07: qty 1

## 2020-06-07 NOTE — ED Triage Notes (Signed)
Patient reports vaginal bleeding with clots onset 3 days ago with near syncopal episodes , history of endometriosis /ovarian cyst , seen by her OB/GYN this morning .

## 2020-06-07 NOTE — ED Provider Notes (Signed)
Emergency Medicine Provider Triage Evaluation Note  [name] , a 25 y.o. female  was evaluated in triage.  Pt complains of pelvic pain and vaginal bleeding.  Patient states symptoms have been present for about 2 days.  Consistent with her endometriosis.  She was seen at [facility] ED yesterday, ultrasound was negative and hgb as her baseline.  She saw her OB/GYN today, recommended for surgical evaluation.  This afternoon she had increased bleeding and had 2 episodes of syncope.  As such, she was in ER.  She reports continued severe pain, has not taken anything for it today. She is on eliquis.   Review of Systems  Positive: Vaginal bleeding, pelvic pain Negative: fever  Physical Exam  BP (!) 148/104 (BP Location: Right Arm)   Pulse 96   Temp 98.5 F (36.9 C) (Oral)   Resp 16   LMP 06/06/2020   SpO2 99%  Gen:   Awake, no distress   Resp:  Normal effort MSK:   Moves extremities without difficulty Other:  Diffuse ttp of the abdomen, worse in the lower abdomen and worse on the R side  Medical Decision Making  Medically screening exam initiated at 7:26 PM.  Appropriate orders placed.  [name] was informed that the remainder of the evaluation will be completed by another provider, this initial triage assessment does not replace that evaluation, and the importance of remaining in the ED until their evaluation is complete.  Labs ordered   Franchot Heidelberg, PA-C 06/07/20 1936    Elnora Morrison, MD 06/08/20 [phone]

## 2020-06-08 ENCOUNTER — Emergency Department (HOSPITAL_COMMUNITY): Payer: PPO

## 2020-06-08 LAB — HEMOGLOBIN AND HEMATOCRIT, BLOOD
HCT: 33.2 % — ABNORMAL LOW (ref 36.0–46.0)
Hemoglobin: 10.4 g/dL — ABNORMAL LOW (ref 12.0–15.0)

## 2020-06-08 MED ORDER — FENTANYL CITRATE (PF) 100 MCG/2ML IJ SOLN
50.0000 ug | Freq: Once | INTRAMUSCULAR | Status: AC
Start: 2020-06-08 — End: 2020-06-08
  Administered 2020-06-08: 50 ug via INTRAVENOUS
  Filled 2020-06-08: qty 2

## 2020-06-08 MED ORDER — ONDANSETRON HCL 4 MG/2ML IJ SOLN: 4.0000 mg | Freq: Once | INTRAMUSCULAR | Status: DC

## 2020-06-08 MED ORDER — ONDANSETRON HCL 4 MG/2ML IJ SOLN
4.0000 mg | Freq: Once | INTRAMUSCULAR | Status: AC
  Administered 2020-06-08: 4 mg via INTRAVENOUS
  Filled 2020-06-08: qty 2

## 2020-06-08 MED ORDER — SODIUM CHLORIDE 0.9 % IV SOLN: 12.5000 mg | Freq: Once | INTRAVENOUS | Status: DC

## 2020-06-08 MED ORDER — MORPHINE SULFATE (PF) 4 MG/ML IV SOLN: 4.0000 mg | Freq: Once | INTRAVENOUS | Status: DC

## 2020-06-08 MED ORDER — PROMETHAZINE HCL 25 MG PO TABS: 25.0000 mg | ORAL_TABLET | Freq: Three times a day (TID) | ORAL | 0 refills | Status: DC | PRN

## 2020-06-08 MED ORDER — FENTANYL CITRATE (PF) 100 MCG/2ML IJ SOLN
50.0000 ug | Freq: Once | INTRAMUSCULAR | Status: AC
  Administered 2020-06-08: 50 ug via INTRAVENOUS
  Filled 2020-06-08: qty 2

## 2020-06-08 MED ORDER — LORAZEPAM 2 MG/ML IJ SOLN
1.0000 mg | Freq: Once | INTRAMUSCULAR | Status: AC
  Administered 2020-06-08: 1 mg via INTRAVENOUS
  Filled 2020-06-08: qty 1

## 2020-06-08 MED ORDER — OXYCODONE HCL 5 MG PO TABS: 5.0000 mg | ORAL_TABLET | Freq: Four times a day (QID) | ORAL | 0 refills | Status: DC | PRN

## 2020-06-08 NOTE — ED Provider Notes (Signed)
[name] EMERGENCY DEPARTMENT Provider Note   CSN: [id number] Arrival date & time: 06/07/20  1912     History Chief Complaint  Patient presents with  . Vaginal Bleeding    [name] is a 25 y.o. female.  Patient with vaginal bleeding.  Ongoing for the last several days intermittent.  Passing large blood clots.  No active bleeding at this time.  Having some mild right lower pelvic pain.  Has difficulty getting ultrasounds and examinations from PTSD.  Was here 2 days ago for the same and did not stay for ultrasound due to discomfort.  She saw her OB/GYN today who thinks that she might have endometriosis or cyst.  She came today because the bleeding seemed heavy.  The history is provided by the patient.  Vaginal Bleeding Quality:  Clots Severity:  Mild Onset quality:  Gradual Timing:  Intermittent Progression:  Waxing and waning Chronicity:  Recurrent Context: spontaneously   Relieved by:  Nothing Worsened by:  Nothing Associated symptoms: no abdominal pain, no back pain, no dysuria and no fever   Risk factors: ovarian cysts        Past Medical History:  Diagnosis Date  . Asthma   . Celiac disease   . Collagen vascular disease ([place])   . Diarrhea    Patient mentions diagnosis of ulcerative colitis but it is not clear she actually has UC  . Endometriosis   . Lupus ([place])   . Ovarian cyst   . Panic disorder 05/30/2020  . Pulmonary embolus ([place]) 02/06/2020    Patient Active Problem List   Diagnosis Date Noted  . 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 Panic disorder 05/30/2020  . Major depressive disorder 05/29/2020  . Suicide attempt by acetaminophen overdose ([place]) 05/29/2020  . Obesity, Class III, BMI 40-49.9 (morbid obesity) ([place]) 04/05/2020  . SOB (shortness of breath) 04/05/2020  . Abdominal pain 04/04/2020  . SLE (systemic lupus erythematosus related syndrome) ([place]) 04/04/2020  .  Shortness of breath 04/04/2020  . Pneumonia 03/23/2020  . Chronic nausea 03/23/2020  . Asthma 03/22/2020  . Dysuria 02/03/2020  . Hematuria 02/03/2020  . Mass of urinary bladder 01/26/2020  . Allergic rhinitis 05/05/2019  . 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 Endometriosis 03/14/2018  . Long-term use of high-risk medication 07/02/2017  . Lupus anticoagulant positive 07/02/2017  . Personal history of pulmonary embolism 07/02/2017  . Other forms of systemic lupus erythematosus ([place]) 03/12/2017  . Anemia 02/28/2017  . Migraine 02/28/2017  . Dysmenorrhea 08/28/2011  . Gastritis 08/28/2011    Past Surgical History:  Procedure Laterality Date  . APPENDECTOMY    . CHOLECYSTECTOMY    . EXCISION OF ENDOMETRIOMA     Ovarian cyst removal  . FOOT FRACTURE SURGERY     w/ hardware  . HERNIA REPAIR    . TONSILLECTOMY       OB History   No obstetric history on file.     Family History  Problem Relation Age of Onset  . Hypertension Mother   . Hypercholesterolemia Mother   . Rheum arthritis Mother   . Diabetes Father   . Hypertension Father   . Cancer Father   . Autism Brother     Social History   Tobacco Use  . Smoking status: Never Smoker  . Smokeless tobacco: Never Used  Vaping Use  . Vaping Use: Never used  Substance Use Topics  . Alcohol use: Never  . Drug use: Never  Home Medications Prior to Admission medications   Medication Sig Start Date End Date Taking? Authorizing Provider  oxyCODONE (ROXICODONE) 5 MG immediate release tablet Take 1 tablet (5 mg total) by mouth every 6 (six) hours as needed for up to 15 doses for severe pain. 06/08/20  Yes Ashawna Hanback, DO  promethazine (PHENERGAN) 25 MG tablet Take 1 tablet (25 mg total) by mouth every 8 (eight) hours as needed for up to 20 doses for nausea or vomiting. 06/08/20  Yes Aralynn Brake, DO  albuterol (PROVENTIL) (2.5 MG/3ML) 0.083% nebulizer solution Take 3 mLs (2.5 mg total) by nebulization every 6 (six)  hours as needed for wheezing or shortness of breath. 03/19/20   Volney American, PA-C  apixaban (ELIQUIS) 5 MG TABS tablet Take 5 mg by mouth 2 (two) times daily.    [provider]  arformoterol (BROVANA) 15 MCG/2ML NEBU Take 2 mLs (15 mcg total) by nebulization 2 (two) times daily. 04/06/20   Oswald Hillock, MD  budesonide (PULMICORT) 0.5 MG/2ML nebulizer solution Take 2 mLs (0.5 mg total) by nebulization 2 (two) times daily. 04/06/20   Oswald Hillock, MD  EPINEPHrine 0.3 mg/0.3 mL IJ SOAJ injection Inject 0.3 mg into the muscle daily as needed (Allergic Reaction). 06/01/20   Lindell Spar I, NP  fluticasone (FLONASE) 50 MCG/ACT nasal spray Place 1 spray into both nostrils daily as needed for allergies or rhinitis.    [provider]  HYDROcodone-acetaminophen (NORCO/VICODIN) 5-325 MG tablet Take 1 tablet every 6 hours as needed for severe pain 06/06/20   Carlisle Cater, PA-C  hydroxychloroquine (PLAQUENIL) 200 MG tablet Take 2 tablets (400 mg total) by mouth daily. Patient taking differently: Take 400 mg by mouth at bedtime. 03/23/20   Collier Salina, MD  hydrOXYzine (ATARAX/VISTARIL) 25 MG tablet Take 1 tablet (25 mg total) by mouth every 6 (six) hours as needed (anxiety). 06/01/20   Lindell Spar I, NP  lamoTRIgine (LAMICTAL) 25 MG tablet Take 1 tablet (25 mg total) by mouth daily. For mood stabilization 06/02/20   Lindell Spar I, NP  montelukast (SINGULAIR) 10 MG tablet Take 1 tablet (10 mg total) by mouth at bedtime. Patient not taking: Reported on 05/28/2020 05/11/20   Wieters, Madelynn Done C, PA-C  Norethindrone-Ethinyl Estradiol-Fe Biphas (LO LOESTRIN FE) 1 MG-10 MCG / 10 MCG tablet Take 1 tablet by mouth daily. 05/11/20 08/09/20  Wieters, Hallie C, PA-C  promethazine (PHENERGAN) 25 MG tablet Take 1 tablet (25 mg total) by mouth every 6 (six) hours as needed for nausea or vomiting. 06/06/20   Carlisle Cater, PA-C  revefenacin (YUPELRI) 175 MCG/3ML nebulizer solution Take 3 mLs (175 mcg  total) by nebulization daily. 04/07/20   Oswald Hillock, MD  sertraline (ZOLOFT) 100 MG tablet Take 2 tablets (200 mg total) by mouth at bedtime. For depression 06/01/20   Lindell Spar I, NP  umeclidinium bromide (INCRUSE ELLIPTA) 62.5 MCG/INH AEPB Inhale 1 puff into the lungs daily. For breathing problems. 06/01/20   Lindell Spar I, NP  zolpidem (AMBIEN) 5 MG tablet Take 1 tablet (5 mg total) by mouth at bedtime as needed for sleep. 06/01/20   Lindell Spar I, NP  dicyclomine (BENTYL) 20 MG tablet Take 1 tablet (20 mg total) by mouth 2 (two) times daily. 11/29/18 04/11/19  Muthersbaugh, Jarrett Soho, PA-C  Fluticasone Propionate HFA (FLOVENT HFA IN) Inhale into the lungs. Patient not taking: Reported on 11/14/2019  11/14/19  [provider]    Allergies    Azithromycin, Peanut-containing  drug products, Iodinated diagnostic agents, Other, Amoxicillin, Fish allergy, Gluten meal, Hydromet [hydrocodone bit-homatrop mbr], Ibuprofen, Lactose intolerance (gi), and Pantoprazole  Review of Systems   Review of Systems  Constitutional: Negative for chills and fever.  HENT: Negative for ear pain and sore throat.   Eyes: Negative for pain and visual disturbance.  Respiratory: Negative for cough and shortness of breath.   Cardiovascular: Negative for chest pain and palpitations.  Gastrointestinal: Negative for abdominal pain and vomiting.  Genitourinary: Positive for vaginal bleeding. Negative for dysuria and hematuria.  Musculoskeletal: Negative for arthralgias and back pain.  Skin: Negative for color change and rash.  Neurological: Negative for seizures and syncope.  All other systems reviewed and are negative.   Physical Exam Updated Vital Signs  ED Triage Vitals  Enc Vitals Group     BP 06/07/20 1918 (!) 148/104     Pulse Rate 06/07/20 1918 96     Resp 06/07/20 1918 16     Temp 06/07/20 1918 98.5 F (36.9 C)     Temp Source 06/07/20 1918 Oral     SpO2 06/07/20 1918 99 %     Weight --       Height --      Head Circumference --      Peak Flow --      Pain Score 06/07/20 1916 8     Pain Loc --      Pain Edu? --      Excl. in [place]? --     Physical Exam Vitals and nursing note reviewed.  Constitutional:      General: She is not in acute distress.    Appearance: She is well-developed.  HENT:     Head: Normocephalic and atraumatic.  Eyes:     Conjunctiva/sclera: Conjunctivae normal.  Cardiovascular:     Rate and Rhythm: Normal rate and regular rhythm.     Heart sounds: No murmur heard.   Pulmonary:     Effort: Pulmonary effort is normal. No respiratory distress.     Breath sounds: Normal breath sounds.  Abdominal:     Palpations: Abdomen is soft.     Tenderness: There is no abdominal tenderness.  Musculoskeletal:     Cervical back: Neck supple.  Skin:    General: Skin is warm and dry.  Neurological:     Mental Status: She is alert.     ED Results / Procedures / Treatments   Labs (all labs ordered are listed, but only abnormal results are displayed) Labs Reviewed  CBC WITH DIFFERENTIAL/PLATELET - Abnormal; Notable for the following components:      Result Value   Hemoglobin 10.4 (*)    HCT 33.6 (*)    MCV 71.8 (*)    MCH 22.2 (*)    RDW 17.4 (*)    All other components within normal limits  COMPREHENSIVE METABOLIC PANEL - Abnormal; Notable for the following components:   Potassium 3.3 (*)    BUN <5 (*)    All other components within normal limits  HEMOGLOBIN AND HEMATOCRIT, BLOOD - Abnormal; Notable for the following components:   Hemoglobin 10.4 (*)    HCT 33.2 (*)    All other components within normal limits  I-STAT BETA HCG BLOOD, ED ([facility], [facility], [facility] ONLY)    EKG None  Radiology US PELVIC COMPLETE WITH TRANSVAGINAL  Result Date: 06/06/2020 CLINICAL DATA:  Pelvic pain, history of endometriosis EXAM: TRANSABDOMINAL ULTRASOUND OF PELVIS TECHNIQUE: Transabdominal ultrasound examination of the pelvis was performed  including evaluation of the uterus,  ovaries, adnexal regions, and pelvic cul-de-sac. COMPARISON:  January 02, 2000 FINDINGS: Uterus Measurements: 7.0 x 3.6 x 3.1 cm = volume: 41 mL. Anteverted without fibroids or other mass visualized. Endometrium Thickness: 2.  No focal abnormality visualized. Right ovary Not visualized Left ovary Not visualized Other findings:  No abnormal free fluid. Patient was unable to tolerate the transvaginal portion of the exam. IMPRESSION: Patient was unable to tolerate the transvaginal portion of the exam within this context: Normal appearing uterus and ovaries not visualized. Electronically Signed   By: Dahlia Bailiff MD   On: 06/06/2020 19:07    Procedures Procedures   Medications Ordered in ED Medications  oxyCODONE-acetaminophen (PERCOCET/ROXICET) 5-325 MG per tablet 1 tablet (1 tablet Oral Given 06/07/20 1929)  ondansetron (ZOFRAN) injection 4 mg (4 mg Intravenous Given 06/08/20 0231)  fentaNYL (SUBLIMAZE) injection 50 mcg (50 mcg Intravenous Given 06/08/20 0231)  LORazepam (ATIVAN) injection 1 mg (1 mg Intravenous Given 06/08/20 0555)  fentaNYL (SUBLIMAZE) injection 50 mcg (50 mcg Intravenous Given 06/08/20 0546)  ondansetron (ZOFRAN) injection 4 mg (4 mg Intravenous Given 06/08/20 0545)    ED Course  I have reviewed the triage vital signs and the nursing notes.  Pertinent labs & imaging results that were available during my care of the patient were reviewed by me and considered in my medical decision making (see chart for details).    MDM Rules/Calculators/A&P                          [name] is here for vaginal bleeding.  History of the same.  Ongoing for the last several days.  Had a unremarkable CT scan of her abdomen about 10 days ago.  She is on birth control.  She saw her OB/GYN today who thinks that she likely has endometriosis.  She has history of cyst.  Hemoglobin today is stable and unchanged from 2 days ago.  She has no active bleeding on exam.  She has history of PTSD and  does not tolerate pelvic exams or pelvic ultrasounds very well but she is amenable to trying some Ativan have an ultrasound done today to further evaluate.  I have a very low suspicion for torsion.  She may have a cyst.  Pregnancy test is negative.  Lab work is reassuring.  Vital signs are reassuring.  She has no abdominal tenderness on exam.  She does not appear to be in any discomfort.  She has already gotten fentanyl and Zofran prior to my evaluation.  We will try Ativan and ultrasound again today to see if we can find any source for her bleeding and pain but overall suspect she will need to continue to work with OB/GYN.    Patient was unable to tolerate ultrasound.  Overall CT scan about a week or so ago showed no large cyst.  Clinically she does not appear to have symptoms suggestive of torsion.  Overall patient with abnormal uterine bleeding with a history of the same.  Stable hemoglobin.  We will write her for pain and nausea medicine.  Recommend follow-up with OB/GYN.  Discharged in good condition.  This chart was dictated using voice recognition software.  Despite best efforts to proofread,  errors can occur which can change the documentation meaning.    Final Clinical Impression(s) / ED Diagnoses Final diagnoses:  Vaginal bleeding    Rx / DC Orders ED Discharge Orders  Ordered    oxyCODONE (ROXICODONE) 5 MG immediate release tablet  Every 6 hours PRN        06/08/20 0634    promethazine (PHENERGAN) 25 MG tablet  Every 8 hours PRN        06/08/20 [place], [place], DO 06/08/20 [phone]

## 2020-06-08 NOTE — Discharge Instructions (Signed)
Follow up with her OB/GYN

## 2020-06-08 NOTE — ED Provider Notes (Signed)
Patient reported to nursing staff concern for multiple episodes of clot passage episodes in the waiting room, hgb/hct similar to prior on chart review from labs drawn around 8PM today, will repeat hgb/hct at this time.    Cassandra Dyke, PA-C 06/08/20 0103    Cassandra Sites, DO 06/08/20 [id number]

## 2020-06-10 ENCOUNTER — Other Ambulatory Visit: Payer: Self-pay

## 2020-06-10 ENCOUNTER — Emergency Department (HOSPITAL_COMMUNITY)
Admission: EM | Admit: 2020-06-10 | Discharge: 2020-06-11 | Disposition: A | Discharge status: Home / Self Care | Payer: PPO | Attending: Emergency Medicine | Admitting: Emergency Medicine

## 2020-06-10 ENCOUNTER — Other Ambulatory Visit: Payer: Self-pay | Admitting: Physician Assistant

## 2020-06-10 ENCOUNTER — Encounter (HOSPITAL_COMMUNITY): Payer: Self-pay

## 2020-06-10 DIAGNOSIS — J45909 Unspecified asthma, uncomplicated: Secondary | ICD-10-CM | POA: Diagnosis not present

## 2020-06-10 DIAGNOSIS — R1110 Vomiting, unspecified: Secondary | ICD-10-CM | POA: Insufficient documentation

## 2020-06-10 DIAGNOSIS — Z7951 Long term (current) use of inhaled steroids: Secondary | ICD-10-CM | POA: Diagnosis not present

## 2020-06-10 DIAGNOSIS — Z7901 Long term (current) use of anticoagulants: Secondary | ICD-10-CM | POA: Insufficient documentation

## 2020-06-10 DIAGNOSIS — R102 Pelvic and perineal pain: Secondary | ICD-10-CM

## 2020-06-10 DIAGNOSIS — G8929 Other chronic pain: Secondary | ICD-10-CM

## 2020-06-10 DIAGNOSIS — R109 Unspecified abdominal pain: Secondary | ICD-10-CM | POA: Diagnosis not present

## 2020-06-10 DIAGNOSIS — R030 Elevated blood-pressure reading, without diagnosis of hypertension: Secondary | ICD-10-CM

## 2020-06-10 DIAGNOSIS — Z8742 Personal history of other diseases of the female genital tract: Secondary | ICD-10-CM

## 2020-06-10 DIAGNOSIS — N809 Endometriosis, unspecified: Secondary | ICD-10-CM

## 2020-06-10 DIAGNOSIS — Z91010 Allergy to peanuts: Secondary | ICD-10-CM | POA: Insufficient documentation

## 2020-06-10 DIAGNOSIS — N926 Irregular menstruation, unspecified: Secondary | ICD-10-CM

## 2020-06-10 DIAGNOSIS — N946 Dysmenorrhea, unspecified: Secondary | ICD-10-CM

## 2020-06-10 LAB — URINALYSIS, ROUTINE W REFLEX MICROSCOPIC
Bacteria, UA: NONE SEEN
Bilirubin Urine: NEGATIVE
Glucose, UA: NEGATIVE mg/dL
Ketones, ur: NEGATIVE mg/dL
Leukocytes,Ua: NEGATIVE
Nitrite: NEGATIVE
Protein, ur: NEGATIVE mg/dL
Specific Gravity, Urine: >=1.03 (ref 1.005–1.030)
pH: 6 (ref 5.0–8.0)

## 2020-06-10 LAB — I-STAT BETA HCG BLOOD, ED (MC, WL, AP ONLY): I-stat hCG, quantitative: <5 m[IU]/mL (ref <5)

## 2020-06-10 LAB — COMPREHENSIVE METABOLIC PANEL
ALT: 19 U/L (ref 0–44)
AST: 25 U/L (ref 15–41)
Albumin: 3.9 g/dL (ref 3.5–5.0)
Alkaline Phosphatase: 69 U/L (ref 38–126)
Anion gap: 11 (ref 5–15)
BUN: 7 mg/dL (ref 6–20)
CO2: 20 mmol/L — ABNORMAL LOW (ref 22–32)
Calcium: 9 mg/dL (ref 8.9–10.3)
Chloride: 109 mmol/L (ref 98–111)
Creatinine, Ser: 0.75 mg/dL (ref 0.44–1.00)
GFR, Estimated: >60 mL/min (ref >60)
Glucose, Bld: 146 mg/dL — ABNORMAL HIGH (ref 70–99)
Potassium: 3.3 mmol/L — ABNORMAL LOW (ref 3.5–5.1)
Sodium: 140 mmol/L (ref 135–145)
Total Bilirubin: 0.5 mg/dL (ref 0.3–1.2)
Total Protein: 7.5 g/dL (ref 6.5–8.1)

## 2020-06-10 LAB — CBC
HCT: 34.3 % — ABNORMAL LOW (ref 36.0–46.0)
Hemoglobin: 10.6 g/dL — ABNORMAL LOW (ref 12.0–15.0)
MCH: 22.1 pg — ABNORMAL LOW (ref 26.0–34.0)
MCHC: 30.9 g/dL (ref 30.0–36.0)
MCV: 71.5 fL — ABNORMAL LOW (ref 80.0–100.0)
Platelets: 345 10*3/uL (ref 150–400)
RBC: 4.8 MIL/uL (ref 3.87–5.11)
RDW: 18.1 % — ABNORMAL HIGH (ref 11.5–15.5)
WBC: 7.3 10*3/uL (ref 4.0–10.5)
nRBC: 0 % (ref 0.0–0.2)

## 2020-06-10 LAB — LIPASE, BLOOD: Lipase: 37 U/L (ref 11–51)

## 2020-06-10 MED ORDER — HYDROMORPHONE HCL 1 MG/ML IJ SOLN
1.0000 mg | Freq: Once | INTRAMUSCULAR | Status: AC
  Administered 2020-06-10: 1 mg via INTRAVENOUS
  Filled 2020-06-10: qty 1

## 2020-06-10 MED ORDER — ONDANSETRON HCL 4 MG/2ML IJ SOLN
4.0000 mg | Freq: Once | INTRAMUSCULAR | Status: AC
  Administered 2020-06-10: 4 mg via INTRAVENOUS
  Filled 2020-06-10: qty 2

## 2020-06-10 MED ORDER — HYDROMORPHONE HCL 1 MG/ML IJ SOLN
1.0000 mg | Freq: Once | INTRAMUSCULAR | Status: AC
  Administered 2020-06-11: 1 mg via INTRAVENOUS
  Filled 2020-06-10: qty 1

## 2020-06-10 MED ORDER — SODIUM CHLORIDE 0.9 % IV BOLUS
1000.0000 mL | Freq: Once | INTRAVENOUS | Status: AC
  Administered 2020-06-10: 1000 mL via INTRAVENOUS

## 2020-06-10 MED ORDER — DIPHENHYDRAMINE HCL 50 MG/ML IJ SOLN
25.0000 mg | Freq: Once | INTRAMUSCULAR | Status: AC
  Administered 2020-06-10: 25 mg via INTRAVENOUS
  Filled 2020-06-10: qty 1

## 2020-06-10 MED ORDER — POTASSIUM CHLORIDE CRYS ER 20 MEQ PO TBCR
40.0000 meq | EXTENDED_RELEASE_TABLET | Freq: Once | ORAL | Status: AC
  Administered 2020-06-10: 40 meq via ORAL
  Filled 2020-06-10: qty 2

## 2020-06-10 NOTE — ED Triage Notes (Signed)
Patient c/o mid abdominal pain, N/V x 5 days and states she has been diagnosed with endometriosis.  Patient was seen at [facility] ED 2 days ago for vaginal bleeding with clots x 5 days.  Patient reports that she is currently taking Eliquis.

## 2020-06-10 NOTE — ED Notes (Signed)
Pt refused vitals because she has been waiting a long time and said "they clearly do not look at them because they are stroke level high so it's no point."  This writer told pt we are working to get her back but at the moment we do not have a room for her and if she stays we will treat her but she said if she is not the next person called back she is going to leave.

## 2020-06-10 NOTE — ED Provider Notes (Signed)
[facility] DEPT Provider Note   CSN: [id number] Arrival date & time: 06/10/20  1552     History Chief Complaint  Patient presents with  . Abdominal Pain  . Emesis    [name] is a 25 y.o. female.  Patient indicates hx chronic abdominal/pelvic pain, endometriosis, adhesions, and that she is having persistent pain from same. Symptoms gradual onset in past several weeks, constant, dull, moderate-severe. Multiple prior EDs for same and saw GYN 1-2x this week including yesterday. States current plan is for outpatient open MRI and then scheduling of laparoscopy. States as persistent pain and nv today, came to ED tonight. Emesis clear, not bloody or bilious. Occasional loose stools, no bloody bms. No current vaginal bleeding/discharge. No fever or chills. No dysuria.   The history is provided by the patient.  Abdominal Pain Associated symptoms: vomiting   Associated symptoms: no chest pain, no chills, no dysuria, no fever, no shortness of breath and no sore throat   Emesis Associated symptoms: abdominal pain   Associated symptoms: no chills, no fever, no headaches and no sore throat        Past Medical History:  Diagnosis Date  . Asthma   . Celiac disease   . Collagen vascular disease ([place])   . Diarrhea    Patient mentions diagnosis of ulcerative colitis but it is not clear she actually has UC  . Endometriosis   . Lupus ([place])   . Ovarian cyst   . Panic disorder 05/30/2020  . Pulmonary embolus ([place]) 02/06/2020    Patient Active Problem List   Diagnosis Date Noted  . 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 Panic disorder 05/30/2020  . Major depressive disorder 05/29/2020  . Suicide attempt by acetaminophen overdose ([place]) 05/29/2020  . Obesity, Class III, BMI 40-49.9 (morbid obesity) ([place]) 04/05/2020  . SOB (shortness of breath) 04/05/2020  . Abdominal pain 04/04/2020  . SLE (systemic  lupus erythematosus related syndrome) ([place]) 04/04/2020  . Shortness of breath 04/04/2020  . Pneumonia 03/23/2020  . Chronic nausea 03/23/2020  . Asthma 03/22/2020  . Dysuria 02/03/2020  . Hematuria 02/03/2020  . Mass of urinary bladder 01/26/2020  . Allergic rhinitis 05/05/2019  . 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 Endometriosis 03/14/2018  . Long-term use of high-risk medication 07/02/2017  . Lupus anticoagulant positive 07/02/2017  . Personal history of pulmonary embolism 07/02/2017  . Other forms of systemic lupus erythematosus ([place]) 03/12/2017  . Anemia 02/28/2017  . Migraine 02/28/2017  . Dysmenorrhea 08/28/2011  . Gastritis 08/28/2011    Past Surgical History:  Procedure Laterality Date  . APPENDECTOMY    . CHOLECYSTECTOMY    . EXCISION OF ENDOMETRIOMA     Ovarian cyst removal  . excision of endometriosis    . FOOT FRACTURE SURGERY     w/ hardware  . HERNIA REPAIR    . TONSILLECTOMY       OB History   No obstetric history on file.     Family History  Problem Relation Age of Onset  . Hypertension Mother   . Hypercholesterolemia Mother   . Rheum arthritis Mother   . Diabetes Father   . Hypertension Father   . Cancer Father   . Autism Brother     Social History   Tobacco Use  . Smoking status: Never Smoker  . Smokeless tobacco: Never Used  Vaping Use  . Vaping Use: Never used  Substance Use Topics  . Alcohol use:  Never  . Drug use: Never    Home Medications Prior to Admission medications   Medication Sig Start Date End Date Taking? Authorizing Provider  albuterol (PROVENTIL) (2.5 MG/3ML) 0.083% nebulizer solution Take 3 mLs (2.5 mg total) by nebulization every 6 (six) hours as needed for wheezing or shortness of breath. 03/19/20   Volney American, PA-C  apixaban (ELIQUIS) 5 MG TABS tablet Take 5 mg by mouth 2 (two) times daily.    [provider]  arformoterol (BROVANA) 15 MCG/2ML NEBU Take 2 mLs (15 mcg total) by nebulization 2  (two) times daily. 04/06/20   Oswald Hillock, MD  budesonide (PULMICORT) 0.5 MG/2ML nebulizer solution Take 2 mLs (0.5 mg total) by nebulization 2 (two) times daily. 04/06/20   Oswald Hillock, MD  EPINEPHrine 0.3 mg/0.3 mL IJ SOAJ injection Inject 0.3 mg into the muscle daily as needed (Allergic Reaction). 06/01/20   Lindell Spar I, NP  fluticasone (FLONASE) 50 MCG/ACT nasal spray Place 1 spray into both nostrils daily as needed for allergies or rhinitis.    [provider]  HYDROcodone-acetaminophen (NORCO/VICODIN) 5-325 MG tablet Take 1 tablet every 6 hours as needed for severe pain 06/06/20   Carlisle Cater, PA-C  hydroxychloroquine (PLAQUENIL) 200 MG tablet Take 2 tablets (400 mg total) by mouth daily. Patient taking differently: Take 400 mg by mouth at bedtime. 03/23/20   Collier [place]ina, MD  hydrOXYzine (ATARAX/VISTARIL) 25 MG tablet Take 1 tablet (25 mg total) by mouth every 6 (six) hours as needed (anxiety). 06/01/20   Lindell Spar I, NP  lamoTRIgine (LAMICTAL) 25 MG tablet Take 1 tablet (25 mg total) by mouth daily. For mood stabilization 06/02/20   Lindell Spar I, NP  montelukast (SINGULAIR) 10 MG tablet Take 1 tablet (10 mg total) by mouth at bedtime. Patient not taking: Reported on 05/28/2020 05/11/20   Wieters, Madelynn Done C, PA-C  Norethindrone-Ethinyl Estradiol-Fe Biphas (LO LOESTRIN FE) 1 MG-10 MCG / 10 MCG tablet Take 1 tablet by mouth daily. 05/11/20 08/09/20  Wieters, Hallie C, PA-C  oxyCODONE (ROXICODONE) 5 MG immediate release tablet Take 1 tablet (5 mg total) by mouth every 6 (six) hours as needed for up to 15 doses for severe pain. 06/08/20   Curatolo, Adam, DO  promethazine (PHENERGAN) 25 MG tablet Take 1 tablet (25 mg total) by mouth every 6 (six) hours as needed for nausea or vomiting. 06/06/20   Carlisle Cater, PA-C  promethazine (PHENERGAN) 25 MG tablet Take 1 tablet (25 mg total) by mouth every 8 (eight) hours as needed for up to 20 doses for nausea or vomiting. 06/08/20   Curatolo,  Adam, DO  revefenacin (YUPELRI) 175 MCG/3ML nebulizer solution Take 3 mLs (175 mcg total) by nebulization daily. 04/07/20   Oswald Hillock, MD  sertraline (ZOLOFT) 100 MG tablet Take 2 tablets (200 mg total) by mouth at bedtime. For depression 06/01/20   Lindell Spar I, NP  umeclidinium bromide (INCRUSE ELLIPTA) 62.5 MCG/INH AEPB Inhale 1 puff into the lungs daily. For breathing problems. 06/01/20   Lindell Spar I, NP  zolpidem (AMBIEN) 5 MG tablet Take 1 tablet (5 mg total) by mouth at bedtime as needed for sleep. 06/01/20   Lindell Spar I, NP  dicyclomine (BENTYL) 20 MG tablet Take 1 tablet (20 mg total) by mouth 2 (two) times daily. 11/29/18 04/11/19  Muthersbaugh, Jarrett Soho, PA-C  Fluticasone Propionate HFA (FLOVENT HFA IN) Inhale into the lungs. Patient not taking: Reported on 11/14/2019  11/14/19  [provider]  Allergies    Azithromycin, Peanut-containing drug products, Iodinated diagnostic agents, Other, Amoxicillin, Fish allergy, Gluten meal, Hydromet [hydrocodone bit-homatrop mbr], Ibuprofen, Lactose intolerance (gi), and Pantoprazole  Review of Systems   Review of Systems  Constitutional: Negative for chills and fever.  HENT: Negative for sore throat.   Eyes: Negative for redness.  Respiratory: Negative for shortness of breath.   Cardiovascular: Negative for chest pain.  Gastrointestinal: Positive for abdominal pain and vomiting.  Genitourinary: Negative for dysuria.  Musculoskeletal: Negative for back pain.  Skin: Negative for rash.  Neurological: Negative for headaches.  Hematological:       On eliquis  Psychiatric/Behavioral: Negative for confusion.    Physical Exam Updated Vital Signs BP (!) 140/92   Pulse (!) 106   Temp 98.2 F (36.8 C) (Oral)   Resp 16   Ht 1.575 m (5' 2" )   Wt 115.6 kg   LMP 06/06/2020   SpO2 99%   BMI 46.62 kg/m   Physical Exam Vitals and nursing note reviewed.  Constitutional:      Appearance: Normal appearance. She is  well-developed.  HENT:     Head: Atraumatic.     Nose: Nose normal.     Mouth/Throat:     Mouth: Mucous membranes are moist.  Eyes:     General: No scleral icterus.    Conjunctiva/sclera: Conjunctivae normal.  Neck:     Trachea: No tracheal deviation.  Cardiovascular:     Rate and Rhythm: Normal rate and regular rhythm.     Pulses: Normal pulses.     Heart sounds: Normal heart sounds. No murmur heard. No friction rub. No gallop.   Pulmonary:     Effort: Pulmonary effort is normal. No respiratory distress.     Breath sounds: Normal breath sounds.  Abdominal:     General: Bowel sounds are normal. There is no distension.     Palpations: Abdomen is soft. There is no mass.     Tenderness: There is no abdominal tenderness. There is no guarding.  Genitourinary:    Comments: No cva tenderness.  Musculoskeletal:        General: No swelling.     Cervical back: Neck supple. No muscular tenderness.  Skin:    General: Skin is warm and dry.     Findings: No rash.  Neurological:     Mental Status: She is alert.     Comments: Alert, speech normal.   Psychiatric:     Comments: Mildly anxious.      ED Results / Procedures / Treatments   Labs (all labs ordered are listed, but only abnormal results are displayed) Results for orders placed or performed during the hospital encounter of 06/10/20  Lipase, blood  Result Value Ref Range   Lipase 37 11 - 51 U/L  Comprehensive metabolic panel  Result Value Ref Range   Sodium 140 135 - 145 mmol/L   Potassium 3.3 (L) 3.5 - 5.1 mmol/L   Chloride 109 98 - 111 mmol/L   CO2 20 (L) 22 - 32 mmol/L   Glucose, Bld 146 (H) 70 - 99 mg/dL   BUN 7 6 - 20 mg/dL   Creatinine, Ser 0.75 0.44 - 1.00 mg/dL   Calcium 9.0 8.9 - 10.3 mg/dL   Total Protein 7.5 6.5 - 8.1 g/dL   Albumin 3.9 3.5 - 5.0 g/dL   AST 25 15 - 41 U/L   ALT 19 0 - 44 U/L   Alkaline Phosphatase 69 38 - 126 U/L  Total Bilirubin 0.5 0.3 - 1.2 mg/dL   GFR, Estimated >60 >60 mL/min    Anion gap 11 5 - 15  CBC  Result Value Ref Range   WBC 7.3 4.0 - 10.5 K/uL   RBC 4.80 3.87 - 5.11 MIL/uL   Hemoglobin 10.6 (L) 12.0 - 15.0 g/dL   HCT 34.3 (L) 36.0 - 46.0 %   MCV 71.5 (L) 80.0 - 100.0 fL   MCH 22.1 (L) 26.0 - 34.0 pg   MCHC 30.9 30.0 - 36.0 g/dL   RDW 18.1 (H) 11.5 - 15.5 %   Platelets 345 150 - 400 K/uL   nRBC 0.0 0.0 - 0.2 %  I-Stat beta hCG blood, ED  Result Value Ref Range   I-stat hCG, quantitative <5.0 <5 mIU/mL   Comment 3           US PELVIS (TRANSABDOMINAL ONLY)  Result Date: 06/08/2020 CLINICAL DATA:  24 year old female with pelvic pain and vaginal bleeding for 3 days. History of endometriosis. EXAM: TRANSABDOMINAL ULTRASOUND OF PELVIS TECHNIQUE: Transabdominal ultrasound examination of the pelvis was performed including evaluation of the uterus, ovaries, adnexal regions, and pelvic cul-de-sac. COMPARISON:  Recent pelvis ultrasound 06/06/2020. FINDINGS: Technologist reports the patient was unable to tolerate transvaginal imaging. Uterus Measurements: 6.8 x 3.8 x 4.0 cm = volume: 54 mL. No fibroids or other mass visualized. Endometrium Thickness: Less well visualized today, approximately 5 mm. No abnormality is evident. Right ovary Measurements: Could not be visualized today. Left ovary Measurements: Could not be visualized today. Other findings:  No pelvic free fluid identified. IMPRESSION: Limited transabdominal pelvis ultrasound, and patient unable to tolerate transvaginal imaging today. Uterus appears normal. Ovaries not identified. No pelvic free fluid. Electronically Signed   By: Genevie Ann M.D.   On: 06/08/2020 06:59   CT ABDOMEN PELVIS W CONTRAST  Result Date: 05/30/2020 CLINICAL DATA:  Acute abdominal pain. EXAM: CT ABDOMEN AND PELVIS WITH CONTRAST TECHNIQUE: Multidetector CT imaging of the abdomen and pelvis was performed using the standard protocol following bolus administration of intravenous contrast. CONTRAST:  162m OMNIPAQUE IOHEXOL 300 MG/ML  SOLN  COMPARISON:  Multiple priors, most recently 05/05/2020. FINDINGS: Lower chest: The lung bases are clear. No acute airspace disease or pleural effusion. Hepatobiliary: Small cyst in the right dome of the liver. No new or suspicious hepatic lesions. Clips in the gallbladder fossa postcholecystectomy. No biliary dilatation. Pancreas: No ductal dilatation or inflammation. Spleen: Normal in size without focal abnormality. Adrenals/Urinary Tract: Normal adrenal glands. No hydronephrosis or perinephric edema. Homogeneous renal enhancement. No visualized renal calculi or focal lesions. Urinary bladder is physiologically distended without wall thickening. Stomach/Bowel: Unremarkable stomach. No small bowel obstruction, wall thickening, or inflammation. Appendectomy. Moderate colonic stool burden without wall thickening or inflammation. No abnormal rectal distention. Vascular/Lymphatic: Normal caliber abdominal aorta. No acute vascular findings. Patent portal vein. No abdominopelvic adenopathy. Reproductive: Unremarkable uterus. Right ovary is tentatively visualized and normal. The left ovary is not seen. No suspicious adnexal mass. Other: No free air, free fluid, or intra-abdominal fluid collection. Musculoskeletal: There are no acute or suspicious osseous abnormalities. IMPRESSION: 1. No acute abnormality in the abdomen/pelvis. 2. Moderate colonic stool burden, recommend correlation for constipation. Electronically Signed   By: MKeith RakeM.D.   On: 05/30/2020 22:29   [place]PELV[place] COMPLETE WITH TRANSVAGINAL  Result Date: 06/06/2020 CLINICAL DATA:  Pelvic pain, history of endometriosis EXAM: TRANSABDOMINAL ULTRASOUND OF PELVIS TECHNIQUE: Transabdominal ultrasound examination of the pelvis was performed including evaluation of the uterus, ovaries, adnexal  regions, and pelvic cul-de-sac. COMPARISON:  January 02, 2000 FINDINGS: Uterus Measurements: 7.0 x 3.6 x 3.1 cm = volume: 41 mL. Anteverted without fibroids or other  mass visualized. Endometrium Thickness: 2.  No focal abnormality visualized. Right ovary Not visualized Left ovary Not visualized Other findings:  No abnormal free fluid. Patient was unable to tolerate the transvaginal portion of the exam. IMPRESSION: Patient was unable to tolerate the transvaginal portion of the exam within this context: Normal appearing uterus and ovaries not visualized. Electronically Signed   By: Dahlia Bailiff MD   On: 06/06/2020 19:07    EKG None  Radiology No results found.  Procedures Procedures   Medications Ordered in ED Medications  HYDROmorphone (DILAUDID) injection 1 mg (has no administration in time range)  ondansetron (ZOFRAN) injection 4 mg (has no administration in time range)  sodium chloride 0.9 % bolus 1,000 mL (has no administration in time range)    ED Course  I have reviewed the triage vital signs and the nursing notes.  Pertinent labs & imaging results that were available during my care of the patient were reviewed by me and considered in my medical decision making (see chart for details).    MDM Rules/Calculators/A&P                         Labs sent.  Reviewed nursing notes and prior charts for additional history. Several recent imaging studies for same symptoms including ct and u/s - no acute process identified, no large adnexal mass/cyst noted.   Given recent imaging for same symptoms, and plan/scheduled outpatient open mri, do not feel additional imaging tonight would help patient.   Labs reviewed/interpreted by me - wbc normal. k sl low, kcl po.   Dilaudid 1 mg iv. zofran iv. Iv ns bolus.   Recheck pain improved but persists. Abd is soft, non tender. No nv. Po fluids/food. Dilaudid 1 mg.   Some bp reading noted to be high (one very high) - at time, bp cuff wrapped awkwardly on forearm - suspect spurious reading - recheck elevated bp, but much improved/lower. BP currently 155/103.   Pt currently appears stable for d/c. rec close f/u  with her gyn doctor.   Return precautions provided.     Final Clinical Impression(s) / ED Diagnoses Final diagnoses:  None    Rx / DC Orders ED Discharge Orders    None       Lajean Saver, MD 06/10/20 2304

## 2020-06-10 NOTE — Discharge Instructions (Addendum)
It was our pleasure to provide your ER care today - we hope that you feel better.  Drink plenty of fluids/stay well hydrated.   Take your pain medication as need.   Follow up with your gynecologist Monday to confirm plan for your MRI and follow up appointment with them.   Your blood pressure is high tonight - limit salt intake, eat heart healthy meal plan, and follow up with primary care doctor in the coming week.   Return to ER if worse, new symptoms, fevers, persistent vomiting, or other concern.  You were given pain medication in the ER - no driving for the next 10 hours.

## 2020-06-10 NOTE — ED Provider Notes (Signed)
Emergency Medicine Provider Triage Evaluation Note  [name] , a 25 y.o. female  was evaluated in triage.  Pt complains of severe abdominal pain, N/V, intrusive to her life. Multiple visits for the same - hx of severe endometriosis. In pharmacy school. Exploratory surgery to be scheduled by OBGYN for further eval of pain, however patient says she cannot wait any longer. Hx of lupus, hx of PE, on eliquis.  Review of Systems  Positive: Abdominal pain, cramping, N/V vaginal bleeding, pelvic pain Negative: Diarrhea, fever, chills  Physical Exam  BP (!) 161/134 (BP Location: Left Arm)   Pulse (!) 106   Temp 98.2 F (36.8 C) (Oral)   Resp 18   Ht 5' 2"  (1.575 m)   Wt 115.6 kg   LMP 06/06/2020   SpO2 96%   BMI 46.62 kg/m  Gen:   Awake, no distress   Resp:  Normal effort  MSK:   Moves extremities without difficulty  Other:    Medical Decision Making  Medically screening exam initiated at 4:52 PM.  Appropriate orders placed.  [name] was informed that the remainder of the evaluation will be completed by another provider, this initial triage assessment does not replace that evaluation, and the importance of remaining in the ED until their evaluation is complete.  This chart was dictated using voice recognition software, Dragon. Despite the best efforts of this provider to proofread and correct errors, errors may still occur which can change documentation meaning.    Aura Dials 06/10/20 1658    Drenda Freeze, MD 06/10/20 2252

## 2020-06-12 ENCOUNTER — Emergency Department (HOSPITAL_COMMUNITY): Payer: PPO

## 2020-06-12 ENCOUNTER — Emergency Department (HOSPITAL_COMMUNITY)
Admission: EM | Admit: 2020-06-12 | Discharge: 2020-06-12 | Disposition: A | Discharge status: Home / Self Care | Payer: PPO | Attending: Emergency Medicine | Admitting: Emergency Medicine

## 2020-06-12 ENCOUNTER — Other Ambulatory Visit: Payer: Self-pay

## 2020-06-12 DIAGNOSIS — J45909 Unspecified asthma, uncomplicated: Secondary | ICD-10-CM | POA: Insufficient documentation

## 2020-06-12 DIAGNOSIS — R112 Nausea with vomiting, unspecified: Secondary | ICD-10-CM | POA: Insufficient documentation

## 2020-06-12 DIAGNOSIS — Z7951 Long term (current) use of inhaled steroids: Secondary | ICD-10-CM | POA: Insufficient documentation

## 2020-06-12 DIAGNOSIS — Z91010 Allergy to peanuts: Secondary | ICD-10-CM | POA: Diagnosis not present

## 2020-06-12 DIAGNOSIS — R109 Unspecified abdominal pain: Secondary | ICD-10-CM | POA: Insufficient documentation

## 2020-06-12 DIAGNOSIS — Z7901 Long term (current) use of anticoagulants: Secondary | ICD-10-CM | POA: Insufficient documentation

## 2020-06-12 LAB — I-STAT BETA HCG BLOOD, ED (MC, WL, AP ONLY): I-stat hCG, quantitative: <5 m[IU]/mL (ref <5)

## 2020-06-12 LAB — COMPREHENSIVE METABOLIC PANEL
ALT: 21 U/L (ref 0–44)
AST: 28 U/L (ref 15–41)
Albumin: 4 g/dL (ref 3.5–5.0)
Alkaline Phosphatase: 63 U/L (ref 38–126)
Anion gap: 7 (ref 5–15)
BUN: 8 mg/dL (ref 6–20)
CO2: 24 mmol/L (ref 22–32)
Calcium: 8.6 mg/dL — ABNORMAL LOW (ref 8.9–10.3)
Chloride: 108 mmol/L (ref 98–111)
Creatinine, Ser: 0.82 mg/dL (ref 0.44–1.00)
GFR, Estimated: >60 mL/min (ref >60)
Glucose, Bld: 96 mg/dL (ref 70–99)
Potassium: 3.5 mmol/L (ref 3.5–5.1)
Sodium: 139 mmol/L (ref 135–145)
Total Bilirubin: 0.1 mg/dL — ABNORMAL LOW (ref 0.3–1.2)
Total Protein: 7 g/dL (ref 6.5–8.1)

## 2020-06-12 LAB — CBC WITH DIFFERENTIAL/PLATELET
Abs Immature Granulocytes: 0.02 10*3/uL (ref 0.00–0.07)
Basophils Absolute: 0 10*3/uL (ref 0.0–0.1)
Basophils Relative: 0 %
Eosinophils Absolute: 0 10*3/uL (ref 0.0–0.5)
Eosinophils Relative: 0 %
HCT: 32.7 % — ABNORMAL LOW (ref 36.0–46.0)
Hemoglobin: 10 g/dL — ABNORMAL LOW (ref 12.0–15.0)
Immature Granulocytes: 0 %
Lymphocytes Relative: 22 %
Lymphs Abs: 1.5 10*3/uL (ref 0.7–4.0)
MCH: 21.8 pg — ABNORMAL LOW (ref 26.0–34.0)
MCHC: 30.6 g/dL (ref 30.0–36.0)
MCV: 71.2 fL — ABNORMAL LOW (ref 80.0–100.0)
Monocytes Absolute: 0.2 10*3/uL (ref 0.1–1.0)
Monocytes Relative: 2 %
Neutro Abs: 5.1 10*3/uL (ref 1.7–7.7)
Neutrophils Relative %: 76 %
Platelets: 312 10*3/uL (ref 150–400)
RBC: 4.59 MIL/uL (ref 3.87–5.11)
RDW: 17.9 % — ABNORMAL HIGH (ref 11.5–15.5)
WBC: 6.7 10*3/uL (ref 4.0–10.5)
nRBC: 0 % (ref 0.0–0.2)

## 2020-06-12 LAB — URINALYSIS, ROUTINE W REFLEX MICROSCOPIC
Bilirubin Urine: NEGATIVE
Glucose, UA: NEGATIVE mg/dL
Hgb urine dipstick: NEGATIVE
Ketones, ur: NEGATIVE mg/dL
Leukocytes,Ua: NEGATIVE
Nitrite: NEGATIVE
Protein, ur: NEGATIVE mg/dL
Specific Gravity, Urine: 1.015 (ref 1.005–1.030)
pH: 5 (ref 5.0–8.0)

## 2020-06-12 LAB — TYPE AND SCREEN
ABO/RH(D): AB POS
Antibody Screen: NEGATIVE

## 2020-06-12 MED ORDER — HYDROMORPHONE HCL 1 MG/ML IJ SOLN
1.0000 mg | Freq: Once | INTRAMUSCULAR | Status: AC
  Administered 2020-06-12: 1 mg via INTRAVENOUS
  Filled 2020-06-12: qty 1

## 2020-06-12 MED ORDER — SODIUM CHLORIDE 0.9 % IV BOLUS
1000.0000 mL | Freq: Once | INTRAVENOUS | Status: AC
  Administered 2020-06-12: 1000 mL via INTRAVENOUS

## 2020-06-12 MED ORDER — HYDROMORPHONE HCL 1 MG/ML IJ SOLN
1.0000 mg | Freq: Once | INTRAMUSCULAR | Status: AC
Start: 2020-06-12 — End: 2020-06-12
  Administered 2020-06-12: 1 mg via INTRAVENOUS
  Filled 2020-06-12: qty 1

## 2020-06-12 MED ORDER — DIPHENHYDRAMINE HCL 25 MG PO CAPS
50.0000 mg | ORAL_CAPSULE | Freq: Once | ORAL | Status: AC
  Administered 2020-06-12: 50 mg via ORAL
  Filled 2020-06-12: qty 2

## 2020-06-12 MED ORDER — ONDANSETRON HCL 4 MG/2ML IJ SOLN
4.0000 mg | Freq: Once | INTRAMUSCULAR | Status: AC
  Administered 2020-06-12: 4 mg via INTRAVENOUS
  Filled 2020-06-12: qty 2
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
## 2020-06-12 NOTE — Discharge Instructions (Signed)
Follow-up with your GYN doctor to discuss your ongoing pain

## 2020-06-12 NOTE — ED Provider Notes (Addendum)
Emergency Medicine Provider Triage Evaluation Note  [name] , a 25 y.o. female  was evaluated in triage.  Pt complains of emesis x 3 hours. She woke up at 0300 with nausea and had over 5 episodes of emesis. She states vomit is streaked with bright red blood. She has severe abdominal pain that radiates to her back. No meds PTA. She has history of endometriosis, PE on eliquis. She is followed by OBGYN at [facility]. This is her 6th ED visit this month, last seen x 2 days ago.  Review of Systems  Positive: Abdominal pain, emesis, vaginal bleeding Negative: Fever, chills, urinary symptoms  Physical Exam  BP (!) 152/92 (BP Location: Right Arm)   Pulse (!) 108   Temp 98 F (36.7 C) (Oral)   Resp 18   Ht 5' 2"  (1.575 m)   Wt 113.4 kg   LMP 06/06/2020   SpO2 99%   BMI 45.73 kg/m  Gen:   Awake, no distress   Resp:  Normal effort  MSK:   Moves extremities without difficulty  Other:  Diffuse abdominal tenderness with voluntary guarding. No active vomiting  Medical Decision Making  Medically screening exam initiated at 6:50 AM.  Appropriate orders placed.  [name] was informed that the remainder of the evaluation will be completed by another provider, this initial triage assessment does not replace that evaluation, and the importance of remaining in the ED until their evaluation is complete.     Barrie Folk, PA-C 06/12/20 0707    Barrie Folk, PA-C 06/12/20 0710    Daleen Bo, MD 06/12/20 [phone]

## 2020-06-12 NOTE — ED Triage Notes (Signed)
Pt came in with c/o abdominal pain, n/v, and hematemesis. Pt states that she started having abdominal pain consistently but it has been getting worse. Pt started vomiting at 0300. Pt states that she is now vomiting blood. Pt has hx of endometriosis

## 2020-06-13 ENCOUNTER — Emergency Department (HOSPITAL_BASED_OUTPATIENT_CLINIC_OR_DEPARTMENT_OTHER): Payer: PPO | Admitting: Radiology

## 2020-06-13 ENCOUNTER — Emergency Department (HOSPITAL_COMMUNITY)
Admission: EM | Admit: 2020-06-13 | Discharge: 2020-06-13 | Discharge status: Against Medical Advice | Payer: PPO | Attending: Emergency Medicine | Admitting: Emergency Medicine

## 2020-06-13 ENCOUNTER — Encounter (HOSPITAL_COMMUNITY): Payer: Self-pay | Admitting: *Deleted

## 2020-06-13 ENCOUNTER — Emergency Department (HOSPITAL_BASED_OUTPATIENT_CLINIC_OR_DEPARTMENT_OTHER)
Admission: EM | Admit: 2020-06-13 | Discharge: 2020-06-13 | Disposition: A | Discharge status: Home / Self Care | Payer: PPO | Source: Home / Self Care | Attending: Emergency Medicine | Admitting: Emergency Medicine

## 2020-06-13 ENCOUNTER — Other Ambulatory Visit: Payer: Self-pay

## 2020-06-13 DIAGNOSIS — Z91010 Allergy to peanuts: Secondary | ICD-10-CM | POA: Insufficient documentation

## 2020-06-13 DIAGNOSIS — K920 Hematemesis: Secondary | ICD-10-CM | POA: Insufficient documentation

## 2020-06-13 DIAGNOSIS — Z20822 Contact with and (suspected) exposure to covid-19: Secondary | ICD-10-CM | POA: Insufficient documentation

## 2020-06-13 DIAGNOSIS — R1030 Lower abdominal pain, unspecified: Secondary | ICD-10-CM | POA: Diagnosis present

## 2020-06-13 DIAGNOSIS — R1084 Generalized abdominal pain: Secondary | ICD-10-CM

## 2020-06-13 DIAGNOSIS — K226 Gastro-esophageal laceration-hemorrhage syndrome: Secondary | ICD-10-CM | POA: Insufficient documentation

## 2020-06-13 DIAGNOSIS — J45909 Unspecified asthma, uncomplicated: Secondary | ICD-10-CM | POA: Insufficient documentation

## 2020-06-13 DIAGNOSIS — Z7901 Long term (current) use of anticoagulants: Secondary | ICD-10-CM | POA: Insufficient documentation

## 2020-06-13 DIAGNOSIS — R1110 Vomiting, unspecified: Secondary | ICD-10-CM | POA: Insufficient documentation

## 2020-06-13 LAB — CBC WITH DIFFERENTIAL/PLATELET
Abs Immature Granulocytes: 0.03 10*3/uL (ref 0.00–0.07)
Basophils Absolute: 0 10*3/uL (ref 0.0–0.1)
Basophils Relative: 0 %
Eosinophils Absolute: 0.1 10*3/uL (ref 0.0–0.5)
Eosinophils Relative: 1 %
HCT: 32.3 % — ABNORMAL LOW (ref 36.0–46.0)
Hemoglobin: 10.1 g/dL — ABNORMAL LOW (ref 12.0–15.0)
Immature Granulocytes: 0 %
Lymphocytes Relative: 53 %
Lymphs Abs: 5 10*3/uL — ABNORMAL HIGH (ref 0.7–4.0)
MCH: 22.1 pg — ABNORMAL LOW (ref 26.0–34.0)
MCHC: 31.3 g/dL (ref 30.0–36.0)
MCV: 70.7 fL — ABNORMAL LOW (ref 80.0–100.0)
Monocytes Absolute: 0.6 10*3/uL (ref 0.1–1.0)
Monocytes Relative: 7 %
Neutro Abs: 3.6 10*3/uL (ref 1.7–7.7)
Neutrophils Relative %: 39 %
Platelets: 319 10*3/uL (ref 150–400)
RBC: 4.57 MIL/uL (ref 3.87–5.11)
RDW: 17.9 % — ABNORMAL HIGH (ref 11.5–15.5)
WBC: 9.3 10*3/uL (ref 4.0–10.5)
nRBC: 0 % (ref 0.0–0.2)

## 2020-06-13 LAB — COMPREHENSIVE METABOLIC PANEL
ALT: 19 U/L (ref 0–44)
AST: 21 U/L (ref 15–41)
Albumin: 3.9 g/dL (ref 3.5–5.0)
Alkaline Phosphatase: 63 U/L (ref 38–126)
Anion gap: 7 (ref 5–15)
BUN: 7 mg/dL (ref 6–20)
CO2: 26 mmol/L (ref 22–32)
Calcium: 8.9 mg/dL (ref 8.9–10.3)
Chloride: 109 mmol/L (ref 98–111)
Creatinine, Ser: 0.84 mg/dL (ref 0.44–1.00)
GFR, Estimated: >60 mL/min (ref >60)
Glucose, Bld: 91 mg/dL (ref 70–99)
Potassium: 3.4 mmol/L — ABNORMAL LOW (ref 3.5–5.1)
Sodium: 142 mmol/L (ref 135–145)
Total Bilirubin: 0.3 mg/dL (ref 0.3–1.2)
Total Protein: 6.9 g/dL (ref 6.5–8.1)

## 2020-06-13 LAB — I-STAT BETA HCG BLOOD, ED (MC, WL, AP ONLY): I-stat hCG, quantitative: <5 m[IU]/mL (ref <5)

## 2020-06-13 LAB — CBC
HCT: 31 % — ABNORMAL LOW (ref 36.0–46.0)
Hemoglobin: 9.7 g/dL — ABNORMAL LOW (ref 12.0–15.0)
MCH: 21.9 pg — ABNORMAL LOW (ref 26.0–34.0)
MCHC: 31.3 g/dL (ref 30.0–36.0)
MCV: 70.1 fL — ABNORMAL LOW (ref 80.0–100.0)
Platelets: 300 10*3/uL (ref 150–400)
RBC: 4.42 MIL/uL (ref 3.87–5.11)
RDW: 17.5 % — ABNORMAL HIGH (ref 11.5–15.5)
WBC: 10.5 10*3/uL (ref 4.0–10.5)
nRBC: 0 % (ref 0.0–0.2)

## 2020-06-13 LAB — RESP PANEL BY RT-PCR (FLU A&B, COVID) ARPGX2
Influenza A by PCR: NEGATIVE
Influenza B by PCR: NEGATIVE
SARS Coronavirus 2 by RT PCR: NEGATIVE

## 2020-06-13 LAB — LIPASE, BLOOD: Lipase: 45 U/L (ref 11–51)

## 2020-06-13 MED ORDER — FENTANYL CITRATE (PF) 100 MCG/2ML IJ SOLN
100.0000 ug | Freq: Once | INTRAMUSCULAR | Status: AC
  Administered 2020-06-13: 100 ug via INTRAVENOUS
  Filled 2020-06-13: qty 2

## 2020-06-13 MED ORDER — DIPHENHYDRAMINE HCL 25 MG PO CAPS
50.0000 mg | ORAL_CAPSULE | Freq: Once | ORAL | Status: AC
  Administered 2020-06-13: 50 mg via ORAL
  Filled 2020-06-13: qty 2

## 2020-06-13 MED ORDER — ONDANSETRON HCL 4 MG/2ML IJ SOLN
INTRAMUSCULAR | Status: AC
  Administered 2020-06-13: 4 mg via INTRAVENOUS
  Filled 2020-06-13: qty 2

## 2020-06-13 MED ORDER — ONDANSETRON HCL 4 MG/2ML IJ SOLN: 4.0000 mg | Freq: Once | INTRAMUSCULAR | Status: AC

## 2020-06-13 MED ORDER — OXYCODONE HCL 5 MG PO TABS: 5.0000 mg | ORAL_TABLET | ORAL | 0 refills | Status: DC | PRN

## 2020-06-13 MED ORDER — METOCLOPRAMIDE HCL 5 MG/ML IJ SOLN
10.0000 mg | Freq: Once | INTRAMUSCULAR | Status: DC
  Filled 2020-06-13: qty 2

## 2020-06-13 MED ORDER — SODIUM CHLORIDE 0.9 % IV BOLUS
1000.0000 mL | Freq: Once | INTRAVENOUS | Status: AC
  Administered 2020-06-13: 1000 mL via INTRAVENOUS

## 2020-06-13 NOTE — ED Provider Notes (Signed)
[facility] EMERGENCY DEPT Provider Note   CSN: [id number] Arrival date & time: 06/13/20  1640     History Chief Complaint  Patient presents with  . Emesis    [name] is a 25 y.o. female.  HPI 25 year old female presents with vomiting and abdominal pain.  She has been having abdominal pain for at least 1 week as well as vomiting during that time.  Over the last 2 days she states is now bloody emesis.  She states it is only blood.  This has happened numerous times.  No significant fevers.  Some diarrhea.  She has been having a lot of issues with endometriosis and states this is the pain she is describing now and it is severe.  Has had extensive work-up and is waiting for an MRI so she can have surgery. Her chest feels sore from all the vomiting.  The vomiting is severe to the point that she is straining while vomiting.  Past Medical History:  Diagnosis Date  . Asthma   . Celiac disease   . Collagen vascular disease ([place])   . Diarrhea    Patient mentions diagnosis of ulcerative colitis but it is not clear she actually has UC  . Endometriosis   . Lupus ([place])   . Ovarian cyst   . Panic disorder 05/30/2020  . Pulmonary embolus ([place]) 02/06/2020    Patient Active Problem List   Diagnosis Date Noted  . 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 Panic disorder 05/30/2020  . Major depressive disorder 05/29/2020  . Suicide attempt by acetaminophen overdose ([place]) 05/29/2020  . Obesity, Class III, BMI 40-49.9 (morbid obesity) ([place]) 04/05/2020  . SOB (shortness of breath) 04/05/2020  . Abdominal pain 04/04/2020  . SLE (systemic lupus erythematosus related syndrome) ([place]) 04/04/2020  . Shortness of breath 04/04/2020  . Pneumonia 03/23/2020  . Chronic nausea 03/23/2020  . Asthma 03/22/2020  . Dysuria 02/03/2020  . Hematuria 02/03/2020  . Mass of urinary bladder 01/26/2020  . Allergic rhinitis 05/05/2019  .  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 Endometriosis 03/14/2018  . Long-term use of high-risk medication 07/02/2017  . Lupus anticoagulant positive 07/02/2017  . Personal history of pulmonary embolism 07/02/2017  . Other forms of systemic lupus erythematosus ([place]) 03/12/2017  . Anemia 02/28/2017  . Migraine 02/28/2017  . Dysmenorrhea 08/28/2011  . Gastritis 08/28/2011    Past Surgical History:  Procedure Laterality Date  . APPENDECTOMY    . CHOLECYSTECTOMY    . EXCISION OF ENDOMETRIOMA     Ovarian cyst removal  . excision of endometriosis    . FOOT FRACTURE SURGERY     w/ hardware  . HERNIA REPAIR    . TONSILLECTOMY       OB History   No obstetric history on file.     Family History  Problem Relation Age of Onset  . Hypertension Mother   . Hypercholesterolemia Mother   . Rheum arthritis Mother   . Diabetes Father   . Hypertension Father   . Cancer Father   . Autism Brother     Social History   Tobacco Use  . Smoking status: Never Smoker  . Smokeless tobacco: Never Used  Vaping Use  . Vaping Use: Never used  Substance Use Topics  . Alcohol use: Never  . Drug use: Never    Home Medications Prior to Admission medications   Medication Sig Start Date End Date Taking? Authorizing Provider  oxyCODONE (ROXICODONE) 5 MG  immediate release tablet Take 1 tablet (5 mg total) by mouth every 4 (four) hours as needed for severe pain. 06/13/20  Yes Sherwood Gambler, MD  ALPRAZolam Duanne Moron) 1 MG tablet Take 1.5 tablets by mouth 2 (two) times daily as needed. 06/02/20   [provider]  apixaban (ELIQUIS) 5 MG TABS tablet Take 5 mg by mouth 2 (two) times daily.    [provider]  EPINEPHrine 0.3 mg/0.3 mL IJ SOAJ injection Inject 0.3 mg into the muscle daily as needed (Allergic Reaction). 06/01/20   Lindell Spar I, NP  fluticasone (FLONASE) 50 MCG/ACT nasal spray Place 1 spray into both nostrils daily as needed for allergies or rhinitis.    [provider]   hydroxychloroquine (PLAQUENIL) 200 MG tablet Take 2 tablets (400 mg total) by mouth daily. Patient taking differently: Take 400 mg by mouth at bedtime. 03/23/20   Rice, Resa Miner, MD  Norethindrone-Ethinyl Estradiol-Fe Biphas (LO LOESTRIN FE) 1 MG-10 MCG / 10 MCG tablet Take 1 tablet by mouth daily. 05/11/20 08/09/20  Wieters, Hallie C, PA-C  promethazine (PHENERGAN) 25 MG tablet Take 1 tablet (25 mg total) by mouth every 6 (six) hours as needed for nausea or vomiting. 06/06/20   Carlisle Cater, PA-C  promethazine (PHENERGAN) 25 MG tablet Take 1 tablet (25 mg total) by mouth every 8 (eight) hours as needed for up to 20 doses for nausea or vomiting. 06/08/20   Curatolo, Adam, DO  sertraline (ZOLOFT) 100 MG tablet Take 2 tablets (200 mg total) by mouth at bedtime. For depression 06/01/20   Lindell Spar I, NP  zolpidem (AMBIEN) 5 MG tablet Take 1 tablet (5 mg total) by mouth at bedtime as needed for sleep. Patient taking differently: Take 20 mg by mouth at bedtime as needed for sleep. 06/01/20   Lindell Spar I, NP  dicyclomine (BENTYL) 20 MG tablet Take 1 tablet (20 mg total) by mouth 2 (two) times daily. 11/29/18 04/11/19  Muthersbaugh, Jarrett Soho, PA-C  Fluticasone Propionate HFA (FLOVENT HFA IN) Inhale into the lungs. Patient not taking: Reported on 11/14/2019  11/14/19  [provider]    Allergies    Azithromycin, Peanut-containing drug products, Iodinated diagnostic agents, Other, Amoxicillin, Fish allergy, Gluten meal, Hydromet [hydrocodone bit-homatrop mbr], Ibuprofen, Lactose intolerance (gi), Morphine and related, and Pantoprazole  Review of Systems   Review of Systems  Constitutional: Negative for fever.  Gastrointestinal: Positive for abdominal pain, diarrhea, nausea and vomiting.  All other systems reviewed and are negative.   Physical Exam Updated Vital Signs BP (!) 111/59   Pulse 79   Temp 98 F (36.7 C)   Resp 16   LMP 06/06/2020   SpO2 100%   Physical Exam Vitals and  nursing note reviewed.  Constitutional:      General: She is not in acute distress.    Appearance: She is well-developed. She is obese. She is not ill-appearing or diaphoretic.  HENT:     Head: Normocephalic and atraumatic.     Right Ear: External ear normal.     Left Ear: External ear normal.     Nose: Nose normal.  Eyes:     General:        Right eye: No discharge.        Left eye: No discharge.  Cardiovascular:     Rate and Rhythm: Normal rate and regular rhythm.     Heart sounds: Normal heart sounds.  Pulmonary:     Effort: Pulmonary effort is normal.  Breath sounds: Normal breath sounds.  Abdominal:     Palpations: Abdomen is soft.     Tenderness: There is generalized abdominal tenderness.  Skin:    General: Skin is warm and dry.  Neurological:     Mental Status: She is alert.  Psychiatric:        Mood and Affect: Mood is not anxious.     ED Results / Procedures / Treatments   Labs (all labs ordered are listed, but only abnormal results are displayed) Labs Reviewed  CBC - Abnormal; Notable for the following components:      Result Value   Hemoglobin 9.7 (*)    HCT 31.0 (*)    MCV 70.1 (*)    MCH 21.9 (*)    RDW 17.5 (*)    All other components within normal limits  RESP PANEL BY RT-PCR (FLU A&B, COVID) [id number]    EKG None  Radiology DG Chest Portable 1 View  Result Date: 06/12/2020 CLINICAL DATA:  25 year old female with hematemesis this morning. Severe abdominal pain. EXAM: PORTABLE CHEST 1 VIEW COMPARISON:  Chest radiographs 04/30/2020 and earlier. FINDINGS: Portable AP semi upright view at 0804 hours. Normal lung volumes. Normal cardiac size and mediastinal contours. Visualized tracheal air column is within normal limits. Allowing for portable technique the lungs are clear. No pneumothorax or pleural effusion. No osseous abnormality identified. Paucity of bowel gas in the upper abdomen. IMPRESSION: Negative portable chest. Electronically Signed   By: Genevie Ann M.D.   On: 06/12/2020 08:51   DG ABD ACUTE 2+V W 1V CHEST  Result Date: 06/13/2020 CLINICAL DATA:  Hematemesis EXAM: DG ABDOMEN ACUTE WITH 1 VIEW CHEST COMPARISON:  CT 05/30/2020, chest radiograph 06/12/2020 FINDINGS: No high-grade obstructive bowel gas pattern. No suspicious abdominal calcifications. Cholecystectomy clips in the right upper quadrant. No subdiaphragmatic free air. Benign phleboliths in the pelvis. No consolidation, features of edema, pneumothorax, or effusion. The cardiomediastinal contours are unremarkable. No other acute or worrisome soft tissue abnormality. No acute osseous abnormalities. IMPRESSION: Negative abdominal radiographs.  No acute cardiopulmonary disease. Electronically Signed   By: Lovena Le M.D.   On: 06/13/2020 20:17    Procedures Procedures   Medications Ordered in ED Medications  sodium chloride 0.9 % bolus 1,000 mL (0 mLs Intravenous Stopped 06/13/20 2214)  fentaNYL (SUBLIMAZE) injection 100 mcg (100 mcg Intravenous Given 06/13/20 1956)  ondansetron (ZOFRAN) injection 4 mg (4 mg Intravenous Given 06/13/20 2002)  diphenhydrAMINE (BENADRYL) capsule 50 mg (50 mg Oral Given 06/13/20 2128)  fentaNYL (SUBLIMAZE) injection 100 mcg (100 mcg Intravenous Given 06/13/20 2129)    ED Course  I have reviewed the triage vital signs and the nursing notes.  Pertinent labs & imaging results that were available during my care of the patient were reviewed by me and considered in my medical decision making (see chart for details).    MDM Rules/Calculators/A&P                          Patient has had an extensive work-up of these last few weeks including CTs and pelvic ultrasounds.  She is due for an MRI tomorrow.  She is no longer vomiting in the emergency department.  No black or bloody stools.  My suspicion is that the blood she is describing is from a Mallory-Weiss tear made a little more prominent from Eliquis. Planned for discharge though later patient asked me to  be observed in the hospital. Due to this  I consulted GI, Dr. Cristina Gong, and we discussed the case. We discussed that this is probably mallory-weiss, and if she wanted to come in or be discharged it would probably be fine either way. GI will consult if she's admitted. Thus, started admission process but now patient tells me she wants to leave.  Given no vomiting in the emergency department and especially no hematemesis with stable vitals I think this is reasonable and thus she will be discharged to follow-up with GI and her OB/GYN.  Given return precautions. Final Clinical Impression(s) / ED Diagnoses Final diagnoses:  Generalized abdominal pain  Mallory-Weiss tear    Rx / DC Orders ED Discharge Orders         Ordered    oxyCODONE (ROXICODONE) 5 MG immediate release tablet  Every 4 hours PRN        06/13/20 2239           Sherwood Gambler, MD 06/13/20 2333

## 2020-06-13 NOTE — ED Notes (Signed)
Mom ,[phone], would like to be called. Says daughter has been here several times and not getting better, getting worse.

## 2020-06-13 NOTE — ED Notes (Signed)
Pt stated she was leaving and going to [facility]

## 2020-06-13 NOTE — ED Triage Notes (Signed)
Pt reports throwing up blood for the past 2 days. She reports she has passed out 3 times today, 7 times yesterday.

## 2020-06-13 NOTE — ED Triage Notes (Signed)
Patient reports she has been throwing up blood multiple times. Patient reports she went to the ER and was discharged. Patient reports she has spoken to her PCP and they told her to go to the ER again. Patient went to [facility] this morning and was told it was a 9 hour wait. Patient then went to [facility]. Patient also reports she was told there was a long wait and sat for hours.

## 2020-06-13 NOTE — Discharge Instructions (Addendum)
If you develop worsening, continued, or recurrent abdominal pain, vomiting blood, uncontrolled vomiting, fever, chest or back pain, or any other new/concerning symptoms then return to the ER for evaluation.

## 2020-06-13 NOTE — ED Notes (Signed)
Per sister, states she is worried that her sister is not getting the care she needs-has been to ED several times within the past month-states no one can tell her what is going on with her

## 2020-06-13 NOTE — ED Provider Notes (Signed)
Emergency Medicine Provider Triage Evaluation Note  [name] 25 y.o. female was evaluated in triage.  Pt complains of abdominal pain, vomiting.  She states this has been ongoing for about 3 weeks.  She has continued to have lower abdominal pain.  She is seen her OB/GYN and she was instructed to go to the emergency department.  She states she started vomiting.  Last 2 days, she started vomiting blood.  She is on Eliquis for treatment of PEs.  She has not had any diarrhea.  No urinary complaints.  No fever.   Review of Systems  Positive: Abdominal pain, vomiting Negative: Fever, diarrhea, cough.  Physical Exam  BP 134/82   Pulse 70   Temp 98.2 F (36.8 C) (Oral)   Resp 18   Ht 5' 4"  (1.626 m)   Wt 65.8 kg   SpO2 100%   BMI 24.89 kg/m  Gen:   Awake, no distress   HEENT:  Atraumatic  Resp:  Normal effort  Cardiac:  Normal rate  Abd:   Nondistended, abdomen is soft, nondistended.  Diffuse lower tenderness.  No rigidity, guarding. MSK:   Moves extremities without difficulty  Neuro:  Speech clear   Other:      Medical Decision Making  Medically screening exam initiated at 2:03 PM  Appropriate orders placed.  Taimi Roshana Shuffield was informed that the remainder of the evaluation will be completed by another provider, this initial triage assessment does not replace that evaluation. They are counseled that they will need to remain in the ED until the completion of their workup, including full H&P and results of any tests.  Risks of leaving the emergency department prior to completion of treatment were discussed. Patient was advised to inform ED staff if they are leaving before their treatment is complete. The patient acknowledged these risks and time was allowed for questions.     The patient appears stable so that the remainder of the MSE may be completed by another provider.  Clinical Impression  abd pain   Portions of this note were generated with Dragon dictation software.  Dictation errors may occur despite best attempts at proofreading.      Volanda Napoleon, PA-C 06/13/20 1405    Lennice Sites, DO 06/13/20 [phone]

## 2020-06-14 ENCOUNTER — Ambulatory Visit
Admit: 2020-06-14 | Discharge: 2020-06-14 | Disposition: A | Discharge status: Home / Self Care | Payer: PPO | Attending: Physician Assistant | Admitting: Physician Assistant

## 2020-06-14 DIAGNOSIS — Z8742 Personal history of other diseases of the female genital tract: Secondary | ICD-10-CM

## 2020-06-14 DIAGNOSIS — N946 Dysmenorrhea, unspecified: Secondary | ICD-10-CM

## 2020-06-14 DIAGNOSIS — R102 Pelvic and perineal pain: Secondary | ICD-10-CM

## 2020-06-14 DIAGNOSIS — N926 Irregular menstruation, unspecified: Secondary | ICD-10-CM

## 2020-06-16 ENCOUNTER — Telehealth: Payer: Self-pay

## 2020-06-16 NOTE — Telephone Encounter (Signed)
Pt called looking to schedule an appt. She was seen at [facility] last Novermber 2021. She will have her records transfer over. She does not have a preferred MD.

## 2020-06-17 ENCOUNTER — Emergency Department (HOSPITAL_COMMUNITY): Payer: PPO

## 2020-06-17 ENCOUNTER — Observation Stay (HOSPITAL_COMMUNITY)
Admission: EM | Admit: 2020-06-17 | Discharge: 2020-06-18 | Disposition: A | Discharge status: Home / Self Care | Payer: PPO | Attending: Family Medicine | Admitting: Family Medicine

## 2020-06-17 ENCOUNTER — Other Ambulatory Visit: Payer: Self-pay

## 2020-06-17 DIAGNOSIS — Z20822 Contact with and (suspected) exposure to covid-19: Secondary | ICD-10-CM | POA: Diagnosis not present

## 2020-06-17 DIAGNOSIS — J45909 Unspecified asthma, uncomplicated: Secondary | ICD-10-CM | POA: Insufficient documentation

## 2020-06-17 DIAGNOSIS — Z79899 Other long term (current) drug therapy: Secondary | ICD-10-CM | POA: Insufficient documentation

## 2020-06-17 DIAGNOSIS — R110 Nausea: Secondary | ICD-10-CM

## 2020-06-17 DIAGNOSIS — N800 Endometriosis of uterus: Secondary | ICD-10-CM | POA: Insufficient documentation

## 2020-06-17 DIAGNOSIS — K920 Hematemesis: Principal | ICD-10-CM | POA: Diagnosis present

## 2020-06-17 DIAGNOSIS — Z7901 Long term (current) use of anticoagulants: Secondary | ICD-10-CM | POA: Insufficient documentation

## 2020-06-17 DIAGNOSIS — Z91010 Allergy to peanuts: Secondary | ICD-10-CM | POA: Insufficient documentation

## 2020-06-17 DIAGNOSIS — F419 Anxiety disorder, unspecified: Secondary | ICD-10-CM

## 2020-06-17 DIAGNOSIS — M329 Systemic lupus erythematosus, unspecified: Secondary | ICD-10-CM | POA: Diagnosis not present

## 2020-06-17 DIAGNOSIS — G4700 Insomnia, unspecified: Secondary | ICD-10-CM | POA: Diagnosis not present

## 2020-06-17 DIAGNOSIS — N809 Endometriosis, unspecified: Secondary | ICD-10-CM | POA: Diagnosis present

## 2020-06-17 LAB — COMPREHENSIVE METABOLIC PANEL
ALT: 15 U/L (ref 0–44)
AST: 14 U/L — ABNORMAL LOW (ref 15–41)
Albumin: 3.7 g/dL (ref 3.5–5.0)
Alkaline Phosphatase: 70 U/L (ref 38–126)
Anion gap: 8 (ref 5–15)
BUN: 10 mg/dL (ref 6–20)
CO2: 24 mmol/L (ref 22–32)
Calcium: 9.3 mg/dL (ref 8.9–10.3)
Chloride: 106 mmol/L (ref 98–111)
Creatinine, Ser: 0.83 mg/dL (ref 0.44–1.00)
GFR, Estimated: >60 mL/min (ref >60)
Glucose, Bld: 95 mg/dL (ref 70–99)
Potassium: 3.9 mmol/L (ref 3.5–5.1)
Sodium: 138 mmol/L (ref 135–145)
Total Bilirubin: 0.3 mg/dL (ref 0.3–1.2)
Total Protein: 6.7 g/dL (ref 6.5–8.1)

## 2020-06-17 LAB — CBC WITH DIFFERENTIAL/PLATELET
Abs Immature Granulocytes: 0.01 10*3/uL (ref 0.00–0.07)
Basophils Absolute: 0 10*3/uL (ref 0.0–0.1)
Basophils Relative: 1 %
Eosinophils Absolute: 0.2 10*3/uL (ref 0.0–0.5)
Eosinophils Relative: 3 %
HCT: 35.2 % — ABNORMAL LOW (ref 36.0–46.0)
Hemoglobin: 10.6 g/dL — ABNORMAL LOW (ref 12.0–15.0)
Immature Granulocytes: 0 %
Lymphocytes Relative: 58 %
Lymphs Abs: 3.8 10*3/uL (ref 0.7–4.0)
MCH: 21.7 pg — ABNORMAL LOW (ref 26.0–34.0)
MCHC: 30.1 g/dL (ref 30.0–36.0)
MCV: 72.1 fL — ABNORMAL LOW (ref 80.0–100.0)
Monocytes Absolute: 0.4 10*3/uL (ref 0.1–1.0)
Monocytes Relative: 6 %
Neutro Abs: 2.1 10*3/uL (ref 1.7–7.7)
Neutrophils Relative %: 32 %
Platelets: 411 10*3/uL — ABNORMAL HIGH (ref 150–400)
RBC: 4.88 MIL/uL (ref 3.87–5.11)
RDW: 17.5 % — ABNORMAL HIGH (ref 11.5–15.5)
WBC: 6.6 10*3/uL (ref 4.0–10.5)
nRBC: 0 % (ref 0.0–0.2)

## 2020-06-17 LAB — TYPE AND SCREEN
ABO/RH(D): AB POS
Antibody Screen: NEGATIVE

## 2020-06-17 LAB — LIPASE, BLOOD: Lipase: 35 U/L (ref 11–51)

## 2020-06-17 LAB — I-STAT BETA HCG BLOOD, ED (MC, WL, AP ONLY): I-stat hCG, quantitative: <5 m[IU]/mL (ref <5)

## 2020-06-17 LAB — RESP PANEL BY RT-PCR (FLU A&B, COVID) ARPGX2
Influenza A by PCR: NEGATIVE
Influenza B by PCR: NEGATIVE
SARS Coronavirus 2 by RT PCR: NEGATIVE

## 2020-06-17 LAB — PROTIME-INR
INR: 0.9 (ref 0.8–1.2)
Prothrombin Time: 12.6 seconds (ref 11.4–15.2)

## 2020-06-17 MED ORDER — SERTRALINE HCL 100 MG PO TABS
200.0000 mg | ORAL_TABLET | Freq: Every day | ORAL | Status: DC
  Administered 2020-06-17: 200 mg via ORAL
  Filled 2020-06-17: qty 2

## 2020-06-17 MED ORDER — HYDROXYCHLOROQUINE SULFATE 200 MG PO TABS
400.0000 mg | ORAL_TABLET | Freq: Every day | ORAL | Status: DC
  Administered 2020-06-17: 400 mg via ORAL
  Filled 2020-06-17: qty 2

## 2020-06-17 MED ORDER — ONDANSETRON HCL 4 MG/2ML IJ SOLN
4.0000 mg | Freq: Once | INTRAMUSCULAR | Status: AC
  Administered 2020-06-17: 4 mg via INTRAVENOUS
  Filled 2020-06-17: qty 2

## 2020-06-17 MED ORDER — PANTOPRAZOLE SODIUM 40 MG IV SOLR
40.0000 mg | Freq: Every day | INTRAVENOUS | Status: DC
  Administered 2020-06-17: 40 mg via INTRAVENOUS
  Filled 2020-06-17: qty 40

## 2020-06-17 MED ORDER — ZOLPIDEM TARTRATE 5 MG PO TABS
10.0000 mg | ORAL_TABLET | Freq: Every evening | ORAL | Status: DC | PRN
Start: 2020-06-17 — End: 2020-06-19
  Administered 2020-06-17: 10 mg via ORAL
  Filled 2020-06-17: qty 2

## 2020-06-17 MED ORDER — OXYCODONE HCL 5 MG PO TABS
5.0000 mg | ORAL_TABLET | ORAL | Status: DC | PRN
  Administered 2020-06-17 (×2): 5 mg via ORAL
  Filled 2020-06-17 (×4): qty 1

## 2020-06-17 MED ORDER — HYDROMORPHONE HCL 1 MG/ML IJ SOLN
0.5000 mg | Freq: Once | INTRAMUSCULAR | Status: AC
  Administered 2020-06-17: 0.5 mg via INTRAVENOUS
  Filled 2020-06-17: qty 1

## 2020-06-17 MED ORDER — ACETAMINOPHEN 650 MG RE SUPP: 650.0000 mg | Freq: Four times a day (QID) | RECTAL | Status: DC

## 2020-06-17 MED ORDER — HEPARIN SODIUM (PORCINE) 5000 UNIT/ML IJ SOLN
5000.0000 [IU] | Freq: Three times a day (TID) | INTRAMUSCULAR | Status: AC
  Administered 2020-06-17: 5000 [IU] via SUBCUTANEOUS
  Filled 2020-06-17: qty 1

## 2020-06-17 MED ORDER — ALBUTEROL SULFATE (2.5 MG/3ML) 0.083% IN NEBU: 2.5000 mg | INHALATION_SOLUTION | Freq: Four times a day (QID) | RESPIRATORY_TRACT | Status: DC | PRN

## 2020-06-17 MED ORDER — HEPARIN SODIUM (PORCINE) 5000 UNIT/ML IJ SOLN: 5000.0000 [IU] | Freq: Three times a day (TID) | INTRAMUSCULAR | Status: DC

## 2020-06-17 MED ORDER — ALBUTEROL SULFATE HFA 108 (90 BASE) MCG/ACT IN AERS: 2.0000 | INHALATION_SPRAY | Freq: Four times a day (QID) | RESPIRATORY_TRACT | Status: DC | PRN

## 2020-06-17 MED ORDER — ZOLPIDEM TARTRATE 5 MG PO TABS: 5.0000 mg | ORAL_TABLET | Freq: Every evening | ORAL | Status: DC | PRN

## 2020-06-17 MED ORDER — ACETAMINOPHEN 325 MG PO TABS
650.0000 mg | ORAL_TABLET | Freq: Four times a day (QID) | ORAL | Status: DC
  Administered 2020-06-18: 650 mg via ORAL
  Filled 2020-06-17 (×2): qty 2

## 2020-06-17 MED ORDER — NORETHINDRONE ACETATE 5 MG PO TABS
5.0000 mg | ORAL_TABLET | Freq: Every day | ORAL | Status: DC
  Administered 2020-06-18: 5 mg via ORAL
  Filled 2020-06-17 (×2): qty 1

## 2020-06-17 MED ORDER — DIPHENHYDRAMINE HCL 50 MG/ML IJ SOLN
25.0000 mg | Freq: Once | INTRAMUSCULAR | Status: AC
  Administered 2020-06-17: 25 mg via INTRAVENOUS
  Filled 2020-06-17: qty 1

## 2020-06-17 NOTE — Consult Note (Addendum)
[facility] Consult: 2:01 PM 06/17/2020  LOS: 0 days    Referring Provider: Dr Laverta Baltimore in [place] Physician:  Pcp, No Primary Gastroenterologist: Althia Forts patient.  Dr. Princella Pellegrini at [facility].  Previously seen in [place] by Dr. Juel Burrow at [facility] the [place].  Also seen by GI doctors in [place]. Ob-gyn: Maceo Pro at [facility]   Reason for Consultation:  Hematemesis.   HPI: [name] is a 25 y.o. female.  PMH PE, on Eliquis.  Panic disorder.  Lupus.  Endometriosis.  Carries diagnosis celiac disease but genetic testing was negative.  Asthma.  S/p appendectomy, excision endometrioma, cholecystectomy, ovarian cyst removal.  Acute hepatitis A 2015.  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  Bone marrow bx 08/2013: Normochromic, mildly microcytic RBCs.  Adequate WBCs, platelets.  COVID-19 positive in February 2022.  Long history of abdominal pain dating back to 2014.  Multiple CTs, xrays.     08/2012 EGD.  At [facility] in [place].  Grossly normal esophagus, stomach, examined duodenum. Duodenum biopsied, path: normal duodenal mucosa. 2019 EGD revealed hiatal hernia, esophagitis. 12/05/2018 colonoscopy.  For diarrhea.  Dr. Juel Burrow in [place] random biopsies negative for microscopic colitis. (Unable to access procedure or pathology reports.) 11/2019 colonoscopy.  Dr. Alan Ripper at [place].  No colitis.  Apparently unremarkable study but unable to access report 11/2019 EGD by Dr. Alan Ripper.  Was not advised there is any significant pathology.  Again unable to access reports 01/02/2020 CTAP without contrast/stone protocol.  Showed stable small subcentimeter hypoattenuating lesions in the hepatic dome, too small to characterize.  Pancreas, spleen, biliary tree, small bowel, colon,  stomach unremarkable 02/07/2020 CTAP with contrast at the [facility] in [place].  For evaluation of abdominal, flank pain.  9 mm cystic lesion in the liver.  Pancreas, spleen, stomach, SPN: Unremarkable.  Right main pulmonary artery filling defect consistent with PE. H pylori negative 11/2018.  Marland KitchenESR normal 04/2018.   Regarding celiac testing:  11/2018 TTG IgA was < 2.  IgA was normal at 136.  TTG IgG and IgA Abs < 3 in 08/2012. Endomysial Abs negative in 08/2018.  08/2011 celiac disease report at the [facility] 08/2011:  DQ2: (DQB1*02) is Negative        DQB1*02:01 or 02:02 is Not Present        DQA1*05:01 or 05:05 is Not Present  DQ8: (DQB1*03:02) is Negative        DQB1*03:02, DQA1*03 are Not Present    Chronic pelvic and abdominal pain, menorrhagia.  Frequent visits to the ED in [place].  Difficulty undergoing pelvic ultrasounds and exams due to PTSD.   CTAP's x >/=3 this year, unrevealing as to source of abdominal pain, nausea, vomiting. 05/30/2020 CTAP w contrast showed moderate burden of stool in the colon but no acute or chronic abnormalities.  Pelvic imaging likewise unremarkable.  Says she is waiting on an MRI for further evaluation of her pain, ordered by GYN. 06/08/2020 pelvic ultrasound: Limited study as patient unable to tolerate transvaginal imaging but normal-appearing  uterus.  No pelvic free fluid and ovaries not identified. 06/14/2020 MR pelvis: No acute pelvic abnormalities.  No focal endometrioma or imaging of deep infiltrative endometriosis.  ED visit to [facility] then to [place] on 5/22, 5/23/5/24 with hematemesis.  Additionally stabbing abdominal pain and barely able to walk because of this.  She left ED without being seen due to long wait times on multiple occasions.  At [facility] emergency department on 5/23 she had abdominal tenderness on exam.  Hb 9.7, MCV 70.  Nausea, vomiting resolved.  Dr. Goldstein  discussed the case with Dr. Buccini who agreed to see her if needed.  However she was stable and there was no observed hematemesis in the ED so she discharged home.  Given prescription for oxycodone. The trigger for her nausea and vomiting is pelvic pain.  When it gets severe she starts vomiting.  Emesis is bilious with blood mixed in.  Does not see a lot of blood but will consistency see blood.  She is not taking any acid controlling medications, not taking NSAIDs or aspirin products.  Her GYN plans some sort of surgery for the endometriosis, however wants her to undergo endoscopy before she pursues surgery.  Patient contacted [name] office for appointment but has not heard back.  Now returns to [place] today with hematemesis.  Hgb 10.6, was 9.7 4 days ago.  Baseline is anywhere from 10 to 11.  MCV 72 consistent with prior.  INR 0.9. Normal renal function, LFTs, lipase as of 5/23 and on multiple previous occasions this month.  No electrolyte disturbances other than potassium 3.4 at ED visit on 5/23.  Awaiting repeat chemistries Vital signs with stable blood pressures, pulse as high as 119 but generally in the 80s to 90s.  Excellent room air sats.   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  Her family lives in [place].  No drugs or alcohol Family history pertinent for cousins with "colitis".     Past Medical History:  Diagnosis Date  . Asthma   . Celiac disease   . Collagen vascular disease (HCC)   . Diarrhea    Patient mentions diagnosis of ulcerative colitis but it is not clear she actually has UC  . Endometriosis   . Lupus (HCC)   . Ovarian cyst   . Panic disorder 05/30/2020  . Pulmonary embolus (HCC) 02/06/2020    Past Surgical History:  Procedure Laterality Date  . APPENDECTOMY    . CHOLECYSTECTOMY    . EXCISION OF ENDOMETRIOMA     Ovarian cyst removal  . excision of endometriosis    . FOOT FRACTURE SURGERY     w/ hardware  . HERNIA REPAIR    . TONSILLECTOMY      Prior  to Admission medications   Medication Sig Start Date End Date Taking? Authorizing Provider  ALPRAZolam (XANAX) 1 MG tablet Take 1.5 tablets by mouth 2 (two) times daily as needed. 06/02/20   [provider]  apixaban (ELIQUIS) 5 MG TABS tablet Take 5 mg by mouth 2 (two) times daily.    [provider]  EPINEPHrine 0.3 mg/0.3 mL IJ SOAJ injection Inject 0.3 mg into the muscle daily as needed (Allergic Reaction). 06/01/20   Nwoko, Agnes I, NP  fluticasone (FLONASE) 50 MCG/ACT nasal spray Place 1 spray into both nostrils daily as needed for allergies or rhinitis.    [provider]  hydroxychloroquine (PLAQUENIL) 200 MG tablet Take 2 tablets (400 mg total) by mouth daily.   Patient taking differently: Take 400 mg by mouth at bedtime. 03/23/20   Rice, Resa Miner, MD  Norethindrone-Ethinyl Estradiol-Fe Biphas (LO LOESTRIN FE) 1 MG-10 MCG / 10 MCG tablet Take 1 tablet by mouth daily. 05/11/20 08/09/20  Wieters, Hallie C, PA-C  oxyCODONE (ROXICODONE) 5 MG immediate release tablet Take 1 tablet (5 mg total) by mouth every 4 (four) hours as needed for severe pain. 06/13/20   Sherwood Gambler, MD  promethazine (PHENERGAN) 25 MG tablet Take 1 tablet (25 mg total) by mouth every 6 (six) hours as needed for nausea or vomiting. 06/06/20   Carlisle Cater, PA-C  promethazine (PHENERGAN) 25 MG tablet Take 1 tablet (25 mg total) by mouth every 8 (eight) hours as needed for up to 20 doses for nausea or vomiting. 06/08/20   Curatolo, Adam, DO  sertraline (ZOLOFT) 100 MG tablet Take 2 tablets (200 mg total) by mouth at bedtime. For depression 06/01/20   Lindell Spar I, NP  zolpidem (AMBIEN) 5 MG tablet Take 1 tablet (5 mg total) by mouth at bedtime as needed for sleep. Patient taking differently: Take 20 mg by mouth at bedtime as needed for sleep. 06/01/20   Lindell Spar I, NP  dicyclomine (BENTYL) 20 MG tablet Take 1 tablet (20 mg total) by mouth 2 (two) times daily. 11/29/18 04/11/19  Muthersbaugh, Jarrett Soho,  PA-C  Fluticasone Propionate HFA (FLOVENT HFA IN) Inhale into the lungs. Patient not taking: Reported on 11/14/2019  11/14/19  [provider]    Scheduled Meds:  Infusions:  PRN Meds:    Allergies as of 06/17/2020 - Review Complete 06/17/2020  Allergen Reaction Noted  . Azithromycin Anaphylaxis 11/28/2018  . Peanut-containing drug products Other (See Comments) 11/28/2018  . Iodinated diagnostic agents Hives and Itching 11/13/2019  . Other Other (See Comments) 10/22/2019  . Amoxicillin Swelling 11/28/2018  . Fish allergy Other (See Comments) 11/28/2018  . Gluten meal Other (See Comments) 12/30/2019  . Hydromet [hydrocodone bit-homatrop mbr] Nausea And Vomiting 04/04/2020  . Ibuprofen Nausea And Vomiting 09/06/2011  . Lactose intolerance (gi)  05/30/2020  . Morphine and related  06/12/2020  . Pantoprazole Other (See Comments) 11/28/2018    Family History  Problem Relation Age of Onset  . Hypertension Mother   . Hypercholesterolemia Mother   . Rheum arthritis Mother   . Diabetes Father   . Hypertension Father   . Cancer Father   . Autism Brother     Social History   Socioeconomic History  . Marital status: Single    Spouse name: Not on file  . Number of children: Not on file  . Years of education: Not on file  . Highest education level: Not on file  Occupational History  . Not on file  Tobacco Use  . Smoking status: Never Smoker  . Smokeless tobacco: Never Used  Vaping Use  . Vaping Use: Never used  Substance and Sexual Activity  . Alcohol use: Never  . Drug use: Never  . Sexual activity: Not on file  Other Topics Concern  . Not on file  Social History Narrative  . Not on file   Social Determinants of Health   Financial Resource Strain: Not on file  Food Insecurity: Not on file  Transportation Needs: Not on file  Physical Activity: Not on file  Stress: Not on file  Social Connections: Not on file  Intimate Partner Violence: Not on file     REVIEW OF SYSTEMS: Constitutional: Some weakness when she has been vomiting for a  while but not in general. ENT:  No nose bleeds Pulm: No shortness of breath or cough CV:  No palpitations, no LE edema.  GU:  No hematuria, no frequency GI: See HPI. Heme: Other than the hematemesis and menorrhagia, no unusual bleeding or bruising. Transfusions: None Neuro:  No headaches, no peripheral tingling or numbness.  No seizures, no syncope Derm:  No itching, no rash or sores.  Endocrine:  No sweats or chills.  No polyuria or dysuria Immunization: Reviewed. Travel:  None beyond local counties in last few months.    PHYSICAL EXAM: Vital signs in last 24 hours: Vitals:   06/17/20 1300 06/17/20 1330  BP: 124/86 (!) 132/97  Pulse: 98 95  Resp: 17 17  Temp:    SpO2: 97% 96%   Wt Readings from Last 3 Encounters:  06/17/20 108.9 kg  06/13/20 97.1 kg  06/12/20 113.4 kg    General: Morbidly obese, BMI 43.9.  In no distress.  Able to provide good history. Head: No facial asymmetry or swelling.  No signs of head trauma. Eyes: Conjunctiva pink.  No scleral icterus. Ears: Not hard of hearing Nose: No congestion or discharge Mouth: Oropharynx moist, pink, clear.  Tongue midline. Neck: No JVD, no masses.   Lungs: Clear bilaterally.  No labored breathing.  No cough Heart: RRR.  No MRG. Abdomen: Obese.  Soft.  Not tender.  No distention.  Active bowel sounds.  Do not appreciate organomegaly, bruits, hernias.   Rectal: Deferred Musc/Skeltl: No joint redness, swelling or gross deformity. Extremities: No CCE Neurologic: Alert.  Oriented x3.  Moves all 4 limbs. Skin: No rash, no tears, no suspicious lesions. Nodes: No cervical adenopathy Psych: Pleasant, calm, cooperative.  Intake/Output from previous day: No intake/output data recorded. Intake/Output this shift: No intake/output data recorded.  LAB RESULTS: Recent Labs    06/17/20 1245  WBC 6.6  HGB 10.6*  HCT 35.2*  PLT 411*    BMET Lab Results  Component Value Date   NA 142 06/13/2020   NA 139 06/12/2020   NA 140 06/10/2020   K 3.4 (L) 06/13/2020   K 3.5 06/12/2020   K 3.3 (L) 06/10/2020   CL 109 06/13/2020   CL 108 06/12/2020   CL 109 06/10/2020   CO2 26 06/13/2020   CO2 24 06/12/2020   CO2 20 (L) 06/10/2020   GLUCOSE 91 06/13/2020   GLUCOSE 96 06/12/2020   GLUCOSE 146 (H) 06/10/2020   BUN 7 06/13/2020   BUN 8 06/12/2020   BUN 7 06/10/2020   CREATININE 0.84 06/13/2020   CREATININE 0.82 06/12/2020   CREATININE 0.75 06/10/2020   CALCIUM 8.9 06/13/2020   CALCIUM 8.6 (L) 06/12/2020   CALCIUM 9.0 06/10/2020   LFT No results for input(s): PROT, ALBUMIN, AST, ALT, ALKPHOS, BILITOT, BILIDIR, IBILI in the last 72 hours. PT/INR Lab Results  Component Value Date   INR 0.9 06/17/2020   INR 1.1 05/28/2020   Hepatitis Panel No results for input(s): HEPBSAG, HCVAB, HEPAIGM, HEPBIGM in the last 72 hours. C-Diff No components found for: CDIFF Lipase     Component Value Date/Time   LIPASE 45 06/13/2020 1412    Drugs of Abuse     Component Value Date/Time   LABOPIA NONE DETECTED 05/28/2020 1700   COCAINSCRNUR NONE DETECTED 05/28/2020 1700   LABBENZ POSITIVE (A) 05/28/2020 1700   AMPHETMU NONE DETECTED 05/28/2020 1700   THCU NONE DETECTED 05/28/2020 1700   LABBARB NONE DETECTED 05/28/2020 1700     RADIOLOGY STUDIES:  DG Abdomen Acute W/Chest  Result Date: 06/17/2020 CLINICAL DATA:  Chest pain EXAM: DG ABDOMEN ACUTE WITH 1 VIEW CHEST COMPARISON:  06/13/2020 FINDINGS: There is no evidence of dilated bowel loops or free intraperitoneal air. No radiopaque calculi or other significant radiographic abnormality is seen. Heart size and mediastinal contours are within normal limits. Both lungs are clear. IMPRESSION: Negative abdominal radiographs.  No acute cardiopulmonary disease. Electronically Signed   By: Miachel Roux M.D.   On: 06/17/2020 13:00      IMPRESSION:   *   Hematemesis.  Vomiting  triggered by pelvic pain. Rule out retching gastropathy, esophagitis, Mallory-Weiss tear.  *   Acute on chronic microcytic anemia in patient with menorrhagia. Reassuringly stable Hgb.    *    chronic pelvic pain.  Dr. Otho Perl is planning some sort of investigative surgery though no strong evidence for significant endometriosis on recent imaging.  *    ED/hospital/provider shopping.  Multiple visits to local ED's in recent months as well as last year.  *   Hx PE 01/2020. chronic Eliquis. No PE on CT angio as of February and March 2022.   PLAN:     *   EGD inpt vs discharge home on anti-emetics, PPI and fup w Dr Alan Ripper.   If inpt EGD, will need Covid 19 testing.    *   Please admit to observation through the hospitalist.  Hold the Eliquis and plan EGD depending on the timing of her last dose of Eliquis.   Azucena Freed  06/17/2020, 2:01 PM Phone [phone]  I agree that this patient's primary problem appears to be chronic pelvic pain which, when it worsens, causes this patient to vomit and then have hematemesis.  She does not have brisk GI bleeding, her hemoglobin is at baseline vital signs stable.  She looks well, and has some lower abdominal tenderness without guarding or rebound. I saw her in the ED in the presence of the family medicine resident and her nurse.  From what I understand, this patient's gynecologist would like an upper endoscopic evaluation for risk assessment prior to operation for reported endometriosis.  Patient's last dose of Eliquis was yesterday evening (or perhaps the day before, since she cannot be certain she even took it yesterday). Thus, she will be admitted for observation and have an upper endoscopy tomorrow.  Risk and benefits were reviewed.  The benefits and risks of the planned procedure were described in detail with the patient or (when appropriate) their health care proxy.  Risks were outlined as including, but not limited to, bleeding, infection,  perforation, adverse medication reaction leading to cardiac or pulmonary decompensation, pancreatitis (if ERCP).  The limitation of incomplete mucosal visualization was also discussed.  No guarantees or warranties were given.  Patient at increased risk for cardiopulmonary complications of procedure due to medical comorbidities.  If low risk findings, patient can most likely be discharged home later tomorrow.

## 2020-06-17 NOTE — H&P (Addendum)
[facility] Admission History and Physical Service Pager: [phone]  Patient name: [name] Medical record number: [id number] Date of birth: Jan 02, 1996 Age: 25 y.o. Gender: female  Primary Care Provider: Pcp, No Consultants: GI Code Status: Full Preferred Emergency Contact: Mother  Chief Complaint: Hematemesis  Assessment and Plan: [name] is a 25 y.o. female presenting with Hematemesis . PMH is significant for SLE, Lupus Anticoagulant, Endometriosis, GAD, Panic Disorder,   Hematemesis Patient reports longstanding history of hematemesis but reports that this morning she had frank blood which was more than she had ever seen before.. No repeat episodes since arrival.  Thought likely due to Esophagitis from vomiting in the setting of Eliquis usage.  Denies Hematochazia or Melena.  VSS.  Hgb- 10.6 which is baseline.  No concern for active bleed.  PT- 12.6.  INR- 0.9.  Lipase- 35.  GI following.  Plan for EGD tomorrow.   - Admit to inpatient, Attending Dr Nori Riis - GI following, appreciate recs -holding Eliquis at this time for EGD 5/28 - VS q4hr   -Clear liquid diet tonight, n.p.o. 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  Eliquis 5 mg BID.  Had 2 PE's.  Last in January of this year following procedure.   - Continue Plaquenil daily - Hold Eliquis in setting of bleed - Will give 1 dose of Heparin subQ tonight - SCD's starting tomorrow  Endometriosis Reports currently having flair up.  Indicates pain causes lightheadedness and vomiting.  Was prescribed Percocet 5-325 mg q4hr for past 3 days. - Schedule Tylenol 650 mg  - Oxycodone 5 mg q4hr PRN for breakthrough pain  Anxiety/Panic Disorder On Zoloft 100 mg daily.  Prescribed Xanax TID for panic disorder.  Indicates only uses this every few weeks, though does get refilled monthly.  Patient reports that her last use of Xanax was 5 days ago.   She reports she does not take it because she is afraid if she gets addicted she will withdrawal. - Continue Zoloft 100 mg - Hold Xanax 1 mg - CIWA protocol to monitor for benzo withdrawal  Insomnia On Ambien 20 mg at bedtime. - Start Ambien 10 mg qhs  FEN/GI: Clear liquids, then NPO after midnight Prophylaxis: One dose SubQ Hep, then SCD's  Disposition: Med/Tele  History of Present Illness:  [name] is a 25 y.o. female presenting with episode of hematemesis at home.  Has had episodes with streaks of blood previously, but never frank blood like this episode.  Indictates usually gets hematemesis with Endometriosis flairs.  Has also been on Eliquis for PE in since January.    Review Of Systems: Per HPI with the following additions:   Review of Systems  Constitutional: Negative for fever.  Respiratory: Negative for shortness of breath.   Cardiovascular: Negative for chest pain.  Gastrointestinal: Negative for nausea and vomiting.  Genitourinary: Negative for difficulty urinating.  Neurological: Positive for weakness.     Patient Active Problem List   Diagnosis Date Noted  . MDD (major depressive disorder), recurrent severe, without psychosis ([place]) 05/30/2020  . Panic disorder 05/30/2020  . Major depressive disorder 05/29/2020  . Suicide attempt by acetaminophen overdose ([place]) 05/29/2020  . Obesity, Class III, BMI 40-49.9 (morbid obesity) ([place]) 04/05/2020  . SOB (shortness of breath) 04/05/2020  . Abdominal pain 04/04/2020  . SLE (systemic lupus erythematosus related syndrome) ([place]) 04/04/2020  . Shortness of breath 04/04/2020  .  Pneumonia 03/23/2020  . Chronic nausea 03/23/2020  . Asthma 03/22/2020  . Dysuria 02/03/2020  . Hematuria 02/03/2020  . Mass of urinary bladder 01/26/2020  . Allergic rhinitis 05/05/2019  . 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 Endometriosis 03/14/2018  . Long-term use of high-risk medication 07/02/2017  . Lupus anticoagulant positive  07/02/2017  . Personal history of pulmonary embolism 07/02/2017  . Other forms of systemic lupus erythematosus ([place]) 03/12/2017  . Anemia 02/28/2017  . Migraine 02/28/2017  . Dysmenorrhea 08/28/2011  . Gastritis 08/28/2011    Past Medical History: Past Medical History:  Diagnosis Date  . Asthma   . Celiac disease   . Collagen vascular disease ([place])   . Diarrhea    Patient mentions diagnosis of ulcerative colitis but it is not clear she actually has UC  . Endometriosis   . Lupus ([place])   . Ovarian cyst   . Panic disorder 05/30/2020  . Pulmonary embolus ([place]) 02/06/2020    Past Surgical History: Past Surgical History:  Procedure Laterality Date  . APPENDECTOMY    . CHOLECYSTECTOMY    . EXCISION OF ENDOMETRIOMA     Ovarian cyst removal  . excision of endometriosis    . FOOT FRACTURE SURGERY     w/ hardware  . HERNIA REPAIR    . TONSILLECTOMY      Social History: Social History   Tobacco Use  . Smoking status: Never Smoker  . Smokeless tobacco: Never Used  Vaping Use  . Vaping Use: Never used  Substance Use Topics  . Alcohol use: Never  . Drug use: Never   Additional social history:  Please also refer to relevant sections of EMR.  Family History: Family History  Problem Relation Age of Onset  . Hypertension Mother   . Hypercholesterolemia Mother   . Rheum arthritis Mother   . Diabetes Father   . Hypertension Father   . Cancer Father   . Autism Brother     Allergies and Medications: Allergies  Allergen Reactions  . Azithromycin Anaphylaxis  . Peanut-Containing Drug Products Other (See Comments)    Unknown ; treenuts, shell fish Unknown reaction, took allergy test  . Iodinated Diagnostic Agents Hives and Itching    02/04/2020 Patient stated has history of itching and hives with CT IV Iodine contrast, premedicated with Benadryl 25 mg IV prior to CT IV Iodine scan today.Post CT scan patient reported itching, Benadryl 25 mg IV given, symptoms resolved, NP  recommends for patient to get premedicated with Benadryl 50 mg IV prior to future CT IV Iodine scans.   . Other Other (See Comments)    Moderna covid vaccine -Syncope   . Amoxicillin Swelling    Hand swelling   . Fish Allergy Other (See Comments)    Unknown Allergy test   . Gluten Meal Other (See Comments)    Celiac disease  . Hydromet [Hydrocodone Bit-Homatrop Mbr] Nausea And Vomiting  . Ibuprofen Nausea And Vomiting  . Lactose Intolerance (Gi)   . Morphine And Related     vomiting  . Pantoprazole Other (See Comments)    Stomach pain   No current facility-administered medications on file prior to encounter.   Current Outpatient Medications on File Prior to Encounter  Medication Sig Dispense Refill  . albuterol (VENTOLIN HFA) 108 (90 Base) MCG/ACT inhaler Inhale 2 puffs into the lungs every 6 (six) hours as needed for wheezing or shortness of breath.    . ALPRAZolam (XANAX) 1 MG tablet Take 1.5 tablets by mouth  2 (two) times daily.    Marland Kitchen apixaban (ELIQUIS) 5 MG TABS tablet Take 5 mg by mouth 2 (two) times daily.    Marland Kitchen EPINEPHrine 0.3 mg/0.3 mL IJ SOAJ injection Inject 0.3 mg into the muscle daily as needed (Allergic Reaction). 1 each 0  . hydroxychloroquine (PLAQUENIL) 200 MG tablet Take 2 tablets (400 mg total) by mouth daily. (Patient taking differently: Take 400 mg by mouth at bedtime.) 180 tablet 0  . Norethindrone-Ethinyl Estradiol-Fe Biphas (LO LOESTRIN FE) 1 MG-10 MCG / 10 MCG tablet Take 1 tablet by mouth daily. 90 tablet 0  . oxyCODONE-acetaminophen (PERCOCET/ROXICET) 5-325 MG tablet Take 1 tablet by mouth every 4 (four) hours as needed for severe pain.    . promethazine (PHENERGAN) 25 MG tablet Take 1 tablet (25 mg total) by mouth every 6 (six) hours as needed for nausea or vomiting. 8 tablet 0  . sertraline (ZOLOFT) 100 MG tablet Take 2 tablets (200 mg total) by mouth at bedtime. For depression 30 tablet 0  . zolpidem (AMBIEN) 10 MG tablet Take 20 mg by mouth at bedtime.    Marland Kitchen  oxyCODONE (ROXICODONE) 5 MG immediate release tablet Take 1 tablet (5 mg total) by mouth every 4 (four) hours as needed for severe pain. (Patient not taking: No sig reported) 6 tablet 0  . promethazine (PHENERGAN) 25 MG tablet Take 1 tablet (25 mg total) by mouth every 8 (eight) hours as needed for up to 20 doses for nausea or vomiting. 20 tablet 0  . zolpidem (AMBIEN) 5 MG tablet Take 1 tablet (5 mg total) by mouth at bedtime as needed for sleep. (Patient not taking: Reported on 06/17/2020) 5 tablet 0  . [DISCONTINUED] dicyclomine (BENTYL) 20 MG tablet Take 1 tablet (20 mg total) by mouth 2 (two) times daily. 20 tablet 0  . [DISCONTINUED] Fluticasone Propionate HFA (FLOVENT HFA IN) Inhale into the lungs. (Patient not taking: Reported on 11/14/2019)      Objective: BP 128/88   Pulse 98   Temp 99.2 F (37.3 C)   Resp 18   Ht 5' 2"  (1.575 m)   Wt 108.9 kg   LMP 06/06/2020   SpO2 98%   BMI 43.90 kg/m  Exam:  Physical Exam Constitutional:      Appearance: Normal appearance.  HENT:     Head: Normocephalic and atraumatic.     Mouth/Throat:     Mouth: Mucous membranes are moist.  Cardiovascular:     Rate and Rhythm: Normal rate and regular rhythm.  Pulmonary:     Effort: Pulmonary effort is normal.     Breath sounds: Normal breath sounds.  Abdominal:     General: Abdomen is flat.  Skin:    General: Skin is warm.  Neurological:     Mental Status: She is alert.     Labs and Imaging: CBC BMET  Recent Labs  Lab 06/17/20 1245  WBC 6.6  HGB 10.6*  HCT 35.2*  PLT 411*   Recent Labs  Lab 06/17/20 1245  NA 138  K 3.9  CL 106  CO2 24  BUN 10  CREATININE 0.83  GLUCOSE 95  CALCIUM 9.3      Delora Fuel, MD 06/17/2020, 3:06 PM PGY-1, [place] Intern pager: [phone], text pages welcome  FPTS Upper-Level Resident Addendum   I have independently interviewed and examined the patient. I have discussed the above with the original author and agree  with their documentation. .Please see also any attending notes.  Gifford Shave, MD PGY-2, [facility] 06/17/2020 8:12 PM  FPTS Service pager: [phone] (text pages welcome through [facility])

## 2020-06-17 NOTE — H&P (View-Only) (Signed)
[facility] Consult: 2:01 PM 06/17/2020  LOS: 0 days    Referring Provider: Dr Laverta Baltimore in [place] Physician:  Pcp, No Primary Gastroenterologist: Althia Forts patient.  Dr. Princella Pellegrini at [facility].  Previously seen in [place] by Dr. Juel Burrow at [facility] of the [place].  Also seen by GI doctors in [place]. Ob-gyn: Maceo Pro at [facility]   Reason for Consultation:  Hematemesis.   HPI: [name] is a 25 y.o. female.  PMH PE, on Eliquis.  Panic disorder.  Lupus.  Endometriosis.  Carries diagnosis celiac disease but genetic testing was negative.  Asthma.  S/p appendectomy, excision endometrioma, cholecystectomy, ovarian cyst removal.  Acute hepatitis A 2015.  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  Bone marrow bx 08/2013: Normochromic, mildly microcytic RBCs.  Adequate WBCs, platelets.  COVID-19 positive in February 2022.  Long history of abdominal pain dating back to 2014.  Multiple CTs, xrays.     08/2012 EGD.  At [facility] in [place].  Grossly normal esophagus, stomach, examined duodenum. Duodenum biopsied, path: normal duodenal mucosa. 2019 EGD revealed hiatal hernia, esophagitis. 12/05/2018 colonoscopy.  For diarrhea.  Dr. Juel Burrow in [place] random biopsies negative for microscopic colitis. (Unable to access procedure or pathology reports.) 11/2019 colonoscopy.  Dr. Alan Ripper at [place].  No colitis.  Apparently unremarkable study but unable to access report 11/2019 EGD by Dr. Alan Ripper.  Was not advised there is any significant pathology.  Again unable to access reports 01/02/2020 CTAP without contrast/stone protocol.  Showed stable small subcentimeter hypoattenuating lesions in the hepatic dome, too small to characterize.  Pancreas, spleen, biliary tree, small bowel, colon,  stomach unremarkable 02/07/2020 CTAP with contrast at the [facility] in [place].  For evaluation of abdominal, flank pain.  9 mm cystic lesion in the liver.  Pancreas, spleen, stomach, SPN: Unremarkable.  Right main pulmonary artery filling defect consistent with PE. H pylori negative 11/2018.  Marland KitchenESR normal 04/2018.   Regarding celiac testing:  11/2018 TTG IgA was < 2.  IgA was normal at 136.  TTG IgG and IgA Abs < 3 in 08/2012. Endomysial Abs negative in 08/2018.  08/2011 celiac disease report at the [place] 08/2011:  DQ2: (DQB1*02) is Negative        DQB1*02:01 or 02:02 is Not Present        DQA1*05:01 or 05:05 is Not Present  DQ8: (DQB1*03:02) is Negative        DQB1*03:02, DQA1*03 are Not Present    Chronic pelvic and abdominal pain, menorrhagia.  Frequent visits to the ED in [place].  Difficulty undergoing pelvic ultrasounds and exams due to PTSD.   CTAP's x >/=3 this year, unrevealing as to source of abdominal pain, nausea, vomiting. 05/30/2020 CTAP w contrast showed moderate burden of stool in the colon but no acute or chronic abnormalities.  Pelvic imaging likewise unremarkable.  Says she is waiting on an MRI for further evaluation of her pain, ordered by GYN. 06/08/2020 pelvic ultrasound: Limited study as patient unable to tolerate transvaginal imaging but normal-appearing  uterus.  No pelvic free fluid and ovaries not identified. 06/14/2020 MR pelvis: No acute pelvic abnormalities.  No focal endometrioma or imaging of deep infiltrative endometriosis.  ED visit to [facility] then to [facility] on 5/22, 5/23/5/24 with hematemesis.  Additionally stabbing abdominal pain and barely able to walk because of this.  She left ED without being seen due to long wait times on multiple occasions.  At [facility] emergency department on 5/23 she had abdominal tenderness on exam.  Hb 9.7, MCV 70.  Nausea, vomiting resolved.  Dr. Verner Chol  discussed the case with Dr. Cristina Gong who agreed to see her if needed.  However she was stable and there was no observed hematemesis in the ED so she discharged home.  Given prescription for oxycodone. The trigger for her nausea and vomiting is pelvic pain.  When it gets severe she starts vomiting.  Emesis is bilious with blood mixed in.  Does not see a lot of blood but will consistency see blood.  She is not taking any acid controlling medications, not taking NSAIDs or aspirin products.  Her GYN plans some sort of surgery for the endometriosis, however wants her to undergo endoscopy before she pursues surgery.  Patient contacted [place] office for appointment but has not heard back.  Now returns to [facility] ED today with hematemesis.  Hgb 10.6, was 9.7 4 days ago.  Baseline is anywhere from 10 to 11.  MCV 72 consistent with prior.  INR 0.9. Normal renal function, LFTs, lipase as of 5/23 and on multiple previous occasions this month.  No electrolyte disturbances other than potassium 3.4 at ED visit on 5/23.  Awaiting repeat chemistries Vital signs with stable blood pressures, pulse as high as 119 but generally in the 80s to 90s.  Excellent room air sats.   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  Her family lives in [place].  No drugs or alcohol Family history pertinent for cousins with "colitis".     Past Medical History:  Diagnosis Date  . Asthma   . Celiac disease   . Collagen vascular disease ([place])   . Diarrhea    Patient mentions diagnosis of ulcerative colitis but it is not clear she actually has UC  . Endometriosis   . Lupus ([place])   . Ovarian cyst   . Panic disorder 05/30/2020  . Pulmonary embolus ([place]) 02/06/2020    Past Surgical History:  Procedure Laterality Date  . APPENDECTOMY    . CHOLECYSTECTOMY    . EXCISION OF ENDOMETRIOMA     Ovarian cyst removal  . excision of endometriosis    . FOOT FRACTURE SURGERY     w/ hardware  . HERNIA REPAIR    . TONSILLECTOMY      Prior  to Admission medications   Medication Sig Start Date End Date Taking? Authorizing Provider  ALPRAZolam Duanne Moron) 1 MG tablet Take 1.5 tablets by mouth 2 (two) times daily as needed. 06/02/20   [provider]  apixaban (ELIQUIS) 5 MG TABS tablet Take 5 mg by mouth 2 (two) times daily.    [provider]  EPINEPHrine 0.3 mg/0.3 mL IJ SOAJ injection Inject 0.3 mg into the muscle daily as needed (Allergic Reaction). 06/01/20   Lindell Spar I, NP  fluticasone (FLONASE) 50 MCG/ACT nasal spray Place 1 spray into both nostrils daily as needed for allergies or rhinitis.    [provider]  hydroxychloroquine (PLAQUENIL) 200 MG tablet Take 2 tablets (400 mg total) by mouth daily.  Patient taking differently: Take 400 mg by mouth at bedtime. 03/23/20   Rice, Resa Miner, MD  Norethindrone-Ethinyl Estradiol-Fe Biphas (LO LOESTRIN FE) 1 MG-10 MCG / 10 MCG tablet Take 1 tablet by mouth daily. 05/11/20 08/09/20  Wieters, Hallie C, PA-C  oxyCODONE (ROXICODONE) 5 MG immediate release tablet Take 1 tablet (5 mg total) by mouth every 4 (four) hours as needed for severe pain. 06/13/20   Sherwood Gambler, MD  promethazine (PHENERGAN) 25 MG tablet Take 1 tablet (25 mg total) by mouth every 6 (six) hours as needed for nausea or vomiting. 06/06/20   Carlisle Cater, PA-C  promethazine (PHENERGAN) 25 MG tablet Take 1 tablet (25 mg total) by mouth every 8 (eight) hours as needed for up to 20 doses for nausea or vomiting. 06/08/20   Curatolo, Adam, DO  sertraline (ZOLOFT) 100 MG tablet Take 2 tablets (200 mg total) by mouth at bedtime. For depression 06/01/20   Lindell Spar I, NP  zolpidem (AMBIEN) 5 MG tablet Take 1 tablet (5 mg total) by mouth at bedtime as needed for sleep. Patient taking differently: Take 20 mg by mouth at bedtime as needed for sleep. 06/01/20   Lindell Spar I, NP  dicyclomine (BENTYL) 20 MG tablet Take 1 tablet (20 mg total) by mouth 2 (two) times daily. 11/29/18 04/11/19  Muthersbaugh, Jarrett Soho,  PA-C  Fluticasone Propionate HFA (FLOVENT HFA IN) Inhale into the lungs. Patient not taking: Reported on 11/14/2019  11/14/19  [provider]    Scheduled Meds:  Infusions:  PRN Meds:    Allergies as of 06/17/2020 - Review Complete 06/17/2020  Allergen Reaction Noted  . Azithromycin Anaphylaxis 11/28/2018  . Peanut-containing drug products Other (See Comments) 11/28/2018  . Iodinated diagnostic agents Hives and Itching 11/13/2019  . Other Other (See Comments) 10/22/2019  . Amoxicillin Swelling 11/28/2018  . Fish allergy Other (See Comments) 11/28/2018  . Gluten meal Other (See Comments) 12/30/2019  . Hydromet [hydrocodone bit-homatrop mbr] Nausea And Vomiting 04/04/2020  . Ibuprofen Nausea And Vomiting 09/06/2011  . Lactose intolerance (gi)  05/30/2020  . Morphine and related  06/12/2020  . Pantoprazole Other (See Comments) 11/28/2018    Family History  Problem Relation Age of Onset  . Hypertension Mother   . Hypercholesterolemia Mother   . Rheum arthritis Mother   . Diabetes Father   . Hypertension Father   . Cancer Father   . Autism Brother     Social History   Socioeconomic History  . Marital status: Single    Spouse name: Not on file  . Number of children: Not on file  . Years of education: Not on file  . Highest education level: Not on file  Occupational History  . Not on file  Tobacco Use  . Smoking status: Never Smoker  . Smokeless tobacco: Never Used  Vaping Use  . Vaping Use: Never used  Substance and Sexual Activity  . Alcohol use: Never  . Drug use: Never  . Sexual activity: Not on file  Other Topics Concern  . Not on file  Social History Narrative  . Not on file   Social Determinants of Health   Financial Resource Strain: Not on file  Food Insecurity: Not on file  Transportation Needs: Not on file  Physical Activity: Not on file  Stress: Not on file  Social Connections: Not on file  Intimate Partner Violence: Not on file     REVIEW OF SYSTEMS: Constitutional: Some weakness when she has been vomiting for a  while but not in general. ENT:  No nose bleeds Pulm: No shortness of breath or cough CV:  No palpitations, no LE edema.  GU:  No hematuria, no frequency GI: See HPI. Heme: Other than the hematemesis and menorrhagia, no unusual bleeding or bruising. Transfusions: None Neuro:  No headaches, no peripheral tingling or numbness.  No seizures, no syncope Derm:  No itching, no rash or sores.  Endocrine:  No sweats or chills.  No polyuria or dysuria Immunization: Reviewed. Travel:  None beyond local counties in last few months.    PHYSICAL EXAM: Vital signs in last 24 hours: Vitals:   06/17/20 1300 06/17/20 1330  BP: 124/86 (!) 132/97  Pulse: 98 95  Resp: 17 17  Temp:    SpO2: 97% 96%   Wt Readings from Last 3 Encounters:  06/17/20 108.9 kg  06/13/20 97.1 kg  06/12/20 113.4 kg    General: Morbidly obese, BMI 43.9.  In no distress.  Able to provide good history. Head: No facial asymmetry or swelling.  No signs of head trauma. Eyes: Conjunctiva pink.  No scleral icterus. Ears: Not hard of hearing Nose: No congestion or discharge Mouth: Oropharynx moist, pink, clear.  Tongue midline. Neck: No JVD, no masses.   Lungs: Clear bilaterally.  No labored breathing.  No cough Heart: RRR.  No MRG. Abdomen: Obese.  Soft.  Not tender.  No distention.  Active bowel sounds.  Do not appreciate organomegaly, bruits, hernias.   Rectal: Deferred Musc/Skeltl: No joint redness, swelling or gross deformity. Extremities: No CCE Neurologic: Alert.  Oriented x3.  Moves all 4 limbs. Skin: No rash, no tears, no suspicious lesions. Nodes: No cervical adenopathy Psych: Pleasant, calm, cooperative.  Intake/Output from previous day: No intake/output data recorded. Intake/Output this shift: No intake/output data recorded.  LAB RESULTS: Recent Labs    06/17/20 1245  WBC 6.6  HGB 10.6*  HCT 35.2*  PLT 411*    BMET Lab Results  Component Value Date   NA 142 06/13/2020   NA 139 06/12/2020   NA 140 06/10/2020   K 3.4 (L) 06/13/2020   K 3.5 06/12/2020   K 3.3 (L) 06/10/2020   CL 109 06/13/2020   CL 108 06/12/2020   CL 109 06/10/2020   CO2 26 06/13/2020   CO2 24 06/12/2020   CO2 20 (L) 06/10/2020   GLUCOSE 91 06/13/2020   GLUCOSE 96 06/12/2020   GLUCOSE 146 (H) 06/10/2020   BUN 7 06/13/2020   BUN 8 06/12/2020   BUN 7 06/10/2020   CREATININE 0.84 06/13/2020   CREATININE 0.82 06/12/2020   CREATININE 0.75 06/10/2020   CALCIUM 8.9 06/13/2020   CALCIUM 8.6 (L) 06/12/2020   CALCIUM 9.0 06/10/2020   LFT No results for input(s): PROT, ALBUMIN, AST, ALT, ALKPHOS, BILITOT, BILIDIR, IBILI in the last 72 hours. PT/INR Lab Results  Component Value Date   INR 0.9 06/17/2020   INR 1.1 05/28/2020   Hepatitis Panel No results for input(s): HEPBSAG, HCVAB, HEPAIGM, HEPBIGM in the last 72 hours. C-Diff No components found for: CDIFF Lipase     Component Value Date/Time   LIPASE 45 06/13/2020 1412    Drugs of Abuse     Component Value Date/Time   LABOPIA NONE DETECTED 05/28/2020 1700   COCAINSCRNUR NONE DETECTED 05/28/2020 1700   LABBENZ POSITIVE (A) 05/28/2020 1700   AMPHETMU NONE DETECTED 05/28/2020 1700   THCU NONE DETECTED 05/28/2020 1700   LABBARB NONE DETECTED 05/28/2020 1700     RADIOLOGY STUDIES:  DG Abdomen Acute W/Chest  Result Date: 06/17/2020 CLINICAL DATA:  Chest pain EXAM: DG ABDOMEN ACUTE WITH 1 VIEW CHEST COMPARISON:  06/13/2020 FINDINGS: There is no evidence of dilated bowel loops or free intraperitoneal air. No radiopaque calculi or other significant radiographic abnormality is seen. Heart size and mediastinal contours are within normal limits. Both lungs are clear. IMPRESSION: Negative abdominal radiographs.  No acute cardiopulmonary disease. Electronically Signed   By: Miachel Roux M.D.   On: 06/17/2020 13:00      IMPRESSION:   *   Hematemesis.  Vomiting  triggered by pelvic pain. Rule out retching gastropathy, esophagitis, Mallory-Weiss tear.  *   Acute on chronic microcytic anemia in patient with menorrhagia. Reassuringly stable Hgb.    *    chronic pelvic pain.  Dr. Otho Perl is planning some sort of investigative surgery though no strong evidence for significant endometriosis on recent imaging.  *    ED/hospital/provider shopping.  Multiple visits to local ED's in recent months as well as last year.  *   Hx PE 01/2020. chronic Eliquis. No PE on CT angio as of February and March 2022.   PLAN:     *   EGD inpt vs discharge home on anti-emetics, PPI and fup w Dr Alan Ripper.   If inpt EGD, will need Covid 19 testing.    *   Please admit to observation through the hospitalist.  Hold the Eliquis and plan EGD depending on the timing of her last dose of Eliquis.   Azucena Freed  06/17/2020, 2:01 PM Phone [phone]  I agree that this patient's primary problem appears to be chronic pelvic pain which, when it worsens, causes this patient to vomit and then have hematemesis.  She does not have brisk GI bleeding, her hemoglobin is at baseline vital signs stable.  She looks well, and has some lower abdominal tenderness without guarding or rebound. I saw her in the ED in the presence of the family medicine resident and her nurse.  From what I understand, this patient's gynecologist would like an upper endoscopic evaluation for risk assessment prior to operation for reported endometriosis.  Patient's last dose of Eliquis was yesterday evening (or perhaps the day before, since she cannot be certain she even took it yesterday). Thus, she will be admitted for observation and have an upper endoscopy tomorrow.  Risk and benefits were reviewed.  The benefits and risks of the planned procedure were described in detail with the patient or (when appropriate) their health care proxy.  Risks were outlined as including, but not limited to, bleeding, infection,  perforation, adverse medication reaction leading to cardiac or pulmonary decompensation, pancreatitis (if ERCP).  The limitation of incomplete mucosal visualization was also discussed.  No guarantees or warranties were given.  Patient at increased risk for cardiopulmonary complications of procedure due to medical comorbidities.  If low risk findings, patient can most likely be discharged home later tomorrow.

## 2020-06-17 NOTE — ED Provider Notes (Signed)
Emergency Department Provider Note   I have reviewed the triage vital signs and the nursing notes.   HISTORY  Chief Complaint Hematemesis   HPI [name] is a 25 y.o. female with PMH reviewed below including PE on anticoagulation, Lupus, and endometriosis presents to the emergency department for evaluation of vomiting blood.  Patient has been seen in the emergency department many times this month with similar complaint.  She has followed with [name] in the past but has seen [name] during a hospitalization in March of this year.  Patient tells me that she is continuing to vomit bright red blood with some chest "sorenes."  She denies any abdominal pain other than what she attributes to her endometriosis.  She is not having shortness of breath or fever.  Patient tells me that after her most recent ED visit she tried to follow with [name] as an outpatient but was not able to secure a quick appointment. She was advised by her PCP to return to the ED. patient tells me that she has been passing out due to pain related to her endometriosis.  She notes that she had an MRI done at [facility] yesterday and that her OB/GYN would like to do surgery but is waiting on endoscopy because of her vomiting blood.  She notes that last night she had 3 episodes of vomiting bright red blood without coffee-ground material and presents today for evaluation.   Past Medical History:  Diagnosis Date  . Asthma   . Celiac disease   . Collagen vascular disease ([place])   . Diarrhea    Patient mentions diagnosis of ulcerative colitis but it is not clear she actually has UC  . Endometriosis   . Lupus ([place])   . Ovarian cyst   . Panic disorder 05/30/2020  . Pulmonary embolus ([place]) 02/06/2020    Patient Active Problem List   Diagnosis Date Noted  . MDD (major depressive disorder), recurrent severe, without psychosis ([place]) 05/30/2020  . Panic disorder 05/30/2020  . Major depressive disorder 05/29/2020   . Suicide attempt by acetaminophen overdose ([place]) 05/29/2020  . Obesity, Class III, BMI 40-49.9 (morbid obesity) ([place]) 04/05/2020  . SOB (shortness of breath) 04/05/2020  . Abdominal pain 04/04/2020  . SLE (systemic lupus erythematosus related syndrome) ([place]) 04/04/2020  . Shortness of breath 04/04/2020  . Pneumonia 03/23/2020  . Chronic nausea 03/23/2020  . Asthma 03/22/2020  . Dysuria 02/03/2020  . Hematuria 02/03/2020  . Mass of urinary bladder 01/26/2020  . Allergic rhinitis 05/05/2019  . 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 Endometriosis 03/14/2018  . Jakelin Taussig-term use of high-risk medication 07/02/2017  . Lupus anticoagulant positive 07/02/2017  . Personal history of pulmonary embolism 07/02/2017  . Other forms of systemic lupus erythematosus ([place]) 03/12/2017  . Anemia 02/28/2017  . Migraine 02/28/2017  . Dysmenorrhea 08/28/2011  . Gastritis 08/28/2011    Past Surgical History:  Procedure Laterality Date  . APPENDECTOMY    . CHOLECYSTECTOMY    . EXCISION OF ENDOMETRIOMA     Ovarian cyst removal  . excision of endometriosis    . FOOT FRACTURE SURGERY     w/ hardware  . HERNIA REPAIR    . TONSILLECTOMY      Allergies Azithromycin, Peanut-containing drug products, Iodinated diagnostic agents, Other, Amoxicillin, Fish allergy, Gluten meal, Hydromet [hydrocodone bit-homatrop mbr], Ibuprofen, Lactose intolerance (gi), Morphine and related, and Pantoprazole  Family History  Problem Relation Age of Onset  . Hypertension Mother   . Hypercholesterolemia  Mother   . Rheum arthritis Mother   . Diabetes Father   . Hypertension Father   . Cancer Father   . Autism Brother     Social History Social History   Tobacco Use  . Smoking status: Never Smoker  . Smokeless tobacco: Never Used  Vaping Use  . Vaping Use: Never used  Substance Use Topics  . Alcohol use: Never  . Drug use: Never    Review of Systems  Constitutional: No fever/chills Eyes: No visual  changes. ENT: No sore throat. Cardiovascular: Denies chest pain. Respiratory: Denies shortness of breath. Gastrointestinal: Positive chronic abdominal pain. Positive nausea and hematemesis. No diarrhea.  No constipation. Genitourinary: Negative for dysuria. Musculoskeletal: Negative for back pain. Skin: Negative for rash. Neurological: Negative for headaches, focal weakness or numbness.  10-point ROS otherwise negative.  ____________________________________________   PHYSICAL EXAM:  VITAL SIGNS: ED Triage Vitals  Enc Vitals Group     BP 06/17/20 1036 (!) 164/114     Pulse Rate 06/17/20 1036 (!) 119     Resp 06/17/20 1157 12     Temp 06/17/20 1036 99.2 F (37.3 C)     Temp src --      SpO2 06/17/20 1036 99 %     Weight 06/17/20 1152 240 lb (108.9 kg)     Height 06/17/20 1152 5' 2"  (1.575 m)   Constitutional: Alert and oriented. Well appearing and in no acute distress. Eyes: Conjunctivae are normal.  Head: Atraumatic. Nose: No congestion/rhinnorhea. Mouth/Throat: Mucous membranes are moist.  Neck: No stridor. Cardiovascular: Normal rate, regular rhythm. Good peripheral circulation. Grossly normal heart sounds.   Respiratory: Normal respiratory effort.  No retractions. Lungs CTAB. Gastrointestinal: Soft with mild diffuse tendneress. No distention.  Musculoskeletal: No lower extremity tenderness nor edema. No gross deformities of extremities. Neurologic:  Normal speech and language. No gross focal neurologic deficits are appreciated.  Skin:  Skin is warm, dry and intact. No rash noted.  ____________________________________________   LABS (all labs ordered are listed, but only abnormal results are displayed)  Labs Reviewed  CBC WITH DIFFERENTIAL/PLATELET - Abnormal; Notable for the following components:      Result Value   Hemoglobin 10.6 (*)    HCT 35.2 (*)    MCV 72.1 (*)    MCH 21.7 (*)    RDW 17.5 (*)    Platelets 411 (*)    All other components within normal  limits  COMPREHENSIVE METABOLIC PANEL - Abnormal; Notable for the following components:   AST 14 (*)    All other components within normal limits  RESP PANEL BY RT-PCR (FLU A&B, COVID) ARPGX2  PROTIME-INR  LIPASE, BLOOD  CBC WITH DIFFERENTIAL/PLATELET  I-STAT BETA HCG BLOOD, ED ([facility], [facility], [facility] ONLY)  TYPE AND SCREEN   ____________________________________________  EKG  None ____________________________________________  RADIOLOGY  DG Abdomen Acute W/Chest  Result Date: 06/17/2020 CLINICAL DATA:  Chest pain EXAM: DG ABDOMEN ACUTE WITH 1 VIEW CHEST COMPARISON:  06/13/2020 FINDINGS: There is no evidence of dilated bowel loops or free intraperitoneal air. No radiopaque calculi or other significant radiographic abnormality is seen. Heart size and mediastinal contours are within normal limits. Both lungs are clear. IMPRESSION: Negative abdominal radiographs.  No acute cardiopulmonary disease. Electronically Signed   By: Miachel Roux M.D.   On: 06/17/2020 13:00    ____________________________________________   PROCEDURES  Procedure(s) performed:   Procedures  None ____________________________________________   INITIAL IMPRESSION / ASSESSMENT AND PLAN / ED COURSE  Pertinent labs &  imaging results that were available during my care of the patient were reviewed by me and considered in my medical decision making (see chart for details).   Patient returns to the emergency department with complaint of bright red hematemesis x 3 last night.  Abdomen is nonperitoneal.  Mild tachycardia on arrival but that is improved on my evaluation.  She is not hypoxemic.  She overall looks well and is able to provide a full history.  Precise chart review is difficult as the patient is seen at multiple health systems.  She had a normal MRI at [facility] yesterday.  I can see telephone encounter from yesterday where she called [facility] for an appointment and record transfer.  Plan for acute abdomen plain  films but my suspicion for Boerhaave's is exceedingly low.  Question Mallory-Weiss tear/esophagitis. Will obtain blood work and discuss with GI on call.   03:18 PM  Patient's lab work and imaging are reassuring here.  Hemoglobin is at 10.6.  Discussed the case with one of our GI who consulted on the patient and plan for upper endoscopy with timing based on patient's last anticoagulation dose.   Discussed patient's case with IM to request admission. Patient and family (if present) updated with plan. Care transferred to IM service.  I reviewed all nursing notes, vitals, pertinent old records, EKGs, labs, imaging (as available).  ____________________________________________  FINAL CLINICAL IMPRESSION(S) / ED DIAGNOSES  Final diagnoses:  Hematemesis with nausea    MEDICATIONS GIVEN DURING THIS VISIT:  Medications  HYDROmorphone (DILAUDID) injection 0.5 mg (has no administration in time range)  ondansetron (ZOFRAN) injection 4 mg (4 mg Intravenous Given 06/17/20 1225)  HYDROmorphone (DILAUDID) injection 0.5 mg (0.5 mg Intravenous Given 06/17/20 1247)  diphenhydrAMINE (BENADRYL) injection 25 mg (25 mg Intravenous Given 06/17/20 1247)    Note:  This document was prepared using Dragon voice recognition software and may include unintentional dictation errors.  Nanda Quinton, MD, [facility]    Beryl Hornberger, Wonda [facility], MD 06/17/20 [phone]

## 2020-06-17 NOTE — ED Notes (Signed)
Pt transferred to xray

## 2020-06-17 NOTE — ED Triage Notes (Signed)
Pt was seen on 5/23 for same. Pt reports she is having hematemesis and it is getting worse.Pt denies any cp,sob

## 2020-06-17 NOTE — ED Notes (Signed)
Pt stated she will take a COVID swab if necessary for her care, but she will decline until the provider says it is necessary.

## 2020-06-17 NOTE — ED Notes (Signed)
IV consult placed.

## 2020-06-18 ENCOUNTER — Observation Stay (HOSPITAL_COMMUNITY): Payer: PPO | Admitting: Anesthesiology

## 2020-06-18 ENCOUNTER — Encounter (HOSPITAL_COMMUNITY)
Admission: EM | Disposition: A | Discharge status: Home / Self Care | Payer: Self-pay | Source: Home / Self Care | Attending: Emergency Medicine

## 2020-06-18 ENCOUNTER — Encounter (HOSPITAL_COMMUNITY): Payer: Self-pay | Admitting: Family Medicine

## 2020-06-18 DIAGNOSIS — N800 Endometriosis of uterus: Secondary | ICD-10-CM | POA: Diagnosis not present

## 2020-06-18 DIAGNOSIS — Z20822 Contact with and (suspected) exposure to covid-19: Secondary | ICD-10-CM | POA: Diagnosis not present

## 2020-06-18 DIAGNOSIS — R110 Nausea: Secondary | ICD-10-CM | POA: Diagnosis not present

## 2020-06-18 DIAGNOSIS — K920 Hematemesis: Secondary | ICD-10-CM | POA: Diagnosis not present

## 2020-06-18 DIAGNOSIS — F419 Anxiety disorder, unspecified: Secondary | ICD-10-CM | POA: Diagnosis not present

## 2020-06-18 DIAGNOSIS — N809 Endometriosis, unspecified: Secondary | ICD-10-CM | POA: Diagnosis not present

## 2020-06-18 DIAGNOSIS — J45909 Unspecified asthma, uncomplicated: Secondary | ICD-10-CM | POA: Diagnosis not present

## 2020-06-18 HISTORY — PX: ESOPHAGOGASTRODUODENOSCOPY (EGD) WITH PROPOFOL: SHX5813

## 2020-06-18 LAB — GLUCOSE, CAPILLARY
Glucose-Capillary: 118 mg/dL — ABNORMAL HIGH (ref 70–99)
Glucose-Capillary: 100 mg/dL — ABNORMAL HIGH (ref 70–99)
Glucose-Capillary: 111 mg/dL — ABNORMAL HIGH (ref 70–99)

## 2020-06-18 LAB — CBC
HCT: 33 % — ABNORMAL LOW (ref 36.0–46.0)
Hemoglobin: 10.2 g/dL — ABNORMAL LOW (ref 12.0–15.0)
MCH: 21.7 pg — ABNORMAL LOW (ref 26.0–34.0)
MCHC: 30.9 g/dL (ref 30.0–36.0)
MCV: 70.4 fL — ABNORMAL LOW (ref 80.0–100.0)
Platelets: 397 10*3/uL (ref 150–400)
RBC: 4.69 MIL/uL (ref 3.87–5.11)
RDW: 17.8 % — ABNORMAL HIGH (ref 11.5–15.5)
WBC: 10.3 10*3/uL (ref 4.0–10.5)
nRBC: 0 % (ref 0.0–0.2)

## 2020-06-18 LAB — BASIC METABOLIC PANEL
Anion gap: 9 (ref 5–15)
BUN: 9 mg/dL (ref 6–20)
CO2: 21 mmol/L — ABNORMAL LOW (ref 22–32)
Calcium: 8.9 mg/dL (ref 8.9–10.3)
Chloride: 106 mmol/L (ref 98–111)
Creatinine, Ser: 0.73 mg/dL (ref 0.44–1.00)
GFR, Estimated: >60 mL/min (ref >60)
Glucose, Bld: 100 mg/dL — ABNORMAL HIGH (ref 70–99)
Potassium: 3.8 mmol/L (ref 3.5–5.1)
Sodium: 136 mmol/L (ref 135–145)

## 2020-06-18 SURGERY — ESOPHAGOGASTRODUODENOSCOPY (EGD) WITH PROPOFOL
Anesthesia: Monitor Anesthesia Care

## 2020-06-18 MED ORDER — LACTATED RINGERS IV SOLN: INTRAVENOUS | Status: DC | PRN

## 2020-06-18 MED ORDER — NORETHINDRONE ACETATE 5 MG PO TABS: 5.0000 mg | ORAL_TABLET | Freq: Every day | ORAL | 0 refills | Status: DC

## 2020-06-18 MED ORDER — ONDANSETRON HCL 4 MG/2ML IJ SOLN
4.0000 mg | Freq: Once | INTRAMUSCULAR | Status: AC
  Administered 2020-06-18: 4 mg via INTRAVENOUS
  Filled 2020-06-18: qty 2

## 2020-06-18 MED ORDER — PROPOFOL 500 MG/50ML IV EMUL
INTRAVENOUS | Status: DC | PRN
  Administered 2020-06-18: 125 ug/kg/min via INTRAVENOUS

## 2020-06-18 MED ORDER — APIXABAN 5 MG PO TABS: 5.0000 mg | ORAL_TABLET | Freq: Two times a day (BID) | ORAL | Status: DC

## 2020-06-18 MED ORDER — PROPOFOL 10 MG/ML IV BOLUS
INTRAVENOUS | Status: DC | PRN
  Administered 2020-06-18 (×2): 50 mg via INTRAVENOUS

## 2020-06-18 MED ORDER — LACTATED RINGERS IV SOLN: INTRAVENOUS | Status: DC

## 2020-06-18 SURGICAL SUPPLY — 15 items

## 2020-06-18 NOTE — Transfer of Care (Signed)
Immediate Anesthesia Transfer of Care Note  Patient: [name]  Procedure(s) Performed: ESOPHAGOGASTRODUODENOSCOPY (EGD) WITH PROPOFOL (N/A )  Patient Location: PACU  Anesthesia Type:MAC  Level of Consciousness: awake and alert   Airway & Oxygen Therapy: Patient Spontanous Breathing  Post-op Assessment: Report given to RN and Post -op Vital signs reviewed and stable  Post vital signs: Reviewed and stable  Last Vitals:  Vitals Value Taken Time  BP 125/68 06/18/20 1348  Temp    Pulse 90 06/18/20 1350  Resp 25 06/18/20 1350  SpO2 93 % 06/18/20 1350  Vitals shown include unvalidated device data.  Last Pain:  Vitals:   06/18/20 1233  TempSrc: Oral  PainSc:          Complications: No complications documented.

## 2020-06-18 NOTE — Anesthesia Procedure Notes (Signed)
Procedure Name: MAC Date/Time: 06/18/2020 1:23 PM Performed by: Eligha Bridegroom, CRNA Pre-anesthesia Checklist: Patient identified, Emergency Drugs available, Suction available, Patient being monitored and Timeout performed Patient Re-evaluated:Patient Re-evaluated prior to induction Preoxygenation: Pre-oxygenation with 100% oxygen Induction Type: IV induction

## 2020-06-18 NOTE — Progress Notes (Signed)
Brief Attending Note  Patient seen early AM prior to EGD. She reports mild ongoing cramping. No more emesis. Denies chest pain, dyspnea. Vitals:   06/18/20 1422 06/18/20 1622  BP: (!) 109/93 115/63  Pulse: 88 86  Resp: 19 20  Temp: 98.5 F (36.9 C) 98.2 F (36.8 C)  SpO2: 97% 96%    Cardiac: Regular rate and rhythm. Normal S1/S2. No murmurs, rubs, or gallops appreciated. Lungs: Clear bilaterally to ascultation.  Abdomen: Normoactive bowel sounds. Minimal tenderness throughout abdomen, no rebound or guarding.   Labs reviewed.   Pending EGD this afternoon and tolerating clears after this, will plan for discharge on home medications. Restart Eliquis as soon as able.   Dorris Singh, MD  Family Medicine Teaching Service

## 2020-06-18 NOTE — Anesthesia Postprocedure Evaluation (Signed)
Anesthesia Post Note  Patient: [name]  Procedure(s) Performed: ESOPHAGOGASTRODUODENOSCOPY (EGD) WITH PROPOFOL (N/A )     Patient location during evaluation: PACU Anesthesia Type: MAC Level of consciousness: awake and alert Pain management: pain level controlled Vital Signs Assessment: post-procedure vital signs reviewed and stable Respiratory status: spontaneous breathing, nonlabored ventilation and respiratory function stable Cardiovascular status: stable and blood pressure returned to baseline Postop Assessment: no apparent nausea or vomiting Anesthetic complications: no   No complications documented.  Last Vitals:  Vitals:   06/18/20 1403 06/18/20 1422  BP: 134/84 (!) 109/93  Pulse: 87 88  Resp: 20 19  Temp: 36.4 C 36.9 C  SpO2: 95% 97%    Last Pain:  Vitals:   06/18/20 1422  TempSrc: Oral  PainSc:                  Catalina Gravel

## 2020-06-18 NOTE — Interval H&P Note (Signed)
History and Physical Interval Note:  06/18/2020 1:22 PM  [name]  has presented today for surgery, with the diagnosis of Hematemesis..  The various methods of treatment have been discussed with the patient and family. After consideration of risks, benefits and other options for treatment, the patient has consented to  Procedure(s): ESOPHAGOGASTRODUODENOSCOPY (EGD) WITH PROPOFOL (N/A) as a surgical intervention.  The patient's history has been reviewed, patient examined, no change in status, stable for surgery.  I have reviewed the patient's chart and labs.  Questions were answered to the patient's satisfaction.    Hgb stable today  Nelida Meuse III

## 2020-06-18 NOTE — Discharge Instructions (Signed)
Thank you for allowing [place] to take part in your care during your hospital stay.  You were admitted for hematemesis.  It is safe for you to resume your Eliquis twice a day.  Please make an appointment to follow up with your PCP and OB/GYN within the next week.  If you have any further bleeding, shortness of breath, weakness or chest pain please call you PCP or go to Urgent Care or Emergency Department

## 2020-06-18 NOTE — Hospital Course (Addendum)
[name] is a 25 y.o. female presenting with Hematemesis . PMH is significant for SLE, Lupus Anticoagulant, Endometriosis, GAD, Panic Disorder.  Pain below is her brief hospital course listed by problem.   Hematemesis  Hx Endometriosis Patient presented after complaints of bright red hematemesis x3.  Vitals stable, exam though with tenderness to her lower abdomen, without peritoneal signs.  She had no episodes of emesis or hematemesis during hospitalization.  Hemoglobin remained stable 10.6>10.2 (at baseline).  GI was consulted, performed EGD on 5/28 without any upper GI bleeding.  Patient should follow-up with OB/GYN for further management of her endometriosis.  SLE, Lupus Anticoagulant  Hx PE 01/2020 Anticoagulation was briefly held for EGD.  Patient was discharged back on her Eliquis.   Other chronic conditions stable.  Follow-up recommendations Recommend follow-up with OB/GYN for her endometriosis Would recommend decreasing Xanax as able

## 2020-06-18 NOTE — Discharge Summary (Signed)
[facility] Discharge Summary  Patient name: [name] Medical record number: [id number] Date of birth: 1995/08/09 Age: 25 y.o. Gender: female Date of Admission: 06/17/2020  Date of Discharge: 06/18/20 Admitting Physician: Delora Fuel, MD  Primary Care Provider: Pcp, No Consultants: Gastroenterology   Indication for Hospitalization: Hematemesis  Discharge Diagnoses/Problem List:  Active Problems:   Endometriosis   Hematemesis Hx PE in 01/2020, on anticoagulation Anxiety/Panic Disorder Insomnia   Disposition: Home  Discharge Condition: Stable  Discharge Exam:  Blood pressure 115/63, pulse 86, temperature 98.2 F (36.8 C), resp. rate 20, height 5' 2"  (1.575 m), weight 108.9 kg, last menstrual period 06/06/2020, SpO2 96 %. General: Tired but pleasant and cooperative Cardiovascular: RRR, without murmur Respiratory: In no respiratory distress, CTA B, without wheezing/rhonchi/rales Abdomen: Obese, tenderness to palpation of LLQ, suprapubic, RLQ without rebound/guarding, abdomen is soft, non-distended Extremities: without edema, warm and dry  Brief Hospital Course:  [name] is a 25 y.o. female presenting with Hematemesis . PMH is significant for SLE, Lupus Anticoagulant, Endometriosis, GAD, Panic Disorder.  Pain below is her brief hospital course listed by problem.   Hematemesis  Hx Endometriosis Patient presented after complaints of bright red hematemesis x3.  Vitals stable, exam though with tenderness to her lower abdomen, without peritoneal signs.  She had no episodes of emesis or hematemesis during hospitalization.  Hemoglobin remained stable 10.6>10.2 (at baseline).  GI was consulted, performed EGD on 5/28 without any upper GI bleeding.  Patient should follow-up with OB/GYN for further management of her endometriosis.  SLE, Lupus Anticoagulant  Hx PE 01/2020 Anticoagulation was briefly held for EGD.  Patient was discharged back on  her Eliquis.   Other chronic conditions stable.  Follow-up recommendations 1. Recommend follow-up with OB/GYN for her endometriosis 2. Would recommend decreasing Xanax as able   Significant Procedures:  EGD 5/27: With normal esophagus, stomach and examined duodenum.  No biopsies were collected.  Significant Labs and Imaging:  Recent Labs  Lab 06/13/20 1936 06/17/20 1245 06/18/20 0614  WBC 10.5 6.6 10.3  HGB 9.7* 10.6* 10.2*  HCT 31.0* 35.2* 33.0*  PLT 300 411* 397   Recent Labs  Lab 06/12/20 0805 06/13/20 1412 06/17/20 1245 06/18/20 0614  NA 139 142 138 136  K 3.5 3.4* 3.9 3.8  CL 108 109 106 106  CO2 24 26 24  21*  GLUCOSE 96 91 95 100*  BUN 8 7 10 9   CREATININE 0.82 0.84 0.83 0.73  CALCIUM 8.6* 8.9 9.3 8.9  ALKPHOS 63 63 70  --   AST 28 21 14*  --   ALT 21 19 15   --   ALBUMIN 4.0 3.9 3.7  --     DG ABDOMEN ACUTE WITH 1 VIEW CHEST COMPARISON:  06/13/2020 FINDINGS: There is no evidence of dilated bowel loops or free intraperitoneal air. No radiopaque calculi or other significant radiographic abnormality is seen. Heart size and mediastinal contours are within normal limits. Both lungs are clear. IMPRESSION: Negative abdominal radiographs.  No acute cardiopulmonary disease.   Results/Tests Pending at Time of Discharge: None  Discharge Medications:  Allergies as of 06/18/2020      Reactions   Azithromycin Anaphylaxis   Peanut-containing Drug Products Other (See Comments)   Unknown ; treenuts, shell fish Unknown reaction, took allergy test   Iodinated Diagnostic Agents Hives, Itching   02/04/2020 Patient stated has history of itching and hives with CT IV Iodine contrast, premedicated with Benadryl 25 mg IV prior to CT  IV Iodine scan today.Post CT scan patient reported itching, Benadryl 25 mg IV given, symptoms resolved, NP recommends for patient to get premedicated with Benadryl 50 mg IV prior to future CT IV Iodine scans.    Other Other (See Comments)    Moderna covid vaccine -Syncope    Amoxicillin Swelling   Hand swelling    Fish Allergy Other (See Comments)   Unknown Allergy test    Gluten Meal Other (See Comments)   Celiac disease   Hydromet [hydrocodone Bit-homatrop Mbr] Nausea And Vomiting   Ibuprofen Nausea And Vomiting   Lactose Intolerance (gi)    Morphine And Related    vomiting   Pantoprazole Other (See Comments)   Stomach pain      Medication List    STOP taking these medications   ALPRAZolam 1 MG tablet Commonly known as: XANAX   Lo Loestrin Fe 1 MG-10 MCG / 10 MCG tablet Generic drug: Norethindrone-Ethinyl Estradiol-Fe Biphas   oxyCODONE 5 MG immediate release tablet Commonly known as: Roxicodone   promethazine 25 MG tablet Commonly known as: PHENERGAN     TAKE these medications   albuterol 108 (90 Base) MCG/ACT inhaler Commonly known as: VENTOLIN HFA Inhale 2 puffs into the lungs every 6 (six) hours as needed for wheezing or shortness of breath.   apixaban 5 MG Tabs tablet Commonly known as: ELIQUIS Take 5 mg by mouth 2 (two) times daily.   EPINEPHrine 0.3 mg/0.3 mL Soaj injection Commonly known as: EPI-PEN Inject 0.3 mg into the muscle daily as needed (Allergic Reaction).   hydroxychloroquine 200 MG tablet Commonly known as: PLAQUENIL Take 2 tablets (400 mg total) by mouth daily. What changed: when to take this   norethindrone 5 MG tablet Commonly known as: AYGESTIN Take 1 tablet (5 mg total) by mouth at bedtime.   oxyCODONE-acetaminophen 5-325 MG tablet Commonly known as: PERCOCET/ROXICET Take 1 tablet by mouth every 4 (four) hours as needed for severe pain.   sertraline 100 MG tablet Commonly known as: ZOLOFT Take 2 tablets (200 mg total) by mouth at bedtime. For depression   zolpidem 10 MG tablet Commonly known as: AMBIEN Take 20 mg by mouth at bedtime. What changed: Another medication with the same name was removed. Continue taking this medication, and follow the directions you see  here.       Discharge Instructions: Please refer to Patient Instructions section of EMR for full details.  Patient was counseled important signs and symptoms that should prompt return to medical care, changes in medications, dietary instructions, activity restrictions, and follow up appointments.   Follow-Up Appointments:  Follow-up Information    Shahid, Twana First, MD Follow up.   Specialty: Gastroenterology Why: call for GI follow up.   Contact information: [place] [address] [place], [place], DO 06/18/2020, 6:01 PM PGY-1, [place]

## 2020-06-18 NOTE — Anesthesia Preprocedure Evaluation (Addendum)
Anesthesia Evaluation  Patient identified by MRN, date of birth, ID band Patient awake    Reviewed: Allergy & Precautions, NPO status , Patient's Chart, lab work & pertinent test results  Airway Mallampati: II  TM Distance: >3 FB Neck ROM: Full    Dental  (+) Teeth Intact, Dental Advisory Given   Pulmonary asthma , PE   Pulmonary exam normal breath sounds clear to auscultation       Cardiovascular negative cardio ROS   Rhythm:Regular Rate:Tachycardia     Neuro/Psych  Headaches, PSYCHIATRIC DISORDERS Anxiety Depression    GI/Hepatic Neg liver ROS, Celiac disease Hematemesis   Endo/Other  Morbid obesityLupus  Renal/GU negative Renal ROS     Musculoskeletal negative musculoskeletal ROS (+)   Abdominal   Peds  Hematology  (+) Blood dyscrasia (Eliquis), anemia ,   Anesthesia Other Findings Day of surgery medications reviewed with the patient.  Reproductive/Obstetrics                            Anesthesia Physical Anesthesia Plan  ASA: III  Anesthesia Plan: MAC   Post-op Pain Management:    Induction: Intravenous  PONV Risk Score and Plan: 2 and Propofol infusion and Treatment may vary due to age or medical condition  Airway Management Planned: Nasal Cannula and Natural Airway  Additional Equipment:   Intra-op Plan:   Post-operative Plan:   Informed Consent: I have reviewed the patients History and Physical, chart, labs and discussed the procedure including the risks, benefits and alternatives for the proposed anesthesia with the patient or authorized representative who has indicated his/her understanding and acceptance.     Dental advisory given  Plan Discussed with: CRNA and Anesthesiologist  Anesthesia Plan Comments:         Anesthesia Quick Evaluation

## 2020-06-18 NOTE — Op Note (Signed)
[facility] Patient Name: [name] Procedure Date : 06/18/2020 MRN: [id number] Attending MD: Estill Cotta. Loletha Carrow , MD Date of Birth: [birth year] CSN: [id number] Age: 25 Admit Type: Inpatient Procedure:                Upper GI endoscopy Indications:              Hematemesis - recurrent                           chronic iron deficiency anemia (menorrhagia and                            endometriosis) Providers:                Estill Cotta. Loletha Carrow, MD, Jeanella Cara, RN,                            Tyrone Apple, Technician Referring MD:             Medical Teaching Service Medicines:                Monitored Anesthesia Care Complications:            No immediate complications. Estimated Blood Loss:     Estimated blood loss: none. Procedure:                Pre-Anesthesia Assessment:                           - Prior to the procedure, a History and Physical                            was performed, and patient medications and                            allergies were reviewed. The patient's tolerance of                            previous anesthesia was also reviewed. The risks                            and benefits of the procedure and the sedation                            options and risks were discussed with the patient.                            All questions were answered, and informed consent                            was obtained. Prior Anticoagulants: The patient has                            taken Eliquis (apixaban), last dose was 2 days                            prior to procedure.  ASA Grade Assessment: III - A                            patient with severe systemic disease. After                            reviewing the risks and benefits, the patient was                            deemed in satisfactory condition to undergo the                            procedure.                           After obtaining informed consent, the endoscope was                             passed under direct vision. Throughout the                            procedure, the patient's blood pressure, pulse, and                            oxygen saturations were monitored continuously. The                            GIF-H190 ([id number]) Olympus gastroscope was                            introduced through the mouth, and advanced to the                            second part of duodenum. The upper GI endoscopy was                            accomplished without difficulty. The patient                            tolerated the procedure (difficult to sedate with                            propofol, retching). Scope In: Scope Out: Findings:      The esophagus was normal.      The stomach was normal.      The cardia and gastric fundus were normal on retroflexion.      The examined duodenum was normal. Impression:               - Normal esophagus.                           - Normal stomach.                           - Normal examined duodenum.                           -  No specimens collected.                           No source of reported hematemesis. No hematemesis                            in ED or since admission. Recommendation:           - Return patient to hospital ward for possible                            discharge same day.                           - Resume regular diet.                           - Resume Eliquis today                           Patient can follow up as needed with her primary GI                            provider at [facility] (has been seen                            by Dr. Tarri Glenn of our practice for prior inpatient                            consult) Procedure Code(s):        --- Professional ---                           [phone], Esophagogastroduodenoscopy, flexible,                            transoral; diagnostic, including collection of                            specimen(s) by brushing or washing, when performed                             (separate procedure) Diagnosis Code(s):        --- Professional ---                           K92.0, Hematemesis CPT copyright 2019 [employer]. All rights reserved. The codes documented in this report are preliminary and upon coder review may  be revised to meet current compliance requirements. Ferd Horrigan L. Loletha Carrow, MD 06/18/2020 1:45:27 PM This report has been signed electronically. Number of Addenda: 0

## 2020-06-20 ENCOUNTER — Encounter (HOSPITAL_COMMUNITY): Payer: Self-pay | Admitting: Gastroenterology

## 2020-06-24 ENCOUNTER — Other Ambulatory Visit: Payer: PPO

## 2020-06-27 ENCOUNTER — Other Ambulatory Visit: Payer: Self-pay

## 2020-06-27 DIAGNOSIS — Z7901 Long term (current) use of anticoagulants: Secondary | ICD-10-CM | POA: Insufficient documentation

## 2020-06-27 DIAGNOSIS — R102 Pelvic and perineal pain: Secondary | ICD-10-CM | POA: Diagnosis not present

## 2020-06-27 DIAGNOSIS — Z91010 Allergy to peanuts: Secondary | ICD-10-CM | POA: Insufficient documentation

## 2020-06-27 DIAGNOSIS — R112 Nausea with vomiting, unspecified: Secondary | ICD-10-CM | POA: Diagnosis not present

## 2020-06-27 DIAGNOSIS — J45909 Unspecified asthma, uncomplicated: Secondary | ICD-10-CM | POA: Diagnosis not present

## 2020-06-28 ENCOUNTER — Emergency Department (HOSPITAL_BASED_OUTPATIENT_CLINIC_OR_DEPARTMENT_OTHER)
Admission: EM | Admit: 2020-06-28 | Discharge: 2020-06-28 | Disposition: A | Discharge status: Home / Self Care | Payer: PPO | Attending: Emergency Medicine | Admitting: Emergency Medicine

## 2020-06-28 ENCOUNTER — Encounter (HOSPITAL_BASED_OUTPATIENT_CLINIC_OR_DEPARTMENT_OTHER): Payer: Self-pay | Admitting: Emergency Medicine

## 2020-06-28 ENCOUNTER — Emergency Department (HOSPITAL_BASED_OUTPATIENT_CLINIC_OR_DEPARTMENT_OTHER): Payer: PPO

## 2020-06-28 ENCOUNTER — Other Ambulatory Visit: Payer: Self-pay

## 2020-06-28 DIAGNOSIS — G8929 Other chronic pain: Secondary | ICD-10-CM

## 2020-06-28 DIAGNOSIS — R52 Pain, unspecified: Secondary | ICD-10-CM

## 2020-06-28 LAB — CBC WITH DIFFERENTIAL/PLATELET
Abs Immature Granulocytes: 0.01 10*3/uL (ref 0.00–0.07)
Basophils Absolute: 0.1 10*3/uL (ref 0.0–0.1)
Basophils Relative: 1 %
Eosinophils Absolute: 0.2 10*3/uL (ref 0.0–0.5)
Eosinophils Relative: 2 %
HCT: 33.4 % — ABNORMAL LOW (ref 36.0–46.0)
Hemoglobin: 10.6 g/dL — ABNORMAL LOW (ref 12.0–15.0)
Immature Granulocytes: 0 %
Lymphocytes Relative: 36 %
Lymphs Abs: 3.5 10*3/uL (ref 0.7–4.0)
MCH: 21.6 pg — ABNORMAL LOW (ref 26.0–34.0)
MCHC: 31.7 g/dL (ref 30.0–36.0)
MCV: 68 fL — ABNORMAL LOW (ref 80.0–100.0)
Monocytes Absolute: 0.5 10*3/uL (ref 0.1–1.0)
Monocytes Relative: 5 %
Neutro Abs: 5.6 10*3/uL (ref 1.7–7.7)
Neutrophils Relative %: 56 %
Platelets: 596 10*3/uL — ABNORMAL HIGH (ref 150–400)
RBC: 4.91 MIL/uL (ref 3.87–5.11)
RDW: 18.5 % — ABNORMAL HIGH (ref 11.5–15.5)
WBC: 9.8 10*3/uL (ref 4.0–10.5)
nRBC: 0 % (ref 0.0–0.2)

## 2020-06-28 LAB — URINALYSIS, ROUTINE W REFLEX MICROSCOPIC
Bilirubin Urine: NEGATIVE
Glucose, UA: NEGATIVE mg/dL
Hgb urine dipstick: NEGATIVE
Ketones, ur: NEGATIVE mg/dL
Leukocytes,Ua: NEGATIVE
Nitrite: NEGATIVE
Specific Gravity, Urine: 1.029 (ref 1.005–1.030)
pH: 5.5 (ref 5.0–8.0)

## 2020-06-28 LAB — BASIC METABOLIC PANEL
Anion gap: 10 (ref 5–15)
BUN: 8 mg/dL (ref 6–20)
CO2: 21 mmol/L — ABNORMAL LOW (ref 22–32)
Calcium: 9.4 mg/dL (ref 8.9–10.3)
Chloride: 109 mmol/L (ref 98–111)
Creatinine, Ser: 0.77 mg/dL (ref 0.44–1.00)
GFR, Estimated: >60 mL/min (ref >60)
Glucose, Bld: 96 mg/dL (ref 70–99)
Potassium: 3.6 mmol/L (ref 3.5–5.1)
Sodium: 140 mmol/L (ref 135–145)

## 2020-06-28 LAB — PREGNANCY, URINE: Preg Test, Ur: NEGATIVE

## 2020-06-28 MED ORDER — METHOCARBAMOL 1000 MG/10ML IJ SOLN
1000.0000 mg | Freq: Once | INTRAMUSCULAR | Status: AC
  Administered 2020-06-28: 1000 mg via INTRAVENOUS
  Filled 2020-06-28: qty 10

## 2020-06-28 MED ORDER — ONDANSETRON 8 MG PO TBDP: ORAL_TABLET | ORAL | 0 refills | Status: DC

## 2020-06-28 MED ORDER — FENTANYL CITRATE (PF) 100 MCG/2ML IJ SOLN
50.0000 ug | Freq: Once | INTRAMUSCULAR | Status: AC
  Administered 2020-06-28: 50 ug via INTRAVENOUS
  Filled 2020-06-28: qty 2

## 2020-06-28 MED ORDER — METAXALONE 800 MG PO TABS: 800.0000 mg | ORAL_TABLET | Freq: Three times a day (TID) | ORAL | 0 refills | Status: DC

## 2020-06-28 MED ORDER — SODIUM CHLORIDE 0.9 % IV BOLUS
500.0000 mL | Freq: Once | INTRAVENOUS | Status: AC
  Administered 2020-06-28: 500 mL via INTRAVENOUS

## 2020-06-28 MED ORDER — HALOPERIDOL LACTATE 5 MG/ML IJ SOLN
2.0000 mg | Freq: Once | INTRAMUSCULAR | Status: AC
  Administered 2020-06-28: 2 mg via INTRAVENOUS
  Filled 2020-06-28: qty 1

## 2020-06-28 NOTE — ED Triage Notes (Signed)
Dr. Randal Buba to bedside while pt is being triaged    Right lower abdominal pain, radiates around to the back area. Pain started Friday. Hx of endometriosis. Vomiting today "because of pain"
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
## 2020-06-29 ENCOUNTER — Encounter: Payer: Self-pay | Admitting: Physician Assistant

## 2020-06-29 ENCOUNTER — Emergency Department (HOSPITAL_BASED_OUTPATIENT_CLINIC_OR_DEPARTMENT_OTHER): Payer: PPO

## 2020-06-29 ENCOUNTER — Other Ambulatory Visit: Payer: Self-pay

## 2020-06-29 ENCOUNTER — Telehealth: Payer: PPO | Admitting: Physician Assistant

## 2020-06-29 ENCOUNTER — Emergency Department (HOSPITAL_BASED_OUTPATIENT_CLINIC_OR_DEPARTMENT_OTHER)
Admission: EM | Admit: 2020-06-29 | Discharge: 2020-06-29 | Disposition: A | Discharge status: Home / Self Care | Payer: PPO | Source: Home / Self Care | Attending: Emergency Medicine | Admitting: Emergency Medicine

## 2020-06-29 ENCOUNTER — Emergency Department (HOSPITAL_COMMUNITY)
Admission: EM | Admit: 2020-06-29 | Discharge: 2020-06-29 | Disposition: A | Discharge status: Home / Self Care | Payer: PPO | Attending: Emergency Medicine | Admitting: Emergency Medicine

## 2020-06-29 ENCOUNTER — Encounter (HOSPITAL_COMMUNITY): Payer: Self-pay

## 2020-06-29 ENCOUNTER — Encounter (HOSPITAL_BASED_OUTPATIENT_CLINIC_OR_DEPARTMENT_OTHER): Payer: Self-pay | Admitting: *Deleted

## 2020-06-29 DIAGNOSIS — R748 Abnormal levels of other serum enzymes: Secondary | ICD-10-CM

## 2020-06-29 DIAGNOSIS — I10 Essential (primary) hypertension: Secondary | ICD-10-CM

## 2020-06-29 DIAGNOSIS — J45909 Unspecified asthma, uncomplicated: Secondary | ICD-10-CM | POA: Insufficient documentation

## 2020-06-29 DIAGNOSIS — R112 Nausea with vomiting, unspecified: Secondary | ICD-10-CM | POA: Insufficient documentation

## 2020-06-29 DIAGNOSIS — Z7901 Long term (current) use of anticoagulants: Secondary | ICD-10-CM | POA: Insufficient documentation

## 2020-06-29 DIAGNOSIS — Z7951 Long term (current) use of inhaled steroids: Secondary | ICD-10-CM | POA: Insufficient documentation

## 2020-06-29 DIAGNOSIS — Z91010 Allergy to peanuts: Secondary | ICD-10-CM | POA: Insufficient documentation

## 2020-06-29 DIAGNOSIS — R1084 Generalized abdominal pain: Secondary | ICD-10-CM | POA: Insufficient documentation

## 2020-06-29 DIAGNOSIS — R109 Unspecified abdominal pain: Secondary | ICD-10-CM

## 2020-06-29 DIAGNOSIS — R55 Syncope and collapse: Secondary | ICD-10-CM

## 2020-06-29 LAB — COMPREHENSIVE METABOLIC PANEL
ALT: 11 U/L (ref 0–44)
AST: 14 U/L — ABNORMAL LOW (ref 15–41)
Albumin: 4.2 g/dL (ref 3.5–5.0)
Alkaline Phosphatase: 61 U/L (ref 38–126)
Anion gap: 11 (ref 5–15)
BUN: 9 mg/dL (ref 6–20)
CO2: 15 mmol/L — ABNORMAL LOW (ref 22–32)
Calcium: 9.3 mg/dL (ref 8.9–10.3)
Chloride: 111 mmol/L (ref 98–111)
Creatinine, Ser: 0.79 mg/dL (ref 0.44–1.00)
GFR, Estimated: >60 mL/min (ref >60)
Glucose, Bld: 122 mg/dL — ABNORMAL HIGH (ref 70–99)
Potassium: 3.5 mmol/L (ref 3.5–5.1)
Sodium: 137 mmol/L (ref 135–145)
Total Bilirubin: <0.1 mg/dL — ABNORMAL LOW (ref 0.3–1.2)
Total Protein: 7.7 g/dL (ref 6.5–8.1)

## 2020-06-29 LAB — CBC
HCT: 33.7 % — ABNORMAL LOW (ref 36.0–46.0)
Hemoglobin: 10.5 g/dL — ABNORMAL LOW (ref 12.0–15.0)
MCH: 21.9 pg — ABNORMAL LOW (ref 26.0–34.0)
MCHC: 31.2 g/dL (ref 30.0–36.0)
MCV: 70.4 fL — ABNORMAL LOW (ref 80.0–100.0)
Platelets: 634 10*3/uL — ABNORMAL HIGH (ref 150–400)
RBC: 4.79 MIL/uL (ref 3.87–5.11)
RDW: 18.4 % — ABNORMAL HIGH (ref 11.5–15.5)
WBC: 9.7 10*3/uL (ref 4.0–10.5)
nRBC: 0 % (ref 0.0–0.2)

## 2020-06-29 LAB — I-STAT BETA HCG BLOOD, ED (MC, WL, AP ONLY): I-stat hCG, quantitative: <5 m[IU]/mL (ref <5)

## 2020-06-29 LAB — LIPASE, BLOOD: Lipase: 53 U/L — ABNORMAL HIGH (ref 11–51)

## 2020-06-29 MED ORDER — PROMETHAZINE HCL 25 MG RE SUPP: 25.0000 mg | Freq: Four times a day (QID) | RECTAL | 0 refills | Status: DC | PRN

## 2020-06-29 MED ORDER — FENTANYL CITRATE (PF) 100 MCG/2ML IJ SOLN
50.0000 ug | Freq: Once | INTRAMUSCULAR | Status: AC
  Administered 2020-06-29: 50 ug via INTRAVENOUS
  Filled 2020-06-29: qty 2

## 2020-06-29 MED ORDER — PROMETHAZINE HCL 25 MG PO TABS: 25.0000 mg | ORAL_TABLET | Freq: Four times a day (QID) | ORAL | 0 refills | Status: DC | PRN

## 2020-06-29 MED ORDER — HYDROMORPHONE HCL 1 MG/ML IJ SOLN
1.0000 mg | Freq: Once | INTRAMUSCULAR | Status: AC
Start: 2020-06-29 — End: 2020-06-29
  Administered 2020-06-29: 1 mg via INTRAMUSCULAR
  Filled 2020-06-29: qty 1

## 2020-06-29 MED ORDER — PROMETHAZINE HCL 25 MG RE SUPP
25.0000 mg | Freq: Four times a day (QID) | RECTAL | Status: DC | PRN
  Administered 2020-06-29: 25 mg via RECTAL
  Filled 2020-06-29 (×2): qty 1

## 2020-06-29 MED ORDER — ONDANSETRON HCL 4 MG/2ML IJ SOLN
4.0000 mg | Freq: Once | INTRAMUSCULAR | Status: AC
  Administered 2020-06-29: 4 mg via INTRAVENOUS
  Filled 2020-06-29: qty 2

## 2020-06-29 MED ORDER — OXYCODONE HCL 5 MG PO TABS: 5.0000 mg | ORAL_TABLET | Freq: Three times a day (TID) | ORAL | 0 refills | Status: DC | PRN

## 2020-06-29 MED ORDER — DIPHENHYDRAMINE HCL 25 MG PO CAPS: 25.0000 mg | ORAL_CAPSULE | ORAL | Status: DC | PRN

## 2020-06-29 MED ORDER — SODIUM CHLORIDE 0.9 % IV BOLUS
1000.0000 mL | Freq: Once | INTRAVENOUS | Status: AC
  Administered 2020-06-29: 1000 mL via INTRAVENOUS

## 2020-06-29 NOTE — Discharge Instructions (Addendum)
List of local women's clinic provided.  Prescription for phenergan suppositories sent to your pharmacy.  Monitor your blood pressure at home, record readings and recheck with your doctor for blood pressure management.   Walk-ins for certain complaints available at:   [facility] [address]  [phone]  See hours at [email or web address]   [facility] [employer] [facility] [employer] [facility]  [place]  ([place] [facility] [employer] at [employer]  [phone]   You can make an appointment to see a GYN provider:   [facility] [place] at [place]  [phone]   [facility] [place] at [facility]  [place]  [phone]   [facility] for [place] at [facility] [place]  [phone]   [facility] [place] at [facility]  [address] [place]  [phone]   If you already have an established OB/GYN provider in the area you can make an appointment with them as well.

## 2020-06-29 NOTE — Patient Instructions (Signed)
Dehydration, Adult Dehydration is condition in which there is not enough water or other fluids in the body. This happens when a person loses more fluids than he or she takes in. Important body parts cannot work right without the right amount of fluids. Any loss of fluids from the body can cause dehydration. Dehydration can be mild, worse, or very bad. It should be treated right away to keep it from getting very bad. What are the causes? This condition may be caused by:  Conditions that cause loss of water or other fluids, such as: ? Watery poop (diarrhea). ? Vomiting. ? Sweating a lot. ? Peeing (urinating) a lot.  Not drinking enough fluids, especially when you: ? Are ill. ? Are doing things that take a lot of energy to do.  Other illnesses and conditions, such as fever or infection.  Certain medicines, such as medicines that take extra fluid out of the body (diuretics).  Lack of safe drinking water.  Not being able to get enough water and food. What increases the risk? The following factors may make you more likely to develop this condition:  Having a long-term (chronic) illness that has not been treated the right way, such as: ? Diabetes. ? Heart disease. ? Kidney disease.  Being 28 years of age or older.  Having a disability.  Living in a place that is high above the ground or sea (high in altitude). The thinner, dried air causes more fluid loss.  Doing exercises that put stress on your body for a long time. What are the signs or symptoms? Symptoms of dehydration depend on how bad it is. Mild or worse dehydration  Thirst.  Dry lips or dry mouth.  Feeling dizzy or light-headed, especially when you stand up from sitting.  Muscle cramps.  Your body making: ? Dark pee (urine). Pee may be the color of tea. ? Less pee than normal. ? Less tears than normal.  Headache. Very bad dehydration  Changes in skin. Skin may: ? Be cold to the touch (clammy). ? Be blotchy  or pale. ? Not go back to normal right after you lightly pinch it and let it go.  Little or no tears, pee, or sweat.  Changes in vital signs, such as: ? Fast breathing. ? Low blood pressure. ? Weak pulse. ? Pulse that is more than 100 beats a minute when you are sitting still.  Other changes, such as: ? Feeling very thirsty. ? Eyes that look hollow (sunken). ? Cold hands and feet. ? Being mixed up (confused). ? Being very tired (lethargic) or having trouble waking from sleep. ? Short-term weight loss. ? Loss of consciousness. How is this treated? Treatment for this condition depends on how bad it is. Treatment should start right away. Do not wait until your condition gets very bad. Very bad dehydration is an emergency. You will need to go to a hospital.  Mild or worse dehydration can be treated at home. You may be asked to: ? Drink more fluids. ? Drink an oral rehydration solution (ORS). This drink helps get the right amounts of fluids and salts and minerals in the blood (electrolytes).  Very bad dehydration can be treated: ? With fluids through an IV tube. ? By getting normal levels of salts and minerals in your blood. This is often done by giving salts and minerals through a tube. The tube is passed through your nose and into your stomach. ? By treating the root cause. Follow these instructions at  home: Oral rehydration solution If told by your doctor, drink an ORS:  Make an ORS. Use instructions on the package.  Start by drinking small amounts, about  cup (120 mL) every 5-10 minutes.  Slowly drink more until you have had the amount that your doctor said to have. Eating and drinking  Drink enough clear fluid to keep your pee pale yellow. If you were told to drink an ORS, finish the ORS first. Then, start slowly drinking other clear fluids. Drink fluids such as: ? Water. Do not drink only water. Doing that can make the salt (sodium) level in your body get too low. ? Water  from ice chips you suck on. ? Fruit juice that you have added water to (diluted). ? Low-calorie sports drinks.  Eat foods that have the right amounts of salts and minerals, such as: ? Bananas. ? Oranges. ? Potatoes. ? Tomatoes. ? Spinach.  Do not drink alcohol.  Avoid: ? Drinks that have a lot of sugar. These include:  High-calorie sports drinks.  Fruit juice that you did not add water to.  Soda.  Caffeine. ? Foods that are greasy or have a lot of fat or sugar.         General instructions  Take over-the-counter and prescription medicines only as told by your doctor.  Do not take salt tablets. Doing that can make the salt level in your body get too high.  Return to your normal activities as told by your doctor. Ask your doctor what activities are safe for you.  Keep all follow-up visits as told by your doctor. This is important. Contact a doctor if:  You have pain in your belly (abdomen) and the pain: ? Gets worse. ? Stays in one place.  You have a rash.  You have a stiff neck.  You get angry or annoyed (irritable) more easily than normal.  You are more tired or have a harder time waking than normal.  You feel: ? Weak or dizzy. ? Very thirsty. Get help right away if you have:  Any symptoms of very bad dehydration.  Symptoms of vomiting, such as: ? You cannot eat or drink without vomiting. ? Your vomiting gets worse or does not go away. ? Your vomit has blood or green stuff in it.  Symptoms that get worse with treatment.  A fever.  A very bad headache.  Problems with peeing or pooping (having a bowel movement), such as: ? Watery poop that gets worse or does not go away. ? Blood in your poop (stool). This may cause poop to look black and tarry. ? Not peeing in 6-8 hours. ? Peeing only a small amount of very dark pee in 6-8 hours.  Trouble breathing. These symptoms may be an emergency. Do not wait to see if the symptoms will go away. Get  medical help right away. Call your local emergency services (911 in the [place] Do not drive yourself to the hospital. Summary  Dehydration is a condition in which there is not enough water or other fluids in the body. This happens when a person loses more fluids than he or she takes in.  Treatment for this condition depends on how bad it is. Treatment should be started right away. Do not wait until your condition gets very bad.  Drink enough clear fluid to keep your pee pale yellow. If you were told to drink an oral rehydration solution (ORS), finish the ORS first. Then, start slowly drinking other clear fluids.  Take over-the-counter and prescription medicines only as told by your doctor.  Get help right away if you have any symptoms of very bad dehydration. This information is not intended to replace advice given to you by your health care provider. Make sure you discuss any questions you have with your health care provider. Document Revised: 08/21/2018 Document Reviewed: 08/21/2018 Elsevier Patient Education  [place].
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
## 2020-06-29 NOTE — ED Notes (Signed)
AVS provided to client, reinforced making a follow up appt with her OB/GYN MD. Also informed client that Rx was sent to her pharmacy listed on the EMR. Opportunity for questions provided prior to DC to home with family

## 2020-06-29 NOTE — Progress Notes (Signed)
[name], twining are scheduled for a virtual visit with your provider today.    Just as we do with appointments in the office, we must obtain your consent to participate.  Your consent will be active for this visit and any virtual visit you may have with one of our providers in the next 365 days.    If you have a MyChart account, I can also send a copy of this consent to you electronically.  All virtual visits are billed to your insurance company just like a traditional visit in the office.  As this is a virtual visit, video technology does not allow for your provider to perform a traditional examination.  This may limit your provider's ability to fully assess your condition.  If your provider identifies any concerns that need to be evaluated in person or the need to arrange testing such as labs, EKG, etc, we will make arrangements to do so.    Although advances in technology are sophisticated, we cannot ensure that it will always work on either your end or our end.  If the connection with a video visit is poor, we may have to switch to a telephone visit.  With either a video or telephone visit, we are not always able to ensure that we have a secure connection.   I need to obtain your verbal consent now.   Are you willing to proceed with your visit today?   [name] has provided verbal consent on 06/29/2020 for a virtual visit (video or telephone).   [name], PA-C 06/29/2020  12:11 PM    MyChart Video Visit    Virtual Visit via Video Note   This visit type was conducted due to national recommendations for restrictions regarding the COVID-19 Pandemic (e.g. social distancing) in an effort to limit this patient's exposure and mitigate transmission in our community. This patient is at least at moderate risk for complications without adequate follow up. This format is felt to be most appropriate for this patient at this time. Physical exam was limited by quality of the video and audio  technology used for the visit.   Patient location: Home Provider location: Home office in [place]  I discussed the limitations of evaluation and management by telemedicine and the availability of in person appointments. The patient expressed understanding and agreed to proceed.  Patient: [name]   DOB: Oct 11, 1995   25 y.o. Female  MRN: [id number] Visit Date: 06/29/2020  Today's healthcare provider: [name], PA-C   No chief complaint on file.  Subjective    HPI  [name] is a 25 yr old female with PMH of SLE, endometriosis, H/O PE on Eliquis, obesity presents today via [facility] for generalized abdominal pain, nausea and vomiting (not improving with phenergan supp), syncopal episode x 1, and elevated BP with dizziness. She has been seen in the ER yesterday and this morning for these same symptoms. She reports symptoms are worsening. Now having headache and dizziness with continued abdominal pain. Reports 1 syncopal episode while in the bathroom. Was noted to have an elevated lipase at 53, had been 45 and 35 2 weeks ago. Does have h/o acute pancreatitis in the past. Had initially felt pain was endometriosis pain, but has continued to progress and become more consistent, instead of waxing and waning. Went to ER early this morning because pain was so intense she could not sleep and was vomiting from the pain.   Patient Active Problem List  Diagnosis Date Noted  . Anxiety   . Hematemesis 06/17/2020  . MDD (major depressive disorder), recurrent severe, without psychosis ([place]) 05/30/2020  . Panic disorder 05/30/2020  . Major depressive disorder 05/29/2020  . Suicide attempt by acetaminophen overdose ([place]) 05/29/2020  . Obesity, Class III, BMI 40-49.9 (morbid obesity) ([place]) 04/05/2020  . SOB (shortness of breath) 04/05/2020  . Abdominal pain 04/04/2020  . SLE (systemic lupus erythematosus related syndrome) ([place]) 04/04/2020  . Shortness of breath 04/04/2020  .  Pneumonia 03/23/2020  . Nausea 03/23/2020  . Asthma 03/22/2020  . Dysuria 02/03/2020  . Hematuria 02/03/2020  . Mass of urinary bladder 01/26/2020  . Allergic rhinitis 05/05/2019  . 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 Endometriosis 03/14/2018  . Long-term use of high-risk medication 07/02/2017  . Lupus anticoagulant positive 07/02/2017  . Personal history of pulmonary embolism 07/02/2017  . Other forms of systemic lupus erythematosus ([place]) 03/12/2017  . Anemia 02/28/2017  . Migraine 02/28/2017  . Dysmenorrhea 08/28/2011  . Gastritis 08/28/2011   Past Medical History:  Diagnosis Date  . Asthma   . Celiac disease   . Collagen vascular disease ([place])   . Diarrhea    Patient mentions diagnosis of ulcerative colitis but it is not clear she actually has UC  . Endometriosis   . Lupus ([place])   . Ovarian cyst   . Panic disorder 05/30/2020  . Pulmonary embolus ([place]) 02/06/2020   Past Surgical History:  Procedure Laterality Date  . APPENDECTOMY    . CHOLECYSTECTOMY    . ESOPHAGOGASTRODUODENOSCOPY (EGD) WITH PROPOFOL N/A 06/18/2020   Procedure: ESOPHAGOGASTRODUODENOSCOPY (EGD) WITH PROPOFOL;  Surgeon: Doran Stabler, MD;  Location: [facility];  Service: Gastroenterology;  Laterality: N/A;  . EXCISION OF ENDOMETRIOMA     Ovarian cyst removal  . excision of endometriosis    . FOOT FRACTURE SURGERY     w/ hardware  . HERNIA REPAIR    . TONSILLECTOMY        Medications: Outpatient Medications Prior to Visit  Medication Sig  . albuterol (VENTOLIN HFA) 108 (90 Base) MCG/ACT inhaler Inhale 2 puffs into the lungs every 6 (six) hours as needed for wheezing or shortness of breath.  Marland Kitchen apixaban (ELIQUIS) 5 MG TABS tablet Take 5 mg by mouth 2 (two) times daily.  Marland Kitchen EPINEPHrine 0.3 mg/0.3 mL IJ SOAJ injection Inject 0.3 mg into the muscle daily as needed (Allergic Reaction).  . hydroxychloroquine (PLAQUENIL) 200 MG tablet Take 2 tablets (400 mg total) by mouth daily. (Patient taking  differently: Take 400 mg by mouth at bedtime.)  . metaxalone (SKELAXIN) 800 MG tablet Take 1 tablet (800 mg total) by mouth 3 (three) times daily.  . norethindrone (AYGESTIN) 5 MG tablet Take 1 tablet (5 mg total) by mouth at bedtime.  . ondansetron (ZOFRAN ODT) 8 MG disintegrating tablet 15m ODT q8 hours prn nausea  . oxyCODONE-acetaminophen (PERCOCET/ROXICET) 5-325 MG tablet Take 1 tablet by mouth every 4 (four) hours as needed for severe pain.  . promethazine (PHENERGAN) 25 MG suppository Place 1 suppository (25 mg total) rectally every 6 (six) hours as needed for nausea or vomiting.  . sertraline (ZOLOFT) 100 MG tablet Take 2 tablets (200 mg total) by mouth at bedtime. For depression  . zolpidem (AMBIEN) 10 MG tablet Take 20 mg by mouth at bedtime.   Facility-Administered Medications Prior to Visit  Medication Dose Route Frequency Provider  . diphenhydrAMINE (BENADRYL) capsule 25 mg  25 mg Oral Q4H PRN MTacy Learn PA-C  .  promethazine (PHENERGAN) suppository 25 mg  25 mg Rectal Q6H PRN Tacy Learn, PA-C    Review of Systems  Constitutional: Positive for appetite change, chills and fatigue. Negative for fever.  Respiratory: Negative.   Cardiovascular: Negative.   Gastrointestinal: Positive for abdominal distention, abdominal pain, nausea and vomiting.  Neurological: Positive for dizziness, syncope, light-headedness and headaches.  Psychiatric/Behavioral: The patient is nervous/anxious.     Last CBC Lab Results  Component Value Date   WBC 9.7 06/29/2020   HGB 10.5 (L) 06/29/2020   HCT 33.7 (L) 06/29/2020   MCV 70.4 (L) 06/29/2020   MCH 21.9 (L) 06/29/2020   RDW 18.4 (H) 06/29/2020   PLT 634 (H) 27/06/2374   Last metabolic panel Lab Results  Component Value Date   GLUCOSE 122 (H) 06/29/2020   NA 137 06/29/2020   K 3.5 06/29/2020   CL 111 06/29/2020   CO2 15 (L) 06/29/2020   BUN 9 06/29/2020   CREATININE 0.79 06/29/2020   GFRNONAA >60 06/29/2020   GFRAA >60  06/18/2019   CALCIUM 9.3 06/29/2020   PROT 7.7 06/29/2020   ALBUMIN 4.2 06/29/2020   BILITOT <0.1 (L) 06/29/2020   ALKPHOS 61 06/29/2020   AST 14 (L) 06/29/2020   ALT 11 06/29/2020   ANIONGAP 11 06/29/2020   Last hemoglobin A1c Lab Results  Component Value Date   HGBA1C 6.3 (H) 05/30/2020      Objective    LMP 06/06/2020  BP Readings from Last 3 Encounters:  06/29/20 (!) 162/100  06/28/20 (!) 131/91  06/18/20 115/63   Wt Readings from Last 3 Encounters:  06/29/20 240 lb (108.9 kg)  06/28/20 240 lb (108.9 kg)  06/17/20 240 lb (108.9 kg)      Physical Exam     Assessment & Plan     1. Elevated lipase - Discussed severity of issue and reviewed patient's labs and imaging. - Advised patient she should seek in-person evaluation again as further imaging and testing may be necessary - She should seek further evaluation also due to new syncope and still having persistent vomiting despite phenergan suppositories, may be getting dehydrated - Patient voiced understanding and agreed to proceed to ER  2. Non-intractable vomiting with nausea, unspecified vomiting type -See above medical treatment plan.  3. Syncope, unspecified syncope type -See above medical treatment plan.  4. Hypertension, unspecified type -See above medical treatment plan.   No follow-ups on file.     I discussed the assessment and treatment plan with the patient. The patient was provided an opportunity to ask questions and all were answered. The patient agreed with the plan and demonstrated an understanding of the instructions.   The patient was advised to call back or seek an in-person evaluation if the symptoms worsen or if the condition fails to improve as anticipated.  I provided 14 minutes of face-to-face time during this encounter via MyChart Video enabled encounter.   [place] [phone] (phone) [phone] (fax)  [place]
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
## 2020-06-29 NOTE — ED Notes (Signed)
Presents with abd pain, nausea and vomiting, onset last PM, states she has a hx of pancreatitis. States that everytime she takes in POs she has vomiting. Placed on cont cardiac monitoring. Skin warm and dry, cap refill WNL, 2 plus radial pulses noted.

## 2020-06-29 NOTE — Discharge Instructions (Addendum)
Follow-up with your OB/GYN.  Take oral Phenergan if able to tolerate.  Take your suppository if needed.  Recommend 1000 mg of Tylenol every 6 hours as needed for pain.  Take Roxicodone for breakthrough pain.

## 2020-06-29 NOTE — ED Triage Notes (Signed)
Pt presents from home, c/o lower abd pain radiating into back as well as n/v. Denies fevers or urinary symptoms. Pt states she has a history of endometriosis.

## 2020-06-29 NOTE — ED Triage Notes (Signed)
Pt was seen today at [facility] ED for vomiting.  Went home and was still vomiting x 2 and she she passed out.

## 2020-07-04 ENCOUNTER — Emergency Department (HOSPITAL_COMMUNITY): Payer: PPO

## 2020-07-04 ENCOUNTER — Emergency Department (HOSPITAL_COMMUNITY)
Admission: EM | Admit: 2020-07-04 | Discharge: 2020-07-04 | Disposition: A | Discharge status: Home / Self Care | Payer: PPO | Attending: Emergency Medicine | Admitting: Emergency Medicine

## 2020-07-04 ENCOUNTER — Encounter (HOSPITAL_COMMUNITY): Payer: Self-pay | Admitting: Radiology

## 2020-07-04 DIAGNOSIS — R109 Unspecified abdominal pain: Secondary | ICD-10-CM

## 2020-07-04 DIAGNOSIS — G8929 Other chronic pain: Secondary | ICD-10-CM

## 2020-07-04 DIAGNOSIS — Z9049 Acquired absence of other specified parts of digestive tract: Secondary | ICD-10-CM | POA: Diagnosis not present

## 2020-07-04 DIAGNOSIS — R079 Chest pain, unspecified: Secondary | ICD-10-CM | POA: Insufficient documentation

## 2020-07-04 DIAGNOSIS — Z91010 Allergy to peanuts: Secondary | ICD-10-CM | POA: Insufficient documentation

## 2020-07-04 DIAGNOSIS — J45909 Unspecified asthma, uncomplicated: Secondary | ICD-10-CM | POA: Diagnosis not present

## 2020-07-04 DIAGNOSIS — Z7901 Long term (current) use of anticoagulants: Secondary | ICD-10-CM | POA: Insufficient documentation

## 2020-07-04 DIAGNOSIS — R1031 Right lower quadrant pain: Secondary | ICD-10-CM | POA: Insufficient documentation

## 2020-07-04 LAB — COMPREHENSIVE METABOLIC PANEL
ALT: 18 U/L (ref 0–44)
AST: 21 U/L (ref 15–41)
Albumin: 3.8 g/dL (ref 3.5–5.0)
Alkaline Phosphatase: 73 U/L (ref 38–126)
Anion gap: 6 (ref 5–15)
BUN: <5 mg/dL — ABNORMAL LOW (ref 6–20)
CO2: 18 mmol/L — ABNORMAL LOW (ref 22–32)
Calcium: 8.6 mg/dL — ABNORMAL LOW (ref 8.9–10.3)
Chloride: 116 mmol/L — ABNORMAL HIGH (ref 98–111)
Creatinine, Ser: 0.63 mg/dL (ref 0.44–1.00)
GFR, Estimated: >60 mL/min (ref >60)
Glucose, Bld: 125 mg/dL — ABNORMAL HIGH (ref 70–99)
Potassium: 3.4 mmol/L — ABNORMAL LOW (ref 3.5–5.1)
Sodium: 140 mmol/L (ref 135–145)
Total Bilirubin: 0.1 mg/dL — ABNORMAL LOW (ref 0.3–1.2)
Total Protein: 7 g/dL (ref 6.5–8.1)

## 2020-07-04 LAB — CBC
HCT: 31.7 % — ABNORMAL LOW (ref 36.0–46.0)
Hemoglobin: 9.7 g/dL — ABNORMAL LOW (ref 12.0–15.0)
MCH: 21.7 pg — ABNORMAL LOW (ref 26.0–34.0)
MCHC: 30.6 g/dL (ref 30.0–36.0)
MCV: 71.1 fL — ABNORMAL LOW (ref 80.0–100.0)
Platelets: 582 10*3/uL — ABNORMAL HIGH (ref 150–400)
RBC: 4.46 MIL/uL (ref 3.87–5.11)
RDW: 18.9 % — ABNORMAL HIGH (ref 11.5–15.5)
WBC: 7.2 10*3/uL (ref 4.0–10.5)
nRBC: 0 % (ref 0.0–0.2)

## 2020-07-04 LAB — LIPASE, BLOOD: Lipase: 41 U/L (ref 11–51)

## 2020-07-04 LAB — I-STAT BETA HCG BLOOD, ED (MC, WL, AP ONLY): I-stat hCG, quantitative: <5 m[IU]/mL (ref <5)

## 2020-07-04 MED ORDER — DIPHENHYDRAMINE HCL 50 MG/ML IJ SOLN
25.0000 mg | Freq: Once | INTRAMUSCULAR | Status: AC
  Administered 2020-07-04: 25 mg via INTRAVENOUS
  Filled 2020-07-04: qty 1

## 2020-07-04 MED ORDER — HYDROMORPHONE HCL 1 MG/ML IJ SOLN
1.0000 mg | Freq: Once | INTRAMUSCULAR | Status: AC
  Administered 2020-07-04: 1 mg via INTRAVENOUS
  Filled 2020-07-04: qty 1

## 2020-07-04 MED ORDER — LACTATED RINGERS IV BOLUS
1000.0000 mL | Freq: Once | INTRAVENOUS | Status: AC
  Administered 2020-07-04: 1000 mL via INTRAVENOUS

## 2020-07-04 MED ORDER — LACTATED RINGERS IV SOLN: INTRAVENOUS | Status: DC

## 2020-07-04 MED ORDER — DIPHENHYDRAMINE HCL 50 MG/ML IJ SOLN
12.5000 mg | Freq: Once | INTRAMUSCULAR | Status: AC
  Administered 2020-07-04: 12.5 mg via INTRAVENOUS
  Filled 2020-07-04: qty 1

## 2020-07-04 MED ORDER — ONDANSETRON HCL 4 MG/2ML IJ SOLN
4.0000 mg | Freq: Once | INTRAMUSCULAR | Status: AC
  Administered 2020-07-04: 4 mg via INTRAVENOUS
  Filled 2020-07-04: qty 2

## 2020-07-04 NOTE — ED Triage Notes (Signed)
Pt came in with multiple complaints. Pt states for the last 24 hours she has had abdominal pain lower quadrants radiating to her back. Endorses n/v. Pt also states that she has chest pain and SOB and has passed out four times today.

## 2020-07-04 NOTE — ED Notes (Signed)
Patient transported to X-ray 

## 2020-07-04 NOTE — ED Notes (Signed)
Pt states she has prior hx of PE and she has not been taking her blood thinner because it is too much work

## 2020-07-04 NOTE — ED Notes (Signed)
ED Provider at bedside. 

## 2020-07-04 NOTE — ED Provider Notes (Signed)
[place] DEPT Provider Note   CSN: [id number] Arrival date & time: 07/04/20  0454     History Chief Complaint  Patient presents with   Abdominal Pain   Chest Pain    [name] is a 25 y.o. female.  25 year old female presents with chronic abdominal pain related to endometriosis likely.  Patient states that it became worse yesterday evening and has been unresponsive to her home medications.  Also notes she does have an ovarian cyst as well.  Patient states that she cannot have a pelvic ultrasound due to severe pain.  Review of patient's medical record shows that she is had a few ED visits for similar issues and has been referred to her OB/GYN for definitive care.  She does have a prior history of surgery for endometriosis.  She currently denies any urinary symptoms.  She is on Eliquis for history of PE.  Denies any PE like symptoms.      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   Panic disorder 05/30/2020   Major depressive disorder 05/29/2020   Suicide attempt by acetaminophen overdose ([place]) 05/29/2020   Obesity, Class III, BMI 40-49.9 (morbid obesity) ([place]) 04/05/2020   SOB (shortness of breath) 04/05/2020   Abdominal pain 04/04/2020   SLE (systemic lupus erythematosus related syndrome) ([place]) 04/04/2020   Shortness of breath 04/04/2020   Pneumonia 03/23/2020   Nausea 03/23/2020   Asthma 03/22/2020   Dysuria 02/03/2020   Hematuria 02/03/2020   Mass of urinary bladder 01/26/2020    Allergic rhinitis 05/05/2019   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   Endometriosis 03/14/2018   Long-term use of high-risk medication 07/02/2017   Lupus anticoagulant positive 07/02/2017   Personal history of pulmonary embolism 07/02/2017   Other forms of systemic lupus erythematosus ([place]) 03/12/2017   Anemia 02/28/2017   Migraine 02/28/2017   Dysmenorrhea 08/28/2011   Gastritis 08/28/2011    Past Surgical History:  Procedure Laterality Date   APPENDECTOMY     CHOLECYSTECTOMY     ESOPHAGOGASTRODUODENOSCOPY (EGD) WITH PROPOFOL N/A 06/18/2020   Procedure: ESOPHAGOGASTRODUODENOSCOPY (EGD) WITH PROPOFOL;  Surgeon: Doran Stabler, MD;  Location: [facility];  Service: Gastroenterology;  Laterality: N/A;   EXCISION OF ENDOMETRIOMA     Ovarian cyst removal   excision of endometriosis     FOOT FRACTURE SURGERY     w/ hardware   HERNIA REPAIR     TONSILLECTOMY       OB History   No obstetric history on file.     Family History  Problem Relation Age of Onset   Hypertension Mother    Hypercholesterolemia Mother    Rheum arthritis Mother    Diabetes Father    Hypertension Father    Cancer Father    Autism Brother     Social History   Tobacco Use   Smoking status: Never   Smokeless tobacco: Never  Vaping Use   Vaping Use: Never used  Substance Use Topics   Alcohol use: Never   Drug use: Never    Home Medications Prior to Admission medications   Medication Sig Start Date End Date Taking? Authorizing Provider  apixaban (ELIQUIS) 5 MG TABS tablet Take 5 mg by mouth 2 (two) times daily.    [provider]  EPINEPHrine 0.3 mg/0.3 mL IJ SOAJ injection Inject 0.3 mg into the muscle daily as needed (Allergic Reaction). 06/01/20   Lindell Spar I, NP  hydroxychloroquine (PLAQUENIL) 200 MG tablet Take 2 tablets (400 mg total) by mouth daily. Patient taking differently: Take 400 mg by mouth at bedtime. 03/23/20   Rice, Resa Miner, MD  norethindrone (AYGESTIN) 5 MG  tablet Take 1 tablet (5 mg total) by mouth at bedtime. 06/18/20   Carollee Leitz, MD  oxyCODONE (ROXICODONE) 5 MG immediate release tablet Take 1 tablet (5 mg total) by mouth every 8 (eight) hours as needed for up to 10 doses for severe pain. 06/29/20   Curatolo, Adam, DO  promethazine (PHENERGAN) 25 MG suppository Place 1 suppository (25 mg total) rectally every 6 (six) hours as needed for nausea or vomiting. 06/29/20   Tacy Learn, PA-C  promethazine (PHENERGAN) 25 MG tablet Take 1 tablet (25 mg total) by mouth every 6 (six) hours as needed for nausea or vomiting. 06/29/20   Curatolo, Adam, DO  sertraline (ZOLOFT) 100 MG tablet Take 2 tablets (200 mg total) by mouth at bedtime. For depression 06/01/20   Lindell Spar I, NP  zolpidem (AMBIEN) 10 MG tablet Take 20 mg by mouth at bedtime. 06/02/20   [provider]  dicyclomine (BENTYL) 20 MG tablet Take 1 tablet (20 mg total) by mouth 2 (two) times daily. 11/29/18 04/11/19  Muthersbaugh, Jarrett Soho, PA-C  Fluticasone Propionate HFA (FLOVENT HFA IN) Inhale into the lungs. Patient not taking: Reported on 11/14/2019  11/14/19  [provider]    Allergies    Azithromycin, Peanut-containing drug products, Iodinated diagnostic agents, Other, Amoxicillin, Fish allergy, Gluten meal, Hydromet [hydrocodone bit-homatrop mbr], Ibuprofen, Lactose intolerance (gi), Morphine and related, and Pantoprazole  Review of Systems   Review of Systems  All other systems reviewed and are negative.  Physical Exam Updated Vital Signs BP (!) 118/102 (BP Location: Right Arm)   Pulse (!) 104   Temp 98.4 F (36.9 C) (Oral)   Resp 16   LMP 06/06/2020   SpO2 100%   Physical Exam Vitals and nursing note reviewed.  Constitutional:      General: She is not in acute distress.    Appearance: Normal appearance. She is well-developed. She is not toxic-appearing.  HENT:     Head: Normocephalic and atraumatic.  Eyes:     General: Lids are normal.      Conjunctiva/sclera: Conjunctivae normal.     Pupils: Pupils are equal, round, and reactive to light.  Neck:     Thyroid: No thyroid mass.     Trachea: No tracheal deviation.  Cardiovascular:     Rate and Rhythm: Normal rate and regular rhythm.     Heart sounds: Normal heart sounds. No murmur heard.   No gallop.  Pulmonary:     Effort: Pulmonary effort is normal. No respiratory distress.     Breath sounds: Normal breath sounds. No stridor. No decreased breath sounds, wheezing, rhonchi or rales.  Abdominal:     General: There is no distension.     Palpations: Abdomen is soft.     Tenderness: There is abdominal tenderness in the right  lower quadrant and suprapubic area. There is no guarding or rebound.  Musculoskeletal:        General: No tenderness. Normal range of motion.     Cervical back: Normal range of motion and neck supple.  Skin:    General: Skin is warm and dry.     Findings: No abrasion or rash.  Neurological:     Mental Status: She is alert and oriented to person, place, and time. Mental status is at baseline.     GCS: GCS eye subscore is 4. GCS verbal subscore is 5. GCS motor subscore is 6.     Cranial Nerves: Cranial nerves are intact. No cranial nerve deficit.     Sensory: No sensory deficit.     Motor: Motor function is intact.  Psychiatric:        Attention and Perception: Attention normal.        Speech: Speech normal.        Behavior: Behavior normal.    ED Results / Procedures / Treatments   Labs (all labs ordered are listed, but only abnormal results are displayed) Labs Reviewed  COMPREHENSIVE METABOLIC PANEL - Abnormal; Notable for the following components:      Result Value   Potassium 3.4 (*)    Chloride 116 (*)    CO2 18 (*)    Glucose, Bld 125 (*)    BUN <5 (*)    Calcium 8.6 (*)    Total Bilirubin 0.1 (*)    All other components within normal limits  CBC - Abnormal; Notable for the following components:   Hemoglobin 9.7 (*)    HCT 31.7 (*)     MCV 71.1 (*)    MCH 21.7 (*)    RDW 18.9 (*)    Platelets 582 (*)    All other components within normal limits  LIPASE, BLOOD  I-STAT BETA HCG BLOOD, ED ([facility], [facility], [facility] ONLY)    EKG EKG Interpretation  Date/Time:  Monday July 04 2020 05:11:13 EDT Ventricular Rate:  111 PR Interval:  133 QRS Duration: 94 QT Interval:  345 QTC Calculation: 469 R Axis:   87 Text Interpretation: Sinus tachycardia Low voltage, precordial leads Borderline T abnormalities, diffuse leads Borderline ST elevation, lateral leads 12 Lead; [place] Confirmed by Ripley Fraise [phone]) on 07/04/2020 5:26:22 AM  Radiology DG Chest 2 View  Result Date: 07/04/2020 CLINICAL DATA:  Chest pain EXAM: CHEST - 2 VIEW COMPARISON:  Radiograph 06/12/2020 FINDINGS: Accounting for body habitus, the lungs are clear. No consolidation, features of edema, pneumothorax, or effusion. Pulmonary vascularity is normally distributed. The cardiomediastinal contours are unremarkable. No acute osseous or soft tissue abnormality. Cholecystectomy clips in the right upper quadrant IMPRESSION: No acute cardiopulmonary abnormality. Electronically Signed   By: Lovena Le M.D.   On: 07/04/2020 05:39    Procedures Procedures   Medications Ordered in ED Medications  HYDROmorphone (DILAUDID) injection 1 mg (has no administration in time range)  diphenhydrAMINE (BENADRYL) injection 25 mg (has no administration in time range)  ondansetron (ZOFRAN) injection 4 mg (has no administration in time range)    ED Course  I have reviewed the triage vital signs and the nursing notes.  Pertinent labs & imaging results that were available during my care of the patient were reviewed by me and considered in my medical decision making (see chart for details).    MDM Rules/Calculators/A&P  Patient is abdominal CT and labs are reassuring here.  Given IV fluids along with pain medication.  Patient has extensive history of chronic  abdominal pain.  She was encouraged to follow-up with her GYN doctor Final Clinical Impression(s) / ED Diagnoses Final diagnoses:  None    Rx / DC Orders ED Discharge Orders     None        Lacretia Leigh, MD 07/04/20 [phone]

## 2020-07-04 NOTE — ED Notes (Signed)
Patient transported to CT 

## 2020-07-05 ENCOUNTER — Ambulatory Visit (HOSPITAL_COMMUNITY)
Admission: EM | Admit: 2020-07-05 | Discharge: 2020-07-05 | Disposition: A | Discharge status: Home / Self Care | Payer: PPO | Attending: Internal Medicine | Admitting: Internal Medicine

## 2020-07-05 ENCOUNTER — Other Ambulatory Visit: Payer: Self-pay

## 2020-07-05 ENCOUNTER — Encounter (HOSPITAL_COMMUNITY): Payer: Self-pay | Admitting: Emergency Medicine

## 2020-07-05 DIAGNOSIS — Z7901 Long term (current) use of anticoagulants: Secondary | ICD-10-CM

## 2020-07-05 DIAGNOSIS — Z86711 Personal history of pulmonary embolism: Secondary | ICD-10-CM

## 2020-07-05 DIAGNOSIS — Z8616 Personal history of COVID-19: Secondary | ICD-10-CM | POA: Diagnosis not present

## 2020-07-05 DIAGNOSIS — U071 COVID-19: Secondary | ICD-10-CM

## 2020-07-05 DIAGNOSIS — Z8701 Personal history of pneumonia (recurrent): Secondary | ICD-10-CM

## 2020-07-05 DIAGNOSIS — N809 Endometriosis, unspecified: Secondary | ICD-10-CM

## 2020-07-05 DIAGNOSIS — Z889 Allergy status to unspecified drugs, medicaments and biological substances status: Secondary | ICD-10-CM

## 2020-07-05 MED ORDER — ONDANSETRON 4 MG PO TBDP
4.0000 mg | ORAL_TABLET | Freq: Once | ORAL | Status: AC
  Administered 2020-07-05: 4 mg via ORAL

## 2020-07-05 MED ORDER — DOXYCYCLINE HYCLATE 100 MG PO CAPS: 100.0000 mg | ORAL_CAPSULE | Freq: Two times a day (BID) | ORAL | 0 refills | Status: AC

## 2020-07-05 MED ORDER — HYDROCOD POLST-CPM POLST ER 10-8 MG/5ML PO SUER: 5.0000 mL | Freq: Two times a day (BID) | ORAL | 0 refills | Status: DC | PRN

## 2020-07-05 MED ORDER — ONDANSETRON 4 MG PO TBDP
ORAL_TABLET | ORAL | Status: AC
  Filled 2020-07-05: qty 1

## 2020-07-05 NOTE — ED Triage Notes (Signed)
Patient presents to [facility] for evaluation after she noted she was throwing up a lot more than normal at the ER, having fever, fatigue and cough.  Patient states she has had 6+ COVID home tests today.  Hx of Lupus, and COVID pneumonia.

## 2020-07-05 NOTE — Discharge Instructions (Addendum)
-  Doxycycline twice daily for 7 days.  Make sure to use contraception while taking this medication as it is not safe for developing fetus. -Tussionex cough syrup 1-2x daily. This medication can cause drowsiness so take at bedtime and do not drive after taking.  -Continue albuterol nebulizer solution. -For fevers/chills, bodyaches, headaches- Tylenol. Avoid ibuprofen given history PE and anticoagulation long term.  -Seek additional immediate medical attention if you develop new symptoms like worsening shortness of breath, wheezing, dizziness, chest pain, leg swelling, severe headaches, weakness. -With a virus, you're typically contagious for 5-7 days, or as long as you're having fevers.

## 2020-07-05 NOTE — ED Provider Notes (Signed)
[facility]    CSN: [id number] Arrival date & time: 07/05/20  1819      History   Chief Complaint Chief Complaint  Patient presents with   COVID +    HPI [name] is a 25 y.o. female presenting with nausea and vomiting following 6 positive home COVID tests.  Medical history asthma, celiac disease, lupus, ovarian cyst, panic disorder, PE.  History COVID-19 pneumonia requiring hospitalization.  Asthma controlled on albuterol neublizer.  She was evaluated in the emergency department for her chronic abdominal issues and endometriosis 1 day ago, has been referred to OB/GYN for her chronic endometriosis.  She is also on Eliquis for history of pulmonary embolism.   Abdominal CT and labs were performed 1 day ago which was reassuring.  Today presenting with 2 days of nausea with vomiting and occasional wheezing and shortness of breath relieved by albuterol nebulizer. Generalized crampy abd pain. Occ diarrhea. Nonproductive cough. Temperature running 62F, has taken tylenol for relief. Last dose of antipyretic was 4 hours ago.  She has coughed so hard she vomited. Today denies shortness of breath, dizziness, chest pain, leg swelling. 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   Panic disorder 05/30/2020   Major depressive disorder 05/29/2020   Suicide attempt by acetaminophen overdose ([place]) 05/29/2020   Obesity, Class III, BMI 40-49.9 (morbid obesity) ([place]) 04/05/2020   SOB  (shortness of breath) 04/05/2020   Abdominal pain 04/04/2020   SLE (systemic lupus erythematosus related syndrome) ([place]) 04/04/2020   Shortness of breath 04/04/2020   Pneumonia 03/23/2020   Nausea 03/23/2020   Asthma 03/22/2020   Dysuria 02/03/2020   Hematuria 02/03/2020   Mass of urinary bladder 01/26/2020   Allergic rhinitis 05/05/2019   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   Endometriosis 03/14/2018   Long-term use of high-risk medication 07/02/2017   Lupus anticoagulant positive 07/02/2017   Personal history of pulmonary embolism 07/02/2017   Other forms of systemic lupus erythematosus ([place]) 03/12/2017   Anemia 02/28/2017   Migraine 02/28/2017   Dysmenorrhea 08/28/2011   Gastritis 08/28/2011    Past Surgical History:  Procedure Laterality Date   APPENDECTOMY     CHOLECYSTECTOMY     ESOPHAGOGASTRODUODENOSCOPY (EGD) WITH PROPOFOL N/A 06/18/2020   Procedure: ESOPHAGOGASTRODUODENOSCOPY (EGD) WITH PROPOFOL;  Surgeon: Doran Stabler, MD;  Location: [facility];  Service: Gastroenterology;  Laterality: N/A;   EXCISION OF ENDOMETRIOMA     Ovarian cyst removal   excision of endometriosis     FOOT FRACTURE SURGERY     w/ hardware   HERNIA REPAIR     TONSILLECTOMY      OB History   No obstetric history on file.      Home Medications    Prior to Admission medications   Medication Sig Start Date End Date Taking? Authorizing Provider  chlorpheniramine-HYDROcodone (TUSSIONEX PENNKINETIC ER) 10-8 MG/5ML SUER Take 5 mLs by mouth every 12 (  twelve) hours as needed for cough. 07/05/20  Yes Hazel Sams, PA-C  doxycycline (VIBRAMYCIN) 100 MG capsule Take 1 capsule (100 mg total) by mouth 2 (two) times daily for 7 days. 07/05/20 07/12/20 Yes Hazel Sams, PA-C  apixaban (ELIQUIS) 5 MG TABS tablet Take 5 mg by mouth 2 (two) times daily.    [provider]  EPINEPHrine 0.3 mg/0.3 mL IJ SOAJ injection Inject 0.3 mg into the muscle daily as needed (Allergic Reaction). 06/01/20    Lindell Spar I, NP  hydroxychloroquine (PLAQUENIL) 200 MG tablet Take 2 tablets (400 mg total) by mouth daily. Patient taking differently: Take 400 mg by mouth at bedtime. 03/23/20   Rice, Resa Miner, MD  norethindrone (AYGESTIN) 5 MG tablet Take 1 tablet (5 mg total) by mouth at bedtime. 06/18/20   Carollee Leitz, MD  oxyCODONE (ROXICODONE) 5 MG immediate release tablet Take 1 tablet (5 mg total) by mouth every 8 (eight) hours as needed for up to 10 doses for severe pain. 06/29/20   Curatolo, Adam, DO  promethazine (PHENERGAN) 25 MG suppository Place 1 suppository (25 mg total) rectally every 6 (six) hours as needed for nausea or vomiting. 06/29/20   Tacy Learn, PA-C  promethazine (PHENERGAN) 25 MG tablet Take 1 tablet (25 mg total) by mouth every 6 (six) hours as needed for nausea or vomiting. 06/29/20   Curatolo, Adam, DO  sertraline (ZOLOFT) 100 MG tablet Take 2 tablets (200 mg total) by mouth at bedtime. For depression 06/01/20   Lindell Spar I, NP  zolpidem (AMBIEN) 10 MG tablet Take 20 mg by mouth at bedtime. 06/02/20   [provider]  dicyclomine (BENTYL) 20 MG tablet Take 1 tablet (20 mg total) by mouth 2 (two) times daily. 11/29/18 04/11/19  Muthersbaugh, Jarrett Soho, PA-C  Fluticasone Propionate HFA (FLOVENT HFA IN) Inhale into the lungs. Patient not taking: Reported on 11/14/2019  11/14/19  [provider]    Family History Family History  Problem Relation Age of Onset   Hypertension Mother    Hypercholesterolemia Mother    Rheum arthritis Mother    Diabetes Father    Hypertension Father    Cancer Father    Autism Brother     Social History Social History   Tobacco Use   Smoking status: Never   Smokeless tobacco: Never  Vaping Use   Vaping Use: Never used  Substance Use Topics   Alcohol use: Never   Drug use: Never     Allergies   Azithromycin, Peanut-containing drug products, Iodinated diagnostic agents, Other, Amoxicillin, Fish allergy, Gluten meal,  Hydromet [hydrocodone bit-homatrop mbr], Ibuprofen, Lactose intolerance (gi), Morphine and related, and Pantoprazole   Review of Systems Review of Systems  Constitutional:  Negative for appetite change, chills and fever.  HENT:  Positive for congestion. Negative for ear pain, rhinorrhea, sinus pressure, sinus pain and sore throat.   Eyes:  Negative for redness and visual disturbance.  Respiratory:  Positive for cough. Negative for chest tightness, shortness of breath and wheezing.   Cardiovascular:  Negative for chest pain and palpitations.  Gastrointestinal:  Positive for abdominal pain, diarrhea, nausea and vomiting. Negative for constipation.  Genitourinary:  Negative for dysuria, frequency and urgency.  Musculoskeletal:  Negative for myalgias.  Neurological:  Negative for dizziness, weakness and headaches.  Psychiatric/Behavioral:  Negative for confusion.   All other systems reviewed and are negative.   Physical Exam Triage Vital Signs ED Triage Vitals  Enc Vitals Group  BP 07/05/20 1922 125/79     Pulse Rate 07/05/20 1922 (!) 102     Resp 07/05/20 1922 18     Temp 07/05/20 1922 98.8 F (37.1 C)     Temp Source 07/05/20 1922 Oral     SpO2 07/05/20 1922 98 %     Weight --      Height --      Head Circumference --      Peak Flow --      Pain Score 07/05/20 1924 7     Pain Loc --      Pain Edu? --      Excl. in [place]? --    No data found.  Updated Vital Signs BP 125/79 (BP Location: Right Wrist)   Pulse (!) 102   Temp 98.8 F (37.1 C) (Oral)   Resp 18   LMP 06/06/2020 Comment: negative HCG blood test 07-04-2020  SpO2 98% Comment: fluctuating between 91-98%  Visual Acuity Right Eye Distance:   Left Eye Distance:   Bilateral Distance:    Right Eye Near:   Left Eye Near:    Bilateral Near:     Physical Exam Vitals reviewed.  Constitutional:      General: She is not in acute distress.    Appearance: Normal appearance. She is not ill-appearing.      Interventions: She is not intubated. HENT:     Head: Normocephalic and atraumatic.     Right Ear: Hearing, tympanic membrane, ear canal and external ear normal. No swelling or tenderness. There is no impacted cerumen. No mastoid tenderness. Tympanic membrane is not perforated, erythematous, retracted or bulging.     Left Ear: Hearing, tympanic membrane, ear canal and external ear normal. No swelling or tenderness. There is no impacted cerumen. No mastoid tenderness. Tympanic membrane is not perforated, erythematous, retracted or bulging.     Nose:     Right Sinus: No maxillary sinus tenderness or frontal sinus tenderness.     Left Sinus: No maxillary sinus tenderness or frontal sinus tenderness.     Mouth/Throat:     Mouth: Mucous membranes are moist.     Pharynx: Uvula midline. No oropharyngeal exudate or posterior oropharyngeal erythema.     Tonsils: No tonsillar exudate.  Cardiovascular:     Rate and Rhythm: Normal rate and regular rhythm.     Heart sounds: Normal heart sounds.  Pulmonary:     Effort: Pulmonary effort is normal. No tachypnea, bradypnea, accessory muscle usage, prolonged expiration, respiratory distress or retractions. She is not intubated.     Breath sounds: Normal breath sounds and air entry. No wheezing, rhonchi or rales.     Comments: Occ cough No wheezes rhonchi or rales Chest:     Chest wall: No tenderness.  Abdominal:     General: Abdomen is flat. Bowel sounds are normal.     Tenderness: There is no abdominal tenderness. There is no guarding or rebound.  Lymphadenopathy:     Cervical: No cervical adenopathy.  Neurological:     General: No focal deficit present.     Mental Status: She is alert and oriented to person, place, and time.  Psychiatric:        Attention and Perception: Attention and perception normal.        Mood and Affect: Mood and affect normal.        Behavior: Behavior normal. Behavior is cooperative.        Thought Content: Thought content  normal.  Judgment: Judgment normal.     UC Treatments / Results  Labs (all labs ordered are listed, but only abnormal results are displayed) Labs Reviewed - No data to display  EKG   Radiology No results found.  Procedures Procedures (including critical care time)  Medications Ordered in UC Medications  ondansetron (ZOFRAN-ODT) disintegrating tablet 4 mg (4 mg Oral Given 07/05/20 1948)    Initial Impression / Assessment and Plan / UC Course  I have reviewed the triage vital signs and the nursing notes.  Pertinent labs & imaging results that were available during my care of the patient were reviewed by me and considered in my medical decision making (see chart for details).     This patient is a 25 year old female presenting with COVID-19. History COVID-19 3 times in the past per pt. Today this pt is borderline tachycardic but afebrile nontachypneic, oxygenating well on room air, no wheezes rhonchi or rales.  Last dose of antipyretic was 6 hours ago.  She was evaluated in the emergency department for this abdominal issue 1 day ago, has been referred to OB/GYN for her chronic endometriosis and abd pain.  She is on Eliquis for history of pulmonary embolism.  Today denies shortness of breath, dizziness, chest pain, leg swelling.  Abdominal CT and labs were performed 1 day ago which was reassuring.  States she is not pregnant or breast-feeding.  Benign lung exam today, but given history of COVID-pneumonia and hospitalization due to this, and patient concern, plan to treat prophylactically with doxycycline as below. Patient with multiple medication and antibiotic allergies. For asthma, continue albuterol nebulizer.  She states Tussionex is the only cough syrup that works for her, sent as below. She states this does not interact with her allergies and she took it most recently 03/19/2020 without issue.  History of PE, continue Eliquis  Declines COVID PCR today.  Strict ED  return precautions discussed, patient verbalizes understanding and agreement.  Coding this visit a Level 4 given chronic illness with exacerbation (asthma) and acute illness with systemic symptoms (covid-19), prescription drug management.   Final Clinical Impressions(s) / UC Diagnoses   Final diagnoses:  [id number]  History of COVID-19  History of bacterial pneumonia  History of pulmonary embolism  Long term current use of anticoagulant therapy  Endometriosis  Multiple drug allergies     Discharge Instructions      -Doxycycline twice daily for 7 days.  Make sure to use contraception while taking this medication as it is not safe for developing fetus. -Tussionex cough syrup 1-2x daily. This medication can cause drowsiness so take at bedtime and do not drive after taking.  -Continue albuterol nebulizer solution. -For fevers/chills, bodyaches, headaches- Tylenol. Avoid ibuprofen given history PE and anticoagulation long term.  -Seek additional immediate medical attention if you develop new symptoms like worsening shortness of breath, wheezing, dizziness, chest pain, leg swelling, severe headaches, weakness. -With a virus, you're typically contagious for 5-7 days, or as long as you're having fevers.       ED Prescriptions     Medication Sig Dispense Auth. Provider   doxycycline (VIBRAMYCIN) 100 MG capsule Take 1 capsule (100 mg total) by mouth 2 (two) times daily for 7 days. 14 capsule Hazel Sams, PA-C   chlorpheniramine-HYDROcodone [facility] ER) 10-8 MG/5ML SUER Take 5 mLs by mouth every 12 (twelve) hours as needed for cough. 140 mL Hazel Sams, PA-C      I have reviewed the PDMP during this encounter.  Hazel Sams, PA-C 07/08/20 [phone]

## 2020-08-05 DIAGNOSIS — R000 Tachycardia, unspecified: Secondary | ICD-10-CM | POA: Diagnosis present

## 2021-04-16 ENCOUNTER — Other Ambulatory Visit: Payer: Self-pay

## 2021-04-16 ENCOUNTER — Encounter (HOSPITAL_COMMUNITY): Payer: Self-pay

## 2021-04-16 ENCOUNTER — Ambulatory Visit (HOSPITAL_COMMUNITY)
Admission: EM | Admit: 2021-04-16 | Discharge: 2021-04-16 | Disposition: A | Discharge status: Home / Self Care | Payer: PPO | Attending: Family Medicine | Admitting: Family Medicine

## 2021-04-16 DIAGNOSIS — J069 Acute upper respiratory infection, unspecified: Secondary | ICD-10-CM | POA: Insufficient documentation

## 2021-04-16 DIAGNOSIS — Z20822 Contact with and (suspected) exposure to covid-19: Secondary | ICD-10-CM | POA: Insufficient documentation

## 2021-04-16 MED ORDER — PROMETHAZINE-DM 6.25-15 MG/5ML PO SYRP: 5.0000 mL | ORAL_SOLUTION | Freq: Four times a day (QID) | ORAL | 0 refills | Status: DC | PRN

## 2021-04-16 NOTE — ED Triage Notes (Signed)
Pt presents with c/o fever and cough x Thursday.  ? ?Pt states she has taken Tessalon and OTC cough medicine. States nothing is working.  ?

## 2021-04-16 NOTE — ED Provider Notes (Signed)
?[facility] ? ? ? ?CSN: [id number] ?Arrival date & time: 04/16/21  1722 ? ? ?  ? ?History   ?Chief Complaint ?Chief Complaint  ?Patient presents with  ? Cough  ? Fever  ? ? ?HPI ?[name] is a 26 y.o. female.  ? ?Patient presents with fever, cough, nasal congestion that started approximately 4 days ago.  Tmax at home was 103.  Patient denies any known sick contacts but reports that she has been traveling in airports lately.  Patient has taken [employer], over-the-counter cough medication, allergy medication with no improvement in symptoms.  She is requesting prescription cough medication.  Denies chest pain, shortness of breath, sore throat, ear pain, nausea, vomiting, diarrhea, abdominal pain. ? ? ?Cough ?Fever ? ?Past Medical History:  ?Diagnosis Date  ? Asthma   ? Celiac disease   ? Collagen vascular disease ([place])   ? Diarrhea   ? Patient mentions diagnosis of ulcerative colitis but it is not clear she actually has UC  ? Endometriosis   ? Lupus ([place])   ? Ovarian cyst   ? Panic disorder 05/30/2020  ? Pulmonary embolus ([place]) 02/06/2020  ? ? ?Patient Active Problem List  ? Diagnosis Date Noted  ? Anxiety   ? Hematemesis 06/17/2020  ? 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 Panic disorder 05/30/2020  ? Major depressive disorder 05/29/2020  ? Suicide attempt by acetaminophen overdose ([place]) 05/29/2020  ? Obesity, Class III, BMI 40-49.9 (morbid obesity) ([place]) 04/05/2020  ? SOB (shortness of breath) 04/05/2020  ? Abdominal pain 04/04/2020  ? SLE (systemic lupus erythematosus related syndrome) ([place]) 04/04/2020  ? Shortness of breath 04/04/2020  ? Pneumonia 03/23/2020  ? Nausea 03/23/2020  ? Asthma 03/22/2020  ? Dysuria 02/03/2020  ? Hematuria 02/03/2020  ? Mass of urinary bladder 01/26/2020  ? Allergic rhinitis 05/05/2019  ? 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 Endometriosis 03/14/2018  ? Long-term use of high-risk medication 07/02/2017  ? Lupus  anticoagulant positive 07/02/2017  ? Personal history of pulmonary embolism 07/02/2017  ? Other forms of systemic lupus erythematosus ([place]) 03/12/2017  ? Anemia 02/28/2017  ? Migraine 02/28/2017  ? Dysmenorrhea 08/28/2011  ? Gastritis 08/28/2011  ? ? ?Past Surgical History:  ?Procedure Laterality Date  ? APPENDECTOMY    ? CHOLECYSTECTOMY    ? ESOPHAGOGASTRODUODENOSCOPY (EGD) WITH PROPOFOL N/A 06/18/2020  ? Procedure: ESOPHAGOGASTRODUODENOSCOPY (EGD) WITH PROPOFOL;  Surgeon: Doran Stabler, MD;  Location: [facility];  Service: Gastroenterology;  Laterality: N/A;  ? EXCISION OF ENDOMETRIOMA    ? Ovarian cyst removal  ? excision of endometriosis    ? FOOT FRACTURE SURGERY    ? w/ hardware  ? HERNIA REPAIR    ? TONSILLECTOMY    ? ? ?OB History   ?No obstetric history on file. ?  ? ? ? ?Home Medications   ? ?Prior to Admission medications   ?Medication Sig Start Date End Date Taking? Authorizing Provider  ?promethazine-dextromethorphan (PROMETHAZINE-DM) 6.25-15 MG/5ML syrup Take 5 mLs by mouth 4 (four) times daily as needed for cough. 04/16/21  Yes Teodora Medici, FNP  ?apixaban (ELIQUIS) 5 MG TABS tablet Take 5 mg by mouth 2 (two) times daily.    [provider]  ?EPINEPHrine 0.3 mg/0.3 mL IJ SOAJ injection Inject 0.3 mg into the muscle daily as needed (Allergic Reaction). 06/01/20   Lindell Spar I, NP  ?hydroxychloroquine (PLAQUENIL) 200 MG tablet Take 2 tablets (400 mg total) by mouth daily. ?Patient  taking differently: Take 400 mg by mouth at bedtime. 03/23/20   Collier Salina, MD  ?norethindrone (AYGESTIN) 5 MG tablet Take 1 tablet (5 mg total) by mouth at bedtime. 06/18/20   Carollee Leitz, MD  ?oxyCODONE (ROXICODONE) 5 MG immediate release tablet Take 1 tablet (5 mg total) by mouth every 8 (eight) hours as needed for up to 10 doses for severe pain. 06/29/20   Lennice Sites, DO  ?promethazine (PHENERGAN) 25 MG suppository Place 1 suppository (25 mg total) rectally every 6 (six) hours as needed for nausea  or vomiting. 06/29/20   Tacy Learn, PA-C  ?promethazine (PHENERGAN) 25 MG tablet Take 1 tablet (25 mg total) by mouth every 6 (six) hours as needed for nausea or vomiting. 06/29/20   Curatolo, Adam, DO  ?sertraline (ZOLOFT) 100 MG tablet Take 2 tablets (200 mg total) by mouth at bedtime. For depression 06/01/20   Lindell Spar I, NP  ?zolpidem (AMBIEN) 10 MG tablet Take 20 mg by mouth at bedtime. 06/02/20   [provider]  ?dicyclomine (BENTYL) 20 MG tablet Take 1 tablet (20 mg total) by mouth 2 (two) times daily. 11/29/18 04/11/19  Muthersbaugh, Jarrett Soho, PA-C  ?Fluticasone Propionate HFA (FLOVENT HFA IN) Inhale into the lungs. ?Patient not taking: Reported on 11/14/2019  11/14/19  [provider]  ? ? ?Family History ?Family History  ?Problem Relation Age of Onset  ? Hypertension Mother   ? Hypercholesterolemia Mother   ? Rheum arthritis Mother   ? Diabetes Father   ? Hypertension Father   ? Cancer Father   ? Autism Brother   ? ? ?Social History ?Social History  ? ?Tobacco Use  ? Smoking status: Never  ? Smokeless tobacco: Never  ?Vaping Use  ? Vaping Use: Never used  ?Substance Use Topics  ? Alcohol use: Never  ? Drug use: Never  ? ? ? ?Allergies   ?Azithromycin, Peanut-containing drug products, Iodinated contrast media, Other, Amoxicillin, Fish allergy, Gluten meal, Hydromet [hydrocodone bit-homatrop mbr], Ibuprofen, Lactose intolerance (gi), Morphine and related, and Pantoprazole ? ? ?Review of Systems ?Review of Systems ?Per HPI ? ?Physical Exam ?Triage Vital Signs ?ED Triage Vitals  ?Enc Vitals Group  ?   BP 04/16/21 1808 (!) 148/134  ?   Pulse Rate 04/16/21 1808 (!) 118  ?   Resp 04/16/21 1808 20  ?   Temp 04/16/21 1808 99 ?F (37.2 ?C)  ?   Temp Source 04/16/21 1808 Oral  ?   SpO2 04/16/21 1808 100 %  ?   Weight --   ?   Height --   ?   Head Circumference --   ?   Peak Flow --   ?   Pain Score 04/16/21 1829 0  ?   Pain Loc --   ?   Pain Edu? --   ?   Excl. in [place]? --   ? ?No data  found. ? ?Updated Vital Signs ?BP (!) 131/111 (BP Location: Right Arm)   Pulse (!) 118   Temp 99 ?F (37.2 ?C) (Oral)   Resp 20   SpO2 100%  ? ?Visual Acuity ?Right Eye Distance:   ?Left Eye Distance:   ?Bilateral Distance:   ? ?Right Eye Near:   ?Left Eye Near:    ?Bilateral Near:    ? ?Physical Exam ?Constitutional:   ?   General: She is not in acute distress. ?   Appearance: Normal appearance. She is not toxic-appearing or diaphoretic.  ?HENT:  ?  Head: Normocephalic and atraumatic.  ?   Right Ear: Tympanic membrane and ear canal normal.  ?   Left Ear: Tympanic membrane and ear canal normal.  ?   Nose: Congestion present.  ?   Mouth/Throat:  ?   Mouth: Mucous membranes are moist.  ?   Pharynx: No posterior oropharyngeal erythema.  ?Eyes:  ?   Extraocular Movements: Extraocular movements intact.  ?   Conjunctiva/sclera: Conjunctivae normal.  ?   Pupils: Pupils are equal, round, and reactive to light.  ?Cardiovascular:  ?   Rate and Rhythm: Normal rate and regular rhythm.  ?   Pulses: Normal pulses.  ?   Heart sounds: Normal heart sounds.  ?Pulmonary:  ?   Effort: Pulmonary effort is normal. No respiratory distress.  ?   Breath sounds: Normal breath sounds. No stridor. No wheezing, rhonchi or rales.  ?Abdominal:  ?   General: Abdomen is flat. Bowel sounds are normal.  ?   Palpations: Abdomen is soft.  ?Musculoskeletal:     ?   General: Normal range of motion.  ?   Cervical back: Normal range of motion.  ?Skin: ?   General: Skin is warm and dry.  ?Neurological:  ?   General: No focal deficit present.  ?   Mental Status: She is alert and oriented to person, place, and time. Mental status is at baseline.  ?Psychiatric:     ?   Mood and Affect: Mood normal.     ?   Behavior: Behavior normal.  ? ? ? ?UC Treatments / Results  ?Labs ?(all labs ordered are listed, but only abnormal results are displayed) ?Labs Reviewed  ?SARS CORONAVIRUS 2 (TAT 6-24 HRS)  ? ? ?EKG ? ? ?Radiology ?No results  found. ? ?Procedures ?Procedures (including critical care time) ? ?Medications Ordered in UC ?Medications - No data to display ? ?Initial Impression / Assessment and Plan / UC Course  ?I have reviewed the triage vital signs and the nursing notes. ? ?Perti

## 2021-04-16 NOTE — Discharge Instructions (Signed)
It appears that you have a viral upper respiratory infection that should run its course and self resolve in the next few days.  You have been prescribed a medication to help alleviate cough.  Please be advised that cough medication can cause drowsiness. ?

## 2021-04-17 ENCOUNTER — Emergency Department (HOSPITAL_COMMUNITY)
Admission: EM | Admit: 2021-04-17 | Discharge: 2021-04-17 | Discharge status: Against Medical Advice | Payer: PPO | Attending: Emergency Medicine | Admitting: Emergency Medicine

## 2021-04-17 ENCOUNTER — Other Ambulatory Visit (HOSPITAL_BASED_OUTPATIENT_CLINIC_OR_DEPARTMENT_OTHER): Payer: Self-pay

## 2021-04-17 ENCOUNTER — Encounter (HOSPITAL_BASED_OUTPATIENT_CLINIC_OR_DEPARTMENT_OTHER): Payer: Self-pay | Admitting: *Deleted

## 2021-04-17 ENCOUNTER — Other Ambulatory Visit: Payer: Self-pay

## 2021-04-17 ENCOUNTER — Emergency Department (HOSPITAL_COMMUNITY): Payer: PPO

## 2021-04-17 ENCOUNTER — Emergency Department (HOSPITAL_BASED_OUTPATIENT_CLINIC_OR_DEPARTMENT_OTHER)
Admission: EM | Admit: 2021-04-17 | Discharge: 2021-04-17 | Disposition: A | Discharge status: Home / Self Care | Payer: PPO | Source: Home / Self Care | Attending: Emergency Medicine | Admitting: Emergency Medicine

## 2021-04-17 ENCOUNTER — Encounter (HOSPITAL_COMMUNITY): Payer: Self-pay

## 2021-04-17 DIAGNOSIS — Z20822 Contact with and (suspected) exposure to covid-19: Secondary | ICD-10-CM | POA: Insufficient documentation

## 2021-04-17 DIAGNOSIS — R079 Chest pain, unspecified: Secondary | ICD-10-CM | POA: Insufficient documentation

## 2021-04-17 DIAGNOSIS — Z5321 Procedure and treatment not carried out due to patient leaving prior to being seen by health care provider: Secondary | ICD-10-CM | POA: Diagnosis not present

## 2021-04-17 DIAGNOSIS — Z91010 Allergy to peanuts: Secondary | ICD-10-CM | POA: Insufficient documentation

## 2021-04-17 DIAGNOSIS — R109 Unspecified abdominal pain: Secondary | ICD-10-CM | POA: Diagnosis not present

## 2021-04-17 DIAGNOSIS — Z7901 Long term (current) use of anticoagulants: Secondary | ICD-10-CM | POA: Insufficient documentation

## 2021-04-17 DIAGNOSIS — B349 Viral infection, unspecified: Secondary | ICD-10-CM | POA: Insufficient documentation

## 2021-04-17 DIAGNOSIS — R112 Nausea with vomiting, unspecified: Secondary | ICD-10-CM | POA: Diagnosis not present

## 2021-04-17 LAB — CBC
HCT: 40.2 % (ref 36.0–46.0)
Hemoglobin: 13.2 g/dL (ref 12.0–15.0)
MCH: 24.7 pg — ABNORMAL LOW (ref 26.0–34.0)
MCHC: 32.8 g/dL (ref 30.0–36.0)
MCV: 75.1 fL — ABNORMAL LOW (ref 80.0–100.0)
Platelets: 379 10*3/uL (ref 150–400)
RBC: 5.35 MIL/uL — ABNORMAL HIGH (ref 3.87–5.11)
RDW: 16.9 % — ABNORMAL HIGH (ref 11.5–15.5)
WBC: 10 10*3/uL (ref 4.0–10.5)
nRBC: 0 % (ref 0.0–0.2)

## 2021-04-17 LAB — BASIC METABOLIC PANEL
Anion gap: 9 (ref 5–15)
BUN: 11 mg/dL (ref 6–20)
CO2: 22 mmol/L (ref 22–32)
Calcium: 9.8 mg/dL (ref 8.9–10.3)
Chloride: 108 mmol/L (ref 98–111)
Creatinine, Ser: 0.7 mg/dL (ref 0.44–1.00)
GFR, Estimated: >60 mL/min (ref >60)
Glucose, Bld: 105 mg/dL — ABNORMAL HIGH (ref 70–99)
Potassium: 3.5 mmol/L (ref 3.5–5.1)
Sodium: 139 mmol/L (ref 135–145)

## 2021-04-17 LAB — I-STAT BETA HCG BLOOD, ED (MC, WL, AP ONLY): I-stat hCG, quantitative: <5 m[IU]/mL (ref <5)

## 2021-04-17 LAB — RESP PANEL BY RT-PCR (FLU A&B, COVID) ARPGX2
Influenza A by PCR: NEGATIVE
Influenza B by PCR: NEGATIVE
SARS Coronavirus 2 by RT PCR: NEGATIVE

## 2021-04-17 LAB — HEPATIC FUNCTION PANEL
ALT: 8 U/L (ref 0–44)
AST: 9 U/L — ABNORMAL LOW (ref 15–41)
Albumin: 4.5 g/dL (ref 3.5–5.0)
Alkaline Phosphatase: 58 U/L (ref 38–126)
Bilirubin, Direct: <0.1 mg/dL (ref 0.0–0.2)
Total Bilirubin: 0.2 mg/dL — ABNORMAL LOW (ref 0.3–1.2)
Total Protein: 7.4 g/dL (ref 6.5–8.1)

## 2021-04-17 LAB — D-DIMER, QUANTITATIVE: D-Dimer, Quant: 0.41 ug/mL-FEU (ref 0.00–0.50)

## 2021-04-17 LAB — LIPASE, BLOOD: Lipase: 43 U/L (ref 11–51)

## 2021-04-17 LAB — TROPONIN I (HIGH SENSITIVITY): Troponin I (High Sensitivity): 4 ng/L (ref <18)

## 2021-04-17 MED ORDER — HYDROCODONE-ACETAMINOPHEN 7.5-325 MG/15ML PO SOLN
15.0000 mL | Freq: Four times a day (QID) | ORAL | 0 refills | Status: DC | PRN
  Filled 2021-04-17: qty 120, 2d supply, fill #0

## 2021-04-17 MED ORDER — LACTATED RINGERS IV BOLUS
1000.0000 mL | Freq: Once | INTRAVENOUS | Status: AC
  Administered 2021-04-17: 1000 mL via INTRAVENOUS

## 2021-04-17 MED ORDER — DIPHENHYDRAMINE HCL 50 MG/ML IJ SOLN
25.0000 mg | Freq: Once | INTRAMUSCULAR | Status: AC
  Administered 2021-04-17: 25 mg via INTRAVENOUS
  Filled 2021-04-17: qty 1

## 2021-04-17 MED ORDER — PREDNISONE 20 MG PO TABS
40.0000 mg | ORAL_TABLET | Freq: Every day | ORAL | 0 refills | Status: DC
  Filled 2021-04-17: qty 10, 5d supply, fill #0

## 2021-04-17 MED ORDER — FENTANYL CITRATE PF 50 MCG/ML IJ SOSY
50.0000 ug | PREFILLED_SYRINGE | Freq: Once | INTRAMUSCULAR | Status: AC
  Administered 2021-04-17: 50 ug via INTRAVENOUS
  Filled 2021-04-17: qty 1

## 2021-04-17 MED ORDER — ONDANSETRON HCL 4 MG/2ML IJ SOLN
4.0000 mg | Freq: Once | INTRAMUSCULAR | Status: AC
  Administered 2021-04-17: 4 mg via INTRAVENOUS
  Filled 2021-04-17: qty 2

## 2021-04-17 NOTE — ED Triage Notes (Signed)
Patient arrived with complaints of chest and central abdominal pain since yesterday. Reports  NV ?

## 2021-04-17 NOTE — ED Triage Notes (Signed)
Pt reporting she has had a cough, seen at [facility] for the same. Now having some chest pain and abdominal pain associated with nausea and vomiting. Hx of PE (prescribed Eliqus, but has not taken in about a month) and lupus.  ?

## 2021-04-17 NOTE — ED Provider Notes (Signed)
I assumed care of the patient at 7 AM from Dr. Karle Starch.  Patient here with URI symptoms, ongoing cough and chest pain.  Patient recently seen at [facility] long and had lab work done but left before evaluation.  D-dimer was negative and low suspicion for PE.  Patient's labs today which I independently interpreted showed normal LFTs, normal respiratory panel and EKG without acute findings.  On reevaluation patient's vital signs are improved and she is feeling better.  Will treat with cough medicine and steroids as patient does have a history of asthma and has persistent coughing.  Sats are 100% on room air.  Findings were discussed with the patient.  She is comfortable with this plan. ?  Blanchie Dessert, MD ?04/17/21 [phone] ? ?

## 2021-04-17 NOTE — ED Provider Notes (Signed)
? ?[facility]  ?Provider Note ? ?CSN: [id number] ?Arrival date & time: 04/17/21 7741 ? ?History ?No chief complaint on file. ? ? ?[name] is a 26 y.o. female with history of lupus, prior PE, no longer on anticoagulation and endometriosis reports several days of productive cough, chest soreness and now abdominal pain, nausea and vomitign. Intermittent fevers. Was seen at [facility] yesterday, given Rx for cough syrup, Covid test was sent but not resulted yet. She was feeling worse around midnight and went to [facility] where labs were done but she was not seen. Left due to long wait times and came here for evaluation.  ? ? ?Home Medications ?Prior to Admission medications   ?Medication Sig Start Date End Date Taking? Authorizing Provider  ?apixaban (ELIQUIS) 5 MG TABS tablet Take 5 mg by mouth 2 (two) times daily.   Yes [provider]  ?EPINEPHrine 0.3 mg/0.3 mL IJ SOAJ injection Inject 0.3 mg into the muscle daily as needed (Allergic Reaction). 06/01/20   Lindell Spar I, NP  ?hydroxychloroquine (PLAQUENIL) 200 MG tablet Take 2 tablets (400 mg total) by mouth daily. ?Patient taking differently: Take 400 mg by mouth at bedtime. 03/23/20   Collier Salina, MD  ?norethindrone (AYGESTIN) 5 MG tablet Take 1 tablet (5 mg total) by mouth at bedtime. 06/18/20   Carollee Leitz, MD  ?oxyCODONE (ROXICODONE) 5 MG immediate release tablet Take 1 tablet (5 mg total) by mouth every 8 (eight) hours as needed for up to 10 doses for severe pain. 06/29/20   Lennice Sites, DO  ?promethazine (PHENERGAN) 25 MG suppository Place 1 suppository (25 mg total) rectally every 6 (six) hours as needed for nausea or vomiting. 06/29/20   Tacy Learn, PA-C  ?promethazine (PHENERGAN) 25 MG tablet Take 1 tablet (25 mg total) by mouth every 6 (six) hours as needed for nausea or vomiting. 06/29/20   Lennice Sites, DO  ?promethazine-dextromethorphan (PROMETHAZINE-DM) 6.25-15 MG/5ML syrup Take 5 mLs by mouth 4 (four) times  daily as needed for cough. 04/16/21   Teodora Medici, FNP  ?sertraline (ZOLOFT) 100 MG tablet Take 2 tablets (200 mg total) by mouth at bedtime. For depression 06/01/20   Lindell Spar I, NP  ?zolpidem (AMBIEN) 10 MG tablet Take 20 mg by mouth at bedtime. 06/02/20   [provider]  ?dicyclomine (BENTYL) 20 MG tablet Take 1 tablet (20 mg total) by mouth 2 (two) times daily. 11/29/18 04/11/19  Muthersbaugh, Jarrett Soho, PA-C  ?Fluticasone Propionate HFA (FLOVENT HFA IN) Inhale into the lungs. ?Patient not taking: Reported on 11/14/2019  11/14/19  [provider]  ? ? ? ?Allergies    ?Azithromycin, Peanut-containing drug products, Iodinated contrast media, Other, Amoxicillin, Fish allergy, Gluten meal, Hydromet [hydrocodone bit-homatrop mbr], Ibuprofen, Lactose intolerance (gi), Morphine and related, and Pantoprazole ? ? ?Review of Systems   ?Review of Systems ?Please see HPI for pertinent positives and negatives ? ?Physical Exam ?BP (!) 144/85 (BP Location: Right Arm)   Pulse (!) 119   Temp 98.6 ?F (37 ?C) (Oral)   Resp 13   Ht 5' 2"  (1.575 m)   Wt 104.3 kg   SpO2 100%   BMI 42.07 kg/m?  ? ?Physical Exam ?Vitals and nursing note reviewed.  ?Constitutional:   ?   Appearance: Normal appearance.  ?HENT:  ?   Head: Normocephalic and atraumatic.  ?   Nose: Nose normal.  ?   Mouth/Throat:  ?   Mouth: Mucous membranes are moist.  ?Eyes:  ?  Extraocular Movements: Extraocular movements intact.  ?   Conjunctiva/sclera: Conjunctivae normal.  ?Cardiovascular:  ?   Rate and Rhythm: Normal rate.  ?Pulmonary:  ?   Effort: Pulmonary effort is normal.  ?   Breath sounds: Normal breath sounds.  ?Abdominal:  ?   General: Abdomen is flat.  ?   Palpations: Abdomen is soft.  ?   Tenderness: There is abdominal tenderness (diffuse). There is no guarding.  ?Musculoskeletal:     ?   General: No swelling. Normal range of motion.  ?   Cervical back: Neck supple.  ?Skin: ?   General: Skin is warm and dry.  ?Neurological:  ?    General: No focal deficit present.  ?   Mental Status: She is alert.  ?Psychiatric:     ?   Mood and Affect: Mood normal.  ? ? ?ED Results / Procedures / Treatments   ?EKG ?EKG Interpretation ? ?Date/Time:  Monday April 17 2021 06:27:19 EDT ?Ventricular Rate:  120 ?PR Interval:  144 ?QRS Duration: 94 ?QT Interval:  339 ?QTC Calculation: 479 ?R Axis:   92 ?Text Interpretation: Sinus tachycardia Borderline right axis deviation Borderline T abnormalities, inferior leads Borderline prolonged QT interval No significant change since last tracing EARLIER SAME DATE Confirmed by Calvert Cantor [phone]) on 04/17/2021 6:37:24 AM ? ?Procedures ?Procedures ? ?Medications Ordered in the ED ?Medications  ?lactated ringers bolus 1,000 mL (has no administration in time range)  ?fentaNYL (SUBLIMAZE) injection 50 mcg (has no administration in time range)  ?ondansetron (ZOFRAN) injection 4 mg (has no administration in time range)  ? ? ?Initial Impression and Plan ? Patient with multiple medical problems here for symptoms consistent with viral syndrome. She had labs done at [facility] earlier tonight: CBC was unremarkable, BMP normal. Lipase normal, dimer was neg. Trop was neg. I personally viewed the images from radiology studies and agree with radiologist interpretation: CXR was clear. Will add LFTs given abdominal pain. Give pain,nausea meds and IVF for comfort. No concern for PE given normal dimer just a few hours ago. Care will be signed out to the oncoming team at shift change.  ? ?ED Course  ? ?  ? ? ?MDM Rules/Calculators/A&P ?Medical Decision Making ?Problems Addressed: ?Viral syndrome: acute illness or injury ? ?Amount and/or Complexity of Data Reviewed ?Labs: ordered. ? ?Risk ?Prescription drug management. ?Parenteral controlled substances. ? ? ? ?Final Clinical Impression(s) / ED Diagnoses ?Final diagnoses:  ?Viral syndrome  ? ? ?Rx / DC Orders ?ED Discharge Orders   ? ? None  ? ?  ? ?  ?Truddie Hidden, MD ?04/17/21 [phone] ? ?

## 2021-04-17 NOTE — Discharge Instructions (Addendum)
All your imaging from earlier in your labs here look good.  This is most likely just a bad cold and may be flaring her asthma a little bit.  Use your inhaler as needed.  You are also given cough medicine and steroids.  You also may improve with honey. ?

## 2021-04-18 LAB — SARS CORONAVIRUS 2 (TAT 6-24 HRS): SARS Coronavirus 2: NEGATIVE

## 2021-04-23 ENCOUNTER — Encounter (HOSPITAL_BASED_OUTPATIENT_CLINIC_OR_DEPARTMENT_OTHER): Payer: Self-pay

## 2021-04-23 ENCOUNTER — Emergency Department (HOSPITAL_BASED_OUTPATIENT_CLINIC_OR_DEPARTMENT_OTHER)
Admission: EM | Admit: 2021-04-23 | Discharge: 2021-04-23 | Disposition: A | Discharge status: Home / Self Care | Payer: PPO | Attending: Emergency Medicine | Admitting: Emergency Medicine

## 2021-04-23 ENCOUNTER — Other Ambulatory Visit: Payer: Self-pay

## 2021-04-23 DIAGNOSIS — N809 Endometriosis, unspecified: Secondary | ICD-10-CM | POA: Insufficient documentation

## 2021-04-23 DIAGNOSIS — Z91010 Allergy to peanuts: Secondary | ICD-10-CM | POA: Diagnosis not present

## 2021-04-23 DIAGNOSIS — D72829 Elevated white blood cell count, unspecified: Secondary | ICD-10-CM | POA: Insufficient documentation

## 2021-04-23 DIAGNOSIS — R1084 Generalized abdominal pain: Secondary | ICD-10-CM

## 2021-04-23 DIAGNOSIS — R109 Unspecified abdominal pain: Secondary | ICD-10-CM | POA: Diagnosis present

## 2021-04-23 LAB — COMPREHENSIVE METABOLIC PANEL
ALT: 10 U/L (ref 0–44)
AST: 11 U/L — ABNORMAL LOW (ref 15–41)
Albumin: 4.7 g/dL (ref 3.5–5.0)
Alkaline Phosphatase: 76 U/L (ref 38–126)
Anion gap: 12 (ref 5–15)
BUN: 9 mg/dL (ref 6–20)
CO2: 20 mmol/L — ABNORMAL LOW (ref 22–32)
Calcium: 10.2 mg/dL (ref 8.9–10.3)
Chloride: 108 mmol/L (ref 98–111)
Creatinine, Ser: 0.75 mg/dL (ref 0.44–1.00)
GFR, Estimated: >60 mL/min (ref >60)
Glucose, Bld: 145 mg/dL — ABNORMAL HIGH (ref 70–99)
Potassium: 3.4 mmol/L — ABNORMAL LOW (ref 3.5–5.1)
Sodium: 140 mmol/L (ref 135–145)
Total Bilirubin: 0.3 mg/dL (ref 0.3–1.2)
Total Protein: 7.8 g/dL (ref 6.5–8.1)

## 2021-04-23 LAB — URINALYSIS, ROUTINE W REFLEX MICROSCOPIC
Bilirubin Urine: NEGATIVE
Glucose, UA: NEGATIVE mg/dL
Ketones, ur: NEGATIVE mg/dL
Leukocytes,Ua: NEGATIVE
Nitrite: NEGATIVE
Protein, ur: 30 mg/dL — AB
Specific Gravity, Urine: 1.04 — ABNORMAL HIGH (ref 1.005–1.030)
pH: 5.5 (ref 5.0–8.0)

## 2021-04-23 LAB — CBC
HCT: 39.2 % (ref 36.0–46.0)
Hemoglobin: 12.7 g/dL (ref 12.0–15.0)
MCH: 24 pg — ABNORMAL LOW (ref 26.0–34.0)
MCHC: 32.4 g/dL (ref 30.0–36.0)
MCV: 74 fL — ABNORMAL LOW (ref 80.0–100.0)
Platelets: 430 10*3/uL — ABNORMAL HIGH (ref 150–400)
RBC: 5.3 MIL/uL — ABNORMAL HIGH (ref 3.87–5.11)
RDW: 17 % — ABNORMAL HIGH (ref 11.5–15.5)
WBC: 14.9 10*3/uL — ABNORMAL HIGH (ref 4.0–10.5)
nRBC: 0 % (ref 0.0–0.2)

## 2021-04-23 LAB — PREGNANCY, URINE: Preg Test, Ur: NEGATIVE

## 2021-04-23 LAB — LIPASE, BLOOD: Lipase: 34 U/L (ref 11–51)

## 2021-04-23 MED ORDER — KETOROLAC TROMETHAMINE 30 MG/ML IJ SOLN
15.0000 mg | Freq: Once | INTRAMUSCULAR | Status: AC
Start: 2021-04-23 — End: 2021-04-23
  Administered 2021-04-23: 15 mg via INTRAVENOUS
  Filled 2021-04-23: qty 1

## 2021-04-23 MED ORDER — DIPHENHYDRAMINE HCL 50 MG/ML IJ SOLN
25.0000 mg | Freq: Once | INTRAMUSCULAR | Status: AC
  Administered 2021-04-23: 25 mg via INTRAVENOUS
  Filled 2021-04-23: qty 1

## 2021-04-23 MED ORDER — OXYCODONE-ACETAMINOPHEN 5-325 MG PO TABS
1.0000 | ORAL_TABLET | ORAL | 0 refills | Status: DC | PRN
Start: 2021-04-23 — End: 2021-06-03

## 2021-04-23 MED ORDER — ONDANSETRON HCL 4 MG/2ML IJ SOLN
4.0000 mg | Freq: Once | INTRAMUSCULAR | Status: AC
  Administered 2021-04-23: 4 mg via INTRAVENOUS
  Filled 2021-04-23: qty 2

## 2021-04-23 MED ORDER — SODIUM CHLORIDE 0.9 % IV BOLUS
1000.0000 mL | Freq: Once | INTRAVENOUS | Status: AC
  Administered 2021-04-23: 1000 mL via INTRAVENOUS

## 2021-04-23 MED ORDER — HYDROMORPHONE HCL 1 MG/ML IJ SOLN
1.0000 mg | Freq: Once | INTRAMUSCULAR | Status: AC
  Administered 2021-04-23: 1 mg via INTRAVENOUS
  Filled 2021-04-23: qty 1

## 2021-04-23 NOTE — ED Notes (Signed)
EDP at bedside  

## 2021-04-23 NOTE — ED Triage Notes (Signed)
Complaining of pain in the abdomen that started today at 1:30. Hx of endometriosis, hurts to ambulate. ?

## 2021-04-23 NOTE — ED Provider Notes (Signed)
?[facility] ?Provider Note ? ? ?CSN: [id number] ?Arrival date & time: 04/23/21  0217 ? ?  ? ?History ? ?Chief Complaint  ?Patient presents with  ? Abdominal Pain  ? ? ?[name] is a 26 y.o. female. ? ?Patient presents to the emergency department for evaluation of abdominal pain.  Patient reports that she has been having diffuse, severe abdominal pain with nausea and vomiting.  Patient reports a history of recurrent symptoms similar to this secondary to her endometriosis.  She is in school here in [facility], normally lives in [place].  She has an OB/GYN in [place] that she follows with.  Patient reports intermittent flares similar to this in the past that require analgesia alone.  Patient thinks that this flare was initiated when she accidentally missed a day of her OCP. ? ? ?  ? ?Home Medications ?Prior to Admission medications   ?Medication Sig Start Date End Date Taking? Authorizing Provider  ?apixaban (ELIQUIS) 5 MG TABS tablet Take 5 mg by mouth 2 (two) times daily.    [provider]  ?EPINEPHrine 0.3 mg/0.3 mL IJ SOAJ injection Inject 0.3 mg into the muscle daily as needed (Allergic Reaction). 06/01/20   Lindell Spar I, NP  ?HYDROcodone-acetaminophen (HYCET) 7.5-325 mg/15 ml solution Take 15 mLs by mouth 4 (four) times daily as needed for moderate pain. 04/17/21 04/17/22  Blanchie Dessert, MD  ?hydroxychloroquine (PLAQUENIL) 200 MG tablet Take 2 tablets (400 mg total) by mouth daily. ?Patient taking differently: Take 400 mg by mouth at bedtime. 03/23/20   Collier Salina, MD  ?norethindrone (AYGESTIN) 5 MG tablet Take 1 tablet (5 mg total) by mouth at bedtime. 06/18/20   Carollee Leitz, MD  ?oxyCODONE (ROXICODONE) 5 MG immediate release tablet Take 1 tablet (5 mg total) by mouth every 8 (eight) hours as needed for up to 10 doses for severe pain. 06/29/20   Curatolo, Adam, DO  ?predniSONE (DELTASONE) 20 MG tablet Take 2 tablets (40 mg total) by mouth daily. 04/17/21    Blanchie Dessert, MD  ?promethazine (PHENERGAN) 25 MG suppository Place 1 suppository (25 mg total) rectally every 6 (six) hours as needed for nausea or vomiting. 06/29/20   Tacy Learn, PA-C  ?promethazine (PHENERGAN) 25 MG tablet Take 1 tablet (25 mg total) by mouth every 6 (six) hours as needed for nausea or vomiting. 06/29/20   Lennice Sites, DO  ?promethazine-dextromethorphan (PROMETHAZINE-DM) 6.25-15 MG/5ML syrup Take 5 mLs by mouth 4 (four) times daily as needed for cough. 04/16/21   Teodora Medici, FNP  ?sertraline (ZOLOFT) 100 MG tablet Take 2 tablets (200 mg total) by mouth at bedtime. For depression 06/01/20   Lindell Spar I, NP  ?zolpidem (AMBIEN) 10 MG tablet Take 20 mg by mouth at bedtime. 06/02/20   [provider]  ?dicyclomine (BENTYL) 20 MG tablet Take 1 tablet (20 mg total) by mouth 2 (two) times daily. 11/29/18 04/11/19  Muthersbaugh, Jarrett Soho, PA-C  ?Fluticasone Propionate HFA (FLOVENT HFA IN) Inhale into the lungs. ?Patient not taking: Reported on 11/14/2019  11/14/19  [provider]  ?   ? ?Allergies    ?Azithromycin, Peanut-containing drug products, Iodinated contrast media, Other, Amoxicillin, Fish allergy, Gluten meal, Hydromet [hydrocodone bit-homatrop mbr], Ibuprofen, Lactose intolerance (gi), Morphine and related, and Pantoprazole   ? ?Review of Systems   ?Review of Systems  ?Gastrointestinal:  Positive for abdominal pain.  ? ?Physical Exam ?Updated Vital Signs ?BP (!) 158/83 (BP Location: Right Arm)   Pulse Marland Kitchen)  105   Temp 98.4 ?F (36.9 ?C) (Oral)   Resp 18   Ht 5' 2"  (1.575 m)   Wt 104.3 kg   SpO2 99%   BMI 42.07 kg/m?  ?Physical Exam ?Vitals and nursing note reviewed.  ?Constitutional:   ?   General: She is not in acute distress. ?   Appearance: She is well-developed.  ?HENT:  ?   Head: Normocephalic and atraumatic.  ?   Mouth/Throat:  ?   Mouth: Mucous membranes are moist.  ?Eyes:  ?   General: Vision grossly intact. Gaze aligned appropriately.  ?   Extraocular  Movements: Extraocular movements intact.  ?   Conjunctiva/sclera: Conjunctivae normal.  ?Cardiovascular:  ?   Rate and Rhythm: Normal rate and regular rhythm.  ?   Pulses: Normal pulses.  ?   Heart sounds: Normal heart sounds, S1 normal and S2 normal. No murmur heard. ?  No friction rub. No gallop.  ?Pulmonary:  ?   Effort: Pulmonary effort is normal. No respiratory distress.  ?   Breath sounds: Normal breath sounds.  ?Abdominal:  ?   General: Bowel sounds are normal.  ?   Palpations: Abdomen is soft.  ?   Tenderness: There is generalized abdominal tenderness. There is no guarding or rebound.  ?   Hernia: No hernia is present.  ?Musculoskeletal:     ?   General: No swelling.  ?   Cervical back: Full passive range of motion without pain, normal range of motion and neck supple. No spinous process tenderness or muscular tenderness. Normal range of motion.  ?   Right lower leg: No edema.  ?   Left lower leg: No edema.  ?Skin: ?   General: Skin is warm and dry.  ?   Capillary Refill: Capillary refill takes less than 2 seconds.  ?   Findings: No ecchymosis, erythema, rash or wound.  ?Neurological:  ?   General: No focal deficit present.  ?   Mental Status: She is alert and oriented to person, place, and time.  ?   GCS: GCS eye subscore is 4. GCS verbal subscore is 5. GCS motor subscore is 6.  ?   Cranial Nerves: Cranial nerves 2-12 are intact.  ?   Sensory: Sensation is intact.  ?   Motor: Motor function is intact.  ?   Coordination: Coordination is intact.  ?Psychiatric:     ?   Attention and Perception: Attention normal.     ?   Mood and Affect: Mood normal.     ?   Speech: Speech normal.     ?   Behavior: Behavior normal.  ? ? ?ED Results / Procedures / Treatments   ?Labs ?(all labs ordered are listed, but only abnormal results are displayed) ?Labs Reviewed  ?COMPREHENSIVE METABOLIC PANEL - Abnormal; Notable for the following components:  ?    Result Value  ? Potassium 3.4 (*)   ? CO2 20 (*)   ? Glucose, Bld 145 (*)    ? AST 11 (*)   ? All other components within normal limits  ?CBC - Abnormal; Notable for the following components:  ? WBC 14.9 (*)   ? RBC 5.30 (*)   ? MCV 74.0 (*)   ? MCH 24.0 (*)   ? RDW 17.0 (*)   ? Platelets 430 (*)   ? All other components within normal limits  ?URINALYSIS, ROUTINE W REFLEX MICROSCOPIC - Abnormal; Notable for the following components:  ? APPearance  HAZY (*)   ? Specific Gravity, Urine 1.040 (*)   ? Hgb urine dipstick LARGE (*)   ? Protein, ur 30 (*)   ? Bacteria, UA RARE (*)   ? All other components within normal limits  ?LIPASE, BLOOD  ?PREGNANCY, URINE  ? ? ?EKG ?None ? ?Radiology ?No results found. ? ?Procedures ?Procedures  ? ? ?Medications Ordered in ED ?Medications  ?sodium chloride 0.9 % bolus 1,000 mL (1,000 mLs Intravenous New Bag/Given 04/23/21 0557)  ?ondansetron [facility]) injection 4 mg (4 mg Intravenous Given 04/23/21 0558)  ?HYDROmorphone (DILAUDID) injection 1 mg (1 mg Intravenous Given 04/23/21 0558)  ?ketorolac (TORADOL) 30 MG/ML injection 15 mg (15 mg Intravenous Given 04/23/21 0557)  ? ? ?ED Course/ Medical Decision Making/ A&P ?  ?                        ?Medical Decision Making ?Amount and/or Complexity of Data Reviewed ?Labs: ordered. ? ?Risk ?Prescription drug management. ? ? ?Patient presents to the emergency department for evaluation of abdominal pain.  Patient experiencing diffuse, nonfocal abdominal pain that she has had in the past, associated with her endometriosis.  Patient reports missing a dose of her oral contraceptive pill which often facilitates a flare of her endometriosis.  Patient appears well.  Blood pressure is slightly elevated, heart rate is not.  Patient reports that this is consistent with what occurs with her endometriosis flares.  She is afebrile. ? ?Lab work reveals leukocytosis, otherwise no significant abnormality.  Nonfocal abdominal exam, does not require imaging at this time.  Will treat with fluids, analgesia to ask. ? ? ? ? ? ? ? ?Final Clinical  Impression(s) / ED Diagnoses ?Final diagnoses:  ?Generalized abdominal pain  ?Endometriosis  ? ? ?Rx / DC Orders ?ED Discharge Orders   ? ? None  ? ?  ? ? ?  ?Orpah Greek, MD ?04/23/21 [phone] ? ?

## 2021-04-23 NOTE — ED Notes (Signed)
Pt reports itching without SOB or swelling after dilaudid. EDP provides orders for benadryl, see mar ?

## 2021-04-26 ENCOUNTER — Emergency Department (HOSPITAL_COMMUNITY)
Admission: EM | Admit: 2021-04-26 | Discharge: 2021-04-26 | Disposition: A | Discharge status: Home / Self Care | Payer: PPO | Attending: Emergency Medicine | Admitting: Emergency Medicine

## 2021-04-26 ENCOUNTER — Encounter (HOSPITAL_COMMUNITY): Payer: Self-pay

## 2021-04-26 ENCOUNTER — Emergency Department (HOSPITAL_COMMUNITY): Payer: PPO

## 2021-04-26 DIAGNOSIS — Z91010 Allergy to peanuts: Secondary | ICD-10-CM | POA: Insufficient documentation

## 2021-04-26 DIAGNOSIS — R1031 Right lower quadrant pain: Secondary | ICD-10-CM | POA: Diagnosis present

## 2021-04-26 DIAGNOSIS — R55 Syncope and collapse: Secondary | ICD-10-CM | POA: Diagnosis not present

## 2021-04-26 DIAGNOSIS — Z7901 Long term (current) use of anticoagulants: Secondary | ICD-10-CM | POA: Insufficient documentation

## 2021-04-26 DIAGNOSIS — R000 Tachycardia, unspecified: Secondary | ICD-10-CM | POA: Insufficient documentation

## 2021-04-26 DIAGNOSIS — N390 Urinary tract infection, site not specified: Secondary | ICD-10-CM | POA: Diagnosis not present

## 2021-04-26 LAB — COMPREHENSIVE METABOLIC PANEL
ALT: 11 U/L (ref 0–44)
AST: 17 U/L (ref 15–41)
Albumin: 3.8 g/dL (ref 3.5–5.0)
Alkaline Phosphatase: 77 U/L (ref 38–126)
Anion gap: 9 (ref 5–15)
BUN: 12 mg/dL (ref 6–20)
CO2: 24 mmol/L (ref 22–32)
Calcium: 8.9 mg/dL (ref 8.9–10.3)
Chloride: 107 mmol/L (ref 98–111)
Creatinine, Ser: 0.88 mg/dL (ref 0.44–1.00)
GFR, Estimated: >60 mL/min (ref >60)
Glucose, Bld: 114 mg/dL — ABNORMAL HIGH (ref 70–99)
Potassium: 3.8 mmol/L (ref 3.5–5.1)
Sodium: 140 mmol/L (ref 135–145)
Total Bilirubin: 0.4 mg/dL (ref 0.3–1.2)
Total Protein: 6.9 g/dL (ref 6.5–8.1)

## 2021-04-26 LAB — URINALYSIS, ROUTINE W REFLEX MICROSCOPIC
Bilirubin Urine: NEGATIVE
Glucose, UA: NEGATIVE mg/dL
Ketones, ur: NEGATIVE mg/dL
Leukocytes,Ua: NEGATIVE
Nitrite: NEGATIVE
Protein, ur: NEGATIVE mg/dL
Specific Gravity, Urine: 1.018 (ref 1.005–1.030)
pH: 6 (ref 5.0–8.0)

## 2021-04-26 LAB — CBC
HCT: 39.8 % (ref 36.0–46.0)
Hemoglobin: 13 g/dL (ref 12.0–15.0)
MCH: 24.3 pg — ABNORMAL LOW (ref 26.0–34.0)
MCHC: 32.7 g/dL (ref 30.0–36.0)
MCV: 74.3 fL — ABNORMAL LOW (ref 80.0–100.0)
Platelets: 479 10*3/uL — ABNORMAL HIGH (ref 150–400)
RBC: 5.36 MIL/uL — ABNORMAL HIGH (ref 3.87–5.11)
RDW: 16.6 % — ABNORMAL HIGH (ref 11.5–15.5)
WBC: 13.3 10*3/uL — ABNORMAL HIGH (ref 4.0–10.5)
nRBC: 0 % (ref 0.0–0.2)

## 2021-04-26 LAB — LIPASE, BLOOD: Lipase: 31 U/L (ref 11–51)

## 2021-04-26 LAB — I-STAT BETA HCG BLOOD, ED (MC, WL, AP ONLY): I-stat hCG, quantitative: <5 m[IU]/mL (ref <5)

## 2021-04-26 LAB — CBG MONITORING, ED: Glucose-Capillary: 118 mg/dL — ABNORMAL HIGH (ref 70–99)

## 2021-04-26 MED ORDER — SODIUM CHLORIDE 0.9 % IV BOLUS
1000.0000 mL | Freq: Once | INTRAVENOUS | Status: AC
  Administered 2021-04-26: 1000 mL via INTRAVENOUS

## 2021-04-26 MED ORDER — DIPHENHYDRAMINE HCL 25 MG PO CAPS
25.0000 mg | ORAL_CAPSULE | Freq: Once | ORAL | Status: AC
  Administered 2021-04-26: 25 mg via ORAL
  Filled 2021-04-26: qty 1

## 2021-04-26 MED ORDER — HYDROMORPHONE HCL 1 MG/ML IJ SOLN
1.0000 mg | Freq: Once | INTRAMUSCULAR | Status: AC
  Administered 2021-04-26: 1 mg via INTRAVENOUS
  Filled 2021-04-26: qty 1

## 2021-04-26 MED ORDER — CEFTRIAXONE SODIUM 1 G IJ SOLR
1.0000 g | Freq: Once | INTRAMUSCULAR | Status: AC
  Administered 2021-04-26: 1 g via INTRAVENOUS
  Filled 2021-04-26: qty 10

## 2021-04-26 MED ORDER — CEFDINIR 300 MG PO CAPS: 300.0000 mg | ORAL_CAPSULE | Freq: Two times a day (BID) | ORAL | 0 refills | Status: DC

## 2021-04-26 MED ORDER — ONDANSETRON HCL 4 MG/2ML IJ SOLN
4.0000 mg | Freq: Once | INTRAMUSCULAR | Status: AC
  Administered 2021-04-26: 4 mg via INTRAVENOUS
  Filled 2021-04-26: qty 2

## 2021-04-26 NOTE — ED Provider Notes (Signed)
?[name] ?Provider Note ? ? ?CSN: [id number] ?Arrival date & time: 04/26/21  0031 ? ?  ? ?History ? ?Chief Complaint  ?Patient presents with  ? Loss of Consciousness  ? Abdominal Pain  ? ? ?[name] is a 26 y.o. female. ? ?The history is provided by the patient and medical records.  ?Loss of Consciousness ?Abdominal Pain ?[name] is a 26 y.o. female who presents to the Emergency Department complaining of abdominal pain.  She presents to the emergency department complaining of 1 week of central and right lower quadrant abdominal pain.  She has a history of endometriosis and recurrent abdominal pain and thought her pain was secondary to that.  Last night she started with vomiting, 4-5 episodes.  No diarrhea or dysuria.  She states that she passed out at home due to pain.  No head injury.  Her pain radiates to her back and is worse with walking. ? ? 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  Prior surgical history includes cholecystectomy, appendectomy. ? ?No vaginal discharge.  Not sexually active. ?Home Medications ?Prior to Admission medications   ?Medication Sig Start Date End Date Taking? Authorizing Provider  ?cefdinir (OMNICEF) 300 MG capsule Take 1 capsule (300 mg total) by mouth 2 (two) times daily. 04/26/21  Yes Quintella Reichert, MD  ?apixaban (ELIQUIS) 5 MG TABS tablet Take 5 mg by mouth 2 (two) times daily.    [provider]  ?EPINEPHrine 0.3 mg/0.3 mL IJ SOAJ injection Inject 0.3 mg into the muscle daily as needed (Allergic Reaction). 06/01/20   Lindell Spar I, NP  ?HYDROcodone-acetaminophen (HYCET) 7.5-325 mg/15 ml solution Take 15 mLs by mouth 4 (four) times daily as needed for moderate pain. 04/17/21 04/17/22  Blanchie Dessert, MD  ?hydroxychloroquine (PLAQUENIL) 200 MG tablet Take 2 tablets (400 mg total) by mouth daily. ?Patient taking differently: Take 400 mg by mouth at bedtime. 03/23/20    Collier Salina, MD  ?norethindrone (AYGESTIN) 5 MG tablet Take 1 tablet (5 mg total) by mouth at bedtime. 06/18/20   Carollee Leitz, MD  ?oxyCODONE-acetaminophen (PERCOCET) 5-325 MG tablet Take 1-2 tablets by mouth every 4 (four) hours as needed. 04/23/21   Orpah Greek, MD  ?predniSONE (DELTASONE) 20 MG tablet Take 2 tablets (40 mg total) by mouth daily. 04/17/21   Blanchie Dessert, MD  ?promethazine (PHENERGAN) 25 MG suppository Place 1 suppository (25 mg total) rectally every 6 (six) hours as needed for nausea or vomiting. 06/29/20   Tacy Learn, PA-C  ?promethazine (PHENERGAN) 25 MG tablet Take 1 tablet (25 mg total) by mouth every 6 (six) hours as needed for nausea or vomiting. 06/29/20   Lennice Sites, DO  ?promethazine-dextromethorphan (PROMETHAZINE-DM) 6.25-15 MG/5ML syrup Take 5 mLs by mouth 4 (four) times daily as needed for cough. 04/16/21   Teodora Medici, FNP  ?sertraline (ZOLOFT) 100 MG tablet Take 2 tablets (200 mg total) by mouth at bedtime. For depression 06/01/20   Lindell Spar I, NP  ?zolpidem (AMBIEN) 10 MG tablet Take 20 mg by mouth at bedtime. 06/02/20   [provider]  ?dicyclomine (BENTYL) 20 MG tablet Take 1 tablet (20 mg total) by mouth 2 (two) times daily. 11/29/18 04/11/19  Muthersbaugh, Jarrett Soho, PA-C  ?Fluticasone Propionate HFA (FLOVENT HFA IN) Inhale into the lungs. ?Patient not taking: Reported on 11/14/2019  11/14/19  [provider]  ?   ? ?Allergies    ?Azithromycin, Peanut-containing  drug products, Iodinated contrast media, Other, Amoxicillin, Fish allergy, Gluten meal, Hydromet [hydrocodone bit-homatrop mbr], Ibuprofen, Lactose intolerance (gi), Morphine and related, and Pantoprazole   ? ?Review of Systems   ?Review of Systems  ?Cardiovascular:  Positive for syncope.  ?Gastrointestinal:  Positive for abdominal pain.  ?All other systems reviewed and are negative. ? ?Physical Exam ?Updated Vital Signs ?BP (!) 148/112   Pulse (!) 114   Temp 99.1 ?F (37.3  ?C) (Oral)   Resp (!) 26   SpO2 96%  ?Physical Exam ?Vitals and nursing note reviewed.  ?Constitutional:   ?   Appearance: She is well-developed.  ?HENT:  ?   Head: Normocephalic and atraumatic.  ?Cardiovascular:  ?   Rate and Rhythm: Regular rhythm. Tachycardia present.  ?   Heart sounds: No murmur heard. ?Pulmonary:  ?   Effort: Pulmonary effort is normal. No respiratory distress.  ?   Breath sounds: Normal breath sounds.  ?Abdominal:  ?   Palpations: Abdomen is soft.  ?   Tenderness: There is no guarding or rebound.  ?   Comments: Mild lower abdominal tenderness  ?Musculoskeletal:     ?   General: No tenderness.  ?Skin: ?   General: Skin is warm and dry.  ?Neurological:  ?   Mental Status: She is alert and oriented to person, place, and time.  ?Psychiatric:     ?   Behavior: Behavior normal.  ? ? ?ED Results / Procedures / Treatments   ?Labs ?(all labs ordered are listed, but only abnormal results are displayed) ?Labs Reviewed  ?CBC - Abnormal; Notable for the following components:  ?    Result Value  ? WBC 13.3 (*)   ? RBC 5.36 (*)   ? MCV 74.3 (*)   ? MCH 24.3 (*)   ? RDW 16.6 (*)   ? Platelets 479 (*)   ? All other components within normal limits  ?URINALYSIS, ROUTINE W REFLEX MICROSCOPIC - Abnormal; Notable for the following components:  ? APPearance CLOUDY (*)   ? Hgb urine dipstick SMALL (*)   ? Bacteria, UA MANY (*)   ? All other components within normal limits  ?COMPREHENSIVE METABOLIC PANEL - Abnormal; Notable for the following components:  ? Glucose, Bld 114 (*)   ? All other components within normal limits  ?CBG MONITORING, ED - Abnormal; Notable for the following components:  ? Glucose-Capillary 118 (*)   ? All other components within normal limits  ?URINE CULTURE  ?LIPASE, BLOOD  ?I-STAT BETA HCG BLOOD, ED ([facility], [facility], [facility] ONLY)  ? ? ?EKG ?EKG Interpretation ? ?Date/Time:  Wednesday April 26 2021 01:03:46 EDT ?Ventricular Rate:  124 ?PR Interval:  132 ?QRS Duration: 101 ?QT Interval:  332 ?QTC  Calculation: 477 ?R Axis:   65 ?Text Interpretation: Sinus tachycardia Probable left atrial enlargement Borderline prolonged QT interval Confirmed by Quintella Reichert [phone]) on 04/26/2021 2:50:08 AM ? ?Radiology ?CT Abdomen Pelvis Wo Contrast ? ?Result Date: 04/26/2021 ?CLINICAL DATA:  Right lower quadrant pain. Prior history of cholecystectomy and appendectomy. EXAM: CT ABDOMEN AND PELVIS WITHOUT CONTRAST TECHNIQUE: Multidetector CT imaging of the abdomen and pelvis was performed following the standard protocol without IV contrast. RADIATION DOSE REDUCTION: This exam was performed according to the departmental dose-optimization program which includes automated exposure control, adjustment of the mA and/or kV according to patient size and/or use of iterative reconstruction technique. COMPARISON:  CTs without contrast 07/04/2020 and 01/02/2020 FINDINGS: Lower chest: No acute abnormality. Hepatobiliary: 20 cm length liver  without significant visible steatosis. There is a 1 cm cyst in the right lobe anterior segment with no other focal abnormality visible without contrast. The gallbladder is absent with no biliary dilatation. Pancreas: Unremarkable without contrast. Spleen: Unremarkable without contrast, normal in size. Adrenals/Urinary Tract: No adrenal or renal cortical abnormality is seen without contrast. There are no urinary stones or obstruction. The bladder does appear thickened but is not fully distended. Stomach/Bowel: The unopacified stomach and small bowel are unremarkable. The appendix surgically absent, as before. Moderate fecal retention is again noted, scattered diverticulosis without evidence of colitis or diverticulitis. Vascular/Lymphatic: No significant vascular findings are present. No enlarged abdominal or pelvic lymph nodes. There are scattered pelvic phleboliths. Reproductive: The uterus is intact.  The ovaries are normal in size. Other: Small umbilical fat hernia. No incarcerated hernia. No free  air, hemorrhage or fluid. Musculoskeletal: No acute or significant osseous findings. IMPRESSION: 1. Cystitis versus bladder nondistention. 2. 1 cm stable cyst in the right lobe of the liver. 3. Moderate stool retention wit

## 2021-04-26 NOTE — ED Notes (Signed)
Only able to obtain cbc during lab draw ?

## 2021-04-26 NOTE — ED Triage Notes (Signed)
Patient arrived with complaints of central abdominal pain, NV, and a syncopal episode today at 3pm.  ?

## 2021-04-27 ENCOUNTER — Ambulatory Visit: Payer: PPO | Admitting: Family Medicine

## 2021-04-27 LAB — URINE CULTURE

## 2021-05-18 ENCOUNTER — Encounter (HOSPITAL_COMMUNITY): Payer: Self-pay

## 2021-05-18 ENCOUNTER — Emergency Department (HOSPITAL_COMMUNITY)
Admission: EM | Admit: 2021-05-18 | Discharge: 2021-05-18 | Disposition: A | Discharge status: Home / Self Care | Payer: PPO | Attending: Emergency Medicine | Admitting: Emergency Medicine

## 2021-05-18 ENCOUNTER — Other Ambulatory Visit: Payer: Self-pay

## 2021-05-18 DIAGNOSIS — R1032 Left lower quadrant pain: Secondary | ICD-10-CM | POA: Insufficient documentation

## 2021-05-18 DIAGNOSIS — G8929 Other chronic pain: Secondary | ICD-10-CM | POA: Diagnosis not present

## 2021-05-18 DIAGNOSIS — R102 Pelvic and perineal pain: Secondary | ICD-10-CM | POA: Insufficient documentation

## 2021-05-18 DIAGNOSIS — D72829 Elevated white blood cell count, unspecified: Secondary | ICD-10-CM | POA: Insufficient documentation

## 2021-05-18 DIAGNOSIS — Z7901 Long term (current) use of anticoagulants: Secondary | ICD-10-CM | POA: Insufficient documentation

## 2021-05-18 DIAGNOSIS — R300 Dysuria: Secondary | ICD-10-CM | POA: Diagnosis not present

## 2021-05-18 DIAGNOSIS — R1031 Right lower quadrant pain: Secondary | ICD-10-CM | POA: Diagnosis not present

## 2021-05-18 DIAGNOSIS — Z91010 Allergy to peanuts: Secondary | ICD-10-CM | POA: Insufficient documentation

## 2021-05-18 DIAGNOSIS — R112 Nausea with vomiting, unspecified: Secondary | ICD-10-CM | POA: Insufficient documentation

## 2021-05-18 LAB — COMPREHENSIVE METABOLIC PANEL
ALT: 13 U/L (ref 0–44)
AST: 19 U/L (ref 15–41)
Albumin: 4.2 g/dL (ref 3.5–5.0)
Alkaline Phosphatase: 83 U/L (ref 38–126)
Anion gap: 9 (ref 5–15)
BUN: 8 mg/dL (ref 6–20)
CO2: 17 mmol/L — ABNORMAL LOW (ref 22–32)
Calcium: 9.2 mg/dL (ref 8.9–10.3)
Chloride: 110 mmol/L (ref 98–111)
Creatinine, Ser: 0.61 mg/dL (ref 0.44–1.00)
GFR, Estimated: >60 mL/min (ref >60)
Glucose, Bld: 94 mg/dL (ref 70–99)
Potassium: 3.8 mmol/L (ref 3.5–5.1)
Sodium: 136 mmol/L (ref 135–145)
Total Bilirubin: 0.3 mg/dL (ref 0.3–1.2)
Total Protein: 7.4 g/dL (ref 6.5–8.1)

## 2021-05-18 LAB — URINALYSIS, ROUTINE W REFLEX MICROSCOPIC
Bilirubin Urine: NEGATIVE
Glucose, UA: NEGATIVE mg/dL
Ketones, ur: NEGATIVE mg/dL
Leukocytes,Ua: NEGATIVE
Nitrite: NEGATIVE
Protein, ur: NEGATIVE mg/dL
Specific Gravity, Urine: 1.014 (ref 1.005–1.030)
pH: 5 (ref 5.0–8.0)

## 2021-05-18 LAB — CBC WITH DIFFERENTIAL/PLATELET
Abs Immature Granulocytes: 0.01 10*3/uL (ref 0.00–0.07)
Basophils Absolute: 0 10*3/uL (ref 0.0–0.1)
Basophils Relative: 0 %
Eosinophils Absolute: 0.3 10*3/uL (ref 0.0–0.5)
Eosinophils Relative: 3 %
HCT: 39.4 % (ref 36.0–46.0)
Hemoglobin: 12.9 g/dL (ref 12.0–15.0)
Immature Granulocytes: 0 %
Lymphocytes Relative: 34 %
Lymphs Abs: 3.6 10*3/uL (ref 0.7–4.0)
MCH: 24.8 pg — ABNORMAL LOW (ref 26.0–34.0)
MCHC: 32.7 g/dL (ref 30.0–36.0)
MCV: 75.6 fL — ABNORMAL LOW (ref 80.0–100.0)
Monocytes Absolute: 0.6 10*3/uL (ref 0.1–1.0)
Monocytes Relative: 5 %
Neutro Abs: 6 10*3/uL (ref 1.7–7.7)
Neutrophils Relative %: 58 %
Platelets: 462 10*3/uL — ABNORMAL HIGH (ref 150–400)
RBC: 5.21 MIL/uL — ABNORMAL HIGH (ref 3.87–5.11)
RDW: 17.9 % — ABNORMAL HIGH (ref 11.5–15.5)
WBC: 10.6 10*3/uL — ABNORMAL HIGH (ref 4.0–10.5)
nRBC: 0 % (ref 0.0–0.2)

## 2021-05-18 LAB — POC URINE PREG, ED: Preg Test, Ur: NEGATIVE

## 2021-05-18 LAB — LIPASE, BLOOD: Lipase: 37 U/L (ref 11–51)

## 2021-05-18 LAB — LACTIC ACID, PLASMA: Lactic Acid, Venous: 1.9 mmol/L (ref 0.5–1.9)

## 2021-05-18 MED ORDER — HYDROMORPHONE HCL 1 MG/ML IJ SOLN
1.0000 mg | Freq: Once | INTRAMUSCULAR | Status: AC
  Administered 2021-05-18: 1 mg via INTRAVENOUS
  Filled 2021-05-18: qty 1

## 2021-05-18 MED ORDER — HYDROMORPHONE HCL 1 MG/ML IJ SOLN
0.5000 mg | Freq: Once | INTRAMUSCULAR | Status: AC
  Administered 2021-05-18: 0.5 mg via INTRAVENOUS
  Filled 2021-05-18: qty 1

## 2021-05-18 MED ORDER — ONDANSETRON HCL 4 MG/2ML IJ SOLN
4.0000 mg | Freq: Once | INTRAMUSCULAR | Status: AC
  Administered 2021-05-18: 4 mg via INTRAVENOUS
  Filled 2021-05-18: qty 2

## 2021-05-18 MED ORDER — LACTATED RINGERS IV BOLUS
1000.0000 mL | Freq: Once | INTRAVENOUS | Status: AC
  Administered 2021-05-18: 1000 mL via INTRAVENOUS

## 2021-05-18 MED ORDER — KETOROLAC TROMETHAMINE 15 MG/ML IJ SOLN
15.0000 mg | Freq: Once | INTRAMUSCULAR | Status: AC
  Administered 2021-05-18: 15 mg via INTRAVENOUS
  Filled 2021-05-18: qty 1

## 2021-05-18 MED ORDER — PROCHLORPERAZINE EDISYLATE 10 MG/2ML IJ SOLN
10.0000 mg | Freq: Once | INTRAMUSCULAR | Status: AC
  Administered 2021-05-18: 10 mg via INTRAVENOUS
  Filled 2021-05-18: qty 2

## 2021-05-18 MED ORDER — DIPHENHYDRAMINE HCL 50 MG/ML IJ SOLN
25.0000 mg | Freq: Once | INTRAMUSCULAR | Status: AC
  Administered 2021-05-18: 25 mg via INTRAVENOUS
  Filled 2021-05-18: qty 1

## 2021-05-18 NOTE — ED Provider Notes (Signed)
?[facility] DEPT ?Provider Note ? ? ?CSN: [id number] ?Arrival date & time: 05/18/21  0134 ? ?  ? ?History ? ?Chief Complaint  ?Patient presents with  ? Abdominal Pain  ? ? ?[name] is a 26 y.o. female with a past medical history of endometriosis, chronic pelvic pain, lupus, PE anticoagulated with Eliquis, celiac disease, depression, who presents today for evaluation of ongoing pelvic pain. ?She was seen earlier this month where she had a UA showing multiple species with recommendation of recollection. ?She feels like she has not been getting any better since. ?She previously had a biopsy at a outside facility of a urologic mass however did not get follow-up for this. ?She states that she has been having fevers with temps up to 100. ?Patient states that she is not sexually active, has not been in over 8 years, denies any vaginal discharge. ?Chart review shows that she requires general anesthesia for any GYN evaluation. ? ?HPI ? ?  ? ?Home Medications ?Prior to Admission medications   ?Medication Sig Start Date End Date Taking? Authorizing Provider  ?apixaban (ELIQUIS) 5 MG TABS tablet Take 5 mg by mouth 2 (two) times daily.   Yes [provider]  ?norethindrone (AYGESTIN) 5 MG tablet Take 1 tablet (5 mg total) by mouth at bedtime. 06/18/20  Yes Carollee Leitz, MD  ?promethazine-dextromethorphan (PROMETHAZINE-DM) 6.25-15 MG/5ML syrup Take 5 mLs by mouth 4 (four) times daily as needed for cough. 04/16/21  Yes Teodora Medici, FNP  ?ALPRAZolam (XANAX) 1 MG tablet Take 1.5 mg by mouth 2 (two) times daily. ?Patient not taking: Reported on 05/18/2021 02/13/21   [provider]  ?EPINEPHrine 0.3 mg/0.3 mL IJ SOAJ injection Inject 0.3 mg into the muscle daily as needed (Allergic Reaction). ?Patient not taking: Reported on 05/18/2021 06/01/20   Lindell Spar I, NP  ?HYDROcodone-acetaminophen (HYCET) 7.5-325 mg/15 ml solution Take 15 mLs by mouth 4 (four) times daily as needed for  moderate pain. ?Patient not taking: Reported on 05/18/2021 04/17/21 04/17/22  Blanchie Dessert, MD  ?hydroxychloroquine (PLAQUENIL) 200 MG tablet Take 2 tablets (400 mg total) by mouth daily. ?Patient not taking: Reported on 05/18/2021 03/23/20   Collier Salina, MD  ?oxyCODONE-acetaminophen (PERCOCET) 5-325 MG tablet Take 1-2 tablets by mouth every 4 (four) hours as needed. ?Patient not taking: Reported on 05/18/2021 04/23/21   Orpah Greek, MD  ?zolpidem (AMBIEN) 10 MG tablet Take 20 mg by mouth at bedtime. ?Patient not taking: Reported on 05/18/2021 06/02/20   [provider]  ?dicyclomine (BENTYL) 20 MG tablet Take 1 tablet (20 mg total) by mouth 2 (two) times daily. 11/29/18 04/11/19  Muthersbaugh, Jarrett Soho, PA-C  ?Fluticasone Propionate HFA (FLOVENT HFA IN) Inhale into the lungs. ?Patient not taking: Reported on 11/14/2019  11/14/19  [provider]  ?   ? ?Allergies    ?Azithromycin, Peanut-containing drug products, Iodinated contrast media, Other, Amoxicillin, Fish allergy, Gluten meal, Hydromet [hydrocodone bit-homatrop mbr], Ibuprofen, Lactose intolerance (gi), Morphine and related, and Pantoprazole   ? ?Review of Systems   ?Review of Systems ? ?Physical Exam ?Updated Vital Signs ?BP (!) 136/105   Pulse (!) 101   Temp 98.2 ?F (36.8 ?C) (Oral)   Resp 14   Ht 5' 2"  (1.575 m)   Wt 102.1 kg   SpO2 95%   BMI 41.15 kg/m?  ?Physical Exam ?Vitals and nursing note reviewed.  ?Constitutional:   ?   General: She is not in acute distress. ?   Appearance:  She is obese. She is not diaphoretic.  ?HENT:  ?   Head: Normocephalic and atraumatic.  ?Eyes:  ?   General: No scleral icterus.    ?   Right eye: No discharge.     ?   Left eye: No discharge.  ?   Conjunctiva/sclera: Conjunctivae normal.  ?Cardiovascular:  ?   Rate and Rhythm: Normal rate and regular rhythm.  ?Pulmonary:  ?   Effort: Pulmonary effort is normal. No respiratory distress.  ?   Breath sounds: No stridor.  ?Abdominal:  ?    General: There is no distension.  ?   Palpations: Abdomen is soft.  ?   Tenderness: There is abdominal tenderness in the right lower quadrant, suprapubic area and left lower quadrant.  ?Musculoskeletal:     ?   General: No deformity.  ?   Cervical back: Normal range of motion.  ?Skin: ?   General: Skin is warm and dry.  ?Neurological:  ?   General: No focal deficit present.  ?   Mental Status: She is alert.  ?   Cranial Nerves: No cranial nerve deficit.  ?   Motor: No abnormal muscle tone.  ?Psychiatric:     ?   Mood and Affect: Mood normal.     ?   Behavior: Behavior normal.  ? ? ?ED Results / Procedures / Treatments   ?Labs ?(all labs ordered are listed, but only abnormal results are displayed) ?Labs Reviewed  ?COMPREHENSIVE METABOLIC PANEL - Abnormal; Notable for the following components:  ?    Result Value  ? CO2 17 (*)   ? All other components within normal limits  ?CBC WITH DIFFERENTIAL/PLATELET - Abnormal; Notable for the following components:  ? WBC 10.6 (*)   ? RBC 5.21 (*)   ? MCV 75.6 (*)   ? MCH 24.8 (*)   ? RDW 17.9 (*)   ? Platelets 462 (*)   ? All other components within normal limits  ?CULTURE, BLOOD (ROUTINE X 2)  ?CULTURE, BLOOD (ROUTINE X 2)  ?URINE CULTURE  ?LACTIC ACID, PLASMA  ?LIPASE, BLOOD  ?URINALYSIS, ROUTINE W REFLEX MICROSCOPIC  ?CBC WITH DIFFERENTIAL/PLATELET  ?LACTIC ACID, PLASMA  ?POC URINE PREG, ED  ? ? ?EKG ?None ? ?Radiology ?No results found. ? ?Procedures ?Procedures  ? ? ?Medications Ordered in ED ?Medications  ?lactated ringers bolus 1,000 mL (1,000 mLs Intravenous New Bag/Given 05/18/21 2993)  ?ondansetron [facility]) injection 4 mg (4 mg Intravenous Given 05/18/21 7169)  ?diphenhydrAMINE (BENADRYL) injection 25 mg (25 mg Intravenous Given 05/18/21 6789)  ?HYDROmorphone (DILAUDID) injection 0.5 mg (0.5 mg Intravenous Given 05/18/21 0636)  ?ketorolac (TORADOL) 15 MG/ML injection 15 mg (15 mg Intravenous Given 05/18/21 0635)  ? ? ?ED Course/ Medical Decision Making/ A&P ?Clinical Course  as of 05/18/21 0657  ?Thu May 18, 2021  ?3810 Lipase: 37 ?Not significantly elevated [EH]  ?1751 Postvoid residual bladder scan was 0 mL [EH]  ?0650 WBC(!): 10.6 ?Improved from 3 weeks ago [EH]  ?0258 Lactic Acid, Venous: 1.9 [EH]  ?0653 Comprehensive metabolic panel(!) ?No significant abnormalities [EH]  ?  ?Clinical Course User Index ?[EH] Lorin Glass, PA-C  ? ?                        ?Medical Decision Making ?Patient is a 26 year old woman who presents today for evaluation of chronic pelvic pain.  She was seen earlier this month for a possible UTI.  Her symptoms did not  change with antibiotics. ?She had a urine culture showing multiple species with recommendation of right collection. ? ?Labs are obtained and reviewed, please see below. ?Outside imaging was reviewed from multiple healthcare systems. ? ?With her reports of fevers blood cultures and urine cultures are ordered. ? ?At the time of shift change plan is to follow-up on UA, response to treatments including antiemetics, pain medications and fluids and p.o. challenge. ? ?At shift change care was transferred to [facility] who will follow pending studies, re-evaulate and determine disposition.   ? ? ? ?Amount and/or Complexity of Data Reviewed ?External Data Reviewed: labs, radiology and notes. ?   Details: Multiple outside sources including [facility] ?Labs: ordered. Decision-making details documented in ED Course. ?Radiology:  ?   Details: Radiology is considered, between here and on review of outside records patient has had multiple CT scans recently.  Given that her symptoms have not significantly changed she and I discussed role of additional imaging and through shared decision making we will forego at this time. ?Ultrasound is considered, however patient has previously required general anesthesia to undergo any GYN procedure and attempts at obtaining transabdominal pelvic imaging have not been successful due to body  habitus and therefore I do not think that ultrasound would be practical or useful in the ER setting. ? ?Risk ?Prescription drug management. ? ? ? ? ? ? ? ? ?Social determinants of health affecting patient's care includ

## 2021-05-18 NOTE — ED Triage Notes (Signed)
Pt states that she had a bad UTI a month ago that isnt resolved. Pt continues to have abdominal pain and does not feel well. Pt reports that her BP has been elevated due to pain.  ?

## 2021-05-18 NOTE — Discharge Instructions (Addendum)
It was a pleasure taking care of you today!  ? ?Your urine didn't show an UTI today. Your labs were overall unremarkable. You will be sent a prescription for Zofran to take as directed for nausea or vomiting. Follow up with your care team as needed. Return to the ED if you are experiencing increasing/worsening pain, fever, inability to keep fluids down, vaginal bleeding/discharge, or worsening symptoms. ?

## 2021-05-18 NOTE — ED Provider Notes (Signed)
Patient care transferred from Wyn Quaker, PA-C at time of sign out. See their note for full assessment.  ? ?Briefly: Patient is 26 y.o. female with history of chronic pelvic pain, lupus, who presents to the ED with ongoing pelvic pain. Pt is anticoagulanted due to history of PE. She was seen for an UTI on 04/26/21 and treated at that time. Urine cultures were inconclusive.   ? ?Plan: Plan per previous PA-C: pending pain medication, IVF, pending urinalysis, and reassessment. ? ?Clinical Course as of 05/18/21 1537  ?Thu May 18, 2021  ?1007 Lipase: 37 ?Not significantly elevated [EH]  ?1219 Postvoid residual bladder scan was 0 mL [EH]  ?0650 WBC(!): 10.6 ?Improved from 3 weeks ago [EH]  ?7588 Lactic Acid, Venous: 1.9 [EH]  ?0653 Comprehensive metabolic panel(!) ?No significant abnormalities [EH]  ?3254 Pt evaluated and discussed with patient treatment plan consistent of pending urinalysis and pain management.  Answered all available questions. [SB]  ?0844 Re-evaluated and noted improvement of symptoms with treatment regimen.  Discussed discharge treatment plan with patient.  Patient appears safe for discharge. [SB]  ?  ?Clinical Course User Index ?[EH] Lorin Glass, PA-C ?[SB] Reneshia Zuccaro A, PA-C  ?  ? ? ?Doubt acute cystitis as cause of pain.  Likely patient's history of chronic pelvic pain due to endometriosis as cause of symptoms today.  Discussed in depth with the patient regarding chronic pain and the need to have her chronic pain managed by her primary care provider.  Patient's symptoms improved with treatment regimen in the ED.  Urinalysis negative for acute cystitis at this time.  Discussed with patient importance of maintaining follow-up with her care team regarding today's ED visit.  Supportive care and strict return precautions discussed with patient. Patient acknowledges and verbalizes understanding. Pt appears safe for discharge. Follow up as indicated in discharge paperwork.   ? ?This chart  was dictated using voice recognition software, Dragon. Despite the best efforts of this provider to proofread and correct errors, errors may still occur which can change documentation meaning. ?  ?Dahlia Nifong A, PA-C ?05/18/21 1537 ? ?  ?Lennice Sites, DO ?05/19/21 9826 ? ?

## 2021-05-19 LAB — URINE CULTURE

## 2021-05-20 ENCOUNTER — Encounter (HOSPITAL_BASED_OUTPATIENT_CLINIC_OR_DEPARTMENT_OTHER): Payer: Self-pay | Admitting: Emergency Medicine

## 2021-05-20 ENCOUNTER — Emergency Department (HOSPITAL_BASED_OUTPATIENT_CLINIC_OR_DEPARTMENT_OTHER)
Admission: EM | Admit: 2021-05-20 | Discharge: 2021-05-20 | Disposition: A | Discharge status: Home / Self Care | Payer: PPO | Attending: Emergency Medicine | Admitting: Emergency Medicine

## 2021-05-20 ENCOUNTER — Other Ambulatory Visit: Payer: Self-pay

## 2021-05-20 DIAGNOSIS — Z7901 Long term (current) use of anticoagulants: Secondary | ICD-10-CM | POA: Diagnosis not present

## 2021-05-20 DIAGNOSIS — R1110 Vomiting, unspecified: Secondary | ICD-10-CM | POA: Diagnosis not present

## 2021-05-20 DIAGNOSIS — R1030 Lower abdominal pain, unspecified: Secondary | ICD-10-CM

## 2021-05-20 DIAGNOSIS — Z91010 Allergy to peanuts: Secondary | ICD-10-CM | POA: Diagnosis not present

## 2021-05-20 LAB — COMPREHENSIVE METABOLIC PANEL
ALT: 11 U/L (ref 0–44)
AST: 14 U/L — ABNORMAL LOW (ref 15–41)
Albumin: 4.3 g/dL (ref 3.5–5.0)
Alkaline Phosphatase: 87 U/L (ref 38–126)
Anion gap: 9 (ref 5–15)
BUN: 7 mg/dL (ref 6–20)
CO2: 25 mmol/L (ref 22–32)
Calcium: 10 mg/dL (ref 8.9–10.3)
Chloride: 106 mmol/L (ref 98–111)
Creatinine, Ser: 0.76 mg/dL (ref 0.44–1.00)
GFR, Estimated: >60 mL/min (ref >60)
Glucose, Bld: 94 mg/dL (ref 70–99)
Potassium: 3.6 mmol/L (ref 3.5–5.1)
Sodium: 140 mmol/L (ref 135–145)
Total Bilirubin: 0.2 mg/dL — ABNORMAL LOW (ref 0.3–1.2)
Total Protein: 7.4 g/dL (ref 6.5–8.1)

## 2021-05-20 LAB — CBC WITH DIFFERENTIAL/PLATELET
Abs Immature Granulocytes: 0.01 10*3/uL (ref 0.00–0.07)
Basophils Absolute: 0 10*3/uL (ref 0.0–0.1)
Basophils Relative: 1 %
Eosinophils Absolute: 0.4 10*3/uL (ref 0.0–0.5)
Eosinophils Relative: 5 %
HCT: 37.3 % (ref 36.0–46.0)
Hemoglobin: 11.8 g/dL — ABNORMAL LOW (ref 12.0–15.0)
Immature Granulocytes: 0 %
Lymphocytes Relative: 40 %
Lymphs Abs: 3.2 10*3/uL (ref 0.7–4.0)
MCH: 23.7 pg — ABNORMAL LOW (ref 26.0–34.0)
MCHC: 31.6 g/dL (ref 30.0–36.0)
MCV: 75.1 fL — ABNORMAL LOW (ref 80.0–100.0)
Monocytes Absolute: 0.4 10*3/uL (ref 0.1–1.0)
Monocytes Relative: 5 %
Neutro Abs: 4 10*3/uL (ref 1.7–7.7)
Neutrophils Relative %: 49 %
Platelets: 438 10*3/uL — ABNORMAL HIGH (ref 150–400)
RBC: 4.97 MIL/uL (ref 3.87–5.11)
RDW: 17.9 % — ABNORMAL HIGH (ref 11.5–15.5)
WBC: 8 10*3/uL (ref 4.0–10.5)
nRBC: 0 % (ref 0.0–0.2)

## 2021-05-20 LAB — URINALYSIS, ROUTINE W REFLEX MICROSCOPIC
Bilirubin Urine: NEGATIVE
Glucose, UA: NEGATIVE mg/dL
Ketones, ur: NEGATIVE mg/dL
Nitrite: NEGATIVE
Protein, ur: 30 mg/dL — AB
RBC / HPF: >50 RBC/hpf — ABNORMAL HIGH (ref 0–5)
Specific Gravity, Urine: 1.029 (ref 1.005–1.030)
pH: 5 (ref 5.0–8.0)

## 2021-05-20 LAB — LIPASE, BLOOD: Lipase: 33 U/L (ref 11–51)

## 2021-05-20 LAB — PREGNANCY, URINE: Preg Test, Ur: NEGATIVE

## 2021-05-20 MED ORDER — SODIUM CHLORIDE 0.9 % IV BOLUS
1000.0000 mL | Freq: Once | INTRAVENOUS | Status: AC
  Administered 2021-05-20: 1000 mL via INTRAVENOUS

## 2021-05-20 MED ORDER — DIPHENHYDRAMINE HCL 50 MG/ML IJ SOLN
25.0000 mg | Freq: Once | INTRAMUSCULAR | Status: AC
  Administered 2021-05-20: 25 mg via INTRAVENOUS
  Filled 2021-05-20: qty 1

## 2021-05-20 MED ORDER — ONDANSETRON HCL 4 MG/2ML IJ SOLN
4.0000 mg | Freq: Once | INTRAMUSCULAR | Status: AC
Start: 2021-05-20 — End: 2021-05-20
  Administered 2021-05-20: 4 mg via INTRAVENOUS
  Filled 2021-05-20: qty 2

## 2021-05-20 MED ORDER — HYDROMORPHONE HCL 1 MG/ML IJ SOLN
1.0000 mg | Freq: Once | INTRAMUSCULAR | Status: AC
  Administered 2021-05-20: 1 mg via INTRAVENOUS
  Filled 2021-05-20 (×2): qty 1

## 2021-05-20 NOTE — ED Notes (Signed)
Dc instructions reviewed with pt no questions or concerns at this time. Will follow up with pcp and obgyn  ?

## 2021-05-20 NOTE — ED Notes (Signed)
IV attempt x 1 unsuccessful. ?

## 2021-05-20 NOTE — Discharge Instructions (Addendum)
Call your primary care doctor or specialist as discussed in the next 2-3 days.   Return immediately back to the ER if:  Your symptoms worsen within the next 12-24 hours. You develop new symptoms such as new fevers, persistent vomiting, new pain, shortness of breath, or new weakness or numbness, or if you have any other concerns.  

## 2021-05-20 NOTE — ED Provider Notes (Signed)
?[facility] EM[facility]ENC[facility] DEPT ?Provider Note ? ? ?CSN: [id number] ?Arrival date & time: 05/20/21  0753 ? ?  ? ?History ? ?Chief Complaint  ?Patient presents with  ? Emesis  ? ? ?[name] is a 26 y.o. female. ? ?Patient with a history of chronic and mid abdominal pain endometriosis, presents with recurrent abdominal pain.  She states it feels similar to her prior abdominal pains, described as aching sensation in the lower abdominal region.  Is been going on all month.  She was seen in outside hospital about 3 to 4 days ago but has been having continued pain that is intermittent throughout the last past month and presents to the ER.  She typically sees her OB/GYN doctor once a year in [place] and prefers to follow-up with that doctor in [place].  She is scheduled to see them again in 2 months.  She otherwise has been having episodes of vomiting nonbloody nonbilious.  No associated diarrhea.  No fevers no cough.  Taking Tylenol and Motrin at home without improvement. ? ? ?  ? ?Home Medications ?Prior to Admission medications   ?Medication Sig Start Date End Date Taking? Authorizing Provider  ?ALPRAZolam (XANAX) 1 MG tablet Take 1.5 mg by mouth 2 (two) times daily. ?Patient not taking: Reported on 05/18/2021 02/13/21   [provider]  ?apixaban (ELIQUIS) 5 MG TABS tablet Take 5 mg by mouth 2 (two) times daily.    [provider]  ?EPINEPHrine 0.3 mg/0.3 mL IJ SOAJ injection Inject 0.3 mg into the muscle daily as needed (Allergic Reaction). ?Patient not taking: Reported on 05/18/2021 06/01/20   Lindell Spar I, NP  ?HYDROcodone-acetaminophen (HYCET) 7.5-325 mg/15 ml solution Take 15 mLs by mouth 4 (four) times daily as needed for moderate pain. ?Patient not taking: Reported on 05/18/2021 04/17/21 04/17/22  Blanchie Dessert, MD  ?hydroxychloroquine (PLAQUENIL) 200 MG tablet Take 2 tablets (400 mg total) by mouth daily. ?Patient not taking: Reported on 05/18/2021 03/23/20   Collier Salina, MD   ?norethindrone (AYGESTIN) 5 MG tablet Take 1 tablet (5 mg total) by mouth at bedtime. 06/18/20   Carollee Leitz, MD  ?oxyCODONE-acetaminophen (PERCOCET) 5-325 MG tablet Take 1-2 tablets by mouth every 4 (four) hours as needed. ?Patient not taking: Reported on 05/18/2021 04/23/21   Orpah Greek, MD  ?promethazine-dextromethorphan (PROMETHAZINE-DM) 6.25-15 MG/5ML syrup Take 5 mLs by mouth 4 (four) times daily as needed for cough. 04/16/21   Teodora Medici, FNP  ?zolpidem (AMBIEN) 10 MG tablet Take 20 mg by mouth at bedtime. ?Patient not taking: Reported on 05/18/2021 06/02/20   [provider]  ?dicyclomine (BENTYL) 20 MG tablet Take 1 tablet (20 mg total) by mouth 2 (two) times daily. 11/29/18 04/11/19  Muthersbaugh, Jarrett Soho, PA-C  ?Fluticasone Propionate HFA (FLOVENT HFA IN) Inhale into the lungs. ?Patient not taking: Reported on 11/14/2019  11/14/19  [provider]  ?   ? ?Allergies    ?Azithromycin, Peanut-containing drug products, Iodinated contrast media, Other, Amoxicillin, Fish allergy, Gluten meal, Hydromet [hydrocodone bit-homatrop mbr], Ibuprofen, Lactose intolerance (gi), Morphine and related, and Pantoprazole   ? ?Review of Systems   ?Review of Systems  ?Constitutional:  Negative for fever.  ?HENT:  Negative for ear pain.   ?Eyes:  Negative for pain.  ?Respiratory:  Negative for cough.   ?Cardiovascular:  Negative for chest pain.  ?Gastrointestinal:  Positive for abdominal pain.  ?Genitourinary:  Negative for flank pain.  ?Musculoskeletal:  Negative for back pain.  ?Skin:  Negative  for rash.  ?Neurological:  Negative for headaches.  ? ?Physical Exam ?Updated Vital Signs ?BP (!) 142/105   Pulse (!) 103   Temp 98.8 ?F (37.1 ?C) (Oral)   Resp 12   Ht 5' 2"  (1.575 m)   Wt 104.3 kg   LMP  (LMP Unknown)   SpO2 99%   BMI 42.07 kg/m?  ?Physical Exam ?Constitutional:   ?   General: She is not in acute distress. ?   Appearance: Normal appearance.  ?HENT:  ?   Head: Normocephalic.  ?    Nose: Nose normal.  ?Eyes:  ?   Extraocular Movements: Extraocular movements intact.  ?Cardiovascular:  ?   Rate and Rhythm: Normal rate.  ?Pulmonary:  ?   Effort: Pulmonary effort is normal.  ?Abdominal:  ?   Comments: Mild to moderate diffuse lower abdominal tenderness.  No guarding or rebound noted.  ?Musculoskeletal:     ?   General: Normal range of motion.  ?   Cervical back: Normal range of motion.  ?Neurological:  ?   General: No focal deficit present.  ?   Mental Status: She is alert. Mental status is at baseline.  ? ? ?ED Results / Procedures / Treatments   ?Labs ?(all labs ordered are listed, but only abnormal results are displayed) ?Labs Reviewed  ?CBC WITH DIFFERENTIAL/PLATELET - Abnormal; Notable for the following components:  ?    Result Value  ? Hemoglobin 11.8 (*)   ? MCV 75.1 (*)   ? MCH 23.7 (*)   ? RDW 17.9 (*)   ? Platelets 438 (*)   ? All other components within normal limits  ?COMPREHENSIVE METABOLIC PANEL - Abnormal; Notable for the following components:  ? AST 14 (*)   ? Total Bilirubin 0.2 (*)   ? All other components within normal limits  ?URINALYSIS, ROUTINE W REFLEX MICROSCOPIC - Abnormal; Notable for the following components:  ? Color, Urine ORANGE (*)   ? APPearance HAZY (*)   ? Hgb urine dipstick LARGE (*)   ? Protein, ur 30 (*)   ? Leukocytes,Ua TRACE (*)   ? RBC / HPF >50 (*)   ? Bacteria, UA RARE (*)   ? All other components within normal limits  ?URINE CULTURE  ?LIPASE, BLOOD  ?PREGNANCY, URINE  ? ? ?EKG ?None ? ?Radiology ?No results found. ? ?Procedures ?Procedures  ? ? ?Medications Ordered in ED ?Medications  ?sodium chloride 0.9 % bolus 1,000 mL (1,000 mLs Intravenous New Bag/Given 05/20/21 1101)  ?ondansetron [facility]) injection 4 mg (4 mg Intravenous Given 05/20/21 1103)  ?HYDROmorphone (DILAUDID) injection 1 mg (1 mg Intravenous Given 05/20/21 1102)  ?diphenhydrAMINE (BENADRYL) injection 25 mg (25 mg Intravenous Given 05/20/21 1118)  ? ? ?ED Course/ Medical Decision Making/ A&P ?   ?                        ?Medical Decision Making ?Amount and/or Complexity of Data Reviewed ?Labs: ordered. ? ?Risk ?Prescription drug management. ? ? ?Chart review shows visit to outside ER May 18, 2021 for domino pain. ? ?Cardiac monitoring shows sinus rhythm tachycardic rate. ? ?Work-up includes labs.  White count normal 8 hemoglobin 11.8 chemistry unremarkable lipase normal.  Urinalysis shows blood but no WBCs and rare bacteria. ? ?Patient symptoms appear improved with IV Dilaudid Benadryl Zofran and IV fluid resuscitation. ? ?I advised the patient that she would most likely need to follow-up with OB/GYN.  She has an appointment  with OB/GYN doctor in a month.  I advised her to call to make a sooner appointment and to follow-up with a local OB as she is living here currently.  Otherwise advised outpatient follow-up as mentioned within the next week.  Advising immediate return for worsening symptoms or any additional concerns. ? ? ? ? ? ? ? ?Final Clinical Impression(s) / ED Diagnoses ?Final diagnoses:  ?Lower abdominal pain  ? ? ?Rx / DC Orders ?ED Discharge Orders   ? ? None  ? ?  ? ? ?  ?Luna Fuse, MD ?05/20/21 1125 ? ?

## 2021-05-20 NOTE — ED Notes (Signed)
Attempt IV placement in right AC, able to draw blood but was painful for pt when flushed, removed IV.  Attempt IV placement in left AC, also not successful.  Primary RN notified.  ?

## 2021-05-20 NOTE — ED Triage Notes (Signed)
Pt c/o abdominal pain for 1 month. Seen at [facility] and was told she had a bladder infection. Pt reports that she completed a round of antibiotics 2 weeks ago. Pt continues to have vomiting, dizziness and abdominal pain. Pt denies odor in urine or burning with urination. Positive for frequent urination. ?

## 2021-05-20 NOTE — ED Notes (Signed)
RT note: IV attempt unsuccessful R A/C. ?

## 2021-05-21 LAB — URINE CULTURE

## 2021-05-23 ENCOUNTER — Ambulatory Visit (HOSPITAL_COMMUNITY)
Admission: EM | Admit: 2021-05-23 | Discharge: 2021-05-23 | Disposition: A | Discharge status: Home / Self Care | Payer: PPO | Attending: Physician Assistant | Admitting: Physician Assistant

## 2021-05-23 ENCOUNTER — Other Ambulatory Visit: Payer: Self-pay

## 2021-05-23 ENCOUNTER — Encounter (HOSPITAL_COMMUNITY): Payer: Self-pay | Admitting: Emergency Medicine

## 2021-05-23 DIAGNOSIS — J069 Acute upper respiratory infection, unspecified: Secondary | ICD-10-CM | POA: Diagnosis not present

## 2021-05-23 MED ORDER — HYDROCOD POLI-CHLORPHE POLI ER 10-8 MG/5ML PO SUER: 5.0000 mL | Freq: Two times a day (BID) | ORAL | 0 refills | Status: DC | PRN

## 2021-05-23 NOTE — Discharge Instructions (Signed)
Take cough syrup as directed as needed ?Continue with Zofran as needed for nausea ?Keep appointment with PCP   ?

## 2021-05-23 NOTE — ED Provider Notes (Signed)
?[facility] ? ? ? ?CSN: [id number] ?Arrival date & time: 05/23/21  1934 ? ? ?  ? ?History   ?Chief Complaint ?Chief Complaint  ?Patient presents with  ? Cough  ? ? ?HPI ?[name] is a 26 y.o. female.  ? ?Pt complains of cough that started two days ago.  She reports vomiting after coughing.  She also reports she had a televisit earlier today and was advised to be seen in UC.  She also complains of weakness and fatigue over the last few days.  Reports mild postnasal drip.  She denies congestion, sinus pressure, fever, shill.  She has had ongoing abdominal pain and nausea for the last month.  She is currently taking Zofran with some relief of nausea.   ? ? ?Past Medical History:  ?Diagnosis Date  ? Asthma   ? Celiac disease   ? Collagen vascular disease ([place])   ? Diarrhea   ? Patient mentions diagnosis of ulcerative colitis but it is not clear she actually has UC  ? Endometriosis   ? Lupus ([place])   ? Ovarian cyst   ? Panic disorder 05/30/2020  ? Pulmonary embolus ([place]) 02/06/2020  ? ? ?Patient Active Problem List  ? Diagnosis Date Noted  ? Anxiety   ? Hematemesis 06/17/2020  ? 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 Panic disorder 05/30/2020  ? Major depressive disorder 05/29/2020  ? Suicide attempt by acetaminophen overdose ([place]) 05/29/2020  ? Obesity, Class III, BMI 40-49.9 (morbid obesity) ([place]) 04/05/2020  ? SOB (shortness of breath) 04/05/2020  ? Abdominal pain 04/04/2020  ? SLE (systemic lupus erythematosus related syndrome) ([place]) 04/04/2020  ? Shortness of breath 04/04/2020  ? Pneumonia 03/23/2020  ? Nausea 03/23/2020  ? Asthma 03/22/2020  ? Dysuria 02/03/2020  ? Hematuria 02/03/2020  ? Mass of urinary bladder 01/26/2020  ? Allergic rhinitis 05/05/2019  ? 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 Endometriosis 03/14/2018  ? Long-term use of high-risk medication 07/02/2017  ? Lupus anticoagulant positive 07/02/2017  ? Personal history of pulmonary embolism  07/02/2017  ? Other forms of systemic lupus erythematosus ([place]) 03/12/2017  ? Anemia 02/28/2017  ? Migraine 02/28/2017  ? Dysmenorrhea 08/28/2011  ? Gastritis 08/28/2011  ? ? ?Past Surgical History:  ?Procedure Laterality Date  ? APPENDECTOMY    ? CHOLECYSTECTOMY    ? ESOPHAGOGASTRODUODENOSCOPY (EGD) WITH PROPOFOL N/A 06/18/2020  ? Procedure: ESOPHAGOGASTRODUODENOSCOPY (EGD) WITH PROPOFOL;  Surgeon: Doran Stabler, MD;  Location: [facility];  Service: Gastroenterology;  Laterality: N/A;  ? EXCISION OF ENDOMETRIOMA    ? Ovarian cyst removal  ? excision of endometriosis    ? FOOT FRACTURE SURGERY    ? w/ hardware  ? HERNIA REPAIR    ? TONSILLECTOMY    ? ? ?OB History   ?No obstetric history on file. ?  ? ? ? ?Home Medications   ? ?Prior to Admission medications   ?Medication Sig Start Date End Date Taking? Authorizing Provider  ?chlorpheniramine-HYDROcodone (TUSSIONEX PENNKINETIC ER) 10-8 MG/5ML Take 5 mLs by mouth every 12 (twelve) hours as needed for cough. 05/23/21  Yes Ward, Lenise Arena, PA-C  ?ALPRAZolam (XANAX) 1 MG tablet Take 1.5 mg by mouth 2 (two) times daily. ?Patient not taking: Reported on 05/18/2021 02/13/21   [provider]  ?apixaban (ELIQUIS) 5 MG TABS tablet Take 5 mg by mouth 2 (two) times daily.    [provider]  ?EPINEPHrine 0.3 mg/0.3 mL IJ SOAJ injection Inject 0.3  mg into the muscle daily as needed (Allergic Reaction). ?Patient not taking: Reported on 05/18/2021 06/01/20   Lindell Spar I, NP  ?HYDROcodone-acetaminophen (HYCET) 7.5-325 mg/15 ml solution Take 15 mLs by mouth 4 (four) times daily as needed for moderate pain. ?Patient not taking: Reported on 05/18/2021 04/17/21 04/17/22  Blanchie Dessert, MD  ?hydroxychloroquine (PLAQUENIL) 200 MG tablet Take 2 tablets (400 mg total) by mouth daily. ?Patient not taking: Reported on 05/18/2021 03/23/20   Collier Salina, MD  ?norethindrone (AYGESTIN) 5 MG tablet Take 1 tablet (5 mg total) by mouth at bedtime. 06/18/20   Carollee Leitz,  MD  ?oxyCODONE-acetaminophen (PERCOCET) 5-325 MG tablet Take 1-2 tablets by mouth every 4 (four) hours as needed. ?Patient not taking: Reported on 05/18/2021 04/23/21   Orpah Greek, MD  ?zolpidem (AMBIEN) 10 MG tablet Take 20 mg by mouth at bedtime. ?Patient not taking: Reported on 05/18/2021 06/02/20   [provider]  ?dicyclomine (BENTYL) 20 MG tablet Take 1 tablet (20 mg total) by mouth 2 (two) times daily. 11/29/18 04/11/19  Muthersbaugh, Jarrett Soho, PA-C  ?Fluticasone Propionate HFA (FLOVENT HFA IN) Inhale into the lungs. ?Patient not taking: Reported on 11/14/2019  11/14/19  [provider]  ? ? ?Family History ?Family History  ?Problem Relation Age of Onset  ? Hypertension Mother   ? Hypercholesterolemia Mother   ? Rheum arthritis Mother   ? Diabetes Father   ? Hypertension Father   ? Cancer Father   ? Autism Brother   ? ? ?Social History ?Social History  ? ?Tobacco Use  ? Smoking status: Never  ? Smokeless tobacco: Never  ?Vaping Use  ? Vaping Use: Never used  ?Substance Use Topics  ? Alcohol use: Never  ? Drug use: Never  ? ? ? ?Allergies   ?Azithromycin, Peanut-containing drug products, Iodinated contrast media, Other, Amoxicillin, Fish allergy, Gluten meal, Hydromet [hydrocodone bit-homatrop mbr], Ibuprofen, Lactose intolerance (gi), Morphine and related, and Pantoprazole ? ? ?Review of Systems ?Review of Systems  ?Constitutional:  Negative for chills and fever.  ?HENT:  Positive for postnasal drip. Negative for congestion, ear pain and sore throat.   ?Eyes:  Negative for pain and visual disturbance.  ?Respiratory:  Positive for cough. Negative for shortness of breath.   ?Cardiovascular:  Negative for chest pain and palpitations.  ?Gastrointestinal:  Positive for nausea and vomiting. Negative for abdominal pain.  ?Genitourinary:  Negative for dysuria and hematuria.  ?Musculoskeletal:  Negative for arthralgias and back pain.  ?Skin:  Negative for color change and rash.  ?Neurological:   Negative for seizures and syncope.  ?All other systems reviewed and are negative. ? ? ?Physical Exam ?Triage Vital Signs ?ED Triage Vitals  ?Enc Vitals Group  ?   BP 05/23/21 2001 124/79  ?   Pulse Rate 05/23/21 2001 (!) 121  ?   Resp 05/23/21 2001 (!) 22  ?   Temp 05/23/21 2001 98.2 ?F (36.8 ?C)  ?   Temp Source 05/23/21 2001 Oral  ?   SpO2 05/23/21 2001 96 %  ?   Weight --   ?   Height --   ?   Head Circumference --   ?   Peak Flow --   ?   Pain Score 05/23/21 1958 9  ?   Pain Loc --   ?   Pain Edu? --   ?   Excl. in [place]? --   ? ?No data found. ? ?Updated Vital Signs ?BP 124/79 (BP Location: Left Arm)  Comment (BP Location): large cuff  Pulse (!) 121 Comment: coughing frequently  Temp 98.2 ?F (36.8 ?C) (Oral)   Resp (!) 22 Comment: coughing frequently  LMP 05/15/2021 (Approximate)   SpO2 96%  ? ?Visual Acuity ?Right Eye Distance:   ?Left Eye Distance:   ?Bilateral Distance:   ? ?Right Eye Near:   ?Left Eye Near:    ?Bilateral Near:    ? ?Physical Exam ?Vitals and nursing note reviewed.  ?Constitutional:   ?   General: She is not in acute distress. ?   Appearance: She is well-developed.  ?HENT:  ?   Head: Normocephalic and atraumatic.  ?Eyes:  ?   Conjunctiva/sclera: Conjunctivae normal.  ?Cardiovascular:  ?   Rate and Rhythm: Normal rate and regular rhythm.  ?   Heart sounds: No murmur heard. ?Pulmonary:  ?   Effort: Pulmonary effort is normal. No respiratory distress.  ?   Breath sounds: Normal breath sounds.  ?   Comments: Coughing on exam with posttussive vomiting.  Lungs clear to auscultation.  ?Abdominal:  ?   Palpations: Abdomen is soft.  ?   Tenderness: There is no abdominal tenderness.  ?Musculoskeletal:     ?   General: No swelling.  ?   Cervical back: Neck supple.  ?Skin: ?   General: Skin is warm and dry.  ?   Capillary Refill: Capillary refill takes less than 2 seconds.  ?Neurological:  ?   Mental Status: She is alert.  ?Psychiatric:     ?   Mood and Affect: Mood normal.  ? ? ? ?UC Treatments /  Results  ?Labs ?(all labs ordered are listed, but only abnormal results are displayed) ?Labs Reviewed - No data to display ? ?EKG ? ? ?Radiology ?No results found. ? ?Procedures ?Procedures (including critical

## 2021-05-23 NOTE — ED Triage Notes (Signed)
Patient reports a cough x 2 days.  Pcp seen by tele-medicine was instructed to come to ucc.  Denies runny nose or ear pain, stuffy nose.   ? ? ?Patient has had ongoing issues with abdominal pain for a month.  Patient has been seen several times for this.   ? ?Patient feels weak and sluggish.  Patient reports coughing until she vomits ?

## 2021-05-25 LAB — CULTURE, BLOOD (ROUTINE X 2)
Culture: NO GROWTH
Culture: NO GROWTH
Special Requests: ADEQUATE
Special Requests: ADEQUATE

## 2021-06-03 ENCOUNTER — Encounter (HOSPITAL_BASED_OUTPATIENT_CLINIC_OR_DEPARTMENT_OTHER): Payer: Self-pay

## 2021-06-03 ENCOUNTER — Encounter (HOSPITAL_COMMUNITY): Payer: Self-pay

## 2021-06-03 ENCOUNTER — Other Ambulatory Visit: Payer: Self-pay

## 2021-06-03 ENCOUNTER — Emergency Department (HOSPITAL_COMMUNITY)
Admission: EM | Admit: 2021-06-03 | Discharge: 2021-06-03 | Disposition: A | Discharge status: Home / Self Care | Payer: PPO | Source: Home / Self Care | Attending: Emergency Medicine | Admitting: Emergency Medicine

## 2021-06-03 ENCOUNTER — Emergency Department (HOSPITAL_BASED_OUTPATIENT_CLINIC_OR_DEPARTMENT_OTHER)
Admission: EM | Admit: 2021-06-03 | Discharge: 2021-06-03 | Disposition: A | Discharge status: Home / Self Care | Payer: PPO | Attending: Emergency Medicine | Admitting: Emergency Medicine

## 2021-06-03 ENCOUNTER — Emergency Department (HOSPITAL_COMMUNITY): Payer: PPO

## 2021-06-03 DIAGNOSIS — Z5321 Procedure and treatment not carried out due to patient leaving prior to being seen by health care provider: Secondary | ICD-10-CM | POA: Diagnosis not present

## 2021-06-03 DIAGNOSIS — E876 Hypokalemia: Secondary | ICD-10-CM | POA: Insufficient documentation

## 2021-06-03 DIAGNOSIS — Z79899 Other long term (current) drug therapy: Secondary | ICD-10-CM | POA: Insufficient documentation

## 2021-06-03 DIAGNOSIS — Z91010 Allergy to peanuts: Secondary | ICD-10-CM | POA: Insufficient documentation

## 2021-06-03 DIAGNOSIS — M79671 Pain in right foot: Secondary | ICD-10-CM | POA: Insufficient documentation

## 2021-06-03 DIAGNOSIS — D649 Anemia, unspecified: Secondary | ICD-10-CM | POA: Diagnosis not present

## 2021-06-03 DIAGNOSIS — J45909 Unspecified asthma, uncomplicated: Secondary | ICD-10-CM | POA: Insufficient documentation

## 2021-06-03 DIAGNOSIS — R569 Unspecified convulsions: Secondary | ICD-10-CM | POA: Insufficient documentation

## 2021-06-03 DIAGNOSIS — Z7901 Long term (current) use of anticoagulants: Secondary | ICD-10-CM | POA: Insufficient documentation

## 2021-06-03 HISTORY — DX: Unspecified convulsions: R56.9

## 2021-06-03 LAB — URINALYSIS, ROUTINE W REFLEX MICROSCOPIC
Bilirubin Urine: NEGATIVE
Glucose, UA: NEGATIVE mg/dL
Hgb urine dipstick: NEGATIVE
Ketones, ur: NEGATIVE mg/dL
Leukocytes,Ua: NEGATIVE
Nitrite: NEGATIVE
Protein, ur: NEGATIVE mg/dL
Specific Gravity, Urine: 1.018 (ref 1.005–1.030)
pH: 5 (ref 5.0–8.0)

## 2021-06-03 LAB — COMPREHENSIVE METABOLIC PANEL
ALT: 25 U/L (ref 0–44)
AST: 31 U/L (ref 15–41)
Albumin: 4 g/dL (ref 3.5–5.0)
Alkaline Phosphatase: 68 U/L (ref 38–126)
Anion gap: 7 (ref 5–15)
BUN: 6 mg/dL (ref 6–20)
CO2: 27 mmol/L (ref 22–32)
Calcium: 9.4 mg/dL (ref 8.9–10.3)
Chloride: 107 mmol/L (ref 98–111)
Creatinine, Ser: 0.81 mg/dL (ref 0.44–1.00)
GFR, Estimated: >60 mL/min (ref >60)
Glucose, Bld: 104 mg/dL — ABNORMAL HIGH (ref 70–99)
Potassium: 3.2 mmol/L — ABNORMAL LOW (ref 3.5–5.1)
Sodium: 141 mmol/L (ref 135–145)
Total Bilirubin: 0.4 mg/dL (ref 0.3–1.2)
Total Protein: 6.8 g/dL (ref 6.5–8.1)

## 2021-06-03 LAB — CBC WITH DIFFERENTIAL/PLATELET
Abs Immature Granulocytes: 0.02 10*3/uL (ref 0.00–0.07)
Basophils Absolute: 0 10*3/uL (ref 0.0–0.1)
Basophils Relative: 0 %
Eosinophils Absolute: 0.7 10*3/uL — ABNORMAL HIGH (ref 0.0–0.5)
Eosinophils Relative: 9 %
HCT: 36.9 % (ref 36.0–46.0)
Hemoglobin: 11.8 g/dL — ABNORMAL LOW (ref 12.0–15.0)
Immature Granulocytes: 0 %
Lymphocytes Relative: 33 %
Lymphs Abs: 2.5 10*3/uL (ref 0.7–4.0)
MCH: 24.7 pg — ABNORMAL LOW (ref 26.0–34.0)
MCHC: 32 g/dL (ref 30.0–36.0)
MCV: 77.4 fL — ABNORMAL LOW (ref 80.0–100.0)
Monocytes Absolute: 0.6 10*3/uL (ref 0.1–1.0)
Monocytes Relative: 8 %
Neutro Abs: 3.8 10*3/uL (ref 1.7–7.7)
Neutrophils Relative %: 50 %
Platelets: 345 10*3/uL (ref 150–400)
RBC: 4.77 MIL/uL (ref 3.87–5.11)
RDW: 18.3 % — ABNORMAL HIGH (ref 11.5–15.5)
WBC: 7.7 10*3/uL (ref 4.0–10.5)
nRBC: 0 % (ref 0.0–0.2)

## 2021-06-03 LAB — I-STAT BETA HCG BLOOD, ED (MC, WL, AP ONLY): I-stat hCG, quantitative: <5 m[IU]/mL (ref <5)

## 2021-06-03 LAB — MAGNESIUM: Magnesium: 2.1 mg/dL (ref 1.7–2.4)

## 2021-06-03 LAB — RAPID URINE DRUG SCREEN, HOSP PERFORMED
Amphetamines: NOT DETECTED
Barbiturates: NOT DETECTED
Benzodiazepines: POSITIVE — AB
Cocaine: NOT DETECTED
Opiates: POSITIVE — AB
Tetrahydrocannabinol: NOT DETECTED

## 2021-06-03 LAB — CBG MONITORING, ED: Glucose-Capillary: 108 mg/dL — ABNORMAL HIGH (ref 70–99)

## 2021-06-03 LAB — ETHANOL: Alcohol, Ethyl (B): <10 mg/dL (ref <10)

## 2021-06-03 MED ORDER — LEVETIRACETAM IN NACL 1500 MG/100ML IV SOLN
1500.0000 mg | Freq: Once | INTRAVENOUS | Status: AC
  Administered 2021-06-03: 1500 mg via INTRAVENOUS
  Filled 2021-06-03: qty 100

## 2021-06-03 MED ORDER — ACETAMINOPHEN 500 MG PO TABS
1000.0000 mg | ORAL_TABLET | Freq: Once | ORAL | Status: AC
  Administered 2021-06-03: 1000 mg via ORAL
  Filled 2021-06-03: qty 2

## 2021-06-03 MED ORDER — SODIUM CHLORIDE 0.9 % IV BOLUS
1000.0000 mL | Freq: Once | INTRAVENOUS | Status: AC
  Administered 2021-06-03: 1000 mL via INTRAVENOUS

## 2021-06-03 MED ORDER — SODIUM CHLORIDE 0.9 % IV SOLN: INTRAVENOUS | Status: DC

## 2021-06-03 MED ORDER — LEVETIRACETAM 500 MG PO TABS: 500.0000 mg | ORAL_TABLET | Freq: Two times a day (BID) | ORAL | 0 refills | Status: DC

## 2021-06-03 MED ORDER — METHOCARBAMOL 500 MG PO TABS: 500.0000 mg | ORAL_TABLET | Freq: Two times a day (BID) | ORAL | 0 refills | Status: DC

## 2021-06-03 NOTE — ED Notes (Signed)
Transferred pt to commode. Pt was unsuccessful. Pt states she will give permission for a straight-cath if she is given pain meds. ?

## 2021-06-03 NOTE — ED Provider Notes (Signed)
?[facility] DEPT ?Provider Note ? ? ?CSN: [id number] ?Arrival date & time: 06/03/21  1239 ? ?  ? ?History ? ?Chief Complaint  ?Patient presents with  ? Seizures  ? ? ?[name] is a 26 y.o. female. ? ?Pt is a 26 yo female with pmhx significant for asthma, lupus, celiac disease, endometriosis, ovarian cysts, PE (on Eliquis), collagen vascular disease, and seizures.  Pt has had right foot pain and was actually on her way to come to the ED to get her foot checked when she had a seizure.  Pt said she stopped taking her [place] in Jan.  She does not have a neurologist in town. She stopped because she thought she would be fine since she has not had any recent seizures.  No f/c.   ? ? ?  ? ?Home Medications ?Prior to Admission medications   ?Medication Sig Start Date End Date Taking? Authorizing Provider  ?ALPRAZolam (XANAX) 1 MG tablet Take 1.5 mg by mouth 2 (two) times daily. 02/13/21  Yes [provider]  ?apixaban (ELIQUIS) 5 MG TABS tablet Take 5 mg by mouth 2 (two) times daily.   Yes [provider]  ?chlorpheniramine-HYDROcodone (TUSSIONEX PENNKINETIC ER) 10-8 MG/5ML Take 5 mLs by mouth every 12 (twelve) hours as needed for cough. 05/23/21  Yes Ward, Lenise Arena, PA-C  ?DULoxetine (CYMBALTA) 30 MG capsule Take 30 mg by mouth daily. 05/22/21  Yes [provider]  ?EPINEPHrine 0.3 mg/0.3 mL IJ SOAJ injection Inject 0.3 mg into the muscle daily as needed (Allergic Reaction). 06/01/20  Yes Lindell Spar I, NP  ?hydroxychloroquine (PLAQUENIL) 200 MG tablet Take 2 tablets (400 mg total) by mouth daily. 03/23/20  Yes Rice, Resa Miner, MD  ?levETIRAcetam (KEPPRA) 500 MG tablet Take 1 tablet (500 mg total) by mouth 2 (two) times daily. 06/03/21  Yes Isla Pence, MD  ?methocarbamol (ROBAXIN) 500 MG tablet Take 1 tablet (500 mg total) by mouth 2 (two) times daily. 06/03/21  Yes Isla Pence, MD  ?zolpidem (AMBIEN) 10 MG tablet Take 20 mg by mouth at bedtime. 06/02/20   Yes [provider]  ?dicyclomine (BENTYL) 20 MG tablet Take 1 tablet (20 mg total) by mouth 2 (two) times daily. 11/29/18 04/11/19  Muthersbaugh, Jarrett Soho, PA-C  ?Fluticasone Propionate HFA (FLOVENT HFA IN) Inhale into the lungs. ?Patient not taking: Reported on 11/14/2019  11/14/19  [provider]  ?   ? ?Allergies    ?Azithromycin, Peanut-containing drug products, Iodinated contrast media, Other, Amoxicillin, Fish allergy, Gluten meal, Hydromet [hydrocodone bit-homatrop mbr], Ibuprofen, Lactose intolerance (gi), Morphine and related, and Pantoprazole   ? ?Review of Systems   ?Review of Systems  ?Musculoskeletal:   ?     Right foot pain  ?Neurological:  Positive for seizures.  ?All other systems reviewed and are negative. ? ?Physical Exam ?Updated Vital Signs ?BP (!) 157/75   Pulse 94   Temp 97.7 ?F (36.5 ?C) (Oral)   Resp 15   LMP 05/15/2021 (Approximate)   SpO2 93%  ?Physical Exam ?Vitals and nursing note reviewed.  ?Constitutional:   ?   Appearance: Normal appearance.  ?HENT:  ?   Head: Normocephalic and atraumatic.  ?   Right Ear: External ear normal.  ?   Left Ear: External ear normal.  ?   Nose: Nose normal.  ?   Mouth/Throat:  ?   Mouth: Mucous membranes are moist.  ?   Pharynx: Oropharynx is clear.  ?Eyes:  ?   Extraocular  Movements: Extraocular movements intact.  ?   Conjunctiva/sclera: Conjunctivae normal.  ?   Pupils: Pupils are equal, round, and reactive to light.  ?Cardiovascular:  ?   Rate and Rhythm: Normal rate and regular rhythm.  ?   Pulses: Normal pulses.  ?   Heart sounds: Normal heart sounds.  ?Pulmonary:  ?   Effort: Pulmonary effort is normal.  ?   Breath sounds: Normal breath sounds.  ?Abdominal:  ?   General: Abdomen is flat. Bowel sounds are normal.  ?   Palpations: Abdomen is soft.  ?Musculoskeletal:  ?   Cervical back: Normal range of motion and neck supple.  ?     Legs: ? ?   Comments: Pt is wearing a boot which was removed for exam.  ?Skin: ?   General: Skin is  warm.  ?   Capillary Refill: Capillary refill takes less than 2 seconds.  ?Neurological:  ?   General: No focal deficit present.  ?   Mental Status: She is alert and oriented to person, place, and time.  ?Psychiatric:     ?   Mood and Affect: Mood normal.     ?   Behavior: Behavior normal.  ? ? ?ED Results / Procedures / Treatments   ?Labs ?(all labs ordered are listed, but only abnormal results are displayed) ?Labs Reviewed  ?COMPREHENSIVE METABOLIC PANEL - Abnormal; Notable for the following components:  ?    Result Value  ? Potassium 3.2 (*)   ? Glucose, Bld 104 (*)   ? All other components within normal limits  ?CBC WITH DIFFERENTIAL/PLATELET - Abnormal; Notable for the following components:  ? Hemoglobin 11.8 (*)   ? MCV 77.4 (*)   ? MCH 24.7 (*)   ? RDW 18.3 (*)   ? Eosinophils Absolute 0.7 (*)   ? All other components within normal limits  ?URINALYSIS, ROUTINE W REFLEX MICROSCOPIC - Abnormal; Notable for the following components:  ? APPearance HAZY (*)   ? All other components within normal limits  ?CBG MONITORING, ED - Abnormal; Notable for the following components:  ? Glucose-Capillary 108 (*)   ? All other components within normal limits  ?MAGNESIUM  ?ETHANOL  ?RAPID URINE DRUG SCREEN, HOSP PERFORMED  ?I-STAT BETA HCG BLOOD, ED ([facility], [facility], [facility] ONLY)  ? ? ?EKG ?None ? ?Radiology ?DG Ankle Complete Right ? ?Result Date: 06/03/2021 ?CLINICAL DATA:  Injury with pain. EXAM: RIGHT ANKLE - COMPLETE 3+ VIEW COMPARISON:  None Available. FINDINGS: There is no evidence of fracture, dislocation, or joint effusion. There is no evidence of arthropathy or other focal bone abnormality. Soft tissues are unremarkable. IMPRESSION: Negative. Electronically Signed   By: Misty Stanley M.D.   On: 06/03/2021 13:44  ? ?DG Foot Complete Right ? ?Result Date: 06/03/2021 ?CLINICAL DATA:  Crush injury. Pain to the right little toe and right ankle. EXAM: RIGHT FOOT COMPLETE - 3+ VIEW COMPARISON:  None Available. FINDINGS: No evidence for an  acute fracture. No subluxation or dislocation. Fixation hardware noted at the base of the fifth metatarsal. IMPRESSION: Negative. Electronically Signed   By: Misty Stanley M.D.   On: 06/03/2021 13:43   ? ?Procedures ?Procedures  ? ? ?Medications Ordered in ED ?Medications  ?sodium chloride 0.9 % bolus 1,000 mL (0 mLs Intravenous Stopped 06/03/21 1428)  ?  And  ?0.9 %  sodium chloride infusion ( Intravenous New Bag/Given 06/03/21 1723)  ?levETIRAcetam (KEPPRA) IVPB 1500 mg/ 100 mL premix (0 mg Intravenous Stopped 06/03/21 1428)  ?  acetaminophen (TYLENOL) tablet 1,000 mg (1,000 mg Oral Given 06/03/21 1953)  ? ? ?ED Course/ Medical Decision Making/ A&P ?  ?                        ?Medical Decision Making ?Amount and/or Complexity of Data Reviewed ?Labs: ordered. ?Radiology: ordered. ? ?Risk ?OTC drugs. ?Prescription drug management. ? ? ?This patient presents to the ED for concern of seizure, this involves an extensive number of treatment options, and is a complaint that carries with it a high risk of complications and morbidity.  The differential diagnosis includes medication noncompliance, electrolyte abn ? ? ?Co morbidities that complicate the patient evaluation ? ?sthma, lupus, celiac disease, endometriosis, ovarian cysts, PE (on Eliquis), collagen vascular disease, and seizures ? ? ?Additional history obtained: ? ?Additional history obtained from epic chart review ?External records from outside source obtained and reviewed including EMS report ? ? ?Lab Tests: ? ?I Ordered, and personally interpreted labs.  The pertinent results include:  cbc nl other than mild chronic anemia (11.8); cmp nl other than slightly low K of 3.2; Mg is nl; preg neg; etoh neg; ua neg ? ? ?Imaging Studies ordered: ? ?I ordered imaging studies including right ankle and foot  ?I independently visualized and interpreted imaging which showed  ?Right ankle: ?  ?IMPRESSION:  ?Negative.  ?Right foot: ?   ?FINDINGS:  ?No evidence for an acute fracture.  No subluxation or dislocation.  ?Fixation hardware noted at the base of the fifth metatarsal.  ?   ?IMPRESSION:  ?Negative.  ? ?I agree with the radiologist interpretation ? ? ?Cardiac Monitoring: ? ?The pat

## 2021-06-03 NOTE — ED Triage Notes (Signed)
EMS reports from home, Pt called for seizure. Hx of seizures. Pt non-compliant with medication. Pt states she stopped taking her Keppra in January, "because she wanted to". Pt was A&O in parking garage when EMS arrived. ? ?BP 143/80 ?HR 100 ?RR 18 ?Sp02 98 RA ?CBG 134 ? ?22ga Left hand. ?

## 2021-06-03 NOTE — Discharge Instructions (Addendum)
Do not drive until cleared by neurology. ?

## 2021-06-03 NOTE — ED Triage Notes (Signed)
Pt BIB EMS from home with c/o right foot pain after having ice dropped on her foot. Pt went to [facility] and was told there were no broken bones but was placed in a cam walker boot. Pt has a metal plate in her foot. Per EMS pt is acting intoxicated. Pt states that she took some cough medicine for the pain.  ?

## 2021-06-04 ENCOUNTER — Ambulatory Visit (HOSPITAL_COMMUNITY)
Admission: EM | Admit: 2021-06-04 | Discharge: 2021-06-04 | Disposition: A | Discharge status: Home / Self Care | Payer: PPO

## 2021-06-04 ENCOUNTER — Other Ambulatory Visit: Payer: Self-pay

## 2021-06-04 ENCOUNTER — Emergency Department (HOSPITAL_COMMUNITY)
Admission: EM | Admit: 2021-06-04 | Discharge: 2021-06-04 | Discharge status: Against Medical Advice | Payer: PPO | Attending: Emergency Medicine | Admitting: Emergency Medicine

## 2021-06-04 ENCOUNTER — Encounter (HOSPITAL_COMMUNITY): Payer: Self-pay

## 2021-06-04 ENCOUNTER — Encounter (HOSPITAL_COMMUNITY): Payer: Self-pay | Admitting: Emergency Medicine

## 2021-06-04 ENCOUNTER — Emergency Department (HOSPITAL_COMMUNITY): Payer: PPO

## 2021-06-04 DIAGNOSIS — R471 Dysarthria and anarthria: Secondary | ICD-10-CM

## 2021-06-04 DIAGNOSIS — J029 Acute pharyngitis, unspecified: Secondary | ICD-10-CM | POA: Diagnosis present

## 2021-06-04 DIAGNOSIS — R4781 Slurred speech: Secondary | ICD-10-CM | POA: Insufficient documentation

## 2021-06-04 DIAGNOSIS — M791 Myalgia, unspecified site: Secondary | ICD-10-CM | POA: Diagnosis not present

## 2021-06-04 DIAGNOSIS — Z20822 Contact with and (suspected) exposure to covid-19: Secondary | ICD-10-CM | POA: Diagnosis not present

## 2021-06-04 DIAGNOSIS — Z5321 Procedure and treatment not carried out due to patient leaving prior to being seen by health care provider: Secondary | ICD-10-CM | POA: Insufficient documentation

## 2021-06-04 LAB — CBC WITH DIFFERENTIAL/PLATELET
Abs Immature Granulocytes: 0.04 10*3/uL (ref 0.00–0.07)
Basophils Absolute: 0 10*3/uL (ref 0.0–0.1)
Basophils Relative: 0 %
Eosinophils Absolute: 0.5 10*3/uL (ref 0.0–0.5)
Eosinophils Relative: 5 %
HCT: 37.1 % (ref 36.0–46.0)
Hemoglobin: 11.8 g/dL — ABNORMAL LOW (ref 12.0–15.0)
Immature Granulocytes: 0 %
Lymphocytes Relative: 25 %
Lymphs Abs: 2.5 10*3/uL (ref 0.7–4.0)
MCH: 24.4 pg — ABNORMAL LOW (ref 26.0–34.0)
MCHC: 31.8 g/dL (ref 30.0–36.0)
MCV: 76.7 fL — ABNORMAL LOW (ref 80.0–100.0)
Monocytes Absolute: 0.6 10*3/uL (ref 0.1–1.0)
Monocytes Relative: 6 %
Neutro Abs: 6.5 10*3/uL (ref 1.7–7.7)
Neutrophils Relative %: 64 %
Platelets: 326 10*3/uL (ref 150–400)
RBC: 4.84 MIL/uL (ref 3.87–5.11)
RDW: 18.1 % — ABNORMAL HIGH (ref 11.5–15.5)
WBC: 10.2 10*3/uL (ref 4.0–10.5)
nRBC: 0 % (ref 0.0–0.2)

## 2021-06-04 LAB — COMPREHENSIVE METABOLIC PANEL
ALT: 22 U/L (ref 0–44)
AST: 26 U/L (ref 15–41)
Albumin: 3.8 g/dL (ref 3.5–5.0)
Alkaline Phosphatase: 64 U/L (ref 38–126)
Anion gap: 8 (ref 5–15)
BUN: <5 mg/dL — ABNORMAL LOW (ref 6–20)
CO2: 24 mmol/L (ref 22–32)
Calcium: 9.3 mg/dL (ref 8.9–10.3)
Chloride: 109 mmol/L (ref 98–111)
Creatinine, Ser: 0.85 mg/dL (ref 0.44–1.00)
GFR, Estimated: >60 mL/min (ref >60)
Glucose, Bld: 79 mg/dL (ref 70–99)
Potassium: 3.4 mmol/L — ABNORMAL LOW (ref 3.5–5.1)
Sodium: 141 mmol/L (ref 135–145)
Total Bilirubin: 0.8 mg/dL (ref 0.3–1.2)
Total Protein: 6.8 g/dL (ref 6.5–8.1)

## 2021-06-04 LAB — RESP PANEL BY RT-PCR (RSV, FLU A&B, COVID)  RVPGX2
Influenza A by PCR: NEGATIVE
Influenza B by PCR: NEGATIVE
Resp Syncytial Virus by PCR: NEGATIVE
SARS Coronavirus 2 by RT PCR: NEGATIVE

## 2021-06-04 LAB — LIPASE, BLOOD: Lipase: 24 U/L (ref 11–51)

## 2021-06-04 LAB — ETHANOL: Alcohol, Ethyl (B): <10 mg/dL (ref <10)

## 2021-06-04 NOTE — ED Triage Notes (Signed)
Pt reports to UC today for concern for slurred speech that has been days but has gotten worse. Was seen yesterday at ED for seizure. Pt c/o right foot pain from injury during seizure that happened yesterday and came in with Cam boot on.  ?

## 2021-06-04 NOTE — Discharge Instructions (Signed)
Please go straight to the emergency department for further evaluation and management of your slurred speech. ?

## 2021-06-04 NOTE — ED Notes (Signed)
Patient left on own accord. Insisted on leaving and going to urgent care ?
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
## 2021-06-04 NOTE — ED Notes (Signed)
[facility] made aware of transport needed to ED.  ?Lexicographer made aware of patient coming ?

## 2021-06-04 NOTE — ED Provider Notes (Signed)
?[place] ? ? ? ?CSN: [id number] ?Arrival date & time: 06/04/21  1722 ? ? ?  ? ?History   ?Chief Complaint ?Chief Complaint  ?Patient presents with  ? Aphasia  ? ? ?HPI ?[name] is a 26 y.o. female.  ? ?Patient presents to urgent care with slurred speech since yesterday that has gotten worse over the last 24 hours.  She states that she wants it to stop but does not know how to.  She has a past medical history of lupus, seizures, asthma, and a pulmonary embolus (she takes Eliquis).  She reports that she has fallen multiple times over the last 48 hours.  She does not remember if she has hit her head when questioned and states that she "does not remember a whole lot about the last 2 days of her life".  She denies head pain at this time.  She stopped taking her Keppra in January and took a dose yesterday because she had a seizure.  She was seen in the ED yesterday for her right foot pain.  ? ? ? ?Past Medical History:  ?Diagnosis Date  ? Asthma   ? Celiac disease   ? Collagen vascular disease ([place])   ? Diarrhea   ? Patient mentions diagnosis of ulcerative colitis but it is not clear she actually has UC  ? Endometriosis   ? Lupus ([place])   ? Ovarian cyst   ? Panic disorder 05/30/2020  ? Pulmonary embolus ([place]) 02/06/2020  ? Seizures ([place])   ? ? ?Patient Active Problem List  ? Diagnosis Date Noted  ? Anxiety   ? Hematemesis 06/17/2020  ? 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 Panic disorder 05/30/2020  ? Major depressive disorder 05/29/2020  ? Suicide attempt by acetaminophen overdose ([place]) 05/29/2020  ? Obesity, Class III, BMI 40-49.9 (morbid obesity) ([place]) 04/05/2020  ? SOB (shortness of breath) 04/05/2020  ? Abdominal pain 04/04/2020  ? SLE (systemic lupus erythematosus related syndrome) ([place]) 04/04/2020  ? Shortness of breath 04/04/2020  ? Pneumonia 03/23/2020  ? Nausea 03/23/2020  ? Asthma 03/22/2020  ? Dysuria 02/03/2020  ? Hematuria 02/03/2020  ?  Mass of urinary bladder 01/26/2020  ? Allergic rhinitis 05/05/2019  ? 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 Endometriosis 03/14/2018  ? Long-term use of high-risk medication 07/02/2017  ? Lupus anticoagulant positive 07/02/2017  ? Personal history of pulmonary embolism 07/02/2017  ? Other forms of systemic lupus erythematosus ([place]) 03/12/2017  ? Anemia 02/28/2017  ? Migraine 02/28/2017  ? Dysmenorrhea 08/28/2011  ? Gastritis 08/28/2011  ? ? ?Past Surgical History:  ?Procedure Laterality Date  ? APPENDECTOMY    ? CHOLECYSTECTOMY    ? ESOPHAGOGASTRODUODENOSCOPY (EGD) WITH PROPOFOL N/A 06/18/2020  ? Procedure: ESOPHAGOGASTRODUODENOSCOPY (EGD) WITH PROPOFOL;  Surgeon: Doran Stabler, MD;  Location: [place];  Service: Gastroenterology;  Laterality: N/A;  ? EXCISION OF ENDOMETRIOMA    ? Ovarian cyst removal  ? excision of endometriosis    ? FOOT FRACTURE SURGERY    ? w/ hardware  ? HERNIA REPAIR    ? TONSILLECTOMY    ? ? ?OB History   ?No obstetric history on file. ?  ? ? ? ?Home Medications   ? ?Prior to Admission medications   ?Medication Sig Start Date End Date Taking? Authorizing Provider  ?ALPRAZolam (XANAX) 1 MG tablet Take 1.5 mg by mouth 2 (two) times daily. 02/13/21   [provider]  ?apixaban (ELIQUIS) 5 MG TABS tablet  Take 5 mg by mouth 2 (two) times daily.    [provider]  ?chlorpheniramine-HYDROcodone Amanda Cockayne PENNKINETIC ER) 10-8 MG/5ML Take 5 mLs by mouth every 12 (twelve) hours as needed for cough. 05/23/21   Ward, Lenise Arena, PA-C  ?DULoxetine (CYMBALTA) 30 MG capsule Take 30 mg by mouth daily. 05/22/21   [provider]  ?EPINEPHrine 0.3 mg/0.3 mL IJ SOAJ injection Inject 0.3 mg into the muscle daily as needed (Allergic Reaction). 06/01/20   Lindell Spar I, NP  ?hydroxychloroquine (PLAQUENIL) 200 MG tablet Take 2 tablets (400 mg total) by mouth daily. 03/23/20   Collier Salina, MD  ?levETIRAcetam (KEPPRA) 500 MG tablet Take 1 tablet (500 mg total) by mouth 2 (two) times  daily. 06/03/21   Isla Pence, MD  ?methocarbamol (ROBAXIN) 500 MG tablet Take 1 tablet (500 mg total) by mouth 2 (two) times daily. 06/03/21   Isla Pence, MD  ?zolpidem (AMBIEN) 10 MG tablet Take 20 mg by mouth at bedtime. 06/02/20   [provider]  ?dicyclomine (BENTYL) 20 MG tablet Take 1 tablet (20 mg total) by mouth 2 (two) times daily. 11/29/18 04/11/19  Muthersbaugh, Jarrett Soho, PA-C  ?Fluticasone Propionate HFA (FLOVENT HFA IN) Inhale into the lungs. ?Patient not taking: Reported on 11/14/2019  11/14/19  [provider]  ? ? ?Family History ?Family History  ?Problem Relation Age of Onset  ? Hypertension Mother   ? Hypercholesterolemia Mother   ? Rheum arthritis Mother   ? Diabetes Father   ? Hypertension Father   ? Cancer Father   ? Autism Brother   ? ? ?Social History ?Social History  ? ?Tobacco Use  ? Smoking status: Never  ? Smokeless tobacco: Never  ?Vaping Use  ? Vaping Use: Never used  ?Substance Use Topics  ? Alcohol use: Never  ? Drug use: Never  ? ? ? ?Allergies   ?Azithromycin, Peanut-containing drug products, Iodinated contrast media, Other, Amoxicillin, Fish allergy, Gluten meal, Hydromet [hydrocodone bit-homatrop mbr], Ibuprofen, Lactose intolerance (gi), Morphine and related, and Pantoprazole ? ? ?Review of Systems ?Review of Systems ?Per HPI ? ?Physical Exam ?Triage Vital Signs ?ED Triage Vitals  ?Enc Vitals Group  ?   BP 06/04/21 1735 (!) 120/99  ?   Pulse Rate 06/04/21 1735 (!) 103  ?   Resp 06/04/21 1735 19  ?   Temp 06/04/21 1735 98.9 ?F (37.2 ?C)  ?   Temp Source 06/04/21 1735 Oral  ?   SpO2 06/04/21 1735 99 %  ?   Weight --   ?   Height --   ?   Head Circumference --   ?   Peak Flow --   ?   Pain Score 06/04/21 1738 10  ?   Pain Loc --   ?   Pain Edu? --   ?   Excl. in [place]? --   ? ?No data found. ? ?Updated Vital Signs ?BP (!) 120/99 (BP Location: Right Arm)   Pulse (!) 103   Temp 98.9 ?F (37.2 ?C) (Oral)   Resp 19   LMP 05/15/2021 (Approximate)   SpO2 99%   ? ?Visual Acuity ?Right Eye Distance:   ?Left Eye Distance:   ?Bilateral Distance:   ? ?Right Eye Near:   ?Left Eye Near:    ?Bilateral Near:    ? ?Physical Exam ?Vitals and nursing note reviewed.  ?Constitutional:   ?   General: She is not in acute distress. ?   Appearance: Normal appearance. She is  well-developed. She is not ill-appearing.  ?HENT:  ?   Head: Normocephalic and atraumatic.  ?   Right Ear: Tympanic membrane, ear canal and external ear normal.  ?   Left Ear: Tympanic membrane, ear canal and external ear normal.  ?   Nose: Nose normal.  ?   Mouth/Throat:  ?   Mouth: Mucous membranes are moist.  ?Eyes:  ?   General: No visual field deficit. ?   Extraocular Movements: Extraocular movements intact.  ?   Conjunctiva/sclera: Conjunctivae normal.  ?Cardiovascular:  ?   Rate and Rhythm: Normal rate and regular rhythm.  ?   Heart sounds: Normal heart sounds. No murmur heard. ?  No friction rub. No gallop.  ?Pulmonary:  ?   Effort: Pulmonary effort is normal. No respiratory distress.  ?   Breath sounds: Normal breath sounds. No wheezing, rhonchi or rales.  ?Chest:  ?   Chest wall: No tenderness.  ?Abdominal:  ?   Palpations: Abdomen is soft.  ?   Tenderness: There is no abdominal tenderness. There is no right CVA tenderness or left CVA tenderness.  ?Musculoskeletal:     ?   General: No swelling.  ?   Cervical back: Neck supple.  ?Skin: ?   General: Skin is warm and dry.  ?   Capillary Refill: Capillary refill takes less than 2 seconds.  ?   Findings: No rash.  ?Neurological:  ?   Mental Status: She is alert and oriented to person, place, and time.  ?   Cranial Nerves: Dysarthria present. No facial asymmetry.  ?   Sensory: Sensory deficit present.  ?   Motor: Motor function is intact. No weakness, tremor, seizure activity or pronator drift.  ?   Comments: Patient is unable to raise her eyebrows at this time.  She is also slurring her speech.  She is able to discern dull versus sharp, but states that it feels  "different" when provider touches the right side of her face versus the left side of her face and her right arm versus her left arm.  Negative pronator drift.  No seizure activity urgent care.  She walks with an an

## 2021-06-04 NOTE — ED Triage Notes (Addendum)
Pt BIB [facility] from urgent care for slurred speech. Pt was seen yesterday at [facility] for seizures. Pt showed up for foot pain at urgent care and was sent here for slurred speech. Per [facility] pt is acting like she could be intoxicated. Pt states this issues with her speech has been going on for 4 days. Pt talking clearly for a couple sentences and then will slur and mix up words.  ?

## 2021-06-05 ENCOUNTER — Ambulatory Visit (HOSPITAL_COMMUNITY)
Admission: EM | Admit: 2021-06-05 | Discharge: 2021-06-05 | Disposition: A | Discharge status: Home / Self Care | Payer: PPO

## 2021-06-05 DIAGNOSIS — M79671 Pain in right foot: Secondary | ICD-10-CM | POA: Diagnosis not present

## 2021-06-05 DIAGNOSIS — G40909 Epilepsy, unspecified, not intractable, without status epilepticus: Secondary | ICD-10-CM | POA: Diagnosis not present

## 2021-06-05 NOTE — Discharge Instructions (Addendum)
-  Please follow-up with [facility] or the surgeon who performed your surgery  ?

## 2021-06-05 NOTE — ED Provider Notes (Addendum)
[facility]    CSN: [id number] Arrival date & time: 06/05/21  1438      History   Chief Complaint Chief Complaint  Patient presents with   Follow-up   Foot Pain   Aphasia    HPI [name] is a 26 y.o. female presenting with right foot pain for 3 days following 10 bags of ice falling on it at a grocery store per pt.  Status post right foot fracture 2019, status post ORIF.  History extensive as below, including pulmonary embolism on long-term anticoagulation, seizure disorder on Keppra.  She actually had a seizure 5/13 on her way to the emergency department for evaluation for the foot pain.  She had apparently stopped the Keppra because she had not had any seizures recently.  She states that she has been taking it for the last 3 days, and has been seizure-free since that time, without headaches, dizziness, vision changes, weakness.  She states that she has gone to [facility] and the emergency department for the foot, x-rays were performed but "nobody has done anything".  She is specifically requesting a narcotic for the pain.  HPI  Past Medical History:  Diagnosis Date   Asthma    Celiac disease    Collagen vascular disease ([facility])    Diarrhea    Patient mentions diagnosis of ulcerative colitis but it is not clear she actually has UC   Endometriosis    Lupus ([facility])    Ovarian cyst    Panic disorder 05/30/2020   Pulmonary embolus ([facility]) 02/06/2020   Seizures ([facility])     Patient Active Problem List   Diagnosis Date Noted   Anxiety    Hematemesis 06/17/2020   MDD (major depressive disorder), recurrent severe, 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   Panic disorder 05/30/2020   Major depressive disorder 05/29/2020   Suicide attempt by acetaminophen overdose ([facility]) 05/29/2020   Obesity, Class III, BMI 40-49.9 (morbid obesity) ([facility]) 04/05/2020   SOB (shortness of breath) 04/05/2020   Abdominal pain 04/04/2020   SLE (systemic lupus erythematosus related syndrome)  (H[facility]) 04/04/2020   Shortness of breath 04/04/2020   Pneumonia 03/23/2020   Nausea 03/23/2020   Asthma 03/22/2020   Dysuria 02/03/2020   Hematuria 02/03/2020   Mass of urinary bladder 01/26/2020   Allergic rhinitis 05/05/2019   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   Endometriosis 03/14/2018   Long-term use of high-risk medication 07/02/2017   Lupus anticoagulant positive 07/02/2017   Personal history of pulmonary embolism 07/02/2017   Other forms of systemic lupus erythematosus ([facility]) 03/12/2017   Anemia 02/28/2017   Migraine 02/28/2017   Dysmenorrhea 08/28/2011   Gastritis 08/28/2011    Past Surgical History:  Procedure Laterality Date   APPENDECTOMY     CHOLECYSTECTOMY     ESOPHAGOGASTRODUODENOSCOPY (EGD) WITH PROPOFOL N/A 06/18/2020   Procedure: ESOPHAGOGASTRODUODENOSCOPY (EGD) WITH PROPOFOL;  Surgeon: Sherrilyn Rist, MD;  Location: [facility] ENDOSCOPY;  Service: Gastroenterology;  Laterality: N/A;   EXCISION OF ENDOMETRIOMA     Ovarian cyst removal   excision of endometriosis     FOOT FRACTURE SURGERY     w/ hardware   HERNIA REPAIR     TONSILLECTOMY      OB History   No obstetric history on file.      Home Medications    Prior to Admission medications   Medication Sig Start Date End Date Taking? Authorizing Provider  ALPRAZolam Prudy Feeler) 1 MG tablet Take 1.5 mg by mouth 2 (two) times daily. 02/13/21  [provider]  apixaban (ELIQUIS) 5 MG TABS tablet Take 5 mg by mouth 2 (two) times daily.    [provider]  chlorpheniramine-HYDROcodone (TUSSIONEX PENNKINETIC ER) 10-8 MG/5ML Take 5 mLs by mouth every 12 (twelve) hours as needed for cough. 05/23/21   Ward, Tylene Fantasia, PA-C  DULoxetine (CYMBALTA) 30 MG capsule Take 30 mg by mouth daily. 05/22/21   [provider]  EPINEPHrine 0.3 mg/0.3 mL IJ SOAJ injection Inject 0.3 mg into the muscle daily as needed (Allergic Reaction). 06/01/20   Armandina Stammer I, NP  hydroxychloroquine (PLAQUENIL) 200 MG tablet Take  2 tablets (400 mg total) by mouth daily. 03/23/20   Fuller Plan, MD  levETIRAcetam (KEPPRA) 500 MG tablet Take 1 tablet (500 mg total) by mouth 2 (two) times daily. 06/03/21   Jacalyn Lefevre, MD  methocarbamol (ROBAXIN) 500 MG tablet Take 1 tablet (500 mg total) by mouth 2 (two) times daily. 06/03/21   Jacalyn Lefevre, MD  zolpidem (AMBIEN) 10 MG tablet Take 20 mg by mouth at bedtime. 06/02/20   [provider]  dicyclomine (BENTYL) 20 MG tablet Take 1 tablet (20 mg total) by mouth 2 (two) times daily. 11/29/18 04/11/19  Muthersbaugh, Dahlia Client, PA-C  Fluticasone Propionate HFA (FLOVENT HFA IN) Inhale into the lungs. Patient not taking: Reported on 11/14/2019  11/14/19  [provider]    Family History Family History  Problem Relation Age of Onset   Hypertension Mother    Hypercholesterolemia Mother    Rheum arthritis Mother    Diabetes Father    Hypertension Father    Cancer Father    Autism Brother     Social History Social History   Tobacco Use   Smoking status: Never   Smokeless tobacco: Never  Vaping Use   Vaping Use: Never used  Substance Use Topics   Alcohol use: Never   Drug use: Never     Allergies   Azithromycin, Peanut-containing drug products, Iodinated contrast media, Other, Amoxicillin, Fish allergy, Gluten meal, Hydromet [hydrocodone bit-homatrop mbr], Ibuprofen, Lactose intolerance (gi), Morphine and related, and Pantoprazole   Review of Systems Review of Systems  Musculoskeletal:        Right foot pain   All other systems reviewed and are negative.   Physical Exam Triage Vital Signs ED Triage Vitals [06/05/21 1449]  Enc Vitals Group     BP (!) 139/104     Pulse Rate (!) 101     Resp 18     Temp 97.9 F (36.6 C)     Temp Source Oral     SpO2 97 %     Weight      Height      Head Circumference      Peak Flow      Pain Score 7     Pain Loc      Pain Edu?      Excl. in [place]?    No data found.  Updated Vital Signs BP (!)  139/104 (BP Location: Left Arm)   Pulse (!) 101   Temp 97.9 F (36.6 C) (Oral)   Resp 18   LMP 05/15/2021 (Approximate)   SpO2 97%   Visual Acuity Right Eye Distance:   Left Eye Distance:   Bilateral Distance:    Right Eye Near:   Left Eye Near:    Bilateral Near:     Physical Exam Vitals reviewed.  Constitutional:      General: She is not in acute distress.  Appearance: Normal appearance. She is not ill-appearing.  HENT:     Head: Normocephalic and atraumatic.  Eyes:     Extraocular Movements: Extraocular movements intact.     Pupils: Pupils are equal, round, and reactive to light.  Cardiovascular:     Rate and Rhythm: Normal rate and regular rhythm.     Heart sounds: Normal heart sounds.  Pulmonary:     Effort: Pulmonary effort is normal.     Breath sounds: Normal breath sounds. No wheezing, rhonchi or rales.  Musculoskeletal:     Cervical back: Normal range of motion and neck supple. No rigidity.     Comments: R foot -brace in place. Diffusely TTP without skin changes or swelling. Exam limited due to pain.  Lymphadenopathy:     Cervical: No cervical adenopathy.  Skin:    Capillary Refill: Capillary refill takes less than 2 seconds.  Neurological:     General: No focal deficit present.     Mental Status: She is alert and oriented to person, place, and time. Mental status is at baseline.     Cranial Nerves: No cranial nerve deficit or facial asymmetry.     Sensory: Sensation is intact. No sensory deficit.     Motor: Motor function is intact. No weakness.     Coordination: Coordination is intact. Coordination normal.     Gait: Gait is intact. Gait normal.     Comments: PERRLA, EOMI. CN 2-12 intact. No weakness or numbness in UEs or LEs.  Psychiatric:        Mood and Affect: Mood normal.        Behavior: Behavior normal.        Thought Content: Thought content normal.        Judgment: Judgment normal.     UC Treatments / Results  Labs (all labs ordered are  listed, but only abnormal results are displayed) Labs Reviewed - No data to display  EKG   Radiology CT HEAD WO CONTRAST ( )  Result Date: 06/04/2021 CLINICAL DATA:  Headache, new or worsening, neuro deficit (Age 20-49y); Neck trauma, focal neuro deficit or paresthesia (Age 7-64y) EXAM: CT HEAD WITHOUT CONTRAST CT CERVICAL SPINE WITHOUT CONTRAST TECHNIQUE: Multidetector CT imaging of the head and cervical spine was performed following the standard protocol without intravenous contrast. Multiplanar CT image reconstructions of the cervical spine were also generated. RADIATION DOSE REDUCTION: This exam was performed according to the departmental dose-optimization program which includes automated exposure control, adjustment of the mA and/or kV according to patient size and/or use of iterative reconstruction technique. COMPARISON:  CT dated May 12, 2019 FINDINGS: CT HEAD FINDINGS Brain: No evidence of acute infarction, hemorrhage, hydrocephalus, extra-axial collection or mass lesion/mass effect. Vascular: No hyperdense vessel or unexpected calcification. Skull: Normal. Negative for fracture or focal lesion. Sinuses/Orbits: No acute finding. Other: None. Evaluation is limited secondary to underpenetration. CT CERVICAL SPINE FINDINGS Alignment: Straightening of the cervical lordosis, likely positional. Skull base and vertebrae: Evaluation is limited secondary to underpenetration and quantum mottle. Within these limitations, no acute fracture. Soft tissues and spinal canal: No prevertebral fluid or swelling. No visible canal hematoma. Disc levels:  No significant degenerative changes. Upper chest: Negative. Other: None IMPRESSION: 1.  No acute intracranial abnormality. 2.  No acute fracture or static subluxation of the cervical spine. Electronically Signed   By: Meda Klinefelter M.D.   On: 06/04/2021 19:38   CT Cervical Spine Wo Contrast  Result Date: 06/04/2021 CLINICAL DATA:  Headache, new or  worsening, neuro deficit (Age 81-49y); Neck trauma, focal neuro deficit or paresthesia (Age 43-64y) EXAM: CT HEAD WITHOUT CONTRAST CT CERVICAL SPINE WITHOUT CONTRAST TECHNIQUE: Multidetector CT imaging of the head and cervical spine was performed following the standard protocol without intravenous contrast. Multiplanar CT image reconstructions of the cervical spine were also generated. RADIATION DOSE REDUCTION: This exam was performed according to the departmental dose-optimization program which includes automated exposure control, adjustment of the mA and/or kV according to patient size and/or use of iterative reconstruction technique. COMPARISON:  CT dated May 12, 2019 FINDINGS: CT HEAD FINDINGS Brain: No evidence of acute infarction, hemorrhage, hydrocephalus, extra-axial collection or mass lesion/mass effect. Vascular: No hyperdense vessel or unexpected calcification. Skull: Normal. Negative for fracture or focal lesion. Sinuses/Orbits: No acute finding. Other: None. Evaluation is limited secondary to underpenetration. CT CERVICAL SPINE FINDINGS Alignment: Straightening of the cervical lordosis, likely positional. Skull base and vertebrae: Evaluation is limited secondary to underpenetration and quantum mottle. Within these limitations, no acute fracture. Soft tissues and spinal canal: No prevertebral fluid or swelling. No visible canal hematoma. Disc levels:  No significant degenerative changes. Upper chest: Negative. Other: None IMPRESSION: 1.  No acute intracranial abnormality. 2.  No acute fracture or static subluxation of the cervical spine. Electronically Signed   By: Meda Klinefelter M.D.   On: 06/04/2021 19:38    Procedures Procedures (including critical care time)  Medications Ordered in UC Medications - No data to display  Initial Impression / Assessment and Plan / UC Course  I have reviewed the triage vital signs and the nursing notes.  Pertinent labs & imaging results that were  available during my care of the patient were reviewed by me and considered in my medical decision making (see chart for details).     This patient is a very pleasant 26 y.o. year old female presenting with R foot pain x3 days following bags of ice falling on the foot per pt. Xray performed 06/03/21 in the ED was negative for fracture, and apparently she also saw [facility] on the day of the trauma; I do not have access to these records. Denies new trauma since the injury. She is specifically requesting a narcotic for the pain. Her PDMP is 500, and I am not comfortable prescribing additional narcotic at this time. She verbalizes understanding. Rec f/u with [facility] or PCP.  For seizure disorder - she is neurologically intact today, and is taking keppra again. No seizure in the last 3 days.  Final Clinical Impressions(s) / UC Diagnoses   Final diagnoses:  Right foot pain  Seizure disorder [facility])     Discharge Instructions      -Please follow-up with [facility] or the surgeon who performed your surgery    ED Prescriptions   None    I have reviewed the PDMP during this encounter.   Rhys Martini, PA-C 06/05/21 1712    Rhys Martini, PA-C 06/05/21 1713

## 2021-06-05 NOTE — ED Triage Notes (Signed)
Pt reports being seen yesterday for slurred speech and right foot pain. She was sent by EMS for slurred speech.  Patient reports leaving the ED due to wait time and her foot pain was never address before leaving AMA.  ?Pt reports feeling better but would like foot pain address.  ?

## 2021-06-06 ENCOUNTER — Encounter (HOSPITAL_BASED_OUTPATIENT_CLINIC_OR_DEPARTMENT_OTHER): Payer: Self-pay

## 2021-06-06 ENCOUNTER — Other Ambulatory Visit: Payer: Self-pay

## 2021-06-06 ENCOUNTER — Emergency Department (HOSPITAL_BASED_OUTPATIENT_CLINIC_OR_DEPARTMENT_OTHER)
Admission: EM | Admit: 2021-06-06 | Discharge: 2021-06-06 | Disposition: A | Discharge status: Home / Self Care | Payer: PPO | Attending: Emergency Medicine | Admitting: Emergency Medicine

## 2021-06-06 DIAGNOSIS — Z91010 Allergy to peanuts: Secondary | ICD-10-CM | POA: Insufficient documentation

## 2021-06-06 DIAGNOSIS — R4701 Aphasia: Secondary | ICD-10-CM | POA: Insufficient documentation

## 2021-06-06 DIAGNOSIS — Z7901 Long term (current) use of anticoagulants: Secondary | ICD-10-CM | POA: Diagnosis not present

## 2021-06-06 DIAGNOSIS — M79671 Pain in right foot: Secondary | ICD-10-CM | POA: Insufficient documentation

## 2021-06-06 NOTE — ED Notes (Signed)
Pt verbalizes understanding of discharge instructions. Opportunity for questioning and answers were provided. Pt discharged from ED to home.   ? ?
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
## 2021-06-06 NOTE — ED Triage Notes (Addendum)
Reports ongoing right foot pain after an incident with several bags of ice recently. Multiple visits this week for same.  ? ?Pt wearing CAM boot on injured extremity.  ?

## 2021-06-24 ENCOUNTER — Emergency Department (HOSPITAL_COMMUNITY)
Admission: EM | Admit: 2021-06-24 | Discharge: 2021-06-25 | Disposition: A | Discharge status: Home / Self Care | Payer: PPO | Attending: Emergency Medicine | Admitting: Emergency Medicine

## 2021-06-24 ENCOUNTER — Encounter (HOSPITAL_COMMUNITY): Payer: Self-pay

## 2021-06-24 ENCOUNTER — Emergency Department (HOSPITAL_COMMUNITY): Payer: PPO

## 2021-06-24 ENCOUNTER — Other Ambulatory Visit: Payer: Self-pay

## 2021-06-24 DIAGNOSIS — R112 Nausea with vomiting, unspecified: Secondary | ICD-10-CM | POA: Insufficient documentation

## 2021-06-24 DIAGNOSIS — D649 Anemia, unspecified: Secondary | ICD-10-CM | POA: Insufficient documentation

## 2021-06-24 DIAGNOSIS — Z7901 Long term (current) use of anticoagulants: Secondary | ICD-10-CM | POA: Insufficient documentation

## 2021-06-24 DIAGNOSIS — Z91010 Allergy to peanuts: Secondary | ICD-10-CM | POA: Diagnosis not present

## 2021-06-24 DIAGNOSIS — T391X1A Poisoning by 4-Aminophenol derivatives, accidental (unintentional), initial encounter: Secondary | ICD-10-CM | POA: Diagnosis present

## 2021-06-24 DIAGNOSIS — R000 Tachycardia, unspecified: Secondary | ICD-10-CM | POA: Insufficient documentation

## 2021-06-24 DIAGNOSIS — R1084 Generalized abdominal pain: Secondary | ICD-10-CM | POA: Diagnosis not present

## 2021-06-24 LAB — COMPREHENSIVE METABOLIC PANEL
ALT: 11 U/L (ref 0–44)
ALT: 11 U/L (ref 0–44)
AST: 17 U/L (ref 15–41)
AST: 16 U/L (ref 15–41)
Albumin: 4 g/dL (ref 3.5–5.0)
Albumin: 3.4 g/dL — ABNORMAL LOW (ref 3.5–5.0)
Alkaline Phosphatase: 73 U/L (ref 38–126)
Alkaline Phosphatase: 67 U/L (ref 38–126)
Anion gap: 8 (ref 5–15)
Anion gap: 10 (ref 5–15)
BUN: 7 mg/dL (ref 6–20)
BUN: 6 mg/dL (ref 6–20)
CO2: 16 mmol/L — ABNORMAL LOW (ref 22–32)
CO2: 19 mmol/L — ABNORMAL LOW (ref 22–32)
Calcium: 8.9 mg/dL (ref 8.9–10.3)
Calcium: 9 mg/dL (ref 8.9–10.3)
Chloride: 112 mmol/L — ABNORMAL HIGH (ref 98–111)
Chloride: 111 mmol/L (ref 98–111)
Creatinine, Ser: 1.02 mg/dL — ABNORMAL HIGH (ref 0.44–1.00)
Creatinine, Ser: 0.89 mg/dL (ref 0.44–1.00)
GFR, Estimated: >60 mL/min (ref >60)
GFR, Estimated: >60 mL/min (ref >60)
Glucose, Bld: 97 mg/dL (ref 70–99)
Glucose, Bld: 107 mg/dL — ABNORMAL HIGH (ref 70–99)
Potassium: 3.5 mmol/L (ref 3.5–5.1)
Potassium: 3.4 mmol/L — ABNORMAL LOW (ref 3.5–5.1)
Sodium: 136 mmol/L (ref 135–145)
Sodium: 140 mmol/L (ref 135–145)
Total Bilirubin: 0.3 mg/dL (ref 0.3–1.2)
Total Bilirubin: 0.4 mg/dL (ref 0.3–1.2)
Total Protein: 6.7 g/dL (ref 6.5–8.1)
Total Protein: 6.3 g/dL — ABNORMAL LOW (ref 6.5–8.1)

## 2021-06-24 LAB — RAPID URINE DRUG SCREEN, HOSP PERFORMED
Amphetamines: NOT DETECTED
Barbiturates: NOT DETECTED
Benzodiazepines: POSITIVE — AB
Cocaine: NOT DETECTED
Opiates: NOT DETECTED
Tetrahydrocannabinol: NOT DETECTED

## 2021-06-24 LAB — URINALYSIS, ROUTINE W REFLEX MICROSCOPIC
Bilirubin Urine: NEGATIVE
Glucose, UA: NEGATIVE mg/dL
Hgb urine dipstick: NEGATIVE
Ketones, ur: NEGATIVE mg/dL
Leukocytes,Ua: NEGATIVE
Nitrite: NEGATIVE
Protein, ur: 30 mg/dL — AB
Specific Gravity, Urine: 1.028 (ref 1.005–1.030)
pH: 5 (ref 5.0–8.0)

## 2021-06-24 LAB — I-STAT BETA HCG BLOOD, ED (MC, WL, AP ONLY): I-stat hCG, quantitative: <5 m[IU]/mL (ref <5)

## 2021-06-24 LAB — CBC
HCT: 37.3 % (ref 36.0–46.0)
Hemoglobin: 11.9 g/dL — ABNORMAL LOW (ref 12.0–15.0)
MCH: 24.2 pg — ABNORMAL LOW (ref 26.0–34.0)
MCHC: 31.9 g/dL (ref 30.0–36.0)
MCV: 75.8 fL — ABNORMAL LOW (ref 80.0–100.0)
Platelets: 400 10*3/uL (ref 150–400)
RBC: 4.92 MIL/uL (ref 3.87–5.11)
RDW: 17.7 % — ABNORMAL HIGH (ref 11.5–15.5)
WBC: 6.9 10*3/uL (ref 4.0–10.5)
nRBC: 0 % (ref 0.0–0.2)

## 2021-06-24 LAB — ACETAMINOPHEN LEVEL
Acetaminophen (Tylenol), Serum: 44 ug/mL — ABNORMAL HIGH (ref 10–30)
Acetaminophen (Tylenol), Serum: <10 ug/mL — ABNORMAL LOW (ref 10–30)

## 2021-06-24 LAB — SALICYLATE LEVEL: Salicylate Lvl: <7 mg/dL — ABNORMAL LOW (ref 7.0–30.0)

## 2021-06-24 LAB — ETHANOL: Alcohol, Ethyl (B): <10 mg/dL (ref <10)

## 2021-06-24 MED ORDER — MORPHINE SULFATE (PF) 4 MG/ML IV SOLN
4.0000 mg | Freq: Once | INTRAVENOUS | Status: AC
  Administered 2021-06-24: 4 mg via INTRAVENOUS
  Filled 2021-06-24 (×2): qty 1

## 2021-06-24 MED ORDER — ONDANSETRON HCL 4 MG/2ML IJ SOLN
4.0000 mg | Freq: Once | INTRAMUSCULAR | Status: AC
Start: 2021-06-24 — End: 2021-06-24
  Administered 2021-06-24: 4 mg via INTRAVENOUS
  Filled 2021-06-24: qty 2

## 2021-06-24 MED ORDER — HYDROMORPHONE HCL 1 MG/ML IJ SOLN
0.5000 mg | Freq: Once | INTRAMUSCULAR | Status: AC
  Administered 2021-06-24: 0.5 mg via INTRAVENOUS
  Filled 2021-06-24: qty 1

## 2021-06-24 MED ORDER — ONDANSETRON HCL 4 MG/2ML IJ SOLN
4.0000 mg | Freq: Once | INTRAMUSCULAR | Status: AC
Start: 2021-06-24 — End: 2021-06-24
  Administered 2021-06-24: 4 mg via INTRAVENOUS
  Filled 2021-06-24 (×2): qty 2

## 2021-06-24 MED ORDER — DIPHENHYDRAMINE HCL 50 MG/ML IJ SOLN
25.0000 mg | Freq: Once | INTRAMUSCULAR | Status: AC
Start: 2021-06-24 — End: 2021-06-24
  Administered 2021-06-24: 25 mg via INTRAVENOUS
  Filled 2021-06-24 (×2): qty 1

## 2021-06-24 MED ORDER — DEXTROSE 5 % IV SOLN
1500.0000 mg/h | INTRAVENOUS | Status: DC
  Administered 2021-06-24: 1500 mg/h via INTRAVENOUS
  Filled 2021-06-24: qty 90

## 2021-06-24 MED ORDER — ACETYLCYSTEINE LOAD VIA INFUSION
15000.0000 mg | Freq: Once | INTRAVENOUS | Status: AC
  Administered 2021-06-24: 15000 mg via INTRAVENOUS
  Filled 2021-06-24: qty 492

## 2021-06-24 MED ORDER — DIPHENHYDRAMINE HCL 50 MG/ML IJ SOLN
25.0000 mg | Freq: Once | INTRAMUSCULAR | Status: AC
  Administered 2021-06-24: 25 mg via INTRAVENOUS
  Filled 2021-06-24: qty 1

## 2021-06-24 MED ORDER — PANTOPRAZOLE SODIUM 20 MG PO TBEC: 20.0000 mg | DELAYED_RELEASE_TABLET | Freq: Every day | ORAL | 0 refills | Status: DC

## 2021-06-24 NOTE — ED Triage Notes (Signed)
Pt came in via POV with initial c/o kidney infection/UTI that she took a large amt of Tylenol for her abd pain symptoms today. Pt called poison control & they recommended she get treated for Tylenol toxicity. Pt reports taking 5 or 6 times the recommended daily dosage of Tylenol. Pt also reports vomiting & seeing red streaks in her emesis.

## 2021-06-24 NOTE — Discharge Instructions (Addendum)
I would recommend following up with GI at your earliest convenience to discuss your abdominal pain symptoms, and follow-up regarding the possible diagnosis of ulcerative colitis, or IBD.  Please refrain from taking Tylenol, ibuprofen inappropriately.  You can only take 1000 mg of Tylenol up to every 6 hours, for a max of 4000 mg daily.  I prescribed a new medication that can help with possible ulcer formation, stomach upset.  Please return to the emergency department if your symptoms worsen, fail to improve, you begin to have thoughts of hurting yourself, hurting other people.  I have attached some psychiatric resources to your discharge information.
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
## 2021-06-24 NOTE — Progress Notes (Addendum)
Poison Control Consult Documentation  [facility] has been contacted by request of of the ED care team for concern for need for acetylcysteine for acetaminophen overdose.  Patient taking a large amountof Tylenol for her abdominal symptoms today. Patient called [facility] at 1655 reporting taking too much ibuprofen over the previous 2 hours. Upon [facility] questioning strength of tablets, patient states took 566m tablets. Upon questioning this (not available this way), patient said "I meant tylenol not ibuprofen".  Pt endorses vomiting & seeing red streaks in her emesis.  Labs: Tylenol level 44; Salicylate level ND; Ethanol ND CO2 16 on CMP LFTS wnl  Summary  of [facility] Recommendations: [place] [facility] and [place] Acetaminophen level is not above treatment level if outlined story above is correct. CO2 finding is consistent with acetaminophen overdose but could be explained by multiple factors as well.   [facility] with [facility] and [facility] level 4 hours from previous labs and calling back. This was relayed to EDP. EDP is concerned that patient's story is incorrect and would rather treat in the event the patient is not being forthcoming with story given history of overdose.  Acetylcysteine protocol initiated. [place] and agreeable with plan. Still recommends repeating CMP and APAP level at 2230.  Discontinuation parameters (all must be true): Patient asymptomatic at 24 hours with normal AST/ALT, INR < 2 (if checked), and documented undetectable acetaminophen level  Recommendations given to provider.  JLorelei Pont PharmD, BCPS Emergency Medicine Clinical Pharmacist 06/24/2021 9:02 PM

## 2021-06-25 ENCOUNTER — Telehealth: Payer: PPO | Admitting: Nurse Practitioner

## 2021-06-25 DIAGNOSIS — N83209 Unspecified ovarian cyst, unspecified side: Secondary | ICD-10-CM | POA: Diagnosis not present

## 2021-06-25 NOTE — Progress Notes (Signed)
Virtual Visit Consent   [name], you are scheduled for a virtual visit with [name], [place], a [facility] provider, today.     Just as with appointments in the office, your consent must be obtained to participate.  Your consent will be active for this visit and any virtual visit you may have with one of our providers in the next 365 days.     If you have a MyChart account, a copy of this consent can be sent to you electronically.  All virtual visits are billed to your insurance company just like a traditional visit in the office.    As this is a virtual visit, video technology does not allow for your provider to perform a traditional examination.  This may limit your provider's ability to fully assess your condition.  If your provider identifies any concerns that need to be evaluated in person or the need to arrange testing (such as labs, EKG, etc.), we will make arrangements to do so.     Although advances in technology are sophisticated, we cannot ensure that it will always work on either your end or our end.  If the connection with a video visit is poor, the visit may have to be switched to a telephone visit.  With either a video or telephone visit, we are not always able to ensure that we have a secure connection.     I need to obtain your verbal consent now.   Are you willing to proceed with your visit today? YES   [name] has provided verbal consent on 06/25/2021 for a virtual visit (video or telephone).   Cassandra Hassell Done, FNP   Date: 06/25/2021 10:31 AM   Virtual Visit via Video Note   I, [name], connected with [name] ([id number], 1995-12-31) on 06/25/21 at 10:30 AM EDT by a video-enabled telemedicine application and verified that I am speaking with the correct person using two identifiers.  Location: Patient: Virtual Visit Location Patient: Home Provider: Virtual Visit Location Provider: Mobile   I discussed the  limitations of evaluation and management by telemedicine and the availability of in person appointments. The patient expressed understanding and agreed to proceed.    History of Present Illness: [name] is a 26 y.o. who identifies as a female who was assigned female at birth, and is being seen today for ovarian cyst.  HPI: Patient  went to the urgent care yesterday with pelvic pain. She was dx with a large ovarian cyst and was discharged home on no meds. She contacted her GYN, who is located in [place].  She has had cyst removed from ovaries on 2 other occasions. Her doctor in [place] told her he was concerned about the size of the cyst. Rate [place] 7-8/10 at times.   Review of Systems  Constitutional: Negative.   Gastrointestinal:  Positive for abdominal pain.   Problems:  Patient Active Problem List   Diagnosis Date Noted   Anxiety    Hematemesis 06/17/2020   MDD (major depressive disorder), recurrent severe, without psychosis ([place]) 05/30/2020   Panic disorder 05/30/2020   Major depressive disorder 05/29/2020   Suicide attempt by acetaminophen overdose (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   Endometriosis 03/14/2018   Long-term use of high-risk medication 07/02/2017   Lupus anticoagulant positive 07/02/2017   Personal history of pulmonary embolism 07/02/2017   Other forms of systemic lupus erythematosus ([place]) 03/12/2017   Anemia 02/28/2017   Migraine 02/28/2017   Dysmenorrhea 08/28/2011   Gastritis 08/28/2011    Allergies:  Allergies   Allergen Reactions   Azithromycin Anaphylaxis   Peanut-Containing Drug Products Other (See Comments)    Unknown ; treenuts, shell fish Unknown reaction, took allergy test   Iodinated Contrast Media Hives and Itching    02/04/2020 Patient stated has history of itching and hives with CT IV Iodine contrast, premedicated with Benadryl 25 mg IV prior to CT IV Iodine scan today.Post CT scan patient reported itching, Benadryl 25 mg IV given, symptoms resolved, NP recommends for patient to get premedicated with Benadryl 50 mg IV prior to future CT IV Iodine scans.    Other Other (See Comments)    Moderna covid vaccine -Syncope    Amoxicillin Swelling    Hand swelling    Fish Allergy Other (See Comments)    Unknown Allergy test    Gluten Meal Other (See Comments)    Celiac disease   Hydromet [Hydrocodone Bit-Homatrop Mbr] Nausea And Vomiting   Ibuprofen Other (See Comments)    Currently taking Eliquis   Lactose Intolerance (Gi) Nausea And Vomiting   Morphine And Related Itching    Can be taken with Benadryl   Pantoprazole Other (See Comments)    Stomach pain   Medications:  Current Outpatient Medications:    ALPRAZolam (XANAX) 1 MG tablet, Take 1.5 mg by mouth 2 (two) times daily., Disp: , Rfl:    apixaban (ELIQUIS) 5 MG TABS tablet, Take 5 mg by mouth 2 (two) times daily., Disp: , Rfl:    chlorpheniramine-HYDROcodone (TUSSIONEX PENNKINETIC ER) 10-8 MG/5ML, Take 5 mLs by mouth every 12 (twelve) hours as needed for cough., Disp: 140 mL, Rfl: 0   DULoxetine (CYMBALTA) 30 MG capsule, Take 30 mg by mouth daily., Disp: , Rfl:    EPINEPHrine 0.3 mg/0.3 mL IJ SOAJ injection, Inject 0.3 mg into the muscle daily as needed (Allergic Reaction)., Disp: 1 each, Rfl: 0   hydroxychloroquine (PLAQUENIL) 200 MG tablet, Take 2 tablets (400 mg total) by mouth daily., Disp: 180 tablet, Rfl: 0   levETIRAcetam (KEPPRA) 500 MG tablet, Take 1 tablet (500 mg total) by mouth 2 (two) times daily., Disp: 60 tablet,  Rfl: 0   methocarbamol (ROBAXIN) 500 MG tablet, Take 1 tablet (500 mg total) by mouth 2 (two) times daily., Disp: 20 tablet, Rfl: 0   pantoprazole (PROTONIX) 20 MG tablet, Take 1 tablet (20 mg total) by mouth daily., Disp: 30 tablet, Rfl: 0   zolpidem (AMBIEN) 10 MG tablet, Take 20 mg by mouth at bedtime., Disp: , Rfl:   Observations/Objective: Patient is well-developed, well-nourished in no acute distress.  Resting comfortably  at home.  Head is normocephalic, atraumatic.  No labored breathing.  Speech is clear and coherent with logical content.  Patient is alert and oriented at baseline.    Assessment and Plan:  [name] in today with chief complaint of Ovarian Cyst   1. Cyst of ovary, unspecified laterality Need to call a GYN office tomorrow morning to make an appointment Moist heat to abdomen Tylenol OTC- cannot  take NSAIDS because she is on a blood thinner     Follow Up Instructions: I discussed the assessment and treatment plan with the patient. The patient was provided an opportunity to ask questions and all were answered. The patient agreed with the plan and demonstrated an understanding of the instructions.  A copy of instructions were sent to the patient via MyChart.  The patient was advised to call back or seek an in-person evaluation if the symptoms worsen or if the condition fails to improve as anticipated.  Time:  I spent 10 minutes with the patient via telehealth technology discussing the above problems/concerns.    Cassandra Hassell Done, FNP

## 2021-06-25 NOTE — Patient Instructions (Signed)

## 2021-06-26 ENCOUNTER — Emergency Department (HOSPITAL_COMMUNITY)
Admission: EM | Admit: 2021-06-26 | Discharge: 2021-06-26 | Disposition: A | Discharge status: Home / Self Care | Payer: PPO | Attending: Emergency Medicine | Admitting: Emergency Medicine

## 2021-06-26 ENCOUNTER — Emergency Department (HOSPITAL_COMMUNITY): Payer: PPO

## 2021-06-26 ENCOUNTER — Encounter (HOSPITAL_COMMUNITY): Payer: Self-pay

## 2021-06-26 DIAGNOSIS — Z7901 Long term (current) use of anticoagulants: Secondary | ICD-10-CM | POA: Diagnosis not present

## 2021-06-26 DIAGNOSIS — Z79899 Other long term (current) drug therapy: Secondary | ICD-10-CM | POA: Diagnosis not present

## 2021-06-26 DIAGNOSIS — R1032 Left lower quadrant pain: Secondary | ICD-10-CM | POA: Diagnosis present

## 2021-06-26 DIAGNOSIS — J45909 Unspecified asthma, uncomplicated: Secondary | ICD-10-CM | POA: Diagnosis not present

## 2021-06-26 DIAGNOSIS — N83202 Unspecified ovarian cyst, left side: Secondary | ICD-10-CM | POA: Diagnosis not present

## 2021-06-26 LAB — CBC WITH DIFFERENTIAL/PLATELET
Abs Immature Granulocytes: 0.01 10*3/uL (ref 0.00–0.07)
Basophils Absolute: 0 10*3/uL (ref 0.0–0.1)
Basophils Relative: 0 %
Eosinophils Absolute: 0.5 10*3/uL (ref 0.0–0.5)
Eosinophils Relative: 7 %
HCT: 35 % — ABNORMAL LOW (ref 36.0–46.0)
Hemoglobin: 11.5 g/dL — ABNORMAL LOW (ref 12.0–15.0)
Immature Granulocytes: 0 %
Lymphocytes Relative: 40 %
Lymphs Abs: 3.1 10*3/uL (ref 0.7–4.0)
MCH: 24.4 pg — ABNORMAL LOW (ref 26.0–34.0)
MCHC: 32.9 g/dL (ref 30.0–36.0)
MCV: 74.2 fL — ABNORMAL LOW (ref 80.0–100.0)
Monocytes Absolute: 0.4 10*3/uL (ref 0.1–1.0)
Monocytes Relative: 5 %
Neutro Abs: 3.7 10*3/uL (ref 1.7–7.7)
Neutrophils Relative %: 48 %
Platelets: 377 10*3/uL (ref 150–400)
RBC: 4.72 MIL/uL (ref 3.87–5.11)
RDW: 17.3 % — ABNORMAL HIGH (ref 11.5–15.5)
WBC: 7.8 10*3/uL (ref 4.0–10.5)
nRBC: 0 % (ref 0.0–0.2)

## 2021-06-26 LAB — BASIC METABOLIC PANEL
Anion gap: 8 (ref 5–15)
BUN: 8 mg/dL (ref 6–20)
CO2: 20 mmol/L — ABNORMAL LOW (ref 22–32)
Calcium: 9.3 mg/dL (ref 8.9–10.3)
Chloride: 114 mmol/L — ABNORMAL HIGH (ref 98–111)
Creatinine, Ser: 0.95 mg/dL (ref 0.44–1.00)
GFR, Estimated: >60 mL/min (ref >60)
Glucose, Bld: 108 mg/dL — ABNORMAL HIGH (ref 70–99)
Potassium: 3.3 mmol/L — ABNORMAL LOW (ref 3.5–5.1)
Sodium: 142 mmol/L (ref 135–145)

## 2021-06-26 LAB — I-STAT BETA HCG BLOOD, ED (MC, WL, AP ONLY): I-stat hCG, quantitative: <5 m[IU]/mL (ref <5)

## 2021-06-26 MED ORDER — ONDANSETRON 4 MG PO TBDP: 4.0000 mg | ORAL_TABLET | Freq: Three times a day (TID) | ORAL | 0 refills | Status: DC | PRN

## 2021-06-26 MED ORDER — SODIUM CHLORIDE 0.9 % IV BOLUS
1000.0000 mL | Freq: Once | INTRAVENOUS | Status: AC
  Administered 2021-06-26: 1000 mL via INTRAVENOUS

## 2021-06-26 MED ORDER — HYDROMORPHONE HCL 1 MG/ML IJ SOLN
0.5000 mg | INTRAMUSCULAR | Status: DC | PRN
  Administered 2021-06-26: 0.5 mg via INTRAVENOUS
  Filled 2021-06-26: qty 1

## 2021-06-26 MED ORDER — DIPHENHYDRAMINE HCL 50 MG/ML IJ SOLN
25.0000 mg | Freq: Once | INTRAMUSCULAR | Status: AC
  Administered 2021-06-26: 25 mg via INTRAVENOUS

## 2021-06-26 MED ORDER — OXYCODONE HCL 5 MG PO TABS: 5.0000 mg | ORAL_TABLET | ORAL | 0 refills | Status: DC | PRN

## 2021-06-26 MED ORDER — POTASSIUM CHLORIDE CRYS ER 20 MEQ PO TBCR
40.0000 meq | EXTENDED_RELEASE_TABLET | Freq: Once | ORAL | Status: AC
  Administered 2021-06-26: 40 meq via ORAL

## 2021-06-26 MED ORDER — POTASSIUM CHLORIDE CRYS ER 20 MEQ PO TBCR
EXTENDED_RELEASE_TABLET | ORAL | Status: AC
  Filled 2021-06-26: qty 2

## 2021-06-26 MED ORDER — DIPHENHYDRAMINE HCL 50 MG/ML IJ SOLN
INTRAMUSCULAR | Status: AC
  Filled 2021-06-26: qty 1

## 2021-06-26 MED ORDER — ONDANSETRON HCL 4 MG/2ML IJ SOLN
4.0000 mg | Freq: Once | INTRAMUSCULAR | Status: AC
  Administered 2021-06-26: 4 mg via INTRAVENOUS
  Filled 2021-06-26: qty 2

## 2021-06-26 NOTE — ED Notes (Signed)
Transported to u/s

## 2021-06-26 NOTE — ED Triage Notes (Signed)
Pt has known ovarian cyst, c/o of L sided abd pain, pt has also been vomiting blood today and feeling dizzy

## 2021-06-26 NOTE — Discharge Instructions (Addendum)
Follow up with GYN tomorrow as scheduled. Take Zofran as needed for nausea and vomiting as prescribed. Take oxycodone as needed for pain as prescribed.    You can make an appointment to see a GYN provider:   [facility] at [place]  [phone]   [facility] [place] [facility]  [place]  [phone]   [facility] at [place]  [phone]   [facility] [place] [facility]  [place]  ([place] for [employer] at [place]  [phone]   If you already have an established OB/GYN provider in the area you can make an appointment with them as well.
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
## 2021-06-26 NOTE — ED Provider Triage Note (Signed)
Emergency Medicine Provider Triage Evaluation Note  [name] , a 26 y.o. female  was evaluated in triage.  Pt complains of complex ovarian cyst.seen last night for the same. The patient states that every time she has had a complex that she has had to have it surgically excised.  She states that she talked with her OB/GYN today who is out of town that told her to come here immediately.  Review of Systems  Positive: Pelvic pain Negative: Fever  Physical Exam  BP 103/61   Pulse (!) 115   Temp 98.6 F (37 C)   Resp 16   SpO2 97%  Gen:   Awake, no distress   Resp:  Normal effort  MSK:   Moves extremities without difficulty  Other:    Medical Decision Making  Medically screening exam initiated at 1:59 AM.  Appropriate orders placed.  Neftaly Ndia Sampath was informed that the remainder of the evaluation will be completed by another provider, this initial triage assessment does not replace that evaluation, and the importance of remaining in the ED until their evaluation is complete.  Work up pending.   Margarita Mail, PA-C 06/26/21 0206

## 2021-06-26 NOTE — ED Notes (Signed)
Patient worried about cyst rupturing. States "im not worried about my blood levels im worried if my cyst ruptures I could quite literally die. I hoe they know that." Patient reassured her labs were back and as soon as a room became available she would be placed into a room.

## 2021-06-26 NOTE — ED Notes (Signed)
Patient states she is still throwing up blood. Triage notified.

## 2021-06-27 ENCOUNTER — Encounter: Payer: PPO | Admitting: Obstetrics and Gynecology

## 2021-06-28 ENCOUNTER — Encounter: Payer: PPO | Admitting: Obstetrics and Gynecology

## 2021-06-29 ENCOUNTER — Encounter: Payer: Self-pay | Admitting: Obstetrics and Gynecology

## 2021-06-29 ENCOUNTER — Ambulatory Visit (INDEPENDENT_AMBULATORY_CARE_PROVIDER_SITE_OTHER): Payer: PPO | Admitting: Obstetrics and Gynecology

## 2021-06-29 VITALS — BP 114/82 | HR 104 | Ht 61.5 in | Wt 265.0 lb

## 2021-06-29 DIAGNOSIS — R102 Pelvic and perineal pain unspecified side: Secondary | ICD-10-CM

## 2021-06-29 DIAGNOSIS — M329 Systemic lupus erythematosus, unspecified: Secondary | ICD-10-CM | POA: Diagnosis not present

## 2021-06-29 DIAGNOSIS — Z86711 Personal history of pulmonary embolism: Secondary | ICD-10-CM | POA: Diagnosis not present

## 2021-06-29 DIAGNOSIS — N83202 Unspecified ovarian cyst, left side: Secondary | ICD-10-CM

## 2021-06-29 DIAGNOSIS — Z8742 Personal history of other diseases of the female genital tract: Secondary | ICD-10-CM

## 2021-06-29 DIAGNOSIS — Z91410 Personal history of adult physical and sexual abuse: Secondary | ICD-10-CM

## 2021-06-29 DIAGNOSIS — N942 Vaginismus: Secondary | ICD-10-CM

## 2021-06-29 MED ORDER — OXYCODONE HCL 5 MG PO CAPS: 5.0000 mg | ORAL_CAPSULE | Freq: Four times a day (QID) | ORAL | 0 refills | Status: DC | PRN
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
## 2021-06-29 NOTE — Patient Instructions (Signed)
Counselor: Rogene Houston [phone]

## 2021-06-30 ENCOUNTER — Emergency Department (HOSPITAL_COMMUNITY): Payer: PPO

## 2021-06-30 ENCOUNTER — Emergency Department (HOSPITAL_COMMUNITY)
Admission: EM | Admit: 2021-06-30 | Discharge: 2021-06-30 | Disposition: A | Discharge status: Home / Self Care | Payer: PPO | Attending: Emergency Medicine | Admitting: Emergency Medicine

## 2021-06-30 ENCOUNTER — Encounter (HOSPITAL_COMMUNITY): Payer: Self-pay

## 2021-06-30 ENCOUNTER — Telehealth: Payer: Self-pay | Admitting: *Deleted

## 2021-06-30 DIAGNOSIS — Z91010 Allergy to peanuts: Secondary | ICD-10-CM | POA: Insufficient documentation

## 2021-06-30 DIAGNOSIS — N83202 Unspecified ovarian cyst, left side: Secondary | ICD-10-CM | POA: Insufficient documentation

## 2021-06-30 DIAGNOSIS — Z7901 Long term (current) use of anticoagulants: Secondary | ICD-10-CM | POA: Diagnosis not present

## 2021-06-30 DIAGNOSIS — R102 Pelvic and perineal pain: Secondary | ICD-10-CM | POA: Diagnosis present

## 2021-06-30 MED ORDER — HYDROMORPHONE HCL 1 MG/ML IJ SOLN
1.0000 mg | Freq: Once | INTRAMUSCULAR | Status: AC
  Administered 2021-06-30: 1 mg via INTRAMUSCULAR
  Filled 2021-06-30: qty 1

## 2021-06-30 MED ORDER — DIPHENHYDRAMINE HCL 50 MG/ML IJ SOLN
50.0000 mg | Freq: Once | INTRAMUSCULAR | Status: AC
  Administered 2021-06-30: 50 mg via INTRAVENOUS
  Filled 2021-06-30: qty 1

## 2021-06-30 MED ORDER — HYDROMORPHONE HCL 1 MG/ML IJ SOLN: 1.0000 mg | Freq: Once | INTRAMUSCULAR | Status: DC

## 2021-06-30 MED ORDER — DIPHENHYDRAMINE HCL 25 MG PO CAPS
50.0000 mg | ORAL_CAPSULE | Freq: Once | ORAL | Status: DC
  Filled 2021-06-30: qty 2

## 2021-06-30 NOTE — Telephone Encounter (Signed)
Office notes faxed to [facility] [phone] they will call patient to scheduled. Patient informed, number given to patient as well [phone]

## 2021-06-30 NOTE — ED Triage Notes (Signed)
Pt states she was seen by OBGYN and has concern for impending ovarian torsion. Pt states she was prescribed pain medication, but has also been taking "a lot" of tylenol and ibuprofen to help with pain. Pt endorses vaginal bleeding, although adamant she is not on her menstrual cycle.
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
## 2021-06-30 NOTE — Discharge Instructions (Addendum)
Fortunately no evidence of ovarian torsion during this ER visit.  Please follow-up with your doctor for further care.  You may continue taking your home medication or over the counter medication as needed for pain

## 2021-06-30 NOTE — Telephone Encounter (Signed)
-----   Message from Salvadore Dom, MD sent at 06/29/2021  2:14 PM EDT ----- Please place a referral to the [facility] for Endometriosis for pelvic pain and h/o stage 4 endometriosis. Please let the patient know. Thanks, Sharee Pimple

## 2021-07-02 ENCOUNTER — Encounter: Payer: Self-pay | Admitting: Obstetrics and Gynecology

## 2021-07-02 ENCOUNTER — Telehealth: Payer: PPO | Admitting: Nurse Practitioner

## 2021-07-02 DIAGNOSIS — R102 Pelvic and perineal pain: Secondary | ICD-10-CM | POA: Diagnosis not present

## 2021-07-02 NOTE — Progress Notes (Signed)
Virtual Visit Consent   [name], you are scheduled for a virtual visit with a [facility] provider today. Just as with appointments in the office, your consent must be obtained to participate. Your consent will be active for this visit and any virtual visit you may have with one of our providers in the next 365 days. If you have a MyChart account, a copy of this consent can be sent to you electronically.  As this is a virtual visit, video technology does not allow for your provider to perform a traditional examination. This may limit your provider's ability to fully assess your condition. If your provider identifies any concerns that need to be evaluated in person or the need to arrange testing (such as labs, EKG, etc.), we will make arrangements to do so. Although advances in technology are sophisticated, we cannot ensure that it will always work on either your end or our end. If the connection with a video visit is poor, the visit may have to be switched to a telephone visit. With either a video or telephone visit, we are not always able to ensure that we have a secure connection.  By engaging in this virtual visit, you consent to the provision of healthcare and authorize for your insurance to be billed (if applicable) for the services provided during this visit. Depending on your insurance coverage, you may receive a charge related to this service.  I need to obtain your verbal consent now. Are you willing to proceed with your visit today? Yadira Mashayla Lavin has provided verbal consent on 07/02/2021 for a virtual visit (video or telephone). Gildardo Pounds, NP  Date: 07/02/2021 12:11 PM  Virtual Visit via Video Note   I, Gildardo Pounds, connected with  [name]  ([id number], 1995/11/28) on 07/02/21 at 11:30 AM EDT by a video-enabled telemedicine application and verified that I am speaking with the correct person using two identifiers.  Location: Patient: Virtual Visit Location  Patient: Home Provider: Virtual Visit Location Provider: Home Office   I discussed the limitations of evaluation and management by telemedicine and the availability of in person appointments. The patient expressed understanding and agreed to proceed.    History of Present Illness: [name] is a 26 y.o. who identifies as a female who was assigned female at birth, and is being seen today for pelvic pain   She has a history of enlarged left ovarian cyst for which her GYN has referred her to a [place] specialty center. She has a hx of stage 4 endometriosis, lupus, crohns disease, anxiety, panic disorder and MDD  She notes today worsening abdominal pain despite taking oxycodone which was prescribed to her by GYN on 06-29-2021.  On 06-30-2021 she went to the ED  for the same concern. PER ED NOTE: I have reviewed patient's previous ER visit as well as ilabs and imaging and considered in my plan of care.  Given her report of discomfort and OB/GYN recommendation to rule out for ovarian torsion, I did obtain a repeat pelvic ultrasound. She has a hx of SA and cannot tolerate pelvic exam or transvaginal ultrasound without sedation.  The ultrasound was independently visualized and interpreted by me and I agree with radiologist interpretation.  It appears patient has a similar appearance of a hypoechoic structure of the left ovary which could be possibly be a cyst or a corpus luteum but no other acute finding.  No evidence to suggest ovarian torsion.  Current symptoms  include blacking out (which she reported in the ED), swollen abdomen, hand, feet and leg along with nausea and vomiting for which she has prescribed phenergan and zofran already. States the pain medication and nausea medication are only temporarily providing relief.     Problems:  Patient Active Problem List   Diagnosis Date Noted   Anxiety    Hematemesis 06/17/2020   MDD (major depressive disorder), recurrent severe, without psychosis  ([place]) 05/30/2020   Panic disorder 05/30/2020   Major depressive disorder 05/29/2020   Suicide attempt by acetaminophen overdose ([place]) 05/29/2020   Obesity, Class III, BMI 40-49.9 (morbid obesity) ([place]) 04/05/2020   SOB (shortness of breath) 04/05/2020   Abdominal pain 04/04/2020   SLE (systemic lupus erythematosus related syndrome) ([place]) 04/04/2020   Shortness of breath 04/04/2020   Pneumonia 03/23/2020   Nausea 03/23/2020   Asthma 03/22/2020   Dysuria 02/03/2020   Hematuria 02/03/2020   Mass of urinary bladder 01/26/2020   Allergic rhinitis 05/05/2019   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   Endometriosis 03/14/2018   Long-term use of high-risk medication 07/02/2017   Lupus anticoagulant positive 07/02/2017   Personal history of pulmonary embolism 07/02/2017   Other forms of systemic lupus erythematosus ([place]) 03/12/2017   Anemia 02/28/2017   Migraine 02/28/2017   Dysmenorrhea 08/28/2011   Gastritis 08/28/2011    Allergies:  Allergies  Allergen Reactions   Azithromycin Anaphylaxis   Peanut-Containing Drug Products Other (See Comments)    Unknown ; treenuts, shell fish Unknown reaction, took allergy test   Iodinated Contrast Media Hives and Itching    02/04/2020 Patient stated has history of itching and hives with CT IV Iodine contrast, premedicated with Benadryl 25 mg IV prior to CT IV Iodine scan today.Post CT scan patient reported itching, Benadryl 25 mg IV given, symptoms resolved, NP recommends for patient to get premedicated with Benadryl 50 mg IV prior to future CT IV Iodine scans.    Other Other (See Comments)    Moderna covid vaccine -Syncope    Amoxicillin Swelling    Hand swelling    Fish Allergy Other (See Comments)    Unknown Allergy test    Gluten Meal Other (See Comments)    Celiac disease   Hydromet [Hydrocodone Bit-Homatrop Mbr] Nausea And Vomiting   Ibuprofen Other (See Comments)    Currently taking Eliquis   Lactose Intolerance (Gi) Nausea And Vomiting    Morphine And Related Itching    Can be taken with Benadryl   Pantoprazole Other (See Comments)    Stomach pain   Medications:  Current Outpatient Medications:    ALPRAZolam (XANAX) 1 MG tablet, Take 1.5 mg by mouth 2 (two) times daily., Disp: , Rfl:    apixaban (ELIQUIS) 5 MG TABS tablet, Take 5 mg by mouth 2 (two) times daily., Disp: , Rfl:    EPINEPHrine 0.3 mg/0.3 mL IJ SOAJ injection, Inject 0.3 mg into the muscle daily as needed (Allergic Reaction)., Disp: 1 each, Rfl: 0   hydroxychloroquine (PLAQUENIL) 200 MG tablet, Take 2 tablets (400 mg total) by mouth daily., Disp: 180 tablet, Rfl: 0   levETIRAcetam (KEPPRA) 500 MG tablet, Take 1 tablet (500 mg total) by mouth 2 (two) times daily., Disp: 60 tablet, Rfl: 0   ondansetron (ZOFRAN-ODT) 4 MG disintegrating tablet, Take 1 tablet (4 mg total) by mouth every 8 (eight) hours as needed for nausea or vomiting., Disp: 12 tablet, Rfl: 0   oxycodone (OXY-IR) 5 MG capsule, Take 1 capsule (5 mg total) by mouth every  6 (six) hours as needed., Disp: 10 capsule, Rfl: 0   promethazine (PHENERGAN) 25 MG tablet, Take by mouth., Disp: , Rfl:    zolpidem (AMBIEN) 10 MG tablet, Take 20 mg by mouth at bedtime., Disp: , Rfl:   Observations/Objective: Patient is well-developed, well-nourished in no acute distress.  Resting comfortably  at home.  Head is normocephalic, atraumatic.  No labored breathing.  Speech is clear and coherent with logical content.  Patient is alert and oriented at baseline.    Assessment and Plan: 1. Pelvic pain Follow up with [facility]  Return to ED only if symptoms worsen  Follow Up Instructions: I discussed the assessment and treatment plan with the patient. The patient was provided an opportunity to ask questions and all were answered. The patient agreed with the plan and demonstrated an understanding of the instructions.  A copy of instructions were sent to the patient via MyChart unless otherwise  noted below.    The patient was advised to call back or seek an in-person evaluation if the symptoms worsen or if the condition fails to improve as anticipated.  Time:  I spent 16 minutes with the patient via telehealth technology discussing the above problems/concerns.    Gildardo Pounds, NP

## 2021-07-02 NOTE — Patient Instructions (Signed)
  [name], thank you for joining Gildardo Pounds, NP for today's virtual visit.  While this provider is not your primary care provider (PCP), if your PCP is located in our provider database this encounter information will be shared with them immediately following your visit.  Consent: (Patient) [name] provided verbal consent for this virtual visit at the beginning of the encounter.  Current Medications:  Current Outpatient Medications:    ALPRAZolam (XANAX) 1 MG tablet, Take 1.5 mg by mouth 2 (two) times daily., Disp: , Rfl:    apixaban (ELIQUIS) 5 MG TABS tablet, Take 5 mg by mouth 2 (two) times daily., Disp: , Rfl:    EPINEPHrine 0.3 mg/0.3 mL IJ SOAJ injection, Inject 0.3 mg into the muscle daily as needed (Allergic Reaction)., Disp: 1 each, Rfl: 0   hydroxychloroquine (PLAQUENIL) 200 MG tablet, Take 2 tablets (400 mg total) by mouth daily., Disp: 180 tablet, Rfl: 0   levETIRAcetam (KEPPRA) 500 MG tablet, Take 1 tablet (500 mg total) by mouth 2 (two) times daily., Disp: 60 tablet, Rfl: 0   ondansetron (ZOFRAN-ODT) 4 MG disintegrating tablet, Take 1 tablet (4 mg total) by mouth every 8 (eight) hours as needed for nausea or vomiting., Disp: 12 tablet, Rfl: 0   oxycodone (OXY-IR) 5 MG capsule, Take 1 capsule (5 mg total) by mouth every 6 (six) hours as needed., Disp: 10 capsule, Rfl: 0   promethazine (PHENERGAN) 25 MG tablet, Take by mouth., Disp: , Rfl:    zolpidem (AMBIEN) 10 MG tablet, Take 20 mg by mouth at bedtime., Disp: , Rfl:    Medications ordered in this encounter:  No orders of the defined types were placed in this encounter.    *If you need refills on other medications prior to your next appointment, please contact your pharmacy*  Follow-Up: Call back or seek an in-person evaluation if the symptoms worsen or if the condition fails to improve as anticipated.  Other Instructions Follow up with [facility]  Return to ED only if symptoms  worsen   If you have been instructed to have an in-person evaluation today at a local Urgent Care facility, please use the link below. It will take you to a list of all of our available [facility], including address, phone number and hours of operation. Please do not delay care.  [facility]  If you or a family member do not have a primary care provider, use the link below to schedule a visit and establish care. When you choose a [facility] primary care physician or advanced practice provider, you gain a long-term partner in health. Find a Primary Care Provider  Learn more about [facility]'s in-office and virtual care options: [facility]

## 2021-07-03 ENCOUNTER — Telehealth: Payer: Self-pay

## 2021-07-03 NOTE — Telephone Encounter (Signed)
I spoke with patient and informed her of Dr. Gentry Fitz reply.  She said she will go ahead and head to ER again. She has not heard from [facility] yet.

## 2021-07-03 NOTE — Telephone Encounter (Signed)
Can you please see how quickly you can get her into [facility], if she needs surgery it should be with them. Would her going to the ER there help?

## 2021-07-03 NOTE — Telephone Encounter (Signed)
See phone note

## 2021-07-03 NOTE — Telephone Encounter (Signed)
Please call the patient, let her know that I reviewed her ultrasound images from 06/30/21, the ovarian cyst is resolving, now just looks like a normal follicle. It doesn't explain her severe pain. I have called over to [facility], have spoken with someone there and reviewed her history. They are having one of the Doctor's looking at her chart and trying to get her in Urg[facility].

## 2021-07-03 NOTE — Telephone Encounter (Signed)
Patient called in voice mail stating that she is vomiting and passed out and hit her head hard on the toilet. No knot or sign of injury.  Cannot eat or do her homework. Cannot walk upright as the pain is so bad.  She said it feels like she might need to go to the ER to get all this under control but the ER "acts like it doesn't know what to do without a doctor calling and telling them what to do".

## 2021-07-03 NOTE — Telephone Encounter (Signed)
I called [facility] at [phone] ( hit provider option) and was told that if you want patient to be seen quickly that the provider will need to call and speak with a provider directly at [facility] because they are scheduling new patients in Oct as of now.   I have not spoke with the patient yet about her going to ER at [facility] yet, because I wanted you to know about "getting her in quickly" part.   I did re-fax office notes with updated pelvic ultrasounds from ER to [phone]

## 2021-07-03 NOTE — Telephone Encounter (Signed)
Patient informed with below note. Patient was very appreciative, she mention being in pain and only having 1 pill oxycodone left. She is taking tylenol and ibuprofen with oxycodone all at once. Reports she is taking tylenol and ibuprofen every 2 hours for pain. She is using heating pad along with medication. She asked me to check with you to see if you have anymore recommendations to help with the pain? Or anything she can do different.

## 2021-07-03 NOTE — Telephone Encounter (Signed)
I think if she is hurting that badly she needs to go back to the ER. Has she heard from [facility] yet? I don't know if it would be beneficial for her to go to the ER there? The cyst is resolving so I don't think that is the source of her pain.

## 2021-07-04 ENCOUNTER — Telehealth: Payer: Self-pay | Admitting: *Deleted

## 2021-07-04 DIAGNOSIS — Z8742 Personal history of other diseases of the female genital tract: Secondary | ICD-10-CM

## 2021-07-04 NOTE — Telephone Encounter (Signed)
I tried multiple times to call the patient but the call would ring and disconnect. I sent patient a My Chart message (she uses My Chart regularly) and informed her of all the info that Dr. Talbert Nan relayed. I set the message to let me know if she has not read it by tomorrow. I let her know I will call [facility] in the morning to follow up and will let her know as soon as I do. I offered pain clinic referral as well.

## 2021-07-04 NOTE — Telephone Encounter (Signed)
July 04, 2021 [name] to [place] (supporting Salvadore Dom, MD)       07/04/21  6:57 AM I went to the [facility] in [place] and the doctor who I've seen before told me I'm sick becuase im constipated. I'm obviously not a doctor but I have Suezanne Jacquet going multiple times a day. He told me cysts that's small usually don't have major pain. I'm not sure but all of my cysts have caused me major problems. If it want very lateI was ready to drive to [facility].    I'm not sure which provider you are coordinating with but if they'd like to see me in the ER, I can Melburn Popper there. I am still in a lot of pain. I'm not sure if you are even able to refill the mediation since it was a short corse and I'm still not able to function (late assignments for school, spending hours in pain). Consultation usually doesn't do that so I'm not sure he told me that.    Please let me me know if there did anything I can do in the mean time. Thank you for banning J j o

## 2021-07-04 NOTE — Telephone Encounter (Signed)
Please let the patient know that I have reviewed her ER visit and CT scan. Her CT scan did not show any pelvic pathology, no cyst was seen (prior cyst has resolved). The only finding was an increased amount of stool. Please let her know that we will reach back out to [facility] tomorrow and offer her a referral to the pain clinic. We need to come up with an option other than narcotics to manage her pain.

## 2021-07-04 NOTE — Telephone Encounter (Signed)
Call to patient after voicemail left on triage line and MyChart message sent, see below. Patient states she was seen in the ER last night for persistent pelvic pain. States MD told her she was just constipated and gave her one pill and discharged her. Patient states she is having to take 2-4 tylenol every 2 hours and it is not touching the pain. Aware she should not be taking that much tylenol, but states she can't help it. Has been unable to sleep or do homework because the pain is so excruciating. Patient states she has still not heard from [facility]. Asking what if anything Dr. Talbert Nan can do at this point? RN advised Dr. Talbert Nan is in the OR this am, but would return call with recommendations from Dr. Talbert Nan. Patient agreeable.   Routing to provider for review.

## 2021-07-05 NOTE — Telephone Encounter (Signed)
I called [facility] this morning (appt admininistrator [employer], [phone]) and I just called again. Both times I had to leave a message and asked her to call me back with the status of this patient's urgent referral.

## 2021-07-06 ENCOUNTER — Encounter (HOSPITAL_BASED_OUTPATIENT_CLINIC_OR_DEPARTMENT_OTHER): Payer: Self-pay | Admitting: Emergency Medicine

## 2021-07-06 ENCOUNTER — Emergency Department (HOSPITAL_COMMUNITY): Payer: PPO

## 2021-07-06 ENCOUNTER — Other Ambulatory Visit: Payer: Self-pay

## 2021-07-06 ENCOUNTER — Emergency Department (HOSPITAL_BASED_OUTPATIENT_CLINIC_OR_DEPARTMENT_OTHER)
Admission: EM | Admit: 2021-07-06 | Discharge: 2021-07-06 | Disposition: A | Discharge status: Home / Self Care | Payer: PPO | Attending: Emergency Medicine | Admitting: Emergency Medicine

## 2021-07-06 DIAGNOSIS — N9489 Other specified conditions associated with female genital organs and menstrual cycle: Secondary | ICD-10-CM | POA: Diagnosis not present

## 2021-07-06 DIAGNOSIS — J45909 Unspecified asthma, uncomplicated: Secondary | ICD-10-CM | POA: Diagnosis not present

## 2021-07-06 DIAGNOSIS — N809 Endometriosis, unspecified: Secondary | ICD-10-CM | POA: Diagnosis not present

## 2021-07-06 DIAGNOSIS — R1032 Left lower quadrant pain: Secondary | ICD-10-CM | POA: Diagnosis present

## 2021-07-06 DIAGNOSIS — Z7901 Long term (current) use of anticoagulants: Secondary | ICD-10-CM | POA: Insufficient documentation

## 2021-07-06 LAB — COMPREHENSIVE METABOLIC PANEL
ALT: 11 U/L (ref 0–44)
AST: 12 U/L — ABNORMAL LOW (ref 15–41)
Albumin: 4.1 g/dL (ref 3.5–5.0)
Alkaline Phosphatase: 61 U/L (ref 38–126)
Anion gap: 10 (ref 5–15)
BUN: 9 mg/dL (ref 6–20)
CO2: 23 mmol/L (ref 22–32)
Calcium: 9.8 mg/dL (ref 8.9–10.3)
Chloride: 108 mmol/L (ref 98–111)
Creatinine, Ser: 0.72 mg/dL (ref 0.44–1.00)
GFR, Estimated: >60 mL/min (ref >60)
Glucose, Bld: 88 mg/dL (ref 70–99)
Potassium: 3.7 mmol/L (ref 3.5–5.1)
Sodium: 141 mmol/L (ref 135–145)
Total Bilirubin: 0.2 mg/dL — ABNORMAL LOW (ref 0.3–1.2)
Total Protein: 6.7 g/dL (ref 6.5–8.1)

## 2021-07-06 LAB — CBC WITH DIFFERENTIAL/PLATELET
Abs Immature Granulocytes: 0.02 10*3/uL (ref 0.00–0.07)
Basophils Absolute: 0 10*3/uL (ref 0.0–0.1)
Basophils Relative: 0 %
Eosinophils Absolute: 0.4 10*3/uL (ref 0.0–0.5)
Eosinophils Relative: 4 %
HCT: 32.9 % — ABNORMAL LOW (ref 36.0–46.0)
Hemoglobin: 10.5 g/dL — ABNORMAL LOW (ref 12.0–15.0)
Immature Granulocytes: 0 %
Lymphocytes Relative: 28 %
Lymphs Abs: 2.4 10*3/uL (ref 0.7–4.0)
MCH: 23.5 pg — ABNORMAL LOW (ref 26.0–34.0)
MCHC: 31.9 g/dL (ref 30.0–36.0)
MCV: 73.6 fL — ABNORMAL LOW (ref 80.0–100.0)
Monocytes Absolute: 0.5 10*3/uL (ref 0.1–1.0)
Monocytes Relative: 6 %
Neutro Abs: 5.3 10*3/uL (ref 1.7–7.7)
Neutrophils Relative %: 62 %
Platelets: 424 10*3/uL — ABNORMAL HIGH (ref 150–400)
RBC: 4.47 MIL/uL (ref 3.87–5.11)
RDW: 17.3 % — ABNORMAL HIGH (ref 11.5–15.5)
WBC: 8.6 10*3/uL (ref 4.0–10.5)
nRBC: 0 % (ref 0.0–0.2)

## 2021-07-06 LAB — URINALYSIS, ROUTINE W REFLEX MICROSCOPIC
Bilirubin Urine: NEGATIVE
Glucose, UA: NEGATIVE mg/dL
Hgb urine dipstick: NEGATIVE
Ketones, ur: NEGATIVE mg/dL
Leukocytes,Ua: NEGATIVE
Nitrite: NEGATIVE
Protein, ur: NEGATIVE mg/dL
Specific Gravity, Urine: 1.013 (ref 1.005–1.030)
pH: 5 (ref 5.0–8.0)

## 2021-07-06 LAB — HCG, SERUM, QUALITATIVE: Preg, Serum: NEGATIVE

## 2021-07-06 LAB — LIPASE, BLOOD: Lipase: 21 U/L (ref 11–51)

## 2021-07-06 MED ORDER — LACTATED RINGERS IV BOLUS
1000.0000 mL | Freq: Once | INTRAVENOUS | Status: AC
  Administered 2021-07-06: 1000 mL via INTRAVENOUS

## 2021-07-06 MED ORDER — DIPHENHYDRAMINE HCL 50 MG/ML IJ SOLN
25.0000 mg | Freq: Once | INTRAMUSCULAR | Status: AC
  Administered 2021-07-06: 25 mg via INTRAVENOUS
  Filled 2021-07-06: qty 1

## 2021-07-06 MED ORDER — HYDROMORPHONE HCL 1 MG/ML IJ SOLN
1.0000 mg | Freq: Once | INTRAMUSCULAR | Status: AC
  Administered 2021-07-06: 1 mg via INTRAVENOUS
  Filled 2021-07-06: qty 1

## 2021-07-06 MED ORDER — DIPHENHYDRAMINE HCL 50 MG/ML IJ SOLN
25.0000 mg | Freq: Once | INTRAMUSCULAR | Status: AC
Start: 2021-07-06 — End: 2021-07-06
  Administered 2021-07-06: 25 mg via INTRAVENOUS
  Filled 2021-07-06: qty 1

## 2021-07-06 MED ORDER — SODIUM CHLORIDE 0.9 % IV BOLUS
1000.0000 mL | Freq: Once | INTRAVENOUS | Status: AC
  Administered 2021-07-06: 1000 mL via INTRAVENOUS

## 2021-07-06 MED ORDER — ONDANSETRON HCL 4 MG/2ML IJ SOLN
4.0000 mg | Freq: Once | INTRAMUSCULAR | Status: AC
  Administered 2021-07-06: 4 mg via INTRAVENOUS
  Filled 2021-07-06: qty 2

## 2021-07-06 MED ORDER — OXYCODONE HCL 5 MG PO TABS: 5.0000 mg | ORAL_TABLET | ORAL | 0 refills | Status: DC | PRN

## 2021-07-06 NOTE — ED Provider Notes (Signed)
[facility] EMERGENCY DEPT Provider Note   CSN: [id number] Arrival date & time: 07/06/21  1119     History  Chief Complaint  Patient presents with   Abdominal Pain    [name] is a 26 y.o. female.   Abdominal Pain Associated symptoms: no chest pain, no chills, no cough, no dysuria, no fever, no hematuria, no shortness of breath, no sore throat and no vomiting    26 year old female presents emergency department with complaints of left lower quadrant abdominal pain.  Patient states that she has a history of endometriosis as well as ovarian cysts.  She noted vaginal bleeding the prior 3 days with no bleeding today.  She states that her pain has not increased acutely but has been significantly worse since her diagnosis of endometriosis.  Her current pain level is no different from the week or 2 prior.  She notes she has been taking Tylenol for pain.  She has been out of promethazine as well as her oxycodone as of today.  Denies fever, chills, night sweats, chest pain, shortness of breath.   Past Medical History:  Diagnosis Date   Asthma    Celiac disease    Collagen vascular disease ([place])    Diarrhea    Patient mentions diagnosis of ulcerative colitis but it is not clear she actually has UC   Endometriosis    Lupus ([place])    Ovarian cyst    Panic disorder 05/30/2020   Pulmonary embolus ([place]) 02/06/2020   Seizures ([place])    Past Surgical History:  Procedure Laterality Date   APPENDECTOMY     CHOLECYSTECTOMY  2015   ESOPHAGOGASTRODUODENOSCOPY (EGD) WITH PROPOFOL N/A 06/18/2020   Procedure: ESOPHAGOGASTRODUODENOSCOPY (EGD) WITH PROPOFOL;  Surgeon: Doran Stabler, MD;  Location: [place];  Service: Gastroenterology;  Laterality: N/A;   EXCISION OF ENDOMETRIOMA     Ovarian cyst removal   excision of endometriosis     FOOT FRACTURE SURGERY     w/ hardware   HERNIA REPAIR     TONSILLECTOMY       Home Medications Prior to Admission medications    Medication Sig Start Date End Date Taking? Authorizing Provider  oxyCODONE (ROXICODONE) 5 MG immediate release tablet Take 1 tablet (5 mg total) by mouth every 4 (four) hours as needed for severe pain. 07/06/21  Yes Gareth Morgan, MD  ALPRAZolam Duanne Moron) 1 MG tablet Take 1.5 mg by mouth 2 (two) times daily. 02/13/21   [provider]  apixaban (ELIQUIS) 5 MG TABS tablet Take 5 mg by mouth 2 (two) times daily.    [provider]  EPINEPHrine 0.3 mg/0.3 mL IJ SOAJ injection Inject 0.3 mg into the muscle daily as needed (Allergic Reaction). 06/01/20   Lindell Spar I, NP  hydroxychloroquine (PLAQUENIL) 200 MG tablet Take 2 tablets (400 mg total) by mouth daily. 03/23/20   Collier Salina, MD  levETIRAcetam (KEPPRA) 500 MG tablet Take 1 tablet (500 mg total) by mouth 2 (two) times daily. 06/03/21   Isla Pence, MD  ondansetron (ZOFRAN-ODT) 4 MG disintegrating tablet Take 1 tablet (4 mg total) by mouth every 8 (eight) hours as needed for nausea or vomiting. 06/26/21   Tacy Learn, PA-C  oxycodone (OXY-IR) 5 MG capsule Take 1 capsule (5 mg total) by mouth every 6 (six) hours as needed. 06/29/21   Salvadore Dom, MD  promethazine (PHENERGAN) 25 MG tablet Take by mouth. 06/28/21   [provider]  zolpidem (AMBIEN) 10  MG tablet Take 20 mg by mouth at bedtime. 06/02/20   [provider]  dicyclomine (BENTYL) 20 MG tablet Take 1 tablet (20 mg total) by mouth 2 (two) times daily. 11/29/18 04/11/19  Muthersbaugh, Jarrett Soho, PA-C  Fluticasone Propionate HFA (FLOVENT HFA IN) Inhale into the lungs. Patient not taking: Reported on 11/14/2019  11/14/19  [provider]      Allergies    Azithromycin, Peanut-containing drug products, Iodinated contrast media, Other, Amoxicillin, Fish allergy, Gluten meal, Hydromet [hydrocodone bit-homatrop mbr], Ibuprofen, Lactose intolerance (gi), Morphine and related, and Pantoprazole    Review of Systems   Review of Systems   Constitutional:  Negative for chills and fever.  HENT:  Negative for ear pain and sore throat.   Eyes:  Negative for pain and visual disturbance.  Respiratory:  Negative for cough and shortness of breath.   Cardiovascular:  Negative for chest pain and palpitations.  Gastrointestinal:  Positive for abdominal pain. Negative for vomiting.  Genitourinary:  Negative for dysuria and hematuria.  Musculoskeletal:  Negative for arthralgias and back pain.  Skin:  Negative for color change and rash.  Neurological:  Negative for seizures and syncope.  All other systems reviewed and are negative.   Physical Exam Updated Vital Signs BP 138/85   Pulse 85   Temp (!) 97.5 F (36.4 C) (Oral)   Resp 18   SpO2 97%  Physical Exam Vitals and nursing note reviewed.  Constitutional:      General: She is not in acute distress.    Appearance: She is well-developed.  HENT:     Head: Normocephalic and atraumatic.  Eyes:     Extraocular Movements: Extraocular movements intact.     Conjunctiva/sclera: Conjunctivae normal.  Cardiovascular:     Rate and Rhythm: Normal rate and regular rhythm.     Heart sounds: No murmur heard. Pulmonary:     Effort: Pulmonary effort is normal. No respiratory distress.     Breath sounds: Normal breath sounds.  Abdominal:     Palpations: Abdomen is soft.     Tenderness: There is abdominal tenderness in the left lower quadrant. There is no right CVA tenderness, left CVA tenderness or rebound. Negative signs include Murphy's sign, Rovsing's sign and McBurney's sign.  Genitourinary:    Comments: Patient declined Pelvic exam Musculoskeletal:        General: No swelling.     Cervical back: Neck supple.  Skin:    General: Skin is warm and dry.     Capillary Refill: Capillary refill takes less than 2 seconds.  Neurological:     Mental Status: She is alert.  Psychiatric:        Mood and Affect: Mood normal.     ED Results / Procedures / Treatments   Labs (all labs  ordered are listed, but only abnormal results are displayed) Labs Reviewed  COMPREHENSIVE METABOLIC PANEL - Abnormal; Notable for the following components:      Result Value   AST 12 (*)    Total Bilirubin 0.2 (*)    All other components within normal limits  CBC WITH DIFFERENTIAL/PLATELET - Abnormal; Notable for the following components:   Hemoglobin 10.5 (*)    HCT 32.9 (*)    MCV 73.6 (*)    MCH 23.5 (*)    RDW 17.3 (*)    Platelets 424 (*)    All other components within normal limits  URINALYSIS, ROUTINE W REFLEX MICROSCOPIC - Abnormal; Notable for the following components:  Color, Urine COLORLESS (*)    All other components within normal limits  LIPASE, BLOOD  HCG, SERUM, QUALITATIVE    EKG None  Radiology No results found.  Procedures Procedures    Medications Ordered in ED Medications  sodium chloride 0.9 % bolus 1,000 mL (0 mLs Intravenous Stopped 07/06/21 2021)  ondansetron (ZOFRAN) injection 4 mg (4 mg Intravenous Given 07/06/21 1636)  HYDROmorphone (DILAUDID) injection 1 mg (1 mg Intravenous Given 07/06/21 2035)  diphenhydrAMINE (BENADRYL) injection 25 mg (25 mg Intravenous Given 07/06/21 2035)  lactated ringers bolus 1,000 mL (0 mLs Intravenous Stopped 07/06/21 2238)  HYDROmorphone (DILAUDID) injection 1 mg (1 mg Intravenous Given 07/06/21 2233)  diphenhydrAMINE (BENADRYL) injection 25 mg (25 mg Intravenous Given 07/06/21 2233)    ED Course/ Medical Decision Making/ A&P                           Medical Decision Making Amount and/or Complexity of Data Reviewed Labs: ordered. Radiology: ordered.  Risk Prescription drug management.   This patient presents to the ED for concern of abdominal pain, this involves an extensive number of treatment options, and is a complaint that carries with it a high risk of complications and morbidity.  The differential diagnosis includes The causes of generalized abdominal pain include but are not limited to AAA, mesenteric  ischemia, appendicitis, diverticulitis, DKA, gastritis, gastroenteritis, AMI, nephrolithiasis, pancreatitis, peritonitis, adrenal insufficiency,lead poisoning, iron toxicity, intestinal ischemia, constipation, UTI,SBO/LBO, splenic rupture, biliary disease, IBD, IBS, PUD, or hepatitis. Ectopic pregnancy, ovarian torsion, PID.    Co morbidities that complicate the patient evaluation  See HPI  Lab Tests:  I Ordered, and personally interpreted labs.  The pertinent results include:  Hemoglobin 10.5   Imaging Studies ordered:  See ED course. Patient denied CT abdomen/pelvis. Transfer put in for [facility] for transvaginal ultrasound   Cardiac Monitoring: / EKG:  The patient was maintained on a cardiac monitor.  I personally viewed and interpreted the cardiac monitored which showed an underlying rhythm of: sinus rhythm   Consultations Obtained:  N/a   Problem List / ED Course / Critical interventions / Medication management  Abdominal pain I ordered medication including 1L NS and zofran  Reevaluation of the patient after these medicines showed that the patient improved I have reviewed the patients home medicines and have made adjustments as needed   Social Determinants of Health:  Denies tobacco/illicit drug use   Test / Admission - Considered:  LLQ Pelvic pain Vitals signs significant for mild HTN . Otherwise within normal range and stable throughout visit. Laboratory/imaging studies significant for: no acute abnormalities Given patients history of endometriosis as well as ovarian cysts and chronic pelvic pain, study of choice is pelvic ultrasound to rule out most concerning acute pathologies such as  peritonitis/PID/torsion. Patient declined CT and pelvic exam at this time, and ultrasound is not available at this hour. Transfer was arranged for patient to be seen at [facility] where ultrasound is available. Patient was made aware of treatment plan and expressed  understanding/was agreeable. ED was contacted regarding patient Upon transfer, patient was in stable condition         Final Clinical Impression(s) / ED Diagnoses Final diagnoses:  Left lower quadrant abdominal pain  Endometriosis    Rx / DC Orders ED Discharge Orders          Ordered    oxyCODONE (ROXICODONE) 5 MG immediate release tablet  Every 4 hours PRN  07/06/21 2217              Wilnette Kales, PA 07/16/21 1009    Sherwood Gambler, MD 07/21/21 [phone]

## 2021-07-06 NOTE — ED Notes (Signed)
Pt currently waiting on ultrasound

## 2021-07-06 NOTE — Telephone Encounter (Signed)
Dr.Jertson Neoma Laming called patient from [facility] and said the provider asked if you would be willing to order a MRI? States this will help them out if this could be done, I did tell Neoma Laming CT scan was ordered on 06/24/21. Neoma Laming reports the MRI will give a better visual for any scar tissue. Please advise   Just FYI it appears patient has been admitted to hospital.
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
## 2021-07-06 NOTE — Telephone Encounter (Signed)
Oleta Mouse with [facility] returned call with apologies due to unforseen circumstances, she was out of office yesterday. She reported that she was the one to talk with Dr. Talbert Nan and she is actively working with the providers to get the pt in as quickly as possible. Right now the earliest available is 08/04/21 and they placed the pt in that spot just to hold it but she is still actively working with other providers to get pt in sooner. She will reach out to [place] if she needs. She did provide her direct number also as listed below [phone]) [phone].

## 2021-07-10 NOTE — Telephone Encounter (Signed)
Please order a pelvic MRI as requested by [facility]. Please let Radiology know that they are specifically looking for endometriosis and scar tissue, I assume Radiology will recommend contrast.

## 2021-07-10 NOTE — Telephone Encounter (Signed)
Per Hilda Blades at [facility] "Right now the earliest available is 08/04/21 and they placed the pt in that spot just to hold it but she is still actively working with other providers to get pt in sooner"

## 2021-08-01 ENCOUNTER — Encounter: Payer: PPO | Admitting: Obstetrics & Gynecology

## 2021-08-08 NOTE — Telephone Encounter (Signed)
Office received fax from [facility] for patient they have made several attempts to contact her and unable to connect with patient.  The referral will be left open for 6 month.  Patient also did not schedule MRI as recommended.

## 2021-08-23 ENCOUNTER — Other Ambulatory Visit: Payer: Self-pay

## 2021-08-23 ENCOUNTER — Encounter (HOSPITAL_COMMUNITY): Payer: Self-pay | Admitting: Emergency Medicine

## 2021-08-23 ENCOUNTER — Emergency Department (HOSPITAL_COMMUNITY): Payer: Self-pay

## 2021-08-23 ENCOUNTER — Inpatient Hospital Stay (HOSPITAL_COMMUNITY)
Admission: EM | Admit: 2021-08-23 | Discharge: 2021-08-25 | DRG: 917 | Disposition: A | Discharge status: Other Acute Inpatient Hospital | Payer: PPO | Attending: Internal Medicine | Admitting: Internal Medicine

## 2021-08-23 DIAGNOSIS — Z62810 Personal history of physical and sexual abuse in childhood: Secondary | ICD-10-CM | POA: Diagnosis present

## 2021-08-23 DIAGNOSIS — Z7951 Long term (current) use of inhaled steroids: Secondary | ICD-10-CM

## 2021-08-23 DIAGNOSIS — F410 Panic disorder [episodic paroxysmal anxiety] without agoraphobia: Secondary | ICD-10-CM | POA: Diagnosis present

## 2021-08-23 DIAGNOSIS — T391X2A Poisoning by 4-Aminophenol derivatives, intentional self-harm, initial encounter: Secondary | ICD-10-CM | POA: Diagnosis not present

## 2021-08-23 DIAGNOSIS — Z91011 Allergy to milk products: Secondary | ICD-10-CM

## 2021-08-23 DIAGNOSIS — Z91018 Allergy to other foods: Secondary | ICD-10-CM

## 2021-08-23 DIAGNOSIS — R109 Unspecified abdominal pain: Secondary | ICD-10-CM | POA: Diagnosis present

## 2021-08-23 DIAGNOSIS — Z91041 Radiographic dye allergy status: Secondary | ICD-10-CM

## 2021-08-23 DIAGNOSIS — F6089 Other specific personality disorders: Secondary | ICD-10-CM | POA: Diagnosis present

## 2021-08-23 DIAGNOSIS — F429 Obsessive-compulsive disorder, unspecified: Secondary | ICD-10-CM | POA: Diagnosis present

## 2021-08-23 DIAGNOSIS — F3181 Bipolar II disorder: Secondary | ICD-10-CM | POA: Diagnosis present

## 2021-08-23 DIAGNOSIS — I998 Other disorder of circulatory system: Secondary | ICD-10-CM | POA: Diagnosis present

## 2021-08-23 DIAGNOSIS — F5104 Psychophysiologic insomnia: Secondary | ICD-10-CM | POA: Diagnosis present

## 2021-08-23 DIAGNOSIS — Z91010 Allergy to peanuts: Secondary | ICD-10-CM

## 2021-08-23 DIAGNOSIS — Z8249 Family history of ischemic heart disease and other diseases of the circulatory system: Secondary | ICD-10-CM

## 2021-08-23 DIAGNOSIS — F332 Major depressive disorder, recurrent severe without psychotic features: Secondary | ICD-10-CM | POA: Diagnosis present

## 2021-08-23 DIAGNOSIS — Z888 Allergy status to other drugs, medicaments and biological substances status: Secondary | ICD-10-CM

## 2021-08-23 DIAGNOSIS — Z9049 Acquired absence of other specified parts of digestive tract: Secondary | ICD-10-CM

## 2021-08-23 DIAGNOSIS — Z20822 Contact with and (suspected) exposure to covid-19: Secondary | ICD-10-CM | POA: Diagnosis present

## 2021-08-23 DIAGNOSIS — T50901A Poisoning by unspecified drugs, medicaments and biological substances, accidental (unintentional), initial encounter: Secondary | ICD-10-CM | POA: Diagnosis present

## 2021-08-23 DIAGNOSIS — Z91148 Patient's other noncompliance with medication regimen for other reason: Secondary | ICD-10-CM

## 2021-08-23 DIAGNOSIS — N83209 Unspecified ovarian cyst, unspecified side: Secondary | ICD-10-CM | POA: Diagnosis present

## 2021-08-23 DIAGNOSIS — T424X1A Poisoning by benzodiazepines, accidental (unintentional), initial encounter: Secondary | ICD-10-CM | POA: Insufficient documentation

## 2021-08-23 DIAGNOSIS — K900 Celiac disease: Secondary | ICD-10-CM | POA: Diagnosis present

## 2021-08-23 DIAGNOSIS — M328 Other forms of systemic lupus erythematosus: Secondary | ICD-10-CM

## 2021-08-23 DIAGNOSIS — F4310 Post-traumatic stress disorder, unspecified: Secondary | ICD-10-CM | POA: Diagnosis present

## 2021-08-23 DIAGNOSIS — G928 Other toxic encephalopathy: Secondary | ICD-10-CM | POA: Diagnosis present

## 2021-08-23 DIAGNOSIS — D509 Iron deficiency anemia, unspecified: Secondary | ICD-10-CM | POA: Diagnosis present

## 2021-08-23 DIAGNOSIS — Z885 Allergy status to narcotic agent status: Secondary | ICD-10-CM

## 2021-08-23 DIAGNOSIS — Z881 Allergy status to other antibiotic agents status: Secondary | ICD-10-CM

## 2021-08-23 DIAGNOSIS — Z79899 Other long term (current) drug therapy: Secondary | ICD-10-CM

## 2021-08-23 DIAGNOSIS — Z91199 Patient's noncompliance with other medical treatment and regimen due to unspecified reason: Secondary | ICD-10-CM

## 2021-08-23 DIAGNOSIS — E872 Acidosis, unspecified: Secondary | ICD-10-CM | POA: Diagnosis present

## 2021-08-23 DIAGNOSIS — Z6841 Body Mass Index (BMI) 40.0 and over, adult: Secondary | ICD-10-CM

## 2021-08-23 DIAGNOSIS — Z86711 Personal history of pulmonary embolism: Secondary | ICD-10-CM

## 2021-08-23 DIAGNOSIS — T50904A Poisoning by unspecified drugs, medicaments and biological substances, undetermined, initial encounter: Secondary | ICD-10-CM | POA: Diagnosis not present

## 2021-08-23 DIAGNOSIS — R45851 Suicidal ideations: Secondary | ICD-10-CM | POA: Diagnosis present

## 2021-08-23 DIAGNOSIS — R4182 Altered mental status, unspecified: Secondary | ICD-10-CM

## 2021-08-23 DIAGNOSIS — M329 Systemic lupus erythematosus, unspecified: Secondary | ICD-10-CM | POA: Diagnosis present

## 2021-08-23 DIAGNOSIS — G40909 Epilepsy, unspecified, not intractable, without status epilepticus: Secondary | ICD-10-CM | POA: Diagnosis present

## 2021-08-23 DIAGNOSIS — T50904D Poisoning by unspecified drugs, medicaments and biological substances, undetermined, subsequent encounter: Secondary | ICD-10-CM

## 2021-08-23 DIAGNOSIS — Z9151 Personal history of suicidal behavior: Secondary | ICD-10-CM

## 2021-08-23 DIAGNOSIS — G8929 Other chronic pain: Secondary | ICD-10-CM | POA: Diagnosis present

## 2021-08-23 DIAGNOSIS — Z886 Allergy status to analgesic agent status: Secondary | ICD-10-CM

## 2021-08-23 DIAGNOSIS — Z7901 Long term (current) use of anticoagulants: Secondary | ICD-10-CM

## 2021-08-23 LAB — SALICYLATE LEVEL: Salicylate Lvl: <7 mg/dL — ABNORMAL LOW (ref 7.0–30.0)

## 2021-08-23 LAB — CBC WITH DIFFERENTIAL/PLATELET
Abs Immature Granulocytes: 0.03 10*3/uL (ref 0.00–0.07)
Basophils Absolute: 0 10*3/uL (ref 0.0–0.1)
Basophils Relative: 0 %
Eosinophils Absolute: 0.1 10*3/uL (ref 0.0–0.5)
Eosinophils Relative: 1 %
HCT: 35.1 % — ABNORMAL LOW (ref 36.0–46.0)
Hemoglobin: 11.4 g/dL — ABNORMAL LOW (ref 12.0–15.0)
Immature Granulocytes: 0 %
Lymphocytes Relative: 30 %
Lymphs Abs: 2.4 10*3/uL (ref 0.7–4.0)
MCH: 24 pg — ABNORMAL LOW (ref 26.0–34.0)
MCHC: 32.5 g/dL (ref 30.0–36.0)
MCV: 73.9 fL — ABNORMAL LOW (ref 80.0–100.0)
Monocytes Absolute: 0.6 10*3/uL (ref 0.1–1.0)
Monocytes Relative: 8 %
Neutro Abs: 4.8 10*3/uL (ref 1.7–7.7)
Neutrophils Relative %: 61 %
Platelets: 389 10*3/uL (ref 150–400)
RBC: 4.75 MIL/uL (ref 3.87–5.11)
RDW: 17.2 % — ABNORMAL HIGH (ref 11.5–15.5)
WBC: 7.9 10*3/uL (ref 4.0–10.5)
nRBC: 0 % (ref 0.0–0.2)

## 2021-08-23 LAB — ACETAMINOPHEN LEVEL
Acetaminophen (Tylenol), Serum: 218 ug/mL (ref 10–30)
Acetaminophen (Tylenol), Serum: 82 ug/mL — ABNORMAL HIGH (ref 10–30)
Acetaminophen (Tylenol), Serum: <10 ug/mL — ABNORMAL LOW (ref 10–30)
Acetaminophen (Tylenol), Serum: <10 ug/mL — ABNORMAL LOW (ref 10–30)

## 2021-08-23 LAB — COMPREHENSIVE METABOLIC PANEL
ALT: 11 U/L (ref 0–44)
ALT: 11 U/L (ref 0–44)
AST: 17 U/L (ref 15–41)
AST: 15 U/L (ref 15–41)
Albumin: 3.9 g/dL (ref 3.5–5.0)
Albumin: 3.6 g/dL (ref 3.5–5.0)
Alkaline Phosphatase: 72 U/L (ref 38–126)
Alkaline Phosphatase: 64 U/L (ref 38–126)
Anion gap: 10 (ref 5–15)
Anion gap: 5 (ref 5–15)
BUN: 5 mg/dL — ABNORMAL LOW (ref 6–20)
BUN: <5 mg/dL — ABNORMAL LOW (ref 6–20)
CO2: 18 mmol/L — ABNORMAL LOW (ref 22–32)
CO2: 21 mmol/L — ABNORMAL LOW (ref 22–32)
Calcium: 8.9 mg/dL (ref 8.9–10.3)
Calcium: 8.7 mg/dL — ABNORMAL LOW (ref 8.9–10.3)
Chloride: 111 mmol/L (ref 98–111)
Chloride: 112 mmol/L — ABNORMAL HIGH (ref 98–111)
Creatinine, Ser: 0.76 mg/dL (ref 0.44–1.00)
Creatinine, Ser: 0.72 mg/dL (ref 0.44–1.00)
GFR, Estimated: >60 mL/min (ref >60)
GFR, Estimated: >60 mL/min (ref >60)
Glucose, Bld: 121 mg/dL — ABNORMAL HIGH (ref 70–99)
Glucose, Bld: 101 mg/dL — ABNORMAL HIGH (ref 70–99)
Potassium: 3.6 mmol/L (ref 3.5–5.1)
Potassium: 3.9 mmol/L (ref 3.5–5.1)
Sodium: 139 mmol/L (ref 135–145)
Sodium: 138 mmol/L (ref 135–145)
Total Bilirubin: <0.1 mg/dL — ABNORMAL LOW (ref 0.3–1.2)
Total Bilirubin: 0.5 mg/dL (ref 0.3–1.2)
Total Protein: 7.1 g/dL (ref 6.5–8.1)
Total Protein: 6.3 g/dL — ABNORMAL LOW (ref 6.5–8.1)

## 2021-08-23 LAB — CBC
HCT: 33.3 % — ABNORMAL LOW (ref 36.0–46.0)
Hemoglobin: 10.8 g/dL — ABNORMAL LOW (ref 12.0–15.0)
MCH: 24.1 pg — ABNORMAL LOW (ref 26.0–34.0)
MCHC: 32.4 g/dL (ref 30.0–36.0)
MCV: 74.3 fL — ABNORMAL LOW (ref 80.0–100.0)
Platelets: 405 10*3/uL — ABNORMAL HIGH (ref 150–400)
RBC: 4.48 MIL/uL (ref 3.87–5.11)
RDW: 17.3 % — ABNORMAL HIGH (ref 11.5–15.5)
WBC: 6.2 10*3/uL (ref 4.0–10.5)
nRBC: 0 % (ref 0.0–0.2)

## 2021-08-23 LAB — HIV ANTIBODY (ROUTINE TESTING W REFLEX): HIV Screen 4th Generation wRfx: NONREACTIVE

## 2021-08-23 LAB — URINALYSIS, ROUTINE W REFLEX MICROSCOPIC
Bilirubin Urine: NEGATIVE
Glucose, UA: NEGATIVE mg/dL
Hgb urine dipstick: NEGATIVE
Ketones, ur: NEGATIVE mg/dL
Leukocytes,Ua: NEGATIVE
Nitrite: NEGATIVE
Protein, ur: NEGATIVE mg/dL
Specific Gravity, Urine: 1.027 (ref 1.005–1.030)
pH: 5 (ref 5.0–8.0)

## 2021-08-23 LAB — PHOSPHORUS: Phosphorus: 3.1 mg/dL (ref 2.5–4.6)

## 2021-08-23 LAB — I-STAT BETA HCG BLOOD, ED (MC, WL, AP ONLY): I-stat hCG, quantitative: <5 m[IU]/mL (ref <5)

## 2021-08-23 LAB — HEPATIC FUNCTION PANEL
ALT: 10 U/L (ref 0–44)
AST: 13 U/L — ABNORMAL LOW (ref 15–41)
Albumin: 3.2 g/dL — ABNORMAL LOW (ref 3.5–5.0)
Alkaline Phosphatase: 58 U/L (ref 38–126)
Bilirubin, Direct: <0.1 mg/dL (ref 0.0–0.2)
Total Bilirubin: 0.3 mg/dL (ref 0.3–1.2)
Total Protein: 6.1 g/dL — ABNORMAL LOW (ref 6.5–8.1)

## 2021-08-23 LAB — RAPID URINE DRUG SCREEN, HOSP PERFORMED
Amphetamines: NOT DETECTED
Barbiturates: NOT DETECTED
Benzodiazepines: POSITIVE — AB
Cocaine: NOT DETECTED
Opiates: NOT DETECTED
Tetrahydrocannabinol: NOT DETECTED

## 2021-08-23 LAB — ETHANOL: Alcohol, Ethyl (B): <10 mg/dL (ref <10)

## 2021-08-23 LAB — MAGNESIUM
Magnesium: 1.9 mg/dL (ref 1.7–2.4)
Magnesium: 2 mg/dL (ref 1.7–2.4)

## 2021-08-23 LAB — PROTIME-INR
INR: 1.2 (ref 0.8–1.2)
Prothrombin Time: 14.7 seconds (ref 11.4–15.2)

## 2021-08-23 MED ORDER — ONDANSETRON HCL 4 MG/2ML IJ SOLN
4.0000 mg | Freq: Once | INTRAMUSCULAR | Status: AC
Start: 2021-08-23 — End: 2021-08-23
  Administered 2021-08-23: 4 mg via INTRAVENOUS
  Filled 2021-08-23: qty 2

## 2021-08-23 MED ORDER — KETOROLAC TROMETHAMINE 15 MG/ML IJ SOLN
15.0000 mg | Freq: Once | INTRAMUSCULAR | Status: DC
Start: 2021-08-23 — End: 2021-08-23
  Filled 2021-08-23: qty 1

## 2021-08-23 MED ORDER — POTASSIUM CHLORIDE 10 MEQ/100ML IV SOLN
10.0000 meq | INTRAVENOUS | Status: AC
  Administered 2021-08-23 (×2): 10 meq via INTRAVENOUS
  Filled 2021-08-23: qty 100

## 2021-08-23 MED ORDER — LEVETIRACETAM 500 MG PO TABS: 500.0000 mg | ORAL_TABLET | Freq: Two times a day (BID) | ORAL | Status: DC

## 2021-08-23 MED ORDER — SODIUM CHLORIDE 0.9 % IV SOLN: INTRAVENOUS | Status: DC

## 2021-08-23 MED ORDER — DEXTROSE 5 % IV SOLN
1500.0000 mg/h | INTRAVENOUS | Status: DC
  Administered 2021-08-23 (×2): 1500 mg/h via INTRAVENOUS
  Filled 2021-08-23 (×4): qty 90

## 2021-08-23 MED ORDER — ONDANSETRON HCL 4 MG/2ML IJ SOLN
4.0000 mg | Freq: Four times a day (QID) | INTRAMUSCULAR | Status: DC | PRN
  Administered 2021-08-23 – 2021-08-25 (×5): 4 mg via INTRAVENOUS
  Filled 2021-08-23 (×5): qty 2

## 2021-08-23 MED ORDER — ACETYLCYSTEINE LOAD VIA INFUSION
15000.0000 mg | Freq: Once | INTRAVENOUS | Status: AC
  Administered 2021-08-23: 15000 mg via INTRAVENOUS
  Filled 2021-08-23 (×2): qty 492

## 2021-08-23 MED ORDER — METHOCARBAMOL 1000 MG/10ML IJ SOLN
500.0000 mg | Freq: Once | INTRAVENOUS | Status: DC
  Filled 2021-08-23: qty 5

## 2021-08-23 MED ORDER — LACTATED RINGERS IV SOLN: INTRAVENOUS | Status: DC

## 2021-08-23 MED ORDER — LEVETIRACETAM IN NACL 1500 MG/100ML IV SOLN
1500.0000 mg | Freq: Once | INTRAVENOUS | Status: AC
  Administered 2021-08-23: 1500 mg via INTRAVENOUS
  Filled 2021-08-23: qty 100

## 2021-08-23 MED ORDER — TRAMADOL HCL 50 MG PO TABS
50.0000 mg | ORAL_TABLET | Freq: Four times a day (QID) | ORAL | Status: DC | PRN
  Administered 2021-08-23 (×2): 50 mg via ORAL
  Filled 2021-08-23 (×2): qty 1

## 2021-08-23 MED ORDER — APIXABAN 5 MG PO TABS
5.0000 mg | ORAL_TABLET | Freq: Two times a day (BID) | ORAL | Status: DC
  Administered 2021-08-24 – 2021-08-25 (×3): 5 mg via ORAL
  Filled 2021-08-23 (×3): qty 1

## 2021-08-23 MED ORDER — LACTATED RINGERS IV BOLUS
1000.0000 mL | Freq: Once | INTRAVENOUS | Status: AC
  Administered 2021-08-23: 1000 mL via INTRAVENOUS

## 2021-08-23 NOTE — ED Notes (Signed)
Acetadote Bag noted to leak after RN spiked to hang for infusion. Pharmacy called and notified of the same and new bag will be sent down for administration.

## 2021-08-23 NOTE — Progress Notes (Signed)
Patient is very upset. Patient verbalized that psychiatry was going to call Dog [employer] to get someone to pick up patient's dog cookie. Patient verbalized that she feels like "she has been lied to". Patient also verbalized that she would like to leave AMA and this RN told the patient that she can't leave since she is involuntary confined. Patient has been crying, worried about the dog.   Paged on call psychiatry MD.

## 2021-08-23 NOTE — ED Notes (Signed)
Pts IVC paperwork and other paperwork sent up with pt in manila envelope. Pts valuables remain in security office locked up. Pts belongings removed from purple zone locker 5 and sent to IP unit with pt.

## 2021-08-23 NOTE — ED Notes (Addendum)
Pt valuables (cell phone, three one-dollar bills $3 total, tablet, and laptop) provided to security. Cash and cell phone placed in envelope [id number], laptop and tablet placed in clear plastic envelope [id number].
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
## 2021-08-23 NOTE — ED Notes (Signed)
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  [phone].
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
## 2021-08-23 NOTE — ED Notes (Signed)
Pt placed on purewick by this RN and [facility] EMT. Pt more alert while being placed on purewick, able to answer simple orientation questions. GCS 14 when awake, remains confused and drowsy. Pt resting at this time.

## 2021-08-23 NOTE — ED Notes (Addendum)
Pt somnolent at this time on assessment. Respirations even and unlabored, pt protecting airway without difficulty, pt on 2L O2 via [place] and ETCO2 monitoring. VS WNL.
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
## 2021-08-23 NOTE — ED Notes (Signed)
Pt belongings inventoried by this RN and placed in ED purple zone locker #5. Pt and this RN signed inventory sheet.

## 2021-08-23 NOTE — ED Notes (Signed)
Patient assisted to bathroom by this tech

## 2021-08-23 NOTE — ED Notes (Signed)
Contacted staffing office, states no sitters available at this time.
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
## 2021-08-23 NOTE — ED Notes (Signed)
IVC paperwork in progress

## 2021-08-24 DIAGNOSIS — R4182 Altered mental status, unspecified: Secondary | ICD-10-CM

## 2021-08-24 DIAGNOSIS — T424X1A Poisoning by benzodiazepines, accidental (unintentional), initial encounter: Secondary | ICD-10-CM

## 2021-08-24 DIAGNOSIS — T50904D Poisoning by unspecified drugs, medicaments and biological substances, undetermined, subsequent encounter: Secondary | ICD-10-CM | POA: Diagnosis not present

## 2021-08-24 DIAGNOSIS — T391X2A Poisoning by 4-Aminophenol derivatives, intentional self-harm, initial encounter: Secondary | ICD-10-CM | POA: Diagnosis not present

## 2021-08-24 LAB — HEPATIC FUNCTION PANEL
ALT: 12 U/L (ref 0–44)
AST: 20 U/L (ref 15–41)
Albumin: 3.4 g/dL — ABNORMAL LOW (ref 3.5–5.0)
Alkaline Phosphatase: 59 U/L (ref 38–126)
Bilirubin, Direct: <0.1 mg/dL (ref 0.0–0.2)
Total Bilirubin: 0.6 mg/dL (ref 0.3–1.2)
Total Protein: 6.4 g/dL — ABNORMAL LOW (ref 6.5–8.1)

## 2021-08-24 LAB — BASIC METABOLIC PANEL
Anion gap: 7 (ref 5–15)
BUN: <5 mg/dL — ABNORMAL LOW (ref 6–20)
CO2: 19 mmol/L — ABNORMAL LOW (ref 22–32)
Calcium: 8.8 mg/dL — ABNORMAL LOW (ref 8.9–10.3)
Chloride: 113 mmol/L — ABNORMAL HIGH (ref 98–111)
Creatinine, Ser: 0.64 mg/dL (ref 0.44–1.00)
GFR, Estimated: >60 mL/min (ref >60)
Glucose, Bld: 93 mg/dL (ref 70–99)
Potassium: 3.7 mmol/L (ref 3.5–5.1)
Sodium: 139 mmol/L (ref 135–145)

## 2021-08-24 LAB — CBC
HCT: 31.7 % — ABNORMAL LOW (ref 36.0–46.0)
Hemoglobin: 10.7 g/dL — ABNORMAL LOW (ref 12.0–15.0)
MCH: 24.7 pg — ABNORMAL LOW (ref 26.0–34.0)
MCHC: 33.8 g/dL (ref 30.0–36.0)
MCV: 73 fL — ABNORMAL LOW (ref 80.0–100.0)
Platelets: 356 10*3/uL (ref 150–400)
RBC: 4.34 MIL/uL (ref 3.87–5.11)
RDW: 17.2 % — ABNORMAL HIGH (ref 11.5–15.5)
WBC: 6.8 10*3/uL (ref 4.0–10.5)
nRBC: 0 % (ref 0.0–0.2)

## 2021-08-24 MED ORDER — NORETHINDRONE ACETATE 5 MG PO TABS
15.0000 mg | ORAL_TABLET | Freq: Every day | ORAL | Status: DC
Start: 2021-08-24 — End: 2021-08-24

## 2021-08-24 MED ORDER — LURASIDONE HCL 20 MG PO TABS
20.0000 mg | ORAL_TABLET | Freq: Every day | ORAL | Status: DC
  Administered 2021-08-24: 20 mg via ORAL
  Filled 2021-08-24 (×2): qty 1

## 2021-08-24 MED ORDER — NORETHINDRONE ACETATE 5 MG PO TABS
5.0000 mg | ORAL_TABLET | Freq: Every day | ORAL | Status: DC
  Administered 2021-08-24 – 2021-08-25 (×2): 5 mg via ORAL
  Filled 2021-08-24 (×2): qty 1

## 2021-08-24 MED ORDER — FENTANYL CITRATE PF 50 MCG/ML IJ SOSY
12.5000 ug | PREFILLED_SYRINGE | Freq: Once | INTRAMUSCULAR | Status: AC
  Administered 2021-08-24: 12.5 ug via INTRAVENOUS
  Filled 2021-08-24 (×2): qty 1

## 2021-08-24 MED ORDER — DIPHENHYDRAMINE HCL 25 MG PO CAPS
25.0000 mg | ORAL_CAPSULE | Freq: Once | ORAL | Status: AC
  Administered 2021-08-24: 25 mg via ORAL
  Filled 2021-08-24: qty 1

## 2021-08-24 MED ORDER — OXYCODONE HCL 5 MG PO TABS
5.0000 mg | ORAL_TABLET | ORAL | Status: DC | PRN
  Administered 2021-08-24 – 2021-08-25 (×5): 5 mg via ORAL
  Filled 2021-08-24 (×5): qty 1

## 2021-08-24 MED ORDER — CALCIUM CARBONATE ANTACID 500 MG PO CHEW
1.0000 | CHEWABLE_TABLET | Freq: Three times a day (TID) | ORAL | Status: DC | PRN
  Filled 2021-08-24: qty 1

## 2021-08-24 MED ORDER — TRAMADOL HCL 50 MG PO TABS
50.0000 mg | ORAL_TABLET | Freq: Four times a day (QID) | ORAL | Status: DC | PRN
  Administered 2021-08-24 – 2021-08-25 (×3): 50 mg via ORAL
  Filled 2021-08-24 (×3): qty 1

## 2021-08-24 MED ORDER — OXYCODONE HCL 5 MG PO TABS
2.5000 mg | ORAL_TABLET | Freq: Once | ORAL | Status: AC
  Administered 2021-08-24: 2.5 mg via ORAL
  Filled 2021-08-24: qty 1

## 2021-08-24 NOTE — Consult Note (Signed)
Brief Psychiatry Consult Note  Called [employer] this morning at 8am to follow up on patient's request to have her dog [name] picked up. Staff at [employer] informed us that patient has already been in contact with them and they plan to get the dog at 9am this morning pending apartment management being available to open the elevator doors. Staff at [employer] stated that patient had left her front door unlocked for them.  Marisa Cyphers, Medical Student 08/24/2021
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
## 2021-08-24 NOTE — Progress Notes (Addendum)
Pt was accepted to [facility] tomorrow 08/25/21; Bed Assignment [place] [address]  Pt meets inpatient criteria per  Dr. Cecille Aver  Attending Physician will be Dr. Kathlene Cote  Report can be called to: [phone] or [phone]  Pt can arrive after 08/25/21 after 9:00am   Nursing notified: Domenica Reamer, [place], [place] 08/24/2021 @ 11:34 PM

## 2021-08-24 NOTE — Progress Notes (Signed)
   This patient is now deemed medically cleared and is stable for disposition in accordance with the recommendations of the psychiatry team.  Cherene Altes, MD [facility] Office  [phone]

## 2021-08-24 NOTE — Progress Notes (Addendum)
Patient was IVC'd on 8/2.CSW informed by MD patient medically cleared for dc to inpatient psych. CSW made referral for inpatient psych for patient to [place] with [facility] . CSW will continue to follow.  Patient spoke with patient at bedside. Patient reports she comes from home alone. Patient request to speak with Psychiatry again. CSW informed Psychiatry. CSW received consult for psychiatry resources for patient. CSW offered patient psychiatry and counseling resources.Patient accepted. CSW followed up on referral status of inpatient psych placement for patient.[facility] currently reviewing patients referral for inpatient psych placement.All questions answered. No further questions reported at this time.

## 2021-08-24 NOTE — Progress Notes (Signed)
Pt concerned about the possibility that she may have aspirated while she was incapacitated after her tylenol overdose. She is worried considering her PMH of PE and would like to know if they MD would be open to ordering a CXR or scan to investigate. Will pass this on to the day shift team.
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
## 2021-08-24 NOTE — Progress Notes (Signed)
MEDICATION RELATED CONSULT NOTE - FOLLOW UP   Pharmacy Consult for IV Acetylcysteine  Indication: APAP overdose  Vital Signs: Temp: 97.7 F (36.5 C) (08/03 0019) Temp Source: Oral (08/03 0019) BP: 136/98 (08/03 0019) Pulse Rate: 92 (08/03 0019)   Labs: Recent Labs    08/23/21 0155 08/23/21 0600 08/23/21 1405 08/23/21 2237  WBC 7.9 6.2  --   --   HGB 11.4* 10.8*  --   --   HCT 35.1* 33.3*  --   --   PLT 389 405*  --   --   CREATININE 0.76 0.72  --   --   MG 1.9 2.0  --   --   PHOS  --  3.1  --   --   ALBUMIN 3.9 3.6 3.2* 3.4*  PROT 7.1 6.3* 6.1* 6.4*  AST 17 15 13* 20  ALT 11 11 10 12   ALKPHOS 72 64 58 62  BILITOT <0.1* 0.5 0.3 0.6  BILIDIR  --   --  <0.1 <0.1  IBILI  --   --  NOT CALCULATED NOT CALCULATED     Assessment: 26 y/o F with APAP overdose, initial APAP level 218  Most recent labs 8/2 ~2230 AST: 20 ALT: 12 APAP level: undetectable   INR from AM labs: 1.2  Plan:  Per poison control:  OK to discontinue IV Acetylcysteine after 24 hours of treatment  Narda Bonds, PharmD, BCPS Clinical Pharmacist Phone: [phone]

## 2021-08-24 NOTE — Plan of Care (Signed)
  Problem: Health Behavior/Discharge Planning: Goal: Ability to manage health-related needs will improve Outcome: Progressing   Problem: Clinical Measurements: Goal: Respiratory complications will improve Outcome: Progressing Goal: Cardiovascular complication will be avoided Outcome: Progressing   Problem: Coping: Goal: Level of anxiety will decrease Outcome: Progressing

## 2021-08-25 ENCOUNTER — Encounter (HOSPITAL_COMMUNITY): Payer: Self-pay | Admitting: Internal Medicine

## 2021-08-25 DIAGNOSIS — F6089 Other specific personality disorders: Secondary | ICD-10-CM

## 2021-08-25 DIAGNOSIS — F3181 Bipolar II disorder: Secondary | ICD-10-CM | POA: Diagnosis present

## 2021-08-25 DIAGNOSIS — R4182 Altered mental status, unspecified: Secondary | ICD-10-CM

## 2021-08-25 HISTORY — DX: Other specific personality disorders: F60.89

## 2021-08-25 LAB — SARS CORONAVIRUS 2 BY RT PCR: SARS Coronavirus 2 by RT PCR: NEGATIVE

## 2021-08-25 MED ORDER — NORETHINDRONE ACETATE 5 MG PO TABS: 5.0000 mg | ORAL_TABLET | Freq: Every day | ORAL | Status: DC

## 2021-08-25 MED ORDER — APIXABAN 5 MG PO TABS: 5.0000 mg | ORAL_TABLET | Freq: Two times a day (BID) | ORAL | 2 refills | Status: DC

## 2021-08-25 MED ORDER — LURASIDONE HCL 20 MG PO TABS: 20.0000 mg | ORAL_TABLET | Freq: Every day | ORAL | Status: DC

## 2021-08-25 MED ORDER — APIXABAN 5 MG PO TABS: 5.0000 mg | ORAL_TABLET | Freq: Two times a day (BID) | ORAL | Status: DC

## 2021-08-25 MED ORDER — CALCIUM CARBONATE ANTACID 500 MG PO CHEW
1.0000 | CHEWABLE_TABLET | Freq: Three times a day (TID) | ORAL | Status: DC | PRN
Start: 2021-08-25 — End: 2021-12-21

## 2021-08-25 MED ORDER — APIXABAN 5 MG PO TABS
5.0000 mg | ORAL_TABLET | Freq: Two times a day (BID) | ORAL | Status: DC
Start: 2021-08-25 — End: 2021-08-25

## 2021-08-25 MED ORDER — NORETHINDRONE ACETATE 5 MG PO TABS: 5.0000 mg | ORAL_TABLET | Freq: Every day | ORAL | 2 refills | Status: DC
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
## 2021-08-25 NOTE — TOC Progression Note (Signed)
Transition of Care [facility]) - Progression Note    Patient Details  Name: [name] MRN: [id number] Date of Birth: Jul 29, 1995  Transition of Care [facility]) CM/SW [place], [place] Phone Number: 08/25/2021, 10:10 AM  Clinical Narrative:     CSW informed patient has inpatient psych bed at [facility].   Bed Assignment [place]  Pt meets inpatient criteria per  Dr. Cecille Aver  Attending Physician will be Dr. Kathlene Cote  Report can be called to: [phone] or [phone]  Pt can arrive after 08/25/21 after 9:00am   Expected Discharge Plan:  (Inpatient Psych) Barriers to Discharge: No Barriers Identified  Expected Discharge Plan and Services Expected Discharge Plan:  (Inpatient Psych) In-house Referral: Clinical Social Work     Living arrangements for the past 2 months: Apartment Expected Discharge Date: 08/24/21                                     Social Determinants of Health (SDOH) Interventions    Readmission Risk Interventions     No data to display

## 2021-08-25 NOTE — Progress Notes (Signed)
Report given to Supervisor Tabe. All questions and concerns addressed. Awaiting on Sheriff for pick-up. Sitter bedside.

## 2021-08-25 NOTE — TOC Transition Note (Addendum)
Transition of Care [facility]) - CM/SW Discharge Note   Patient Details  Name: Nataliyah Packham MRN: [id number] Date of Birth: Jan 20, 1996  Transition of Care [facility]) CM/SW Contact:  Milas Gain, [place] Phone Number: 08/25/2021, 10:14 AM   Clinical Narrative:     Patient will DC to: [facility]   Anticipated DC date: 08/25/2021  Family notified: [name]   Transport by: Brandon Melnick   ?  Per MD patient ready for DC to [facility] RN, patient, patient's family, and facility notified of DC. Discharge Summary and copy of IVC paperwork sent to facility. RN given number for report [phone] or [phone] Bed Assignment [facility] Attending Physician will be Dr. Kathlene Cote .Pt can arrive after 9:00am .DC packet on chart. Patient will be transported by [facility]  CSW signing off.     Final next level of care:  (Inpatient psych [facility]) Barriers to Discharge: No Barriers Identified   Patient Goals and CMS Choice     Choice offered to / list presented to : Patient  Discharge Placement                Patient to be transferred to facility by: [facility] Name of family member notified: [name] Patient and family notified of of transfer: 08/25/21  Discharge Plan and Services In-house Referral: Clinical Social Work                                   Social Determinants of Health ([place]) Interventions     Readmission Risk Interventions     No data to display

## 2021-08-31 ENCOUNTER — Other Ambulatory Visit: Payer: Self-pay

## 2021-08-31 ENCOUNTER — Emergency Department (HOSPITAL_COMMUNITY): Payer: PPO

## 2021-08-31 ENCOUNTER — Emergency Department (HOSPITAL_COMMUNITY)
Admission: EM | Admit: 2021-08-31 | Discharge: 2021-08-31 | Discharge status: Against Medical Advice | Payer: PPO | Attending: Emergency Medicine | Admitting: Emergency Medicine

## 2021-08-31 DIAGNOSIS — Z5321 Procedure and treatment not carried out due to patient leaving prior to being seen by health care provider: Secondary | ICD-10-CM | POA: Diagnosis not present

## 2021-08-31 DIAGNOSIS — R569 Unspecified convulsions: Secondary | ICD-10-CM | POA: Insufficient documentation

## 2021-08-31 LAB — COMPREHENSIVE METABOLIC PANEL
ALT: 13 U/L (ref 0–44)
AST: 15 U/L (ref 15–41)
Albumin: 4.4 g/dL (ref 3.5–5.0)
Alkaline Phosphatase: 66 U/L (ref 38–126)
Anion gap: 7 (ref 5–15)
BUN: 10 mg/dL (ref 6–20)
CO2: 23 mmol/L (ref 22–32)
Calcium: 9.4 mg/dL (ref 8.9–10.3)
Chloride: 111 mmol/L (ref 98–111)
Creatinine, Ser: 0.89 mg/dL (ref 0.44–1.00)
GFR, Estimated: >60 mL/min (ref >60)
Glucose, Bld: 97 mg/dL (ref 70–99)
Potassium: 3.7 mmol/L (ref 3.5–5.1)
Sodium: 141 mmol/L (ref 135–145)
Total Bilirubin: 0.3 mg/dL (ref 0.3–1.2)
Total Protein: 7.5 g/dL (ref 6.5–8.1)

## 2021-08-31 LAB — CBC WITH DIFFERENTIAL/PLATELET
Abs Immature Granulocytes: 0.01 10*3/uL (ref 0.00–0.07)
Basophils Absolute: 0 10*3/uL (ref 0.0–0.1)
Basophils Relative: 0 %
Eosinophils Absolute: 0.1 10*3/uL (ref 0.0–0.5)
Eosinophils Relative: 2 %
HCT: 34.7 % — ABNORMAL LOW (ref 36.0–46.0)
Hemoglobin: 11.2 g/dL — ABNORMAL LOW (ref 12.0–15.0)
Immature Granulocytes: 0 %
Lymphocytes Relative: 31 %
Lymphs Abs: 1.7 10*3/uL (ref 0.7–4.0)
MCH: 24.2 pg — ABNORMAL LOW (ref 26.0–34.0)
MCHC: 32.3 g/dL (ref 30.0–36.0)
MCV: 75.1 fL — ABNORMAL LOW (ref 80.0–100.0)
Monocytes Absolute: 0.4 10*3/uL (ref 0.1–1.0)
Monocytes Relative: 7 %
Neutro Abs: 3.3 10*3/uL (ref 1.7–7.7)
Neutrophils Relative %: 60 %
Platelets: 448 10*3/uL — ABNORMAL HIGH (ref 150–400)
RBC: 4.62 MIL/uL (ref 3.87–5.11)
RDW: 17.5 % — ABNORMAL HIGH (ref 11.5–15.5)
WBC: 5.6 10*3/uL (ref 4.0–10.5)
nRBC: 0 % (ref 0.0–0.2)

## 2021-08-31 LAB — I-STAT BETA HCG BLOOD, ED (MC, WL, AP ONLY): I-stat hCG, quantitative: <5 m[IU]/mL (ref <5)

## 2021-08-31 LAB — RAPID URINE DRUG SCREEN, HOSP PERFORMED
Amphetamines: NOT DETECTED
Barbiturates: NOT DETECTED
Benzodiazepines: POSITIVE — AB
Cocaine: NOT DETECTED
Opiates: NOT DETECTED
Tetrahydrocannabinol: NOT DETECTED

## 2021-08-31 LAB — ACETAMINOPHEN LEVEL: Acetaminophen (Tylenol), Serum: 18 ug/mL (ref 10–30)

## 2021-08-31 NOTE — ED Triage Notes (Signed)
Patient coming to ED for evaluation of seizures.  Reports "I've been having seizures all day."  States she has not had her medication filled and has missed a dose.

## 2021-08-31 NOTE — ED Provider Triage Note (Signed)
Emergency Medicine Provider Triage Evaluation Note  [name] , a 26 y.o. female  was evaluated in triage.  Pt complains of seizure.   Review of Systems  Positive: seizure Negative: Vomiting   Physical Exam  BP (!) 150/103 (BP Location: Left Arm)   Pulse (!) 113   Temp 98.2 F (36.8 C) (Oral)   Resp 17   Ht 5' 2"  (1.575 m)   Wt 99.8 kg   SpO2 100%   BMI 40.24 kg/m  Gen:   Awake, no distress  Resp:  Normal effort MSK:   Moves extremities without difficulty  Other:  RRR  Medical Decision Making  Medically screening exam initiated at 3:51 AM.  Appropriate orders placed.  [name] was informed that the remainder of the evaluation will be completed by another provider, this initial triage assessment does not replace that evaluation, and the importance of remaining in the ED until their evaluation is complete.     Cassandra Hancock, MD 08/31/21 [phone]

## 2021-08-31 NOTE — ED Notes (Signed)
Unsuccessful [place] PIV x2 in L and R AC, bloodwork obtained.

## 2021-09-01 ENCOUNTER — Other Ambulatory Visit: Payer: Self-pay

## 2021-09-01 ENCOUNTER — Encounter (HOSPITAL_BASED_OUTPATIENT_CLINIC_OR_DEPARTMENT_OTHER): Payer: Self-pay | Admitting: Emergency Medicine

## 2021-09-01 ENCOUNTER — Emergency Department (HOSPITAL_BASED_OUTPATIENT_CLINIC_OR_DEPARTMENT_OTHER)
Admission: EM | Admit: 2021-09-01 | Discharge: 2021-09-01 | Disposition: A | Discharge status: Home / Self Care | Payer: Self-pay | Attending: Emergency Medicine | Admitting: Emergency Medicine

## 2021-09-01 DIAGNOSIS — J45909 Unspecified asthma, uncomplicated: Secondary | ICD-10-CM | POA: Insufficient documentation

## 2021-09-01 DIAGNOSIS — Z7901 Long term (current) use of anticoagulants: Secondary | ICD-10-CM | POA: Insufficient documentation

## 2021-09-01 DIAGNOSIS — Z7951 Long term (current) use of inhaled steroids: Secondary | ICD-10-CM | POA: Insufficient documentation

## 2021-09-01 DIAGNOSIS — Z79899 Other long term (current) drug therapy: Secondary | ICD-10-CM | POA: Insufficient documentation

## 2021-09-01 DIAGNOSIS — R112 Nausea with vomiting, unspecified: Secondary | ICD-10-CM | POA: Insufficient documentation

## 2021-09-01 DIAGNOSIS — G40909 Epilepsy, unspecified, not intractable, without status epilepticus: Secondary | ICD-10-CM | POA: Insufficient documentation

## 2021-09-01 DIAGNOSIS — Z91010 Allergy to peanuts: Secondary | ICD-10-CM | POA: Insufficient documentation

## 2021-09-01 DIAGNOSIS — R569 Unspecified convulsions: Secondary | ICD-10-CM

## 2021-09-01 LAB — URINALYSIS, ROUTINE W REFLEX MICROSCOPIC
Bilirubin Urine: NEGATIVE
Glucose, UA: NEGATIVE mg/dL
Hgb urine dipstick: NEGATIVE
Ketones, ur: NEGATIVE mg/dL
Leukocytes,Ua: NEGATIVE
Nitrite: NEGATIVE
Protein, ur: NEGATIVE mg/dL
Specific Gravity, Urine: 1.022 (ref 1.005–1.030)
pH: 5.5 (ref 5.0–8.0)

## 2021-09-01 LAB — CBC WITH DIFFERENTIAL/PLATELET
Abs Immature Granulocytes: 0.01 10*3/uL (ref 0.00–0.07)
Basophils Absolute: 0 10*3/uL (ref 0.0–0.1)
Basophils Relative: 1 %
Eosinophils Absolute: 0.1 10*3/uL (ref 0.0–0.5)
Eosinophils Relative: 2 %
HCT: 34.8 % — ABNORMAL LOW (ref 36.0–46.0)
Hemoglobin: 11.2 g/dL — ABNORMAL LOW (ref 12.0–15.0)
Immature Granulocytes: 0 %
Lymphocytes Relative: 39 %
Lymphs Abs: 2.2 10*3/uL (ref 0.7–4.0)
MCH: 23.9 pg — ABNORMAL LOW (ref 26.0–34.0)
MCHC: 32.2 g/dL (ref 30.0–36.0)
MCV: 74.2 fL — ABNORMAL LOW (ref 80.0–100.0)
Monocytes Absolute: 0.4 10*3/uL (ref 0.1–1.0)
Monocytes Relative: 7 %
Neutro Abs: 2.9 10*3/uL (ref 1.7–7.7)
Neutrophils Relative %: 51 %
Platelets: 399 10*3/uL (ref 150–400)
RBC: 4.69 MIL/uL (ref 3.87–5.11)
RDW: 17.4 % — ABNORMAL HIGH (ref 11.5–15.5)
WBC: 5.6 10*3/uL (ref 4.0–10.5)
nRBC: 0 % (ref 0.0–0.2)

## 2021-09-01 LAB — RAPID URINE DRUG SCREEN, HOSP PERFORMED
Amphetamines: NOT DETECTED
Barbiturates: NOT DETECTED
Benzodiazepines: POSITIVE — AB
Cocaine: NOT DETECTED
Opiates: NOT DETECTED
Tetrahydrocannabinol: NOT DETECTED

## 2021-09-01 LAB — COMPREHENSIVE METABOLIC PANEL
ALT: 7 U/L (ref 0–44)
AST: 11 U/L — ABNORMAL LOW (ref 15–41)
Albumin: 4.2 g/dL (ref 3.5–5.0)
Alkaline Phosphatase: 58 U/L (ref 38–126)
Anion gap: 12 (ref 5–15)
BUN: 9 mg/dL (ref 6–20)
CO2: 21 mmol/L — ABNORMAL LOW (ref 22–32)
Calcium: 9.3 mg/dL (ref 8.9–10.3)
Chloride: 108 mmol/L (ref 98–111)
Creatinine, Ser: 0.75 mg/dL (ref 0.44–1.00)
GFR, Estimated: >60 mL/min (ref >60)
Glucose, Bld: 84 mg/dL (ref 70–99)
Potassium: 3.8 mmol/L (ref 3.5–5.1)
Sodium: 141 mmol/L (ref 135–145)
Total Bilirubin: 0.3 mg/dL (ref 0.3–1.2)
Total Protein: 7 g/dL (ref 6.5–8.1)

## 2021-09-01 LAB — ACETAMINOPHEN LEVEL: Acetaminophen (Tylenol), Serum: 16 ug/mL (ref 10–30)

## 2021-09-01 LAB — PREGNANCY, URINE: Preg Test, Ur: NEGATIVE

## 2021-09-01 LAB — ETHANOL: Alcohol, Ethyl (B): <10 mg/dL (ref <10)

## 2021-09-01 MED ORDER — LEVETIRACETAM IN NACL 1000 MG/100ML IV SOLN
1000.0000 mg | Freq: Once | INTRAVENOUS | Status: AC
  Administered 2021-09-01: 1000 mg via INTRAVENOUS
  Filled 2021-09-01: qty 100

## 2021-09-01 MED ORDER — ONDANSETRON HCL 8 MG PO TABS: 8.0000 mg | ORAL_TABLET | Freq: Three times a day (TID) | ORAL | 1 refills | Status: DC | PRN

## 2021-09-01 MED ORDER — SODIUM CHLORIDE 0.9 % IV BOLUS
1000.0000 mL | Freq: Once | INTRAVENOUS | Status: AC
  Administered 2021-09-01: 1000 mL via INTRAVENOUS

## 2021-09-01 MED ORDER — ONDANSETRON HCL 4 MG/2ML IJ SOLN
4.0000 mg | Freq: Once | INTRAMUSCULAR | Status: AC
  Administered 2021-09-01: 4 mg via INTRAVENOUS
  Filled 2021-09-01: qty 2

## 2021-09-01 MED ORDER — OXYCODONE HCL 5 MG PO TABS
5.0000 mg | ORAL_TABLET | Freq: Once | ORAL | Status: AC
  Administered 2021-09-01: 5 mg via ORAL
  Filled 2021-09-01: qty 1

## 2021-09-01 NOTE — ED Provider Notes (Signed)
[facility] EMERGENCY Provider Note: Cassandra Spurling, MD, FACEP  CSN: [id number] MRN: [id number] ARRIVAL: 09/01/21 at [place]: [id number]   CHIEF COMPLAINT  Seizures  Level 5 caveat: Altered mental status HISTORY OF PRESENT ILLNESS  09/01/21 2:53 AM [name] is a 26 y.o. female with a history of seizures currently on Keppra.  She is also on Eliquis for thromboembolic disease.  She was admitted to the hospital on 08/23/2021 after [facility] found her driving confused and with a flat tire.  She was noted to be covered in vomit.  She admitted to taking too many Tylenol PM tablets (8-9) and Xanax (15-20) "to sleep".  She was admitted to the medicine service and discharged to psychiatry for a polysubstance overdose (acetaminophen, diphenhydramine, Xanax) suspected to be a suicide attempt (she told the admitting EDP "I want to die").  Her acetaminophen level was 218 and she was started on Acetadote. Her discharge summary indicated she was to discontinue Xanax, hydroxychloroquine (at least through 08/29/2021), Keppra (seizures thought to be pseudoseizures) and zolpidem.    She was transferred to [facility] on 08/25/2021 by the sheriff's department.  She states she was treated and released.  She checked into the [facility] long ED yesterday morning about 4 AM with a complaint of seizures.  Blood work was drawn but she left without being seen.  Acetaminophen level was 0, drug screen was positive for benzodiazepines (she is prescribed Xanax), she was not pregnant, and CMET was within normal limits.  She returns this morning again complaining of increased seizures.  She states that since her Tylenol overdose (which to me she states she does not remember but thinks she took too many to self treat her endometriosis; her nurse tells me she received 3 different versions of the Tylenol overdose) she has had persistent nausea and vomiting which has prevented her from taking her usual medications  including her Keppra.  As a result she has had increased seizures.  The patient's speech is slurred and she is having difficulty answering questions and following a train of thought.  She insists she has not had any medications of any kind in over 24 hours.   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   Collagen vascular disease ([place])    Diarrhea    Patient mentions diagnosis of ulcerative colitis but it is not clear she actually has UC   Endometriosis    Lupus ([place])    Ovarian cyst    Panic disorder 05/30/2020   Pulmonary embolus ([place]) 02/06/2020   Seizures ([place])     Past Surgical History:  Procedure Laterality Date   APPENDECTOMY     CHOLECYSTECTOMY  2015   ESOPHAGOGASTRODUODENOSCOPY (EGD) WITH PROPOFOL N/A 06/18/2020   Procedure: ESOPHAGOGASTRODUODENOSCOPY (EGD) WITH PROPOFOL;  Surgeon: Doran Stabler, MD;  Location: [facility];  Service: Gastroenterology;  Laterality: N/A;   EXCISION OF ENDOMETRIOMA     Ovarian cyst removal   excision of endometriosis     FOOT FRACTURE SURGERY     w/ hardware   HERNIA REPAIR     TONSILLECTOMY      Family History  Problem Relation Age of Onset   Hypertension Mother    Hypercholesterolemia Mother    Rheum arthritis Mother    Diabetes Father    Hypertension Father    Cancer Father    Autism Brother    Cancer  Maternal Grandmother        Ovarian   Breast cancer Other        Paternal great aunt's breast cancer   Cancer Other        patneral great aunt's ovarian cancer    Social History   Tobacco Use   Smoking status: Never   Smokeless tobacco: Never  Vaping Use   Vaping Use: Never used  Substance Use Topics   Alcohol use: Never   Drug use: Never    Prior to Admission medications   Medication Sig Start Date End Date Taking? Authorizing Provider  ondansetron (ZOFRAN) 8 MG tablet Take 1 tablet (8 mg total) by mouth every 8 (eight) hours as needed  for nausea or vomiting. 09/01/21  Yes Graylyn Bunney, MD  apixaban (ELIQUIS) 5 MG TABS tablet Take 1 tablet (5 mg total) by mouth 2 (two) times daily. 08/25/21   Cherene Altes, MD  calcium carbonate (TUMS - DOSED IN MG ELEMENTAL CALCIUM) 500 MG chewable tablet Chew 1 tablet (200 mg of elemental calcium total) by mouth 3 (three) times daily as needed for indigestion or heartburn. 08/25/21   Cherene Altes, MD  lurasidone (LATUDA) 20 MG TABS tablet Take 1 tablet (20 mg total) by mouth daily with supper. 08/25/21   Cherene Altes, MD  norethindrone (AYGESTIN) 5 MG tablet Take 1 tablet (5 mg total) by mouth daily. 08/25/21   Cherene Altes, MD  dicyclomine (BENTYL) 20 MG tablet Take 1 tablet (20 mg total) by mouth 2 (two) times daily. 11/29/18 04/11/19  Muthersbaugh, Jarrett Soho, PA-C  Fluticasone Propionate HFA (FLOVENT HFA IN) Inhale into the lungs. Patient not taking: Reported on 11/14/2019  11/14/19  [provider]    Allergies Azithromycin, Peanut-containing drug products, Iodinated contrast media, Other, Amoxicillin, Gluten meal, Hydromet [hydrocodone bit-homatrop mbr], Ibuprofen, Lactose intolerance (gi), Morphine and related, and Pantoprazole   REVIEW OF SYSTEMS  Level 5 caveat   PHYSICAL EXAMINATION  Initial Vital Signs Blood pressure (!) 136/104, pulse (!) 112, temperature (!) 96.9 F (36.1 C), temperature source Oral, resp. rate 14, last menstrual period 08/21/2021, SpO2 100 %.  Examination General: Well-developed, well-nourished female in no acute distress; appearance consistent with age of record HENT: normocephalic; atraumatic Eyes: pupils equal, round and reactive to light; extraocular muscles intact Neck: supple Heart: regular rate and rhythm; tachycardia Lungs: clear to auscultation bilaterally Abdomen: soft; nondistended; nontender; bowel sounds present Extremities: No deformity; full range of motion; pulses normal Neurologic: Somnolent but arousable;  dysarthria; ataxia; noted to move all extremities Skin: Warm and dry Psychiatric: Flat affect   RESULTS  Summary of this visit's results, reviewed and interpreted by myself:   EKG Interpretation  Date/Time:    Ventricular Rate:    PR Interval:    QRS Duration:   QT Interval:    QTC Calculation:   R Axis:     Text Interpretation:         Laboratory Studies: Results for orders placed or performed during the hospital encounter of 09/01/21 (from the past 24 hour(s))  Urinalysis, Routine w reflex microscopic Urine, Clean Catch     Status: None   Collection Time: 09/01/21  2:15 AM  Result Value Ref Range   Color, Urine YELLOW YELLOW   APPearance CLEAR CLEAR   Specific Gravity, Urine 1.022 1.005 - 1.030   pH 5.5 5.0 - 8.0   Glucose, UA NEGATIVE NEGATIVE mg/dL   Hgb urine dipstick NEGATIVE NEGATIVE   Bilirubin Urine NEGATIVE  NEGATIVE   Ketones, ur NEGATIVE NEGATIVE mg/dL   Protein, ur NEGATIVE NEGATIVE mg/dL   Nitrite NEGATIVE NEGATIVE   Leukocytes,Ua NEGATIVE NEGATIVE  Rapid urine drug screen (hospital performed)     Status: Abnormal   Collection Time: 09/01/21  2:15 AM  Result Value Ref Range   Opiates NONE DETECTED NONE DETECTED   Cocaine NONE DETECTED NONE DETECTED   Benzodiazepines POSITIVE (A) NONE DETECTED   Amphetamines NONE DETECTED NONE DETECTED   Tetrahydrocannabinol NONE DETECTED NONE DETECTED   Barbiturates NONE DETECTED NONE DETECTED  Pregnancy, urine     Status: None   Collection Time: 09/01/21  2:15 AM  Result Value Ref Range   Preg Test, Ur NEGATIVE NEGATIVE  Comprehensive metabolic panel     Status: Abnormal   Collection Time: 09/01/21  4:16 AM  Result Value Ref Range   Sodium 141 135 - 145 mmol/L   Potassium 3.8 3.5 - 5.1 mmol/L   Chloride 108 98 - 111 mmol/L   CO2 21 (L) 22 - 32 mmol/L   Glucose, Bld 84 70 - 99 mg/dL   BUN 9 6 - 20 mg/dL   Creatinine, Ser 0.75 0.44 - 1.00 mg/dL   Calcium 9.3 8.9 - 10.3 mg/dL   Total Protein 7.0 6.5 - 8.1 g/dL    Albumin 4.2 3.5 - 5.0 g/dL   AST 11 (L) 15 - 41 U/L   ALT 7 0 - 44 U/L   Alkaline Phosphatase 58 38 - 126 U/L   Total Bilirubin 0.3 0.3 - 1.2 mg/dL   GFR, Estimated >60 >60 mL/min   Anion gap 12 5 - 15  CBC with Differential     Status: Abnormal   Collection Time: 09/01/21  4:16 AM  Result Value Ref Range   WBC 5.6 4.0 - 10.5 K/uL   RBC 4.69 3.87 - 5.11 MIL/uL   Hemoglobin 11.2 (L) 12.0 - 15.0 g/dL   HCT 34.8 (L) 36.0 - 46.0 %   MCV 74.2 (L) 80.0 - 100.0 fL   MCH 23.9 (L) 26.0 - 34.0 pg   MCHC 32.2 30.0 - 36.0 g/dL   RDW 17.4 (H) 11.5 - 15.5 %   Platelets 399 150 - 400 K/uL   nRBC 0.0 0.0 - 0.2 %   Neutrophils Relative % 51 %   Neutro Abs 2.9 1.7 - 7.7 K/uL   Lymphocytes Relative 39 %   Lymphs Abs 2.2 0.7 - 4.0 K/uL   Monocytes Relative 7 %   Monocytes Absolute 0.4 0.1 - 1.0 K/uL   Eosinophils Relative 2 %   Eosinophils Absolute 0.1 0.0 - 0.5 K/uL   Basophils Relative 1 %   Basophils Absolute 0.0 0.0 - 0.1 K/uL   Immature Granulocytes 0 %   Abs Immature Granulocytes 0.01 0.00 - 0.07 K/uL  Acetaminophen level     Status: None   Collection Time: 09/01/21  4:16 AM  Result Value Ref Range   Acetaminophen (Tylenol), Serum 16 10 - 30 ug/mL  Ethanol     Status: None   Collection Time: 09/01/21  4:16 AM  Result Value Ref Range   Alcohol, Ethyl (B) <10 <10 mg/dL   Imaging Studies: CT Head Wo Contrast  Result Date: 08/31/2021 CLINICAL DATA:  Seizures.  Blunt poly trauma EXAM: CT HEAD WITHOUT CONTRAST TECHNIQUE: Contiguous axial images were obtained from the base of the skull through the vertex without intravenous contrast. RADIATION DOSE REDUCTION: This exam was performed according to the departmental dose-optimization program which includes automated  exposure control, adjustment of the mA and/or kV according to patient size and/or use of iterative reconstruction technique. COMPARISON:  Eight days ago FINDINGS: Brain: No evidence of acute infarction, hemorrhage, hydrocephalus,  extra-axial collection or mass lesion/mass effect. Vascular: No hyperdense vessel or unexpected calcification. Skull: Normal. Negative for fracture or focal lesion. Sinuses/Orbits: No acute finding. IMPRESSION: Negative head CT. Electronically Signed   By: Jorje Guild M.D.   On: 08/31/2021 05:12    ED COURSE and MDM  Nursing notes, initial and subsequent vitals signs, including pulse oximetry, reviewed and interpreted by myself.  Vitals:   09/01/21 0204 09/01/21 0230 09/01/21 0245 09/01/21 0300  BP: (!) 136/104 110/80 120/81 118/88  Pulse: (!) 112 93 85 91  Resp: 14   18  Temp: (!) 96.9 F (36.1 C)     TempSrc: Oral     SpO2: 100% 97% 97% 97%   Medications  sodium chloride 0.9 % bolus 1,000 mL (1,000 mLs Intravenous New Bag/Given 09/01/21 0424)  ondansetron (ZOFRAN) injection 4 mg (4 mg Intravenous Given 09/01/21 0425)  levETIRAcetam (KEPPRA) IVPB 1000 mg/100 mL premix (0 mg Intravenous Stopped 09/01/21 0504)  oxyCODONE (Oxy IR/ROXICODONE) immediate release tablet 5 mg (5 mg Oral Given 09/01/21 0502)   4:21 AM Patient now awake and alert without dysarthria or ataxia.  She states the symptoms are typical of her after a seizure.  She denies SI.  5:13 AM Patient given IV fluids and Zofran.  She was loaded with 1 g of Keppra and advised to start taking her Keppra orally as prescribed.  Her drug screen is positive for benzodiazepines but she has a known prescription for Xanax.  She is negative for acetaminophen and her liver functions are within normal limits. She states she has restarted her Eliquis.   PROCEDURES  Procedures   ED DIAGNOSES     ICD-10-CM   1. Seizure ([place])  R56.9     2. Nausea and vomiting in adult  R11.2          Shanon Rosser, MD 09/01/21 [phone]

## 2021-09-01 NOTE — ED Notes (Signed)
Patient verbalizes understanding of discharge instructions. Opportunity for questioning and answers were provided. Armband removed by staff, pt discharged from ED. Ambulated out to lobby  

## 2021-09-01 NOTE — ED Triage Notes (Addendum)
Stomach pains, headache and increased seizures. Did not take her keppra today. Was  at [facility] today LWBS due to wait times.  Patient notes a Recent overdose on tylenol triggered worsening seizure activity Speech slurred and nodding off in triage. Denies taking anything beside her prescribed medications today

## 2021-09-02 ENCOUNTER — Other Ambulatory Visit: Payer: Self-pay

## 2021-09-02 ENCOUNTER — Emergency Department (HOSPITAL_COMMUNITY)
Admission: EM | Admit: 2021-09-02 | Discharge: 2021-09-02 | Disposition: A | Discharge status: Home / Self Care | Payer: Self-pay | Attending: Emergency Medicine | Admitting: Emergency Medicine

## 2021-09-02 ENCOUNTER — Emergency Department (HOSPITAL_COMMUNITY): Payer: Self-pay

## 2021-09-02 DIAGNOSIS — E162 Hypoglycemia, unspecified: Secondary | ICD-10-CM | POA: Insufficient documentation

## 2021-09-02 DIAGNOSIS — S0990XA Unspecified injury of head, initial encounter: Secondary | ICD-10-CM | POA: Insufficient documentation

## 2021-09-02 DIAGNOSIS — G40909 Epilepsy, unspecified, not intractable, without status epilepticus: Secondary | ICD-10-CM | POA: Insufficient documentation

## 2021-09-02 DIAGNOSIS — X58XXXA Exposure to other specified factors, initial encounter: Secondary | ICD-10-CM | POA: Insufficient documentation

## 2021-09-02 DIAGNOSIS — R569 Unspecified convulsions: Secondary | ICD-10-CM

## 2021-09-02 DIAGNOSIS — Z91010 Allergy to peanuts: Secondary | ICD-10-CM | POA: Insufficient documentation

## 2021-09-02 DIAGNOSIS — Z7901 Long term (current) use of anticoagulants: Secondary | ICD-10-CM | POA: Insufficient documentation

## 2021-09-02 LAB — CBG MONITORING, ED
Glucose-Capillary: 57 mg/dL — ABNORMAL LOW (ref 70–99)
Glucose-Capillary: 63 mg/dL — ABNORMAL LOW (ref 70–99)
Glucose-Capillary: 69 mg/dL — ABNORMAL LOW (ref 70–99)

## 2021-09-02 MED ORDER — ACETAMINOPHEN 500 MG PO TABS: 1000.0000 mg | ORAL_TABLET | Freq: Once | ORAL | Status: DC

## 2021-09-02 NOTE — ED Provider Notes (Signed)
[place] EMERGENCY DEPARTMENT Provider Note   CSN: [id number] Arrival date & time: 09/02/21  0411     History  Chief Complaint  Patient presents with   Seizures    Leatrice Clementina Mareno is a 26 y.o. female.  HPI     Home Medications Prior to Admission medications   Medication Sig Start Date End Date Taking? Authorizing Provider  apixaban (ELIQUIS) 5 MG TABS tablet Take 1 tablet (5 mg total) by mouth 2 (two) times daily. 08/25/21   Cherene Altes, MD  calcium carbonate (TUMS - DOSED IN MG ELEMENTAL CALCIUM) 500 MG chewable tablet Chew 1 tablet (200 mg of elemental calcium total) by mouth 3 (three) times daily as needed for indigestion or heartburn. 08/25/21   Cherene Altes, MD  lurasidone (LATUDA) 20 MG TABS tablet Take 1 tablet (20 mg total) by mouth daily with supper. 08/25/21   Cherene Altes, MD  norethindrone (AYGESTIN) 5 MG tablet Take 1 tablet (5 mg total) by mouth daily. 08/25/21   Cherene Altes, MD  ondansetron (ZOFRAN) 8 MG tablet Take 1 tablet (8 mg total) by mouth every 8 (eight) hours as needed for nausea or vomiting. 09/01/21   Molpus, Jenny Reichmann, MD  dicyclomine (BENTYL) 20 MG tablet Take 1 tablet (20 mg total) by mouth 2 (two) times daily. 11/29/18 04/11/19  Muthersbaugh, Jarrett Soho, PA-C  Fluticasone Propionate HFA (FLOVENT HFA IN) Inhale into the lungs. Patient not taking: Reported on 11/14/2019  11/14/19  [provider]      Allergies    Azithromycin, Peanut-containing drug products, Iodinated contrast media, Other, Amoxicillin, Gluten meal, Hydromet [hydrocodone bit-homatrop mbr], Ibuprofen, Lactose intolerance (gi), Morphine and related, and Pantoprazole    Review of Systems   Review of Systems  Physical Exam Updated Vital Signs BP 131/79   Pulse 85   Temp 98 F (36.7 C)   Resp (!) 29   LMP 08/21/2021 (Approximate)   SpO2 100%  Physical Exam  ED Results / Procedures / Treatments   Labs (all labs ordered are listed, but  only abnormal results are displayed) Labs Reviewed  CBG MONITORING, ED - Abnormal; Notable for the following components:      Result Value   Glucose-Capillary 57 (*)    All other components within normal limits  CBG MONITORING, ED - Abnormal; Notable for the following components:   Glucose-Capillary 63 (*)    All other components within normal limits    EKG None  Radiology CT Cervical Spine Wo Contrast  Result Date: 09/02/2021 CLINICAL DATA:  26 year old female status post seizure.  Pain. EXAM: CT CERVICAL SPINE WITHOUT CONTRAST TECHNIQUE: Multidetector CT imaging of the cervical spine was performed without intravenous contrast. Multiplanar CT image reconstructions were also generated. RADIATION DOSE REDUCTION: This exam was performed according to the departmental dose-optimization program which includes automated exposure control, adjustment of the mA and/or kV according to patient size and/or use of iterative reconstruction technique. COMPARISON:  Head CT today.  Cervical spine CT 06/04/2021. FINDINGS: Alignment: Chronic straightening and mild reversal of cervical lordosis. Cervicothoracic junction alignment is within normal limits. Bilateral posterior element alignment is within normal limits. Skull base and vertebrae: Visualized skull base is intact. No atlanto-occipital dissociation. C1 and C2 appear intact and aligned. No acute osseous abnormality identified. Soft tissues and spinal canal: No prevertebral fluid or swelling. No visible canal hematoma. Negative noncontrast visible neck soft tissues except for a retropharyngeal course of the left carotid variant. Disc levels:  Negative.  Upper chest: Negative. IMPRESSION: No acute traumatic injury identified in the cervical spine. Electronically Signed   By: Genevie Ann M.D.   On: 09/02/2021 06:45   CT Head Wo Contrast  Result Date: 09/02/2021 CLINICAL DATA:  26 year old female status post seizure.  Pain. EXAM: CT HEAD WITHOUT CONTRAST TECHNIQUE:  Contiguous axial images were obtained from the base of the skull through the vertex without intravenous contrast. RADIATION DOSE REDUCTION: This exam was performed according to the departmental dose-optimization program which includes automated exposure control, adjustment of the mA and/or kV according to patient size and/or use of iterative reconstruction technique. COMPARISON:  Head CT 08/31/2021. FINDINGS: Brain: No midline shift, ventriculomegaly, mass effect, evidence of mass lesion, intracranial hemorrhage or evidence of cortically based acute infarction. Gray-white matter differentiation is within normal limits throughout the brain. Vascular: No suspicious intracranial vascular hyperdensity. Skull: Stable, intact. Sinuses/Orbits: Visualized paranasal sinuses and mastoids are stable and well aerated. Other: Disconjugate gaze. Otherwise negative orbit and scalp soft tissues. IMPRESSION: Stable and normal noncontrast Head CT. Electronically Signed   By: Genevie Ann M.D.   On: 09/02/2021 06:42    Procedures Procedures    Medications Ordered in ED Medications - No data to display   ED Course/ Medical Decision Making/ A&P                           Medical Decision Making Amount and/or Complexity of Data Reviewed Radiology: ordered.  Risk OTC drugs.   Patient presents after possible seizure, does not recall details of event several woke up tired and says felt similar to last time she had a seizure.  Patient has been seen in the ER the last couple days for different types of possible seizures.  On exam patient has normal neurologic exam, smiling, full discussion without difficulty or new concerns.  Patient says she has seizure meds at home but was not taking recently.  Patient did have mild head injury and being on anticoagulant CT scan of the head and neck ordered due to pain, results independently reviewed and no fracture or bleeding.  Patient had no further seizure-like activity in the ER.   Patient requesting IV Dilaudid and Benadryl with nursing for chronic pain, I do not feel this is indicated and will not help her presentation or chronic illness.  Point-of-care glucose ordered 57, oral juice and food ordered and given.  Repeat point-of-care glucose pending.  Medical records reviewed and patient had full blood work yesterday negative pregnancy test, electrolytes unremarkable, no indication for repeating today.  Reviewed medical records independently and also with neurology given multiple visits and complicated psychiatric, nonepileptic seizure in addition to lupus and thromboembolic disease risks.  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  Recommend outpatient follow-up with primary doctor/psychiatry.  Patient's repeat point-of-care glucose in the 60s.  Plan for breakfast and ensuring normalizes prior to discharge.  Patient signed out to reassess and follow-up result.         Final Clinical Impression(s) / ED Diagnoses Final diagnoses:  Seizure-like activity ([place])  Hypoglycemia  Acute head injury, initial encounter    Rx / DC Orders ED Discharge Orders     None         Elnora Morrison, MD 09/02/21 [phone]

## 2021-09-02 NOTE — ED Notes (Signed)
RN made aware of PT's CBG. Orange juice and applesauce provided to PT

## 2021-09-02 NOTE — ED Triage Notes (Signed)
BIB [facility] after pt called to report pt having seizure. Per EMS, pt describes having a seizure last night and reports having another one this AM. Pt denies falling, pt attempted to get on ground. Pt also reports taking 8 tylenol to help control endometriosis pain today but does not recall what dose.    HX: Lupus, TIA, seizures

## 2021-09-02 NOTE — ED Provider Notes (Addendum)
Feels like was going to/or may have had a seizure. W/U negative for acute findings. Physical Exam  BP 131/79   Pulse 85   Temp 98 F (36.7 C)   Resp (!) 29   LMP 08/21/2021 (Approximate)   SpO2 100%   Physical Exam  Procedures  Procedures  ED Course / MDM    Medical Decision Making Amount and/or Complexity of Data Reviewed Radiology: ordered.  POC BS 63. Going to eat and anticipate D/C. Recheck BS.  Nurse advises that the patient has not touched or attempted to drink the juice placed at bedside.  08: 30 the patient ambulated steady gait to the bathroom.  She reported vomiting multiple times in the bathroom.  Patient reported she wanted to go home at this time.  She reports she is vomiting due to pain from her chronic endometriosis.  She reports she typically takes excessive doses of Tylenol to treat her endometriosis pain.  She reports referral to GYN in [place] and in [place] but has been told her case is too complicated for treatment.  Reports typically the nausea and vomiting flare when her endometriosis pain is flaring.  Patient reports she does have rectal Phenergan and ODT Zofran at home to take.  She reports she has had adverse effect of Reglan and does not like taking that.  At this time, with patient well in appearance, ambulatory and normal vital signs.  Patient is requesting to go home.  At this time will discharge with strong encouragement to follow-up with PCP and GYN regarding ongoing management of chronic recurrent pain from endometriosis.       Charlesetta Shanks, MD 09/02/21 [id number]    Charlesetta Shanks, MD 09/02/21 [id number]    Charlesetta Shanks, MD 09/02/21 [phone]

## 2021-09-02 NOTE — Discharge Instructions (Addendum)
1.  Follow-up with your family physician and GYN providers as soon as possible. 2.  Rest and sip fluids.  To take your nausea medications in your home pain medications on a regular schedule. 3.  Discuss pain management with your family doctor and GYN physician regarding chronic and recurrent pain from endometriosis.

## 2021-09-02 NOTE — ED Notes (Signed)
CT tech notified this RN that pt had removed purewick and cardiac monitor. Per CT tech, pt states that this RN stated that was okay. Pt not wanting monitor on at this time. EDP notified.

## 2021-09-02 NOTE — ED Notes (Signed)
Provider notified of CBG. OJ provided. Will recheck in 1 hour. Pt to CT at this time.

## 2021-09-03 ENCOUNTER — Encounter (HOSPITAL_COMMUNITY): Payer: Self-pay

## 2021-09-03 ENCOUNTER — Emergency Department (HOSPITAL_COMMUNITY)
Admission: EM | Admit: 2021-09-03 | Discharge: 2021-09-03 | Disposition: A | Discharge status: Home / Self Care | Payer: PPO | Attending: Emergency Medicine | Admitting: Emergency Medicine

## 2021-09-03 ENCOUNTER — Telehealth: Payer: PPO | Admitting: Urgent Care

## 2021-09-03 ENCOUNTER — Other Ambulatory Visit: Payer: Self-pay

## 2021-09-03 DIAGNOSIS — R1013 Epigastric pain: Secondary | ICD-10-CM | POA: Insufficient documentation

## 2021-09-03 DIAGNOSIS — G40909 Epilepsy, unspecified, not intractable, without status epilepticus: Secondary | ICD-10-CM | POA: Diagnosis not present

## 2021-09-03 DIAGNOSIS — Z7901 Long term (current) use of anticoagulants: Secondary | ICD-10-CM | POA: Insufficient documentation

## 2021-09-03 DIAGNOSIS — R7309 Other abnormal glucose: Secondary | ICD-10-CM | POA: Diagnosis not present

## 2021-09-03 DIAGNOSIS — R569 Unspecified convulsions: Secondary | ICD-10-CM | POA: Diagnosis present

## 2021-09-03 DIAGNOSIS — Z91010 Allergy to peanuts: Secondary | ICD-10-CM | POA: Diagnosis not present

## 2021-09-03 DIAGNOSIS — R112 Nausea with vomiting, unspecified: Secondary | ICD-10-CM | POA: Diagnosis not present

## 2021-09-03 LAB — CBC WITH DIFFERENTIAL/PLATELET
Abs Immature Granulocytes: 0.02 10*3/uL (ref 0.00–0.07)
Basophils Absolute: 0 10*3/uL (ref 0.0–0.1)
Basophils Relative: 1 %
Eosinophils Absolute: 0.3 10*3/uL (ref 0.0–0.5)
Eosinophils Relative: 3 %
HCT: 38.2 % (ref 36.0–46.0)
Hemoglobin: 12 g/dL (ref 12.0–15.0)
Immature Granulocytes: 0 %
Lymphocytes Relative: 46 %
Lymphs Abs: 3.5 10*3/uL (ref 0.7–4.0)
MCH: 24 pg — ABNORMAL LOW (ref 26.0–34.0)
MCHC: 31.4 g/dL (ref 30.0–36.0)
MCV: 76.2 fL — ABNORMAL LOW (ref 80.0–100.0)
Monocytes Absolute: 0.6 10*3/uL (ref 0.1–1.0)
Monocytes Relative: 9 %
Neutro Abs: 3.1 10*3/uL (ref 1.7–7.7)
Neutrophils Relative %: 41 %
Platelets: 479 10*3/uL — ABNORMAL HIGH (ref 150–400)
RBC: 5.01 MIL/uL (ref 3.87–5.11)
RDW: 17.9 % — ABNORMAL HIGH (ref 11.5–15.5)
WBC: 7.6 10*3/uL (ref 4.0–10.5)
nRBC: 0 % (ref 0.0–0.2)

## 2021-09-03 LAB — LIPASE, BLOOD: Lipase: 28 U/L (ref 11–51)

## 2021-09-03 LAB — URINALYSIS, ROUTINE W REFLEX MICROSCOPIC
Bacteria, UA: NONE SEEN
Bilirubin Urine: NEGATIVE
Glucose, UA: NEGATIVE mg/dL
Hgb urine dipstick: NEGATIVE
Ketones, ur: NEGATIVE mg/dL
Leukocytes,Ua: NEGATIVE
Nitrite: NEGATIVE
Protein, ur: 30 mg/dL — AB
Specific Gravity, Urine: 1.016 (ref 1.005–1.030)
pH: 5 (ref 5.0–8.0)

## 2021-09-03 LAB — COMPREHENSIVE METABOLIC PANEL
ALT: 12 U/L (ref 0–44)
AST: 15 U/L (ref 15–41)
Albumin: 4.3 g/dL (ref 3.5–5.0)
Alkaline Phosphatase: 67 U/L (ref 38–126)
Anion gap: 9 (ref 5–15)
BUN: 12 mg/dL (ref 6–20)
CO2: 19 mmol/L — ABNORMAL LOW (ref 22–32)
Calcium: 9.1 mg/dL (ref 8.9–10.3)
Chloride: 112 mmol/L — ABNORMAL HIGH (ref 98–111)
Creatinine, Ser: 1.25 mg/dL — ABNORMAL HIGH (ref 0.44–1.00)
GFR, Estimated: >60 mL/min (ref >60)
Glucose, Bld: 102 mg/dL — ABNORMAL HIGH (ref 70–99)
Potassium: 3.9 mmol/L (ref 3.5–5.1)
Sodium: 140 mmol/L (ref 135–145)
Total Bilirubin: 0.5 mg/dL (ref 0.3–1.2)
Total Protein: 7.2 g/dL (ref 6.5–8.1)

## 2021-09-03 LAB — CBG MONITORING, ED: Glucose-Capillary: 103 mg/dL — ABNORMAL HIGH (ref 70–99)

## 2021-09-03 LAB — PREGNANCY, URINE: Preg Test, Ur: NEGATIVE

## 2021-09-03 MED ORDER — SODIUM CHLORIDE 0.9 % IV SOLN
25.0000 mg | Freq: Once | INTRAVENOUS | Status: AC
  Administered 2021-09-03: 25 mg via INTRAVENOUS
  Filled 2021-09-03: qty 1

## 2021-09-03 MED ORDER — OXYCODONE-ACETAMINOPHEN 5-325 MG PO TABS: 1.0000 | ORAL_TABLET | Freq: Four times a day (QID) | ORAL | 0 refills | Status: DC | PRN

## 2021-09-03 MED ORDER — OXYCODONE-ACETAMINOPHEN 5-325 MG PO TABS
1.0000 | ORAL_TABLET | Freq: Once | ORAL | Status: AC
  Administered 2021-09-03: 1 via ORAL
  Filled 2021-09-03: qty 1

## 2021-09-03 MED ORDER — ONDANSETRON HCL 4 MG/2ML IJ SOLN
4.0000 mg | Freq: Once | INTRAMUSCULAR | Status: AC
  Administered 2021-09-03: 4 mg via INTRAVENOUS
  Filled 2021-09-03: qty 2

## 2021-09-03 MED ORDER — FAMOTIDINE IN NACL 20-0.9 MG/50ML-% IV SOLN
20.0000 mg | Freq: Once | INTRAVENOUS | Status: AC
  Administered 2021-09-03: 20 mg via INTRAVENOUS
  Filled 2021-09-03: qty 50

## 2021-09-03 MED ORDER — FREESTYLE LIBRE 2 SENSOR MISC: 3 refills | Status: DC

## 2021-09-03 MED ORDER — SODIUM CHLORIDE 0.9 % IV BOLUS
1000.0000 mL | Freq: Once | INTRAVENOUS | Status: AC
  Administered 2021-09-03: 1000 mL via INTRAVENOUS

## 2021-09-03 NOTE — ED Triage Notes (Signed)
Reports ~ 5 seizures within the last 2 days. Admits compliance with Keppra but says it isnt working anymore.   Says yesterday they had difficulty maintaining normal blood sugar.   Also sts generalized abdominal pian and vomiting all day.

## 2021-09-03 NOTE — ED Provider Notes (Signed)
[name] DEPT Provider Note   CSN: [id number] Arrival date & time: 09/03/21  0536     History  Chief Complaint  Patient presents with   Seizures   Abdominal Pain    [name] is a 26 y.o. female.  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  She has a complex medical history including lupus and celiac disease.  Reportedly has seizure disorder although possibly pseudoseizures.  Recent increase in her abdominal pain that she relates to her endometriosis and was admitted to medicine and then psychiatry for Tylenol overdose.  Has been here each of the last 2 days for seizure-like activity worsening abdominal pain.  It was recommended that she follow-up with her neurologist and her GYN.  Reportedly GYN will not see her due to her being too complex.  Has already had multiple head CTs in the last few days along with CT cervical spine.  Acutely she is complaining of increase in seizures, low blood sugars despite not being diabetic, severe abdominal pain.  She was told not to take Tylenol for abdominal pain so she took ibuprofen and she thinks that made things worse.  Has been vomiting.  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  She denies any suicidal ideations.  She said her blood sugars been in the 45s and 60s and she thinks that may be why she has been seizing.  She says her seizures sometimes are up somnolence although more recently they have been tonic-clonic.  The history is provided by the patient.  Seizures Seizure activity on arrival: no   Initial focality:  Unable to specify Return to baseline: yes   Context: possible hypoglycemia   Abdominal Pain Pain location:  Generalized Pain severity:  Severe Progression:  Unchanged Chronicity:  Recurrent Relieved by:  Nothing Worsened by:  Nothing Ineffective treatments:  None tried Associated symptoms: nausea and vomiting   Associated symptoms: no chest pain, no cough  and no fever        Home Medications Prior to Admission medications   Medication Sig Start Date End Date Taking? Authorizing Provider  apixaban (ELIQUIS) 5 MG TABS tablet Take 1 tablet (5 mg total) by mouth 2 (two) times daily. 08/25/21   Cherene Altes, MD  calcium carbonate (TUMS - DOSED IN MG ELEMENTAL CALCIUM) 500 MG chewable tablet Chew 1 tablet (200 mg of elemental calcium total) by mouth 3 (three) times daily as needed for indigestion or heartburn. 08/25/21   Cherene Altes, MD  lurasidone (LATUDA) 20 MG TABS tablet Take 1 tablet (20 mg total) by mouth daily with supper. 08/25/21   Cherene Altes, MD  norethindrone (AYGESTIN) 5 MG tablet Take 1 tablet (5 mg total) by mouth daily. 08/25/21   Cherene Altes, MD  ondansetron (ZOFRAN) 8 MG tablet Take 1 tablet (8 mg total) by mouth every 8 (eight) hours as needed for nausea or vomiting. 09/01/21   Molpus, Jenny Reichmann, MD  dicyclomine (BENTYL) 20 MG tablet Take 1 tablet (20 mg total) by mouth 2 (two) times daily. 11/29/18 04/11/19  Muthersbaugh, Jarrett Soho, PA-C  Fluticasone Propionate HFA (FLOVENT HFA IN) Inhale into the lungs. Patient not taking: Reported on 11/14/2019  11/14/19  [provider]      Allergies    Azithromycin, Peanut-containing drug products, Iodinated contrast media, Other, Amoxicillin, Gluten meal, Hydromet [hydrocodone bit-homatrop mbr], Ibuprofen, Lactose intolerance (gi), Morphine and related, and Pantoprazole  Review of Systems   Review of Systems  Constitutional:  Negative for fever.  Respiratory:  Negative for cough.   Cardiovascular:  Negative for chest pain.  Gastrointestinal:  Positive for abdominal pain, nausea and vomiting.  Neurological:  Positive for seizures.    Physical Exam Updated Vital Signs BP (!) 134/102   Pulse 100   Temp 98.4 F (36.9 C) (Oral)   Resp 18   LMP 08/21/2021 (Approximate)   SpO2 100%  Physical Exam Vitals and nursing note reviewed.  Constitutional:      General:  She is not in acute distress.    Appearance: She is well-developed.  HENT:     Head: Normocephalic and atraumatic.  Eyes:     Conjunctiva/sclera: Conjunctivae normal.  Cardiovascular:     Rate and Rhythm: Normal rate and regular rhythm.     Heart sounds: No murmur heard. Pulmonary:     Effort: Pulmonary effort is normal. No respiratory distress.     Breath sounds: Normal breath sounds.  Abdominal:     Palpations: Abdomen is soft.     Tenderness: There is no abdominal tenderness. There is no guarding or rebound.  Musculoskeletal:        General: No swelling.     Cervical back: Neck supple.  Skin:    General: Skin is warm and dry.     Capillary Refill: Capillary refill takes less than 2 seconds.  Neurological:     General: No focal deficit present.     Mental Status: She is alert.     ED Results / Procedures / Treatments   Labs (all labs ordered are listed, but only abnormal results are displayed) Labs Reviewed  COMPREHENSIVE METABOLIC PANEL - Abnormal; Notable for the following components:      Result Value   Chloride 112 (*)    CO2 19 (*)    Glucose, Bld 102 (*)    Creatinine, Ser 1.25 (*)    All other components within normal limits  URINALYSIS, ROUTINE W REFLEX MICROSCOPIC - Abnormal; Notable for the following components:   APPearance CLOUDY (*)    Protein, ur 30 (*)    All other components within normal limits  CBC WITH DIFFERENTIAL/PLATELET - Abnormal; Notable for the following components:   MCV 76.2 (*)    MCH 24.0 (*)    RDW 17.9 (*)    Platelets 479 (*)    All other components within normal limits  CBG MONITORING, ED - Abnormal; Notable for the following components:   Glucose-Capillary 103 (*)    All other components within normal limits  LIPASE, BLOOD  PREGNANCY, URINE    EKG None  Radiology CT Cervical Spine Wo Contrast  Result Date: 09/02/2021 CLINICAL DATA:  26 year old female status post seizure.  Pain. EXAM: CT CERVICAL SPINE WITHOUT CONTRAST  TECHNIQUE: Multidetector CT imaging of the cervical spine was performed without intravenous contrast. Multiplanar CT image reconstructions were also generated. RADIATION DOSE REDUCTION: This exam was performed according to the departmental dose-optimization program which includes automated exposure control, adjustment of the mA and/or kV according to patient size and/or use of iterative reconstruction technique. COMPARISON:  Head CT today.  Cervical spine CT 06/04/2021. FINDINGS: Alignment: Chronic straightening and mild reversal of cervical lordosis. Cervicothoracic junction alignment is within normal limits. Bilateral posterior element alignment is within normal limits. Skull base and vertebrae: Visualized skull base is intact. No atlanto-occipital dissociation. C1 and C2 appear intact and aligned. No acute osseous abnormality identified. Soft tissues and spinal  canal: No prevertebral fluid or swelling. No visible canal hematoma. Negative noncontrast visible neck soft tissues except for a retropharyngeal course of the left carotid variant. Disc levels:  Negative. Upper chest: Negative. IMPRESSION: No acute traumatic injury identified in the cervical spine. Electronically Signed   By: Genevie Ann M.D.   On: 09/02/2021 06:45   CT Head Wo Contrast  Result Date: 09/02/2021 CLINICAL DATA:  26 year old female status post seizure.  Pain. EXAM: CT HEAD WITHOUT CONTRAST TECHNIQUE: Contiguous axial images were obtained from the base of the skull through the vertex without intravenous contrast. RADIATION DOSE REDUCTION: This exam was performed according to the departmental dose-optimization program which includes automated exposure control, adjustment of the mA and/or kV according to patient size and/or use of iterative reconstruction technique. COMPARISON:  Head CT 08/31/2021. FINDINGS: Brain: No midline shift, ventriculomegaly, mass effect, evidence of mass lesion, intracranial hemorrhage or evidence of cortically based  acute infarction. Gray-white matter differentiation is within normal limits throughout the brain. Vascular: No suspicious intracranial vascular hyperdensity. Skull: Stable, intact. Sinuses/Orbits: Visualized paranasal sinuses and mastoids are stable and well aerated. Other: Disconjugate gaze. Otherwise negative orbit and scalp soft tissues. IMPRESSION: Stable and normal noncontrast Head CT. Electronically Signed   By: Genevie Ann M.D.   On: 09/02/2021 06:42    Procedures Procedures    Medications Ordered in ED Medications  famotidine (PEPCID) IVPB 20 mg premix (0 mg Intravenous Stopped 09/03/21 0824)  sodium chloride 0.9 % bolus 1,000 mL (0 mLs Intravenous Stopped 09/03/21 1010)  oxyCODONE-acetaminophen (PERCOCET/ROXICET) 5-325 MG per tablet 1 tablet (1 tablet Oral Given 09/03/21 0827)  ondansetron (ZOFRAN) injection 4 mg (4 mg Intravenous Given 09/03/21 0827)  promethazine (PHENERGAN) 25 mg in sodium chloride 0.9 % 50 mL IVPB (0 mg Intravenous Stopped 09/03/21 1003)  oxyCODONE-acetaminophen (PERCOCET/ROXICET) 5-325 MG per tablet 1 tablet (1 tablet Oral Given 09/03/21 1009)    ED Course/ Medical Decision Making/ A&P Clinical Course as of 09/03/21 1710  Sun Sep 03, 2021  1114 Patient feeling better after the IV Phenergan and held down some pain medicine.  She said she is can reach out to her gynecologist back and [place].  She is asking for prescription for some pain medicine short-term. [MB]    Clinical Course User Index [MB] Hayden Rasmussen, MD                           Medical Decision Making Amount and/or Complexity of Data Reviewed Labs: ordered.  Risk Prescription drug management.  This patient complains of seizures low blood sugar nausea vomiting abdominal pain; this involves an extensive number of treatment Options and is a complaint that carries with it a high risk of complications and morbidity. The differential includes seizures, pseudoseizures, hypoglycemia, metabolic derangement,  gastritis, endometriosis, obstruction, cholelithiasis, cholecystitis, pyelonephritis  I ordered, reviewed and interpreted labs, which included CBC with normal white count normal hemoglobin, chemistries with low bicarb elevation in creatinine, urinalysis without clear signs of infection, pregnancy test negative I ordered medication IV fluids and nausea medication, Pepcid, oral pain medication with improvement in her symptoms and reviewed PMP when indicated.  Previous records obtained and reviewed in epic including multiple recent ED visits and imaging  Cardiac monitoring reviewed, normal sinus rhythm Social determinants considered, no significant barriers Critical Interventions: None  After the interventions stated above, I reevaluated the patient and found patient to be symptomatically improved and hemodynamically stable Admission and further testing considered, no  indications for admission or further work-up at this time.  Recommended close outpatient follow-up with her treatment team.  Return instructions discussed.          Final Clinical Impression(s) / ED Diagnoses Final diagnoses:  Seizure-like activity ([facility])  Epigastric pain  Nausea and vomiting, unspecified vomiting type    Rx / DC Orders ED Discharge Orders          Ordered    oxyCODONE-acetaminophen (PERCOCET/ROXICET) 5-325 MG tablet  Every 6 hours PRN        09/03/21 1116              Hayden Rasmussen, MD 09/03/21 1712

## 2021-09-03 NOTE — ED Notes (Signed)
Pt vomited up percocet. Provider aware, alternative antiemetic ordered.

## 2021-09-03 NOTE — Patient Instructions (Signed)
[name], thank you for joining Chaney Malling, PA for today's virtual visit.  While this provider is not your primary care provider (PCP), if your PCP is located in our provider database this encounter information will be shared with them immediately following your visit.  Consent: (Patient) [name] provided verbal consent for this virtual visit at the beginning of the encounter.  Current Medications:  Current Outpatient Medications:    Continuous Blood Gluc Sensor (FREESTYLE LIBRE 2 SENSOR) MISC, Apply one sensor to arm every 14 days., Disp: 1 each, Rfl: 3   apixaban (ELIQUIS) 5 MG TABS tablet, Take 1 tablet (5 mg total) by mouth 2 (two) times daily., Disp: 60 tablet, Rfl: 2   calcium carbonate (TUMS - DOSED IN MG ELEMENTAL CALCIUM) 500 MG chewable tablet, Chew 1 tablet (200 mg of elemental calcium total) by mouth 3 (three) times daily as needed for indigestion or heartburn., Disp: , Rfl:    lurasidone (LATUDA) 20 MG TABS tablet, Take 1 tablet (20 mg total) by mouth daily with supper., Disp: 60 tablet, Rfl:    norethindrone (AYGESTIN) 5 MG tablet, Take 1 tablet (5 mg total) by mouth daily., Disp: 30 tablet, Rfl: 2   ondansetron (ZOFRAN) 8 MG tablet, Take 1 tablet (8 mg total) by mouth every 8 (eight) hours as needed for nausea or vomiting., Disp: 10 tablet, Rfl: 1   oxyCODONE-acetaminophen (PERCOCET/ROXICET) 5-325 MG tablet, Take 1 tablet by mouth every 6 (six) hours as needed for severe pain., Disp: 10 tablet, Rfl: 0   Medications ordered in this encounter:  Meds ordered this encounter  Medications   Continuous Blood Gluc Sensor (FREESTYLE LIBRE 2 SENSOR) MISC    Sig: Apply one sensor to arm every 14 days.    Dispense:  1 each    Refill:  3    Order Specific Question:   Supervising Provider    Answer:   Chase Picket [id number]     *If you need refills on other medications prior to your next appointment, please contact your pharmacy*  Follow-Up: Call back or  seek an in-person evaluation if the symptoms worsen or if the condition fails to improve as anticipated.  Other Instructions Please establish care with a rheumatologist, endocrinologist and neurologist locally to help manage your care. Use the freestyle sensor on arm, change every 14 days. Follow package instructions on proper use of the reader. Eat more high protein foods (chicken, fish, almond butter, cottage cheese, greek yogurt) to prevent sugar spikes and drops. Consider establishing with a RD to further discuss appropriate meal planning for weight loss despite your medical conditions. If any symptoms persist or worsen, please head to the ER. [place] law prohibits those with seizures from driving for 6+ months after having a seizure. Please use other modes of transportation, and always call 911 to take you to the ER if needed.    If you have been instructed to have an in-person evaluation today at a local Urgent Care facility, please use the link below. It will take you to a list of all of our available [facility], including address, phone number and hours of operation. Please do not delay care.  [facility]  If you or a family member do not have a primary care provider, use the link below to schedule a visit and establish care. When you choose a [facility] primary care physician or advanced practice provider, you gain a long-term partner in health. Find  a Primary Care Provider  Learn more about [facility] in-office and virtual care options: [facility]

## 2021-09-03 NOTE — Discharge Instructions (Addendum)
He was seen in the emergency department for low blood sugars seizure-like activity abdominal pain nausea vomiting.  Your lab work did not show an obvious explanation for your symptoms.  Please continue your regular medications.  We are prescribing a short course of pain medicine to help with your symptoms.  It will be important for you to follow-up with your treatment team.  Return to the emergency department if any worsening or concerning symptoms.

## 2021-09-03 NOTE — Progress Notes (Signed)
Virtual Visit Consent   [name], you are scheduled for a virtual visit with a [facility] provider today. Just as with appointments in the office, your consent must be obtained to participate. Your consent will be active for this visit and any virtual visit you may have with one of our providers in the next 365 days. If you have a MyChart account, a copy of this consent can be sent to you electronically.  As this is a virtual visit, video technology does not allow for your provider to perform a traditional examination. This may limit your provider's ability to fully assess your condition. If your provider identifies any concerns that need to be evaluated in person or the need to arrange testing (such as labs, EKG, etc.), we will make arrangements to do so. Although advances in technology are sophisticated, we cannot ensure that it will always work on either your end or our end. If the connection with a video visit is poor, the visit may have to be switched to a telephone visit. With either a video or telephone visit, we are not always able to ensure that we have a secure connection.  By engaging in this virtual visit, you consent to the provision of healthcare and authorize for your insurance to be billed (if applicable) for the services provided during this visit. Depending on your insurance coverage, you may receive a charge related to this service.  I need to obtain your verbal consent now. Are you willing to proceed with your visit today? [name] has provided verbal consent on 09/03/2021 for a virtual visit (video or telephone). Chaney Malling, PA  Date: 09/03/2021 5:28 PM  Virtual Visit via Video Note   I, [place], connected with  [name]  ([id number], 1995-08-21) on 09/03/21 at  5:15 PM EDT by a video-enabled telemedicine application and verified that I am speaking with the correct person using two identifiers.  Location: Patient: Virtual Visit Location  Patient: Home Provider: Virtual Visit Location Provider: Home Office   I discussed the limitations of evaluation and management by telemedicine and the availability of in person appointments. The patient expressed understanding and agreed to proceed.    History of Present Illness: [name] is a 26 y.o. female who is being seen today primarily to request a freestyle 2 Libre sensor.  HPI: Pleasant 26yo with a PMH of Lupus, seizures, celiac disease and hypoglycemia presents for a video visit to request sensors. She was seen in the ER this morning after having what she believes to be a hypoglycemic seizure. She has never been dx with DM, but has been trying to lose weight and has been eating low calorie foods and primarily vegetables. Sugars have been in the 40-50s, which she states induces her seizures. She also believes her lupus is affecting her liver. She used to be on plaquenil, but ran out several months ago and feels she has gotten more sx since then. She is originally from [place], states her neurologist is in [place]. Does not have endocrine, neurology or rheumatology in [place]. She was discharged from the hospital with a script for the Freestyle 2 Libre glucose meter, however they failed to send in the sensors. Pt is currently sitting at [facility] and is requesting a script for this so she can start monitoring.   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   Panic disorder 05/30/2020   Major depressive disorder 05/29/2020   Suicide attempt by acetaminophen overdose ([place]) 05/29/2020   Obesity, Class III, BMI 40-49.9  (morbid obesity) ([place]) 04/05/2020   SOB (shortness of breath) 04/05/2020   Abdominal pain 04/04/2020   SLE (systemic lupus erythematosus related syndrome) ([place]) 04/04/2020   Shortness of breath 04/04/2020   Pneumonia 03/23/2020   Nausea 03/23/2020   Asthma 03/22/2020   Dysuria 02/03/2020   Hematuria 02/03/2020   Mass of urinary bladder 01/26/2020   Allergic rhinitis 05/05/2019   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   Endometriosis 03/14/2018   Long-term use of high-risk medication 07/02/2017   Lupus anticoagulant positive 07/02/2017   Personal history of pulmonary embolism 07/02/2017   Other forms of systemic lupus erythematosus ([place]) 03/12/2017   Anemia 02/28/2017   Migraine 02/28/2017   Dysmenorrhea 08/28/2011   Gastritis 08/28/2011    Allergies:  Allergies  Allergen Reactions   Azithromycin Anaphylaxis   Peanut-Containing Drug Products Other (See Comments)    Unknown ; treenuts, shell fish Unknown reaction, took allergy test   Iodinated Contrast Media Hives and Itching    02/04/2020 Patient stated has history of itching and hives with CT IV Iodine contrast, premedicated with Benadryl 25 mg IV prior to CT IV Iodine scan today.Post CT scan patient reported itching, Benadryl 25 mg IV given, symptoms resolved, NP recommends for patient to get premedicated with Benadryl 50 mg IV prior to future CT IV Iodine scans.    Other Other (See Comments)    Moderna covid vaccine -Syncope    Amoxicillin Swelling    Hand swelling    Gluten Meal Other (See Comments)    Celiac disease   Hydromet [Hydrocodone Bit-Homatrop Mbr] Nausea And Vomiting   Ibuprofen Other (See Comments)    Currently taking Eliquis   Lactose Intolerance (Gi) Nausea And Vomiting   Morphine And Related Itching    Can be taken with Benadryl   Pantoprazole Other (See Comments)    Stomach pain   Medications:  Current Outpatient Medications:    Continuous Blood Gluc Sensor (FREESTYLE LIBRE 2 SENSOR) MISC, Apply one sensor  to arm every 14 days., Disp: 1 each, Rfl: 3   apixaban (ELIQUIS) 5 MG TABS tablet, Take 1 tablet (5 mg total) by mouth 2 (two) times daily., Disp: 60 tablet, Rfl: 2   calcium carbonate (TUMS - DOSED IN MG ELEMENTAL CALCIUM) 500 MG chewable tablet, Chew 1 tablet (200 mg of elemental calcium total) by mouth 3 (three) times daily as needed for indigestion or heartburn., Disp: , Rfl:    lurasidone (LATUDA) 20 MG TABS tablet, Take 1 tablet (20 mg total) by mouth daily with supper., Disp: 60 tablet, Rfl:    norethindrone (AYGESTIN) 5 MG tablet, Take 1 tablet (5 mg total) by mouth daily., Disp: 30 tablet, Rfl: 2   ondansetron (ZOFRAN) 8 MG tablet, Take 1 tablet (8 mg total) by mouth every 8 (eight) hours as needed for nausea or vomiting., Disp: 10 tablet, Rfl: 1   oxyCODONE-acetaminophen (PERCOCET/ROXICET) 5-325 MG tablet, Take 1 tablet by mouth every 6 (six) hours as needed for severe pain., Disp: 10 tablet, Rfl: 0  Observations/Objective: Patient is well-developed, well-nourished in no acute distress.  Resting comfortably in chair at pharmacy.  Head is normocephalic, atraumatic.  No labored breathing. No abnormal movements Speech is clear and coherent with logical content.  Patient is alert and oriented at baseline.  Lips are dry NO scleral incterus, coryza or rhinorrhea  Assessment and Plan: 1. Abnormal glucose measurement  Pt primarily needing freestyle libre 2 sensor sent to pharmacy now. She is in the process of either moving back home to [place] or getting rheumatology, endocrinology, and neurology here in [place]. Currently using [facility] for PCP, but hoping to change to [facility]. Briefly discussed her dietary intake and ways to improve it to prevent sugar spikes and hypoglycemic episodes.   Follow Up Instructions: I discussed the assessment and treatment plan with the patient. The patient was provided an opportunity to ask questions and all were answered. The patient agreed with the plan and  demonstrated an understanding of the instructions.  A copy of instructions were sent to the patient via MyChart unless otherwise noted below.   The patient was advised to call back or seek an in-person evaluation if the symptoms worsen or if the condition fails to improve as anticipated.  Time:  I spent 16 minutes with the patient via telehealth technology discussing the above problems/concerns.    [place], PA

## 2021-09-06 ENCOUNTER — Encounter (HOSPITAL_COMMUNITY): Payer: Self-pay

## 2021-09-06 ENCOUNTER — Ambulatory Visit (INDEPENDENT_AMBULATORY_CARE_PROVIDER_SITE_OTHER): Payer: PPO

## 2021-09-06 ENCOUNTER — Ambulatory Visit (HOSPITAL_COMMUNITY)
Admission: EM | Admit: 2021-09-06 | Discharge: 2021-09-06 | Disposition: A | Discharge status: Home / Self Care | Payer: PPO | Attending: Emergency Medicine | Admitting: Emergency Medicine

## 2021-09-06 ENCOUNTER — Ambulatory Visit (INDEPENDENT_AMBULATORY_CARE_PROVIDER_SITE_OTHER): Payer: Self-pay | Admitting: Podiatry

## 2021-09-06 DIAGNOSIS — R059 Cough, unspecified: Secondary | ICD-10-CM | POA: Diagnosis not present

## 2021-09-06 DIAGNOSIS — R051 Acute cough: Secondary | ICD-10-CM | POA: Diagnosis not present

## 2021-09-06 DIAGNOSIS — Z3202 Encounter for pregnancy test, result negative: Secondary | ICD-10-CM

## 2021-09-06 DIAGNOSIS — R569 Unspecified convulsions: Secondary | ICD-10-CM

## 2021-09-06 DIAGNOSIS — Z91199 Patient's noncompliance with other medical treatment and regimen due to unspecified reason: Secondary | ICD-10-CM

## 2021-09-06 LAB — POCT URINALYSIS DIPSTICK, ED / UC
Bilirubin Urine: NEGATIVE
Glucose, UA: NEGATIVE mg/dL
Hgb urine dipstick: NEGATIVE
Ketones, ur: NEGATIVE mg/dL
Leukocytes,Ua: NEGATIVE
Nitrite: NEGATIVE
Protein, ur: NEGATIVE mg/dL
Specific Gravity, Urine: 1.02 (ref 1.005–1.030)
Urobilinogen, UA: 0.2 mg/dL (ref 0.0–1.0)
pH: 5 (ref 5.0–8.0)

## 2021-09-06 LAB — POC URINE PREG, ED: Preg Test, Ur: NEGATIVE

## 2021-09-06 LAB — CBG MONITORING, ED: Glucose-Capillary: 105 mg/dL — ABNORMAL HIGH (ref 70–99)

## 2021-09-06 NOTE — Discharge Instructions (Signed)
Please try the tessalon three times daily as needed for cough.  I recommend eating small meals throughout the day to keep your blood sugars appropriate. Try sipping on juice throughout the day if this helps you.   Follow up with your primary care provider tomorrow.  Please go to the emergency department if symptoms worsen.

## 2021-09-06 NOTE — ED Provider Notes (Signed)
[facility]    CSN: [id number] Arrival date & time: 09/06/21  2010      History   Chief Complaint Chief Complaint  Patient presents with   Cough    HPI [name] is a 26 y.o. female.  Presents with 3 day history of cough. Non-productive. Some associated shortness of breath. No fever, chills, congestion, sore throat, chest pain, abdominal pain, vomiting/diarrhea.  Tried mucinex, sudafed, tessalon, promethazine. Reports negative covid test last night and Sunday.  Reports low blood sugars over the last few weeks. As low as 40s. Reports increase in seizures recently due to changes in blood sugar. She wears a glucose sensor put on by her endocrinologist. Unknown history of diabetes.   She was seen 8/11, 8/12, and 8/13 in the ED for seizures. She has an upcoming appointment with neurologist.   History of lupus. 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   Bipolar II disorder, most recent episode major depressive ([place]) 08/25/2021   Altered mental status    Overdose 08/23/2021   Intentional acetaminophen overdose ([place])    Xanax overdose    Anxiety    Hematemesis 06/17/2020   MDD (major depressive disorder), recurrent severe, 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   Panic disorder 05/30/2020   Major depressive disorder 05/29/2020   Suicide attempt by acetaminophen overdose ([place]) 05/29/2020   Obesity, Class III, BMI 40-49.9 (morbid obesity) ([place]) 04/05/2020   SOB (shortness of breath)  04/05/2020   Abdominal pain 04/04/2020   SLE (systemic lupus erythematosus related syndrome) ([place]) 04/04/2020   Shortness of breath 04/04/2020   Pneumonia 03/23/2020   Nausea 03/23/2020   Asthma 03/22/2020   Dysuria 02/03/2020   Hematuria 02/03/2020   Mass of urinary bladder 01/26/2020   Allergic rhinitis 05/05/2019   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   Endometriosis 03/14/2018   Long-term use of high-risk medication 07/02/2017   Lupus anticoagulant positive 07/02/2017   Personal history of pulmonary embolism 07/02/2017   Other forms of systemic lupus erythematosus ([place]) 03/12/2017   Anemia 02/28/2017   Migraine 02/28/2017   Dysmenorrhea 08/28/2011   Gastritis 08/28/2011    Past Surgical History:  Procedure Laterality Date   APPENDECTOMY     CHOLECYSTECTOMY  2015   ESOPHAGOGASTRODUODENOSCOPY (EGD) WITH PROPOFOL N/A 06/18/2020   Procedure: ESOPHAGOGASTRODUODENOSCOPY (EGD) WITH PROPOFOL;  Surgeon: Doran Stabler, MD;  Location: [facility];  Service: Gastroenterology;  Laterality: N/A;   EXCISION OF ENDOMETRIOMA     Ovarian cyst removal   excision of endometriosis     FOOT FRACTURE SURGERY     w/ hardware   HERNIA REPAIR     TONSILLECTOMY      OB History     Gravida  0   Para  0   Term  0   Preterm  0   AB  0   Living  0      SAB  0  IAB  0   Ectopic  0   Multiple  0   Live Births  0            Home Medications    Prior to Admission medications   Medication Sig Start Date End Date Taking? Authorizing Provider  apixaban (ELIQUIS) 5 MG TABS tablet Take 1 tablet (5 mg total) by mouth 2 (two) times daily. 08/25/21   Cherene Altes, MD  calcium carbonate (TUMS - DOSED IN MG ELEMENTAL CALCIUM) 500 MG chewable tablet Chew 1 tablet (200 mg of elemental calcium total) by mouth 3 (three) times daily as needed for indigestion or heartburn. 08/25/21   Cherene Altes, MD  Continuous Blood Gluc Sensor (FREESTYLE LIBRE 2 SENSOR) MISC Apply one sensor  to arm every 14 days. 09/03/21   Crain, Whitney L, PA  lurasidone (LATUDA) 20 MG TABS tablet Take 1 tablet (20 mg total) by mouth daily with supper. 08/25/21   Cherene Altes, MD  norethindrone (AYGESTIN) 5 MG tablet Take 1 tablet (5 mg total) by mouth daily. 08/25/21   Cherene Altes, MD  ondansetron (ZOFRAN) 8 MG tablet Take 1 tablet (8 mg total) by mouth every 8 (eight) hours as needed for nausea or vomiting. 09/01/21   Molpus, Jenny Reichmann, MD  oxyCODONE-acetaminophen (PERCOCET/ROXICET) 5-325 MG tablet Take 1 tablet by mouth every 6 (six) hours as needed for severe pain. 09/03/21   Hayden Rasmussen, MD  dicyclomine (BENTYL) 20 MG tablet Take 1 tablet (20 mg total) by mouth 2 (two) times daily. 11/29/18 04/11/19  Muthersbaugh, Jarrett Soho, PA-C  Fluticasone Propionate HFA (FLOVENT HFA IN) Inhale into the lungs. Patient not taking: Reported on 11/14/2019  11/14/19  [provider]    Family History Family History  Problem Relation Age of Onset   Hypertension Mother    Hypercholesterolemia Mother    Rheum arthritis Mother    Diabetes Father    Hypertension Father    Cancer Father    Autism Brother    Cancer Maternal Grandmother        Ovarian   Breast cancer Other        Paternal great aunt's breast cancer   Cancer Other        patneral great aunt's ovarian cancer    Social History Social History   Tobacco Use   Smoking status: Never   Smokeless tobacco: Never  Vaping Use   Vaping Use: Never used  Substance Use Topics   Alcohol use: Never   Drug use: Never     Allergies   Azithromycin, Peanut-containing drug products, Iodinated contrast media, Other, Amoxicillin, Gluten meal, Hydromet [hydrocodone bit-homatrop mbr], Ibuprofen, Lactose intolerance (gi), Morphine and related, and Pantoprazole   Review of Systems Review of Systems  Respiratory:  Positive for cough.    Per HPI  Physical Exam Triage Vital Signs ED Triage Vitals  Enc Vitals Group     BP 09/06/21 0903  (!) 137/92     Pulse Rate 09/06/21 0903 (!) 104     Resp 09/06/21 0903 18     Temp 09/06/21 0903 98.3 F (36.8 C)     Temp Source 09/06/21 0903 Oral     SpO2 09/06/21 0903 96 %     Weight --      Height --      Head Circumference --      Peak Flow --      Pain Score 09/06/21 0904 9     Pain Loc --  Pain Edu? --      Excl. in [place]? --    No data found.  Updated Vital Signs BP 116/77 (BP Location: Left Arm)   Pulse 97   Temp 98.2 F (36.8 C) (Oral)   Resp 18   LMP 08/21/2021 (Approximate)   SpO2 97%    Physical Exam Vitals and nursing note reviewed.  Constitutional:      General: She is not in acute distress.    Appearance: She is not ill-appearing or diaphoretic.     Comments: Appears well in no acute distress. Speaking normally and in full sentences.  HENT:     Mouth/Throat:     Mouth: Mucous membranes are moist.     Pharynx: Uvula midline. No posterior oropharyngeal erythema.     Tonsils: No tonsillar exudate or tonsillar abscesses.  Eyes:     Conjunctiva/sclera: Conjunctivae normal.  Cardiovascular:     Rate and Rhythm: Normal rate and regular rhythm.     Pulses: Normal pulses.     Heart sounds: Normal heart sounds.  Pulmonary:     Effort: Pulmonary effort is normal. No respiratory distress.     Breath sounds: Normal breath sounds. No wheezing, rhonchi or rales.  Abdominal:     General: Bowel sounds are normal.     Palpations: Abdomen is soft.     Tenderness: There is no abdominal tenderness.  Musculoskeletal:     Cervical back: Normal range of motion.  Lymphadenopathy:     Cervical: No cervical adenopathy.  Neurological:     Mental Status: She is alert and oriented to person, place, and time.     UC Treatments / Results  Labs (all labs ordered are listed, but only abnormal results are displayed) Labs Reviewed  CBG MONITORING, ED - Abnormal; Notable for the following components:      Result Value   Glucose-Capillary 105 (*)    All other  components within normal limits  POCT URINALYSIS DIPSTICK, ED / UC  POC URINE PREG, ED    EKG   Radiology DG Chest 2 View  Result Date: 09/06/2021 CLINICAL DATA:  26 year old female with cough for 3 days, nonproductive. EXAM: CHEST - 2 VIEW COMPARISON:  Chest radiographs 04/17/2021 and earlier. FINDINGS: Lung volumes and mediastinal contours remain within normal limits. Visualized tracheal air column is within normal limits. Lung markings are stable since 2021 and within normal limits. No pneumothorax or pleural effusion. Cholecystectomy clips. Visible bowel-gas pattern within normal limits. No osseous abnormality identified. IMPRESSION: Negative.  No cardiopulmonary abnormality. Electronically Signed   By: Genevie Ann M.D.   On: 09/06/2021 10:18    Procedures Procedures (including critical care time)  Medications Ordered in UC Medications - No data to display  Initial Impression / Assessment and Plan / UC Course  I have reviewed the triage vital signs and the nursing notes.  Pertinent labs & imaging results that were available during my care of the patient were reviewed by me and considered in my medical decision making (see chart for details).  She is well-appearing and vitals are stable. Blood glucose 103. Urinalysis unremarkable. Negative ketones. Urine preg negative. Chest xray negative.   Recommend tessalon 3x daily for the next few days. We discussed the medicine may need a few days to work. She does have a primary care appointment tomorrow. Will follow up with them regarding blood sugars, cough, seizures. Strict ED precautions. Patient agrees to plan.  Final Clinical Impressions(s) / UC Diagnoses   Final diagnoses:  Acute cough     Discharge Instructions      Please try the tessalon three times daily as needed for cough.  I recommend eating small meals throughout the day to keep your blood sugars appropriate. Try sipping on juice throughout the day if this helps you.    Follow up with your primary care provider tomorrow.  Please go to the emergency department if symptoms worsen.      ED Prescriptions   None    PDMP not reviewed this encounter.   Christie Viscomi, Vernice Jefferson 09/06/21 1047

## 2021-09-06 NOTE — Progress Notes (Signed)
Patient was no-show for appointment today 

## 2021-09-06 NOTE — ED Triage Notes (Signed)
Pt reports x3days ago she started having a cough. Non-productive. Has taken cough syrup and otc medications. Nothing has helped the cough per pt.

## 2021-09-09 ENCOUNTER — Emergency Department (HOSPITAL_COMMUNITY): Payer: Self-pay

## 2021-09-09 ENCOUNTER — Emergency Department (HOSPITAL_COMMUNITY)
Admission: EM | Admit: 2021-09-09 | Discharge: 2021-09-09 | Disposition: A | Discharge status: Home / Self Care | Payer: Self-pay | Attending: Emergency Medicine | Admitting: Emergency Medicine

## 2021-09-09 ENCOUNTER — Encounter (HOSPITAL_COMMUNITY): Payer: Self-pay | Admitting: Emergency Medicine

## 2021-09-09 DIAGNOSIS — R631 Polydipsia: Secondary | ICD-10-CM | POA: Insufficient documentation

## 2021-09-09 DIAGNOSIS — R519 Headache, unspecified: Secondary | ICD-10-CM | POA: Insufficient documentation

## 2021-09-09 DIAGNOSIS — E876 Hypokalemia: Secondary | ICD-10-CM | POA: Insufficient documentation

## 2021-09-09 LAB — CBC
HCT: 32.7 % — ABNORMAL LOW (ref 36.0–46.0)
Hemoglobin: 10.6 g/dL — ABNORMAL LOW (ref 12.0–15.0)
MCH: 24.4 pg — ABNORMAL LOW (ref 26.0–34.0)
MCHC: 32.4 g/dL (ref 30.0–36.0)
MCV: 75.2 fL — ABNORMAL LOW (ref 80.0–100.0)
Platelets: 367 10*3/uL (ref 150–400)
RBC: 4.35 MIL/uL (ref 3.87–5.11)
RDW: 18.2 % — ABNORMAL HIGH (ref 11.5–15.5)
WBC: 7.2 10*3/uL (ref 4.0–10.5)
nRBC: 0 % (ref 0.0–0.2)

## 2021-09-09 LAB — COMPREHENSIVE METABOLIC PANEL
ALT: 15 U/L (ref 0–44)
AST: 18 U/L (ref 15–41)
Albumin: 3.7 g/dL (ref 3.5–5.0)
Alkaline Phosphatase: 66 U/L (ref 38–126)
Anion gap: 8 (ref 5–15)
BUN: 5 mg/dL — ABNORMAL LOW (ref 6–20)
CO2: 22 mmol/L (ref 22–32)
Calcium: 9.1 mg/dL (ref 8.9–10.3)
Chloride: 110 mmol/L (ref 98–111)
Creatinine, Ser: 0.75 mg/dL (ref 0.44–1.00)
GFR, Estimated: >60 mL/min (ref >60)
Glucose, Bld: 100 mg/dL — ABNORMAL HIGH (ref 70–99)
Potassium: 3.2 mmol/L — ABNORMAL LOW (ref 3.5–5.1)
Sodium: 140 mmol/L (ref 135–145)
Total Bilirubin: 0.3 mg/dL (ref 0.3–1.2)
Total Protein: 6.4 g/dL — ABNORMAL LOW (ref 6.5–8.1)

## 2021-09-09 LAB — URINALYSIS, ROUTINE W REFLEX MICROSCOPIC
Bilirubin Urine: NEGATIVE
Glucose, UA: NEGATIVE mg/dL
Hgb urine dipstick: NEGATIVE
Ketones, ur: NEGATIVE mg/dL
Nitrite: NEGATIVE
Protein, ur: NEGATIVE mg/dL
Specific Gravity, Urine: <1.005 — ABNORMAL LOW (ref 1.005–1.030)
pH: 6 (ref 5.0–8.0)

## 2021-09-09 LAB — URINALYSIS, MICROSCOPIC (REFLEX): RBC / HPF: NONE SEEN RBC/hpf (ref 0–5)

## 2021-09-09 LAB — I-STAT BETA HCG BLOOD, ED (MC, WL, AP ONLY): I-stat hCG, quantitative: <5 m[IU]/mL (ref <5)

## 2021-09-09 LAB — LIPASE, BLOOD: Lipase: 31 U/L (ref 11–51)

## 2021-09-09 MED ORDER — POTASSIUM CHLORIDE CRYS ER 20 MEQ PO TBCR
20.0000 meq | EXTENDED_RELEASE_TABLET | Freq: Two times a day (BID) | ORAL | Status: DC
Start: 2021-09-09 — End: 2021-09-09
  Administered 2021-09-09: 20 meq via ORAL
  Filled 2021-09-09: qty 1

## 2021-09-09 NOTE — ED Notes (Addendum)
Pt vomited 858m

## 2021-09-09 NOTE — ED Provider Notes (Signed)
[facility] EMERGENCY DEPARTMENT Provider Note   CSN: [id number] Arrival date & time: 09/09/21  4098     History Chief Complaint  Patient presents with   Polydipsia    HPI Taylr Meuth is a 26 y.o. female presenting for feelings of increased thirst.  She states that she woke up at 2 AM with feelings of thirst.  She has proceeded to drink approximately 4 gallons of water throughout the day today and has vomited multiple times nonbilious nonbloody clear liquid.  She denies fevers or chills syncope or shortness of breath.  She is currently asymptomatic otherwise just states that she feels thirsty. This patient has a very extensive history on chart review including multiple psychiatric diseases with medication noncompliance.  She denies any suicidal homicidal ideation at this time. She endorses that she has a capillary blood glucose monitor prescribed to her by endocrine for monitoring of hypoglycemia and that her blood sugars have been between 40-100 throughout the day.  Patient's recorded medical, surgical, social, medication list and allergies were reviewed in the Snapshot window as part of the initial history.   Review of Systems   Review of Systems  Constitutional:  Negative for chills and fever.  HENT:  Negative for ear pain and sore throat.   Eyes:  Negative for pain and visual disturbance.  Respiratory:  Negative for cough and shortness of breath.   Cardiovascular:  Negative for chest pain and palpitations.  Gastrointestinal:  Negative for abdominal pain and vomiting.  Endocrine: Positive for polydipsia.  Genitourinary:  Negative for dysuria and hematuria.  Musculoskeletal:  Negative for arthralgias and back pain.  Skin:  Negative for color change and rash.  Neurological:  Negative for seizures and syncope.  All other systems reviewed and are negative.   Physical Exam Updated Vital Signs BP (!) 141/93 (BP Location: Right Arm)   Pulse (!) 107   Temp  97.6 F (36.4 C) (Oral)   Resp 16   LMP 08/21/2021 (Approximate)   SpO2 97%  Physical Exam Vitals and nursing note reviewed.  Constitutional:      General: She is not in acute distress.    Appearance: She is well-developed.  HENT:     Head: Normocephalic and atraumatic.  Eyes:     Conjunctiva/sclera: Conjunctivae normal.  Cardiovascular:     Rate and Rhythm: Normal rate and regular rhythm.     Heart sounds: No murmur heard. Pulmonary:     Effort: Pulmonary effort is normal. No respiratory distress.     Breath sounds: Normal breath sounds.  Abdominal:     General: There is no distension.     Palpations: Abdomen is soft.     Tenderness: There is no abdominal tenderness. There is no right CVA tenderness or left CVA tenderness.  Musculoskeletal:        General: No swelling or tenderness. Normal range of motion.     Cervical back: Neck supple.  Skin:    General: Skin is warm and dry.     Capillary Refill: Capillary refill takes less than 2 seconds.  Neurological:     General: No focal deficit present.     Mental Status: She is alert and oriented to person, place, and time. Mental status is at baseline.     Cranial Nerves: No cranial nerve deficit.  Psychiatric:        Mood and Affect: Mood normal.      ED Course/ Medical Decision Making/ A&P    Procedures  Procedures   Medications Ordered in ED Medications  potassium chloride SA (KLOR-CON M) CR tablet 20 mEq (has no administration in time range)    Medical Decision Making:    Domanique Huesman is a 26 y.o. female who presented to the ED today with feelings of increased thirst detailed above.     Additional history discussed with patient's family/caregivers.  External chart has been reviewed including extensive ED visits this year and recent visits to outside ED's. Patient's presentation is complicated by their history of multiple psychiatric diseases, medication noncompliance, ongoing endocrinologic evaluation for  seizure-like episodes secondary to refractory hypoglycemia.  Patient placed on continuous vitals and telemetry monitoring while in ED which was reviewed periodically.   Complete initial physical exam performed, notably the patient  was hemodynamically stable in no acute distress.  She is ambulatory to the restroom tolerating p.o. intake.      Reviewed and confirmed nursing documentation for past medical history, family history, social history.    Initial Assessment:   With the patient's presentation of polydipsia, most likely diagnosis is psychogenic component secondary to her fixation on her new capillary glucose monitor. Other diagnoses were considered including (but not limited to) metabolic disturbance, endocrinologic dysfunction, intracranial mass. These are considered less likely due to history of present illness and physical exam findings.   This is most consistent with an acute life/limb threatening illness complicated by underlying chronic conditions.  Initial Plan:  Screening labs including CBC and Metabolic panel to evaluate for infectious or metabolic etiology of disease.  Urinalysis with reflex culture ordered to evaluate for UTI or relevant urologic/nephrologic pathology.  CXR to evaluate for structural/infectious intrathoracic pathology.  EKG to evaluate for cardiac pathology. Objective evaluation as below reviewed with plan for close reassessment  Initial Study Results:   Laboratory  All laboratory results reviewed without evidence of clinically relevant pathology.   Mild hypokalemia appreciated EKG EKG was reviewed independently. Rate, rhythm, axis, intervals all examined and without medically relevant abnormality. ST segments without concerns for elevations.    Radiology  All images reviewed independently. Agree with radiology report at this time.   CT HEAD WO CONTRAST (5MM)  Result Date: 09/09/2021 CLINICAL DATA:  Head trauma, coagulopathy EXAM: CT HEAD WITHOUT  CONTRAST TECHNIQUE: Contiguous axial images were obtained from the base of the skull through the vertex without intravenous contrast. RADIATION DOSE REDUCTION: This exam was performed according to the departmental dose-optimization program which includes automated exposure control, adjustment of the mA and/or kV according to patient size and/or use of iterative reconstruction technique. COMPARISON:  09/02/2021 FINDINGS: Brain: No evidence of acute infarction, hemorrhage, hydrocephalus, extra-axial collection or mass lesion/mass effect. Vascular: No hyperdense vessel or unexpected calcification. Skull: No osseous abnormality. Sinuses/Orbits: Visualized paranasal sinuses are clear. Visualized mastoid sinuses are clear. Visualized orbits demonstrate no focal abnormality. Other: None IMPRESSION: 1. No acute intracranial pathology. Electronically Signed   By: Kathreen Devoid M.D.   On: 09/09/2021 10:32    Final Assessment and Plan:   Ultimately, patient's objective evaluation is without any focal emergency.  Vital signs within normal limits on reassessment in the room.  Patient treated with p.o. potassium replacement and instructed to limit herself to 2.5 L p.o. fluid and follow-up with subspecialty provider in and PCP within 48 hours.  Patient stable for discharge at this time ambulatory tolerating p.o. intake.     Clinical Impression:  1. Polydipsia   2. Hypokalemia      Data Unavailable   Final Clinical  Impression(s) / ED Diagnoses Final diagnoses:  Polydipsia  Hypokalemia    Rx / DC Orders ED Discharge Orders     None         Tretha Sciara, MD 09/09/21 1121

## 2021-09-09 NOTE — ED Triage Notes (Signed)
Patient states her blood sugars have been readying between 40 and 120 for the last two days. Patient denies history of diabetes, states she has been wearing a continuous glucose monitor to check her blood sugars. Patient also reports lower abdominal pain and bloating that started the same time.

## 2021-09-09 NOTE — ED Notes (Addendum)
Pt has 3 20oz water bottles that the patient is currently drinking from. Pt once again educated on the risks of drinking large amounts within a short amount of time, and the risk of her blood sugar getting lower. Pt acknowledged education and continuing to drink.

## 2021-09-09 NOTE — ED Notes (Signed)
Pt stepped outside without notifying technician. Security Nutritional therapist of pt lying on the ground outside. Pt assisted off the floor and put into a wheelchair. Pt reports that she began to feel dizzy and once she stood up, she began to feel weak and fell. Pt taken back to triage to be reassessed.

## 2021-09-09 NOTE — ED Notes (Addendum)
Pt asked technician if she could have "more water" after stating that she's drank "100 ounces, within the span of an hour". Recommended to slow down water intake, triage nurse notified.

## 2021-09-09 NOTE — Discharge Instructions (Addendum)
You were seen today for polydipsia.  This is when you drink too much water.  Your laboratory results are without any emergent etiology for your feeling of thirst.  Your labs are grossly reassuring no evidence of kidney abnormalities.  You do have a slight decrease in your potassium level likely secondary to your disproportionate water intake.  I strongly recommend you call your endocrinologist and your primary care provider to obtain follow-up within 48 hours for repeat laboratory testing.  Restrict yourself to 2.5 L of water per day until seen by them.  Return with any changes in your symptoms including fevers or chills nausea or vomiting, syncope or shortness of breath.

## 2021-09-09 NOTE — Progress Notes (Deleted)
New Patient Visit  LMP 08/21/2021 (Approximate)    Subjective:    Patient ID: [name], female    DOB: 1995/12/08, 26 y.o.   MRN: [id number]  CC: No chief complaint on file.   HPI: [name] is a 26 y.o. female presents for new patient visit to establish care.  Introduced to Designer, jewellery role and practice setting.  All questions answered.  Discussed provider/patient relationship and expectations.   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   Collagen vascular disease ([place])    Diarrhea    Patient mentions diagnosis of ulcerative colitis but it is not clear she actually has UC   Endometriosis    Lupus ([place])    Ovarian cyst    Panic disorder 05/30/2020   Pulmonary embolus ([place]) 02/06/2020   Seizures ([place])     Past Surgical History:  Procedure Laterality Date   APPENDECTOMY     CHOLECYSTECTOMY  2015   ESOPHAGOGASTRODUODENOSCOPY (EGD) WITH PROPOFOL N/A 06/18/2020   Procedure: ESOPHAGOGASTRODUODENOSCOPY (EGD) WITH PROPOFOL;  Surgeon: Doran Stabler, MD;  Location: [facility];  Service: Gastroenterology;  Laterality: N/A;   EXCISION OF ENDOMETRIOMA     Ovarian cyst removal   excision of endometriosis     FOOT FRACTURE SURGERY     w/ hardware   HERNIA REPAIR     TONSILLECTOMY      Family History  Problem Relation Age of Onset   Hypertension Mother    Hypercholesterolemia Mother    Rheum arthritis Mother    Diabetes Father    Hypertension Father    Cancer Father    Autism Brother    Cancer Maternal Grandmother        Ovarian   Breast cancer Other        Paternal great aunt's breast cancer   Cancer Other        patneral great aunt's ovarian cancer     Social History   Tobacco Use   Smoking status: Never   Smokeless tobacco: Never  Vaping Use   Vaping Use: Never used  Substance Use Topics   Alcohol use: Never   Drug use: Never    Current Outpatient  Medications on File Prior to Visit  Medication Sig Dispense Refill   apixaban (ELIQUIS) 5 MG TABS tablet Take 1 tablet (5 mg total) by mouth 2 (two) times daily. 60 tablet 2   calcium carbonate (TUMS - DOSED IN MG ELEMENTAL CALCIUM) 500 MG chewable tablet Chew 1 tablet (200 mg of elemental calcium total) by mouth 3 (three) times daily as needed for indigestion or heartburn.     Continuous Blood Gluc Sensor (FREESTYLE LIBRE 2 SENSOR) MISC Apply one sensor to arm every 14 days. 1 each 3   lurasidone (LATUDA) 20 MG TABS tablet Take 1 tablet (20 mg total) by mouth daily with supper. 60 tablet    norethindrone (AYGESTIN) 5 MG tablet Take 1 tablet (5 mg total) by mouth daily. 30 tablet 2   ondansetron (ZOFRAN) 8 MG tablet Take 1 tablet (8 mg total) by mouth every 8 (eight) hours as needed for nausea or vomiting. 10 tablet 1   oxyCODONE-acetaminophen (PERCOCET/ROXICET) 5-325 MG tablet Take 1 tablet by mouth every 6 (six) hours as needed for severe pain. 10 tablet 0   [DISCONTINUED] dicyclomine (BENTYL) 20 MG tablet Take 1 tablet (20 mg total) by mouth 2 (two)  times daily. 20 tablet 0   [DISCONTINUED] Fluticasone Propionate HFA (FLOVENT HFA IN) Inhale into the lungs. (Patient not taking: Reported on 11/14/2019)     No current facility-administered medications on file prior to visit.     Review of Systems      Objective:    LMP 08/21/2021 (Approximate)   Wt Readings from Last 3 Encounters:  08/31/21 220 lb (99.8 kg)  08/23/21 263 lb 3.7 oz (119.4 kg)  06/29/21 265 lb (120.2 kg)    BP Readings from Last 3 Encounters:  09/09/21 111/85  09/06/21 116/77  09/03/21 (!) 140/85    Physical Exam     Assessment & Plan:   Problem List Items Addressed This Visit   None    Follow up plan: No follow-ups on file.

## 2021-09-09 NOTE — ED Provider Triage Note (Signed)
Emergency Medicine Provider Triage Evaluation Note  [name] , a 26 y.o. female  was evaluated in triage.  Pt complains of hyperglycemia, nausea, vomiting, and syncopal episodes.  Patient reports that over the last few days her sugar has been ranging from 40-120.  Patient denies any known history of diabetes however states that her endocrinologist is concerned she may have diabetes.  Patient also reports that she has been having nausea and vomiting over the last few days as well.  Reports vomiting while in the waiting room.  Patient states that last night she started last night and then had another syncopal episode while sitting outside.  Patient also endorses polydipsia, generalized body aches.  Patient reports that she is on blood thinners close. Review of Systems  Positive: Nausea, vomiting, syncopal episode, generalized body aches, polydipsia Negative: Chest pain, shortness of breath, palpitations, dizziness,  Physical Exam  BP (!) 141/93 (BP Location: Right Arm)   Pulse (!) 107   Temp 97.6 F (36.4 C) (Oral)   Resp 16   LMP 08/21/2021 (Approximate)   SpO2 97%  Gen:   Awake, no distress   Resp:  Normal effort, lungs clear to auscultation bilaterally. MSK:   Moves extremities without difficulty  Other:  No midline tenderness deformity to cervical, thoracic, or lumbar spine. head is atraumatic.    Medical Decision Making  Medically screening exam initiated at 9:45 AM.  Appropriate orders placed.  [name] was informed that the remainder of the evaluation will be completed by another provider, this initial triage assessment does not replace that evaluation, and the importance of remaining in the ED until their evaluation is complete.  Due to patient being on blood thinners will obtain noncontrast head CT.  Labs were initiated when patient was first seen in triage.   [name], [place] 09/09/21 [phone]

## 2021-09-11 ENCOUNTER — Telehealth: Payer: Self-pay | Admitting: Nurse Practitioner

## 2021-09-11 ENCOUNTER — Ambulatory Visit: Payer: Self-pay | Admitting: Nurse Practitioner

## 2021-09-11 NOTE — Telephone Encounter (Signed)
8.21.23 no show letter sent

## 2021-09-11 NOTE — Telephone Encounter (Signed)
1st no show, fee waived, letter sent

## 2021-09-28 ENCOUNTER — Ambulatory Visit (HOSPITAL_COMMUNITY)
Admission: EM | Admit: 2021-09-28 | Discharge: 2021-09-28 | Disposition: A | Discharge status: Home / Self Care | Payer: PPO | Attending: Family Medicine | Admitting: Family Medicine

## 2021-09-28 ENCOUNTER — Ambulatory Visit (INDEPENDENT_AMBULATORY_CARE_PROVIDER_SITE_OTHER): Payer: PPO

## 2021-09-28 ENCOUNTER — Encounter (HOSPITAL_COMMUNITY): Payer: Self-pay | Admitting: Emergency Medicine

## 2021-09-28 DIAGNOSIS — J4521 Mild intermittent asthma with (acute) exacerbation: Secondary | ICD-10-CM

## 2021-09-28 DIAGNOSIS — U071 COVID-19: Secondary | ICD-10-CM | POA: Diagnosis not present

## 2021-09-28 MED ORDER — PREDNISONE 20 MG PO TABS: 40.0000 mg | ORAL_TABLET | Freq: Every day | ORAL | 0 refills | Status: AC

## 2021-09-28 MED ORDER — CHERATUSSIN AC 100-10 MG/5ML PO SOLN: 5.0000 mL | Freq: Four times a day (QID) | ORAL | 0 refills | Status: DC | PRN

## 2021-09-28 NOTE — Discharge Instructions (Addendum)
Your chest x-ray was clear and did not show any pneumonia or fluid  Take prednisone 20 mg--2 daily for 5 days  Take Robitussin with codeine--5 mL or 1 teaspoon orally 4 times a day as needed for cough.

## 2021-09-28 NOTE — ED Provider Notes (Addendum)
[place]    CSN: [id number] Arrival date & time: 09/28/21  4098      History   Chief Complaint Chief Complaint  Patient presents with   Covid Positive   Cough    HPI [name] is a 26 y.o. female.    Cough  Here for 9-day history of cough and congestion. Her symptoms began on August 29, and then she was tested at the [facility] where she goes to school and was positive for [place] on August 30.  It does not sound like any antivirals were offered at the time.  She is continue to have cough with some pain in her chest, and fevers to 103.  The fevers been going on the whole time that her symptoms have been going on.  She does have a lot of malaise.  She has had some posttussive emesis mainly about once to twice daily.  She has had some loose stools.  She has tried promethazine with dextromethorphan and Tessalon Perles for the cough and they have not been effective.  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   Bipolar II disorder, most recent episode major depressive ([place]) 08/25/2021   Altered mental status    Overdose 08/23/2021   Intentional acetaminophen overdose ([place])    Xanax overdose    Anxiety    Hematemesis 06/17/2020   MDD (major depressive disorder), recurrent severe, 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   Panic disorder 05/30/2020   Major depressive disorder 05/29/2020   Suicide attempt by  acetaminophen overdose ([place]) 05/29/2020   Obesity, Class III, BMI 40-49.9 (morbid obesity) ([place]) 04/05/2020   SOB (shortness of breath) 04/05/2020   Abdominal pain 04/04/2020   SLE (systemic lupus erythematosus related syndrome) ([place]) 04/04/2020   Shortness of breath 04/04/2020   Pneumonia 03/23/2020   Nausea 03/23/2020   Asthma 03/22/2020   Dysuria 02/03/2020   Hematuria 02/03/2020   Mass of urinary bladder 01/26/2020   Allergic rhinitis 05/05/2019   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   Endometriosis 03/14/2018   Long-term use of high-risk medication 07/02/2017   Lupus anticoagulant positive 07/02/2017   Personal history of pulmonary embolism 07/02/2017   Other forms of systemic lupus erythematosus ([place]) 03/12/2017   Anemia 02/28/2017   Migraine 02/28/2017   Dysmenorrhea 08/28/2011   Gastritis 08/28/2011    Past Surgical History:  Procedure Laterality Date   APPENDECTOMY     CHOLECYSTECTOMY  2015   ESOPHAGOGASTRODUODENOSCOPY (EGD) WITH PROPOFOL N/A 06/18/2020   Procedure: ESOPHAGOGASTRODUODENOSCOPY (EGD) WITH PROPOFOL;  Surgeon: Doran Stabler, MD;  Location: [facility];  Service: Gastroenterology;  Laterality: N/A;   EXCISION OF ENDOMETRIOMA     Ovarian cyst removal   excision of endometriosis     FOOT FRACTURE SURGERY     w/ hardware   HERNIA REPAIR     TONSILLECTOMY      OB  History     Gravida  0   Para  0   Term  0   Preterm  0   AB  0   Living  0      SAB  0   IAB  0   Ectopic  0   Multiple  0   Live Births  0            Home Medications    Prior to Admission medications   Medication Sig Start Date End Date Taking? Authorizing Provider  guaiFENesin-codeine (CHERATUSSIN AC) 100-10 MG/5ML syrup Take 5 mLs by mouth 4 (four) times daily as needed for cough. 09/28/21  Yes Soua Caltagirone, Gwenlyn Perking, MD  predniSONE (DELTASONE) 20 MG tablet Take 2 tablets (40 mg total) by mouth daily with breakfast for 5 days. 09/28/21 10/03/21 Yes Hillary Struss, Gwenlyn Perking,  MD  apixaban (ELIQUIS) 5 MG TABS tablet Take 1 tablet (5 mg total) by mouth 2 (two) times daily. 08/25/21   Cherene Altes, MD  calcium carbonate (TUMS - DOSED IN MG ELEMENTAL CALCIUM) 500 MG chewable tablet Chew 1 tablet (200 mg of elemental calcium total) by mouth 3 (three) times daily as needed for indigestion or heartburn. 08/25/21   Cherene Altes, MD  Continuous Blood Gluc Sensor (FREESTYLE LIBRE 2 SENSOR) MISC Apply one sensor to arm every 14 days. 09/03/21   Crain, Whitney L, PA  lurasidone (LATUDA) 20 MG TABS tablet Take 1 tablet (20 mg total) by mouth daily with supper. 08/25/21   Cherene Altes, MD  norethindrone (AYGESTIN) 5 MG tablet Take 1 tablet (5 mg total) by mouth daily. 08/25/21   Cherene Altes, MD  ondansetron (ZOFRAN) 8 MG tablet Take 1 tablet (8 mg total) by mouth every 8 (eight) hours as needed for nausea or vomiting. 09/01/21   Molpus, Jenny Reichmann, MD  oxyCODONE-acetaminophen (PERCOCET/ROXICET) 5-325 MG tablet Take 1 tablet by mouth every 6 (six) hours as needed for severe pain. 09/03/21   Hayden Rasmussen, MD  dicyclomine (BENTYL) 20 MG tablet Take 1 tablet (20 mg total) by mouth 2 (two) times daily. 11/29/18 04/11/19  Muthersbaugh, Jarrett Soho, PA-C  Fluticasone Propionate HFA (FLOVENT HFA IN) Inhale into the lungs. Patient not taking: Reported on 11/14/2019  11/14/19  [provider]    Family History Family History  Problem Relation Age of Onset   Hypertension Mother    Hypercholesterolemia Mother    Rheum arthritis Mother    Diabetes Father    Hypertension Father    Cancer Father    Autism Brother    Cancer Maternal Grandmother        Ovarian   Breast cancer Other        Paternal great aunt's breast cancer   Cancer Other        patneral great aunt's ovarian cancer    Social History Social History   Tobacco Use   Smoking status: Never   Smokeless tobacco: Never  Vaping Use   Vaping Use: Never used  Substance Use Topics   Alcohol use: Never   Drug  use: Never     Allergies   Azithromycin, Peanut-containing drug products, Iodinated contrast media, Other, Amoxicillin, Gluten meal, Hydromet [hydrocodone bit-homatrop mbr], Ibuprofen, Lactose intolerance (gi), Morphine and related, and Pantoprazole   Review of Systems Review of Systems  Respiratory:  Positive for cough.      Physical Exam Triage Vital Signs ED Triage Vitals  Enc Vitals Group     BP 09/28/21  5400 125/89     Pulse Rate 09/28/21 0948 97     Resp 09/28/21 0948 20     Temp 09/28/21 0948 98 F (36.7 C)     Temp Source 09/28/21 0948 Oral     SpO2 09/28/21 0948 97 %     Weight --      Height --      Head Circumference --      Peak Flow --      Pain Score 09/28/21 0946 8     Pain Loc --      Pain Edu? --      Excl. in [place]? --    No data found.  Updated Vital Signs BP 125/89 (BP Location: Right Arm)   Pulse 97   Temp 98 F (36.7 C) (Oral)   Resp 20   LMP 08/21/2021 (Approximate)   SpO2 97%   Visual Acuity Right Eye Distance:   Left Eye Distance:   Bilateral Distance:    Right Eye Near:   Left Eye Near:    Bilateral Near:     Physical Exam Vitals reviewed.  Constitutional:      General: She is not in acute distress.    Appearance: She is not toxic-appearing.  HENT:     Right Ear: Tympanic membrane and ear canal normal.     Left Ear: Tympanic membrane and ear canal normal.     Nose: Nose normal.     Mouth/Throat:     Mouth: Mucous membranes are moist.     Pharynx: No oropharyngeal exudate or posterior oropharyngeal erythema.  Eyes:     Extraocular Movements: Extraocular movements intact.     Conjunctiva/sclera: Conjunctivae normal.     Pupils: Pupils are equal, round, and reactive to light.  Cardiovascular:     Rate and Rhythm: Normal rate and regular rhythm.     Heart sounds: No murmur heard. Pulmonary:     Effort: Pulmonary effort is normal. No respiratory distress.     Breath sounds: No stridor. No wheezing, rhonchi or rales.   Musculoskeletal:     Cervical back: Neck supple.  Lymphadenopathy:     Cervical: No cervical adenopathy.  Skin:    Capillary Refill: Capillary refill takes less than 2 seconds.     Coloration: Skin is not jaundiced or pale.  Neurological:     General: No focal deficit present.     Mental Status: She is alert and oriented to person, place, and time.  Psychiatric:        Behavior: Behavior normal.      UC Treatments / Results  Labs (all labs ordered are listed, but only abnormal results are displayed) Labs Reviewed - No data to display  EKG   Radiology DG Chest 2 View  Result Date: 09/28/2021 CLINICAL DATA:  Cough and mucus for 8 days.  Covid positive. EXAM: CHEST - 2 VIEW COMPARISON:  09/07/2011 FINDINGS: The heart size and mediastinal contours are within normal limits. Both lungs are clear. The visualized skeletal structures are unremarkable. IMPRESSION: No active cardiopulmonary disease. Electronically Signed   By: Nolon Nations M.D.   On: 09/28/2021 10:44    Procedures Procedures (including critical care time)  Medications Ordered in UC Medications - No data to display  Initial Impression / Assessment and Plan / UC Course  I have reviewed the triage vital signs and the nursing notes.  Pertinent labs & imaging results that were available during my care of the patient were reviewed by  me and considered in my medical decision making (see chart for details).        Chest x-ray is clear.  Since she has had some tightness and had to use her inhaler and nebulizer more, I am going to treat for asthma exacerbation with some prednisone.  Also Cheratussin with codeine is sent in for her cough. Final Clinical Impressions(s) / UC Diagnoses   Final diagnoses:  COVID-19  Mild intermittent asthma with exacerbation     Discharge Instructions      Your chest x-ray was clear and did not show any pneumonia or fluid  Take prednisone 20 mg--2 daily for 5 days  Take  Robitussin with codeine--5 mL or 1 teaspoon orally 4 times a day as needed for cough.     ED Prescriptions     Medication Sig Dispense Auth. Provider   guaiFENesin-codeine (CHERATUSSIN AC) 100-10 MG/5ML syrup Take 5 mLs by mouth 4 (four) times daily as needed for cough. 120 mL Barrett Henle, MD   predniSONE (DELTASONE) 20 MG tablet Take 2 tablets (40 mg total) by mouth daily with breakfast for 5 days. 10 tablet Windy Carina Gwenlyn Perking, MD      I have reviewed the PDMP during this encounter.   Barrett Henle, MD 09/28/21 1102    Barrett Henle, MD 09/28/21 [id number]

## 2021-09-28 NOTE — ED Triage Notes (Signed)
Pt reports she tested covid+ 4 days ago. Reports cough is painful and getting up yellow-green phlegm. Using flonase and OTC cough medication as well as promethazine cough medication without relief.

## 2021-09-29 ENCOUNTER — Encounter (HOSPITAL_BASED_OUTPATIENT_CLINIC_OR_DEPARTMENT_OTHER): Payer: Self-pay

## 2021-09-29 ENCOUNTER — Emergency Department (HOSPITAL_BASED_OUTPATIENT_CLINIC_OR_DEPARTMENT_OTHER)
Admission: EM | Admit: 2021-09-29 | Discharge: 2021-09-29 | Disposition: A | Discharge status: Home / Self Care | Payer: PPO | Attending: Emergency Medicine | Admitting: Emergency Medicine

## 2021-09-29 ENCOUNTER — Other Ambulatory Visit: Payer: Self-pay

## 2021-09-29 DIAGNOSIS — Z91010 Allergy to peanuts: Secondary | ICD-10-CM | POA: Diagnosis not present

## 2021-09-29 DIAGNOSIS — Z7901 Long term (current) use of anticoagulants: Secondary | ICD-10-CM | POA: Diagnosis not present

## 2021-09-29 DIAGNOSIS — R112 Nausea with vomiting, unspecified: Secondary | ICD-10-CM | POA: Insufficient documentation

## 2021-09-29 DIAGNOSIS — M791 Myalgia, unspecified site: Secondary | ICD-10-CM | POA: Diagnosis present

## 2021-09-29 DIAGNOSIS — R059 Cough, unspecified: Secondary | ICD-10-CM | POA: Diagnosis not present

## 2021-09-29 LAB — URINALYSIS, ROUTINE W REFLEX MICROSCOPIC
Bilirubin Urine: NEGATIVE
Glucose, UA: NEGATIVE mg/dL
Hgb urine dipstick: NEGATIVE
Ketones, ur: NEGATIVE mg/dL
Nitrite: NEGATIVE
Protein, ur: NEGATIVE mg/dL
Specific Gravity, Urine: 1.027 (ref 1.005–1.030)
pH: 5 (ref 5.0–8.0)

## 2021-09-29 LAB — COMPREHENSIVE METABOLIC PANEL
ALT: 10 U/L (ref 0–44)
AST: 12 U/L — ABNORMAL LOW (ref 15–41)
Albumin: 4.7 g/dL (ref 3.5–5.0)
Alkaline Phosphatase: 66 U/L (ref 38–126)
Anion gap: 13 (ref 5–15)
BUN: 8 mg/dL (ref 6–20)
CO2: 21 mmol/L — ABNORMAL LOW (ref 22–32)
Calcium: 9.3 mg/dL (ref 8.9–10.3)
Chloride: 106 mmol/L (ref 98–111)
Creatinine, Ser: 0.78 mg/dL (ref 0.44–1.00)
GFR, Estimated: >60 mL/min (ref >60)
Glucose, Bld: 149 mg/dL — ABNORMAL HIGH (ref 70–99)
Potassium: 3.6 mmol/L (ref 3.5–5.1)
Sodium: 140 mmol/L (ref 135–145)
Total Bilirubin: 0.2 mg/dL — ABNORMAL LOW (ref 0.3–1.2)
Total Protein: 7.7 g/dL (ref 6.5–8.1)

## 2021-09-29 LAB — CBC WITH DIFFERENTIAL/PLATELET
Abs Immature Granulocytes: 0.01 10*3/uL (ref 0.00–0.07)
Basophils Absolute: 0 10*3/uL (ref 0.0–0.1)
Basophils Relative: 0 %
Eosinophils Absolute: 0 10*3/uL (ref 0.0–0.5)
Eosinophils Relative: 0 %
HCT: 36.7 % (ref 36.0–46.0)
Hemoglobin: 12 g/dL (ref 12.0–15.0)
Immature Granulocytes: 0 %
Lymphocytes Relative: 19 %
Lymphs Abs: 1.1 10*3/uL (ref 0.7–4.0)
MCH: 24.3 pg — ABNORMAL LOW (ref 26.0–34.0)
MCHC: 32.7 g/dL (ref 30.0–36.0)
MCV: 74.3 fL — ABNORMAL LOW (ref 80.0–100.0)
Monocytes Absolute: 0.1 10*3/uL (ref 0.1–1.0)
Monocytes Relative: 1 %
Neutro Abs: 4.7 10*3/uL (ref 1.7–7.7)
Neutrophils Relative %: 80 %
Platelets: 454 10*3/uL — ABNORMAL HIGH (ref 150–400)
RBC: 4.94 MIL/uL (ref 3.87–5.11)
RDW: 19.1 % — ABNORMAL HIGH (ref 11.5–15.5)
WBC: 5.9 10*3/uL (ref 4.0–10.5)
nRBC: 0 % (ref 0.0–0.2)

## 2021-09-29 LAB — LIPASE, BLOOD: Lipase: 34 U/L (ref 11–51)

## 2021-09-29 LAB — HCG, SERUM, QUALITATIVE: Preg, Serum: NEGATIVE

## 2021-09-29 MED ORDER — OXYCODONE-ACETAMINOPHEN 5-325 MG PO TABS: 1.0000 | ORAL_TABLET | Freq: Four times a day (QID) | ORAL | 0 refills | Status: DC | PRN

## 2021-09-29 MED ORDER — OXYCODONE-ACETAMINOPHEN 5-325 MG PO TABS
1.0000 | ORAL_TABLET | Freq: Once | ORAL | Status: AC
  Administered 2021-09-29: 1 via ORAL
  Filled 2021-09-29: qty 1

## 2021-09-29 MED ORDER — SODIUM CHLORIDE 0.9 % IV SOLN
25.0000 mg | Freq: Once | INTRAVENOUS | Status: AC
  Administered 2021-09-29: 25 mg via INTRAVENOUS
  Filled 2021-09-29: qty 1

## 2021-09-29 MED ORDER — PROMETHAZINE HCL 25 MG/ML IJ SOLN
INTRAMUSCULAR | Status: AC
  Filled 2021-09-29: qty 1

## 2021-09-29 MED ORDER — SODIUM CHLORIDE 0.9 % IV BOLUS
1000.0000 mL | Freq: Once | INTRAVENOUS | Status: AC
  Administered 2021-09-29: 1000 mL via INTRAVENOUS

## 2021-09-29 NOTE — ED Triage Notes (Signed)
Pt states she tested positive for COVID 1 week ago. Pt states she has continued to have emesis, body aches, fatigue since.   Last 24 hours, pt has had emesis x 3.  Endorses diarrhea yesterday.   Pt states no relief with cough syrup.

## 2021-09-29 NOTE — Discharge Instructions (Addendum)
You were seen in the emergency department for continued body aches nausea vomiting and cough after COVID infection.  Your chest x-ray did not show any pneumonia.  Your lab work was unremarkable.  Please continue your nausea medication.  Drink plenty of fluids and rest.  We are prescribing a short course of pain medicine.  Follow-up with your primary care doctor and treatment team.  Return to the emergency department if any worsening or concerning symptoms.

## 2021-09-29 NOTE — ED Provider Notes (Signed)
[facility] EMERGENCY DEPT Provider Note   CSN: [id number] Arrival date & time: 09/29/21  7124     History  Chief Complaint  Patient presents with   Emesis    [name] is a 26 y.o. female.  She is here with complaint of continued body aches nausea and vomiting.  Been going on for over a week.  She said she got COVID last week.  Cough nonproductive causing vomiting.  She went to urgent care yesterday and they did a chest x-ray that was negative, prescribed her some prednisone and guaifenesin with codeine cough medication.  She states she does not feel any better.  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  It is also flaring up her chronic endometriosis and causing more abdominal pain.  She has multiple chronic medical conditions and is frequently in the ED for same.   Emesis Severity:  Moderate Duration:  1 week Timing:  Intermittent Progression:  Unchanged Chronicity:  New Relieved by:  Nothing Worsened by:  Nothing Ineffective treatments:  Liquids Associated symptoms: abdominal pain, cough and myalgias        Home Medications Prior to Admission medications   Medication Sig Start Date End Date Taking? Authorizing Provider  apixaban (ELIQUIS) 5 MG TABS tablet Take 1 tablet (5 mg total) by mouth 2 (two) times daily. 08/25/21   Cherene Altes, MD  calcium carbonate (TUMS - DOSED IN MG ELEMENTAL CALCIUM) 500 MG chewable tablet Chew 1 tablet (200 mg of elemental calcium total) by mouth 3 (three) times daily as needed for indigestion or heartburn. 08/25/21   Cherene Altes, MD  Continuous Blood Gluc Sensor (FREESTYLE LIBRE 2 SENSOR) MISC Apply one sensor to arm every 14 days. 09/03/21   Crain, Whitney L, PA  guaiFENesin-codeine (CHERATUSSIN AC) 100-10 MG/5ML syrup Take 5 mLs by mouth 4 (four) times daily as needed for cough. 09/28/21   Barrett Henle, MD  lurasidone (LATUDA) 20 MG TABS tablet Take 1 tablet (20 mg total) by  mouth daily with supper. 08/25/21   Cherene Altes, MD  norethindrone (AYGESTIN) 5 MG tablet Take 1 tablet (5 mg total) by mouth daily. 08/25/21   Cherene Altes, MD  ondansetron (ZOFRAN) 8 MG tablet Take 1 tablet (8 mg total) by mouth every 8 (eight) hours as needed for nausea or vomiting. 09/01/21   Molpus, Jenny Reichmann, MD  oxyCODONE-acetaminophen (PERCOCET/ROXICET) 5-325 MG tablet Take 1 tablet by mouth every 6 (six) hours as needed for severe pain. 09/03/21   Hayden Rasmussen, MD  predniSONE (DELTASONE) 20 MG tablet Take 2 tablets (40 mg total) by mouth daily with breakfast for 5 days. 09/28/21 10/03/21  Barrett Henle, MD  dicyclomine (BENTYL) 20 MG tablet Take 1 tablet (20 mg total) by mouth 2 (two) times daily. 11/29/18 04/11/19  Muthersbaugh, Jarrett Soho, PA-C  Fluticasone Propionate HFA (FLOVENT HFA IN) Inhale into the lungs. Patient not taking: Reported on 11/14/2019  11/14/19  [provider]      Allergies    Azithromycin, Peanut-containing drug products, Iodinated contrast media, Other, Amoxicillin, Gluten meal, Lactose intolerance (gi), and Morphine and related    Review of Systems   Review of Systems  Constitutional:  Positive for fatigue.  Eyes:  Negative for visual disturbance.  Respiratory:  Positive for cough.   Cardiovascular:  Negative for chest pain.  Gastrointestinal:  Positive for abdominal pain, nausea and vomiting.  Genitourinary:  Negative for dysuria.  Musculoskeletal:  Positive for myalgias.    Physical Exam Updated Vital Signs BP (!) 146/80 (BP Location: Right Arm)   Pulse (!) 116   Temp 98.3 F (36.8 C) (Oral)   Resp 20   Ht 5' 2"  (1.575 m)   Wt 99.8 kg   LMP 08/20/2020 (Approximate)   SpO2 99%   BMI 40.24 kg/m  Physical Exam Vitals and nursing note reviewed.  Constitutional:      General: She is not in acute distress.    Appearance: Normal appearance. She is well-developed. She is obese.  HENT:     Head: Normocephalic and atraumatic.  Eyes:      Conjunctiva/sclera: Conjunctivae normal.  Cardiovascular:     Rate and Rhythm: Regular rhythm. Tachycardia present.     Heart sounds: No murmur heard. Pulmonary:     Effort: Pulmonary effort is normal. No respiratory distress.     Breath sounds: Normal breath sounds.  Abdominal:     Palpations: Abdomen is soft.     Tenderness: There is no abdominal tenderness. There is no guarding or rebound.  Musculoskeletal:        General: Normal range of motion.     Cervical back: Neck supple.  Skin:    General: Skin is warm and dry.     Capillary Refill: Capillary refill takes less than 2 seconds.  Neurological:     General: No focal deficit present.     Mental Status: She is alert.     ED Results / Procedures / Treatments   Labs (all labs ordered are listed, but only abnormal results are displayed) Labs Reviewed  COMPREHENSIVE METABOLIC PANEL - Abnormal; Notable for the following components:      Result Value   CO2 21 (*)    Glucose, Bld 149 (*)    AST 12 (*)    Total Bilirubin 0.2 (*)    All other components within normal limits  CBC WITH DIFFERENTIAL/PLATELET - Abnormal; Notable for the following components:   MCV 74.3 (*)    MCH 24.3 (*)    RDW 19.1 (*)    Platelets 454 (*)    All other components within normal limits  URINALYSIS, ROUTINE W REFLEX MICROSCOPIC - Abnormal; Notable for the following components:   Leukocytes,Ua SMALL (*)    Bacteria, UA RARE (*)    All other components within normal limits  LIPASE, BLOOD  HCG, SERUM, QUALITATIVE    EKG None  Radiology DG Chest 2 View  Result Date: 09/28/2021 CLINICAL DATA:  Cough and mucus for 8 days.  Covid positive. EXAM: CHEST - 2 VIEW COMPARISON:  09/07/2011 FINDINGS: The heart size and mediastinal contours are within normal limits. Both lungs are clear. The visualized skeletal structures are unremarkable. IMPRESSION: No active cardiopulmonary disease. Electronically Signed   By: Nolon Nations M.D.   On: 09/28/2021  10:44    Procedures Procedures    Medications Ordered in ED Medications  sodium chloride 0.9 % bolus 1,000 mL (0 mLs Intravenous Stopped 09/29/21 1049)  promethazine (PHENERGAN) 25 mg in sodium chloride 0.9 % 50 mL IVPB (0 mg Intravenous Stopped 09/29/21 0900)  oxyCODONE-acetaminophen (PERCOCET/ROXICET) 5-325 MG per tablet 1 tablet (1 tablet Oral Given 09/29/21 1048)    ED Course/ Medical Decision Making/ A&P Clinical Course as of 09/29/21 1702  Fri Sep 29, 2021  0934 Patient's lab work is fairly unremarkable.  Mildly low bicarb normal renal function normal white count urinalysis without signs of infection pregnancy test negative. [MB]  [id number] Patient  had a chest x-ray done yesterday which I reviewed and I agree with radiology findings of no acute infiltrates. [MB]    Clinical Course User Index [MB] Hayden Rasmussen, MD                           Medical Decision Making Amount and/or Complexity of Data Reviewed Labs: ordered.  Risk Prescription drug management.  [name] was evaluated in Emergency Department on 09/29/2021 for the symptoms described in the history of present illness. She was evaluated in the context of the global COVID-19 pandemic, which necessitated consideration that the patient might be at risk for infection with the SARS-CoV-2 virus that causes COVID-19. Institutional protocols and algorithms that pertain to the evaluation of patients at risk for COVID-19 are in a state of rapid change based on information released by regulatory bodies including the CDC and federal and state organizations. These policies and algorithms were followed during the patient's care in the ED.  This patient complains of body aches nausea vomiting cough; this involves an extensive number of treatment Options and is a complaint that carries with it a high risk of complications and morbidity. The differential includes COVID, viral syndrome, gastroenteritis, gastritis, dehydration  I  ordered, reviewed and interpreted labs, which included CBC with normal white count normal hemoglobin, chemistries with mildly low bicarb, LFTs normal, urinalysis without signs of infection, pregnancy test negative I ordered medication IV fluids and Phenergan, oral oxycodone and reviewed PMP when indicated.  Previous records obtained and reviewed in epic including recent ED visits Cardiac monitoring reviewed, sinus tachycardia Social determinants considered, no significant barriers Critical Interventions: None  After the interventions stated above, I reevaluated the patient and found patient have soft abdomen and improved symptoms Admission and further testing considered, no indications for admission or further work-up at this time.  Patient asking for pain medicine for her myalgias.  Due to her being on anticoagulation cannot tolerate NSAIDs and cannot use Tylenol due to history of overdose.          Final Clinical Impression(s) / ED Diagnoses Final diagnoses:  Myalgia  Nausea and vomiting, unspecified vomiting type    Rx / DC Orders ED Discharge Orders          Ordered    oxyCODONE-acetaminophen (PERCOCET/ROXICET) 5-325 MG tablet  Every 6 hours PRN        09/29/21 1035              Hayden Rasmussen, MD 09/29/21 1704

## 2021-10-02 ENCOUNTER — Other Ambulatory Visit: Payer: Self-pay

## 2021-10-02 ENCOUNTER — Emergency Department (HOSPITAL_COMMUNITY): Payer: PPO

## 2021-10-02 ENCOUNTER — Encounter (HOSPITAL_COMMUNITY): Payer: Self-pay

## 2021-10-02 ENCOUNTER — Emergency Department (HOSPITAL_COMMUNITY)
Admission: EM | Admit: 2021-10-02 | Discharge: 2021-10-02 | Disposition: A | Discharge status: Home / Self Care | Payer: PPO | Attending: Emergency Medicine | Admitting: Emergency Medicine

## 2021-10-02 DIAGNOSIS — R000 Tachycardia, unspecified: Secondary | ICD-10-CM | POA: Diagnosis not present

## 2021-10-02 DIAGNOSIS — Z91010 Allergy to peanuts: Secondary | ICD-10-CM | POA: Insufficient documentation

## 2021-10-02 DIAGNOSIS — Z7901 Long term (current) use of anticoagulants: Secondary | ICD-10-CM | POA: Insufficient documentation

## 2021-10-02 DIAGNOSIS — R5383 Other fatigue: Secondary | ICD-10-CM | POA: Insufficient documentation

## 2021-10-02 DIAGNOSIS — R197 Diarrhea, unspecified: Secondary | ICD-10-CM | POA: Diagnosis not present

## 2021-10-02 DIAGNOSIS — M791 Myalgia, unspecified site: Secondary | ICD-10-CM | POA: Diagnosis not present

## 2021-10-02 DIAGNOSIS — R112 Nausea with vomiting, unspecified: Secondary | ICD-10-CM | POA: Insufficient documentation

## 2021-10-02 LAB — CBC
HCT: 36.2 % (ref 36.0–46.0)
Hemoglobin: 11.6 g/dL — ABNORMAL LOW (ref 12.0–15.0)
MCH: 23.7 pg — ABNORMAL LOW (ref 26.0–34.0)
MCHC: 32 g/dL (ref 30.0–36.0)
MCV: 74 fL — ABNORMAL LOW (ref 80.0–100.0)
Platelets: 454 10*3/uL — ABNORMAL HIGH (ref 150–400)
RBC: 4.89 MIL/uL (ref 3.87–5.11)
RDW: 17.8 % — ABNORMAL HIGH (ref 11.5–15.5)
WBC: 8.7 10*3/uL (ref 4.0–10.5)
nRBC: 0 % (ref 0.0–0.2)

## 2021-10-02 LAB — COMPREHENSIVE METABOLIC PANEL
ALT: 17 U/L (ref 0–44)
AST: 19 U/L (ref 15–41)
Albumin: 4.3 g/dL (ref 3.5–5.0)
Alkaline Phosphatase: 83 U/L (ref 38–126)
Anion gap: 8 (ref 5–15)
BUN: 13 mg/dL (ref 6–20)
CO2: 21 mmol/L — ABNORMAL LOW (ref 22–32)
Calcium: 10.2 mg/dL (ref 8.9–10.3)
Chloride: 113 mmol/L — ABNORMAL HIGH (ref 98–111)
Creatinine, Ser: 0.75 mg/dL (ref 0.44–1.00)
GFR, Estimated: >60 mL/min (ref >60)
Glucose, Bld: 163 mg/dL — ABNORMAL HIGH (ref 70–99)
Potassium: 3.8 mmol/L (ref 3.5–5.1)
Sodium: 142 mmol/L (ref 135–145)
Total Bilirubin: 0.3 mg/dL (ref 0.3–1.2)
Total Protein: 7.9 g/dL (ref 6.5–8.1)

## 2021-10-02 LAB — LIPASE, BLOOD: Lipase: 28 U/L (ref 11–51)

## 2021-10-02 LAB — I-STAT BETA HCG BLOOD, ED (MC, WL, AP ONLY): I-stat hCG, quantitative: <5 m[IU]/mL (ref <5)

## 2021-10-02 MED ORDER — ONDANSETRON HCL 4 MG/2ML IJ SOLN
4.0000 mg | Freq: Once | INTRAMUSCULAR | Status: AC
  Administered 2021-10-02: 4 mg via INTRAVENOUS
  Filled 2021-10-02: qty 2

## 2021-10-02 MED ORDER — SODIUM CHLORIDE 0.9 % IV BOLUS
1000.0000 mL | Freq: Once | INTRAVENOUS | Status: AC
  Administered 2021-10-02: 1000 mL via INTRAVENOUS

## 2021-10-02 MED ORDER — DIPHENHYDRAMINE HCL 50 MG/ML IJ SOLN
12.5000 mg | Freq: Once | INTRAMUSCULAR | Status: AC
  Administered 2021-10-02: 12.5 mg via INTRAVENOUS
  Filled 2021-10-02: qty 1

## 2021-10-02 MED ORDER — ONDANSETRON HCL 4 MG PO TABS
4.0000 mg | ORAL_TABLET | Freq: Four times a day (QID) | ORAL | 0 refills | Status: DC
Start: 2021-10-02 — End: 2021-12-21

## 2021-10-02 MED ORDER — DIPHENHYDRAMINE HCL 50 MG/ML IJ SOLN
50.0000 mg | Freq: Once | INTRAMUSCULAR | Status: AC
  Administered 2021-10-02: 50 mg via INTRAVENOUS
  Filled 2021-10-02: qty 1

## 2021-10-02 MED ORDER — MORPHINE SULFATE (PF) 4 MG/ML IV SOLN
4.0000 mg | Freq: Once | INTRAVENOUS | Status: AC
  Administered 2021-10-02: 4 mg via INTRAVENOUS
  Filled 2021-10-02: qty 1

## 2021-10-02 NOTE — ED Triage Notes (Signed)
Patient reports that she was seen at [facility] on 09/29/21 with emesis, fatigue, and body aches. Patient reported then that she had Covid a week ago.  Today, the patient c/o chest pain,cough, N/V/D.

## 2021-10-02 NOTE — Discharge Instructions (Addendum)
Return for any problem.  ?

## 2021-10-02 NOTE — ED Notes (Signed)
Pt given sprite to sip on. Pt tolerating well

## 2021-10-02 NOTE — ED Notes (Signed)
Pt states understanding of dc instructions, importance of follow up, and prescription. Pt denies questions or concerns upon dc. Pt declined wheelchair assistance upon dc. Pt ambulated out of ed w/ steady gait. No belongings left in room upon dc.

## 2021-10-02 NOTE — ED Provider Notes (Signed)
26 year old [place] DEPT Provider Note   CSN: [id number] Arrival date & time: 10/02/21  0844     History  Chief Complaint  Patient presents with   Chest Pain   Emesis   Diarrhea    Kaytelyn Glore is a 26 y.o. female.  26 year old female with prior medical history as detailed below presents for evaluation.  Patient reports being diagnosed with COVID approximately 10 days prior.  Patient reports continued nausea with associated symptoms of dehydration over the last several days.  She also complains of continued body aches and fatigue.  She was seen for same complaints on September 8 at [facility] facility.  She is largely compliant with medications including Eliquis.  She reports that she has not taken her Eliquis in the last 2 days secondary to vomiting.  She denies chest pain or shortness of breath.      The history is provided by the patient and medical records.       Home Medications Prior to Admission medications   Medication Sig Start Date End Date Taking? Authorizing Provider  ondansetron (ZOFRAN) 4 MG tablet Take 1 tablet (4 mg total) by mouth every 6 (six) hours. 10/02/21  Yes Valarie Merino, MD  apixaban (ELIQUIS) 5 MG TABS tablet Take 1 tablet (5 mg total) by mouth 2 (two) times daily. 08/25/21   Cherene Altes, MD  calcium carbonate (TUMS - DOSED IN MG ELEMENTAL CALCIUM) 500 MG chewable tablet Chew 1 tablet (200 mg of elemental calcium total) by mouth 3 (three) times daily as needed for indigestion or heartburn. 08/25/21   Cherene Altes, MD  Continuous Blood Gluc Sensor (FREESTYLE LIBRE 2 SENSOR) MISC Apply one sensor to arm every 14 days. 09/03/21   Crain, Whitney L, PA  guaiFENesin-codeine (CHERATUSSIN AC) 100-10 MG/5ML syrup Take 5 mLs by mouth 4 (four) times daily as needed for cough. 09/28/21   Barrett Henle, MD  lurasidone (LATUDA) 20 MG TABS tablet Take 1 tablet (20 mg total) by mouth daily with supper. 08/25/21    Cherene Altes, MD  norethindrone (AYGESTIN) 5 MG tablet Take 1 tablet (5 mg total) by mouth daily. 08/25/21   Cherene Altes, MD  ondansetron (ZOFRAN) 8 MG tablet Take 1 tablet (8 mg total) by mouth every 8 (eight) hours as needed for nausea or vomiting. 09/01/21   Molpus, Jenny Reichmann, MD  oxyCODONE-acetaminophen (PERCOCET/ROXICET) 5-325 MG tablet Take 1 tablet by mouth every 6 (six) hours as needed for severe pain. 09/29/21   Hayden Rasmussen, MD  predniSONE (DELTASONE) 20 MG tablet Take 2 tablets (40 mg total) by mouth daily with breakfast for 5 days. 09/28/21 10/03/21  Barrett Henle, MD  dicyclomine (BENTYL) 20 MG tablet Take 1 tablet (20 mg total) by mouth 2 (two) times daily. 11/29/18 04/11/19  Muthersbaugh, Jarrett Soho, PA-C  Fluticasone Propionate HFA (FLOVENT HFA IN) Inhale into the lungs. Patient not taking: Reported on 11/14/2019  11/14/19  [provider]      Allergies    Azithromycin, Peanut-containing drug products, Iodinated contrast media, Other, Amoxicillin, Gluten meal, Lactose intolerance (gi), and Morphine and related    Review of Systems   Review of Systems  All other systems reviewed and are negative.   Physical Exam Updated Vital Signs BP (!) 163/113   Pulse 100   Temp 97.8 F (36.6 C) (Oral)   Resp 18   Ht 5' 2"  (1.575 m)   Wt 99.8 kg   LMP 08/20/2020 (  Approximate)   SpO2 100%   BMI 40.24 kg/m  Physical Exam Vitals and nursing note reviewed.  Constitutional:      General: She is not in acute distress.    Appearance: Normal appearance. She is well-developed.  HENT:     Head: Normocephalic and atraumatic.  Eyes:     Conjunctiva/sclera: Conjunctivae normal.     Pupils: Pupils are equal, round, and reactive to light.  Cardiovascular:     Rate and Rhythm: Regular rhythm. Tachycardia present.     Heart sounds: Normal heart sounds.  Pulmonary:     Effort: Pulmonary effort is normal. No respiratory distress.     Breath sounds: Normal breath sounds.   Abdominal:     General: There is no distension.     Palpations: Abdomen is soft.     Tenderness: There is no abdominal tenderness.  Musculoskeletal:        General: No deformity. Normal range of motion.     Cervical back: Normal range of motion and neck supple.  Skin:    General: Skin is warm and dry.  Neurological:     General: No focal deficit present.     Mental Status: She is alert and oriented to person, place, and time.     ED Results / Procedures / Treatments   Labs (all labs ordered are listed, but only abnormal results are displayed) Labs Reviewed  CBC - Abnormal; Notable for the following components:      Result Value   Hemoglobin 11.6 (*)    MCV 74.0 (*)    MCH 23.7 (*)    RDW 17.8 (*)    Platelets 454 (*)    All other components within normal limits  COMPREHENSIVE METABOLIC PANEL - Abnormal; Notable for the following components:   Chloride 113 (*)    CO2 21 (*)    Glucose, Bld 163 (*)    All other components within normal limits  LIPASE, BLOOD  I-STAT BETA HCG BLOOD, ED ([facility], [facility], [facility] ONLY)    EKG EKG Interpretation  Date/Time:  Monday October 02 2021 08:53:21 EDT Ventricular Rate:  133 PR Interval:  140 QRS Duration: 90 QT Interval:  311 QTC Calculation: 463 R Axis:   51 Text Interpretation: Sinus tachycardia Baseline wander in lead(s) V3 Confirmed by Dene Gentry [phone]) on 10/02/2021 9:16:58 AM  Radiology DG Chest 2 View  Result Date: 10/02/2021 CLINICAL DATA:  Chest pain EXAM: CHEST - 2 VIEW COMPARISON:  Chest radiograph 09/28/2021 FINDINGS: The cardiomediastinal silhouette is normal. There is no focal consolidation or pulmonary edema. There is no pleural effusion or pneumothorax There is no acute osseous abnormality. IMPRESSION: No radiographic evidence of acute cardiopulmonary process. Electronically Signed   By: Valetta Mole M.D.   On: 10/02/2021 10:02    Procedures Procedures    Medications Ordered in ED Medications  sodium chloride 0.9  % bolus 1,000 mL (0 mLs Intravenous Stopped 10/02/21 1249)  ondansetron (ZOFRAN) injection 4 mg (4 mg Intravenous Given 10/02/21 0937)  morphine (PF) 4 MG/ML injection 4 mg (4 mg Intravenous Given 10/02/21 0938)  diphenhydrAMINE (BENADRYL) injection 50 mg (50 mg Intravenous Given 10/02/21 0939)  morphine (PF) 4 MG/ML injection 4 mg (4 mg Intravenous Given 10/02/21 1149)  ondansetron (ZOFRAN) injection 4 mg (4 mg Intravenous Given 10/02/21 1148)  diphenhydrAMINE (BENADRYL) injection 12.5 mg (12.5 mg Intravenous Given 10/02/21 1232)    ED Course/ Medical Decision Making/ A&P  Medical Decision Making Amount and/or Complexity of Data Reviewed Labs: ordered.  Risk Prescription drug management.    Medical Screen Complete  This patient presented to the ED with complaint of dehydration  This complaint involves an extensive number of treatment options. The initial differential diagnosis includes, but is not limited to, dehydration, metabolic abnormality, other infection, etc.  This presentation is: Acute, Self-Limited, Previously Undiagnosed, Uncertain Prognosis, Complicated, Systemic Symptoms, and Threat to Life/Bodily Function  Patient is reporting recent COVID infection.  Complains of persistent nausea with vomiting.  She is concerned about possible dehydration.    Patient is with evidence of mild dehydration on exam.  With IV fluids and antiemetics patient is significantly improved.  After ED evaluation work-up she desires discharge home.  She is taking p.o. well at time of discharge.  Importance of close follow-up was stressed.  Strict return precautions given and understood.  Additional history obtained:  External records from outside sources obtained and reviewed including prior ED visits and prior Inpatient records.    Lab Tests:  I ordered and personally interpreted labs.  The pertinent results include: CBC, CMP, lipase, hCG   Imaging Studies  ordered:  I ordered imaging studies including CXR  I independently visualized and interpreted obtained imaging which showed NAD I agree with the radiologist interpretation.   Cardiac Monitoring:  The patient was maintained on a cardiac monitor.  I personally viewed and interpreted the cardiac monitor which showed an underlying rhythm of: NSR   Medicines ordered:  I ordered medication including zofran, morphine, IVF  for dehydration  Reevaluation of the patient after these medicines showed that the patient: improved  Problem List / ED Course:  Nausea, vomiting   Reevaluation:  After the interventions noted above, I reevaluated the patient and found that they have: improved   Disposition:  After consideration of the diagnostic results and the patients response to treatment, I feel that the patent would benefit from close outpatient followup.          Final Clinical Impression(s) / ED Diagnoses Final diagnoses:  Nausea and vomiting, unspecified vomiting type    Rx / DC Orders ED Discharge Orders          Ordered    ondansetron (ZOFRAN) 4 MG tablet  Every 6 hours        10/02/21 1250              Valarie Merino, MD 10/02/21 1318

## 2021-10-06 ENCOUNTER — Emergency Department (HOSPITAL_BASED_OUTPATIENT_CLINIC_OR_DEPARTMENT_OTHER): Payer: PPO | Admitting: Radiology

## 2021-10-06 ENCOUNTER — Other Ambulatory Visit: Payer: Self-pay

## 2021-10-06 ENCOUNTER — Emergency Department (HOSPITAL_BASED_OUTPATIENT_CLINIC_OR_DEPARTMENT_OTHER)
Admission: EM | Admit: 2021-10-06 | Discharge: 2021-10-06 | Disposition: A | Discharge status: Home / Self Care | Payer: PPO | Attending: Emergency Medicine | Admitting: Emergency Medicine

## 2021-10-06 ENCOUNTER — Encounter (HOSPITAL_BASED_OUTPATIENT_CLINIC_OR_DEPARTMENT_OTHER): Payer: Self-pay

## 2021-10-06 DIAGNOSIS — J45909 Unspecified asthma, uncomplicated: Secondary | ICD-10-CM | POA: Insufficient documentation

## 2021-10-06 DIAGNOSIS — R509 Fever, unspecified: Secondary | ICD-10-CM | POA: Insufficient documentation

## 2021-10-06 DIAGNOSIS — R059 Cough, unspecified: Secondary | ICD-10-CM | POA: Diagnosis present

## 2021-10-06 DIAGNOSIS — R052 Subacute cough: Secondary | ICD-10-CM | POA: Diagnosis not present

## 2021-10-06 MED ORDER — DIPHENHYDRAMINE HCL 50 MG/ML IJ SOLN
25.0000 mg | Freq: Once | INTRAMUSCULAR | Status: AC
  Administered 2021-10-06: 25 mg via INTRAMUSCULAR
  Filled 2021-10-06: qty 1

## 2021-10-06 MED ORDER — FENTANYL CITRATE PF 50 MCG/ML IJ SOSY
100.0000 ug | PREFILLED_SYRINGE | Freq: Once | INTRAMUSCULAR | Status: AC
  Administered 2021-10-06: 100 ug via INTRAMUSCULAR
  Filled 2021-10-06: qty 2

## 2021-10-06 NOTE — Discharge Instructions (Signed)
Please continue your home medications, drink plenty of fluids, follow with your primary care physician.

## 2021-10-06 NOTE — ED Triage Notes (Signed)
Reports having covid 2 weeks ago.   Says she has been  having a lingering cough, fevers ~101. Treating with motrin around the clock.   Pr also sts generalized abdominal pain and suspects it is related to ovarian cysts or endometriosis.   Generalized body aches. Referred by PCP back to ER for further evaluation.

## 2021-10-06 NOTE — ED Provider Notes (Signed)
Emergency Department Provider Note   I have reviewed the triage vital signs and the nursing notes.   HISTORY  Chief Complaint Cough and Fever   HPI [name] is a 26 y.o. female with past history reviewed below including lupus, endometriosis, asthma presents to the emergency department with continued cough, nausea, body aches.  She reports having a fever earlier today and was referred back to the ER for evaluation by her PCP.  She was diagnosed with COVID 2 weeks ago but presented too late, apparently, for antiviral medication.  She has had multiple ED visits in the past week with labs and chest x-rays on those occasions.  She is currently taking cough medication along with Zofran.  She continues to have some occasional vomiting but overall is able to keep down fluids and food if she is taking her medications.  No severe abdominal pain.  She notes some chest tightness, mainly with cough.    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 ENT: Mild sore throat. Cardiovascular: Positive chest pain w/ cough.  Respiratory: Denies shortness of breath. Positive cough.  Gastrointestinal: No abdominal pain. Positive nausea, vomiting, and diarrhea.  No constipation. Genitourinary: Negative for dysuria. Musculoskeletal: Positive body aches.  Skin: Negative for rash. Neurological: Negative for headaches, focal weakness or numbness.   ____________________________________________   PHYSICAL EXAM:  VITAL SIGNS: ED Triage Vitals  Enc Vitals Group     BP  10/06/21 0242 (!) 154/98     Pulse Rate 10/06/21 0242 (!) 104     Resp 10/06/21 0242 18     Temp 10/06/21 0242 98.1 F (36.7 C)     Temp src --      SpO2 10/06/21 0242 100 %     Weight 10/06/21 0240 220 lb (99.8 kg)     Height 10/06/21 0240 5' 2"  (1.575 m)   Constitutional: Alert and oriented. Well appearing and in no acute distress. Eyes: Conjunctivae are normal. Head: Atraumatic. Nose: No congestion/rhinnorhea. Mouth/Throat: Mucous membranes are moist.   Neck: No stridor.   Cardiovascular: Mild tachycardia. Good peripheral circulation. Grossly normal heart sounds.   Respiratory: Normal respiratory effort.  No retractions. Lungs CTAB. Gastrointestinal: No distention.  Musculoskeletal: No lower extremity tenderness nor edema. No gross deformities of extremities. Neurologic:  Normal speech and language.  Skin:  Skin is warm, dry and intact. No rash noted. ___________  EKG   EKG Interpretation  Date/Time:  Friday October 06 2021 02:43:58 EDT Ventricular Rate:  108 PR Interval:  156 QRS Duration: 88 QT Interval:  333 QTC Calculation: 447 R Axis:   61 Text Interpretation: Sinus tachycardia Low voltage, precordial leads Borderline T abnormalities, anterior leads Confirmed by Nanda Quinton [phone]) on 10/06/2021 2:46:50 AM        ____________________________________________  RADIOLOGY  DG Chest 2 View  Result Date: 10/06/2021 CLINICAL DATA:  COVID positive 2 weeks ago with lingering cough and fever. EXAM: CHEST - 2 VIEW COMPARISON:  October 02, 2021 FINDINGS:  The heart size and mediastinal contours are within normal limits. Both lungs are clear. Radiopaque surgical clips are seen within the right upper quadrant. The visualized skeletal structures are unremarkable. IMPRESSION: No active cardiopulmonary disease. Electronically Signed   By: Virgina Norfolk M.D.   On: 10/06/2021 03:10    ____________________________________________   PROCEDURES  Procedure(s) performed:    Procedures  None  ____________________________________________   INITIAL IMPRESSION / ASSESSMENT AND PLAN / ED COURSE  Pertinent labs & imaging results that were available during my care of the patient were reviewed by me and considered in my medical decision making (see chart for details).   This patient is Presenting for Evaluation of cough, which does require a range of treatment options, and is a complaint that involves a moderate risk of morbidity and mortality.  The Differential Diagnoses include post-viral PNA, COVID, Flu, PE, ACS, etc.  Critical Interventions-    Medications  fentaNYL (SUBLIMAZE) injection 100 mcg (100 mcg Intramuscular Given 10/06/21 0254)  diphenhydrAMINE (BENADRYL) injection 25 mg (25 mg Intramuscular Given 10/06/21 0320)    Reassessment after intervention:  symptoms improved.    I decided to review pertinent External Data, and in summary patient with multiple ED visits in the past week with lab work from the prior visit showing no leukocytosis, acute kidney injury, severe electrolyte disturbance.   Clinical Laboratory Tests: Considered obtaining repeat blood work today but after review of prior visits and labs in conjunction with the patient's overall well clinical appearance plan to defer additional lab testing for now.  She appears well-hydrated, is tolerating p.o. at home.   Radiologic Tests Ordered, included CXR. I independently interpreted the images and agree with radiology interpretation.   Cardiac Monitor Tracing which shows NSR.    Social Determinants of Health Risk no smoking history.   Consult complete with  Medical Decision Making: Summary:  Patient presents to the emergency department for evaluation of body aches along with cough.  Plan for repeat chest x-ray today to evaluate for possible postviral pneumonia.  As above, considered repeat labs but patient is tolerating fluids and food at home when she is taking her home medications.   She is in no distress.  Mild tachycardia but afebrile with normal oxygen saturation.  No hypotension or severe hypertension.   Reevaluation with update and discussion with patient. No infiltrate on CXR. Patient continuing to look well. Symptoms improved. Tolerating PO. Plan for d/c.   Disposition: discharge  ____________________________________________  FINAL CLINICAL IMPRESSION(S) / ED DIAGNOSES  Final diagnoses:  Subacute cough     Note:  This document was prepared using Dragon voice recognition software and may include unintentional dictation errors.  Nanda Quinton, MD, [facility]    Valdis Bevill, W[facility], MD 10/06/21 [phone]

## 2021-10-09 ENCOUNTER — Ambulatory Visit: Payer: Self-pay

## 2021-10-09 ENCOUNTER — Emergency Department (HOSPITAL_COMMUNITY)
Admission: EM | Admit: 2021-10-09 | Discharge: 2021-10-09 | Disposition: A | Discharge status: Home / Self Care | Payer: PPO | Attending: Emergency Medicine | Admitting: Emergency Medicine

## 2021-10-09 ENCOUNTER — Other Ambulatory Visit: Payer: Self-pay

## 2021-10-09 ENCOUNTER — Emergency Department (HOSPITAL_COMMUNITY): Payer: PPO

## 2021-10-09 DIAGNOSIS — R112 Nausea with vomiting, unspecified: Secondary | ICD-10-CM | POA: Diagnosis not present

## 2021-10-09 DIAGNOSIS — Z91010 Allergy to peanuts: Secondary | ICD-10-CM | POA: Insufficient documentation

## 2021-10-09 DIAGNOSIS — Z7901 Long term (current) use of anticoagulants: Secondary | ICD-10-CM | POA: Insufficient documentation

## 2021-10-09 DIAGNOSIS — R0602 Shortness of breath: Secondary | ICD-10-CM | POA: Insufficient documentation

## 2021-10-09 DIAGNOSIS — R059 Cough, unspecified: Secondary | ICD-10-CM | POA: Insufficient documentation

## 2021-10-09 DIAGNOSIS — R519 Headache, unspecified: Secondary | ICD-10-CM | POA: Diagnosis not present

## 2021-10-09 DIAGNOSIS — D72829 Elevated white blood cell count, unspecified: Secondary | ICD-10-CM | POA: Diagnosis not present

## 2021-10-09 DIAGNOSIS — M791 Myalgia, unspecified site: Secondary | ICD-10-CM | POA: Insufficient documentation

## 2021-10-09 DIAGNOSIS — R739 Hyperglycemia, unspecified: Secondary | ICD-10-CM | POA: Insufficient documentation

## 2021-10-09 LAB — COMPREHENSIVE METABOLIC PANEL
ALT: 16 U/L (ref 0–44)
AST: 16 U/L (ref 15–41)
Albumin: 4.3 g/dL (ref 3.5–5.0)
Alkaline Phosphatase: 75 U/L (ref 38–126)
Anion gap: 9 (ref 5–15)
BUN: 7 mg/dL (ref 6–20)
CO2: 21 mmol/L — ABNORMAL LOW (ref 22–32)
Calcium: 9.8 mg/dL (ref 8.9–10.3)
Chloride: 111 mmol/L (ref 98–111)
Creatinine, Ser: 0.77 mg/dL (ref 0.44–1.00)
GFR, Estimated: >60 mL/min (ref >60)
Glucose, Bld: 159 mg/dL — ABNORMAL HIGH (ref 70–99)
Potassium: 3.5 mmol/L (ref 3.5–5.1)
Sodium: 141 mmol/L (ref 135–145)
Total Bilirubin: 0.3 mg/dL (ref 0.3–1.2)
Total Protein: 7.8 g/dL (ref 6.5–8.1)

## 2021-10-09 LAB — CBC WITH DIFFERENTIAL/PLATELET
Abs Immature Granulocytes: 0.07 10*3/uL (ref 0.00–0.07)
Basophils Absolute: 0 10*3/uL (ref 0.0–0.1)
Basophils Relative: 0 %
Eosinophils Absolute: 0 10*3/uL (ref 0.0–0.5)
Eosinophils Relative: 0 %
HCT: 37.1 % (ref 36.0–46.0)
Hemoglobin: 11.9 g/dL — ABNORMAL LOW (ref 12.0–15.0)
Immature Granulocytes: 0 %
Lymphocytes Relative: 22 %
Lymphs Abs: 3.9 10*3/uL (ref 0.7–4.0)
MCH: 24.2 pg — ABNORMAL LOW (ref 26.0–34.0)
MCHC: 32.1 g/dL (ref 30.0–36.0)
MCV: 75.4 fL — ABNORMAL LOW (ref 80.0–100.0)
Monocytes Absolute: 1.1 10*3/uL — ABNORMAL HIGH (ref 0.1–1.0)
Monocytes Relative: 6 %
Neutro Abs: 12.2 10*3/uL — ABNORMAL HIGH (ref 1.7–7.7)
Neutrophils Relative %: 72 %
Platelets: 435 10*3/uL — ABNORMAL HIGH (ref 150–400)
RBC: 4.92 MIL/uL (ref 3.87–5.11)
RDW: 19.6 % — ABNORMAL HIGH (ref 11.5–15.5)
WBC: 17.3 10*3/uL — ABNORMAL HIGH (ref 4.0–10.5)
nRBC: 0 % (ref 0.0–0.2)

## 2021-10-09 MED ORDER — OXYCODONE HCL 5 MG PO TABS: 5.0000 mg | ORAL_TABLET | Freq: Four times a day (QID) | ORAL | 0 refills | Status: DC | PRN

## 2021-10-09 MED ORDER — PROMETHAZINE HCL 25 MG PO TABS: 25.0000 mg | ORAL_TABLET | Freq: Three times a day (TID) | ORAL | 0 refills | Status: DC | PRN

## 2021-10-09 NOTE — Telephone Encounter (Signed)
  Chief Complaint: SOB cough Symptoms: cough, streaky blood in emesis yesterday, h/o PE, lupus, asthma. Tachycardia (HR to 140) , black mold exposure Frequency: several ED visits in past month Breathing problem began several months ago Pertinent Negatives: Patient denies fever, dizziness,  Disposition: [x] ED /[] Urgent Care (no appt availability in office) / [] Appointment(In office/virtual)/ []  [facility]/ [] Home Care/ [] Refused Recommended Disposition /[] [facility]/ []  Follow-up with PCP Additional Notes: called [facility]- pt having moderate abd pain also from "ovarian cysts" Reason for Disposition  History of prior "blood clot" in leg or lungs (i.e., deep vein thrombosis, pulmonary embolism)  Answer Assessment - Initial Assessment Questions 1. RESPIRATORY STATUS: "Describe your breathing?" (e.g., wheezing, shortness of breath, unable to speak, severe coughing)      SOB" 2. ONSET: "When did this breathing problem begin?"      months 3. PATTERN "Does the difficult breathing come and go, or has it been constant since it started?"      constant 4. SEVERITY: "How bad is your breathing?" (e.g., mild, moderate, severe)    - MILD: No SOB at rest, mild SOB with walking, speaks normally in sentences, can lie down, no retractions, pulse < 100.    - MODERATE: SOB at rest, SOB with minimal exertion and prefers to sit, cannot lie down flat, speaks in phrases, mild retractions, audible wheezing, pulse 100-120.    - SEVERE: Very SOB at rest, speaks in single words, struggling to breathe, sitting hunched forward, retractions, pulse > 120      moderate 5. RECURRENT SYMPTOM: "Have you had difficulty breathing before?" If Yes, ask: "When was the last time?" and "What happened that time?"      Yes- PCP cough medicine 6. CARDIAC HISTORY: "Do you have any history of heart disease?" (e.g., heart attack, angina, bypass surgery, angioplasty)      H/o tachycardia with pain 140 7. LUNG  HISTORY: "Do you have any history of lung disease?"  (e.g., pulmonary embolus, asthma, emphysema)     asthma 8. CAUSE: "What do you think is causing the breathing problem?"      Black mold and covid 9. OTHER SYMPTOMS: "Do you have any other symptoms? (e.g., dizziness, runny nose, cough, chest pain, fever)     Cough, wheezing when asleep wakes pt up, pain throwing up blood, pain esophageal tear- half vomiting  10. O2 SATURATION MONITOR:  "Do you use an oxygen saturation monitor (pulse oximeter) at home?" If Yes, ask: "What is your reading (oxygen level) today?" "What is your usual oxygen saturation reading?" (e.g., 95%)       Hs occ 90 %  11. PREGNANCY: "Is there any chance you are pregnant?" "When was your last menstrual period?"       N/a 12. TRAVEL: "Have you traveled out of the country in the last month?" (e.g., travel history, exposures)       N/a  Protocols used: Breathing Difficulty-A-AH

## 2021-10-09 NOTE — ED Provider Notes (Signed)
[name] Provider Note   CSN: [id number] Arrival date & time: 10/09/21  1509     History {Add pertinent medical, surgical, social history, OB history to HPI:1} Chief Complaint  Patient presents with   Shortness of Breath    [name] is a 26 y.o. female   HPI     Home Medications Prior to Admission medications   Medication Sig Start Date End Date Taking? Authorizing Provider  oxyCODONE (ROXICODONE) 5 MG immediate release tablet Take 1 tablet (5 mg total) by mouth every 6 (six) hours as needed for up to 12 doses for severe pain. 10/09/21  Yes Menucha Dicesare, Carola Rhine, MD  promethazine (PHENERGAN) 25 MG tablet Take 1 tablet (25 mg total) by mouth every 8 (eight) hours as needed for up to 12 doses for nausea or vomiting. 10/09/21  Yes Braelyn Bordonaro, Carola Rhine, MD  apixaban (ELIQUIS) 5 MG TABS tablet Take 1 tablet (5 mg total) by mouth 2 (two) times daily. 08/25/21   Cherene Altes, MD  calcium carbonate (TUMS - DOSED IN MG ELEMENTAL CALCIUM) 500 MG chewable tablet Chew 1 tablet (200 mg of elemental calcium total) by mouth 3 (three) times daily as needed for indigestion or heartburn. 08/25/21   Cherene Altes, MD  Continuous Blood Gluc Sensor (FREESTYLE LIBRE 2 SENSOR) MISC Apply one sensor to arm every 14 days. 09/03/21   Crain, Whitney L, PA  guaiFENesin-codeine (CHERATUSSIN AC) 100-10 MG/5ML syrup Take 5 mLs by mouth 4 (four) times daily as needed for cough. 09/28/21   Barrett Henle, MD  lurasidone (LATUDA) 20 MG TABS tablet Take 1 tablet (20 mg total) by mouth daily with supper. 08/25/21   Cherene Altes, MD  norethindrone (AYGESTIN) 5 MG tablet Take 1 tablet (5 mg total) by mouth daily. 08/25/21   Cherene Altes, MD  ondansetron (ZOFRAN) 4 MG tablet Take 1 tablet (4 mg total) by mouth every 6 (six) hours. 10/02/21   Valarie Merino, MD  ondansetron (ZOFRAN) 8 MG tablet Take 1 tablet (8 mg total) by mouth every 8 (eight) hours as needed for  nausea or vomiting. 09/01/21   Molpus, Jenny Reichmann, MD  oxyCODONE-acetaminophen (PERCOCET/ROXICET) 5-325 MG tablet Take 1 tablet by mouth every 6 (six) hours as needed for severe pain. 09/29/21   Hayden Rasmussen, MD  dicyclomine (BENTYL) 20 MG tablet Take 1 tablet (20 mg total) by mouth 2 (two) times daily. 11/29/18 04/11/19  Muthersbaugh, Jarrett Soho, PA-C  Fluticasone Propionate HFA (FLOVENT HFA IN) Inhale into the lungs. Patient not taking: Reported on 11/14/2019  11/14/19  [provider]      Allergies    Azithromycin, Peanut-containing drug products, Iodinated contrast media, Other, Amoxicillin, Gluten meal, Lactose intolerance (gi), and Morphine and related    Review of Systems   Review of Systems  Physical Exam Updated Vital Signs BP (!) 133/91   Pulse (!) 101   Temp 98 F (36.7 C) (Oral)   Resp 18   Ht 5' 2"  (1.575 m)   Wt 99 kg   LMP 08/20/2020 (Approximate)   SpO2 100%   BMI 39.92 kg/m  Physical Exam  ED Results / Procedures / Treatments   Labs (all labs ordered are listed, but only abnormal results are displayed) Labs Reviewed  CBC WITH DIFFERENTIAL/PLATELET - Abnormal; Notable for the following components:      Result Value   WBC 17.3 (*)    Hemoglobin 11.9 (*)    MCV 75.4 (*)  MCH 24.2 (*)    RDW 19.6 (*)    Platelets 435 (*)    Neutro Abs 12.2 (*)    Monocytes Absolute 1.1 (*)    All other components within normal limits  COMPREHENSIVE METABOLIC PANEL - Abnormal; Notable for the following components:   CO2 21 (*)    Glucose, Bld 159 (*)    All other components within normal limits    EKG None  Radiology DG Chest 2 View  Result Date: 10/09/2021 CLINICAL DATA:  Worsening short of breath EXAM: CHEST - 2 VIEW COMPARISON:  None Available. FINDINGS: Normal mediastinum and cardiac silhouette. Normal pulmonary vasculature. No evidence of effusion, infiltrate, or pneumothorax. No acute bony abnormality. IMPRESSION: No acute cardiopulmonary process.  Electronically Signed   By: Suzy Bouchard M.D.   On: 10/09/2021 16:54    Procedures Procedures  {Document cardiac monitor, telemetry assessment procedure when appropriate:1}  Medications Ordered in ED Medications - No data to display  ED Course/ Medical Decision Making/ A&P                           Medical Decision Making Risk Prescription drug management.   ***  {Document critical care time when appropriate:1} {Document review of labs and clinical decision tools ie heart score, Chads2Vasc2 etc:1}  {Document your independent review of radiology images, and any outside records:1} {Document your discussion with family members, caretakers, and with consultants:1} {Document social determinants of health affecting pt's care:1} {Document your decision making why or why not admission, treatments were needed:1} Final Clinical Impression(s) / ED Diagnoses Final diagnoses:  Myalgia  Nausea and vomiting, unspecified vomiting type    Rx / DC Orders ED Discharge Orders          Ordered    oxyCODONE (ROXICODONE) 5 MG immediate release tablet  Every 6 hours PRN        10/09/21 2114    promethazine (PHENERGAN) 25 MG tablet  Every 8 hours PRN        10/09/21 2114

## 2021-10-09 NOTE — ED Notes (Signed)
Blue top has been collected

## 2021-10-09 NOTE — ED Triage Notes (Signed)
Pt reports mold exposure and sent to ED by PCP.  Dry cough noted.  Hx lupus and asthma.

## 2021-10-09 NOTE — ED Provider Triage Note (Signed)
Emergency Medicine Provider Triage Evaluation Note  [name] , a 26 y.o. female  was evaluated in triage.  Pt complains of worsening shortness of breath and abdominal pain.  Seen in the ED yesterday at [facility], had a CT scan study to assess for PE, per patient was inconclusive and did not effectively rule out a PE though said it was unlikely.  Also had a scan done of her abdomen/pelvis, was diagnosed with ovarian cyst, and told to follow-up.  Patient still has abdominal pain.  With occasional N/V, feels dehydrated, is unable to keep down food and fluids.  Also feels worse with her shortness of breath.  States there has been lots of dark mold growing in her apartment, which she describes with bleach every day.  Denies fever, diarrhea, vision changes.  Told by her PCP to get evaluated in the ED.  Review of Systems  Positive:  Negative: See above  Physical Exam  BP (!) 138/101 (BP Location: Left Arm)   Pulse (!) 121   Temp 98 F (36.7 C) (Oral)   Resp 20   Ht 5' 2"  (1.575 m)   Wt 99 kg   LMP 08/20/2020 (Approximate)   SpO2 98%   BMI 39.92 kg/m  Gen:   Awake, no distress   Resp:  Normal effort, CTAB, communicates well without difficulty. MSK:   Moves extremities without difficulty  Other:  Mild generalized abdominal tenderness, soft.  Medical Decision Making  Medically screening exam initiated at 4:04 PM.  Appropriate orders placed.  [name] was informed that the remainder of the evaluation will be completed by another provider, this initial triage assessment does not replace that evaluation, and the importance of remaining in the ED until their evaluation is complete.     Prince Rome, PA-C 46/65/99 [id number]

## 2021-12-14 ENCOUNTER — Emergency Department (HOSPITAL_COMMUNITY)
Admission: EM | Admit: 2021-12-14 | Discharge: 2021-12-14 | Disposition: A | Discharge status: Home / Self Care | Payer: PPO | Attending: Emergency Medicine | Admitting: Emergency Medicine

## 2021-12-14 ENCOUNTER — Encounter (HOSPITAL_COMMUNITY): Payer: Self-pay | Admitting: Emergency Medicine

## 2021-12-14 ENCOUNTER — Emergency Department (HOSPITAL_COMMUNITY): Payer: PPO

## 2021-12-14 DIAGNOSIS — N83202 Unspecified ovarian cyst, left side: Secondary | ICD-10-CM | POA: Diagnosis not present

## 2021-12-14 DIAGNOSIS — R112 Nausea with vomiting, unspecified: Secondary | ICD-10-CM | POA: Diagnosis not present

## 2021-12-14 DIAGNOSIS — R1030 Lower abdominal pain, unspecified: Secondary | ICD-10-CM | POA: Diagnosis present

## 2021-12-14 DIAGNOSIS — Z20822 Contact with and (suspected) exposure to covid-19: Secondary | ICD-10-CM | POA: Diagnosis not present

## 2021-12-14 DIAGNOSIS — Z7901 Long term (current) use of anticoagulants: Secondary | ICD-10-CM | POA: Diagnosis not present

## 2021-12-14 DIAGNOSIS — Z86711 Personal history of pulmonary embolism: Secondary | ICD-10-CM | POA: Diagnosis not present

## 2021-12-14 DIAGNOSIS — R102 Pelvic and perineal pain: Secondary | ICD-10-CM | POA: Insufficient documentation

## 2021-12-14 DIAGNOSIS — Z91010 Allergy to peanuts: Secondary | ICD-10-CM | POA: Insufficient documentation

## 2021-12-14 DIAGNOSIS — N83201 Unspecified ovarian cyst, right side: Secondary | ICD-10-CM

## 2021-12-14 LAB — CBC
HCT: 34.6 % — ABNORMAL LOW (ref 36.0–46.0)
Hemoglobin: 10.8 g/dL — ABNORMAL LOW (ref 12.0–15.0)
MCH: 23.2 pg — ABNORMAL LOW (ref 26.0–34.0)
MCHC: 31.2 g/dL (ref 30.0–36.0)
MCV: 74.4 fL — ABNORMAL LOW (ref 80.0–100.0)
Platelets: 450 10*3/uL — ABNORMAL HIGH (ref 150–400)
RBC: 4.65 MIL/uL (ref 3.87–5.11)
RDW: 17.1 % — ABNORMAL HIGH (ref 11.5–15.5)
WBC: 10.4 10*3/uL (ref 4.0–10.5)
nRBC: 0 % (ref 0.0–0.2)

## 2021-12-14 LAB — COMPREHENSIVE METABOLIC PANEL
ALT: 16 U/L (ref 0–44)
AST: 19 U/L (ref 15–41)
Albumin: 3.8 g/dL (ref 3.5–5.0)
Alkaline Phosphatase: 88 U/L (ref 38–126)
Anion gap: 8 (ref 5–15)
BUN: 6 mg/dL (ref 6–20)
CO2: 22 mmol/L (ref 22–32)
Calcium: 8.7 mg/dL — ABNORMAL LOW (ref 8.9–10.3)
Chloride: 113 mmol/L — ABNORMAL HIGH (ref 98–111)
Creatinine, Ser: 0.78 mg/dL (ref 0.44–1.00)
GFR, Estimated: >60 mL/min (ref >60)
Glucose, Bld: 125 mg/dL — ABNORMAL HIGH (ref 70–99)
Potassium: 3.3 mmol/L — ABNORMAL LOW (ref 3.5–5.1)
Sodium: 143 mmol/L (ref 135–145)
Total Bilirubin: 0.1 mg/dL — ABNORMAL LOW (ref 0.3–1.2)
Total Protein: 7.3 g/dL (ref 6.5–8.1)

## 2021-12-14 LAB — URINALYSIS, ROUTINE W REFLEX MICROSCOPIC
Bilirubin Urine: NEGATIVE
Glucose, UA: NEGATIVE mg/dL
Hgb urine dipstick: NEGATIVE
Ketones, ur: NEGATIVE mg/dL
Leukocytes,Ua: NEGATIVE
Nitrite: NEGATIVE
Protein, ur: NEGATIVE mg/dL
Specific Gravity, Urine: 1.014 (ref 1.005–1.030)
pH: 6 (ref 5.0–8.0)

## 2021-12-14 LAB — RESP PANEL BY RT-PCR (FLU A&B, COVID) ARPGX2
Influenza A by PCR: NEGATIVE
Influenza B by PCR: NEGATIVE
SARS Coronavirus 2 by RT PCR: NEGATIVE

## 2021-12-14 LAB — LIPASE, BLOOD: Lipase: 35 U/L (ref 11–51)

## 2021-12-14 LAB — HCG, QUANTITATIVE, PREGNANCY: hCG, Beta Chain, Quant, S: <1 m[IU]/mL (ref <5)

## 2021-12-14 MED ORDER — DIPHENHYDRAMINE HCL 50 MG/ML IJ SOLN: 25.0000 mg | Freq: Once | INTRAMUSCULAR | Status: DC

## 2021-12-14 MED ORDER — ONDANSETRON HCL 4 MG/2ML IJ SOLN
4.0000 mg | Freq: Once | INTRAMUSCULAR | Status: AC
  Administered 2021-12-14: 4 mg via INTRAVENOUS
  Filled 2021-12-14: qty 2

## 2021-12-14 MED ORDER — NORETHINDRONE ACETATE 5 MG PO TABS: 5.0000 mg | ORAL_TABLET | Freq: Every day | ORAL | 0 refills | Status: DC

## 2021-12-14 MED ORDER — HYDROMORPHONE HCL 1 MG/ML IJ SOLN
1.0000 mg | Freq: Once | INTRAMUSCULAR | Status: AC
  Administered 2021-12-14: 1 mg via INTRAVENOUS
  Filled 2021-12-14: qty 1

## 2021-12-14 MED ORDER — DIPHENHYDRAMINE HCL 50 MG/ML IJ SOLN
12.5000 mg | Freq: Once | INTRAMUSCULAR | Status: AC
  Administered 2021-12-14: 12.5 mg via INTRAVENOUS
  Filled 2021-12-14: qty 1

## 2021-12-14 MED ORDER — PROMETHAZINE HCL 25 MG PO TABS: 25.0000 mg | ORAL_TABLET | Freq: Four times a day (QID) | ORAL | 0 refills | Status: DC | PRN

## 2021-12-14 MED ORDER — HYDROMORPHONE HCL 1 MG/ML IJ SOLN
0.5000 mg | Freq: Once | INTRAMUSCULAR | Status: AC
  Administered 2021-12-14: 0.5 mg via INTRAVENOUS
  Filled 2021-12-14: qty 1

## 2021-12-14 MED ORDER — SODIUM CHLORIDE 0.9 % IV BOLUS
1000.0000 mL | Freq: Once | INTRAVENOUS | Status: AC
  Administered 2021-12-14: 1000 mL via INTRAVENOUS

## 2021-12-14 MED ORDER — OXYCODONE-ACETAMINOPHEN 5-325 MG PO TABS: 1.0000 | ORAL_TABLET | Freq: Four times a day (QID) | ORAL | 0 refills | Status: DC | PRN

## 2021-12-14 MED ORDER — DIPHENHYDRAMINE HCL 50 MG/ML IJ SOLN
25.0000 mg | Freq: Once | INTRAMUSCULAR | Status: AC
  Administered 2021-12-14: 25 mg via INTRAVENOUS
  Filled 2021-12-14: qty 1

## 2021-12-14 NOTE — ED Triage Notes (Signed)
Patient here from home reporting n/v with pelvic pain. Patient reports hx of lupus and pelvic cysts.

## 2021-12-14 NOTE — Discharge Instructions (Signed)
Follow-up outpatient, return for new or worsening symptoms
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
## 2021-12-17 ENCOUNTER — Encounter (HOSPITAL_BASED_OUTPATIENT_CLINIC_OR_DEPARTMENT_OTHER): Payer: Self-pay | Admitting: Emergency Medicine

## 2021-12-17 ENCOUNTER — Other Ambulatory Visit: Payer: Self-pay

## 2021-12-17 ENCOUNTER — Emergency Department (HOSPITAL_BASED_OUTPATIENT_CLINIC_OR_DEPARTMENT_OTHER): Payer: PPO

## 2021-12-17 ENCOUNTER — Emergency Department (HOSPITAL_COMMUNITY): Payer: PPO

## 2021-12-17 ENCOUNTER — Emergency Department (HOSPITAL_BASED_OUTPATIENT_CLINIC_OR_DEPARTMENT_OTHER)
Admission: EM | Admit: 2021-12-17 | Discharge: 2021-12-17 | Disposition: A | Discharge status: Home / Self Care | Payer: PPO | Source: Home / Self Care | Attending: Emergency Medicine | Admitting: Emergency Medicine

## 2021-12-17 ENCOUNTER — Encounter (HOSPITAL_COMMUNITY): Payer: Self-pay

## 2021-12-17 ENCOUNTER — Emergency Department (HOSPITAL_COMMUNITY)
Admission: EM | Admit: 2021-12-17 | Discharge: 2021-12-17 | Disposition: A | Discharge status: Home / Self Care | Payer: PPO | Attending: Emergency Medicine | Admitting: Emergency Medicine

## 2021-12-17 DIAGNOSIS — Z7901 Long term (current) use of anticoagulants: Secondary | ICD-10-CM | POA: Insufficient documentation

## 2021-12-17 DIAGNOSIS — R1032 Left lower quadrant pain: Secondary | ICD-10-CM | POA: Insufficient documentation

## 2021-12-17 DIAGNOSIS — Z91010 Allergy to peanuts: Secondary | ICD-10-CM | POA: Insufficient documentation

## 2021-12-17 DIAGNOSIS — R110 Nausea: Secondary | ICD-10-CM | POA: Insufficient documentation

## 2021-12-17 DIAGNOSIS — N8003 Adenomyosis of the uterus: Secondary | ICD-10-CM | POA: Insufficient documentation

## 2021-12-17 DIAGNOSIS — N8311 Corpus luteum cyst of right ovary: Secondary | ICD-10-CM | POA: Insufficient documentation

## 2021-12-17 DIAGNOSIS — J45909 Unspecified asthma, uncomplicated: Secondary | ICD-10-CM | POA: Insufficient documentation

## 2021-12-17 DIAGNOSIS — N83201 Unspecified ovarian cyst, right side: Secondary | ICD-10-CM

## 2021-12-17 DIAGNOSIS — R102 Pelvic and perineal pain: Secondary | ICD-10-CM

## 2021-12-17 DIAGNOSIS — Z8742 Personal history of other diseases of the female genital tract: Secondary | ICD-10-CM

## 2021-12-17 DIAGNOSIS — Z7951 Long term (current) use of inhaled steroids: Secondary | ICD-10-CM | POA: Insufficient documentation

## 2021-12-17 DIAGNOSIS — R1030 Lower abdominal pain, unspecified: Secondary | ICD-10-CM

## 2021-12-17 LAB — COMPREHENSIVE METABOLIC PANEL
ALT: 12 U/L (ref 0–44)
AST: 12 U/L — ABNORMAL LOW (ref 15–41)
Albumin: 3.9 g/dL (ref 3.5–5.0)
Alkaline Phosphatase: 84 U/L (ref 38–126)
Anion gap: 9 (ref 5–15)
BUN: 7 mg/dL (ref 6–20)
CO2: 23 mmol/L (ref 22–32)
Calcium: 9 mg/dL (ref 8.9–10.3)
Chloride: 107 mmol/L (ref 98–111)
Creatinine, Ser: 0.72 mg/dL (ref 0.44–1.00)
GFR, Estimated: >60 mL/min (ref >60)
Glucose, Bld: 108 mg/dL — ABNORMAL HIGH (ref 70–99)
Potassium: 3.4 mmol/L — ABNORMAL LOW (ref 3.5–5.1)
Sodium: 139 mmol/L (ref 135–145)
Total Bilirubin: 0.2 mg/dL — ABNORMAL LOW (ref 0.3–1.2)
Total Protein: 6.7 g/dL (ref 6.5–8.1)

## 2021-12-17 LAB — URINALYSIS, ROUTINE W REFLEX MICROSCOPIC
Bilirubin Urine: NEGATIVE
Glucose, UA: NEGATIVE mg/dL
Hgb urine dipstick: NEGATIVE
Ketones, ur: NEGATIVE mg/dL
Leukocytes,Ua: NEGATIVE
Nitrite: NEGATIVE
Protein, ur: NEGATIVE mg/dL
Specific Gravity, Urine: 1.017 (ref 1.005–1.030)
pH: 6 (ref 5.0–8.0)

## 2021-12-17 LAB — CBC WITH DIFFERENTIAL/PLATELET
Abs Immature Granulocytes: 0.01 10*3/uL (ref 0.00–0.07)
Basophils Absolute: 0 10*3/uL (ref 0.0–0.1)
Basophils Relative: 0 %
Eosinophils Absolute: 0.2 10*3/uL (ref 0.0–0.5)
Eosinophils Relative: 3 %
HCT: 31.5 % — ABNORMAL LOW (ref 36.0–46.0)
Hemoglobin: 10.1 g/dL — ABNORMAL LOW (ref 12.0–15.0)
Immature Granulocytes: 0 %
Lymphocytes Relative: 41 %
Lymphs Abs: 2.9 10*3/uL (ref 0.7–4.0)
MCH: 23.3 pg — ABNORMAL LOW (ref 26.0–34.0)
MCHC: 32.1 g/dL (ref 30.0–36.0)
MCV: 72.6 fL — ABNORMAL LOW (ref 80.0–100.0)
Monocytes Absolute: 0.4 10*3/uL (ref 0.1–1.0)
Monocytes Relative: 6 %
Neutro Abs: 3.6 10*3/uL (ref 1.7–7.7)
Neutrophils Relative %: 50 %
Platelets: 373 10*3/uL (ref 150–400)
RBC: 4.34 MIL/uL (ref 3.87–5.11)
RDW: 17.2 % — ABNORMAL HIGH (ref 11.5–15.5)
WBC: 7.1 10*3/uL (ref 4.0–10.5)
nRBC: 0 % (ref 0.0–0.2)

## 2021-12-17 LAB — LIPASE, BLOOD: Lipase: 19 U/L (ref 11–51)

## 2021-12-17 LAB — PREGNANCY, URINE: Preg Test, Ur: NEGATIVE

## 2021-12-17 MED ORDER — HYDROMORPHONE HCL 1 MG/ML IJ SOLN
0.5000 mg | Freq: Once | INTRAMUSCULAR | Status: AC
  Administered 2021-12-17: 0.5 mg via INTRAVENOUS
  Filled 2021-12-17: qty 1

## 2021-12-17 MED ORDER — HYDROCODONE-ACETAMINOPHEN 5-325 MG PO TABS: 1.0000 | ORAL_TABLET | ORAL | 0 refills | Status: DC | PRN

## 2021-12-17 MED ORDER — HALOPERIDOL LACTATE 5 MG/ML IJ SOLN
5.0000 mg | Freq: Once | INTRAMUSCULAR | Status: DC
  Filled 2021-12-17: qty 1

## 2021-12-17 MED ORDER — PREDNISONE 20 MG PO TABS
60.0000 mg | ORAL_TABLET | Freq: Once | ORAL | Status: DC
  Filled 2021-12-17: qty 3

## 2021-12-17 MED ORDER — OXYCODONE-ACETAMINOPHEN 5-325 MG PO TABS
1.0000 | ORAL_TABLET | Freq: Once | ORAL | Status: DC
  Filled 2021-12-17: qty 1

## 2021-12-17 MED ORDER — DIPHENHYDRAMINE HCL 50 MG/ML IJ SOLN
12.5000 mg | Freq: Once | INTRAMUSCULAR | Status: AC
  Administered 2021-12-17: 12.5 mg via INTRAVENOUS
  Filled 2021-12-17: qty 1

## 2021-12-17 MED ORDER — SODIUM CHLORIDE 0.9 % IV SOLN: INTRAVENOUS | Status: DC

## 2021-12-17 MED ORDER — LORAZEPAM 2 MG/ML IJ SOLN: 1.0000 mg | INTRAMUSCULAR | Status: DC | PRN

## 2021-12-17 MED ORDER — SODIUM CHLORIDE 0.9 % IV BOLUS
1000.0000 mL | Freq: Once | INTRAVENOUS | Status: AC
  Administered 2021-12-17: 1000 mL via INTRAVENOUS

## 2021-12-17 MED ORDER — ONDANSETRON HCL 4 MG/2ML IJ SOLN
4.0000 mg | Freq: Once | INTRAMUSCULAR | Status: AC
  Administered 2021-12-17: 4 mg via INTRAVENOUS
  Filled 2021-12-17: qty 2

## 2021-12-17 MED ORDER — KETOROLAC TROMETHAMINE 30 MG/ML IJ SOLN
30.0000 mg | Freq: Once | INTRAMUSCULAR | Status: DC
  Filled 2021-12-17: qty 1

## 2021-12-17 MED ORDER — OXYCODONE-ACETAMINOPHEN 5-325 MG PO TABS
1.0000 | ORAL_TABLET | Freq: Once | ORAL | Status: AC
  Administered 2021-12-17: 1 via ORAL
  Filled 2021-12-17: qty 1

## 2021-12-17 MED ORDER — LORAZEPAM 2 MG/ML IJ SOLN: 2.0000 mg | INTRAMUSCULAR | Status: DC | PRN

## 2021-12-17 MED ORDER — HYDROMORPHONE HCL 1 MG/ML IJ SOLN: 1.0000 mg | Freq: Once | INTRAMUSCULAR | Status: DC

## 2021-12-17 NOTE — ED Provider Notes (Addendum)
[facility] EMERGENCY DEPT Provider Note  CSN: [id number] Arrival date & time: 12/17/21 0542  Chief Complaint(s) Abdominal Pain  HPI [name] is a 26 y.o. female with a past medical history listed below including lupus, PE on Eliquis, celiac disease, polycystic ovarian disease status post cystectomy, anxiety, major depression disorder, bipolar disorder who presents to the emergency department for lower abdominal pain mostly in the right lower quadrant concerning for "growing cysts."  Patient also reports when the pain is severe she develops nausea and vomiting.  She reports that she has had this since 6 PM last night.  Reports that she takes half a tablet of Percocet twice daily.  She was actually seen at [facility] long earlier in the morning where it is documented that patient refused work-up including transvaginal ultrasound to rule out torsion of a known right ovarian cyst.  Patient also refused Percocet and Haldol.  It was also noted that patient had pinpoint pupils and was falling asleep during the interview.  Patient is currently not involved in any view but does not have pinpoint pupils.   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   Bipolar II disorder, most recent episode major depressive ([place]) 08/25/2021   Altered mental status    Overdose 08/23/2021   Intentional acetaminophen overdose ([place])    Xanax overdose    Anxiety    Hematemesis 06/17/2020   MDD (major depressive  disorder), recurrent severe, without psychosis ([place]) 05/30/2020   Panic disorder 05/30/2020   Major depressive disorder 05/29/2020   Suicide attempt by acetaminophen overdose ([place]) 05/29/2020   Obesity, Class III, BMI 40-49.9 (morbid obesity) ([place]) 04/05/2020   SOB (shortness of breath) 04/05/2020   Abdominal pain 04/04/2020   SLE (systemic lupus erythematosus related syndrome) ([place]) 04/04/2020   Shortness of breath 04/04/2020   Pneumonia 03/23/2020   Nausea 03/23/2020   Asthma 03/22/2020   Dysuria 02/03/2020   Hematuria 02/03/2020   Mass of urinary bladder 01/26/2020   Allergic rhinitis 05/05/2019   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 Authorizing Provider  ALPRAZolam Duanne Moron) 1 MG tablet Take 1.5 mg by mouth 2 (two) times daily.    [provider]  apixaban (ELIQUIS) 5 MG TABS tablet Take 1 tablet (5 mg total) by mouth 2 (two) times daily. 08/25/21   Cherene Altes, MD  calcium carbonate (TUMS - DOSED IN MG ELEMENTAL CALCIUM) 500 MG chewable tablet Chew 1 tablet (200 mg of elemental calcium total) by mouth 3 (three) times daily as needed for indigestion or heartburn.  08/25/21   Cherene Altes, MD  Continuous Blood Gluc Sensor (FREESTYLE LIBRE 2 SENSOR) MISC Apply one sensor to arm every 14 days. 09/03/21   Crain, Whitney L, PA  cyclobenzaprine (FLEXERIL) 10 MG tablet Take 10 mg by mouth daily. 12/14/21   [provider]  DULoxetine (CYMBALTA) 30 MG capsule Take 30 mg by mouth daily.    [provider]  guaiFENesin-codeine (CHERATUSSIN AC)  100-10 MG/5ML syrup Take 5 mLs by mouth 4 (four) times daily as needed for cough. 09/28/21   Barrett Henle, MD  ibuprofen (ADVIL) 800 MG tablet Take 800 mg by mouth every 8 (eight) hours as needed.    [provider]  lurasidone (LATUDA) 20 MG TABS tablet Take 1 tablet (20 mg total) by mouth daily with supper. 08/25/21   Cherene Altes, MD  norethindrone (AYGESTIN) 5 MG tablet Take 1 tablet (5 mg total) by mouth daily. 12/14/21   Henderly, Britni A, PA-C  ondansetron (ZOFRAN) 4 MG tablet Take 1 tablet (4 mg total) by mouth every 6 (six) hours. 10/02/21   Valarie Merino, MD  ondansetron (ZOFRAN) 8 MG tablet Take 1 tablet (8 mg total) by mouth every 8 (eight) hours as needed for nausea or vomiting. 09/01/21   Molpus, Jenny Reichmann, MD  oxyCODONE (ROXICODONE) 5 MG immediate release tablet Take 1 tablet (5 mg total) by mouth every 6 (six) hours as needed for up to 12 doses for severe pain. 10/09/21   Wyvonnia Dusky, MD  oxyCODONE-acetaminophen (PERCOCET/ROXICET) 5-325 MG tablet Take 1 tablet by mouth every 6 (six) hours as needed for severe pain. 12/14/21   Henderly, Britni A, PA-C  promethazine (PHENERGAN) 25 MG tablet Take 1 tablet (25 mg total) by mouth every 6 (six) hours as needed for nausea or vomiting. 12/14/21   Henderly, Britni A, PA-C  zolpidem (AMBIEN) 10 MG tablet Take 20 mg by mouth at bedtime.    [provider]  dicyclomine (BENTYL) 20 MG tablet Take 1 tablet (20 mg total) by mouth 2 (two) times daily. 11/29/18 04/11/19  Muthersbaugh, Jarrett Soho, PA-C  Fluticasone Propionate HFA (FLOVENT HFA IN) Inhale into the lungs. Patient not taking: Reported on 11/14/2019  11/14/19  [provider]                                                                                                                                    Allergies Azithromycin, Peanut-containing drug products, Iodinated contrast media, Other, Amoxicillin, Gluten meal, Lactose intolerance (gi), and Morphine and  related  Review of Systems Review of Systems  Gastrointestinal:  Positive for abdominal pain.   As noted in HPI  Physical Exam Vital Signs  I have reviewed the triage vital signs BP (!) 126/104 (BP Location: Right Arm)   Pulse (!) 129   Temp 98.1 F (36.7 C) (Oral)   Resp 17   Ht 5' 2"  (1.575 m)   Wt  104.3 kg   SpO2 100%   BMI 42.07 kg/m   Physical Exam Vitals reviewed.  Constitutional:      General: She is not in acute distress.    Appearance: She is well-developed. She is not diaphoretic.  HENT:     Head: Normocephalic and atraumatic.     Right Ear: External ear normal.     Left Ear: External ear normal.     Nose: Nose normal.  Eyes:     General: No scleral icterus.    Conjunctiva/sclera: Conjunctivae normal.  Neck:     Trachea: Phonation normal.  Cardiovascular:     Rate and Rhythm: Normal rate and regular rhythm.  Pulmonary:     Effort: Pulmonary effort is normal. No respiratory distress.     Breath sounds: No stridor.  Abdominal:     General: There is no distension.     Tenderness: There is abdominal tenderness in the right lower quadrant and suprapubic area. There is no guarding or rebound.  Musculoskeletal:        General: Normal range of motion.     Cervical back: Normal range of motion.  Neurological:     Mental Status: She is alert and oriented to person, place, and time.  Psychiatric:        Behavior: Behavior normal.     ED Results and Treatments Labs (all labs ordered are listed, but only abnormal results are displayed) Labs Reviewed  CBC WITH DIFFERENTIAL/PLATELET - Abnormal; Notable for the following components:      Result Value   Hemoglobin 10.1 (*)    HCT 31.5 (*)    MCV 72.6 (*)    MCH 23.3 (*)    RDW 17.2 (*)    All other components within normal limits  URINALYSIS, ROUTINE W REFLEX MICROSCOPIC  PREGNANCY, URINE  COMPREHENSIVE METABOLIC PANEL  LIPASE, BLOOD                                                                                                                          EKG  EKG Interpretation  Date/Time:    Ventricular Rate:    PR Interval:    QRS Duration:   QT Interval:    QTC Calculation:   R Axis:     Text Interpretation:         Radiology No results found.  Medications Ordered in ED Medications  sodium chloride 0.9 % bolus 1,000 mL (1,000 mLs Intravenous New Bag/Given 12/17/21 0752)    And  0.9 %  sodium chloride infusion (has no administration in time range)  ondansetron (ZOFRAN) injection 4 mg (4 mg Intravenous Given 12/17/21 0657)  oxyCODONE-acetaminophen (PERCOCET/ROXICET) 5-325 MG per tablet 1 tablet (1 tablet Oral Given 12/17/21 0656)  Procedures Procedures  (including critical care time)  Medical Decision Making / ED Course   Medical Decision Making Amount and/or Complexity of Data Reviewed Labs: ordered. Decision-making details documented in ED Course.  Risk Prescription drug management.    Patient with lower abdominal pain mostly in the right lower quadrant.  She reports being status post appendectomy.  We will need to assess for any intra-abdominal inflammatory/infectious process or bowel obstruction given nausea and vomiting.  We will also rule out pregnancy related process including ectopic pregnancy.  We will rule out UA.  She was informed that we will likely need to rule out torsion with a transvaginal ultrasound.  Patient provided with IV fluids, antiemetics and oral pain medicine.  Given her history of multiple overdoses current somnolence, will hold off on any IV narcotics.   CBC without leukocytosis.  Mild anemia with stable hemoglobin. UA without evidence of infection. UPT negative.  Rest of the labs pending.  Patient care turned over to oncoming provider. Patient case and results discussed in detail; please see their note  for further ED managment.        Final Clinical Impression(s) / ED Diagnoses Final diagnoses:  None           This chart was dictated using voice recognition software.  Despite best efforts to proofread,  errors can occur which can change the documentation meaning.      Fatima Blank, MD 12/17/21 [phone]

## 2021-12-17 NOTE — ED Provider Notes (Addendum)
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  Reported by nurse that patient refused toradol given [place] usage.    EDP saw patient.  Patient was somnolent with pinpoint pupils on exam, she arouses easily to verbal stimuli.  Exam is otherwise benign and reassuring.  There are no signs of a surgical abdomen and patient is resting comfortably in the bed without any non-verbal cues of pain.  EDP ordered labs, imaging to ensure no torsion with 2.7 cm cyst seen on the afternoon of the 23rd on [place].  Percocet was ordered for pain and haldol (for nausea).   Nurse, Maylon Cos,  reported patient refused as the patient "didn't seen the point in it."  Jake also informed EDP, patient refused [place].  I again went to speak with the paitent and stated we are trying to help both her pain and to find out what is causing her pain and that I need labs and imaging to do so.  Patient stated "they never do this, they just treat me symptomatically." EDP stated she had both labs and ultrasound as well as CT scan on last visit.  Patient then stated she had not refused [place], only transvaginal probe as it is "triggering".   EDP called [place] tech who states patient had refused the entirety of the exam stating that "pushing on the abdomen would be too painful."  EDP returned to speak patient with nurse present.  EDP stated she had talked to [place] who states that the patient had refused the exam.  Patient then admitted she refused the entirety of the exam.  EDP stated pain medication had been ordered and we are trying to help her but she needs to allow [place] to help her and obtain labs and imaging.  She then stated oral pain medication would take too long to work to obtain this test.  EDP stated if something emergent was found we would give IV opioids at that point, but she is not allowing me to help her and is somnolent which has me concerned about her home opioid usage.  Patient is leaving  against medical advice having refused all diagnostics and reasonable interventions       Briante Loveall, MD 12/17/21 1638

## 2021-12-17 NOTE — ED Provider Notes (Signed)
[facility] DEPT Provider Note   CSN: [id number] Arrival date & time: 12/17/21  0235     History  Chief Complaint  Patient presents with   Abdominal Pain    LLQ    [name] is a 26 y.o. female.  The history is provided by the patient.  Abdominal Pain Pain location:  LLQ Context: not alcohol use   Ineffective treatments:  None tried Associated symptoms: nausea   Associated symptoms: no fever   Risk factors: not pregnant   Patient with a h/o PE on Xarelto and Bipolar disorder and cluster B personality disorder presents for ongoing LLQ pain.   States she has tumors of the ovaries that cannot be seen on [place] or CT scan and can only be seen in the OR.  Reports 2 removed in [place] and one was benign and one inconclusive but no follow up.  No chest or back pain, no SOB.       Home Medications Prior to Admission medications   Medication Sig Start Date End Date Taking? Authorizing Provider  cyclobenzaprine (FLEXERIL) 10 MG tablet Take 10 mg by mouth daily. 12/14/21  Yes [provider]  ALPRAZolam Duanne Moron) 1 MG tablet Take 1.5 mg by mouth 2 (two) times daily.    [provider]  apixaban (ELIQUIS) 5 MG TABS tablet Take 1 tablet (5 mg total) by mouth 2 (two) times daily. 08/25/21   Cherene Altes, MD  calcium carbonate (TUMS - DOSED IN MG ELEMENTAL CALCIUM) 500 MG chewable tablet Chew 1 tablet (200 mg of elemental calcium total) by mouth 3 (three) times daily as needed for indigestion or heartburn. 08/25/21   Cherene Altes, MD  Continuous Blood Gluc Sensor (FREESTYLE LIBRE 2 SENSOR) MISC Apply one sensor to arm every 14 days. 09/03/21   Crain, Whitney L, PA  DULoxetine (CYMBALTA) 30 MG capsule Take 30 mg by mouth daily.    [provider]  guaiFENesin-codeine (CHERATUSSIN AC) 100-10 MG/5ML syrup Take 5 mLs by mouth 4 (four) times daily as needed for cough. 09/28/21   Barrett Henle, MD  ibuprofen (ADVIL) 800 MG tablet  Take 800 mg by mouth every 8 (eight) hours as needed.    [provider]  lurasidone (LATUDA) 20 MG TABS tablet Take 1 tablet (20 mg total) by mouth daily with supper. 08/25/21   Cherene Altes, MD  norethindrone (AYGESTIN) 5 MG tablet Take 1 tablet (5 mg total) by mouth daily. 12/14/21   Henderly, Britni A, PA-C  ondansetron (ZOFRAN) 4 MG tablet Take 1 tablet (4 mg total) by mouth every 6 (six) hours. 10/02/21   Valarie Merino, MD  ondansetron (ZOFRAN) 8 MG tablet Take 1 tablet (8 mg total) by mouth every 8 (eight) hours as needed for nausea or vomiting. 09/01/21   Molpus, Jenny Reichmann, MD  oxyCODONE (ROXICODONE) 5 MG immediate release tablet Take 1 tablet (5 mg total) by mouth every 6 (six) hours as needed for up to 12 doses for severe pain. 10/09/21   Wyvonnia Dusky, MD  oxyCODONE-acetaminophen (PERCOCET/ROXICET) 5-325 MG tablet Take 1 tablet by mouth every 6 (six) hours as needed for severe pain. 12/14/21   Henderly, Britni A, PA-C  promethazine (PHENERGAN) 25 MG tablet Take 1 tablet (25 mg total) by mouth every 6 (six) hours as needed for nausea or vomiting. 12/14/21   Henderly, Britni A, PA-C  zolpidem (AMBIEN) 10 MG tablet Take 20 mg by mouth at bedtime.  [provider]  dicyclomine (BENTYL) 20 MG tablet Take 1 tablet (20 mg total) by mouth 2 (two) times daily. 11/29/18 04/11/19  Muthersbaugh, Jarrett Soho, PA-C  Fluticasone Propionate HFA (FLOVENT HFA IN) Inhale into the lungs. Patient not taking: Reported on 11/14/2019  11/14/19  [provider]      Allergies    Azithromycin, Peanut-containing drug products, Iodinated contrast media, Other, Amoxicillin, Gluten meal, Lactose intolerance (gi), and Morphine and related    Review of Systems   Review of Systems  Constitutional:  Negative for fever.  HENT:  Negative for facial swelling.   Gastrointestinal:  Positive for abdominal pain and nausea.  All other systems reviewed and are negative.   Physical Exam Updated  Vital Signs BP 119/83   Pulse (!) 115   Temp 98.2 F (36.8 C) (Oral)   Resp 18   Ht 5' 2"  (1.575 m)   Wt 99 kg   SpO2 98%   BMI 39.92 kg/m  Physical Exam Vitals and nursing note reviewed.  Constitutional:      General: She is not in acute distress.    Appearance: Normal appearance. She is well-developed.     Comments: Appears somnolent on exam and is falling asleep  HENT:     Head: Normocephalic and atraumatic.     Nose: Nose normal.     Mouth/Throat:     Mouth: Mucous membranes are moist.  Eyes:     Pupils: Pupils are equal, round, and reactive to light.  Cardiovascular:     Rate and Rhythm: Normal rate and regular rhythm.     Pulses: Normal pulses.     Heart sounds: Normal heart sounds.  Pulmonary:     Effort: Pulmonary effort is normal. No respiratory distress.     Breath sounds: Normal breath sounds.  Abdominal:     General: Bowel sounds are normal. There is no distension.     Palpations: Abdomen is soft. There is no mass.     Tenderness: There is no abdominal tenderness. There is no guarding or rebound.     Hernia: No hernia is present.  Genitourinary:    Vagina: No vaginal discharge.  Musculoskeletal:        General: Normal range of motion.     Cervical back: Neck supple.  Skin:    General: Skin is warm and dry.     Capillary Refill: Capillary refill takes less than 2 seconds.     Findings: No erythema or rash.  Neurological:     General: No focal deficit present.     Mental Status: She is alert.     Deep Tendon Reflexes: Reflexes normal.  Psychiatric:        Mood and Affect: Mood normal.     ED Results / Procedures / Treatments   Labs (all labs ordered are listed, but only abnormal results are displayed) Labs Reviewed  POC URINE PREG, ED    EKG None  Radiology No results found.  Procedures Procedures    Medications Ordered in ED Medications  ketorolac (TORADOL) 30 MG/ML injection 30 mg (has no administration in time range)  haloperidol  lactate (HALDOL) injection 5 mg (has no administration in time range)  predniSONE (DELTASONE) tablet 60 mg (has no administration in time range)  oxyCODONE-acetaminophen (PERCOCET/ROXICET) 5-325 MG per tablet 1 tablet (has no administration in time range)    ED Course/ Medical Decision Making/ A&P  Medical Decision Making Patient with ongoing lower abdominal pain and nausea   Amount and/or Complexity of Data Reviewed Independent Historian: EMS    Details: See above  External Data Reviewed: labs, radiology and notes.    Details: Notes from  and care everywhere reviewed  Labs: ordered. Radiology: ordered.    Details: Patient refused, EDP called to speak to patient and patient reports only refusing transvaginal probe   Risk Prescription drug management.    Final Clinical Impression(s) / ED Diagnoses Final diagnoses:  Lower abdominal pain    Patient is leaving against medical advice having refused [place] and care   Rx / DC Orders ED Discharge Orders     None         Veronika Heard, MD 12/17/21 8338

## 2021-12-17 NOTE — ED Provider Notes (Signed)
  Physical Exam  BP (!) 126/104 (BP Location: Right Arm)   Pulse (!) 129   Temp 98.1 F (36.7 C) (Oral)   Resp 17   Ht 5' 2"  (1.575 m)   Wt 104.3 kg   SpO2 100%   BMI 42.07 kg/m   Physical Exam  Procedures  Procedures  ED Course / MDM    Medical Decision Making Amount and/or Complexity of Data Reviewed Labs: ordered. Radiology: ordered.  Risk Prescription drug management.   79F, hx of endometriosis, lupus, PE on Eliquis, PCOS s/p cystectomy, bipolar, borderline personality, anxiety, depression, multiple overdoses, polysubstance abuse, presenting with RLQ abdominal pain. Hx of cysts in that quadrant. Refused [place] at [place] earlier this evening. Did not want Haldol or Percocet at [place]. Plan for antiemetics and pain medicine. Currently taking Percocet and Zofran. CT on 23rd for similar pain. Plan for transvaginal US to rule out torsion.  Evaluate the patient bedside.  She continues to complain of pain and nausea.  She endorses right lower quadrant abdominal pain. She is alert without respiratory depression or pinpoint pupils. Status post Percocet for pain and zofran for nausea.  Has chronically presented for this due to history of ovarian cysts and endometriosis.  Lower concern for Appendicitis.  Considered ovarian torsion.  After discussion with the patient, will administer Ativan just prior to ultrasound due to the patient's fear of a transvaginal probe.  The patient's ultrasound resulted as follows:  IMPRESSION:  1. No acute abnormality.  2. 3.7 cm right ovarian benign functional cyst. No follow-up imaging  is recommended.  Reference: Radiology 2019 Nov;293(2):359-371.  3. Nonvisualization of left ovary.    Pain improved on repeat assessment.  No evidence of torsion on ultrasound.  No other acute abnormality.  Laboratory evaluation reassuring.  Patient requesting refill of pain medication, PDMP reviewed, will provide short course of opiates, advised outpatient referral to  gynecology given the patient's history of endometriosis, history of pain associated with ovarian cysts.    Regan Lemming, MD 12/17/21 1041

## 2021-12-17 NOTE — ED Notes (Signed)
Asked patient if she was going to take the medication ordered prior to IV start. She states that she will not take haldol (bc she does not see the indication for it), nor toradol (bc she takes xarelto). She states that she does not agree with those orders. She also states that she has percocets at home which she takes throughout the day and states that they do not work. MD made aware.

## 2021-12-17 NOTE — ED Triage Notes (Signed)
BIBA from home for ovarian cyst pain has a hx, also has a lupus flare up was here recently for the same

## 2021-12-17 NOTE — ED Notes (Signed)
Pt refuses [place] at this time per [place] tech. MD aware.

## 2021-12-17 NOTE — ED Notes (Signed)
Pt appears glassy-eyed and is slow to respond when conversing.

## 2021-12-17 NOTE — ED Notes (Signed)
Dr. Randal Buba at the bedside on the phone line with the [place] tech asking the pt will she have the test performed if it is modified. The pt states that she needs pain medication that is stronger than the percocet that was ordered, and it would take too long to kick in. The pt stated, "I will just go home and be in pain."

## 2021-12-17 NOTE — ED Notes (Signed)
[place] at the bedside, but pt refused the exam stating, "I can not do the exam unless I have pain medicine on board. The test will be too painful without pain meds."

## 2021-12-17 NOTE — ED Notes (Signed)
Patient transported to Ultrasound 

## 2021-12-17 NOTE — ED Notes (Signed)
Signature pad not working, MSE explained, pt verbalized understanding

## 2021-12-17 NOTE — ED Notes (Signed)
I have attempted IV with no success. I was, however able to obtain lab work. My colleague will attempt u/s-guided IV asap.

## 2021-12-17 NOTE — ED Triage Notes (Signed)
Pt c/o abd pain and "pelvic pain" with hx of ovarian cyst, nausea; pt further endorses "bone pain due to lupus"

## 2021-12-17 NOTE — Discharge Instructions (Addendum)
Your US revealed the following: IMPRESSION:  1. No acute abnormality.  2. 3.7 cm right ovarian benign functional cyst. No follow-up imaging  is recommended.  Reference: Radiology 2019 Nov;293(2):359-371.  3. Nonvisualization of left ovary.   Recommend outpatient GYN follow-up, referral has been placed. Call tomorrow to schedule.

## 2021-12-17 NOTE — ED Notes (Signed)
The pt asked to have her armband removed, and the left the ED against medical advice. Charge nurse and Dr. Randal Buba made aware.

## 2021-12-20 ENCOUNTER — Other Ambulatory Visit: Payer: Self-pay

## 2021-12-20 ENCOUNTER — Emergency Department (HOSPITAL_BASED_OUTPATIENT_CLINIC_OR_DEPARTMENT_OTHER)
Admission: EM | Admit: 2021-12-20 | Discharge: 2021-12-20 | Disposition: A | Discharge status: Home / Self Care | Payer: PPO | Source: Home / Self Care | Attending: Emergency Medicine | Admitting: Emergency Medicine

## 2021-12-20 ENCOUNTER — Emergency Department (HOSPITAL_COMMUNITY)
Admission: EM | Admit: 2021-12-20 | Discharge: 2021-12-20 | Discharge status: Against Medical Advice | Payer: PPO | Attending: Emergency Medicine | Admitting: Emergency Medicine

## 2021-12-20 ENCOUNTER — Encounter (HOSPITAL_COMMUNITY): Payer: Self-pay

## 2021-12-20 ENCOUNTER — Encounter (HOSPITAL_BASED_OUTPATIENT_CLINIC_OR_DEPARTMENT_OTHER): Payer: Self-pay

## 2021-12-20 DIAGNOSIS — R1110 Vomiting, unspecified: Secondary | ICD-10-CM | POA: Insufficient documentation

## 2021-12-20 DIAGNOSIS — M791 Myalgia, unspecified site: Secondary | ICD-10-CM | POA: Insufficient documentation

## 2021-12-20 DIAGNOSIS — Z7901 Long term (current) use of anticoagulants: Secondary | ICD-10-CM | POA: Insufficient documentation

## 2021-12-20 DIAGNOSIS — E860 Dehydration: Secondary | ICD-10-CM | POA: Insufficient documentation

## 2021-12-20 DIAGNOSIS — R1030 Lower abdominal pain, unspecified: Secondary | ICD-10-CM | POA: Insufficient documentation

## 2021-12-20 DIAGNOSIS — R1031 Right lower quadrant pain: Secondary | ICD-10-CM | POA: Insufficient documentation

## 2021-12-20 DIAGNOSIS — Z86711 Personal history of pulmonary embolism: Secondary | ICD-10-CM | POA: Insufficient documentation

## 2021-12-20 DIAGNOSIS — Z5321 Procedure and treatment not carried out due to patient leaving prior to being seen by health care provider: Secondary | ICD-10-CM | POA: Insufficient documentation

## 2021-12-20 DIAGNOSIS — Z91010 Allergy to peanuts: Secondary | ICD-10-CM | POA: Insufficient documentation

## 2021-12-20 DIAGNOSIS — M549 Dorsalgia, unspecified: Secondary | ICD-10-CM | POA: Insufficient documentation

## 2021-12-20 DIAGNOSIS — R102 Pelvic and perineal pain: Secondary | ICD-10-CM | POA: Insufficient documentation

## 2021-12-20 DIAGNOSIS — J029 Acute pharyngitis, unspecified: Secondary | ICD-10-CM | POA: Insufficient documentation

## 2021-12-20 LAB — COMPREHENSIVE METABOLIC PANEL
ALT: 16 U/L (ref 0–44)
AST: 17 U/L (ref 15–41)
Albumin: 3.9 g/dL (ref 3.5–5.0)
Alkaline Phosphatase: 84 U/L (ref 38–126)
Anion gap: 8 (ref 5–15)
BUN: 5 mg/dL — ABNORMAL LOW (ref 6–20)
CO2: 20 mmol/L — ABNORMAL LOW (ref 22–32)
Calcium: 8.9 mg/dL (ref 8.9–10.3)
Chloride: 114 mmol/L — ABNORMAL HIGH (ref 98–111)
Creatinine, Ser: 0.8 mg/dL (ref 0.44–1.00)
GFR, Estimated: >60 mL/min (ref >60)
Glucose, Bld: 104 mg/dL — ABNORMAL HIGH (ref 70–99)
Potassium: 3.3 mmol/L — ABNORMAL LOW (ref 3.5–5.1)
Sodium: 142 mmol/L (ref 135–145)
Total Bilirubin: 0.2 mg/dL — ABNORMAL LOW (ref 0.3–1.2)
Total Protein: 7.4 g/dL (ref 6.5–8.1)

## 2021-12-20 LAB — URINALYSIS, ROUTINE W REFLEX MICROSCOPIC
Bilirubin Urine: NEGATIVE
Glucose, UA: NEGATIVE mg/dL
Hgb urine dipstick: NEGATIVE
Ketones, ur: NEGATIVE mg/dL
Leukocytes,Ua: NEGATIVE
Nitrite: NEGATIVE
Protein, ur: NEGATIVE mg/dL
Specific Gravity, Urine: 1.021 (ref 1.005–1.030)
pH: 5 (ref 5.0–8.0)

## 2021-12-20 LAB — CBC
HCT: 36.3 % (ref 36.0–46.0)
Hemoglobin: 11.2 g/dL — ABNORMAL LOW (ref 12.0–15.0)
MCH: 23.1 pg — ABNORMAL LOW (ref 26.0–34.0)
MCHC: 30.9 g/dL (ref 30.0–36.0)
MCV: 74.8 fL — ABNORMAL LOW (ref 80.0–100.0)
Platelets: 447 10*3/uL — ABNORMAL HIGH (ref 150–400)
RBC: 4.85 MIL/uL (ref 3.87–5.11)
RDW: 17.4 % — ABNORMAL HIGH (ref 11.5–15.5)
WBC: 6.8 10*3/uL (ref 4.0–10.5)
nRBC: 0 % (ref 0.0–0.2)

## 2021-12-20 LAB — LIPASE, BLOOD: Lipase: 31 U/L (ref 11–51)

## 2021-12-20 LAB — PREGNANCY, URINE: Preg Test, Ur: NEGATIVE

## 2021-12-20 MED ORDER — ONDANSETRON HCL 4 MG/2ML IJ SOLN
4.0000 mg | Freq: Once | INTRAMUSCULAR | Status: AC
  Administered 2021-12-20: 4 mg via INTRAVENOUS
  Filled 2021-12-20: qty 2

## 2021-12-20 MED ORDER — FENTANYL CITRATE PF 50 MCG/ML IJ SOSY
50.0000 ug | PREFILLED_SYRINGE | Freq: Once | INTRAMUSCULAR | Status: AC
  Administered 2021-12-20: 50 ug via INTRAVENOUS
  Filled 2021-12-20: qty 1

## 2021-12-20 MED ORDER — DIPHENHYDRAMINE HCL 50 MG/ML IJ SOLN
25.0000 mg | Freq: Once | INTRAMUSCULAR | Status: AC
  Administered 2021-12-20: 25 mg via INTRAVENOUS
  Filled 2021-12-20: qty 1

## 2021-12-20 MED ORDER — DIPHENHYDRAMINE HCL 25 MG PO CAPS
25.0000 mg | ORAL_CAPSULE | Freq: Once | ORAL | Status: AC
  Administered 2021-12-20: 25 mg via ORAL
  Filled 2021-12-20: qty 1

## 2021-12-20 MED ORDER — MORPHINE SULFATE (PF) 4 MG/ML IV SOLN
4.0000 mg | Freq: Once | INTRAVENOUS | Status: AC
  Administered 2021-12-20: 4 mg via INTRAVENOUS
  Filled 2021-12-20: qty 1

## 2021-12-20 MED ORDER — SODIUM CHLORIDE 0.9 % IV BOLUS
1000.0000 mL | Freq: Once | INTRAVENOUS | Status: AC
  Administered 2021-12-20: 1000 mL via INTRAVENOUS

## 2021-12-20 NOTE — Progress Notes (Signed)
GYNECOLOGY  VISIT   HPI: 26 y.o.   Single  African American  female   G0P0000 with No LMP recorded (lmp unknown). (Menstrual status: Oral contraceptives).   here for  discuss ovarian tumor, abdominal pain, vomiting blood everyday discuss sore throat: cannot swallow easily, difficulty walking.    Patient has long standing history of advanced endometriosis and has undergone 4 surgical procedures. Her last surgery was hysteroscopy, laparoscopy with lysis of adhesions, left ovarian cystectomy, removal of left ovarian nodule, and coagulation of endometriosis on 10/18/21 care with Dr. Arvella Nigh in [place], [place].  Felt better after surgery.  States she has a prescription for pain medication from her gynecologist.  She gets the best control of her pain with muscle relaxers. Functional right ovarian cyst.   The patient has had multiple ER visits documented in Epic regarding chronic abdominal and pelvic pain.   At her ER visit on 12/17/21, pelvic US showed a 3.7 cm functional right ovarian cyst.  Her left ovary was not visualized.  Her uterus was normal. She had a CT scan that day which showed no acute findings.   She has had an appendectomy.   Her goal is to not have ovarian issues.  Specifically has considered removal of her left ovary to treat left sided pain radiating in to her back. States she is missing out on life, is not functional, and had lost friends because of her pain.  In so much pain, she is vomiting a lot and is having hematemesis.    She has tried [place], and had side effects.  Taking Aygestin and she does not have a menstrual period.  She would like an opportunity to have a pregnancy in the future.   She has decline referral to a pain clinic because she feels there is a stigma associated with this.   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   From [place]. In grad school here.  Her insurance is running out at the end of this month.  GYNECOLOGIC HISTORY: No LMP recorded (lmp  unknown). (Menstrual status: Oral contraceptives). 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   Bipolar II disorder, most recent episode major depressive ([place]) 08/25/2021   Altered mental status    Overdose 08/23/2021   Intentional acetaminophen overdose ([place])    Xanax overdose    Anxiety    Hematemesis 06/17/2020   MDD (major depressive disorder), recurrent severe, 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   Panic disorder 05/30/2020   Major depressive disorder 05/29/2020   Suicide attempt by acetaminophen overdose ([place]) 05/29/2020   Obesity, Class III, BMI 40-49.9 (morbid obesity) ([place]) 04/05/2020   SOB (shortness of breath) 04/05/2020   Abdominal pain 04/04/2020   SLE (systemic lupus erythematosus related syndrome) ([place]) 04/04/2020   Shortness of breath 04/04/2020   Pneumonia 03/23/2020   Nausea  03/23/2020   Asthma 03/22/2020   Dysuria 02/03/2020   Hematuria 02/03/2020   Mass of urinary bladder 01/26/2020   Allergic rhinitis 05/05/2019   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   Collagen vascular disease ([place])    Diarrhea    Patient mentions diagnosis of ulcerative colitis but  it is not clear she actually has UC   Endometriosis    Lupus ([place])    Ovarian cyst    Panic disorder 05/30/2020   Pulmonary embolus ([place]) 02/06/2020   Seizures ([place])     Past Surgical History:  Procedure Laterality Date   APPENDECTOMY     CHOLECYSTECTOMY  2015   ESOPHAGOGASTRODUODENOSCOPY (EGD) WITH PROPOFOL N/A 06/18/2020   Procedure: ESOPHAGOGASTRODUODENOSCOPY (EGD) WITH PROPOFOL;  Surgeon: Doran Stabler, MD;  Location: [facility];  Service: Gastroenterology;  Laterality: N/A;   EXCISION OF ENDOMETRIOMA     Ovarian cyst removal   excision of endometriosis     FOOT FRACTURE SURGERY     w/ hardware   HERNIA REPAIR     TONSILLECTOMY      Current Outpatient Medications  Medication Sig Dispense Refill   ALPRAZolam (XANAX) 1 MG tablet Take 1.5 mg by mouth 2 (two) times daily.     apixaban (ELIQUIS) 5 MG TABS tablet Take 1 tablet (5 mg total) by mouth 2 (two) times daily. 60 tablet 2   cyclobenzaprine (FLEXERIL) 10 MG tablet Take 10 mg by mouth daily.     HYDROcodone-acetaminophen (NORCO/VICODIN) 5-325 MG tablet Take 1-2 tablets by mouth every 4 (four) hours as needed. 10 tablet 0   hydroxychloroquine (PLAQUENIL) 200 MG tablet Take 400 mg by mouth daily.     norethindrone (AYGESTIN) 5 MG tablet Take 1 tablet (5 mg total) by mouth daily. 30 tablet 0   promethazine (PHENERGAN) 25 MG tablet Take 1 tablet (25 mg total) by mouth every 6 (six) hours as needed for nausea or vomiting. 15 tablet 0   zolpidem (AMBIEN) 10 MG tablet Take 20 mg by mouth at bedtime.     No current facility-administered medications for this visit.     ALLERGIES: Azithromycin, Peanut-containing drug products, Iodinated contrast media, Other, Amoxicillin, Gluten meal, Lactose intolerance (gi), and Morphine and related  Family History  Problem Relation Age of Onset   Hypertension Mother    Hypercholesterolemia Mother    Rheum arthritis Mother    Diabetes Father    Hypertension Father    Cancer Father     Autism Brother    Cancer Maternal Grandmother        Ovarian   Breast cancer Other        Paternal great aunt's breast cancer   Cancer Other        patneral great aunt's ovarian cancer    Social History   Socioeconomic History   Marital status: Single    Spouse name: Not on file   Number of children: Not on file   Years of education: Not on file   Highest education level: Not on file  Occupational History  Not on file  Tobacco Use   Smoking status: Never   Smokeless tobacco: Never  Vaping Use   Vaping Use: Never used  Substance and Sexual Activity   Alcohol use: Never   Drug use: Never   Sexual activity: Not Currently    Partners: Male    Birth control/protection: Pill  Other Topics Concern   Not on file  Social History Narrative   Not on file   Social Determinants of Health   Financial Resource Strain: Not on file  Food Insecurity: Not on file  Transportation Needs: Not on file  Physical Activity: Not on file  Stress: Not on file  Social Connections: Not on file  Intimate Partner Violence: Not on file    Review of Systems  See HPI.   PHYSICAL EXAMINATION:    BP 128/84 (BP Location: Right Arm, Patient Position: Sitting, Cuff Size: Large)   Pulse 86   Ht 5' 1.5" (1.562 m)   Wt 229 lb (103.9 kg)   LMP  (LMP Unknown) Comment: takes bc cont  BMI 42.57 kg/m     General appearance: alert, cooperative and appears stated age  Examination declined.  ASSESSMENT  Hx chronic abdominal and pelvic pain. Focal LLQ pain.  Status post multiple surgeries for endometriosis. Hx advanced stage endometriosis.  Chronic narcotic use.  Hx pulmonary embolus.  Status post appendectomy.   PLAN  Medical history reviewed with patient and medical encounters in Epic reviewed as well.  Will refer to the patient to Minimally Invasive Gynecologic Surgeon for further evaluation and care.  She accepts referral.  If patient will require surgical care, a multidisciplinary team  may be beneficial.  The patient would also benefit from referral to a pain clinic and establishing care with a PCP. No prescription medication was prescribed to the patient today. FU prn.    An After Visit Summary was printed and given to the patient.  36 min  total time was spent for this patient encounter, including preparation, face-to-face counseling with the patient, coordination of care, and documentation of the encounter.

## 2021-12-20 NOTE — ED Notes (Addendum)
Pt seen here 2 days ago and states her symptoms have only worsened. Pt had many chief complaints including on-going pelvic pain, generalized fatigue, lower extremity swelling/pain with exertion, urinary urgency and incontinence. Pt's pains are all 10/10 on pain scale. Pt also appears to have pressured speech at this time. Awaiting provider in rm 11

## 2021-12-20 NOTE — ED Provider Notes (Signed)
[facility] EMERGENCY DEPT Provider Note   CSN: [id number] Arrival date & time: 12/20/21  1101     History  Chief Complaint  Patient presents with   Pelvic Pain    [name] is a 25 y.o. female26F, hx of endometriosis, lupus, PE on Eliquis, PCOS s/p cystectomy, bipolar, borderline personality, anxiety, depression, multiple overdoses, polysubstance abuse, presenting with RLQ abdominal pain. Hx of cysts in that quadrant.  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 Patient here for 21st ER visit in the last 6 mos. She does not have a pcp here and she does not have a pain mgmt specialist. She reports a slew of complaints including pain, body aches, vomiting, dehydration, sore throat from vomiting. She had her blood drawn early this [facility] at [facility], but eloped due to wait time.  She reports that she has an appointment with OB/GYN in tomorrow but due to her pain she could not wait.  PDMP reviewed during this encounter.  She has a NARx score of 510 due to opiates and an overdose score of 590.     Pelvic Pain       Home Medications Prior to Admission medications   Medication Sig Start Date End Date Taking? Authorizing Provider  ALPRAZolam Duanne Moron) 1 MG tablet Take 1.5 mg by mouth 2 (two) times daily.    [provider]  apixaban (ELIQUIS) 5 MG TABS tablet Take 1 tablet (5 mg total) by mouth 2 (two) times daily. 08/25/21   Cherene Altes, MD  calcium carbonate (TUMS - DOSED IN MG ELEMENTAL CALCIUM) 500 MG chewable tablet Chew 1 tablet (200 mg of elemental calcium total) by mouth 3 (three) times daily as needed for indigestion or heartburn. 08/25/21   Cherene Altes, MD  Continuous Blood Gluc Sensor (FREESTYLE LIBRE 2 SENSOR) MISC Apply one sensor to arm every 14 days. 09/03/21   Crain, Whitney L, PA  cyclobenzaprine (FLEXERIL) 10 MG tablet Take 10 mg by mouth daily. 12/14/21   [provider]  DULoxetine (CYMBALTA) 30 MG capsule  Take 30 mg by mouth daily.    [provider]  guaiFENesin-codeine (CHERATUSSIN AC) 100-10 MG/5ML syrup Take 5 mLs by mouth 4 (four) times daily as needed for cough. 09/28/21   Barrett Henle, MD  HYDROcodone-acetaminophen (NORCO/VICODIN) 5-325 MG tablet Take 1-2 tablets by mouth every 4 (four) hours as needed. 12/17/21   Regan Lemming, MD  ibuprofen (ADVIL) 800 MG tablet Take 800 mg by mouth every 8 (eight) hours as needed.    [provider]  lurasidone (LATUDA) 20 MG TABS tablet Take 1 tablet (20 mg total) by mouth daily with supper. 08/25/21   Cherene Altes, MD  norethindrone (AYGESTIN) 5 MG tablet Take 1 tablet (5 mg total) by mouth daily. 12/14/21   Henderly, Britni A, PA-C  ondansetron (ZOFRAN) 4 MG tablet Take 1 tablet (4 mg total) by mouth every 6 (six) hours. 10/02/21   Valarie Merino, MD  ondansetron (ZOFRAN) 8 MG tablet Take 1 tablet (8 mg total) by mouth every 8 (eight) hours as needed for nausea or vomiting. 09/01/21   Molpus, Jenny Reichmann, MD  promethazine (PHENERGAN) 25 MG tablet Take 1 tablet (25 mg total) by mouth every 6 (six) hours as needed for nausea or vomiting. 12/14/21   Henderly, Britni A, PA-C  zolpidem (AMBIEN) 10 MG tablet Take 20 mg by mouth at bedtime.    [provider]  dicyclomine (BENTYL) 20 MG tablet  Take 1 tablet (20 mg total) by mouth 2 (two) times daily. 11/29/18 04/11/19  Muthersbaugh, Jarrett Soho, PA-C  Fluticasone Propionate HFA (FLOVENT HFA IN) Inhale into the lungs. Patient not taking: Reported on 11/14/2019  11/14/19  [provider]      Allergies    Azithromycin, Peanut-containing drug products, Iodinated contrast media, Other, Amoxicillin, Gluten meal, Lactose intolerance (gi), and Morphine and related    Review of Systems   Review of Systems  Genitourinary:  Positive for pelvic pain.    Physical Exam Updated Vital Signs BP (!) 133/122   Pulse (!) 106   Temp 97.6 F (36.4 C) (Oral)   Resp 15   Ht 5' 2"  (1.575 m)    Wt 104.3 kg   SpO2 99%   BMI 42.06 kg/m  Physical Exam Vitals and nursing note reviewed.  Constitutional:      General: She is not in acute distress.    Appearance: She is well-developed. She is not diaphoretic.  HENT:     Head: Normocephalic and atraumatic.     Right Ear: External ear normal.     Left Ear: External ear normal.     Nose: Nose normal.     Mouth/Throat:     Mouth: Mucous membranes are moist.  Eyes:     General: No scleral icterus.    Conjunctiva/sclera: Conjunctivae normal.  Cardiovascular:     Rate and Rhythm: Normal rate and regular rhythm.     Heart sounds: Normal heart sounds. No murmur heard.    No friction rub. No gallop.  Pulmonary:     Effort: Pulmonary effort is normal. No respiratory distress.     Breath sounds: Normal breath sounds.  Abdominal:     General: Bowel sounds are normal. There is no distension.     Palpations: Abdomen is soft. There is no mass.     Tenderness: There is abdominal tenderness in the right lower quadrant, periumbilical area, suprapubic area and left lower quadrant. There is no guarding.  Musculoskeletal:     Cervical back: Normal range of motion.  Skin:    General: Skin is warm and dry.  Neurological:     Mental Status: She is alert and oriented to person, place, and time.  Psychiatric:        Behavior: Behavior normal.     ED Results / Procedures / Treatments   Labs (all labs ordered are listed, but only abnormal results are displayed) Labs Reviewed - No data to display  EKG None  Radiology No results found.  Procedures Procedures    Medications Ordered in ED Medications  sodium chloride 0.9 % bolus 1,000 mL (has no administration in time range)  diphenhydrAMINE (BENADRYL) injection 25 mg (has no administration in time range)  ondansetron (ZOFRAN) injection 4 mg (has no administration in time range)  morphine (PF) 4 MG/ML injection 4 mg (has no administration in time range)    ED Course/ Medical  Decision Making/ A&P Clinical Course as of 12/20/21 1236  Wed Dec 20, 2021  1235 I discussed patient's use of the emergency department with 21 visits in the last 53-monthas well as her overdose risk score.  I discussed that if she was going to need narcotics or other controlled substances or pain medications that regularly that the emergency department is not the place to treat her pain or get refills on pain medications.  I also discussed that I would not be prescribing any controlled substances for her today however I  could give her referral to pain management.  Patient also understands that she needs a primary care doctor to manage her chronic illnesses. [AH]    Clinical Course User Index [AH] Margarita Mail, PA-C                           Medical Decision Making 26 year old female here with pelvic pain.  She has follow-up with her OB/GYN tomorrow.  I reviewed labs ordered at 3:40 AM this morning and there was no significant abnormalities.  Patient given fluids and heart rate is improved.  Will give referral to pain management.  I have highlighted the 800-number in the back of her AVS needed for establishment of primary care physician.  Do not think she has any other emergent issues today.  Appears appropriate for discharge.  Risk Prescription drug management.           Final Clinical Impression(s) / ED Diagnoses Final diagnoses:  None    Rx / DC Orders ED Discharge Orders     None         Margarita Mail, PA-C 12/20/21 [place]ien, [place], DO 12/20/21 1459

## 2021-12-20 NOTE — ED Triage Notes (Signed)
Patient arrived with complaints of ongoing lower abdominal, groin, and back pain. Known ovarian cysts. Taking pain medication with no relief.

## 2021-12-20 NOTE — Discharge Instructions (Signed)
Get help right away if: You have severe pain along with fever, nausea, vomiting, and excessive sweating. You lose consciousness.

## 2021-12-20 NOTE — ED Triage Notes (Signed)
Patient here POV from Home.  Endorses Pelvic Pain that . Associated with N/V. Visited another ED Room prior to arrival but LWBS due to Wait.   History of Ovarian Cysts.   NAD Noted during Triage. A&Ox4. GCS 15. Ambulatory.

## 2021-12-21 ENCOUNTER — Encounter: Payer: Self-pay | Admitting: Obstetrics and Gynecology

## 2021-12-21 ENCOUNTER — Ambulatory Visit (INDEPENDENT_AMBULATORY_CARE_PROVIDER_SITE_OTHER): Payer: PPO | Admitting: Obstetrics and Gynecology

## 2021-12-21 ENCOUNTER — Telehealth: Payer: Self-pay

## 2021-12-21 ENCOUNTER — Telehealth: Payer: Self-pay | Admitting: Obstetrics and Gynecology

## 2021-12-21 ENCOUNTER — Ambulatory Visit: Payer: PPO | Admitting: Obstetrics and Gynecology

## 2021-12-21 VITALS — BP 128/84 | HR 86 | Ht 61.5 in | Wt 229.0 lb

## 2021-12-21 DIAGNOSIS — N809 Endometriosis, unspecified: Secondary | ICD-10-CM

## 2021-12-21 DIAGNOSIS — G8929 Other chronic pain: Secondary | ICD-10-CM | POA: Diagnosis not present

## 2021-12-21 DIAGNOSIS — R102 Pelvic and perineal pain: Secondary | ICD-10-CM | POA: Diagnosis not present

## 2021-12-21 DIAGNOSIS — Z8742 Personal history of other diseases of the female genital tract: Secondary | ICD-10-CM

## 2021-12-21 NOTE — Telephone Encounter (Signed)
Patient in office now.   Encounter closed.

## 2021-12-21 NOTE — Telephone Encounter (Signed)
I called [facility] [phone] and was told by Lorriane Shire fax referral to [phone] she will have Dr.Ajewole review.

## 2021-12-21 NOTE — Telephone Encounter (Signed)
Please make an urgent referral to Dr. Angelique Holm, the [facility] surgeon.   My patient has advanced stage endometriosis and chronic LLQ pain.  She is needing surgery before the end of this year, as she will be losing her insurance.

## 2021-12-21 NOTE — Telephone Encounter (Signed)
Pt calling to report recent visit to ER due to extreme pelvic pain/dehydration from vomiting. States she was advised that she needed to have ovarian cyst removed or even complete ovary removed due to reoccurring painful cysts. Pt had also called appt desk and looks like pt is scheduled to see you today @ 1130. Is there anything that you would like me to advise of her in meantime? Thanks.

## 2021-12-22 ENCOUNTER — Encounter (HOSPITAL_COMMUNITY): Payer: Self-pay | Admitting: Emergency Medicine

## 2021-12-22 ENCOUNTER — Emergency Department (HOSPITAL_COMMUNITY)
Admission: EM | Admit: 2021-12-22 | Discharge: 2021-12-22 | Disposition: A | Discharge status: Home / Self Care | Payer: PPO | Attending: Emergency Medicine | Admitting: Emergency Medicine

## 2021-12-22 ENCOUNTER — Telehealth: Payer: Self-pay | Admitting: Obstetrics and Gynecology

## 2021-12-22 DIAGNOSIS — R1030 Lower abdominal pain, unspecified: Secondary | ICD-10-CM

## 2021-12-22 DIAGNOSIS — R1084 Generalized abdominal pain: Secondary | ICD-10-CM | POA: Diagnosis not present

## 2021-12-22 DIAGNOSIS — R112 Nausea with vomiting, unspecified: Secondary | ICD-10-CM | POA: Insufficient documentation

## 2021-12-22 DIAGNOSIS — Z91010 Allergy to peanuts: Secondary | ICD-10-CM | POA: Insufficient documentation

## 2021-12-22 DIAGNOSIS — Z7901 Long term (current) use of anticoagulants: Secondary | ICD-10-CM | POA: Insufficient documentation

## 2021-12-22 MED ORDER — SODIUM CHLORIDE 0.9 % IV SOLN
25.0000 mg | Freq: Once | INTRAVENOUS | Status: AC
  Administered 2021-12-22: 25 mg via INTRAVENOUS
  Filled 2021-12-22: qty 1

## 2021-12-22 MED ORDER — HYDROMORPHONE HCL 1 MG/ML IJ SOLN
1.0000 mg | Freq: Once | INTRAMUSCULAR | Status: AC
  Administered 2021-12-22: 1 mg via INTRAVENOUS
  Filled 2021-12-22: qty 1

## 2021-12-22 MED ORDER — DIPHENHYDRAMINE HCL 50 MG/ML IJ SOLN
50.0000 mg | Freq: Once | INTRAMUSCULAR | Status: AC
  Administered 2021-12-22: 50 mg via INTRAVENOUS
  Filled 2021-12-22: qty 1

## 2021-12-22 NOTE — Telephone Encounter (Signed)
Error

## 2021-12-22 NOTE — Discharge Instructions (Signed)
Your history, exam, evaluation are consistent with recurrent pain from your known cysts and endometriosis.  We had a shared decision-making conversation and agreed to hold on further imaging and labs and work-up today.  We spoke to OB/GYN team who still did not feel this was a case the need emergent surgery but are going to try and expedite your evaluation and further management.  Please continue with your home medicines and rest and stay hydrated.  Please call them to help facilitate the appointment if they do not call you.

## 2021-12-22 NOTE — ED Provider Notes (Signed)
[facility] EMERGENCY DEPARTMENT Provider Note   CSN: [id number] Arrival date & time: 12/22/21  2094     History  No chief complaint on file.   [name] is a 26 y.o. female.  The history is provided by the patient and medical records. No language interpreter was used.  Abdominal Pain Pain location:  Generalized Pain quality: aching and sharp   Pain severity:  Severe Onset quality:  Gradual Duration:  1 day Timing:  Constant Progression:  Waxing and waning Chronicity:  Recurrent Relieved by:  Nothing Worsened by:  Nothing Associated symptoms: nausea and vomiting   Associated symptoms: no chest pain, no chills, no constipation, no cough, no diarrhea, no dysuria, no fatigue, no fever, no shortness of breath, no vaginal bleeding and no vaginal discharge   Risk factors: multiple surgeries        Home Medications Prior to Admission medications   Medication Sig Start Date End Date Taking? Authorizing Provider  ALPRAZolam Duanne Moron) 1 MG tablet Take 1.5 mg by mouth 2 (two) times daily.    [provider]  apixaban (ELIQUIS) 5 MG TABS tablet Take 1 tablet (5 mg total) by mouth 2 (two) times daily. 08/25/21   Cherene Altes, MD  cyclobenzaprine (FLEXERIL) 10 MG tablet Take 10 mg by mouth daily. 12/14/21   [provider]  HYDROcodone-acetaminophen (NORCO/VICODIN) 5-325 MG tablet Take 1-2 tablets by mouth every 4 (four) hours as needed. 12/17/21   Regan Lemming, MD  hydroxychloroquine (PLAQUENIL) 200 MG tablet Take 400 mg by mouth daily.    [provider]  norethindrone (AYGESTIN) 5 MG tablet Take 1 tablet (5 mg total) by mouth daily. 12/14/21   Henderly, Britni A, PA-C  promethazine (PHENERGAN) 25 MG tablet Take 1 tablet (25 mg total) by mouth every 6 (six) hours as needed for nausea or vomiting. 12/14/21   Henderly, Britni A, PA-C  zolpidem (AMBIEN) 10 MG tablet Take 20 mg by mouth at bedtime.    [provider]   dicyclomine (BENTYL) 20 MG tablet Take 1 tablet (20 mg total) by mouth 2 (two) times daily. 11/29/18 04/11/19  Muthersbaugh, Jarrett Soho, PA-C  Fluticasone Propionate HFA (FLOVENT HFA IN) Inhale into the lungs. Patient not taking: Reported on 11/14/2019  11/14/19  [provider]      Allergies    Azithromycin, Peanut-containing drug products, Iodinated contrast media, Other, Amoxicillin, Gluten meal, Lactose intolerance (gi), and Morphine and related    Review of Systems   Review of Systems  Constitutional:  Negative for chills, fatigue and fever.  HENT:  Negative for congestion.   Respiratory:  Negative for cough, chest tightness, shortness of breath and wheezing.   Cardiovascular:  Negative for chest pain.  Gastrointestinal:  Positive for abdominal pain, nausea and vomiting. Negative for constipation and diarrhea.  Genitourinary:  Negative for dysuria, flank pain, frequency, vaginal bleeding, vaginal discharge and vaginal pain.  Musculoskeletal:  Positive for back pain. Negative for neck pain.  Skin:  Negative for rash and wound.  Neurological:  Negative for headaches.  Psychiatric/Behavioral:  Negative for agitation and confusion.   All other systems reviewed and are negative.   Physical Exam Updated Vital Signs BP (!) 161/120 (BP Location: Right Arm)   Pulse (!) 116   Temp 98.1 F (36.7 C) (Oral)   Resp 20   LMP  (LMP Unknown) Comment: takes bc cont  SpO2 100%  Physical Exam Vitals and nursing note reviewed.  Constitutional:  General: She is not in acute distress.    Appearance: She is well-developed. She is not ill-appearing, toxic-appearing or diaphoretic.  HENT:     Head: Normocephalic and atraumatic.     Nose: No congestion or rhinorrhea.     Mouth/Throat:     Mouth: Mucous membranes are moist.     Pharynx: No oropharyngeal exudate or posterior oropharyngeal erythema.  Eyes:     Extraocular Movements: Extraocular movements intact.     Conjunctiva/sclera:  Conjunctivae normal.     Pupils: Pupils are equal, round, and reactive to light.  Cardiovascular:     Rate and Rhythm: Normal rate and regular rhythm.     Heart sounds: No murmur heard. Pulmonary:     Effort: Pulmonary effort is normal. No respiratory distress.     Breath sounds: Normal breath sounds. No wheezing, rhonchi or rales.  Chest:     Chest wall: No tenderness.  Abdominal:     Palpations: Abdomen is soft.     Tenderness: There is abdominal tenderness. There is no guarding or rebound.  Genitourinary:    Comments: Deferred as she had GYN exam yesterday Musculoskeletal:        General: Tenderness present. No swelling.     Cervical back: Neck supple. No tenderness.  Skin:    General: Skin is warm and dry.     Capillary Refill: Capillary refill takes less than 2 seconds.     Findings: No erythema or rash.  Neurological:     General: No focal deficit present.     Mental Status: She is alert.  Psychiatric:        Mood and Affect: Mood normal.     ED Results / Procedures / Treatments   Labs (all labs ordered are listed, but only abnormal results are displayed) Labs Reviewed - No data to display  EKG None  Radiology No results found.  Procedures Procedures    Medications Ordered in ED Medications  HYDROmorphone (DILAUDID) injection 1 mg (has no administration in time range)  diphenhydrAMINE (BENADRYL) injection 50 mg (has no administration in time range)  diphenhydrAMINE (BENADRYL) injection 50 mg (50 mg Intravenous Given 12/22/21 0957)  HYDROmorphone (DILAUDID) injection 1 mg (1 mg Intravenous Given 12/22/21 0957)  promethazine (PHENERGAN) 25 mg in sodium chloride 0.9 % 50 mL IVPB (0 mg Intravenous Stopped 12/22/21 1032)    ED Course/ Medical Decision Making/ A&P                           Medical Decision Making Risk Prescription drug management.    [name] is a 26 y.o. female with a past medical history significant for previous pulmonary  embolism on Eliquis, lupus, endometriosis, PCOS with previous cyst removal surgeries, anxiety, and depression who presents with continued lower abdominal pain.  Patient reports that she was seen by her OB/GYN yesterday and was told that she needs to have "surgery before the end of the month".  She was told that they need "4 different surgeons in the room" to be able to help her and that was going to be a challenging scheduling.  She said that when she left the appointment she had improvement in her pain but it woke her up overnight and pain is been uncontrolled with home medicines.  She reports it is 10 out of 10 across her lower abdomen.  She was told that if symptoms worsened she potentially could have surgery on a more emergent  basis rather than doing as an outpatient.  She presents with recurrent pain that feels similar to the pain before but slightly worse.  She denies any new trauma.  She reports she has had nausea and vomiting with the pain and thus has had some soreness in her throat and scratchy voice.  She reports no constipation or diarrhea but reports diffuse body aches and diffuse pain.  She feels using dehydrated.  She reports has not been tolerating much fluid today due to the nausea and vomiting and pain.  Denies any urinary changes.  On exam, lungs were clear and chest was nontender.  Abdomen was tender diffusely but mainly in the lower abdomen.  She also had some mild paraspinal tenderness in her low back however lungs were clear.  Patient moving all extremities.  Intact sensation and strength.  She was moving her neck normally.  Oropharyngeal exam is unremarkable.  As patient had a gynecologic exam by her OB/GYN team yesterday and is denying any new pelvic symptoms, will hold on repeat pelvic exam at this time.  We had a shared decision-making conversation.  Patient says that her symptoms are consistent with the known endometriosis and cyst problems and she agrees that she does not think she  needs new imaging or ultrasound at this time.  Due to the uncontrolled pain despite taking her home medicines, the tachycardia, and her phone notes from her OB/GYN saying that she does need surgery upcoming, will call the OB/GYN team to discuss a management plan for her as she seems to be failing outpatient pain management before her reported upcoming surgery.  Spoke with Dr. Wannetta Sender with OB/GYN who reviewed the case.  She does not feel that the patient needs emergent surgery or admission at this time.  She would prefer we give some pain medicine here and get her pain better controlled today and then let her be discharged home and Dr. Wannetta Sender will help facilitate more expedient follow-up and outpatient gynecologic management.  Patient is agreement with this plan.  Will give her pain medicine, nausea resting, Benadryl for itching and if she passes a p.o. challenge, will discharge home per previous plan.  Everyone agrees on holding on more extensive workup today as this pain is similar to what she has had recently with the otherwise reassuring workup.  1:14 PM Patient reassessed and her pain had improved but then recurred.  We will give her 1 more dose of pain and nausea medicine and she will be discharged per plan with OB/GYN team.  Patient agrees with this.  Patient will be discharged for outpatient follow-up.  Patient ambulated normally and was able to be discharged.        Final Clinical Impression(s) / ED Diagnoses Final diagnoses:  Lower abdominal pain    Rx / DC Orders ED Discharge Orders     None       Clinical Impression: 1. Lower abdominal pain     Disposition: Discharge  Condition: Good  I have discussed the results, Dx and Tx plan with the pt(& family if present). He/she/they expressed understanding and agree(s) with the plan. Discharge instructions discussed at great length. Strict return precautions discussed and pt &/or family have verbalized understanding of  the instructions. No further questions at time of discharge.    New Prescriptions   No medications on file    Follow Up: Jaquita Folds, MD [place] [id number] [phone]   with Jobe Gibbon,  Gwenyth Allegra, MD 12/22/21 1422

## 2021-12-22 NOTE — ED Triage Notes (Addendum)
Pt here with convoluted complaints. She is rambling about pelvic pain, tumor on ovary, vomiting, hoarse voice. She states docs tell her she has to come here. She saw obgyn today and had [place] which revealed ovarian cyst. She states her feet and legs swollen, back hurts. Pt walks with steady gait and appears to be in no distress.

## 2021-12-22 NOTE — ED Notes (Signed)
Pt ambulates to bathroom with no assistance.  Steady gait.

## 2021-12-24 ENCOUNTER — Other Ambulatory Visit: Payer: Self-pay

## 2021-12-24 ENCOUNTER — Emergency Department (HOSPITAL_COMMUNITY)
Admission: EM | Admit: 2021-12-24 | Discharge: 2021-12-24 | Disposition: A | Discharge status: Home / Self Care | Payer: PPO | Attending: Emergency Medicine | Admitting: Emergency Medicine

## 2021-12-24 DIAGNOSIS — R000 Tachycardia, unspecified: Secondary | ICD-10-CM | POA: Diagnosis not present

## 2021-12-24 DIAGNOSIS — R112 Nausea with vomiting, unspecified: Secondary | ICD-10-CM | POA: Diagnosis not present

## 2021-12-24 DIAGNOSIS — Z7901 Long term (current) use of anticoagulants: Secondary | ICD-10-CM | POA: Diagnosis not present

## 2021-12-24 DIAGNOSIS — R1032 Left lower quadrant pain: Secondary | ICD-10-CM | POA: Insufficient documentation

## 2021-12-24 DIAGNOSIS — J45909 Unspecified asthma, uncomplicated: Secondary | ICD-10-CM | POA: Insufficient documentation

## 2021-12-24 DIAGNOSIS — Z91010 Allergy to peanuts: Secondary | ICD-10-CM | POA: Diagnosis not present

## 2021-12-24 DIAGNOSIS — R1030 Lower abdominal pain, unspecified: Secondary | ICD-10-CM

## 2021-12-24 LAB — URINALYSIS, ROUTINE W REFLEX MICROSCOPIC
Bilirubin Urine: NEGATIVE
Glucose, UA: NEGATIVE mg/dL
Ketones, ur: NEGATIVE mg/dL
Nitrite: NEGATIVE
Protein, ur: 30 mg/dL — AB
Specific Gravity, Urine: >1.03 — ABNORMAL HIGH (ref 1.005–1.030)
pH: 5.5 (ref 5.0–8.0)

## 2021-12-24 LAB — CBC
HCT: 36.1 % (ref 36.0–46.0)
Hemoglobin: 11.4 g/dL — ABNORMAL LOW (ref 12.0–15.0)
MCH: 23.3 pg — ABNORMAL LOW (ref 26.0–34.0)
MCHC: 31.6 g/dL (ref 30.0–36.0)
MCV: 73.7 fL — ABNORMAL LOW (ref 80.0–100.0)
Platelets: 465 10*3/uL — ABNORMAL HIGH (ref 150–400)
RBC: 4.9 MIL/uL (ref 3.87–5.11)
RDW: 17.7 % — ABNORMAL HIGH (ref 11.5–15.5)
WBC: 8.5 10*3/uL (ref 4.0–10.5)
nRBC: 0 % (ref 0.0–0.2)

## 2021-12-24 LAB — COMPREHENSIVE METABOLIC PANEL
ALT: 12 U/L (ref 0–44)
AST: 16 U/L (ref 15–41)
Albumin: 4 g/dL (ref 3.5–5.0)
Alkaline Phosphatase: 80 U/L (ref 38–126)
Anion gap: 9 (ref 5–15)
BUN: 7 mg/dL (ref 6–20)
CO2: 20 mmol/L — ABNORMAL LOW (ref 22–32)
Calcium: 9.2 mg/dL (ref 8.9–10.3)
Chloride: 113 mmol/L — ABNORMAL HIGH (ref 98–111)
Creatinine, Ser: 0.72 mg/dL (ref 0.44–1.00)
GFR, Estimated: >60 mL/min (ref >60)
Glucose, Bld: 121 mg/dL — ABNORMAL HIGH (ref 70–99)
Potassium: 3.5 mmol/L (ref 3.5–5.1)
Sodium: 142 mmol/L (ref 135–145)
Total Bilirubin: 0.2 mg/dL — ABNORMAL LOW (ref 0.3–1.2)
Total Protein: 7.6 g/dL (ref 6.5–8.1)

## 2021-12-24 LAB — URINALYSIS, MICROSCOPIC (REFLEX): RBC / HPF: >50 RBC/hpf (ref 0–5)

## 2021-12-24 LAB — PREGNANCY, URINE: Preg Test, Ur: NEGATIVE

## 2021-12-24 LAB — LIPASE, BLOOD: Lipase: 33 U/L (ref 11–51)

## 2021-12-24 MED ORDER — DIPHENHYDRAMINE HCL 50 MG/ML IJ SOLN
25.0000 mg | Freq: Once | INTRAMUSCULAR | Status: AC
  Administered 2021-12-24: 25 mg via INTRAVENOUS
  Filled 2021-12-24: qty 1

## 2021-12-24 MED ORDER — HYDROMORPHONE HCL 1 MG/ML IJ SOLN
1.0000 mg | Freq: Once | INTRAMUSCULAR | Status: AC
  Administered 2021-12-24: 1 mg via INTRAVENOUS
  Filled 2021-12-24: qty 1

## 2021-12-24 MED ORDER — LACTATED RINGERS IV BOLUS
1000.0000 mL | Freq: Once | INTRAVENOUS | Status: AC
  Administered 2021-12-24: 1000 mL via INTRAVENOUS

## 2021-12-24 MED ORDER — ONDANSETRON 8 MG PO TBDP: 8.0000 mg | ORAL_TABLET | Freq: Three times a day (TID) | ORAL | 0 refills | Status: DC | PRN

## 2021-12-24 MED ORDER — ONDANSETRON HCL 4 MG/2ML IJ SOLN
4.0000 mg | Freq: Once | INTRAMUSCULAR | Status: AC
  Administered 2021-12-24: 4 mg via INTRAVENOUS
  Filled 2021-12-24: qty 2

## 2021-12-24 MED ORDER — KETOROLAC TROMETHAMINE 15 MG/ML IJ SOLN
15.0000 mg | Freq: Once | INTRAMUSCULAR | Status: AC
  Administered 2021-12-24: 15 mg via INTRAVENOUS
  Filled 2021-12-24: qty 1

## 2021-12-24 MED ORDER — ALUM & MAG HYDROXIDE-SIMETH 200-200-20 MG/5ML PO SUSP
30.0000 mL | Freq: Once | ORAL | Status: AC
  Administered 2021-12-24: 30 mL via ORAL
  Filled 2021-12-24: qty 30

## 2021-12-24 MED ORDER — OXYCODONE-ACETAMINOPHEN 5-325 MG PO TABS: 1.0000 | ORAL_TABLET | Freq: Four times a day (QID) | ORAL | 0 refills | Status: DC | PRN

## 2021-12-24 NOTE — ED Triage Notes (Signed)
Left lower abdominal pain that radiates down the leg. Vomiting x 5 today. Last took flexiril, phenergan, tylenol around 1500 yesterday. Pt reporting she has a known ovarian cyst. Small amount of vaginal bleeding.

## 2021-12-24 NOTE — Discharge Instructions (Addendum)
You are seen today in the emergency department due to lower abdominal pain.  Please follow-up with your gynecologist as we discussed.  I sent a short course of pain medicine and Zofran to your pharmacy to help with the nausea and vomiting.  Return to the ED for new or concerning symptoms.

## 2021-12-24 NOTE — ED Provider Notes (Signed)
I provided a substantive portion of the care of this patient.  I personally performed the entirety of the medical decision making for this encounter.  EKG Interpretation  Date/Time:  Sunday December 24 2021 08:05:19 EST Ventricular Rate:  114 PR Interval:  136 QRS Duration: 95 QT Interval:  339 QTC Calculation: 467 R Axis:   97 Text Interpretation: Sinus tachycardia Borderline right axis deviation Borderline T abnormalities, anterior leads Confirmed by Lacretia Leigh ([id number]) on 12/24/2021 8:20:02 AM   Patient here with ongoing chronic left lower quadrant dull discomfort.  Has known history of ovarian cyst.  Patient is scheduled to have this removed in the near future.  Has been seen multiple times for same.  Plan will be to IV hydrate and give pain medication.  She may need GYN referral   Lacretia Leigh, MD 12/24/21 [phone]

## 2021-12-24 NOTE — ED Provider Notes (Signed)
[facility] DEPT Provider Note   CSN: [id number] Arrival date & time: 12/24/21  0543     History  Chief Complaint  Patient presents with   Emesis   Abdominal Pain    Darchelle Nunes is a 26 y.o. female.   Emesis Associated symptoms: abdominal pain   Abdominal Pain Associated symptoms: vomiting      Patient with medical history of endometriosis, asthma, celiac disease, ovarian cysts, cluster B personality disorder presents to the emergency department due to left-sided abdominal pain.  States has been going on for about a week, associated with emesis.  She has been having streaky hematemesis as well.  The pain is constant, has been improved by ED visits with pain medicine but states that once the pain medicine wears out the pain becomes unbearable.  She is having some trace vaginal bleeding but denies any discharge.  Denies any diarrhea, chest pain, shortness of breath.  States jpain is also all over her abdomen although it is definitely worse in the left lower quadrant/in the left ovary.  Patient is followed by gynecology, states they offered her pain medicine referral which she thought was inappropriate because her fixes surgical.  Patient has history of excision of endometrioma excision, appendectomy, cholecystectomy, EGD, hernia repair and tonsillectomy.  Home Medications Prior to Admission medications   Medication Sig Start Date End Date Taking? Authorizing Provider  ondansetron (ZOFRAN-ODT) 8 MG disintegrating tablet Take 1 tablet (8 mg total) by mouth every 8 (eight) hours as needed for nausea or vomiting. 12/24/21  Yes Sherrill Raring, PA-C  oxyCODONE-acetaminophen (PERCOCET/ROXICET) 5-325 MG tablet Take 1 tablet by mouth every 6 (six) hours as needed for severe pain. 12/24/21  Yes Sherrill Raring, PA-C  ALPRAZolam Duanne Moron) 1 MG tablet Take 1.5 mg by mouth 2 (two) times daily.    [provider]  apixaban (ELIQUIS) 5 MG TABS tablet Take 1 tablet  (5 mg total) by mouth 2 (two) times daily. 08/25/21   Cherene Altes, MD  cyclobenzaprine (FLEXERIL) 10 MG tablet Take 10 mg by mouth daily. 12/14/21   [provider]  hydroxychloroquine (PLAQUENIL) 200 MG tablet Take 400 mg by mouth daily.    [provider]  norethindrone (AYGESTIN) 5 MG tablet Take 1 tablet (5 mg total) by mouth daily. 12/14/21   Henderly, Britni A, PA-C  promethazine (PHENERGAN) 25 MG tablet Take 1 tablet (25 mg total) by mouth every 6 (six) hours as needed for nausea or vomiting. 12/14/21   Henderly, Britni A, PA-C  zolpidem (AMBIEN) 10 MG tablet Take 20 mg by mouth at bedtime.    [provider]  dicyclomine (BENTYL) 20 MG tablet Take 1 tablet (20 mg total) by mouth 2 (two) times daily. 11/29/18 04/11/19  Muthersbaugh, Jarrett Soho, PA-C  Fluticasone Propionate HFA (FLOVENT HFA IN) Inhale into the lungs. Patient not taking: Reported on 11/14/2019  11/14/19  [provider]      Allergies    Azithromycin, Peanut-containing drug products, Iodinated contrast media, Other, Amoxicillin, Gluten meal, Lactose intolerance (gi), and Morphine and related    Review of Systems   Review of Systems  Gastrointestinal:  Positive for abdominal pain and vomiting.    Physical Exam Updated Vital Signs BP 137/65 (BP Location: Right Arm)   Pulse 89   Temp 98.2 F (36.8 C) (Oral)   Resp 16   Ht 5' 1"  (1.549 m)   Wt 104.3 kg   LMP  (LMP Unknown) Comment: takes bc cont  SpO2 100%   BMI 43.46 kg/m  Physical Exam Vitals and nursing note reviewed. Exam conducted with a chaperone present.  Constitutional:      Appearance: Normal appearance.  HENT:     Head: Normocephalic and atraumatic.  Eyes:     General: No scleral icterus.       Right eye: No discharge.        Left eye: No discharge.     Extraocular Movements: Extraocular movements intact.     Pupils: Pupils are equal, round, and reactive to light.  Cardiovascular:     Rate and Rhythm: Regular  rhythm. Tachycardia present.     Pulses: Normal pulses.     Heart sounds: Normal heart sounds. No murmur heard.    No friction rub. No gallop.  Pulmonary:     Effort: Pulmonary effort is normal. No respiratory distress.     Breath sounds: Normal breath sounds.  Abdominal:     General: Abdomen is flat. Bowel sounds are normal. There is no distension.     Palpations: Abdomen is soft.     Tenderness: There is abdominal tenderness in the left lower quadrant. There is no guarding.  Skin:    General: Skin is warm and dry.     Coloration: Skin is not jaundiced.  Neurological:     Mental Status: She is alert. Mental status is at baseline.     Coordination: Coordination normal.     ED Results / Procedures / Treatments   Labs (all labs ordered are listed, but only abnormal results are displayed) Labs Reviewed  COMPREHENSIVE METABOLIC PANEL - Abnormal; Notable for the following components:      Result Value   Chloride 113 (*)    CO2 20 (*)    Glucose, Bld 121 (*)    Total Bilirubin 0.2 (*)    All other components within normal limits  CBC - Abnormal; Notable for the following components:   Hemoglobin 11.4 (*)    MCV 73.7 (*)    MCH 23.3 (*)    RDW 17.7 (*)    Platelets 465 (*)    All other components within normal limits  URINALYSIS, ROUTINE W REFLEX MICROSCOPIC - Abnormal; Notable for the following components:   APPearance CLOUDY (*)    Specific Gravity, Urine >1.030 (*)    Hgb urine dipstick LARGE (*)    Protein, ur 30 (*)    Leukocytes,Ua TRACE (*)    All other components within normal limits  URINALYSIS, MICROSCOPIC (REFLEX) - Abnormal; Notable for the following components:   Bacteria, UA MANY (*)    All other components within normal limits  LIPASE, BLOOD  PREGNANCY, URINE    EKG EKG Interpretation  Date/Time:  Sunday December 24 2021 08:05:19 EST Ventricular Rate:  114 PR Interval:  136 QRS Duration: 95 QT Interval:  339 QTC Calculation: 467 R Axis:   97 Text  Interpretation: Sinus tachycardia Borderline right axis deviation Borderline T abnormalities, anterior leads Confirmed by Lacretia Leigh ([id number]) on 12/24/2021 8:20:02 AM  Radiology No results found.  Procedures Procedures    Medications Ordered in ED Medications  lactated ringers bolus 1,000 mL (1,000 mLs Intravenous New Bag/Given 12/24/21 0953)  HYDROmorphone (DILAUDID) injection 1 mg (1 mg Intravenous Given 12/24/21 0955)  diphenhydrAMINE (BENADRYL) injection 25 mg (25 mg Intravenous Given 12/24/21 0955)  ondansetron (ZOFRAN) injection 4 mg (4 mg Intravenous Given 12/24/21 0954)  alum & mag hydroxide-simeth (MAALOX/MYLANTA) 200-200-20 MG/5ML suspension 30 mL (30 mLs Oral Given 12/24/21  9562)  diphenhydrAMINE (BENADRYL) injection 25 mg (25 mg Intravenous Given 12/24/21 1120)  HYDROmorphone (DILAUDID) injection 1 mg (1 mg Intravenous Given 12/24/21 1120)  ketorolac (TORADOL) 15 MG/ML injection 15 mg (15 mg Intravenous Given 12/24/21 1120)    ED Course/ Medical Decision Making/ A&P Clinical Course as of 12/24/21 1230  Sun Dec 24, 2021  1219 I reevaluated the patient, she is feeling improved.  Heart rate is also improved.  Will discharge at this time and have her follow-up closely with gynecology. [HS]    Clinical Course User Index [HS] Sherrill Raring, PA-C                           Medical Decision Making Amount and/or Complexity of Data Reviewed Labs: ordered.  Risk OTC drugs. Prescription drug management.    This is a 26 year old female presenting today due to left-sided abdominal pain.  Differential includes but not limited to acute on chronic pain, endometriosis, torsion, ovarian cysts, ruptured ectopic pregnancy, UTI, pyelonephritis.  Patient's care is complicated by multiple previous surgeries, chronic abdominal and pelvic pain, advanced age endometriosis, chronic narcotic use  I viewed external medical records including gynecology visit on 12/21/2021.  Referred to [facility] minimally  invasive gynecology 6 surgeon. -She has been seen multiple times in the ED for this this month.  -She was seen 11/23, 11/26, 11/29, 12/1 and then today. -She had imaging study of CT abdomen without contrast 12/14/2021-negative for acute process.   -She had ultrasound pelvic with transvaginal 11/23 and 11/26 both notable for 3.7 cm ovarian functional cyst, nonvisualization of left ovary.   PDMP review   12/22/2021 12/22/2021  2 Oxycodone-Acetaminophen 5-325 20.00 5 Ba [place] [id number] [facility] (8206) 0/0 30.00 MME Comm Ins [place]  12/17/2021 12/17/2021  2 Hydrocodone-Acetamin 5-325 Mg 10.00 1 Ja [place] [id number] [facility] (8206) 0/0 50.00 MME Comm Ins [place]  12/14/2021 12/14/2021  2 Oxycodone-Acetaminophen 5-325 10.00 2 Br Hen [id number] [facility] (8206) 0/0 37.50 MME Comm Ins [place]  12/12/2021 12/12/2021  2 Alprazolam 1 Mg Tablet 90.00 30 An [place] [id number] [facility] (8206) 0/2 6.00 LME Comm Ins [place]  12/12/2021 12/12/2021  2 Zolpidem Tartrate 10 Mg Tablet 60.00 30 An [place] [id number] [facility] (8206) 0/2 1.00 LME Comm Ins [place]   I ordered, viewed and interpreted laboratory work-up. CBC without leukocytosis.  Stable anemia with a hemoglobin 11.4. Not a ruptured ectopic, not pregnant. UA with hematuria and trace leukocyturia, not consistent with underlying UTI patient is on her menstrual cycle. Lipase within normal limits. CMP without gross electrolyte management, AKI or transaminitis.  I considered repeating CT and ultrasound but given extensive history and chronic nature of his pain I do not think it be indicated.  She not having any vaginal complaints and she had a pelvic exam by gynecology a week ago.  Considered repeat pelvic but deferred per patient request.  Additionally delivery particularly helpful clinically.  We will proceed to treat with fluids, pain medicine, GI cocktail and observe.  On reevaluation patient's pain is improved and tolerating p.o.  Patient shared decision making regarding repeat imaging.  Patient is in agreement this is the pain  she has been dealing with for the last week and given imaging thus far has been negative does not wish for repeat imaging.  I think that is reasonable.  Patient will follow-up with her gynecologist for reevaluation this week.  Return precautions were discussed with the patient who verbalized understanding and agreement the plan.  Discussed HPI, physical exam and plan of care for this patient with attending Lacretia Leigh. The attending physician evaluated this patient as part of a shared visit and agrees with plan of care.         Final Clinical Impression(s) / ED Diagnoses Final diagnoses:  Nausea and vomiting, unspecified vomiting type  Lower abdominal pain    Rx / DC Orders ED Discharge Orders          Ordered    oxyCODONE-acetaminophen (PERCOCET/ROXICET) 5-325 MG tablet  Every 6 hours PRN        12/24/21 1019    ondansetron (ZOFRAN-ODT) 8 MG disintegrating tablet  Every 8 hours PRN        12/24/21 1020              Sherrill Raring, PA-C 12/24/21 1230    Lacretia Leigh, MD 12/26/21 1338

## 2021-12-25 ENCOUNTER — Telehealth: Payer: Self-pay | Admitting: *Deleted

## 2021-12-25 ENCOUNTER — Telehealth (HOSPITAL_COMMUNITY): Payer: Self-pay | Admitting: Emergency Medicine

## 2021-12-25 MED ORDER — OXYCODONE-ACETAMINOPHEN 5-325 MG PO TABS: 1.0000 | ORAL_TABLET | Freq: Four times a day (QID) | ORAL | 0 refills | Status: DC | PRN

## 2021-12-25 MED ORDER — ONDANSETRON 8 MG PO TBDP: 8.0000 mg | ORAL_TABLET | Freq: Three times a day (TID) | ORAL | 0 refills | Status: DC | PRN

## 2021-12-25 NOTE — Telephone Encounter (Signed)
Patient called and said pharmacy did not have prescriptions.  Reorder prescriptions for Zofran and the oxycodone.

## 2021-12-25 NOTE — Telephone Encounter (Signed)
Called Dr. Orbie Pyo office and they stated will be calling patient to schedule appt but they did inform me they're booking out to middle of January.  Called patient to inform office will be calling to schedule appt in January.  Patient is requesting to be seen sooner.  Please advise.

## 2021-12-25 NOTE — Telephone Encounter (Signed)
Per review of EPIC, appt not scheduled. Patient see in ED on 12/24/21.   Call placed to patient to follow-up. Left detailed message, ok per dpr. Advised on referral update as seen below. Will forward to Dr. Quincy Simmonds for update. Dr. Quincy Simmonds is out of the office today, once reviewed, our office will f/u with recommendations. Our office phones go off at 4:15PM and back on at 8am tomorrow morning.   Routing to Dr. Quincy Simmonds to review.

## 2021-12-25 NOTE — Telephone Encounter (Signed)
Patient left message on surgery line requesting update on status of referral placed on 12/21/21 to Dr. Currie Paris for pelvic pain. Patient states she will need surgery completed by end of year as she will be losing her insurance. Requesting return call for status of referral.   Wyatt Portela -can you f/u with referral and update provide update to patient.

## 2021-12-26 NOTE — Telephone Encounter (Signed)
Spoke with patient. Advised per Dr. Quincy Simmonds. Patient verbalizes understanding and is agreeable.   Encounter closed.

## 2021-12-26 NOTE — Telephone Encounter (Signed)
Patient will need surgical care by a gynecologist who can treat advanced stage endometriosis with potential bowel involvement.  I am not able to provide this surgical care for the patient.   If there is surgical time available in our blocks for Dr. Currie Paris, this may be an option.  Other choices are for her to have referral to [place] or for her to return to her surgeon in [place], who has done all of her prior endometriosis surgeries.

## 2021-12-26 NOTE — Telephone Encounter (Signed)
Spoke with patient. Advised as seen below per Dr. Quincy Simmonds. Patient declines returning to [place], will consider GYN ONC if needed.   Patient states she has been throwing up blood all night. LLQ pain not controlled, pain currently 10/10, not relieved by oxycodone. Describes pain as "shooting" with dizziness and then blacks out. Nausea not relieved by zofran. Using a heating pad, no relief. Not eating, is able to drink fluids. Last BM today, describes as diarrhea. States she is weak and unable to walk. Patient states she is "unstable and in medical emergency". I instructed patient to call 911 if in medical emergency or have someone take her to closest ER for evaluation. Patient declined. States she has been to ER ,multiple times, will need Dr. Quincy Simmonds to call ahead and provide instructions. Patient states she has contacted ER for wait times and too long. Again, strongly encouraged patient to call 911 or go directly to ER for evaluation.  (Miranda present for this portion of call).   Patient states she has a GI at [facility], Dr. Alan Ripper, that she has spoken with personally, but cancelled her appt today for OV and EGD. States GI advised her GYN issues would need to be address first.   Advised patient I will f/u with Dr. Orbie Pyo office for scheduling and return call. Advised I will also route to Dr. Quincy Simmonds for review and return call with any additional recommendations. Once again, ER/911 precautions strongly advised. Patient states she will wait for recommendations from Dr. Quincy Simmonds and again requested Dr. Quincy Simmonds contact ER in advance.    Spoke with [place] at [facility] [phone]. Requesting first avaiable appt with Dr. Currie Paris for Chronic LLQ pain and endometriosis. Patient has had multiple ER visits, will need surgery. Scheduled for OV with Dr. Currie Paris on 12/28/21 at 0835.  [facility] for [facility] at [facility] [place], [place]  [address] [phone]   Call returned to patient. Advised of appt as above. Again strongly encouraged patient call 911 or go directly to ER for evaluation of symptoms. Patient again request Dr. Quincy Simmonds to review and make recommendations and call ahead to ER. Advised I will forward for her to review. Patient verbalizes understanding, will keep appt as scheduled with Dr. Currie Paris.   Length of calls 45 min.   Dr. Quincy Simmonds  -please review.

## 2021-12-26 NOTE — Telephone Encounter (Signed)
If patient is having a medical emergency, I recommend she call 911 and go to the Emergency Department.   Thank you for making the referral to Dr. Currie Paris, with whom I spoke briefly today about the patient.

## 2021-12-28 ENCOUNTER — Emergency Department (HOSPITAL_COMMUNITY): Payer: PPO

## 2021-12-28 ENCOUNTER — Other Ambulatory Visit: Payer: Self-pay

## 2021-12-28 ENCOUNTER — Emergency Department (HOSPITAL_COMMUNITY)
Admission: EM | Admit: 2021-12-28 | Discharge: 2021-12-29 | Discharge status: Against Medical Advice | Payer: PPO | Attending: Emergency Medicine | Admitting: Emergency Medicine

## 2021-12-28 ENCOUNTER — Ambulatory Visit (INDEPENDENT_AMBULATORY_CARE_PROVIDER_SITE_OTHER): Payer: PPO | Admitting: Obstetrics and Gynecology

## 2021-12-28 ENCOUNTER — Encounter: Payer: Self-pay | Admitting: Obstetrics and Gynecology

## 2021-12-28 VITALS — BP 108/65 | HR 95

## 2021-12-28 DIAGNOSIS — K920 Hematemesis: Secondary | ICD-10-CM | POA: Insufficient documentation

## 2021-12-28 DIAGNOSIS — M791 Myalgia, unspecified site: Secondary | ICD-10-CM | POA: Diagnosis not present

## 2021-12-28 DIAGNOSIS — N809 Endometriosis, unspecified: Secondary | ICD-10-CM | POA: Diagnosis not present

## 2021-12-28 DIAGNOSIS — N946 Dysmenorrhea, unspecified: Secondary | ICD-10-CM | POA: Diagnosis not present

## 2021-12-28 DIAGNOSIS — R109 Unspecified abdominal pain: Secondary | ICD-10-CM | POA: Diagnosis not present

## 2021-12-28 DIAGNOSIS — R1084 Generalized abdominal pain: Secondary | ICD-10-CM | POA: Diagnosis not present

## 2021-12-28 DIAGNOSIS — Z5321 Procedure and treatment not carried out due to patient leaving prior to being seen by health care provider: Secondary | ICD-10-CM | POA: Diagnosis not present

## 2021-12-28 MED ORDER — ONDANSETRON 4 MG PO TBDP
4.0000 mg | ORAL_TABLET | Freq: Once | ORAL | Status: AC
  Administered 2021-12-28: 4 mg via ORAL
  Filled 2021-12-28: qty 1

## 2021-12-28 MED ORDER — BACLOFEN 5 MG PO TABS: 5.0000 mg | ORAL_TABLET | Freq: Two times a day (BID) | ORAL | 3 refills | Status: DC | PRN

## 2021-12-28 NOTE — ED Provider Triage Note (Signed)
  Emergency Medicine Provider Triage Evaluation Note  MRN:  [id number]  Arrival date & time: 12/28/21    Medically screening exam initiated at 5:24 AM.   CC:   Hematemesis   HPI:  [name] is a 26 y.o. year-old female presents to the ED with chief complaint of abdominal pain and hematemesis.  States this is not a new problem, but has been gradually worsening.  States that the vomiting is from the pain of a complicated ovarian cyst that she needs to have removed.  History provided by patient. ROS:  -As included in HPI PE:   Vitals:   12/28/21 0517  BP: (!) 134/90  Pulse: (!) 104  Resp: 20  Temp: 97.7 F (36.5 C)  SpO2: 100%    Non-toxic appearing No respiratory distress  MDM:  Based on signs and symptoms, hematemesis 2/2 esophageal irritation is highest on my differential, followed by ulcer. I've ordered labs and imaging in triage to expedite lab/diagnostic workup.  Patient was informed that the remainder of the evaluation will be completed by another provider, this initial triage assessment does not replace that evaluation, and the importance of remaining in the ED until their evaluation is complete.    Montine Circle, PA-C 12/28/21 [phone]

## 2021-12-28 NOTE — Patient Instructions (Addendum)
Pelvic  PT to work on core and back   Continue muscle relaxer  Get records from prior surgeries  All GYN records from all providers

## 2021-12-28 NOTE — Telephone Encounter (Signed)
Encounter reviewed and closed.  

## 2021-12-28 NOTE — ED Triage Notes (Signed)
Patient reports bloody emesis this evening with left lower abdominal pain , no fever or diarrhea .
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
## 2021-12-28 NOTE — ED Notes (Signed)
Pt not answering for vitals 3x

## 2021-12-28 NOTE — Telephone Encounter (Signed)
Patient scheduled on 12/28/21

## 2022-01-18 ENCOUNTER — Encounter (HOSPITAL_COMMUNITY): Payer: Self-pay

## 2022-01-18 ENCOUNTER — Emergency Department (HOSPITAL_COMMUNITY)
Admission: EM | Admit: 2022-01-18 | Discharge: 2022-01-18 | Disposition: A | Discharge status: Home / Self Care | Payer: PPO | Attending: Emergency Medicine | Admitting: Emergency Medicine

## 2022-01-18 ENCOUNTER — Other Ambulatory Visit: Payer: Self-pay

## 2022-01-18 DIAGNOSIS — Z7951 Long term (current) use of inhaled steroids: Secondary | ICD-10-CM | POA: Diagnosis not present

## 2022-01-18 DIAGNOSIS — M79672 Pain in left foot: Secondary | ICD-10-CM | POA: Diagnosis not present

## 2022-01-18 DIAGNOSIS — J45909 Unspecified asthma, uncomplicated: Secondary | ICD-10-CM | POA: Diagnosis not present

## 2022-01-18 DIAGNOSIS — R109 Unspecified abdominal pain: Secondary | ICD-10-CM | POA: Insufficient documentation

## 2022-01-18 DIAGNOSIS — M79604 Pain in right leg: Secondary | ICD-10-CM | POA: Insufficient documentation

## 2022-01-18 DIAGNOSIS — Z91010 Allergy to peanuts: Secondary | ICD-10-CM | POA: Diagnosis not present

## 2022-01-18 DIAGNOSIS — M79671 Pain in right foot: Secondary | ICD-10-CM | POA: Diagnosis not present

## 2022-01-18 DIAGNOSIS — Z7901 Long term (current) use of anticoagulants: Secondary | ICD-10-CM | POA: Insufficient documentation

## 2022-01-18 DIAGNOSIS — M79605 Pain in left leg: Secondary | ICD-10-CM | POA: Insufficient documentation

## 2022-01-18 DIAGNOSIS — G8929 Other chronic pain: Secondary | ICD-10-CM

## 2022-01-18 DIAGNOSIS — Z1152 Encounter for screening for COVID-19: Secondary | ICD-10-CM | POA: Diagnosis not present

## 2022-01-18 LAB — CBC WITH DIFFERENTIAL/PLATELET
Abs Immature Granulocytes: 0.02 10*3/uL (ref 0.00–0.07)
Basophils Absolute: 0 10*3/uL (ref 0.0–0.1)
Basophils Relative: 0 %
Eosinophils Absolute: 0.1 10*3/uL (ref 0.0–0.5)
Eosinophils Relative: 2 %
HCT: 34.6 % — ABNORMAL LOW (ref 36.0–46.0)
Hemoglobin: 10.9 g/dL — ABNORMAL LOW (ref 12.0–15.0)
Immature Granulocytes: 0 %
Lymphocytes Relative: 29 %
Lymphs Abs: 2.3 10*3/uL (ref 0.7–4.0)
MCH: 23.1 pg — ABNORMAL LOW (ref 26.0–34.0)
MCHC: 31.5 g/dL (ref 30.0–36.0)
MCV: 73.5 fL — ABNORMAL LOW (ref 80.0–100.0)
Monocytes Absolute: 0.4 10*3/uL (ref 0.1–1.0)
Monocytes Relative: 5 %
Neutro Abs: 5 10*3/uL (ref 1.7–7.7)
Neutrophils Relative %: 64 %
Platelets: 434 10*3/uL — ABNORMAL HIGH (ref 150–400)
RBC: 4.71 MIL/uL (ref 3.87–5.11)
RDW: 18.1 % — ABNORMAL HIGH (ref 11.5–15.5)
WBC: 7.9 10*3/uL (ref 4.0–10.5)
nRBC: 0 % (ref 0.0–0.2)

## 2022-01-18 LAB — BASIC METABOLIC PANEL
Anion gap: 7 (ref 5–15)
BUN: 9 mg/dL (ref 6–20)
CO2: 21 mmol/L — ABNORMAL LOW (ref 22–32)
Calcium: 9 mg/dL (ref 8.9–10.3)
Chloride: 112 mmol/L — ABNORMAL HIGH (ref 98–111)
Creatinine, Ser: 0.74 mg/dL (ref 0.44–1.00)
GFR, Estimated: >60 mL/min (ref >60)
Glucose, Bld: 111 mg/dL — ABNORMAL HIGH (ref 70–99)
Potassium: 3.8 mmol/L (ref 3.5–5.1)
Sodium: 140 mmol/L (ref 135–145)

## 2022-01-18 LAB — URINALYSIS, ROUTINE W REFLEX MICROSCOPIC
Bilirubin Urine: NEGATIVE
Glucose, UA: NEGATIVE mg/dL
Hgb urine dipstick: NEGATIVE
Ketones, ur: NEGATIVE mg/dL
Leukocytes,Ua: NEGATIVE
Nitrite: NEGATIVE
Protein, ur: NEGATIVE mg/dL
Specific Gravity, Urine: 1.01 (ref 1.005–1.030)
pH: 6.5 (ref 5.0–8.0)

## 2022-01-18 LAB — RESP PANEL BY RT-PCR (RSV, FLU A&B, COVID)  RVPGX2
Influenza A by PCR: NEGATIVE
Influenza B by PCR: NEGATIVE
Resp Syncytial Virus by PCR: NEGATIVE
SARS Coronavirus 2 by RT PCR: NEGATIVE

## 2022-01-18 LAB — I-STAT BETA HCG BLOOD, ED (MC, WL, AP ONLY): I-stat hCG, quantitative: <5 m[IU]/mL (ref <5)

## 2022-01-18 LAB — MAGNESIUM: Magnesium: 2.3 mg/dL (ref 1.7–2.4)

## 2022-01-18 MED ORDER — PROMETHAZINE HCL 25 MG RE SUPP: 25.0000 mg | Freq: Four times a day (QID) | RECTAL | 0 refills | Status: DC | PRN

## 2022-01-18 MED ORDER — ONDANSETRON HCL 4 MG/2ML IJ SOLN
4.0000 mg | Freq: Once | INTRAMUSCULAR | Status: AC
  Administered 2022-01-18: 4 mg via INTRAVENOUS
  Filled 2022-01-18: qty 2

## 2022-01-18 MED ORDER — HYDROMORPHONE HCL 1 MG/ML IJ SOLN
0.5000 mg | Freq: Once | INTRAMUSCULAR | Status: AC
  Administered 2022-01-18: 0.5 mg via INTRAVENOUS
  Filled 2022-01-18: qty 1

## 2022-01-18 MED ORDER — GABAPENTIN 300 MG PO CAPS
300.0000 mg | ORAL_CAPSULE | Freq: Once | ORAL | Status: AC
  Administered 2022-01-18: 300 mg via ORAL
  Filled 2022-01-18: qty 1

## 2022-01-18 MED ORDER — DIPHENHYDRAMINE HCL 50 MG/ML IJ SOLN
12.5000 mg | Freq: Once | INTRAMUSCULAR | Status: AC
  Administered 2022-01-18: 12.5 mg via INTRAVENOUS
  Filled 2022-01-18: qty 1

## 2022-01-18 NOTE — ED Provider Notes (Signed)
[facility] DEPT Provider Note   CSN: [id number] Arrival date & time: 01/18/22  0712     History  Chief Complaint  Patient presents with   Leg Pain   Foot Pain    [name] is a 26 y.o. female.  COVID but   Leg Pain Foot Pain  Patient presents with pain in both her feet.  States has had for the last couple days.  Burning.  States her doctor told her to come into the ER because it was a neuropathy.  Has not had history of neuropathy.  History of lupus.  History of chronic pain.  Had been in pain management for abdominal pain.  States she was in it for about 2 weeks and then stopped.  Stopped that about 2 weeks ago.  Also states she has been having vaginal bleeding.  States that she has ovarian cysts that people will not operate on.  Reviewing notes it appears that she has had multiple surgeries for ovarian cysts and endometriosis.  Denies pregnancy.  States both feet hurt in the head with some swelling.  States she has been wearing pressure stockings.    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 Authorizing Provider  promethazine (PHENERGAN) 25 MG suppository Place 1 suppository (25 mg total) rectally every 6 (six) hours as needed for nausea or vomiting. 01/18/22  Yes Davonna Belling, MD  ALPRAZolam Duanne Moron) 1 MG tablet Take 1.5 mg by mouth 2 (two) times daily.    [provider]  apixaban (ELIQUIS) 5 MG TABS tablet Take 1 tablet (5 mg total) by mouth 2 (two) times daily. 08/25/21   Cherene Altes, MD  Baclofen 5 MG TABS Take 5 mg by mouth 2 (two) times daily as needed. 12/28/21   Darliss Cheney, MD  cyclobenzaprine (FLEXERIL) 10 MG tablet Take 10 mg by mouth daily. 12/14/21   [provider]  hydroxychloroquine (PLAQUENIL) 200 MG tablet Take 400 mg by mouth daily.    [provider]  norethindrone (AYGESTIN) 5 MG tablet Take 1 tablet (5 mg total) by mouth daily. 12/14/21   Henderly, Britni A, PA-C  ondansetron (ZOFRAN-ODT) 8 MG disintegrating tablet Take 1 tablet (8 mg total) by mouth every 8 (eight) hours as needed for nausea or vomiting. Patient not taking: Reported on 12/28/2021 12/25/21   Davonna Belling, MD  oxyCODONE-acetaminophen (PERCOCET/ROXICET) 5-325 MG tablet Take 1 tablet by mouth every 6 (six) hours as needed for severe pain. 12/25/21   Davonna Belling, MD  promethazine (PHENERGAN) 25 MG tablet Take 1 tablet (25 mg total) by mouth every 6 (six) hours as needed for nausea or vomiting. 12/14/21   Henderly, Britni A, PA-C  zolpidem (AMBIEN) 10 MG tablet Take 20 mg by mouth at bedtime.    [provider]  dicyclomine (BENTYL) 20 MG tablet Take 1 tablet (20 mg total) by mouth 2 (two) times daily. 11/29/18 04/11/19  Muthersbaugh, Jarrett Soho, PA-C  Fluticasone Propionate  HFA (FLOVENT HFA IN) Inhale into the lungs. Patient not taking: Reported on 11/14/2019  11/14/19  [provider]      Allergies    Azithromycin, Peanut-containing drug products, Iodinated contrast media, Other, Amoxicillin, Gluten meal, Lactose intolerance (gi), and Morphine and related    Review of Systems   Review of Systems  Physical Exam Updated Vital Signs BP 128/79 (BP Location: Left Arm)   Pulse 95   Temp 98.6 F (37 C) (Oral)   Resp 18   Ht 5' 1"  (1.549 m)   Wt 99.8 kg   LMP  (LMP Unknown) Comment: takes bc cont  SpO2 97%   BMI 41.57 kg/m  Physical Exam Vitals and nursing note reviewed.  Cardiovascular:     Rate and Rhythm: Regular rhythm.   Abdominal:     Tenderness: There is abdominal tenderness.     Comments: Lower abdominal tenderness without rebound or guarding.  No hernias palpated.  Musculoskeletal:        General: Tenderness present.     Cervical back: Neck supple.     Comments:  mild tenderness to bilateral feet.  Sensation grossly intact.  No erythema.  Pulses intact.  Skin:    Capillary Refill: Capillary refill takes less than 2 seconds.  Neurological:     Mental Status: She is alert and oriented to person, place, and time.     ED Results / Procedures / Treatments   Labs (all labs ordered are listed, but only abnormal results are displayed) Labs Reviewed  BASIC METABOLIC PANEL - Abnormal; Notable for the following components:      Result Value   Chloride 112 (*)    CO2 21 (*)    Glucose, Bld 111 (*)    All other components within normal limits  CBC WITH DIFFERENTIAL/PLATELET - Abnormal; Notable for the following components:   Hemoglobin 10.9 (*)    HCT 34.6 (*)    MCV 73.5 (*)    MCH 23.1 (*)    RDW 18.1 (*)    Platelets 434 (*)    All other components within normal limits  RESP PANEL BY RT-PCR (RSV, FLU A&B, COVID)  RVPGX2  MAGNESIUM  URINALYSIS, ROUTINE W REFLEX MICROSCOPIC  I-STAT BETA HCG BLOOD, ED ([facility], [facility], [facility] ONLY)    EKG None  Radiology No results found.  Procedures Procedures    Medications Ordered in ED Medications  gabapentin (NEURONTIN) capsule 300 mg (300 mg Oral Given 01/18/22 0956)  ondansetron (ZOFRAN) injection 4 mg (4 mg Intravenous Given 01/18/22 1120)  HYDROmorphone (DILAUDID) injection 0.5 mg (0.5 mg Intravenous Given 01/18/22 1415)  diphenhydrAMINE (BENADRYL) injection 12.5 mg (12.5 mg Intravenous Given 01/18/22 1414)    ED Course/ Medical Decision Making/ A&P                           Medical Decision Making Amount and/or Complexity of Data Reviewed Labs: ordered.  Risk Prescription drug management.   Patient with leg pain.  Bilateral side.  States she  was told by her pain management doctor to come in.  States she has been vomiting.  States hurts in both her legs.  States she was told it was nerve pain.  Lab work reassuring.  Also states abdominal pain.  Acute on chronic.  States she had previously been on pain medicine but then left after 2 weeks because she thought she was doing better.  Continued pain.  History of endometriosis and ovarian  cyst.  States she did have some blood in the urine also. Lab work reassuring.  No severe electrolyte abnormality.  No relief with Neurontin given pain medicine.  Instructed on need for outpatient control of her chronic pain.  Will discharge home.        Final Clinical Impression(s) / ED Diagnoses Final diagnoses:  Chronic abdominal pain    Rx / DC Orders ED Discharge Orders          Ordered    promethazine (PHENERGAN) 25 MG suppository  Every 6 hours PRN        01/18/22 1419              Davonna Belling, MD 01/19/22 [phone]

## 2022-01-18 NOTE — Discharge Instructions (Signed)
Follow-up with the pain management doctors to hopefully help you control your pain.

## 2022-01-18 NOTE — ED Triage Notes (Addendum)
Pt reports leg pain and foot pain since Tuesday. Pt states that it feels like she is stepping on coals. Pt has multiple complaints.

## 2022-01-20 ENCOUNTER — Emergency Department (HOSPITAL_BASED_OUTPATIENT_CLINIC_OR_DEPARTMENT_OTHER): Payer: PPO

## 2022-01-20 ENCOUNTER — Emergency Department (HOSPITAL_BASED_OUTPATIENT_CLINIC_OR_DEPARTMENT_OTHER)
Admission: EM | Admit: 2022-01-20 | Discharge: 2022-01-20 | Disposition: A | Discharge status: Home / Self Care | Payer: PPO | Attending: Emergency Medicine | Admitting: Emergency Medicine

## 2022-01-20 ENCOUNTER — Encounter (HOSPITAL_BASED_OUTPATIENT_CLINIC_OR_DEPARTMENT_OTHER): Payer: Self-pay | Admitting: Emergency Medicine

## 2022-01-20 ENCOUNTER — Other Ambulatory Visit: Payer: Self-pay

## 2022-01-20 DIAGNOSIS — Z7901 Long term (current) use of anticoagulants: Secondary | ICD-10-CM | POA: Diagnosis not present

## 2022-01-20 DIAGNOSIS — R1030 Lower abdominal pain, unspecified: Secondary | ICD-10-CM | POA: Insufficient documentation

## 2022-01-20 DIAGNOSIS — R109 Unspecified abdominal pain: Secondary | ICD-10-CM

## 2022-01-20 DIAGNOSIS — Z91010 Allergy to peanuts: Secondary | ICD-10-CM | POA: Diagnosis not present

## 2022-01-20 DIAGNOSIS — R112 Nausea with vomiting, unspecified: Secondary | ICD-10-CM | POA: Insufficient documentation

## 2022-01-20 DIAGNOSIS — J45909 Unspecified asthma, uncomplicated: Secondary | ICD-10-CM | POA: Diagnosis not present

## 2022-01-20 MED ORDER — LORAZEPAM 2 MG/ML IJ SOLN
1.0000 mg | Freq: Once | INTRAMUSCULAR | Status: AC
  Administered 2022-01-20: 1 mg via INTRAVENOUS
  Filled 2022-01-20: qty 1

## 2022-01-20 MED ORDER — KETAMINE HCL 10 MG/ML IJ SOLN
0.3000 mg/kg | Freq: Once | INTRAMUSCULAR | Status: AC
  Administered 2022-01-20: 30 mg via INTRAVENOUS
  Filled 2022-01-20: qty 1

## 2022-01-20 MED ORDER — SODIUM CHLORIDE 0.9 % IV BOLUS
1000.0000 mL | Freq: Once | INTRAVENOUS | Status: AC
  Administered 2022-01-20: 1000 mL via INTRAVENOUS

## 2022-01-20 MED ORDER — ONDANSETRON HCL 4 MG/2ML IJ SOLN
4.0000 mg | Freq: Once | INTRAMUSCULAR | Status: AC
  Administered 2022-01-20: 4 mg via INTRAVENOUS
  Filled 2022-01-20: qty 2

## 2022-01-20 NOTE — Discharge Instructions (Signed)
Your history and exam today are consistent with likely recurrent pain in your lower abdomen from your chronic troubles.  The ultrasound was attempted but unable to be tolerated.  I will note in the chart that you did not tolerate the ketamine very well.  Please go to the appointment in several days with your pain doctor and is soon as possible with the OB/GYN team.  Please rest and stay hydrated and follow-up.  Please use the nausea medicine that you were prescribed previously.

## 2022-01-20 NOTE — ED Provider Notes (Signed)
[facility] Provider Note   CSN: [id number] Arrival date & time: 01/20/22  0320     History  Chief Complaint  Patient presents with   Emesis    [name] is a 26 y.o. female.  HPI     This is a 26 year old female who presents with abdominal pain, nausea, vomiting.  She has a history of the same.  Patient reports history of ovarian cyst and endometriosis.  She states she is difficult to manage and has had multiple surgeries and evaluations.  She was on pain management but has not been in 2 weeks because "I thought I can handle it."  She is currently not taking anything at home.  She did take a dose of ibuprofen but states that she is not supposed to take this because she is on Eliquis for DVTs.  She reports ongoing bilateral lower abdominal pain.  She attributes this to her known ovarian cyst.  She states she was seen and evaluated yesterday but "they did not address my abdominal pain."  Denies fevers, urinary symptoms.  I have reviewed her chart.  She has had multiple ED visits for chronic abdominal pain.  Labs reviewed from yesterday and largely reassuring.   Home Medications Prior to Admission medications   Medication Sig Start Date End Date Taking? Authorizing Provider  ALPRAZolam Duanne Moron) 1 MG tablet Take 1.5 mg by mouth 2 (two) times daily.    [provider]  apixaban (ELIQUIS) 5 MG TABS tablet Take 1 tablet (5 mg total) by mouth 2 (two) times daily. 08/25/21   Cherene Altes, MD  Baclofen 5 MG TABS Take 5 mg by mouth 2 (two) times daily as needed. 12/28/21   Darliss Cheney, MD  cyclobenzaprine (FLEXERIL) 10 MG tablet Take 10 mg by mouth daily. 12/14/21   [provider]  hydroxychloroquine (PLAQUENIL) 200 MG tablet Take 400 mg by mouth daily.    [provider]  norethindrone (AYGESTIN) 5 MG tablet Take 1 tablet (5 mg total) by mouth daily. 12/14/21   Henderly, Britni A, PA-C  ondansetron (ZOFRAN-ODT) 8 MG  disintegrating tablet Take 1 tablet (8 mg total) by mouth every 8 (eight) hours as needed for nausea or vomiting. Patient not taking: Reported on 12/28/2021 12/25/21   Davonna Belling, MD  oxyCODONE-acetaminophen (PERCOCET/ROXICET) 5-325 MG tablet Take 1 tablet by mouth every 6 (six) hours as needed for severe pain. 12/25/21   Davonna Belling, MD  promethazine (PHENERGAN) 25 MG suppository Place 1 suppository (25 mg total) rectally every 6 (six) hours as needed for nausea or vomiting. 01/18/22   Davonna Belling, MD  promethazine (PHENERGAN) 25 MG tablet Take 1 tablet (25 mg total) by mouth every 6 (six) hours as needed for nausea or vomiting. 12/14/21   Henderly, Britni A, PA-C  zolpidem (AMBIEN) 10 MG tablet Take 20 mg by mouth at bedtime.    [provider]  dicyclomine (BENTYL) 20 MG tablet Take 1 tablet (20 mg total) by mouth 2 (two) times daily. 11/29/18 04/11/19  Muthersbaugh, Jarrett Soho, PA-C  Fluticasone Propionate HFA (FLOVENT HFA IN) Inhale into the lungs. Patient not taking: Reported on 11/14/2019  11/14/19  [provider]      Allergies    Azithromycin, Peanut-containing drug products, Iodinated contrast media, Other, Amoxicillin, Gluten meal, Lactose intolerance (gi), and Morphine and related    Review of Systems   Review of Systems  Constitutional:  Negative for fever.  Respiratory:  Negative for shortness of breath.  Cardiovascular:  Negative for chest pain.  Gastrointestinal:  Positive for abdominal pain, nausea and vomiting.  All other systems reviewed and are negative.   Physical Exam Updated Vital Signs BP 105/89   Pulse (!) 123   Temp 98.4 F (36.9 C)   Resp 18   Ht 1.549 m (5' 1" )   Wt 99.8 kg   LMP  (LMP Unknown) Comment: takes bc cont  SpO2 100%   BMI 41.57 kg/m  Physical Exam Vitals and nursing note reviewed.  Constitutional:      Appearance: She is well-developed. She is obese. She is not ill-appearing.  HENT:     Head: Normocephalic  and atraumatic.  Eyes:     Pupils: Pupils are equal, round, and reactive to light.  Cardiovascular:     Rate and Rhythm: Normal rate and regular rhythm.     Heart sounds: Normal heart sounds.  Pulmonary:     Effort: Pulmonary effort is normal. No respiratory distress.     Breath sounds: No wheezing.  Abdominal:     General: Bowel sounds are normal.     Palpations: Abdomen is soft.     Tenderness: There is abdominal tenderness. There is no guarding or rebound.     Comments: Lower abdominal tenderness palpation, no rebound or guarding  Musculoskeletal:     Cervical back: Neck supple.  Skin:    General: Skin is warm and dry.  Neurological:     Mental Status: She is alert and oriented to person, place, and time.  Psychiatric:        Mood and Affect: Mood normal.     ED Results / Procedures / Treatments   Labs (all labs ordered are listed, but only abnormal results are displayed) Labs Reviewed  URINALYSIS, ROUTINE W REFLEX MICROSCOPIC    EKG None  Radiology No results found.  Procedures Procedures    Medications Ordered in ED Medications  sodium chloride 0.9 % bolus 1,000 mL (1,000 mLs Intravenous New Bag/Given 01/20/22 0513)  ondansetron (ZOFRAN) injection 4 mg (4 mg Intravenous Given 01/20/22 0513)  ketamine (KETALAR) injection 30 mg (30 mg Intravenous Given 01/20/22 2542)    ED Course/ Medical Decision Making/ A&P                           Medical Decision Making Amount and/or Complexity of Data Reviewed Labs: ordered. Radiology: ordered.  Risk Prescription drug management.   This patient presents to the ED for concern of abdominal pain, this involves an extensive number of treatment options, and is a complaint that carries with it a high risk of complications and morbidity.  I considered the following differential and admission for this acute, potentially life threatening condition.  The differential diagnosis includes endometriosis, ovarian cysts, SBO,  UTI, appendicitis, cholecystitis, pancreatitis, gastritis  MDM:    This is a 26 year old female who presents with abdominal pain and nausea vomiting.  She is nontoxic vital signs initially notable for heart rate of 123.  Heart rate is in the low 100s on my evaluation.  Abdominal exam is relatively nontender and nonlocalizing.  I have reviewed her chart.  She has had both CT and ultrasound imaging as recently as November.  She had lab work yesterday that was reassuring.  Patient was given a small dose of ketamine and Zofran.  Henry obtain ultrasound to rule out torsion primarily although I have low suspicion given bilateral nature.  Urinalysis is also pending as this  was not obtained yesterday patient reports chills at home.  (Labs, imaging, consults)  Labs: I Ordered, and personally interpreted labs.  The pertinent results include: Urinalysis pending  Imaging Studies ordered: I ordered imaging studies including ultrasound pending I independently visualized and interpreted imaging. I agree with the radiologist interpretation  Additional history obtained from chart review.  External records from outside source obtained and reviewed including imaging  Cardiac Monitoring: The patient was maintained on a cardiac monitor.  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   Collagen vascular disease ([place])    Diarrhea    Patient mentions diagnosis of ulcerative colitis but it is not clear she actually has UC   Endometriosis    Lupus ([place])    Ovarian cyst    Panic  disorder 05/30/2020   Pulmonary embolus ([place]) 02/06/2020   Seizures ([place])      Medicines Meds ordered this encounter  Medications   sodium chloride 0.9 % bolus 1,000 mL   ondansetron (ZOFRAN) injection 4 mg   ketamine (KETALAR) injection 30 mg    I have reviewed the patients home medicines and have made adjustments as needed  Problem List / ED Course: Problem List Items Addressed This Visit   None               Final Clinical Impression(s) / ED Diagnoses Final diagnoses:  None    Rx / DC Orders ED Discharge Orders     None         Merryl Hacker, MD 01/20/22 [phone]

## 2022-01-20 NOTE — ED Provider Notes (Signed)
7:20 AM Care sent from Dr. Dina Rich.  At time of transfer of care, patient is waiting for results of ultrasound and urinalysis.  Previous team suspected chronic pain that can likely be managed by outpatient pain management team primarily.  If workup reassuring, anticipate discharge home.   Patient was unable to tolerate the ultrasound to get all the images.  We gave her some Ativan to help her relax and she still did not tolerate getting all the images.  That being said, I reiterated that the plan today was to avoid narcotic pain medicine given the recurrent and chronic nature of her pain.  She did not tolerate the ketamine well she reports so we will avoid that future.  After discussion, patient agrees to discharge home as she is seeing her pain doctor in the next several days and is scheduled see OB/GYN as well.  She understood return precautions and follow-up instructions and was discharged in stable condition.  Clinical Impression: 1. Abdominal pain, unspecified abdominal location     Disposition: Discharge  Condition: Good  I have discussed the results, Dx and Tx plan with the pt(& family if present). He/she/they expressed understanding and agree(s) with the plan. Discharge instructions discussed at great length. Strict return precautions discussed and pt &/or family have verbalized understanding of the instructions. No further questions at time of discharge.    New Prescriptions   No medications on file    Follow Up: Go to your pain doctor and your OB/GYN        Tayveon Lombardo, Gwenyth Allegra, MD 01/20/22 [phone]

## 2022-01-20 NOTE — ED Notes (Signed)
Patient transported to Ultrasound 

## 2022-01-20 NOTE — ED Notes (Signed)
Patient initially refused transvaginal ultrasound unless she got something for pain. I spoke w/doctor and patient was given adavan. Patient wasn't happy about that but agreed to get scan done. After performing transabdominal, transvaginal was attempted 2x with both tech and then patient trying to insert camera. Patient states she cannot continue without pain meds. A chaperone Fayrene Helper) was witness to all that occurred in [place] room. I spoke with Dr. Sherry Ruffing who is fine canceling the transvaginal/torsion orders and just sending a limited transabdominal study. He will talk to patient about changes. RN also made aware after patient was returned to room.

## 2022-01-20 NOTE — ED Triage Notes (Signed)
Pt c/o N/V/D with intermittent fevers x 3 days.

## 2022-02-08 ENCOUNTER — Ambulatory Visit: Payer: PPO | Admitting: Obstetrics and Gynecology

## 2022-02-12 ENCOUNTER — Encounter: Payer: Self-pay | Admitting: Obstetrics and Gynecology

## 2022-02-12 ENCOUNTER — Emergency Department (HOSPITAL_COMMUNITY): Payer: PPO

## 2022-02-12 ENCOUNTER — Emergency Department (HOSPITAL_COMMUNITY)
Admission: EM | Admit: 2022-02-12 | Discharge: 2022-02-12 | Disposition: A | Discharge status: Home / Self Care | Payer: PPO | Attending: Emergency Medicine | Admitting: Emergency Medicine

## 2022-02-12 DIAGNOSIS — R319 Hematuria, unspecified: Secondary | ICD-10-CM | POA: Insufficient documentation

## 2022-02-12 DIAGNOSIS — G8929 Other chronic pain: Secondary | ICD-10-CM | POA: Diagnosis not present

## 2022-02-12 DIAGNOSIS — Z91010 Allergy to peanuts: Secondary | ICD-10-CM | POA: Diagnosis not present

## 2022-02-12 DIAGNOSIS — R1084 Generalized abdominal pain: Secondary | ICD-10-CM | POA: Insufficient documentation

## 2022-02-12 LAB — COMPREHENSIVE METABOLIC PANEL
ALT: 16 U/L (ref 0–44)
AST: 19 U/L (ref 15–41)
Albumin: 4.3 g/dL (ref 3.5–5.0)
Alkaline Phosphatase: 89 U/L (ref 38–126)
Anion gap: 9 (ref 5–15)
BUN: 10 mg/dL (ref 6–20)
CO2: 21 mmol/L — ABNORMAL LOW (ref 22–32)
Calcium: 8.9 mg/dL (ref 8.9–10.3)
Chloride: 107 mmol/L (ref 98–111)
Creatinine, Ser: 0.79 mg/dL (ref 0.44–1.00)
GFR, Estimated: >60 mL/min (ref >60)
Glucose, Bld: 98 mg/dL (ref 70–99)
Potassium: 3.6 mmol/L (ref 3.5–5.1)
Sodium: 137 mmol/L (ref 135–145)
Total Bilirubin: 0.2 mg/dL — ABNORMAL LOW (ref 0.3–1.2)
Total Protein: 7.6 g/dL (ref 6.5–8.1)

## 2022-02-12 LAB — CBC WITH DIFFERENTIAL/PLATELET
Abs Immature Granulocytes: 0.02 10*3/uL (ref 0.00–0.07)
Basophils Absolute: 0 10*3/uL (ref 0.0–0.1)
Basophils Relative: 0 %
Eosinophils Absolute: 0.1 10*3/uL (ref 0.0–0.5)
Eosinophils Relative: 2 %
HCT: 33.9 % — ABNORMAL LOW (ref 36.0–46.0)
Hemoglobin: 10.5 g/dL — ABNORMAL LOW (ref 12.0–15.0)
Immature Granulocytes: 0 %
Lymphocytes Relative: 34 %
Lymphs Abs: 2.8 10*3/uL (ref 0.7–4.0)
MCH: 22.5 pg — ABNORMAL LOW (ref 26.0–34.0)
MCHC: 31 g/dL (ref 30.0–36.0)
MCV: 72.6 fL — ABNORMAL LOW (ref 80.0–100.0)
Monocytes Absolute: 0.4 10*3/uL (ref 0.1–1.0)
Monocytes Relative: 5 %
Neutro Abs: 4.8 10*3/uL (ref 1.7–7.7)
Neutrophils Relative %: 59 %
Platelets: 472 10*3/uL — ABNORMAL HIGH (ref 150–400)
RBC: 4.67 MIL/uL (ref 3.87–5.11)
RDW: 19.8 % — ABNORMAL HIGH (ref 11.5–15.5)
WBC: 8.1 10*3/uL (ref 4.0–10.5)
nRBC: 0.9 % — ABNORMAL HIGH (ref 0.0–0.2)

## 2022-02-12 LAB — URINALYSIS, ROUTINE W REFLEX MICROSCOPIC
Bilirubin Urine: NEGATIVE
Glucose, UA: NEGATIVE mg/dL
Hgb urine dipstick: NEGATIVE
Ketones, ur: NEGATIVE mg/dL
Leukocytes,Ua: NEGATIVE
Nitrite: NEGATIVE
Protein, ur: NEGATIVE mg/dL
Specific Gravity, Urine: 1.023 (ref 1.005–1.030)
pH: 5 (ref 5.0–8.0)

## 2022-02-12 LAB — TSH: TSH: 1.427 u[IU]/mL (ref 0.350–4.500)

## 2022-02-12 LAB — LIPASE, BLOOD: Lipase: 41 U/L (ref 11–51)

## 2022-02-12 LAB — PREGNANCY, URINE: Preg Test, Ur: NEGATIVE

## 2022-02-12 MED ORDER — ONDANSETRON HCL 4 MG/2ML IJ SOLN
4.0000 mg | Freq: Once | INTRAMUSCULAR | Status: AC
  Administered 2022-02-12: 4 mg via INTRAVENOUS
  Filled 2022-02-12: qty 2

## 2022-02-12 MED ORDER — SODIUM CHLORIDE 0.9 % IV BOLUS
1000.0000 mL | Freq: Once | INTRAVENOUS | Status: AC
  Administered 2022-02-12: 1000 mL via INTRAVENOUS

## 2022-02-12 MED ORDER — KETOROLAC TROMETHAMINE 15 MG/ML IJ SOLN
15.0000 mg | Freq: Once | INTRAMUSCULAR | Status: AC
  Administered 2022-02-12: 15 mg via INTRAVENOUS
  Filled 2022-02-12: qty 1

## 2022-02-12 MED ORDER — DIPHENHYDRAMINE HCL 50 MG/ML IJ SOLN
50.0000 mg | Freq: Once | INTRAMUSCULAR | Status: AC
  Administered 2022-02-12: 50 mg via INTRAVENOUS
  Filled 2022-02-12: qty 1

## 2022-02-12 MED ORDER — METOCLOPRAMIDE HCL 5 MG/ML IJ SOLN
10.0000 mg | Freq: Once | INTRAMUSCULAR | Status: AC
  Administered 2022-02-12: 2 mg via INTRAVENOUS
  Filled 2022-02-12: qty 2

## 2022-02-12 MED ORDER — LORAZEPAM 2 MG/ML IJ SOLN
0.5000 mg | Freq: Once | INTRAMUSCULAR | Status: AC
  Administered 2022-02-12: 0.5 mg via INTRAVENOUS
  Filled 2022-02-12: qty 1

## 2022-02-12 NOTE — ED Provider Notes (Signed)
[facility] DEPARTMENT AT [facility] Provider Note   CSN: [id number] Arrival date & time: 02/12/22  3810     History  Chief Complaint  Patient presents with   Hematuria    Maki Hege is a 27 y.o. female.  27 year old female with prior medical history as detailed below presents for evaluation.  Patient with self-reported chronic abdominal pain and discomfort.  Patient reports that for the last week she has been having intermittent blood in her urine.  She feels that she may have been passing some renal stones.  She denies fever.  She denies vomiting.  She does report diffuse lower abdominal pain.  She suffers from chronic abdominal discomfort.  She reports that she has not had any narcotics at home for treatment of her pain for quite some time.  She has been referred to pain management but she decided not to go to them over the last month.  She is concerned that her thyroid may be enlarged.  She is requesting a TSH recheck.  She reports that she already has a thyroid ultrasound scheduled in the outpatient setting.  The history is provided by the patient and medical records.       Home Medications Prior to Admission medications   Medication Sig Start Date End Date Taking? Authorizing Provider  ALPRAZolam Duanne Moron) 1 MG tablet Take 1.5 mg by mouth 2 (two) times daily.    [provider]  apixaban (ELIQUIS) 5 MG TABS tablet Take 1 tablet (5 mg total) by mouth 2 (two) times daily. 08/25/21   Cherene Altes, MD  Baclofen 5 MG TABS Take 5 mg by mouth 2 (two) times daily as needed. 12/28/21   Darliss Cheney, MD  cyclobenzaprine (FLEXERIL) 10 MG tablet Take 10 mg by mouth daily. 12/14/21   [provider]  hydroxychloroquine (PLAQUENIL) 200 MG tablet Take 400 mg by mouth daily.    [provider]  norethindrone (AYGESTIN) 5 MG tablet Take 1 tablet (5 mg total) by mouth daily. 12/14/21   Henderly, Britni A, PA-C  ondansetron  (ZOFRAN-ODT) 8 MG disintegrating tablet Take 1 tablet (8 mg total) by mouth every 8 (eight) hours as needed for nausea or vomiting. Patient not taking: Reported on 12/28/2021 12/25/21   Davonna Belling, MD  oxyCODONE-acetaminophen (PERCOCET/ROXICET) 5-325 MG tablet Take 1 tablet by mouth every 6 (six) hours as needed for severe pain. 12/25/21   Davonna Belling, MD  promethazine (PHENERGAN) 25 MG suppository Place 1 suppository (25 mg total) rectally every 6 (six) hours as needed for nausea or vomiting. 01/18/22   Davonna Belling, MD  promethazine (PHENERGAN) 25 MG tablet Take 1 tablet (25 mg total) by mouth every 6 (six) hours as needed for nausea or vomiting. 12/14/21   Henderly, Britni A, PA-C  zolpidem (AMBIEN) 10 MG tablet Take 20 mg by mouth at bedtime.    [provider]  dicyclomine (BENTYL) 20 MG tablet Take 1 tablet (20 mg total) by mouth 2 (two) times daily. 11/29/18 04/11/19  Muthersbaugh, Jarrett Soho, PA-C  Fluticasone Propionate HFA (FLOVENT HFA IN) Inhale into the lungs. Patient not taking: Reported on 11/14/2019  11/14/19  [provider]      Allergies    Azithromycin, Peanut-containing drug products, Iodinated contrast media, Other, Amoxicillin, Gluten meal, Lactose intolerance (gi), and Morphine and related    Review of Systems   Review of Systems  All other systems reviewed and are negative.   Physical Exam Updated Vital Signs BP Marland Kitchen)  142/112 (BP Location: Right Arm)   Pulse (!) 114   Temp 98.2 F (36.8 C) (Oral)   Resp 18   Ht '5\' 1"'$  (1.549 m)   Wt 99.8 kg   SpO2 97%   BMI 41.57 kg/m  Physical Exam Vitals and nursing note reviewed.  Constitutional:      General: She is not in acute distress.    Appearance: Normal appearance. She is well-developed.  HENT:     Head: Normocephalic and atraumatic.  Eyes:     Conjunctiva/sclera: Conjunctivae normal.     Pupils: Pupils are equal, round, and reactive to light.  Cardiovascular:     Rate and Rhythm:  Normal rate and regular rhythm.     Heart sounds: Normal heart sounds.  Pulmonary:     Effort: Pulmonary effort is normal. No respiratory distress.     Breath sounds: Normal breath sounds.  Abdominal:     General: There is no distension.     Palpations: Abdomen is soft.     Tenderness: There is no abdominal tenderness.  Musculoskeletal:        General: No deformity. Normal range of motion.     Cervical back: Normal range of motion and neck supple.  Skin:    General: Skin is warm and dry.  Neurological:     General: No focal deficit present.     Mental Status: She is alert and oriented to person, place, and time.     ED Results / Procedures / Treatments   Labs (all labs ordered are listed, but only abnormal results are displayed) Labs Reviewed  URINALYSIS, ROUTINE W REFLEX MICROSCOPIC - Abnormal; Notable for the following components:      Result Value   APPearance HAZY (*)    All other components within normal limits  PREGNANCY, URINE  CBC WITH DIFFERENTIAL/PLATELET  LIPASE, BLOOD  COMPREHENSIVE METABOLIC PANEL  TSH    EKG None  Radiology CT ABDOMEN PELVIS WO CONTRAST  Result Date: 02/12/2022 CLINICAL DATA:  Bilateral flank pain. EXAM: CT ABDOMEN AND PELVIS WITHOUT CONTRAST TECHNIQUE: Multidetector CT imaging of the abdomen and pelvis was performed following the standard protocol without IV contrast. RADIATION DOSE REDUCTION: This exam was performed according to the departmental dose-optimization program which includes automated exposure control, adjustment of the mA and/or kV according to patient size and/or use of iterative reconstruction technique. COMPARISON:  December 14, 2021. FINDINGS: Lower chest: No acute abnormality. Hepatobiliary: Stable right hepatic cyst. Status post cholecystectomy. No biliary dilatation. Pancreas: Unremarkable. No pancreatic ductal dilatation or surrounding inflammatory changes. Spleen: Normal in size without focal abnormality. Adrenals/Urinary  Tract: Adrenal glands are unremarkable. Kidneys are normal, without renal calculi, focal lesion, or hydronephrosis. Bladder is unremarkable. Stomach/Bowel: Stomach is unremarkable. There is no evidence of bowel obstruction or inflammation. Status post appendectomy. Vascular/Lymphatic: No significant vascular findings are present. No enlarged abdominal or pelvic lymph nodes. Reproductive: Uterus and left adnexal regions are unremarkable. 5.2 cm right ovarian cyst is noted which is best seen on image number 58 of series 6. Other: No abdominal wall hernia or abnormality. No abdominopelvic ascites. Musculoskeletal: No acute or significant osseous findings. IMPRESSION: 5.2 cm right ovarian cyst. Follow-up by [place] is recommended in 3-6 months. Note: This recommendation does not apply to premenarchal patients and to those with increased risk (genetic, family history, elevated tumor markers or other high-risk factors) of ovarian cancer. Reference: JACR 2020 Feb; 17(2):248-254. No other acute abnormality seen in the abdomen or pelvis. Electronically Signed   By:  Marijo Conception M.D.   On: 02/12/2022 08:39    Procedures Procedures    Medications Ordered in ED Medications  sodium chloride 0.9 % bolus 1,000 mL (has no administration in time range)  metoCLOPramide (REGLAN) injection 10 mg (has no administration in time range)  diphenhydrAMINE (BENADRYL) injection 50 mg (has no administration in time range)  ketorolac (TORADOL) 15 MG/ML injection 15 mg (has no administration in time range)    ED Course/ Medical Decision Making/ A&P                             Medical Decision Making Amount and/or Complexity of Data Reviewed Labs: ordered. Radiology: ordered.  Risk Prescription drug management.    Medical Screen Complete  This patient presented to the ED with complaint of abdominal pain, hematuria.  This complaint involves an extensive number of treatment options. The initial differential diagnosis  includes, but is not limited to, renal colic, UTI, metabolic abnormality, other intra-abdominal pathology, etc.  This presentation is: Acute, Chronic, Self-Limited, Previously Undiagnosed, Uncertain Prognosis, Complicated, and Systemic Symptoms  Patient with longstanding history of chronic abdominal pain presents with complaint of apparent intermittent hematuria and intermittent flank pain.  Patient's exam is without significant abnormality.  Patient is complaining of diffuse migratory abdominal pain for the last 4 to 5 days.  Screening labs obtained are without significant abnormality.  Notably patient's UA is without evidence of hematuria.  CBC and CMP and lipase are also without significant abnormality.  Patient requested TSH which is within normal limits.    CT imaging is without acute pathology.  Patient with known history of ovarian cyst.  Patient has established outpatient GYN care for same.  After ED evaluation and treatment she feels improved.  She desires discharge home.  She does have established outpatient follow-up both with GYN and also with pain management.  She plans on talking to her pain management providers today given that she has not seen them in the last month or 2 and is out of some of her previously prescribed pain occasions. Additional history obtained:  External records from outside sources obtained and reviewed including prior ED visits and prior Inpatient records.    Lab Tests:  I ordered and personally interpreted labs.  The pertinent results include:  cbc cmp ua lipase   Imaging Studies ordered:  I ordered imaging studies including ct ap  I independently visualized and interpreted obtained imaging which showed nad I agree with the radiologist interpretation.   Problem List / ED Course:  Abdominal pain   Reevaluation:  After the interventions noted above, I reevaluated the patient and found that they have: improved   Disposition:  After  consideration of the diagnostic results and the patients response to treatment, I feel that the patent would benefit from close outpatient followup.          Final Clinical Impression(s) / ED Diagnoses Final diagnoses:  Generalized abdominal pain    Rx / DC Orders ED Discharge Orders     None         Valarie Merino, MD 02/12/22 1146

## 2022-02-12 NOTE — Discharge Instructions (Signed)
Return for any problem.  ?

## 2022-02-12 NOTE — ED Triage Notes (Signed)
Pt reports that she has been passing kidney stones for "a week." Endorses hematuria, lower abdomen, and flank pain.

## 2022-02-12 NOTE — ED Notes (Signed)
Patient transported to CT 

## 2022-02-12 NOTE — Telephone Encounter (Signed)
Routing to Dr. Quincy Simmonds to review.

## 2022-06-21 DIAGNOSIS — S60222A Contusion of left hand, initial encounter: Secondary | ICD-10-CM | POA: Insufficient documentation

## 2022-07-28 DIAGNOSIS — K922 Gastrointestinal hemorrhage, unspecified: Secondary | ICD-10-CM | POA: Insufficient documentation

## 2022-08-27 ENCOUNTER — Encounter (HOSPITAL_COMMUNITY): Payer: Self-pay

## 2022-08-27 ENCOUNTER — Emergency Department (HOSPITAL_COMMUNITY)
Admission: EM | Admit: 2022-08-27 | Discharge: 2022-08-27 | Disposition: A | Discharge status: Home / Self Care | Payer: PPO | Attending: Emergency Medicine | Admitting: Emergency Medicine

## 2022-08-27 ENCOUNTER — Other Ambulatory Visit: Payer: Self-pay

## 2022-08-27 DIAGNOSIS — R1084 Generalized abdominal pain: Secondary | ICD-10-CM | POA: Insufficient documentation

## 2022-08-27 DIAGNOSIS — R112 Nausea with vomiting, unspecified: Secondary | ICD-10-CM | POA: Insufficient documentation

## 2022-08-27 DIAGNOSIS — Z91010 Allergy to peanuts: Secondary | ICD-10-CM | POA: Insufficient documentation

## 2022-08-27 DIAGNOSIS — D75839 Thrombocytosis, unspecified: Secondary | ICD-10-CM | POA: Insufficient documentation

## 2022-08-27 DIAGNOSIS — J45909 Unspecified asthma, uncomplicated: Secondary | ICD-10-CM | POA: Insufficient documentation

## 2022-08-27 DIAGNOSIS — R1030 Lower abdominal pain, unspecified: Secondary | ICD-10-CM

## 2022-08-27 DIAGNOSIS — E876 Hypokalemia: Secondary | ICD-10-CM | POA: Insufficient documentation

## 2022-08-27 DIAGNOSIS — Z7901 Long term (current) use of anticoagulants: Secondary | ICD-10-CM | POA: Insufficient documentation

## 2022-08-27 LAB — CBC
HCT: 38.9 % (ref 36.0–46.0)
Hemoglobin: 12.6 g/dL (ref 12.0–15.0)
MCH: 23.8 pg — ABNORMAL LOW (ref 26.0–34.0)
MCHC: 32.4 g/dL (ref 30.0–36.0)
MCV: 73.4 fL — ABNORMAL LOW (ref 80.0–100.0)
Platelets: 437 10*3/uL — ABNORMAL HIGH (ref 150–400)
RBC: 5.3 MIL/uL — ABNORMAL HIGH (ref 3.87–5.11)
RDW: 16.5 % — ABNORMAL HIGH (ref 11.5–15.5)
WBC: 6.8 10*3/uL (ref 4.0–10.5)
nRBC: 0 % (ref 0.0–0.2)

## 2022-08-27 LAB — COMPREHENSIVE METABOLIC PANEL
ALT: 12 U/L (ref 0–44)
AST: 15 U/L (ref 15–41)
Albumin: 4.1 g/dL (ref 3.5–5.0)
Alkaline Phosphatase: 78 U/L (ref 38–126)
Anion gap: 10 (ref 5–15)
BUN: 6 mg/dL (ref 6–20)
CO2: 20 mmol/L — ABNORMAL LOW (ref 22–32)
Calcium: 9.1 mg/dL (ref 8.9–10.3)
Chloride: 109 mmol/L (ref 98–111)
Creatinine, Ser: 0.66 mg/dL (ref 0.44–1.00)
GFR, Estimated: >60 mL/min (ref >60)
Glucose, Bld: 96 mg/dL (ref 70–99)
Potassium: 3.3 mmol/L — ABNORMAL LOW (ref 3.5–5.1)
Sodium: 139 mmol/L (ref 135–145)
Total Bilirubin: 0.4 mg/dL (ref 0.3–1.2)
Total Protein: 7.5 g/dL (ref 6.5–8.1)

## 2022-08-27 LAB — LIPASE, BLOOD: Lipase: 35 U/L (ref 11–51)

## 2022-08-27 LAB — URINALYSIS, ROUTINE W REFLEX MICROSCOPIC
Bilirubin Urine: NEGATIVE
Glucose, UA: NEGATIVE mg/dL
Hgb urine dipstick: NEGATIVE
Ketones, ur: NEGATIVE mg/dL
Leukocytes,Ua: NEGATIVE
Nitrite: NEGATIVE
Protein, ur: NEGATIVE mg/dL
Specific Gravity, Urine: 1.015 (ref 1.005–1.030)
pH: 5 (ref 5.0–8.0)

## 2022-08-27 LAB — HCG, SERUM, QUALITATIVE: Preg, Serum: NEGATIVE

## 2022-08-27 MED ORDER — POTASSIUM CHLORIDE CRYS ER 20 MEQ PO TBCR
40.0000 meq | EXTENDED_RELEASE_TABLET | Freq: Once | ORAL | Status: AC
  Administered 2022-08-27: 40 meq via ORAL
  Filled 2022-08-27: qty 2

## 2022-08-27 MED ORDER — PANTOPRAZOLE SODIUM 20 MG PO TBEC: 20.0000 mg | DELAYED_RELEASE_TABLET | Freq: Every day | ORAL | 0 refills | Status: DC

## 2022-08-27 MED ORDER — ONDANSETRON 4 MG PO TBDP: 4.0000 mg | ORAL_TABLET | Freq: Three times a day (TID) | ORAL | 0 refills | Status: DC | PRN

## 2022-08-27 MED ORDER — ONDANSETRON HCL 4 MG/2ML IJ SOLN
4.0000 mg | Freq: Once | INTRAMUSCULAR | Status: AC
  Administered 2022-08-27: 4 mg via INTRAVENOUS
  Filled 2022-08-27: qty 2

## 2022-08-27 MED ORDER — SODIUM CHLORIDE 0.9 % IV BOLUS
1000.0000 mL | Freq: Once | INTRAVENOUS | Status: AC
  Administered 2022-08-27: 1000 mL via INTRAVENOUS

## 2022-08-27 MED ORDER — ALUM & MAG HYDROXIDE-SIMETH 200-200-20 MG/5ML PO SUSP
30.0000 mL | Freq: Once | ORAL | Status: AC
  Administered 2022-08-27: 30 mL via ORAL
  Filled 2022-08-27: qty 30

## 2022-08-27 MED ORDER — SODIUM CHLORIDE 0.9% FLUSH: 3.0000 mL | Freq: Once | INTRAVENOUS | Status: DC

## 2022-08-27 MED ORDER — PANTOPRAZOLE SODIUM 40 MG IV SOLR
40.0000 mg | Freq: Once | INTRAVENOUS | Status: AC
  Administered 2022-08-27: 40 mg via INTRAVENOUS
  Filled 2022-08-27: qty 10

## 2022-08-27 MED ORDER — MORPHINE SULFATE (PF) 4 MG/ML IV SOLN
4.0000 mg | Freq: Once | INTRAVENOUS | Status: AC
  Administered 2022-08-27: 4 mg via INTRAVENOUS
  Filled 2022-08-27: qty 1

## 2022-08-27 NOTE — ED Triage Notes (Signed)
Pt coming for generalized abd pain that has been going on for apprx 2 days. Pt then began having emesis episodes yesterday, and noted some blood in it. Hx of cancer and stomach ulcers. Pt states this feels similar to the cancer/ulcer pain. Pt has been taking NSAID's at home to manage pain.

## 2022-08-27 NOTE — ED Notes (Signed)
Pt. Was unable to provide urine sample at this time.

## 2022-08-27 NOTE — Discharge Instructions (Addendum)
Please follow-up with your OB/GYN and primary care provider.  I have given you the information for an OB/GYN office here in [place] if needed.  Do not take any more ibuprofen as this is not a recommended medication when you are on Eliquis.  Use Zofran as needed for nausea as frequently as once every 8 hours as prescribed.

## 2022-08-27 NOTE — ED Notes (Signed)
Pt is unable to void at this time,

## 2022-08-27 NOTE — ED Provider Notes (Signed)
[place] EMERGENCY DEPARTMENT AT [facility] Provider Note   CSN: [id number] Arrival date & time: 08/27/22  1133     History  Chief Complaint  Patient presents with   Abdominal Pain    [name] is a 27 y.o. female.  With past medical history of seizures, celiac's disease, asthma, PE on Eliquis who presents to the emergency department with abdominal pain.  States she is having abdominal pain that began last night. She describes the pain as periumbilical but moving up to her epigastrium. She states it is severe. It is non-radiating. She has had associated nausea and vomiting. Notes that she has had blood tinged emesis. States she threw up 4 times yesterday. Has taken Zofran which does help intermittently. No fevers, diarrhea, dysuria or vaginal discharge. She is not sexually active. She notes that she has had gastritis before requiring EGD and was told not to take NSAIDs. States she has been taking 8 ibuprofen a day. She is also anticoagulated on Eliquis and notes she knows she shouldn't be taking NSAIDs but tylenol has not been helpful.    Abdominal Pain Associated symptoms: nausea and vomiting        Home Medications Prior to Admission medications   Medication Sig Start Date End Date Taking? Authorizing Provider  ALPRAZolam Prudy Feeler) 1 MG tablet Take 1.5 mg by mouth 2 (two) times daily.    [provider]  apixaban (ELIQUIS) 5 MG TABS tablet Take 1 tablet (5 mg total) by mouth 2 (two) times daily. 08/25/21   Lonia Blood, MD  Baclofen 5 MG TABS Take 5 mg by mouth 2 (two) times daily as needed. 12/28/21   Lorriane Shire, MD  cyclobenzaprine (FLEXERIL) 10 MG tablet Take 10 mg by mouth daily. 12/14/21   [provider]  hydroxychloroquine (PLAQUENIL) 200 MG tablet Take 400 mg by mouth daily.    [provider]  norethindrone (AYGESTIN) 5 MG tablet Take 1 tablet (5 mg total) by mouth daily. 12/14/21   Henderly, Britni A, PA-C   ondansetron (ZOFRAN-ODT) 8 MG disintegrating tablet Take 1 tablet (8 mg total) by mouth every 8 (eight) hours as needed for nausea or vomiting. Patient not taking: Reported on 12/28/2021 12/25/21   Benjiman Core, MD  oxyCODONE-acetaminophen (PERCOCET/ROXICET) 5-325 MG tablet Take 1 tablet by mouth every 6 (six) hours as needed for severe pain. 12/25/21   Benjiman Core, MD  promethazine (PHENERGAN) 25 MG suppository Place 1 suppository (25 mg total) rectally every 6 (six) hours as needed for nausea or vomiting. 01/18/22   Benjiman Core, MD  promethazine (PHENERGAN) 25 MG tablet Take 1 tablet (25 mg total) by mouth every 6 (six) hours as needed for nausea or vomiting. 12/14/21   Henderly, Britni A, PA-C  zolpidem (AMBIEN) 10 MG tablet Take 20 mg by mouth at bedtime.    [provider]  dicyclomine (BENTYL) 20 MG tablet Take 1 tablet (20 mg total) by mouth 2 (two) times daily. 11/29/18 04/11/19  Muthersbaugh, Dahlia Client, PA-C  Fluticasone Propionate HFA (FLOVENT HFA IN) Inhale into the lungs. Patient not taking: Reported on 11/14/2019  11/14/19  [provider]      Allergies    Azithromycin, Peanut-containing drug products, Iodinated contrast media, Other, Amoxicillin, Gluten meal, Lactose intolerance (gi), and Morphine and codeine    Review of Systems   Review of Systems  Gastrointestinal:  Positive for abdominal pain, nausea and vomiting.  All other systems reviewed and are negative.   Physical Exam  Updated Vital Signs BP (!) 149/93   Pulse 94   Temp 98.6 F (37 C) (Oral)   Resp 18   Ht 5\' 2"  (1.575 m)   Wt 115.7 kg   SpO2 100%   BMI 46.64 kg/m  Physical Exam Vitals and nursing note reviewed.  Constitutional:      General: She is not in acute distress.    Appearance: Normal appearance. She is obese. She is not ill-appearing or toxic-appearing.  HENT:     Head: Normocephalic.  Eyes:     General: No scleral icterus. Cardiovascular:     Rate and Rhythm:  Normal rate and regular rhythm.     Heart sounds: Normal heart sounds.  Pulmonary:     Effort: Pulmonary effort is normal.     Breath sounds: Normal breath sounds.  Abdominal:     General: Abdomen is protuberant. Bowel sounds are normal. There is no distension.     Palpations: Abdomen is soft.     Tenderness: There is generalized abdominal tenderness.  Skin:    General: Skin is warm and dry.     Capillary Refill: Capillary refill takes less than 2 seconds.  Neurological:     General: No focal deficit present.     Mental Status: She is alert and oriented to person, place, and time.  Psychiatric:        Mood and Affect: Mood normal.        Behavior: Behavior normal.     ED Results / Procedures / Treatments   Labs (all labs ordered are listed, but only abnormal results are displayed) Labs Reviewed  COMPREHENSIVE METABOLIC PANEL - Abnormal; Notable for the following components:      Result Value   Potassium 3.3 (*)    CO2 20 (*)    All other components within normal limits  CBC - Abnormal; Notable for the following components:   RBC 5.30 (*)    MCV 73.4 (*)    MCH 23.8 (*)    RDW 16.5 (*)    Platelets 437 (*)    All other components within normal limits  LIPASE, BLOOD  HCG, SERUM, QUALITATIVE  URINALYSIS, ROUTINE W REFLEX MICROSCOPIC    EKG None  Radiology No results found.  Procedures Procedures   Medications Ordered in ED Medications  sodium chloride flush (NS) 0.9 % injection 3 mL (3 mLs Intravenous Not Given 08/27/22 1347)  sodium chloride 0.9 % bolus 1,000 mL (has no administration in time range)  ondansetron (ZOFRAN) injection 4 mg (has no administration in time range)  pantoprazole (PROTONIX) injection 40 mg (has no administration in time range)  alum & mag hydroxide-simeth (MAALOX/MYLANTA) 200-200-20 MG/5ML suspension 30 mL (has no administration in time range)  potassium chloride SA (KLOR-CON M) CR tablet 40 mEq (has no administration in time range)     ED Course/ Medical Decision Making/ A&P    Medical Decision Making Amount and/or Complexity of Data Reviewed Labs: ordered.  Risk OTC drugs. Prescription drug management.  Initial Impression and Ddx 27 year old female who presents to the emergency department with abdominal pain Patient PMH that increases complexity of ED encounter: Celiac disease, asthma, PE on Eliquis, cluster B personality disorder Differential: Acute hepatobiliary disease, pancreatitis, appendicitis, PUD, gastritis, SBO, diverticulitis, colitis, viral gastroenteritis, Crohn's, UC, vascular catastrophe, UTI, pyelonephritis, renal stone, obstructed stone, infected stone, ovarian torsion, ectopic pregnancy, TOA, PID, STD, etc.   Interpretation of Diagnostics I independent reviewed and interpreted the labs as followed: RBC and platelets elevated ?  hemoconcentration. CMP without significant electrolyte derangement, AKI, transaminitis. Not pregnant. Lipase negative  - I independently visualized the following imaging with scope of interpretation limited to determining acute life threatening conditions related to emergency care: not indicated   Patient Reassessment and Ultimate Disposition/Management 27 year old female who presents to the emergency department with abdominal pain. She has some diffuse pain. Non-peritonitic abdomen. Vitals are stable here, mildly tachycardic.   Will start with labs, IV protonix, GI cocktail, fluids, Zofran and reassess. She has epigastric abdominal tenderness, blood tinged emesis with a history of gastritis. Recent daily use of ibuprofen. Her symptoms are not reminiscent of SBO, acute hepatobiliary disease. Lipase is negative so doubt pancreatitis. Not consistent with diverticulitis, stone, obstructed stone, infected stone. Denies vaginal discharge or fever or lower abdominal pain concerning for STI, PID. TOA. Not pregnant so doubt ectopic. Symptoms also not consistent with appendicitis and has  history of appendectomy.   Of note, this patient has cluster B personality disorder. She states she has had cancer and been through rounds of chemotherapy. I am unable to find any documentation of this to verify. She states it was in [place]. I have many charts from the [place] area including one from gyn onc which states "no cancer diagnosis." She does have history of ovarian cysts and advanced endometriosis. She does have EGD pictures and diagnosis of gastritis previous, which I am most concerned of given her more epigastric abdominal pain, blood tinged vomiting with recent use of NSAIDs.  I am handing off care to [facility], PA-C at change of shift. He will follow-up on pain and nausea control after IVF, zofran, GI cocktail and IV protonix. Please see his note for completion of care.   Patient management required discussion with the following services or consulting groups:  None  Complexity of Problems Addressed Chronic illness with exacerbation  Additional Data Reviewed and Analyzed Further history obtained from: Past medical history and medications listed in the EMR, Prior ED visit notes, and Care Everywhere  Patient Encounter Risk Assessment SDOH impact on management  Final Clinical Impression(s) / ED Diagnoses Final diagnoses:  None    Rx / DC Orders ED Discharge Orders     None         Cristopher Peru, PA-C 08/27/22 1444    Terrilee Files, MD 08/27/22 [phone]

## 2022-08-27 NOTE — ED Provider Notes (Signed)
Accepted handoff at shift change from [facility] PA-C. Please see prior provider note for more detail.   Briefly: Patient is 27 y.o.   Patient w/ chronic abdominal pain states that it worsened last night she states that it typically  will flareup intermittently.   DDX:  The causes of generalized abdominal pain include but are not limited to AAA, mesenteric ischemia, appendicitis, diverticulitis, DKA, gastritis, gastroenteritis, AMI, nephrolithiasis, pancreatitis, peritonitis, adrenal insufficiency,lead poisoning, iron toxicity, intestinal ischemia, constipation, UTI,SBO/LBO, splenic rupture, biliary disease, IBD, IBS, PUD, or hepatitis. Ectopic pregnancy, ovarian torsion, PID.   Plan: Reevaluate patient     Physical Exam  BP (!) 149/93   Pulse 94   Temp (!) 97.1 F (36.2 C) (Oral)   Resp 18   Ht 5\' 2"  (1.575 m)   Wt 115.7 kg   SpO2 100%   BMI 46.64 kg/m   Physical Exam Vitals and nursing note reviewed.  Constitutional:      General: She is not in acute distress. HENT:     Head: Normocephalic and atraumatic.     Nose: Nose normal.  Eyes:     General: No scleral icterus. Cardiovascular:     Rate and Rhythm: Normal rate and regular rhythm.     Pulses: Normal pulses.     Heart sounds: Normal heart sounds.  Pulmonary:     Effort: Pulmonary effort is normal. No respiratory distress.     Breath sounds: No wheezing.  Abdominal:     Palpations: Abdomen is soft.     Tenderness: There is no abdominal tenderness. There is no guarding or rebound.  Musculoskeletal:     Cervical back: Normal range of motion.     Right lower leg: No edema.     Left lower leg: No edema.  Skin:    General: Skin is warm and dry.     Capillary Refill: Capillary refill takes less than 2 seconds.  Neurological:     Mental Status: She is alert. Mental status is at baseline.  Psychiatric:        Mood and Affect: Mood normal.        Behavior: Behavior normal.     Procedures  Procedures  ED Course /  MDM   Clinical Course as of 08/27/22 1709  Mon Aug 27, 2022  1446 Counsel on no NSAIDS/doac [WF]    Clinical Course User Index [WF] Gailen Shelter, [place]   Medical Decision Making Amount and/or Complexity of Data Reviewed Labs: ordered.  Risk Prescription drug management.   Abdomen soft nontender.  No guarding or rebound.  Patient is tolerating p.o.  Labs reassuringly nml.   Patient feels improved after GI cocktail and Protonix and Zofran and is now tolerating p.o.  Is well-appearing on my exam labs unremarkable no leukocytosis or anemia.  Mild hypokalemia repleted here.  hCG negative for pregnancy.  Urinalysis without evidence of infection no vaginal discharge.  Did provide 1 dose of morphine as she states that she feels that she is back to her baseline chronic pain but is requesting some pain medicine.  Will discharge home with follow-up with OB/GYN and primary care.  Return precautions discussed     Gailen Shelter, [place] 08/27/22 1724    Charlynne Pander, MD 08/27/22 [phone]

## 2022-08-30 ENCOUNTER — Encounter (HOSPITAL_COMMUNITY): Payer: Self-pay

## 2022-08-30 ENCOUNTER — Emergency Department (HOSPITAL_COMMUNITY): Payer: Self-pay

## 2022-08-30 ENCOUNTER — Emergency Department (HOSPITAL_COMMUNITY)
Admission: EM | Admit: 2022-08-30 | Discharge: 2022-08-30 | Disposition: A | Discharge status: Home / Self Care | Payer: Self-pay | Attending: Emergency Medicine | Admitting: Emergency Medicine

## 2022-08-30 DIAGNOSIS — Z7901 Long term (current) use of anticoagulants: Secondary | ICD-10-CM | POA: Insufficient documentation

## 2022-08-30 DIAGNOSIS — R1110 Vomiting, unspecified: Secondary | ICD-10-CM | POA: Insufficient documentation

## 2022-08-30 DIAGNOSIS — R1031 Right lower quadrant pain: Secondary | ICD-10-CM | POA: Insufficient documentation

## 2022-08-30 DIAGNOSIS — R1032 Left lower quadrant pain: Secondary | ICD-10-CM | POA: Insufficient documentation

## 2022-08-30 DIAGNOSIS — Z8543 Personal history of malignant neoplasm of ovary: Secondary | ICD-10-CM | POA: Insufficient documentation

## 2022-08-30 DIAGNOSIS — R1030 Lower abdominal pain, unspecified: Secondary | ICD-10-CM

## 2022-08-30 DIAGNOSIS — Z91010 Allergy to peanuts: Secondary | ICD-10-CM | POA: Insufficient documentation

## 2022-08-30 LAB — URINALYSIS, ROUTINE W REFLEX MICROSCOPIC
Bilirubin Urine: NEGATIVE
Glucose, UA: NEGATIVE mg/dL
Hgb urine dipstick: NEGATIVE
Ketones, ur: NEGATIVE mg/dL
Leukocytes,Ua: NEGATIVE
Nitrite: NEGATIVE
Protein, ur: NEGATIVE mg/dL
Specific Gravity, Urine: 1.023 (ref 1.005–1.030)
pH: 5 (ref 5.0–8.0)

## 2022-08-30 LAB — LIPASE, BLOOD: Lipase: 31 U/L (ref 11–51)

## 2022-08-30 LAB — CBC WITH DIFFERENTIAL/PLATELET
Abs Immature Granulocytes: 0.02 10*3/uL (ref 0.00–0.07)
Basophils Absolute: 0 10*3/uL (ref 0.0–0.1)
Basophils Relative: 0 %
Eosinophils Absolute: 0.1 10*3/uL (ref 0.0–0.5)
Eosinophils Relative: 1 %
HCT: 41.6 % (ref 36.0–46.0)
Hemoglobin: 13.3 g/dL (ref 12.0–15.0)
Immature Granulocytes: 0 %
Lymphocytes Relative: 29 %
Lymphs Abs: 2.5 10*3/uL (ref 0.7–4.0)
MCH: 23.6 pg — ABNORMAL LOW (ref 26.0–34.0)
MCHC: 32 g/dL (ref 30.0–36.0)
MCV: 73.9 fL — ABNORMAL LOW (ref 80.0–100.0)
Monocytes Absolute: 0.4 10*3/uL (ref 0.1–1.0)
Monocytes Relative: 4 %
Neutro Abs: 5.5 10*3/uL (ref 1.7–7.7)
Neutrophils Relative %: 66 %
Platelets: 474 10*3/uL — ABNORMAL HIGH (ref 150–400)
RBC: 5.63 MIL/uL — ABNORMAL HIGH (ref 3.87–5.11)
RDW: 16.9 % — ABNORMAL HIGH (ref 11.5–15.5)
WBC: 8.5 10*3/uL (ref 4.0–10.5)
nRBC: 0 % (ref 0.0–0.2)

## 2022-08-30 LAB — COMPREHENSIVE METABOLIC PANEL
ALT: 12 U/L (ref 0–44)
AST: 14 U/L — ABNORMAL LOW (ref 15–41)
Albumin: 4.4 g/dL (ref 3.5–5.0)
Alkaline Phosphatase: 80 U/L (ref 38–126)
Anion gap: 9 (ref 5–15)
BUN: 6 mg/dL (ref 6–20)
CO2: 20 mmol/L — ABNORMAL LOW (ref 22–32)
Calcium: 9.1 mg/dL (ref 8.9–10.3)
Chloride: 109 mmol/L (ref 98–111)
Creatinine, Ser: 0.75 mg/dL (ref 0.44–1.00)
GFR, Estimated: >60 mL/min (ref >60)
Glucose, Bld: 93 mg/dL (ref 70–99)
Potassium: 3.5 mmol/L (ref 3.5–5.1)
Sodium: 138 mmol/L (ref 135–145)
Total Bilirubin: 0.2 mg/dL — ABNORMAL LOW (ref 0.3–1.2)
Total Protein: 8.3 g/dL — ABNORMAL HIGH (ref 6.5–8.1)

## 2022-08-30 LAB — HCG, SERUM, QUALITATIVE: Preg, Serum: NEGATIVE

## 2022-08-30 MED ORDER — MORPHINE SULFATE (PF) 4 MG/ML IV SOLN
4.0000 mg | Freq: Once | INTRAVENOUS | Status: AC
  Administered 2022-08-30: 4 mg via INTRAVENOUS
  Filled 2022-08-30: qty 1

## 2022-08-30 MED ORDER — DIPHENHYDRAMINE HCL 50 MG/ML IJ SOLN: 50.0000 mg | Freq: Once | INTRAMUSCULAR | Status: DC

## 2022-08-30 MED ORDER — DIAZEPAM 5 MG PO TABS
5.0000 mg | ORAL_TABLET | Freq: Once | ORAL | Status: AC
  Administered 2022-08-30: 5 mg via ORAL
  Filled 2022-08-30: qty 1

## 2022-08-30 MED ORDER — DIPHENHYDRAMINE HCL 50 MG/ML IJ SOLN
25.0000 mg | Freq: Once | INTRAMUSCULAR | Status: AC
  Administered 2022-08-30: 25 mg via INTRAVENOUS
  Filled 2022-08-30: qty 1

## 2022-08-30 MED ORDER — ONDANSETRON HCL 4 MG/2ML IJ SOLN
4.0000 mg | Freq: Once | INTRAMUSCULAR | Status: AC
  Administered 2022-08-30: 4 mg via INTRAVENOUS
  Filled 2022-08-30: qty 2

## 2022-08-30 MED ORDER — OXYCODONE HCL 5 MG PO TABS
10.0000 mg | ORAL_TABLET | Freq: Once | ORAL | Status: AC
  Administered 2022-08-30: 10 mg via ORAL
  Filled 2022-08-30: qty 2

## 2022-08-30 NOTE — ED Triage Notes (Signed)
Pt presents with c/o abdominal pain that started several days ago. Pt has an extensive hx with endometriosis, cancer, and has been through chemo/radiation for multiple tumors. Pt reports she began to have pain several days ago, generalized in nature. Pt was seen here but reports no scans were done at that time. She followed up with her OBGYN and was referred back to the ER for follow-up and scans.

## 2022-08-30 NOTE — Discharge Instructions (Signed)
Please follow up with your care team for further management. Recommend establishing care locally.

## 2022-08-30 NOTE — ED Provider Notes (Signed)
[place] EMERGENCY DEPARTMENT AT [facility] Provider Note   CSN: [id number] Arrival date & time: 08/30/22  1201     History  Chief Complaint  Patient presents with   Abdominal Pain    [name] is a 27 y.o. female.  27 y.o. 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 Has had illius in the past. Vomiting x 4 episodes today, has seen blood in emesis, taking zofran with some improvement. Taking NSAIDs, on Eliquis and is aware she is not supposed to take NSAIDs but Tylenol is not helping pain. Seen here 08/27/22 for same, did not have CT at that time. Followed up via phone call to her care team in [place], [place] who advised her to return to the ER for imaging based on history.  Also lupus.  No hx HTN        Home Medications Prior to Admission medications   Medication Sig Start Date End Date Taking? Authorizing Provider  ALPRAZolam Prudy Feeler) 1 MG tablet Take 1.5 mg by mouth 2 (two) times daily.    [provider]  apixaban (ELIQUIS) 5 MG TABS tablet Take 1 tablet (5 mg total) by mouth 2 (two) times daily. Patient taking differently: Take 5 mg by mouth at bedtime. 08/25/21   Lonia Blood, MD  Baclofen 5 MG TABS Take 5 mg by mouth 2 (two) times daily as needed. Patient not taking: Reported on 08/27/2022 12/28/21   Lorriane Shire, MD  Goserelin Acetate (ZOLADEX [place]) Inject 1 Device into the skin every 3 (three) months.    [provider]  hydroxychloroquine (PLAQUENIL) 200 MG tablet Take 400 mg by mouth at bedtime.    [provider]  naproxen (EC NAPROSYN) 500 MG EC tablet Take 500-1,000 mg by mouth 4 (four) times daily as needed (for pain).    [provider]  norethindrone (AYGESTIN) 5 MG tablet Take 1 tablet (5 mg total) by mouth daily. Patient taking differently: Take 10 mg by  mouth at bedtime. 12/14/21   Henderly, Britni A, PA-C  ondansetron (ZOFRAN-ODT) 4 MG disintegrating tablet Take 1 tablet (4 mg total) by mouth every 8 (eight) hours as needed for nausea or vomiting. 08/27/22   Gailen Shelter, PA  oxyCODONE-acetaminophen (PERCOCET/ROXICET) 5-325 MG tablet Take 1 tablet by mouth every 6 (six) hours as needed for severe pain. Patient not taking: Reported on 08/27/2022 12/25/21   Benjiman Core, MD  pantoprazole (PROTONIX) 20 MG tablet Take 1 tablet (20 mg total) by mouth daily. 08/27/22   Gailen Shelter, PA  promethazine (PHENERGAN) 25 MG suppository Place 1 suppository (25 mg total) rectally every 6 (six) hours as needed for nausea or vomiting. Patient not taking: Reported on 08/27/2022 01/18/22   Benjiman Core, MD  promethazine (PHENERGAN) 25 MG tablet Take 1 tablet (25 mg total) by mouth every 6 (six) hours as needed for nausea or vomiting. Patient not taking: Reported on 08/27/2022 12/14/21   Henderly, Britni A, PA-C  zolpidem (AMBIEN) 10 MG tablet Take 20 mg by mouth at bedtime.    [provider]  dicyclomine (BENTYL) 20 MG tablet Take 1 tablet (20 mg total) by mouth 2 (two) times daily. 11/29/18 04/11/19  Muthersbaugh, Dahlia Client, PA-C  Fluticasone Propionate HFA (FLOVENT HFA IN) Inhale into the lungs. Patient not taking: Reported on 11/14/2019  11/14/19  [provider]      Allergies    Amoxicillin, Azithromycin, Fish-derived products, Peanut-containing drug products, Gluten meal, Iodinated contrast media, Other, Iodine, and Lactose intolerance (gi)    Review of Systems   Review of Systems Negative except as per HPI Physical Exam Updated Vital Signs BP 114/81 (BP Location: Right Arm)   Pulse (!) 101   Temp 98.4 F (36.9 C) (Oral)   Resp 18   SpO2 98%  Physical Exam Vitals and nursing note reviewed.  Constitutional:      General: She is not in acute distress.    Appearance: She is well-developed. She is not diaphoretic.  HENT:      Head: Normocephalic and atraumatic.  Pulmonary:     Effort: Pulmonary effort is normal.  Abdominal:     General: Bowel sounds are normal.     Tenderness: There is abdominal tenderness in the right lower quadrant and left lower quadrant. There is no guarding or rebound.     Comments: Mild tenderness right and left lower abdomen with deep palpation, no guarding or rebound  Skin:    General: Skin is warm and dry.     Findings: No erythema or rash.  Neurological:     Mental Status: She is alert and oriented to person, place, and time.  Psychiatric:        Behavior: Behavior normal.     ED Results / Procedures / Treatments   Labs (all labs ordered are listed, but only abnormal results are displayed) Labs Reviewed  CBC WITH DIFFERENTIAL/PLATELET - Abnormal; Notable for the following components:      Result Value   RBC 5.63 (*)    MCV 73.9 (*)    MCH 23.6 (*)    RDW 16.9 (*)    Platelets 474 (*)    All other components within normal limits  COMPREHENSIVE METABOLIC PANEL - Abnormal; Notable for the following components:   CO2 20 (*)    Total Protein 8.3 (*)    AST 14 (*)    Total Bilirubin 0.2 (*)    All other components within normal limits  LIPASE, BLOOD  URINALYSIS, ROUTINE W REFLEX MICROSCOPIC  HCG, SERUM, QUALITATIVE    EKG None  Radiology US PELVIS (TRANSABDOMINAL ONLY)  Result Date: 08/30/2022 CLINICAL DATA:  Pelvic pain for 1 day.  Unknown LMP. EXAM: TRANSABDOMINAL ULTRASOUND OF PELVIS TECHNIQUE: Transabdominal ultrasound examination of the pelvis was performed including evaluation of the uterus, ovaries, adnexal regions, and pelvic cul-de-sac. COMPARISON:  02/12/2022 CT, 01/20/2022 ultrasound FINDINGS: Uterus Measurements: 7.2 x 2.1 x 4.1 centimeters = volume: 33.3 mL. No fibroids or other mass visualized. Endometrium Thickness: Not visualized. Right ovary Measurements: Not visualized.  No adnexal mass identified. Left ovary Measurements: Not visualized.  No adnexal mass  identified. Other findings: Study quality is degraded by patient body habitus and overlying bowel gas. Patient declined transvaginal portion of the exam. IMPRESSION: 1. Study is limited by patient body habitus and overlying bowel gas. 2. No uterine mass is identified. The endometrium was not visualized. The ovaries are not visualized. Electronically Signed   By: Norva Pavlov M.D.   On: 08/30/2022 16:23    Procedures Procedures    Medications Ordered in ED Medications  diazepam (VALIUM) tablet 5 mg (5 mg Oral Given 08/30/22 1542)  oxyCODONE (Oxy IR/ROXICODONE) immediate release tablet 10 mg (10 mg Oral Given 08/30/22 1542)  ondansetron (ZOFRAN) injection 4 mg (4 mg Intravenous Given 08/30/22 1652)  morphine (PF) 4  MG/ML injection 4 mg (4 mg Intravenous Given 08/30/22 1652)  diphenhydrAMINE (BENADRYL) injection 25 mg (25 mg Intravenous Given 08/30/22 1652)    ED Course/ Medical Decision Making/ A&P                                 Medical Decision Making Amount and/or Complexity of Data Reviewed Labs: ordered. Radiology: ordered.  Risk Prescription drug management.   This patient presents to the ED for concern of pelvic pain and vomiting, this involves an extensive number of treatment options, and is a complaint that carries with it a high risk of complications and morbidity.  The differential diagnosis includes but not limited to ovarian cyst, obstruction, gastritis    Co morbidities that complicate the patient evaluation  Endometriosis, lupus, asthma, celiac disease, PE, ovarian cyst, panic disorder, cluster B personality disorder Appendectomy and cholecystectomy    Additional history obtained:  External records from outside source obtained and reviewed including extensive review of records in our hospital system as well as care everywhere including ER visits, hospitalizations, outpatient visits, labs and imaging.  Most recent CT obtained 07/28/22 at [facility]- non contrast  study with normal pelvis/uterus/bladder. Multiple Cts and [place] as well as MRI a/p obtained April 12 2022 through 07/18/22. 02/28/22 laparoscopy for lysis of adhesions, resection of endometriosis 04/02/22 seen by GYN for endometriosis who recommended MRI and referral to Dr. Cornell Barman for endometriosis. MRI 04/12/22 abdomen and pelvis with contrast, normal. Admitted to the hospital 04/20/22, seen by [facility] while in the hospital who reviewed imaging, felt not likely endometriosis, recommendations for from Dr. Clarisa Schools reviewed.     Lab Tests:  I Ordered, and personally interpreted labs.  The pertinent results include:  CBC, CMP, lipase, UA, hcg. Negative or without significant changes compared to prior labs on file    Imaging Studies ordered:  I ordered imaging studies including pelvic US  I independently visualized and interpreted imaging which showed no adnexal mass. Unable to tolerate transvaginal due to declining study secondary to trauma. Attempted to premedicate with valium however patient reports she vomited the valium (and oxy). I agree with the radiologist interpretation   Problem List / ED Course / Critical interventions / Medication management  27 year old female returns to the ER with ongoing lower abdominal pain and vomiting as above. Patient's concern is that she had a tumor removed from her ovary in February and is having lower abdominal pain and did not receive any imaging at her last ER visit a few days ago. I have reviewed the note from her last ER visit as well as extensive chart review as noted above. Patient has had multiple CT scans this year that do not show any concerning abdominal or pelvic pathology (other than the CT in January that did show a large ovarian cyst). Patient's labs are stable, her vitals are improved. I do not feel at this time she would benefit from further CT imaging and further radiation may result in harm to this patient. Her [place] today is limited but does not show  any adnexal masses. Patient is concerned about her severe pain from her severe endometriosis and her ability to complete her program through [facility]. Recommend patient establish care locally. She prefers to return home to her team in [place] later this month for recheck, reports she is supposed to have a significant/complex abdominal surgery to remove all of the areas of endometriosis. I  ordered medication including valium and oxy for pain and anxiety, reports she vomited these medicaitons during her transabdominal surgery. Ordered IV morphine and zofran, as well as benadryl for itching for pain and vomiting.  Reevaluation of the patient after these medicines showed that the patient improved I have reviewed the patients home medicines and have made adjustments as needed   Social Determinants of Health:  Primary care team in [place]. Has seen [facility] locally but notes no current care team in [place]. Student, attending [employer] remotely this year.    Test / Admission - Considered:  Stable for dc with plan to follow up with her care team in [place] later this month. Recommend establishing care locally.          Final Clinical Impression(s) / ED Diagnoses Final diagnoses:  Lower abdominal pain    Rx / DC Orders ED Discharge Orders     None         Jeannie Fend, PA-C 08/30/22 1702    Laurence Spates, MD 08/31/22 [phone]

## 2022-08-30 NOTE — ED Notes (Signed)
Blood obtained, and sent. Unable to establish IV. Attempted x3 with [place], unsuccessfully. Tolerated well.

## 2022-08-30 NOTE — ED Notes (Signed)
Pt ambulatory to waiting room. Pt verbalized understanding of discharge instructions.   

## 2022-08-30 NOTE — ED Provider Triage Note (Signed)
Emergency Medicine Provider Triage Evaluation Note  [name] , a 27 y.o. female  was evaluated in triage.  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 Has had illius in the past. Vomiting x 4 episodes today, has seen blood in emesis, taking zofran with some improvement. Taking NSAIDs, on Eliquis and is aware she is not supposed to take NSAIDs but Tylenol is not helping pain. Also lupus.  No hx HTN Review of Systems  Positive: Abd pain, vomiting Negative: Fever, changes in bowel or bladder habits, vaginal dc  Physical Exam  BP (!) 166/112 (BP Location: Right Arm)   Pulse (!) 119   Temp 98.3 F (36.8 C) (Oral)   Resp 16   SpO2 98%  Gen:   Awake, no distress   Resp:  Normal effort  MSK:   Moves extremities without difficulty  Other:    Medical Decision Making  Medically screening exam initiated at 12:39 PM.  Appropriate orders placed.  Katyra Gennette Barsky was informed that the remainder of the evaluation will be completed by another provider, this initial triage assessment does not replace that evaluation, and the importance of remaining in the ED until their evaluation is complete.     Jeannie Fend, PA-C 08/30/22 1241

## 2022-09-23 ENCOUNTER — Emergency Department (HOSPITAL_COMMUNITY): Payer: PRIVATE HEALTH INSURANCE

## 2022-09-23 ENCOUNTER — Other Ambulatory Visit: Payer: Self-pay

## 2022-09-23 ENCOUNTER — Inpatient Hospital Stay (HOSPITAL_COMMUNITY)
Admission: EM | Admit: 2022-09-23 | Discharge: 2022-09-25 | DRG: 378 | Disposition: A | Discharge status: Home / Self Care | Payer: PRIVATE HEALTH INSURANCE | Attending: Internal Medicine | Admitting: Internal Medicine

## 2022-09-23 ENCOUNTER — Encounter (HOSPITAL_COMMUNITY): Payer: Self-pay

## 2022-09-23 DIAGNOSIS — Z91010 Allergy to peanuts: Secondary | ICD-10-CM

## 2022-09-23 DIAGNOSIS — Z8261 Family history of arthritis: Secondary | ICD-10-CM

## 2022-09-23 DIAGNOSIS — F32A Depression, unspecified: Secondary | ICD-10-CM | POA: Diagnosis present

## 2022-09-23 DIAGNOSIS — Z91018 Allergy to other foods: Secondary | ICD-10-CM

## 2022-09-23 DIAGNOSIS — D649 Anemia, unspecified: Secondary | ICD-10-CM | POA: Diagnosis present

## 2022-09-23 DIAGNOSIS — G4700 Insomnia, unspecified: Secondary | ICD-10-CM | POA: Diagnosis present

## 2022-09-23 DIAGNOSIS — R112 Nausea with vomiting, unspecified: Secondary | ICD-10-CM

## 2022-09-23 DIAGNOSIS — Z83438 Family history of other disorder of lipoprotein metabolism and other lipidemia: Secondary | ICD-10-CM

## 2022-09-23 DIAGNOSIS — Z91041 Radiographic dye allergy status: Secondary | ICD-10-CM

## 2022-09-23 DIAGNOSIS — E872 Acidosis, unspecified: Secondary | ICD-10-CM | POA: Insufficient documentation

## 2022-09-23 DIAGNOSIS — N809 Endometriosis, unspecified: Secondary | ICD-10-CM | POA: Diagnosis present

## 2022-09-23 DIAGNOSIS — K2950 Unspecified chronic gastritis without bleeding: Secondary | ICD-10-CM | POA: Diagnosis present

## 2022-09-23 DIAGNOSIS — R739 Hyperglycemia, unspecified: Secondary | ICD-10-CM | POA: Diagnosis present

## 2022-09-23 DIAGNOSIS — Z7901 Long term (current) use of anticoagulants: Secondary | ICD-10-CM

## 2022-09-23 DIAGNOSIS — M329 Systemic lupus erythematosus, unspecified: Secondary | ICD-10-CM | POA: Diagnosis present

## 2022-09-23 DIAGNOSIS — R1013 Epigastric pain: Secondary | ICD-10-CM

## 2022-09-23 DIAGNOSIS — K900 Celiac disease: Secondary | ICD-10-CM | POA: Diagnosis present

## 2022-09-23 DIAGNOSIS — R000 Tachycardia, unspecified: Secondary | ICD-10-CM | POA: Diagnosis present

## 2022-09-23 DIAGNOSIS — F329 Major depressive disorder, single episode, unspecified: Secondary | ICD-10-CM

## 2022-09-23 DIAGNOSIS — Z79899 Other long term (current) drug therapy: Secondary | ICD-10-CM

## 2022-09-23 DIAGNOSIS — Z809 Family history of malignant neoplasm, unspecified: Secondary | ICD-10-CM

## 2022-09-23 DIAGNOSIS — Z8041 Family history of malignant neoplasm of ovary: Secondary | ICD-10-CM

## 2022-09-23 DIAGNOSIS — Z880 Allergy status to penicillin: Secondary | ICD-10-CM

## 2022-09-23 DIAGNOSIS — R109 Unspecified abdominal pain: Secondary | ICD-10-CM | POA: Diagnosis present

## 2022-09-23 DIAGNOSIS — N939 Abnormal uterine and vaginal bleeding, unspecified: Secondary | ICD-10-CM | POA: Diagnosis present

## 2022-09-23 DIAGNOSIS — Z86711 Personal history of pulmonary embolism: Secondary | ICD-10-CM

## 2022-09-23 DIAGNOSIS — K920 Hematemesis: Principal | ICD-10-CM | POA: Diagnosis present

## 2022-09-23 DIAGNOSIS — Z8489 Family history of other specified conditions: Secondary | ICD-10-CM

## 2022-09-23 DIAGNOSIS — Z8719 Personal history of other diseases of the digestive system: Secondary | ICD-10-CM

## 2022-09-23 DIAGNOSIS — Z888 Allergy status to other drugs, medicaments and biological substances status: Secondary | ICD-10-CM

## 2022-09-23 DIAGNOSIS — E538 Deficiency of other specified B group vitamins: Secondary | ICD-10-CM | POA: Diagnosis present

## 2022-09-23 DIAGNOSIS — K226 Gastro-esophageal laceration-hemorrhage syndrome: Secondary | ICD-10-CM

## 2022-09-23 DIAGNOSIS — Z881 Allergy status to other antibiotic agents status: Secondary | ICD-10-CM

## 2022-09-23 DIAGNOSIS — Z8249 Family history of ischemic heart disease and other diseases of the circulatory system: Secondary | ICD-10-CM

## 2022-09-23 DIAGNOSIS — Z803 Family history of malignant neoplasm of breast: Secondary | ICD-10-CM

## 2022-09-23 DIAGNOSIS — J45909 Unspecified asthma, uncomplicated: Secondary | ICD-10-CM | POA: Diagnosis present

## 2022-09-23 LAB — FOLATE: Folate: 2.4 ng/mL — ABNORMAL LOW (ref >5.9)

## 2022-09-23 LAB — CBC
HCT: 37 % (ref 36.0–46.0)
HCT: 38.4 % (ref 36.0–46.0)
Hemoglobin: 11.9 g/dL — ABNORMAL LOW (ref 12.0–15.0)
Hemoglobin: 12.1 g/dL (ref 12.0–15.0)
MCH: 23.4 pg — ABNORMAL LOW (ref 26.0–34.0)
MCH: 23.5 pg — ABNORMAL LOW (ref 26.0–34.0)
MCHC: 32.2 g/dL (ref 30.0–36.0)
MCHC: 31.5 g/dL (ref 30.0–36.0)
MCV: 72.8 fL — ABNORMAL LOW (ref 80.0–100.0)
MCV: 74.7 fL — ABNORMAL LOW (ref 80.0–100.0)
Platelets: 378 10*3/uL (ref 150–400)
Platelets: 387 10*3/uL (ref 150–400)
RBC: 5.08 MIL/uL (ref 3.87–5.11)
RBC: 5.14 MIL/uL — ABNORMAL HIGH (ref 3.87–5.11)
RDW: 16.3 % — ABNORMAL HIGH (ref 11.5–15.5)
RDW: 16.4 % — ABNORMAL HIGH (ref 11.5–15.5)
WBC: 7.3 10*3/uL (ref 4.0–10.5)
WBC: 8.6 10*3/uL (ref 4.0–10.5)
nRBC: 0 % (ref 0.0–0.2)
nRBC: 0 % (ref 0.0–0.2)

## 2022-09-23 LAB — HCG, SERUM, QUALITATIVE: Preg, Serum: NEGATIVE

## 2022-09-23 LAB — URINALYSIS, ROUTINE W REFLEX MICROSCOPIC
Bilirubin Urine: NEGATIVE
Glucose, UA: NEGATIVE mg/dL
Hgb urine dipstick: NEGATIVE
Ketones, ur: NEGATIVE mg/dL
Leukocytes,Ua: NEGATIVE
Nitrite: NEGATIVE
Protein, ur: NEGATIVE mg/dL
Specific Gravity, Urine: 1.016 (ref 1.005–1.030)
pH: 5 (ref 5.0–8.0)

## 2022-09-23 LAB — COMPREHENSIVE METABOLIC PANEL
ALT: 13 U/L (ref 0–44)
AST: 15 U/L (ref 15–41)
Albumin: 3.7 g/dL (ref 3.5–5.0)
Alkaline Phosphatase: 73 U/L (ref 38–126)
Anion gap: 9 (ref 5–15)
BUN: 7 mg/dL (ref 6–20)
CO2: 20 mmol/L — ABNORMAL LOW (ref 22–32)
Calcium: 9 mg/dL (ref 8.9–10.3)
Chloride: 109 mmol/L (ref 98–111)
Creatinine, Ser: 0.85 mg/dL (ref 0.44–1.00)
GFR, Estimated: >60 mL/min (ref >60)
Glucose, Bld: 120 mg/dL — ABNORMAL HIGH (ref 70–99)
Potassium: 3.5 mmol/L (ref 3.5–5.1)
Sodium: 138 mmol/L (ref 135–145)
Total Bilirubin: 0.4 mg/dL (ref 0.3–1.2)
Total Protein: 6.9 g/dL (ref 6.5–8.1)

## 2022-09-23 LAB — TYPE AND SCREEN
ABO/RH(D): AB POS
Antibody Screen: NEGATIVE

## 2022-09-23 LAB — TROPONIN I (HIGH SENSITIVITY)
Troponin I (High Sensitivity): 3 ng/L (ref <18)
Troponin I (High Sensitivity): 5 ng/L (ref <18)

## 2022-09-23 LAB — RETICULOCYTES
Immature Retic Fract: 13.7 % (ref 2.3–15.9)
RBC.: 5.08 MIL/uL (ref 3.87–5.11)
Retic Count, Absolute: 62.5 10*3/uL (ref 19.0–186.0)
Retic Ct Pct: 1.2 % (ref 0.4–3.1)

## 2022-09-23 LAB — LIPASE, BLOOD: Lipase: 34 U/L (ref 11–51)

## 2022-09-23 MED ORDER — MORPHINE SULFATE (PF) 2 MG/ML IV SOLN
1.0000 mg | INTRAVENOUS | Status: DC | PRN
  Administered 2022-09-24 (×4): 1 mg via INTRAVENOUS
  Filled 2022-09-23 (×5): qty 1

## 2022-09-23 MED ORDER — ACETAMINOPHEN 650 MG RE SUPP: 650.0000 mg | Freq: Four times a day (QID) | RECTAL | Status: DC | PRN

## 2022-09-23 MED ORDER — SODIUM CHLORIDE 0.9 % IV BOLUS
1000.0000 mL | Freq: Once | INTRAVENOUS | Status: AC
  Administered 2022-09-23: 1000 mL via INTRAVENOUS

## 2022-09-23 MED ORDER — HYDROMORPHONE HCL 1 MG/ML IJ SOLN
0.5000 mg | Freq: Once | INTRAMUSCULAR | Status: AC
  Administered 2022-09-23: 0.5 mg via INTRAVENOUS
  Filled 2022-09-23: qty 1

## 2022-09-23 MED ORDER — METHYLPREDNISOLONE SODIUM SUCC 40 MG IJ SOLR
40.0000 mg | Freq: Once | INTRAMUSCULAR | Status: AC
  Administered 2022-09-23: 40 mg via INTRAVENOUS
  Filled 2022-09-23: qty 1

## 2022-09-23 MED ORDER — ACETAMINOPHEN 325 MG PO TABS: 650.0000 mg | ORAL_TABLET | Freq: Four times a day (QID) | ORAL | Status: DC | PRN

## 2022-09-23 MED ORDER — SODIUM CHLORIDE 0.9 % IV SOLN: 250.0000 mL | INTRAVENOUS | Status: DC | PRN

## 2022-09-23 MED ORDER — DIPHENHYDRAMINE HCL 50 MG/ML IJ SOLN
50.0000 mg | Freq: Once | INTRAMUSCULAR | Status: AC
  Administered 2022-09-23: 50 mg via INTRAVENOUS
  Filled 2022-09-23 (×2): qty 1

## 2022-09-23 MED ORDER — ONDANSETRON HCL 4 MG/2ML IJ SOLN
4.0000 mg | Freq: Four times a day (QID) | INTRAMUSCULAR | Status: DC | PRN
  Administered 2022-09-23 – 2022-09-25 (×5): 4 mg via INTRAVENOUS
  Filled 2022-09-23 (×6): qty 2

## 2022-09-23 MED ORDER — SODIUM CHLORIDE 0.9% FLUSH
3.0000 mL | Freq: Two times a day (BID) | INTRAVENOUS | Status: DC
  Administered 2022-09-24: 3 mL via INTRAVENOUS

## 2022-09-23 MED ORDER — DIPHENHYDRAMINE HCL 50 MG/ML IJ SOLN
50.0000 mg | Freq: Once | INTRAMUSCULAR | Status: AC
  Administered 2022-09-23: 50 mg via INTRAVENOUS
  Filled 2022-09-23: qty 1

## 2022-09-23 MED ORDER — ONDANSETRON HCL 4 MG/2ML IJ SOLN
4.0000 mg | Freq: Once | INTRAMUSCULAR | Status: AC
  Administered 2022-09-23: 4 mg via INTRAVENOUS
  Filled 2022-09-23: qty 2

## 2022-09-23 MED ORDER — DEXTROSE IN LACTATED RINGERS 5 % IV SOLN: INTRAVENOUS | Status: AC

## 2022-09-23 MED ORDER — SODIUM CHLORIDE 0.9% FLUSH: 3.0000 mL | INTRAVENOUS | Status: DC | PRN

## 2022-09-23 MED ORDER — HYDROMORPHONE HCL 1 MG/ML IJ SOLN
1.0000 mg | Freq: Once | INTRAMUSCULAR | Status: AC
  Administered 2022-09-23: 1 mg via INTRAVENOUS
  Filled 2022-09-23: qty 1

## 2022-09-23 MED ORDER — ALPRAZOLAM 0.5 MG PO TABS
1.5000 mg | ORAL_TABLET | Freq: Two times a day (BID) | ORAL | Status: DC | PRN
  Administered 2022-09-23 – 2022-09-24 (×2): 1.5 mg via ORAL
  Filled 2022-09-23: qty 3
  Filled 2022-09-23: qty 6
  Filled 2022-09-23: qty 3

## 2022-09-23 MED ORDER — IOHEXOL 350 MG/ML SOLN
100.0000 mL | Freq: Once | INTRAVENOUS | Status: AC | PRN
  Administered 2022-09-23: 100 mL via INTRAVENOUS

## 2022-09-23 MED ORDER — SODIUM CHLORIDE 0.9 % IV SOLN
25.0000 mg | Freq: Four times a day (QID) | INTRAVENOUS | Status: DC | PRN
  Administered 2022-09-24: 25 mg via INTRAVENOUS
  Filled 2022-09-23: qty 1

## 2022-09-23 MED ORDER — DIPHENHYDRAMINE HCL 25 MG PO CAPS: 50.0000 mg | ORAL_CAPSULE | Freq: Once | ORAL | Status: AC

## 2022-09-23 MED ORDER — PANTOPRAZOLE 80MG IVPB - SIMPLE MED
80.0000 mg | Freq: Two times a day (BID) | INTRAVENOUS | Status: DC
  Administered 2022-09-24 (×2): 80 mg via INTRAVENOUS
  Filled 2022-09-23 (×3): qty 100

## 2022-09-23 MED ORDER — ALPRAZOLAM 0.25 MG PO TABS: 1.5000 mg | ORAL_TABLET | Freq: Two times a day (BID) | ORAL | Status: DC

## 2022-09-23 MED ORDER — SODIUM CHLORIDE 0.9% FLUSH
3.0000 mL | Freq: Two times a day (BID) | INTRAVENOUS | Status: DC
  Administered 2022-09-24 – 2022-09-25 (×2): 3 mL via INTRAVENOUS

## 2022-09-23 MED ORDER — DIPHENHYDRAMINE HCL 50 MG/ML IJ SOLN
12.5000 mg | Freq: Four times a day (QID) | INTRAMUSCULAR | Status: DC | PRN
  Administered 2022-09-23 – 2022-09-24 (×2): 12.5 mg via INTRAVENOUS
  Filled 2022-09-23 (×3): qty 1

## 2022-09-23 MED ORDER — HYDROMORPHONE HCL 1 MG/ML IJ SOLN
1.0000 mg | INTRAMUSCULAR | Status: DC | PRN
  Administered 2022-09-23 – 2022-09-24 (×4): 1 mg via INTRAVENOUS
  Filled 2022-09-23 (×4): qty 1

## 2022-09-23 MED ORDER — PANTOPRAZOLE SODIUM 40 MG IV SOLR
40.0000 mg | Freq: Once | INTRAVENOUS | Status: AC
  Administered 2022-09-23: 40 mg via INTRAVENOUS
  Filled 2022-09-23: qty 10

## 2022-09-23 MED ORDER — SODIUM CHLORIDE 0.9 % IV SOLN
25.0000 mg | Freq: Once | INTRAVENOUS | Status: AC
  Administered 2022-09-23: 25 mg via INTRAVENOUS
  Filled 2022-09-23: qty 1

## 2022-09-23 NOTE — H&P (Addendum)
History and Physical    [name] ZOX:[id number] DOB: 09/03/1995 DOA: 09/23/2022  PCP: Patient, No Pcp Per   Patient coming from: Home   Chief Complaint:  Chief Complaint  Patient presents with   Hematemesis    HPI:  [name] is a 27 y.o. female with medical history significant of lupus, celiac disease, endometriosis, PE, chronic gastritis, major depressive disorder, and ovarian tumor presented to emergency department complaining of abdominal pain and vomiting of blood.  Patient is also complaining about chest pain and shortness of breath patient reported to started vomiting last night.  She has been taking large amount of NSAID daily due to her endometriosis pain. Patient reported she has been not taking Eliquis as she ran out of her Eliquis approximate 1 month ago. Patient reported history of EGD while in [place] which showed some gastritis. Patient reported in the past she has 2 episodes of the PE and last episode of pain 2023.  She used to be on Eliquis on and off.  However she ran out of Eliquis 1 month ago.  She also used to see an rheumatology in [place] however she lost follow-up with rheumatology and currently not taking Plaquenil for last few months.  Patient will need to establish  care with a primary care doctor, rheumatology and hematology over here in [place].   ED Course:  At initial presentation to ED patient found tachycardic 116, blood pressure 154/89, respiratory 97% room air.  Temperature 98.4.  CMP unremarkable except slightly low bicarb 20, elevated blood glucose 120. CBC showed normal WBC 7.3, RBC 5, hemoglobin 11.9, hematocrit 37 and platelet 378. Pregnancy test negative. Troponin level 3 and 5 within normal range. EKG showing sinus tachycardia and borderline T wave abnormality.  Heart rate 102. Lipase 34 within normal range.  CT abdomen pelvis showed no acute thoracic and abdominal aorta abnormality, no acute intraabdominal and intrapelvic  abnormality.  Status post cholecystectomy and appendectomy.  EDP physician reported patient has 1 episode of coffee-ground emesis and then some frank vomiting of blood while in the ED.  For last few hours she does not have any repeat vomiting of blood yet.  However continues to complaining about midepigastric pain.  In the ED patient has been treated with Benadryl, multiple doses of Dilaudid, methylprednisone Protonix, Phenergan.  EDP provider discussed case with [place] gastroenterology Dr. Marina Goodell who will see patient soon and will assess for need for EGD.  Review of Systems:  Review of Systems  Constitutional:  Negative for chills, fever, malaise/fatigue and weight loss.  Respiratory:  Negative for cough, sputum production and shortness of breath.   Cardiovascular:  Negative for chest pain, palpitations, orthopnea, claudication and leg swelling.  Gastrointestinal:  Positive for abdominal pain, blood in stool, nausea and vomiting. Negative for constipation, diarrhea and heartburn.  Genitourinary:  Negative for dysuria and urgency.  Musculoskeletal:  Negative for myalgias.  Neurological:  Negative for dizziness and headaches.  Psychiatric/Behavioral:  The patient is nervous/anxious.     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   Collagen vascular disease (HCC)    Diarrhea    Patient mentions diagnosis of ulcerative colitis but it is not clear she actually has UC   Endometriosis    Lupus (HCC)    Ovarian cyst    Panic disorder 05/30/2020   Pulmonary embolus (HCC) 02/06/2020   Seizures (HCC)  Past Surgical History:  Procedure Laterality Date   APPENDECTOMY     CHOLECYSTECTOMY  2015   ESOPHAGOGASTRODUODENOSCOPY (EGD) WITH PROPOFOL N/A 06/18/2020   Procedure: ESOPHAGOGASTRODUODENOSCOPY (EGD) WITH PROPOFOL;  Surgeon: Sherrilyn Rist, MD;  Location: [facility];  Service: Gastroenterology;  Laterality:  N/A;   EXCISION OF ENDOMETRIOMA     Ovarian cyst removal   excision of endometriosis     FOOT FRACTURE SURGERY     w/ hardware   HERNIA REPAIR     TONSILLECTOMY       reports that she has never smoked. She has never used smokeless tobacco. She reports that she does not drink alcohol and does not use drugs.  Allergies  Allergen Reactions   Amoxicillin Anaphylaxis, Swelling and Other (See Comments)    Hand swelling (PENFAST 4+) Tolerated ceftriaxone 2021 PenFast Score 4+:  High risk of positive penicillin allergy (50%) Oral challenge NOT recommended  Cephalosporins may be considered under close monitoring   Azithromycin Anaphylaxis and Other (See Comments)   Fish-Derived Products Anaphylaxis and Other (See Comments)    Eats shellfish alright   Peanut-Containing Drug Products Hives, Itching, Rash and Other (See Comments)         Gluten Meal Itching, Rash and Other (See Comments)    Celiac disease   Iodinated Contrast Media Hives and Itching    02/04/2020 Patient stated has history of itching and hives with CT IV Iodine contrast, premedicated with Benadryl 25 mg IV prior to CT IV Iodine scan today.Post CT scan patient reported itching, Benadryl 25 mg IV given, symptoms resolved, NP recommends for patient to get premedicated with Benadryl 50 mg IV prior to future CT IV Iodine scans.    Other Hives, Itching, Rash and Other (See Comments)    Moderna covid vaccine -Syncope - ICU 2 weeks Tree nuts- Hives, itching, rashes   Iodine Itching   Lactose Intolerance (Gi) Nausea And Vomiting and Other (See Comments)    Can tolerate, in small doses    Family History  Problem Relation Age of Onset   Hypertension Mother    Hypercholesterolemia Mother    Rheum arthritis Mother    Diabetes Father    Hypertension Father    Cancer Father    Autism Brother    Cancer Maternal Grandmother        Ovarian   Breast cancer Other        Paternal great aunt's breast cancer   Cancer Other         patneral great aunt's ovarian cancer    Prior to Admission medications   Medication Sig Start Date End Date Taking? Authorizing Provider  ALPRAZolam Prudy Feeler) 1 MG tablet Take 1.5 mg by mouth 2 (two) times daily.    [provider]  apixaban (ELIQUIS) 5 MG TABS tablet Take 1 tablet (5 mg total) by mouth 2 (two) times daily. Patient taking differently: Take 5 mg by mouth at bedtime. 08/25/21   Lonia Blood, MD  Baclofen 5 MG TABS Take 5 mg by mouth 2 (two) times daily as needed. Patient not taking: Reported on 08/27/2022 12/28/21   Lorriane Shire, MD  Goserelin Acetate (ZOLADEX [place]) Inject 1 Device into the skin every 3 (three) months.    [provider]  hydroxychloroquine (PLAQUENIL) 200 MG tablet Take 400 mg by mouth at bedtime.    [provider]  naproxen (EC NAPROSYN) 500 MG EC tablet Take 500-1,000 mg by mouth 4 (four) times daily as needed (for  pain).    [provider]  norethindrone (AYGESTIN) 5 MG tablet Take 1 tablet (5 mg total) by mouth daily. Patient taking differently: Take 10 mg by mouth at bedtime. 12/14/21   Henderly, Britni A, PA-C  ondansetron (ZOFRAN-ODT) 4 MG disintegrating tablet Take 1 tablet (4 mg total) by mouth every 8 (eight) hours as needed for nausea or vomiting. 08/27/22   Gailen Shelter, PA  oxyCODONE-acetaminophen (PERCOCET/ROXICET) 5-325 MG tablet Take 1 tablet by mouth every 6 (six) hours as needed for severe pain. Patient not taking: Reported on 08/27/2022 12/25/21   Benjiman Core, MD  pantoprazole (PROTONIX) 20 MG tablet Take 1 tablet (20 mg total) by mouth daily. 08/27/22   Gailen Shelter, PA  promethazine (PHENERGAN) 25 MG suppository Place 1 suppository (25 mg total) rectally every 6 (six) hours as needed for nausea or vomiting. Patient not taking: Reported on 08/27/2022 01/18/22   Benjiman Core, MD  promethazine (PHENERGAN) 25 MG tablet Take 1 tablet (25 mg total) by mouth every 6 (six) hours as needed for nausea  or vomiting. Patient not taking: Reported on 08/27/2022 12/14/21   Henderly, Britni A, PA-C  zolpidem (AMBIEN) 10 MG tablet Take 20 mg by mouth at bedtime.    [provider]  dicyclomine (BENTYL) 20 MG tablet Take 1 tablet (20 mg total) by mouth 2 (two) times daily. 11/29/18 04/11/19  Muthersbaugh, Dahlia Client, PA-C  Fluticasone Propionate HFA (FLOVENT HFA IN) Inhale into the lungs. Patient not taking: Reported on 11/14/2019  11/14/19  [provider]     Physical Exam: Vitals:   09/23/22 1845 09/23/22 1930 09/23/22 1956 09/23/22 2026  BP: (!) 142/99 108/84    Pulse: (!) 101 (!) 107 (!) 106   Resp: 20 18 19    Temp:    98 F (36.7 C)  TempSrc:      SpO2: 96% 98% 94%   Weight:      Height:        Physical Exam HENT:     Head: Normocephalic.     Nose: Nose normal.     Mouth/Throat:     Mouth: Mucous membranes are dry.  Cardiovascular:     Rate and Rhythm: Regular rhythm. Tachycardia present.     Pulses: Normal pulses.     Heart sounds: Normal heart sounds.  Pulmonary:     Effort: Pulmonary effort is normal.  Abdominal:     General: Bowel sounds are normal. There is distension.     Palpations: There is no mass.     Tenderness: There is abdominal tenderness. There is no guarding or rebound.     Hernia: No hernia is present.     Comments: Midepigastric tenderness on deep palpation  Musculoskeletal:        General: No swelling.     Cervical back: Neck supple.     Right lower leg: No edema.     Left lower leg: No edema.  Skin:    General: Skin is dry.     Capillary Refill: Capillary refill takes less than 2 seconds.  Neurological:     Mental Status: She is alert and oriented to person, place, and time.  Psychiatric:        Mood and Affect: Mood normal.        Thought Content: Thought content normal.      Labs on Admission: I have personally reviewed following labs and imaging studies  CBC: Recent Labs  Lab 09/23/22 1101  WBC 7.3  HGB  11.9*  HCT 37.0   MCV 72.8*  PLT 378   Basic Metabolic Panel: Recent Labs  Lab 09/23/22 1101  NA 138  K 3.5  CL 109  CO2 20*  GLUCOSE 120*  BUN 7  CREATININE 0.85  CALCIUM 9.0   GFR: Estimated Creatinine Clearance: 110.7 mL/min (by C-G formula based on SCr of 0.85 mg/dL). Liver Function Tests: Recent Labs  Lab 09/23/22 1101  AST 15  ALT 13  ALKPHOS 73  BILITOT 0.4  PROT 6.9  ALBUMIN 3.7   Recent Labs  Lab 09/23/22 1101  LIPASE 34   No results for input(s): "AMMONIA" in the last 168 hours. Coagulation Profile: No results for input(s): "INR", "PROTIME" in the last 168 hours. Cardiac Enzymes: Recent Labs  Lab 09/23/22 1353 09/23/22 1456  TROPONINIHS 3 5   BNP (last 3 results) No results for input(s): "BNP" in the last 8760 hours. HbA1C: No results for input(s): "HGBA1C" in the last 72 hours. CBG: No results for input(s): "GLUCAP" in the last 168 hours. Lipid Profile: No results for input(s): "CHOL", "HDL", "LDLCALC", "TRIG", "CHOLHDL", "LDLDIRECT" in the last 72 hours. Thyroid Function Tests: No results for input(s): "TSH", "T4TOTAL", "FREET4", "T3FREE", "THYROIDAB" in the last 72 hours. Anemia Panel: No results for input(s): "VITAMINB12", "FOLATE", "FERRITIN", "TIBC", "IRON", "RETICCTPCT" in the last 72 hours. Urine analysis:    Component Value Date/Time   COLORURINE YELLOW 09/23/2022 1122   APPEARANCEUR HAZY (A) 09/23/2022 1122   LABSPEC 1.016 09/23/2022 1122   PHURINE 5.0 09/23/2022 1122   GLUCOSEU NEGATIVE 09/23/2022 1122   HGBUR NEGATIVE 09/23/2022 1122   BILIRUBINUR NEGATIVE 09/23/2022 1122   KETONESUR NEGATIVE 09/23/2022 1122   PROTEINUR NEGATIVE 09/23/2022 1122   UROBILINOGEN 0.2 09/06/2021 0946   NITRITE NEGATIVE 09/23/2022 1122   LEUKOCYTESUR NEGATIVE 09/23/2022 1122    Radiological Exams on Admission: I have personally reviewed images CT Angio Chest/Abd/Pel for Dissection W and/or Wo Contrast  Result Date: 09/23/2022 CLINICAL DATA:  SHOB, CP, hx of  PE, upper GI bleed EXAM: CT ANGIOGRAPHY CHEST, ABDOMEN AND PELVIS TECHNIQUE: Non-contrast CT of the chest was initially obtained. Multidetector CT imaging through the chest, abdomen and pelvis was performed using the standard protocol during bolus administration of intravenous contrast. Multiplanar reconstructed images and MIPs were obtained and reviewed to evaluate the vascular anatomy. RADIATION DOSE REDUCTION: This exam was performed according to the departmental dose-optimization program which includes automated exposure control, adjustment of the mA and/or kV according to patient size and/or use of iterative reconstruction technique. CONTRAST:  OMNIPAQUE IOHEXOL 350 MG/ML SOLN COMPARISON:  CT abdomen pelvis 02/12/2022 FINDINGS: CTA CHEST FINDINGS Cardiovascular: Preferential opacification of the thoracic aorta. No evidence of thoracic aortic aneurysm or dissection. Normal heart size. No significant pericardial effusion. No atherosclerotic plaque of the thoracic aorta. No coronary artery calcifications. The main pulmonary artery is normal in caliber. Mediastinum/Nodes: No enlarged mediastinal, hilar, or axillary lymph nodes. Thyroid gland, trachea, and esophagus demonstrate no significant findings. Lungs/Pleura: Bilateral lower lobe atelectasis. No focal consolidation. No pulmonary nodule. No pulmonary mass. No pleural effusion. No pneumothorax. Musculoskeletal: No chest wall abnormality. No suspicious lytic or blastic osseous lesions. No acute displaced fracture. Review of the MIP images confirms the above findings. CTA ABDOMEN AND PELVIS FINDINGS VASCULAR No evidence extravasation of intravenous contrast within the gastric lumen on arterial view. Aorta: Normal caliber aorta without aneurysm, dissection, vasculitis or significant stenosis. Celiac: Patent without evidence of aneurysm, dissection, vasculitis or significant stenosis. SMA: Patent without evidence of aneurysm,  dissection, vasculitis or  significant stenosis. Renals: Both renal arteries are patent without evidence of aneurysm, dissection, vasculitis, fibromuscular dysplasia or significant stenosis. IMA: Patent without evidence of aneurysm, dissection, vasculitis or significant stenosis. Inflow: Patent without evidence of aneurysm, dissection, vasculitis or significant stenosis. Veins: No obvious venous abnormality within the limitations of this arterial phase study. Review of the MIP images confirms the above findings. NON-VASCULAR Hepatobiliary: Pericentimeter fluid density likely represents simple renal cyst. Status post cholecystectomy. No biliary dilatation. Pancreas: No focal lesion. Normal pancreatic contour. No surrounding inflammatory changes. No main pancreatic ductal dilatation. Spleen: Normal in size without focal abnormality. Adrenals/Urinary Tract: No adrenal nodule bilaterally. Bilateral kidneys enhance symmetrically. No hydronephrosis. No hydroureter. The urinary bladder is unremarkable. Stomach/Bowel: Stomach is within normal limits. No evidence of bowel wall thickening or dilatation. Status post appendectomy. Lymphatic: No lymphadenopathy. Reproductive: Uterus and bilateral adnexa are unremarkable. Other: No intraperitoneal free fluid. No intraperitoneal free gas. No organized fluid collection. Musculoskeletal: No abdominal wall hernia or abnormality. No suspicious lytic or blastic osseous lesions. No acute displaced fracture. Review of the MIP images confirms the above findings. IMPRESSION: 1. No acute thoracic or abdominal aorta abnormality. 2. No acute intra-abdominal or intrapelvic abnormality. 3. Status post cholecystectomy and appendectomy. Electronically Signed   By: Tish Frederickson M.D.   On: 09/23/2022 19:41    EKG: My personal interpretation of EKG shows: Sinus tachycardia heart rate 102.  Borderline T wave abnormality.  Comparing EKG from previous from 12/2021 unchanged finding.    Assessment/Plan: Principal  Problem:   Hematemesis Active Problems:   Intractable abdominal pain   History of gastritis   Concern for Mallory-Weiss tear   Intractable nausea and vomiting   Chronic anemia   Endometriosis   SLE (systemic lupus erythematosus) (HCC)   History of pulmonary embolus (PE)   MDD (major depressive disorder)   Metabolic acidosis   Hematemesis with nausea    Assessment and Plan: Hematemesis Intractable nausea vomiting Intractable abdominal pain History of gastritis Concern for Mallory-Weiss tear History of chronic gastritis >Patient has history of chronic gastritis and on top of that she is taking NSAID due to endometrial pain.  Patient coming with complaining of nausea vomiting with associated midepigastric pain.  She started noticing coffee-ground emesis since this morning.  She has 4 episodes of vomiting up blood mixed with her fluid.  In the ED she has 1 episode of coffee-ground emesis than bright red blood 1 episode.  In the ED received multiple doses of Benadryl, Dilaudid and Zofran. - Normal lipase level.  CMP unremarkable. - Stable  H&H 11.9 and 37.  Hemoglobin around 13 3 weeks ago however per chart review patient's baseline hemoglobin around 10-11 for last 1 year. -CT chest and abdomen pelvis no acute intra-abdominal, intrapelvic, intrathoracic and abdominal aorta abnormality.  Status postcholecystectomy and appendectomy. - Commend is likely secondary in the setting of gastritis from use of NSAID.  EDP physician discussed and consulted case with [facility] Dr. Marina Goodell, recommended keep patient n.p.o. and possible need for EGD. -Plan to continue IV Protonix 80 mg twice daily. -Continue to monitor CBC 3 times daily.  Checking type and screen.-Checking stool H. pylori antigen. - Continue Dilaudid 1 mg every 4 hour as needed and morphine 1 mg every 2 hour for severe and breakthrough pain. - Continue Zofran and Phenergan as needed. -Keeping patient NPO.  Continue maintenance fluid D5 LR  75 cc/h for 1 day. -Will follow-up with gastroenterology plan.  Appreciate input.  Metabolic acidosis -Slight low bicarb 20.  Normal anion gap.  Normal anion gap metabolic acidosis in the setting of vomiting. -Continue hydration.  Continue monitor bicarb level  Chronic anemia -Patient has history of chronic anemia due to irregular vaginal bleeding - Currently hemoglobin 11.  Baseline hemoglobin around 10-13.  Continue to monitor. -Check anemia panel.   History of endometriosis -Patient has history of endometriosis.  Currently she has been ran out of IV progesterone therapy.  Reported need to establish care with gynecology in [place].  History of lupus -Patient has history of systemic lupus erythematous.  She used to taking Plaquenil however not taking for last few months as she lost follow-up with rheumatology. - Need to refer to rheumatology on discharge to establish care.  History of PE -Reported history of PE 3 episodes for last few years.  She was on on and off Eliquis 5 mg twice daily.  Currently not taking Eliquis for last 1 month - CT chest did not show any evidence of PE. - Also holding Eliquis as patient has active GI bleed. - On discharge patient need to establish care with hematology for chronic anticoagulation management.  History of depression  -Resumed Xanax 1.5 mg twice daily as needed.  DVT prophylaxis:  SCDs.  Holding pharmacological prophylaxis in the setting of GI bleed Code Status:  Full Code Diet: Currently n.p.o. Family Communication:  none Disposition Plan: Pending GI formal evaluation and possible EGD.  Pending clinical improvement of nausea, vomiting and abdominal pain. Consults: Gastroenterology Admission status:   Inpatient, Telemetry bed  Severity of Illness: The appropriate patient status for this patient is INPATIENT. Inpatient status is judged to be reasonable and necessary in order to provide the required intensity of service to ensure the  patient's safety. The patient's presenting symptoms, physical exam findings, and initial radiographic and laboratory data in the context of their chronic comorbidities is felt to place them at high risk for further clinical deterioration. Furthermore, it is not anticipated that the patient will be medically stable for discharge from the hospital within 2 midnights of admission.   * I certify that at the point of admission it is my clinical judgment that the patient will require inpatient hospital care spanning beyond 2 midnights from the point of admission due to high intensity of service, high risk for further deterioration and high frequency of surveillance required.Marland Kitchen    Tereasa Coop, MD [facility]  How to contact the [facility] Attending or Consulting provider 7A - 7P or covering provider during after hours 7P -7A, for this patient.  Check the care team in [facility] and look for a) attending/consulting [facility] provider listed and b) the [facility] team listed Log into [email or web address] and use [place]'s universal password to access. If you do not have the password, please contact the hospital operator. Locate the [facility] provider you are looking for under [facility] and page to a number that you can be directly reached. If you still have difficulty reaching the provider, please page the [facility] (Director on Call) for the Hospitalists listed on amion for assistance.  09/23/2022, 9:58 PM

## 2022-09-23 NOTE — ED Provider Notes (Signed)
[name]   CSN: [id number] Arrival date & time: 09/23/22  1023     History  Chief Complaint  Patient presents with   Hematemesis    [name] is a 27 y.o. female with past medical history significant for lupus, celiac disease, endometriosis, PE, seizures, gastritis, MDD, bipolar II disorder, ovarian tumor presents to the ED complaining of hematemesis, severe abdominal pain, chest pain, and shortness of breath.  Patient states she began vomiting blood last night.  She has been taking a large amount of NSAIDs daily to treat her endometriosis pain.  Patient has chemo implant (Zoladex) to treat endometriosis, and she has not been able to get a new one placed.  She states she has also not been taking her Eliquis as prescribed due to running out approximately 1 month ago.  Denies melena, hematochezia, fever, chills.  She has had very few bowel movements.  Patient reports she has been taking at least 10 ibuprofen daily.        Home Medications Prior to Admission medications   Medication Sig Start Date End Date Taking? Authorizing Provider  ALPRAZolam Prudy Feeler) 1 MG tablet Take 1.5 mg by mouth 2 (two) times daily.    [provider]  apixaban (ELIQUIS) 5 MG TABS tablet Take 1 tablet (5 mg total) by mouth 2 (two) times daily. Patient taking differently: Take 5 mg by mouth at bedtime. 08/25/21   Lonia Blood, MD  Baclofen 5 MG TABS Take 5 mg by mouth 2 (two) times daily as needed. Patient not taking: Reported on 08/27/2022 12/28/21   Lorriane Shire, MD  Goserelin Acetate (ZOLADEX [place]) Inject 1 Device into the skin every 3 (three) months.    [provider]  hydroxychloroquine (PLAQUENIL) 200 MG tablet Take 400 mg by mouth at bedtime.    [provider]  naproxen (EC NAPROSYN) 500 MG EC tablet Take 500-1,000 mg by mouth 4 (four) times daily as needed (for pain).    [provider]   norethindrone (AYGESTIN) 5 MG tablet Take 1 tablet (5 mg total) by mouth daily. Patient taking differently: Take 10 mg by mouth at bedtime. 12/14/21   Henderly, Britni A, PA-C  ondansetron (ZOFRAN-ODT) 4 MG disintegrating tablet Take 1 tablet (4 mg total) by mouth every 8 (eight) hours as needed for nausea or vomiting. 08/27/22   Gailen Shelter, PA  oxyCODONE-acetaminophen (PERCOCET/ROXICET) 5-325 MG tablet Take 1 tablet by mouth every 6 (six) hours as needed for severe pain. Patient not taking: Reported on 08/27/2022 12/25/21   Benjiman Core, MD  pantoprazole (PROTONIX) 20 MG tablet Take 1 tablet (20 mg total) by mouth daily. 08/27/22   Gailen Shelter, PA  promethazine (PHENERGAN) 25 MG suppository Place 1 suppository (25 mg total) rectally every 6 (six) hours as needed for nausea or vomiting. Patient not taking: Reported on 08/27/2022 01/18/22   Benjiman Core, MD  promethazine (PHENERGAN) 25 MG tablet Take 1 tablet (25 mg total) by mouth every 6 (six) hours as needed for nausea or vomiting. Patient not taking: Reported on 08/27/2022 12/14/21   Henderly, Britni A, PA-C  zolpidem (AMBIEN) 10 MG tablet Take 20 mg by mouth at bedtime.    [provider]  dicyclomine (BENTYL) 20 MG tablet Take 1 tablet (20 mg total) by mouth 2 (two) times daily. 11/29/18 04/11/19  Muthersbaugh, Dahlia Client, PA-C  Fluticasone Propionate HFA (FLOVENT HFA IN) Inhale into the lungs. Patient not taking: Reported  on 11/14/2019  11/14/19  [provider]      Allergies    Amoxicillin, Azithromycin, Fish-derived products, Peanut-containing drug products, Gluten meal, Iodinated contrast media, Other, Iodine, and Lactose intolerance (gi)    Review of Systems   Review of Systems  Constitutional:  Negative for chills and fever.  Respiratory:  Positive for shortness of breath.   Cardiovascular:  Positive for chest pain.  Gastrointestinal:  Positive for abdominal pain, nausea and vomiting. Negative for blood in  stool and diarrhea.       Hematemesis    Physical Exam Updated Vital Signs BP 108/84   Pulse (!) 106   Temp 98 F (36.7 C)   Resp 19   Ht 5\' 2"  (1.575 m)   Wt 104.3 kg   SpO2 94%   BMI 42.07 kg/m  Physical Exam Vitals and nursing Henry reviewed.  Constitutional:      General: She is not in acute distress.    Appearance: Normal appearance. She is not ill-appearing or diaphoretic.  Cardiovascular:     Rate and Rhythm: Normal rate and regular rhythm.     Heart sounds: Normal heart sounds.  Pulmonary:     Effort: Pulmonary effort is normal. No tachypnea or respiratory distress.     Breath sounds: Normal breath sounds and air entry. No decreased breath sounds.  Abdominal:     General: Abdomen is flat.     Palpations: Abdomen is soft.     Tenderness: There is generalized abdominal tenderness. There is no right CVA tenderness, left CVA tenderness, guarding or rebound.     Comments: Generalized abdominal pain on exam, worse in epigastric region.   Skin:    General: Skin is warm and dry.     Capillary Refill: Capillary refill takes less than 2 seconds.  Neurological:     Mental Status: She is alert. Mental status is at baseline.  Psychiatric:        Mood and Affect: Mood normal.        Behavior: Behavior normal.     ED Results / Procedures / Treatments   Labs (all labs ordered are listed, but only abnormal results are displayed) Labs Reviewed  COMPREHENSIVE METABOLIC PANEL - Abnormal; Notable for the following components:      Result Value   CO2 20 (*)    Glucose, Bld 120 (*)    All other components within normal limits  CBC - Abnormal; Notable for the following components:   Hemoglobin 11.9 (*)    MCV 72.8 (*)    MCH 23.4 (*)    RDW 16.3 (*)    All other components within normal limits  URINALYSIS, ROUTINE W REFLEX MICROSCOPIC - Abnormal; Notable for the following components:   APPearance HAZY (*)    All other components within normal limits  LIPASE, BLOOD  HCG,  SERUM, QUALITATIVE  TROPONIN I (HIGH SENSITIVITY)  TROPONIN I (HIGH SENSITIVITY)    EKG EKG Interpretation Date/Time:  Sunday September 23 2022 17:53:54 EDT Ventricular Rate:  102 PR Interval:  167 QRS Duration:  92 QT Interval:  356 QTC Calculation: 464 R Axis:   21  Text Interpretation: Sinus tachycardia Borderline T wave abnormalities Confirmed by Anders Simmonds [phone]) on 09/23/2022 5:57:27 PM  Radiology CT Angio Chest/Abd/Pel for Dissection W and/or Wo Contrast  Result Date: 09/23/2022 CLINICAL DATA:  SHOB, CP, hx of PE, upper GI bleed EXAM: CT ANGIOGRAPHY CHEST, ABDOMEN AND PELVIS TECHNIQUE: Non-contrast CT of the chest was initially obtained. Multidetector  CT imaging through the chest, abdomen and pelvis was performed using the standard protocol during bolus administration of intravenous contrast. Multiplanar reconstructed images and MIPs were obtained and reviewed to evaluate the vascular anatomy. RADIATION DOSE REDUCTION: This exam was performed according to the departmental dose-optimization program which includes automated exposure control, adjustment of the mA and/or kV according to patient size and/or use of iterative reconstruction technique. CONTRAST:  OMNIPAQUE IOHEXOL 350 MG/ML SOLN COMPARISON:  CT abdomen pelvis 02/12/2022 FINDINGS: CTA CHEST FINDINGS Cardiovascular: Preferential opacification of the thoracic aorta. No evidence of thoracic aortic aneurysm or dissection. Normal heart size. No significant pericardial effusion. No atherosclerotic plaque of the thoracic aorta. No coronary artery calcifications. The main pulmonary artery is normal in caliber. Mediastinum/Nodes: No enlarged mediastinal, hilar, or axillary lymph nodes. Thyroid gland, trachea, and esophagus demonstrate no significant findings. Lungs/Pleura: Bilateral lower lobe atelectasis. No focal consolidation. No pulmonary nodule. No pulmonary mass. No pleural effusion. No pneumothorax. Musculoskeletal: No chest  wall abnormality. No suspicious lytic or blastic osseous lesions. No acute displaced fracture. Review of the MIP images confirms the above findings. CTA ABDOMEN AND PELVIS FINDINGS VASCULAR No evidence extravasation of intravenous contrast within the gastric lumen on arterial view. Aorta: Normal caliber aorta without aneurysm, dissection, vasculitis or significant stenosis. Celiac: Patent without evidence of aneurysm, dissection, vasculitis or significant stenosis. SMA: Patent without evidence of aneurysm, dissection, vasculitis or significant stenosis. Renals: Both renal arteries are patent without evidence of aneurysm, dissection, vasculitis, fibromuscular dysplasia or significant stenosis. IMA: Patent without evidence of aneurysm, dissection, vasculitis or significant stenosis. Inflow: Patent without evidence of aneurysm, dissection, vasculitis or significant stenosis. Veins: No obvious venous abnormality within the limitations of this arterial phase study. Review of the MIP images confirms the above findings. NON-VASCULAR Hepatobiliary: Pericentimeter fluid density likely represents simple renal cyst. Status post cholecystectomy. No biliary dilatation. Pancreas: No focal lesion. Normal pancreatic contour. No surrounding inflammatory changes. No main pancreatic ductal dilatation. Spleen: Normal in size without focal abnormality. Adrenals/Urinary Tract: No adrenal nodule bilaterally. Bilateral kidneys enhance symmetrically. No hydronephrosis. No hydroureter. The urinary bladder is unremarkable. Stomach/Bowel: Stomach is within normal limits. No evidence of bowel wall thickening or dilatation. Status post appendectomy. Lymphatic: No lymphadenopathy. Reproductive: Uterus and bilateral adnexa are unremarkable. Other: No intraperitoneal free fluid. No intraperitoneal free gas. No organized fluid collection. Musculoskeletal: No abdominal wall hernia or abnormality. No suspicious lytic or blastic osseous lesions. No  acute displaced fracture. Review of the MIP images confirms the above findings. IMPRESSION: 1. No acute thoracic or abdominal aorta abnormality. 2. No acute intra-abdominal or intrapelvic abnormality. 3. Status post cholecystectomy and appendectomy. Electronically Signed   By: Tish Frederickson M.D.   On: 09/23/2022 19:41    Procedures Procedures    Medications Ordered in ED Medications  ondansetron (ZOFRAN) injection 4 mg (4 mg Intravenous Given 09/23/22 1343)  HYDROmorphone (DILAUDID) injection 1 mg (1 mg Intravenous Given 09/23/22 1341)  diphenhydrAMINE (BENADRYL) injection 50 mg (50 mg Intravenous Given 09/23/22 1348)  pantoprazole (PROTONIX) injection 40 mg (40 mg Intravenous Given 09/23/22 1344)  sodium chloride 0.9 % bolus 1,000 mL (0 mLs Intravenous Stopped 09/23/22 2004)  methylPREDNISolone sodium succinate (SOLU-MEDROL) 40 mg/mL injection 40 mg (40 mg Intravenous Given 09/23/22 1449)  diphenhydrAMINE (BENADRYL) capsule 50 mg ( Oral See Alternative 09/23/22 1738)    Or  diphenhydrAMINE (BENADRYL) injection 50 mg (50 mg Intravenous Given 09/23/22 1738)  ondansetron (ZOFRAN) injection 4 mg (4 mg Intravenous Given 09/23/22 1540)  HYDROmorphone (DILAUDID) injection  1 mg (1 mg Intravenous Given 09/23/22 1539)  promethazine (PHENERGAN) 25 mg in sodium chloride 0.9 % 50 mL IVPB (0 mg Intravenous Stopped 09/23/22 2004)  HYDROmorphone (DILAUDID) injection 0.5 mg (0.5 mg Intravenous Given 09/23/22 1841)  iohexol (OMNIPAQUE) 350 MG/ML injection 100 mL (100 mLs Intravenous Contrast Given 09/23/22 1918)  HYDROmorphone (DILAUDID) injection 0.5 mg (0.5 mg Intravenous Given 09/23/22 1953)    ED Course/ Medical Decision Making/ A&P                                 Medical Decision Making Amount and/or Complexity of Data Reviewed Labs: ordered.   This patient presents to the ED with chief complaint(s) of hematemesis, abdominal pain, chest pain, shortness of breath with pertinent past medical history of severe  endometriosis, celiac disease, SLE, PE.  The complaint involves an extensive differential diagnosis and also carries with it a high risk of complications and morbidity.    The differential diagnosis includes acute gastritis with hemorrhage, pancreatitis, colitis, ACS, PE  The initial plan is to obtain labs  Additional history obtained: Records reviewed previous admission documents in [place].  Patient has had extensive workup regarding ongoing pelvic and abdominal pain.  Patient had non contrast CT on 07/28/22 which demonstrated no acute findings.  Patient seen in ED on 8/5 and 8/8 for lower abdominal pain.  US done on 8/8 limited by body habitus, patient declined transvaginal portion of exam.  No uterine mass was identified, ovaries not visualized.   Initial Assessment:   On exam, patient does not appear to be in acute distress.  No active vomiting.  Emesis bags at beside with bright red bloody emesis.  Abdomen is soft with generalized tenderness, worse in epigastric region.  Lungs are clear to auscultation bilaterally.  Heart rate is normal in the 90s with regular rhythm.  Skin is warm and dry.  Patient did have emesis while in the ED, appears to be frank red blood, no coffee-ground emesis.    Independent ECG/labs interpretation:  The following labs were independently interpreted:  CBC with mild anemia, no leukocytosis.    Independent visualization and interpretation of imaging: I independently visualized the following imaging with scope of interpretation limited to determining acute life threatening conditions related to emergency care: CT chest/abd/pelvis, which revealed no acute pathology to explain patient's symptoms.    Treatment and Reassessment: Patient was given IV Zofran with minimal improvement in nausea and vomiting.  IV phenergan given, improved nausea.  Patient has not had any further episodes of vomiting.  Patient given multiple doses of Dilaudid with continued abdominal pain.  She  reports improvement, but severe pain returns.   Consultations obtained:   I requested consultation with on-call GI provider and spoke with Dr. Marina Goodell with [facility] who agreed to have GI come see patient in AM if she was to be admitted.   I spoke with on-call hospitalist provider, Dr. Janalyn Shy, about patient.  She agreed to come see patient for hospital admission.  Disposition:   Patient to be admitted to [facility] for intractable abdominal pain, nausea, and vomiting with hematemesis.            Final Clinical Impression(s) / ED Diagnoses Final diagnoses:  Hematemesis with nausea  Intractable abdominal pain    Rx / DC Orders ED Discharge Orders     None         Lenard Simmer, PA-C 09/23/22 2110  Anders Simmonds T, DO 09/24/22 2315

## 2022-09-23 NOTE — ED Triage Notes (Signed)
Pt arrived POV from home c/o hematemesis, abdominal pain and some chest pain. Pt has stage 4 endometriosis cancer and has pain all over her body.

## 2022-09-23 NOTE — ED Notes (Signed)
RN provided emesis bag d/t vomiting. Pt was given cloth to clean face. Pt says she has been going through a lot and just need help with finding the right providers to assist her.

## 2022-09-23 NOTE — ED Notes (Signed)
Pt took zofran and regular medications but u/t keep anything down.

## 2022-09-23 NOTE — ED Notes (Signed)
Pt states she is very worried about the blood seen in her vomit recently. Pt would like to know if GI will be consulted to see if a GED needs to be completed. Pt says pain and nausea has returned. RN notified EDP

## 2022-09-23 NOTE — ED Notes (Signed)
RN attempted two times to place an IV without success

## 2022-09-23 NOTE — ED Notes (Signed)
Pt was given Solumedrol at 14:45  Benadryl should be administered 17:45  CT to scan at 18:45

## 2022-09-23 NOTE — ED Notes (Signed)
Pt states she hasn't had a CT scan and would like one.

## 2022-09-23 NOTE — ED Notes (Signed)
ED TO INPATIENT HANDOFF REPORT  ED Nurse Name and Phone #: Pennie Rushing A. Najiyah Paris, RN [phone]  S Name/Age/Gender [name] 27 y.o. female Room/Bed: 012C/012C  Code Status   Code Status: Full Code  Home/SNF/Other Home Patient oriented to: self, place, time, and situation Is this baseline? Yes   Triage Complete: Triage complete  Chief Complaint Hematemesis with nausea [K92.0]  Triage Note Pt arrived POV from home c/o hematemesis, abdominal pain and some chest pain. Pt has stage 4 endometriosis cancer and has pain all over her body.    Allergies Allergies  Allergen Reactions   Amoxicillin Anaphylaxis, Swelling and Other (See Comments)    Hand swelling (PENFAST 4+) Tolerated ceftriaxone 2021 PenFast Score 4+:  High risk of positive penicillin allergy (50%) Oral challenge NOT recommended  Cephalosporins may be considered under close monitoring   Azithromycin Anaphylaxis and Other (See Comments)   Fish-Derived Products Anaphylaxis and Other (See Comments)    Eats shellfish alright   Peanut-Containing Drug Products Hives, Itching, Rash and Other (See Comments)         Gluten Meal Itching, Rash and Other (See Comments)    Celiac disease   Iodinated Contrast Media Hives and Itching    02/04/2020 Patient stated has history of itching and hives with CT IV Iodine contrast, premedicated with Benadryl 25 mg IV prior to CT IV Iodine scan today.Post CT scan patient reported itching, Benadryl 25 mg IV given, symptoms resolved, NP recommends for patient to get premedicated with Benadryl 50 mg IV prior to future CT IV Iodine scans.    Other Hives, Itching, Rash and Other (See Comments)    Moderna covid vaccine -Syncope - ICU 2 weeks Tree nuts- Hives, itching, rashes   Iodine Itching   Lactose Intolerance (Gi) Nausea And Vomiting and Other (See Comments)    Can tolerate, in small doses    Level of Care/Admitting Diagnosis ED Disposition     ED Disposition  Admit   Condition  --    Comment  Hospital Area: [facility] [[id number]]  Level of Care: Telemetry Medical [104]  May admit patient to [facility] or [facility] if equivalent level of care is available:: No  Covid Evaluation: Asymptomatic - no recent exposure (last 10 days) testing not required  Diagnosis: Hematemesis with nausea [[id number]]  Admitting Physician: Tereasa Coop [[id number]]  Attending Physician: Tereasa Coop [[id number]]  Certification:: I certify this patient will need inpatient services for at least 2 midnights  Expected Medical Readiness: 09/27/2022          B Medical/Surgery History 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   Collagen vascular disease (HCC)    Diarrhea    Patient mentions diagnosis of ulcerative colitis but it is not clear she actually has UC   Endometriosis    Lupus (HCC)    Ovarian cyst    Panic disorder 05/30/2020   Pulmonary embolus (HCC) 02/06/2020   Seizures (HCC)    Past Surgical History:  Procedure Laterality Date   APPENDECTOMY     CHOLECYSTECTOMY  2015   ESOPHAGOGASTRODUODENOSCOPY (EGD) WITH PROPOFOL N/A 06/18/2020   Procedure: ESOPHAGOGASTRODUODENOSCOPY (EGD) WITH PROPOFOL;  Surgeon: Sherrilyn Rist, MD;  Location: [facility] ENDOSCOPY;  Service: Gastroenterology;  Laterality: N/A;   EXCISION OF ENDOMETRIOMA     Ovarian cyst removal   excision of endometriosis     FOOT FRACTURE SURGERY  w/ hardware   HERNIA REPAIR     TONSILLECTOMY       A IV Location/Drains/Wounds Patient Lines/Drains/Airways Status     Active Line/Drains/Airways     Name Placement date Placement time Site Days   Peripheral IV 09/23/22 20 G 1.88" Anterior;Proximal;Right Forearm 09/23/22  1331  Forearm  less than 1            Intake/Output Last 24 hours No intake or output data in the 24 hours ending 09/23/22 2211  Labs/Imaging Results for orders placed or performed during the  hospital encounter of 09/23/22 (from the past 48 hour(s))  Lipase, blood     Status: None   Collection Time: 09/23/22 11:01 AM  Result Value Ref Range   Lipase 34 11 - 51 U/L    Comment: Performed at [facility], [address], [place], [place] [address]  Comprehensive metabolic panel     Status: Abnormal   Collection Time: 09/23/22 11:01 AM  Result Value Ref Range   Sodium 138 135 - 145 mmol/L   Potassium 3.5 3.5 - 5.1 mmol/L   Chloride 109 98 - 111 mmol/L   CO2 20 (L) 22 - 32 mmol/L   Glucose, Bld 120 (H) 70 - 99 mg/dL    Comment: Glucose reference range applies only to samples taken after fasting for at least 8 hours.   BUN 7 6 - 20 mg/dL   Creatinine, Ser 6.04 0.44 - 1.00 mg/dL   Calcium 9.0 8.9 - 54.0 mg/dL   Total Protein 6.9 6.5 - 8.1 g/dL   Albumin 3.7 3.5 - 5.0 g/dL   AST 15 15 - 41 U/L   ALT 13 0 - 44 U/L   Alkaline Phosphatase 73 38 - 126 U/L   Total Bilirubin 0.4 0.3 - 1.2 mg/dL   GFR, Estimated >98 >11 mL/min    Comment: (NOTE) Calculated using the CKD-EPI Creatinine Equation (2021)    Anion gap 9 5 - 15    Comment: Performed at [facility], [address], [place], [place] [address]  CBC     Status: Abnormal   Collection Time: 09/23/22 11:01 AM  Result Value Ref Range   WBC 7.3 4.0 - 10.5 K/uL   RBC 5.08 3.87 - 5.11 MIL/uL   Hemoglobin 11.9 (L) 12.0 - 15.0 g/dL   HCT 29.5 62.1 - 30.8 %   MCV 72.8 (L) 80.0 - 100.0 fL   MCH 23.4 (L) 26.0 - 34.0 pg   MCHC 32.2 30.0 - 36.0 g/dL   RDW 65.7 (H) 84.6 - 96.2 %   Platelets 378 150 - 400 K/uL   nRBC 0.0 0.0 - 0.2 %    Comment: Performed at [facility], [address] [place], [place] [address]  hCG, serum, qualitative     Status: None   Collection Time: 09/23/22 11:01 AM  Result Value Ref Range   Preg, Serum NEGATIVE NEGATIVE    Comment:        THE SENSITIVITY OF THIS METHODOLOGY IS >10 mIU/mL. Performed at [facility], [address], [place], [place] [address]   Urinalysis,  Routine w reflex microscopic -Urine, Clean Catch     Status: Abnormal   Collection Time: 09/23/22 11:22 AM  Result Value Ref Range   Color, Urine YELLOW YELLOW   APPearance HAZY (A) CLEAR   Specific Gravity, Urine 1.016 1.005 - 1.030   pH 5.0 5.0 - 8.0   Glucose, UA NEGATIVE  NEGATIVE mg/dL   Hgb urine dipstick NEGATIVE NEGATIVE   Bilirubin Urine NEGATIVE NEGATIVE   Ketones, ur NEGATIVE NEGATIVE mg/dL   Protein, ur NEGATIVE NEGATIVE mg/dL   Nitrite NEGATIVE NEGATIVE   Leukocytes,Ua NEGATIVE NEGATIVE    Comment: Performed at [facility], [address], [place], [place] [address]  Troponin I (High Sensitivity)     Status: None   Collection Time: 09/23/22  1:53 PM  Result Value Ref Range   Troponin I (High Sensitivity) 3 <18 ng/L    Comment: (NOTE) Elevated high sensitivity troponin I (hsTnI) values and significant  changes across serial measurements may suggest ACS but many other  chronic and acute conditions are known to elevate hsTnI results.  Refer to the "Links" section for chest pain algorithms and additional  guidance. Performed at [facility], [address], [place], [place] [address]   Troponin I (High Sensitivity)     Status: None   Collection Time: 09/23/22  2:56 PM  Result Value Ref Range   Troponin I (High Sensitivity) 5 <18 ng/L    Comment: (NOTE) Elevated high sensitivity troponin I (hsTnI) values and significant  changes across serial measurements may suggest ACS but many other  chronic and acute conditions are known to elevate hsTnI results.  Refer to the "Links" section for chest pain algorithms and additional  guidance. Performed at [facility], [address] [place], [place] [address]    CT Angio Chest/Abd/Pel for Dissection W and/or Wo Contrast  Result Date: 09/23/2022 CLINICAL DATA:  SHOB, CP, hx of PE, upper GI bleed EXAM: CT ANGIOGRAPHY CHEST, ABDOMEN AND PELVIS TECHNIQUE: Non-contrast CT of the chest was initially obtained.  Multidetector CT imaging through the chest, abdomen and pelvis was performed using the standard protocol during bolus administration of intravenous contrast. Multiplanar reconstructed images and MIPs were obtained and reviewed to evaluate the vascular anatomy. RADIATION DOSE REDUCTION: This exam was performed according to the departmental dose-optimization program which includes automated exposure control, adjustment of the mA and/or kV according to patient size and/or use of iterative reconstruction technique. CONTRAST:  OMNIPAQUE IOHEXOL 350 MG/ML SOLN COMPARISON:  CT abdomen pelvis 02/12/2022 FINDINGS: CTA CHEST FINDINGS Cardiovascular: Preferential opacification of the thoracic aorta. No evidence of thoracic aortic aneurysm or dissection. Normal heart size. No significant pericardial effusion. No atherosclerotic plaque of the thoracic aorta. No coronary artery calcifications. The main pulmonary artery is normal in caliber. Mediastinum/Nodes: No enlarged mediastinal, hilar, or axillary lymph nodes. Thyroid gland, trachea, and esophagus demonstrate no significant findings. Lungs/Pleura: Bilateral lower lobe atelectasis. No focal consolidation. No pulmonary nodule. No pulmonary mass. No pleural effusion. No pneumothorax. Musculoskeletal: No chest wall abnormality. No suspicious lytic or blastic osseous lesions. No acute displaced fracture. Review of the MIP images confirms the above findings. CTA ABDOMEN AND PELVIS FINDINGS VASCULAR No evidence extravasation of intravenous contrast within the gastric lumen on arterial view. Aorta: Normal caliber aorta without aneurysm, dissection, vasculitis or significant stenosis. Celiac: Patent without evidence of aneurysm, dissection, vasculitis or significant stenosis. SMA: Patent without evidence of aneurysm, dissection, vasculitis or significant stenosis. Renals: Both renal arteries are patent without evidence of aneurysm, dissection, vasculitis, fibromuscular  dysplasia or significant stenosis. IMA: Patent without evidence of aneurysm, dissection, vasculitis or significant stenosis. Inflow: Patent without evidence of aneurysm, dissection, vasculitis or significant stenosis. Veins: No obvious venous abnormality within the limitations of this arterial phase study. Review of the MIP images confirms the above findings. NON-VASCULAR Hepatobiliary: Pericentimeter  fluid density likely represents simple renal cyst. Status post cholecystectomy. No biliary dilatation. Pancreas: No focal lesion. Normal pancreatic contour. No surrounding inflammatory changes. No main pancreatic ductal dilatation. Spleen: Normal in size without focal abnormality. Adrenals/Urinary Tract: No adrenal nodule bilaterally. Bilateral kidneys enhance symmetrically. No hydronephrosis. No hydroureter. The urinary bladder is unremarkable. Stomach/Bowel: Stomach is within normal limits. No evidence of bowel wall thickening or dilatation. Status post appendectomy. Lymphatic: No lymphadenopathy. Reproductive: Uterus and bilateral adnexa are unremarkable. Other: No intraperitoneal free fluid. No intraperitoneal free gas. No organized fluid collection. Musculoskeletal: No abdominal wall hernia or abnormality. No suspicious lytic or blastic osseous lesions. No acute displaced fracture. Review of the MIP images confirms the above findings. IMPRESSION: 1. No acute thoracic or abdominal aorta abnormality. 2. No acute intra-abdominal or intrapelvic abnormality. 3. Status post cholecystectomy and appendectomy. Electronically Signed   By: Tish Frederickson M.D.   On: 09/23/2022 19:41    Pending Labs Unresulted Labs (From admission, onward)     Start     Ordered   09/24/22 0500  Comprehensive metabolic panel  Daily,   R      09/23/22 2157   09/23/22 2155  Vitamin B12  (Anemia Panel (PNL))  Add-on,   AD        09/23/22 2154   09/23/22 2155  Folate  (Anemia Panel (PNL))  Add-on,   AD        09/23/22 2154   09/23/22  2155  Iron and TIBC  (Anemia Panel (PNL))  Add-on,   AD        09/23/22 2154   09/23/22 2155  Ferritin  (Anemia Panel (PNL))  Add-on,   AD        09/23/22 2154   09/23/22 2155  Reticulocytes  (Anemia Panel (PNL))  Add-on,   AD        09/23/22 2154   09/23/22 2155  H. pylori antigen, stool  Once,   R       Question:  Release to patient  Answer:  Immediate   09/23/22 2154   09/23/22 2141  CBC  3 times daily,   R (with TIMED occurrences)      09/23/22 2140   09/23/22 2139  HIV Antibody (routine testing w rflx)  (HIV Antibody (Routine testing w reflex) panel)  Once,   R        09/23/22 2138   09/23/22 2139  Type and screen [facility]  Once,   R       Comments: [facility]   Question:  Release to patient  Answer:  Immediate   09/23/22 2138            Vitals/Pain Today's Vitals   09/23/22 1845 09/23/22 1930 09/23/22 1956 09/23/22 2026  BP: (!) 142/99 108/84    Pulse: (!) 101 (!) 107 (!) 106   Resp: 20 18 19    Temp:    98 F (36.7 C)  TempSrc:      SpO2: 96% 98% 94%   Weight:      Height:      PainSc:        Isolation Precautions No active isolations  Medications Medications  HYDROmorphone (DILAUDID) injection 1 mg (has no administration in time range)  ondansetron (ZOFRAN) injection 4 mg (has no administration in time range)  promethazine (PHENERGAN) 25 mg in sodium chloride 0.9 % 50 mL IVPB (has no administration in time range)  pantoprazole (PROTONIX) 80 mg /NS 100  mL IVPB (has no administration in time range)  sodium chloride flush (NS) 0.9 % injection 3 mL (0 mLs Intravenous Hold 09/23/22 2146)  sodium chloride flush (NS) 0.9 % injection 3 mL (0 mLs Intravenous Hold 09/23/22 2147)  sodium chloride flush (NS) 0.9 % injection 3 mL (has no administration in time range)  0.9 %  sodium chloride infusion (has no administration in time range)  acetaminophen (TYLENOL) tablet 650 mg (has no administration in time range)    Or  acetaminophen  (TYLENOL) suppository 650 mg (has no administration in time range)  morphine (PF) 2 MG/ML injection 1 mg (has no administration in time range)  dextrose 5 % in lactated ringers infusion (has no administration in time range)  ALPRAZolam (XANAX) tablet 1.5 mg (has no administration in time range)  ondansetron (ZOFRAN) injection 4 mg (4 mg Intravenous Given 09/23/22 1343)  HYDROmorphone (DILAUDID) injection 1 mg (1 mg Intravenous Given 09/23/22 1341)  diphenhydrAMINE (BENADRYL) injection 50 mg (50 mg Intravenous Given 09/23/22 1348)  pantoprazole (PROTONIX) injection 40 mg (40 mg Intravenous Given 09/23/22 1344)  sodium chloride 0.9 % bolus 1,000 mL (0 mLs Intravenous Stopped 09/23/22 2004)  methylPREDNISolone sodium succinate (SOLU-MEDROL) 40 mg/mL injection 40 mg (40 mg Intravenous Given 09/23/22 1449)  diphenhydrAMINE (BENADRYL) capsule 50 mg ( Oral See Alternative 09/23/22 1738)    Or  diphenhydrAMINE (BENADRYL) injection 50 mg (50 mg Intravenous Given 09/23/22 1738)  ondansetron (ZOFRAN) injection 4 mg (4 mg Intravenous Given 09/23/22 1540)  HYDROmorphone (DILAUDID) injection 1 mg (1 mg Intravenous Given 09/23/22 1539)  promethazine (PHENERGAN) 25 mg in sodium chloride 0.9 % 50 mL IVPB (0 mg Intravenous Stopped 09/23/22 2004)  HYDROmorphone (DILAUDID) injection 0.5 mg (0.5 mg Intravenous Given 09/23/22 1841)  iohexol (OMNIPAQUE) 350 MG/ML injection 100 mL (100 mLs Intravenous Contrast Given 09/23/22 1918)  HYDROmorphone (DILAUDID) injection 0.5 mg (0.5 mg Intravenous Given 09/23/22 1953)    Mobility      Focused Assessments    R Recommendations: See Admitting Provider Note  Report given to:   Additional Notes: Call or epic message

## 2022-09-24 ENCOUNTER — Ambulatory Visit (HOSPITAL_COMMUNITY): Payer: Self-pay

## 2022-09-24 ENCOUNTER — Encounter (HOSPITAL_COMMUNITY)
Admission: EM | Disposition: A | Discharge status: Home / Self Care | Payer: Self-pay | Source: Home / Self Care | Attending: Internal Medicine

## 2022-09-24 ENCOUNTER — Inpatient Hospital Stay (HOSPITAL_COMMUNITY): Payer: PRIVATE HEALTH INSURANCE | Admitting: Anesthesiology

## 2022-09-24 ENCOUNTER — Encounter (HOSPITAL_COMMUNITY): Payer: Self-pay | Admitting: Internal Medicine

## 2022-09-24 DIAGNOSIS — R1013 Epigastric pain: Secondary | ICD-10-CM

## 2022-09-24 DIAGNOSIS — K2970 Gastritis, unspecified, without bleeding: Secondary | ICD-10-CM

## 2022-09-24 DIAGNOSIS — Z6841 Body Mass Index (BMI) 40.0 and over, adult: Secondary | ICD-10-CM

## 2022-09-24 DIAGNOSIS — R112 Nausea with vomiting, unspecified: Secondary | ICD-10-CM

## 2022-09-24 DIAGNOSIS — J45909 Unspecified asthma, uncomplicated: Secondary | ICD-10-CM

## 2022-09-24 DIAGNOSIS — R109 Unspecified abdominal pain: Secondary | ICD-10-CM

## 2022-09-24 HISTORY — PX: ESOPHAGOGASTRODUODENOSCOPY: SHX5428

## 2022-09-24 LAB — CBC
HCT: 35.8 % — ABNORMAL LOW (ref 36.0–46.0)
HCT: 34.2 % — ABNORMAL LOW (ref 36.0–46.0)
Hemoglobin: 11.8 g/dL — ABNORMAL LOW (ref 12.0–15.0)
Hemoglobin: 10.8 g/dL — ABNORMAL LOW (ref 12.0–15.0)
MCH: 24.4 pg — ABNORMAL LOW (ref 26.0–34.0)
MCH: 23.4 pg — ABNORMAL LOW (ref 26.0–34.0)
MCHC: 33 g/dL (ref 30.0–36.0)
MCHC: 31.6 g/dL (ref 30.0–36.0)
MCV: 74.1 fL — ABNORMAL LOW (ref 80.0–100.0)
MCV: 74 fL — ABNORMAL LOW (ref 80.0–100.0)
Platelets: 379 10*3/uL (ref 150–400)
Platelets: 350 10*3/uL (ref 150–400)
RBC: 4.83 MIL/uL (ref 3.87–5.11)
RBC: 4.62 MIL/uL (ref 3.87–5.11)
RDW: 16.4 % — ABNORMAL HIGH (ref 11.5–15.5)
RDW: 16.4 % — ABNORMAL HIGH (ref 11.5–15.5)
WBC: 11.2 10*3/uL — ABNORMAL HIGH (ref 4.0–10.5)
WBC: 10.7 10*3/uL — ABNORMAL HIGH (ref 4.0–10.5)
nRBC: 0 % (ref 0.0–0.2)
nRBC: 0 % (ref 0.0–0.2)

## 2022-09-24 LAB — IRON AND TIBC
Iron: 31 ug/dL (ref 28–170)
Saturation Ratios: 8 % — ABNORMAL LOW (ref 10.4–31.8)
TIBC: 412 ug/dL (ref 250–450)
UIBC: 381 ug/dL

## 2022-09-24 LAB — COMPREHENSIVE METABOLIC PANEL
ALT: 14 U/L (ref 0–44)
AST: 15 U/L (ref 15–41)
Albumin: 3.7 g/dL (ref 3.5–5.0)
Alkaline Phosphatase: 77 U/L (ref 38–126)
Anion gap: 11 (ref 5–15)
BUN: <5 mg/dL — ABNORMAL LOW (ref 6–20)
CO2: 21 mmol/L — ABNORMAL LOW (ref 22–32)
Calcium: 9.2 mg/dL (ref 8.9–10.3)
Chloride: 105 mmol/L (ref 98–111)
Creatinine, Ser: 0.85 mg/dL (ref 0.44–1.00)
GFR, Estimated: >60 mL/min (ref >60)
Glucose, Bld: 116 mg/dL — ABNORMAL HIGH (ref 70–99)
Potassium: 3.5 mmol/L (ref 3.5–5.1)
Sodium: 137 mmol/L (ref 135–145)
Total Bilirubin: 0.8 mg/dL (ref 0.3–1.2)
Total Protein: 6.9 g/dL (ref 6.5–8.1)

## 2022-09-24 LAB — VITAMIN B12: Vitamin B-12: 365 pg/mL (ref 180–914)

## 2022-09-24 LAB — HIV ANTIBODY (ROUTINE TESTING W REFLEX): HIV Screen 4th Generation wRfx: NONREACTIVE

## 2022-09-24 LAB — FERRITIN: Ferritin: 22 ng/mL (ref 11–307)

## 2022-09-24 SURGERY — EGD (ESOPHAGOGASTRODUODENOSCOPY)
Anesthesia: Monitor Anesthesia Care | Laterality: Left

## 2022-09-24 MED ORDER — OXYCODONE HCL 5 MG PO TABS: 5.0000 mg | ORAL_TABLET | Freq: Once | ORAL | Status: DC | PRN

## 2022-09-24 MED ORDER — LIDOCAINE 2% (20 MG/ML) 5 ML SYRINGE
INTRAMUSCULAR | Status: DC | PRN
  Administered 2022-09-24: 60 mg via INTRAVENOUS

## 2022-09-24 MED ORDER — OXYCODONE HCL 5 MG/5ML PO SOLN: 5.0000 mg | Freq: Once | ORAL | Status: DC | PRN

## 2022-09-24 MED ORDER — PROPOFOL 10 MG/ML IV BOLUS
INTRAVENOUS | Status: DC | PRN
  Administered 2022-09-24: 60 mg via INTRAVENOUS
  Administered 2022-09-24: 20 mg via INTRAVENOUS

## 2022-09-24 MED ORDER — OXYCODONE HCL 5 MG PO TABS
5.0000 mg | ORAL_TABLET | Freq: Four times a day (QID) | ORAL | Status: DC | PRN
  Administered 2022-09-24 – 2022-09-25 (×3): 5 mg via ORAL
  Filled 2022-09-24 (×3): qty 1

## 2022-09-24 MED ORDER — PANTOPRAZOLE SODIUM 40 MG PO TBEC
40.0000 mg | DELAYED_RELEASE_TABLET | Freq: Every day | ORAL | Status: DC
  Administered 2022-09-24: 40 mg via ORAL
  Filled 2022-09-24: qty 1

## 2022-09-24 MED ORDER — MORPHINE SULFATE (PF) 2 MG/ML IV SOLN
2.0000 mg | INTRAVENOUS | Status: DC | PRN
  Administered 2022-09-24 – 2022-09-25 (×2): 2 mg via INTRAVENOUS
  Filled 2022-09-24 (×2): qty 1
  Filled 2022-09-24: qty 2

## 2022-09-24 MED ORDER — MORPHINE SULFATE (PF) 2 MG/ML IV SOLN
2.0000 mg | INTRAVENOUS | Status: DC | PRN
  Administered 2022-09-24: 2 mg via INTRAVENOUS
  Filled 2022-09-24: qty 1

## 2022-09-24 MED ORDER — PROPOFOL 500 MG/50ML IV EMUL
INTRAVENOUS | Status: DC | PRN
  Administered 2022-09-24: 125 ug/kg/min via INTRAVENOUS

## 2022-09-24 MED ORDER — LACTATED RINGERS IV SOLN
INTRAVENOUS | Status: DC
  Administered 2022-09-24: 1000 mL via INTRAVENOUS

## 2022-09-24 MED ORDER — DIPHENHYDRAMINE HCL 50 MG/ML IJ SOLN
12.5000 mg | INTRAMUSCULAR | Status: DC | PRN
  Administered 2022-09-24 – 2022-09-25 (×5): 12.5 mg via INTRAVENOUS
  Filled 2022-09-24 (×4): qty 1

## 2022-09-24 MED ORDER — ONDANSETRON HCL 4 MG/2ML IJ SOLN: 4.0000 mg | Freq: Four times a day (QID) | INTRAMUSCULAR | Status: DC | PRN

## 2022-09-24 MED ORDER — FENTANYL CITRATE (PF) 100 MCG/2ML IJ SOLN: 25.0000 ug | INTRAMUSCULAR | Status: DC | PRN

## 2022-09-24 MED ORDER — NORETHINDRONE ACETATE 5 MG PO TABS
10.0000 mg | ORAL_TABLET | Freq: Every day | ORAL | Status: DC
  Administered 2022-09-24: 10 mg via ORAL
  Filled 2022-09-24 (×2): qty 2

## 2022-09-24 MED ORDER — SODIUM CHLORIDE 0.9 % IV SOLN: INTRAVENOUS | Status: DC

## 2022-09-24 NOTE — Progress Notes (Signed)
PROGRESS NOTE    [name]  WUJ:[id number] DOB: [birth year] DOA: 09/23/2022 PCP: Patient, No Pcp Per    Brief Narrative:  27 year old with history of lupus, celiac disease, endometriosis, pulmonary embolism, chronic gastritis and major depressive disorder presented with abdominal pain and vomiting of blood.  Chest pain and shortness of breath after started vomiting.  Takes large amount of NSAIDs due to endometriosis pain.  Currently not treating Eliquis as she ran out of it 1 month ago.  Noted to have coffee-ground emesis while in the ER.  Hemoglobin is stable.   Assessment & Plan:   Hematemesis, intractable nausea vomiting with history of gastritis: Also concern for Mallory-Weiss tear. Does have history of chronic gastritis, takes NSAIDs. Hemoglobin has remained slight drift down however at about her baseline.  Will continue to monitor every 12 hours.  Remains on IV Protonix.  Pain management.  Nausea medications.  IV fluids.  GI will be following.  Anticipate upper GI endoscopy.  History of endometriosis: On progesterone. History of lupus: Reports history of SLE.  Previously on Plaquenil. History of PE: Reported 3 episodes in the last few years.  On and off on Eliquis.  CT chest did not show any evidence of PE.  Holding any anticoagulation.  Will not resume anticoagulation. History of depression, on Xanax.   DVT prophylaxis: SCDs Start: 09/23/22 2139   Code Status: Full code Family Communication: None at the bedside Disposition Plan: Status is: Inpatient Remains inpatient appropriate because: Inpatient procedures planned     Consultants:  Gastroenterology  Procedures:  Planned  Antimicrobials:  None   Subjective: Patient seen and examined before going to the procedure.  By the time this note is created, patient underwent upper GI endoscopy with normal findings.  Patient was complaining of being itchy and wanted more Benadryl after taking narcotics.  Complains of  diffuse abdomen pain worse than usual.   Patient has multiple complaints.   Objective: Vitals:   09/23/22 2230 09/23/22 2330 09/24/22 0005 09/24/22 0508  BP: 119/77 125/84 (!) 153/113 130/87  Pulse: (!) 106 98 100 100  Resp: (!) 29 (!) 21 19 19   Temp:   98.1 F (36.7 C) 98.6 F (37 C)  TempSrc:   Oral   SpO2: 98% 94% 96% 96%  Weight:      Height:        Intake/Output Summary (Last 24 hours) at 09/24/2022 0806 Last data filed at 09/24/2022 0645 Gross per 24 hour  Intake 474.64 ml  Output 0 ml  Net 474.64 ml   Filed Weights   09/23/22 1059  Weight: 104.3 kg    Examination:  General exam: Appears calm and comfortable , slightly anxious. Respiratory system: Clear to auscultation. Respiratory effort normal. Cardiovascular system: S1 & S2 heard, RRR. No JVD, murmurs, rubs, gallops or clicks. No pedal edema. Gastrointestinal system: Abdomen is nondistended, soft and nontender. No organomegaly or masses felt. Normal bowel sounds heard. Central nervous system: Alert and oriented. No focal neurological deficits.    Data Reviewed: I have personally reviewed following labs and imaging studies  CBC: Recent Labs  Lab 09/23/22 1101 09/23/22 2249  WBC 7.3 8.6  HGB 11.9* 12.1  HCT 37.0 38.4  MCV 72.8* 74.7*  PLT 378 387   Basic Metabolic Panel: Recent Labs  Lab 09/23/22 1101  NA 138  K 3.5  CL 109  CO2 20*  GLUCOSE 120*  BUN 7  CREATININE 0.85  CALCIUM 9.0   GFR: Estimated Creatinine Clearance: 110.7  mL/min (by C-G formula based on SCr of 0.85 mg/dL). Liver Function Tests: Recent Labs  Lab 09/23/22 1101  AST 15  ALT 13  ALKPHOS 73  BILITOT 0.4  PROT 6.9  ALBUMIN 3.7   Recent Labs  Lab 09/23/22 1101  LIPASE 34   No results for input(s): "AMMONIA" in the last 168 hours. Coagulation Profile: No results for input(s): "INR", "PROTIME" in the last 168 hours. Cardiac Enzymes: No results for input(s): "CKTOTAL", "CKMB", "CKMBINDEX", "TROPONINI" in the last  168 hours. BNP (last 3 results) No results for input(s): "PROBNP" in the last 8760 hours. HbA1C: No results for input(s): "HGBA1C" in the last 72 hours. CBG: No results for input(s): "GLUCAP" in the last 168 hours. Lipid Profile: No results for input(s): "CHOL", "HDL", "LDLCALC", "TRIG", "CHOLHDL", "LDLDIRECT" in the last 72 hours. Thyroid Function Tests: No results for input(s): "TSH", "T4TOTAL", "FREET4", "T3FREE", "THYROIDAB" in the last 72 hours. Anemia Panel: Recent Labs    09/23/22 1122 09/23/22 2249  VITAMINB12  --  365  FOLATE 2.4*  --   FERRITIN  --  22  TIBC  --  412  IRON  --  31  RETICCTPCT  --  1.2   Sepsis Labs: No results for input(s): "PROCALCITON", "LATICACIDVEN" in the last 168 hours.  No results found for this or any previous visit (from the past 240 hour(s)).       Radiology Studies: CT Angio Chest/Abd/Pel for Dissection W and/or Wo Contrast  Result Date: 09/23/2022 CLINICAL DATA:  SHOB, CP, hx of PE, upper GI bleed EXAM: CT ANGIOGRAPHY CHEST, ABDOMEN AND PELVIS TECHNIQUE: Non-contrast CT of the chest was initially obtained. Multidetector CT imaging through the chest, abdomen and pelvis was performed using the standard protocol during bolus administration of intravenous contrast. Multiplanar reconstructed images and MIPs were obtained and reviewed to evaluate the vascular anatomy. RADIATION DOSE REDUCTION: This exam was performed according to the departmental dose-optimization program which includes automated exposure control, adjustment of the mA and/or kV according to patient size and/or use of iterative reconstruction technique. CONTRAST:  OMNIPAQUE IOHEXOL 350 MG/ML SOLN COMPARISON:  CT abdomen pelvis 02/12/2022 FINDINGS: CTA CHEST FINDINGS Cardiovascular: Preferential opacification of the thoracic aorta. No evidence of thoracic aortic aneurysm or dissection. Normal heart size. No significant pericardial effusion. No atherosclerotic plaque of the  thoracic aorta. No coronary artery calcifications. The main pulmonary artery is normal in caliber. Mediastinum/Nodes: No enlarged mediastinal, hilar, or axillary lymph nodes. Thyroid gland, trachea, and esophagus demonstrate no significant findings. Lungs/Pleura: Bilateral lower lobe atelectasis. No focal consolidation. No pulmonary nodule. No pulmonary mass. No pleural effusion. No pneumothorax. Musculoskeletal: No chest wall abnormality. No suspicious lytic or blastic osseous lesions. No acute displaced fracture. Review of the MIP images confirms the above findings. CTA ABDOMEN AND PELVIS FINDINGS VASCULAR No evidence extravasation of intravenous contrast within the gastric lumen on arterial view. Aorta: Normal caliber aorta without aneurysm, dissection, vasculitis or significant stenosis. Celiac: Patent without evidence of aneurysm, dissection, vasculitis or significant stenosis. SMA: Patent without evidence of aneurysm, dissection, vasculitis or significant stenosis. Renals: Both renal arteries are patent without evidence of aneurysm, dissection, vasculitis, fibromuscular dysplasia or significant stenosis. IMA: Patent without evidence of aneurysm, dissection, vasculitis or significant stenosis. Inflow: Patent without evidence of aneurysm, dissection, vasculitis or significant stenosis. Veins: No obvious venous abnormality within the limitations of this arterial phase study. Review of the MIP images confirms the above findings. NON-VASCULAR Hepatobiliary: Pericentimeter fluid density likely represents simple renal cyst. Status post  cholecystectomy. No biliary dilatation. Pancreas: No focal lesion. Normal pancreatic contour. No surrounding inflammatory changes. No main pancreatic ductal dilatation. Spleen: Normal in size without focal abnormality. Adrenals/Urinary Tract: No adrenal nodule bilaterally. Bilateral kidneys enhance symmetrically. No hydronephrosis. No hydroureter. The urinary bladder is unremarkable.  Stomach/Bowel: Stomach is within normal limits. No evidence of bowel wall thickening or dilatation. Status post appendectomy. Lymphatic: No lymphadenopathy. Reproductive: Uterus and bilateral adnexa are unremarkable. Other: No intraperitoneal free fluid. No intraperitoneal free gas. No organized fluid collection. Musculoskeletal: No abdominal wall hernia or abnormality. No suspicious lytic or blastic osseous lesions. No acute displaced fracture. Review of the MIP images confirms the above findings. IMPRESSION: 1. No acute thoracic or abdominal aorta abnormality. 2. No acute intra-abdominal or intrapelvic abnormality. 3. Status post cholecystectomy and appendectomy. Electronically Signed   By: Tish Frederickson M.D.   On: 09/23/2022 19:41        Scheduled Meds:  sodium chloride flush  3 mL Intravenous Q12H   sodium chloride flush  3 mL Intravenous Q12H   Continuous Infusions:  sodium chloride     dextrose 5% lactated ringers 75 mL/hr at 09/24/22 0417   pantoprazole (PROTONIX) IV Stopped (09/24/22 0208)   promethazine (PHENERGAN) injection (IM or IVPB) Stopped (09/24/22 0557)     LOS: 1 day    Time spent: 35 minutes    Dorcas Carrow, MD [facility]

## 2022-09-24 NOTE — Transfer of Care (Signed)
Immediate Anesthesia Transfer of Care Note  Patient: [name]  Procedure(s) Performed: ESOPHAGOGASTRODUODENOSCOPY (EGD) (Left)  Patient Location: PACU  Anesthesia Type:MAC  Level of Consciousness: drowsy and patient cooperative  Airway & Oxygen Therapy: Patient Spontanous Breathing  Post-op Assessment: Report given to RN, Post -op Vital signs reviewed and stable, and Patient moving all extremities X 4  Post vital signs: Reviewed and stable  Last Vitals:  Vitals Value Taken Time  BP 142/90 09/24/22 1120  Temp 37.1 C 09/24/22 1120  Pulse 82 09/24/22 1127  Resp 17 09/24/22 1127  SpO2 97 % 09/24/22 1127  Vitals shown include unfiled device data.  Last Pain:  Vitals:   09/24/22 1120  TempSrc: Temporal  PainSc:       Patients Stated Pain Goal: 0 (09/24/22 0420)  Complications: No notable events documented.

## 2022-09-24 NOTE — Progress Notes (Signed)
Patient requesting to get benadryl one hour early than scheduled time. On call MD,Gardner made aware,per MD it's okay to give benadryl.

## 2022-09-24 NOTE — Progress Notes (Signed)
[name] complains of pain "beyond" 10. Her VS are as follow: BP 144/106; P 89; R 22 Spo2 100% on Room air. She received Oxycodone at 1829 (Q6) which she said she vomited 30 mns after administration. At 2009 (Q4) she received Morphine. She said that did not work. MD on call notified.

## 2022-09-24 NOTE — Progress Notes (Signed)
New Admission Note:   Arrival Method: Arrived from [facility] ED via stretcher Mental Orientation: Alert and oriented x4 Telemetry: Box #8 Assessment: Completed Skin: Intact IV: Rt AC Pain: 8/10 Tubes: N/A Safety Measures: Safety Fall Prevention Plan has been discussed.  Admission: Completed Orientation: Patient has been oriented to the room, unit and staff.  Family: None at bedside  Orders have been reviewed and implemented. Will continue to monitor the patient. Call light has been placed within reach and bed alarm has been activated.   Hawkins Seaman [employer], RN-BC Phone number: [phone]

## 2022-09-24 NOTE — Anesthesia Procedure Notes (Signed)
Procedure Name: MAC Date/Time: 09/24/2022 11:04 AM  Performed by: Aundria Rud, CRNAPre-anesthesia Checklist: Patient identified, Emergency Drugs available, Suction available and Patient being monitored Patient Re-evaluated:Patient Re-evaluated prior to induction Oxygen Delivery Method: Nasal cannula Preoxygenation: Pre-oxygenation with 100% oxygen Induction Type: IV induction Airway Equipment and Method: Oral airway Placement Confirmation: positive ETCO2 Dental Injury: Teeth and Oropharynx as per pre-operative assessment

## 2022-09-24 NOTE — H&P (View-Only) (Signed)
Attending physician's note   I have taken a history, reviewed the chart, and examined the patient. I performed a substantive portion of this encounter, including complete performance of at least one of the key components, in conjunction with the APP. I agree with the APP's note, impression, and recommendations with my edits.   27 year old female with medical history as outlined below, to include history of SLE (has not taken Plaquenil in over a month), endometriosis s/p prior surgery, history of PE (no Eliquis in over a month), reported history of celiac (celiac serologies and genetic testing was previously normal though), depression, ovarian tumor s/p surgery and chemotherapy, admitted with epigastric pain and hematemesis.  Has had similar symptoms and hospital admissions in the past, including hospital admission in [place] for the same issue in July.  Underwent EGD.  Images reviewed on her phone and appears to be distal gastric body/antral gastritis.  Unclear if there were any ulcers, biopsies, or other endoscopic intervention.  Now presents with epigastric pain in the setting of increased NSAID use along with nausea and what was initially "coffee-ground emesis" followed by bright red emesis.  Images reviewed on her phone.  Admission evaluation notable for the following: - H/H 11.8/36 and stable since admission (baseline Hgb ~11) - BUN/creatinine 7/0.85 - Ferritin 22, iron 31, TIBC 412, sat 8% - Folate 2.4 - Normal lipase, hCG, troponin - CTA: Unremarkable for acute intra-abdominal pathology  On chart review, baseline Hgb ~11.  Had EGD in 05/2020 as inpatient for similar symptoms which was normal.  1) Hematemesis 2) Chronic anemia 3) Folate deficiency 4) Epigastric pain 5) Nausea/vomiting - Plan for expedited EGD today for diagnostic and potentially therapeutic intent - Continue high-dose PPI - Continue antiemetics - Start folic acid   Cassandra Emami, DO, FACG [phone]  office           Consultation  Referring Provider:  [facility]  Primary Care Physician:  Patient, No Pcp Per Primary Gastroenterologist:  Dr. Myrtie Neither       Reason for Consultation:     Hematemesis  LOS: 1 day          HPI:   [name] is a 27 y.o. female with past medical history significant for lupus, celiac disease, endometriosis, chronic gastritis, presents for evaluation of hematemesis.  Patient states she had been having abdominal pain associated with her endometriosis over the past few weeks.  Over the last week she has been taking an increased amount of ibuprofen (12 pills/day).  She states 2 days ago she began having nausea and vomiting "coffee ground emesis" then followed by bright red blood.  She has had multiple episodes per day with no trigger.  Continued generalized abdominal pain which she feels may be related to her endometriosis pain.  She also has a history of PE (2022) but ran out of her Eliquis 1 month ago.  She reports late June/early July she had similar symptoms to the above and underwent EGD in [place].  She showed pictures which appeared to be GAVE(?).  And also showed chronic gastritis.  Pertinent workup -Hgb 11.8, stable - MCV 74.1 - Iron 31, TIBC 412, saturation 8% - Ferritin 22 - BUN 7, creatinine 0.85 - Lipase 31 - CTA chest abdomen pelvis showed no acute abnormality.  S/p cholecystectomy and appendectomy.  ED course: Tachycardia with pulse 116.  BP 154/89.  EDP reported 1 episode of coffee-ground emesis with frank vomiting of blood while in ED.  Received Benadryl,  multiple doses of Dilaudid, Protonix, Phenergan.   PREVIOUS GI WORKUP   EGD 06/18/2020 inpatient with Dr. Myrtie Neither for hematemesis - Normal stomach, normal esophagus, normal duodenum.  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 Family History:  Her family history includes Autism in her brother; Breast cancer in an other family member; Cancer in her father, maternal grandmother, and another family member; Diabetes in her father; Hypercholesterolemia in her mother; Hypertension in her father and mother; Rheum arthritis in her mother. Social History:   reports that she has never smoked. She has never used smokeless tobacco. She reports that she does not drink alcohol and does not use drugs.  Prior to Admission medications   Medication Sig Start Date End Date Taking? Authorizing Provider  ALPRAZolam Prudy Feeler) 1 MG tablet Take 1.5 mg by mouth 2 (two) times daily.   Yes [provider]  Goserelin Acetate (ZOLADEX [place]) Inject 1 Device into the skin every 3 (three) months.   Yes [provider]  hydroxychloroquine (PLAQUENIL) 200 MG tablet Take 400 mg by mouth at bedtime.   Yes [provider]  norethindrone (AYGESTIN) 5 MG tablet Take 1 tablet (5 mg total) by mouth daily. Patient taking differently: Take 10 mg by mouth at bedtime. 12/14/21  Yes Henderly, Britni A, PA-C  ondansetron (ZOFRAN-ODT) 4 MG disintegrating tablet Take 1 tablet (4 mg total) by mouth every 8 (eight) hours as needed for nausea or vomiting. 08/27/22  Yes Fondaw, Wylder S, PA  zolpidem (AMBIEN) 10 MG tablet Take 20 mg by mouth at bedtime.   Yes [provider]  apixaban (ELIQUIS) 5 MG TABS tablet Take 1 tablet (5 mg total) by mouth 2 (two) times  daily. Patient not taking: Reported on 09/23/2022 08/25/21   Lonia Blood, MD  pantoprazole (PROTONIX) 20 MG tablet Take 1 tablet (20 mg total) by mouth daily. Patient not taking: Reported on 09/23/2022 08/27/22   Gailen Shelter, PA  dicyclomine (BENTYL) 20 MG tablet Take 1 tablet (20 mg total) by mouth 2 (two) times daily. 11/29/18 04/11/19  Muthersbaugh, Dahlia Client, PA-C  Fluticasone Propionate HFA (FLOVENT HFA IN) Inhale into the lungs. Patient not taking: Reported on 11/14/2019  11/14/19  [provider]    Current Facility-Administered Medications  Medication Dose Route Frequency Provider Last Rate Last Admin   0.9 %  sodium chloride infusion  250 mL Intravenous PRN Janalyn Shy, Subrina, MD       acetaminophen (TYLENOL) tablet 650 mg  650 mg Oral Q6H PRN Janalyn Shy, Subrina, MD       Or   acetaminophen (TYLENOL) suppository 650 mg  650 mg Rectal Q6H PRN Janalyn Shy, Subrina, MD       ALPRAZolam Prudy Feeler) tablet 1.5 mg  1.5 mg Oral BID PRN Janalyn Shy, Subrina, MD   1.5 mg at 09/23/22 2305  dextrose 5 % in lactated ringers infusion   Intravenous Continuous Sundil, Subrina, MD 75 mL/hr at 09/24/22 0417 Restarted at 09/24/22 0417   diphenhydrAMINE (BENADRYL) injection 12.5 mg  12.5 mg Intravenous Q6H PRN Janalyn Shy, Subrina, MD   12.5 mg at 09/24/22 0417   HYDROmorphone (DILAUDID) injection 1 mg  1 mg Intravenous Q4H PRN Janalyn Shy, Subrina, MD   1 mg at 09/24/22 0420   morphine (PF) 2 MG/ML injection 1 mg  1 mg Intravenous Q2H PRN Janalyn Shy, Subrina, MD   1 mg at 09/24/22 0214   ondansetron (ZOFRAN) injection 4 mg  4 mg Intravenous Q6H PRN Janalyn Shy, Subrina, MD   4 mg at 09/23/22 2309   pantoprazole (PROTONIX) 80 mg /NS 100 mL IVPB  80 mg Intravenous Q12H Sundil, Subrina, MD   Stopped at 09/24/22 0208   promethazine (PHENERGAN) 25 mg in sodium chloride 0.9 % 50 mL IVPB  25 mg Intravenous Q6H PRN Janalyn Shy, Subrina, MD   Stopped at 09/24/22 0557   sodium chloride flush (NS) 0.9 % injection 3 mL  3 mL Intravenous Q12H  Sundil, Subrina, MD       sodium chloride flush (NS) 0.9 % injection 3 mL  3 mL Intravenous Q12H Sundil, Subrina, MD       sodium chloride flush (NS) 0.9 % injection 3 mL  3 mL Intravenous PRN Janalyn Shy, Subrina, MD        Allergies as of 09/23/2022 - Review Complete 09/23/2022  Allergen Reaction Noted   Amoxicillin Anaphylaxis, Swelling, and Other (See Comments) 08/05/2015   Azithromycin Anaphylaxis and Other (See Comments) 11/28/2018   Fish-derived products Anaphylaxis and Other (See Comments) 05/18/2022   Peanut-containing drug products Hives, Itching, Rash, and Other (See Comments) 11/28/2018   Gluten meal Itching, Rash, and Other (See Comments) 12/30/2019   Iodinated contrast media Hives and Itching 11/13/2019   Other Hives, Itching, Rash, and Other (See Comments) 10/22/2019   Iodine Itching 11/18/2021   Lactose intolerance (gi) Nausea And Vomiting and Other (See Comments) 05/30/2020    Review of Systems  Constitutional:  Negative for chills, fever and weight loss.  HENT:  Negative for hearing loss and tinnitus.   Eyes:  Negative for blurred vision and double vision.  Respiratory:  Negative for cough and hemoptysis.   Cardiovascular:  Negative for chest pain and palpitations.  Gastrointestinal:  Positive for abdominal pain, nausea and vomiting. Negative for blood in stool, constipation, diarrhea, heartburn and melena.  Genitourinary:  Negative for dysuria and urgency.  Musculoskeletal:  Negative for myalgias and neck pain.  Skin:  Negative for itching and rash.  Neurological:  Negative for seizures and loss of consciousness.  Psychiatric/Behavioral:  Negative for depression and suicidal ideas.        Physical Exam:  Vital signs in last 24 hours: Temp:  [98 F (36.7 C)-98.6 F (37 C)] 98.6 F (37 C) (09/02 0508) Pulse Rate:  [88-116] 100 (09/02 0508) Resp:  [18-29] 19 (09/02 0508) BP: (108-154)/(77-113) 130/87 (09/02 0508) SpO2:  [93 %-98 %] 96 % (09/02 0508) Weight:   [104.3 kg] 104.3 kg (09/01 1059)   Last BM recorded by nurses in past 5 days No data recorded  Physical Exam Constitutional:      Appearance: She is obese.  HENT:     Head: Normocephalic and atraumatic.     Nose: Nose normal. No congestion.     Mouth/Throat:     Mouth: Mucous membranes are dry.  Eyes:     General: No scleral  icterus.    Extraocular Movements: Extraocular movements intact.  Cardiovascular:     Rate and Rhythm: Normal rate and regular rhythm.  Pulmonary:     Effort: Pulmonary effort is normal. No respiratory distress.  Abdominal:     General: Bowel sounds are normal. There is no distension.     Palpations: Abdomen is soft. There is no mass.     Tenderness: There is no abdominal tenderness. There is no guarding or rebound.     Hernia: No hernia is present.  Musculoskeletal:        General: Normal range of motion.     Cervical back: Normal range of motion and neck supple.  Skin:    General: Skin is warm.     Coloration: Skin is not jaundiced.  Neurological:     General: No focal deficit present.     Mental Status: She is alert and oriented to person, place, and time.  Psychiatric:        Mood and Affect: Mood normal.        Behavior: Behavior normal.        Thought Content: Thought content normal.        Judgment: Judgment normal.      LAB RESULTS: Recent Labs    09/23/22 1101 09/23/22 2249  WBC 7.3 8.6  HGB 11.9* 12.1  HCT 37.0 38.4  PLT 378 387   BMET Recent Labs    09/23/22 1101  NA 138  K 3.5  CL 109  CO2 20*  GLUCOSE 120*  BUN 7  CREATININE 0.85  CALCIUM 9.0   LFT Recent Labs    09/23/22 1101  PROT 6.9  ALBUMIN 3.7  AST 15  ALT 13  ALKPHOS 73  BILITOT 0.4   PT/INR No results for input(s): "LABPROT", "INR" in the last 72 hours.  STUDIES: CT Angio Chest/Abd/Pel for Dissection W and/or Wo Contrast  Result Date: 09/23/2022 CLINICAL DATA:  SHOB, CP, hx of PE, upper GI bleed EXAM: CT ANGIOGRAPHY CHEST, ABDOMEN AND PELVIS  TECHNIQUE: Non-contrast CT of the chest was initially obtained. Multidetector CT imaging through the chest, abdomen and pelvis was performed using the standard protocol during bolus administration of intravenous contrast. Multiplanar reconstructed images and MIPs were obtained and reviewed to evaluate the vascular anatomy. RADIATION DOSE REDUCTION: This exam was performed according to the departmental dose-optimization program which includes automated exposure control, adjustment of the mA and/or kV according to patient size and/or use of iterative reconstruction technique. CONTRAST:  OMNIPAQUE IOHEXOL 350 MG/ML SOLN COMPARISON:  CT abdomen pelvis 02/12/2022 FINDINGS: CTA CHEST FINDINGS Cardiovascular: Preferential opacification of the thoracic aorta. No evidence of thoracic aortic aneurysm or dissection. Normal heart size. No significant pericardial effusion. No atherosclerotic plaque of the thoracic aorta. No coronary artery calcifications. The main pulmonary artery is normal in caliber. Mediastinum/Nodes: No enlarged mediastinal, hilar, or axillary lymph nodes. Thyroid gland, trachea, and esophagus demonstrate no significant findings. Lungs/Pleura: Bilateral lower lobe atelectasis. No focal consolidation. No pulmonary nodule. No pulmonary mass. No pleural effusion. No pneumothorax. Musculoskeletal: No chest wall abnormality. No suspicious lytic or blastic osseous lesions. No acute displaced fracture. Review of the MIP images confirms the above findings. CTA ABDOMEN AND PELVIS FINDINGS VASCULAR No evidence extravasation of intravenous contrast within the gastric lumen on arterial view. Aorta: Normal caliber aorta without aneurysm, dissection, vasculitis or significant stenosis. Celiac: Patent without evidence of aneurysm, dissection, vasculitis or significant stenosis. SMA: Patent without evidence of aneurysm, dissection, vasculitis or significant stenosis.  Renals: Both renal arteries are patent without  evidence of aneurysm, dissection, vasculitis, fibromuscular dysplasia or significant stenosis. IMA: Patent without evidence of aneurysm, dissection, vasculitis or significant stenosis. Inflow: Patent without evidence of aneurysm, dissection, vasculitis or significant stenosis. Veins: No obvious venous abnormality within the limitations of this arterial phase study. Review of the MIP images confirms the above findings. NON-VASCULAR Hepatobiliary: Pericentimeter fluid density likely represents simple renal cyst. Status post cholecystectomy. No biliary dilatation. Pancreas: No focal lesion. Normal pancreatic contour. No surrounding inflammatory changes. No main pancreatic ductal dilatation. Spleen: Normal in size without focal abnormality. Adrenals/Urinary Tract: No adrenal nodule bilaterally. Bilateral kidneys enhance symmetrically. No hydronephrosis. No hydroureter. The urinary bladder is unremarkable. Stomach/Bowel: Stomach is within normal limits. No evidence of bowel wall thickening or dilatation. Status post appendectomy. Lymphatic: No lymphadenopathy. Reproductive: Uterus and bilateral adnexa are unremarkable. Other: No intraperitoneal free fluid. No intraperitoneal free gas. No organized fluid collection. Musculoskeletal: No abdominal wall hernia or abnormality. No suspicious lytic or blastic osseous lesions. No acute displaced fracture. Review of the MIP images confirms the above findings. IMPRESSION: 1. No acute thoracic or abdominal aorta abnormality. 2. No acute intra-abdominal or intrapelvic abnormality. 3. Status post cholecystectomy and appendectomy. Electronically Signed   By: Tish Frederickson M.D.   On: 09/23/2022 19:41      Impression    27 year old female presents with coffee-ground emesis with frank blood x 2 days with high-dose NSAID use (12 pills/day) over the last week.  Recent EGD in [place] early July showing gastritis/GAVE (?).  Hemodynamically stable.  Hematemesis -Hgb 11.8, stable -  MCV 74.1 - Iron 31, TIBC 412, saturation 8% - Ferritin 22 - BUN 7, creatinine 0.85 - Lipase 31 - CTA chest abdomen pelvis showed no acute abnormality.  S/p cholecystectomy and appendectomy DDx includes gastritis, esophagitis, Mallory-Weiss tear secondary to retching, PUD.  Hemodynamically stable    Plan   Plan for EGD today I thoroughly discussed the procedures to include nature, alternatives, benefits, and risks including but not limited to bleeding, perforation, infection, anesthesia/cardiac and pulmonary complications. Patient provides understanding and gave verbal consent to proceed. Continue Protonix IV 40mg  BID Continue anti-emetics and supportive care as needed.  Continue daily CBC and transfuse as needed to maintain HGB > 7   Thank you for your kind consultation, we will continue to follow.   [name]  09/24/2022, 8:17 AM

## 2022-09-24 NOTE — Consult Note (Addendum)
Attending physician's note   I have taken a history, reviewed the chart, and examined the patient. I performed a substantive portion of this encounter, including complete performance of at least one of the key components, in conjunction with the APP. I agree with the APP's note, impression, and recommendations with my edits.   27 year old female with medical history as outlined below, to include history of SLE (has not taken Plaquenil in over a month), endometriosis s/p prior surgery, history of PE (no Eliquis in over a month), reported history of celiac (celiac serologies and genetic testing was previously normal though), depression, ovarian tumor s/p surgery and chemotherapy, admitted with epigastric pain and hematemesis.  Has had similar symptoms and hospital admissions in the past, including hospital admission in [place] for the same issue in July.  Underwent EGD.  Images reviewed on her phone and appears to be distal gastric body/antral gastritis.  Unclear if there were any ulcers, biopsies, or other endoscopic intervention.  Now presents with epigastric pain in the setting of increased NSAID use along with nausea and what was initially "coffee-ground emesis" followed by bright red emesis.  Images reviewed on her phone.  Admission evaluation notable for the following: - H/H 11.8/36 and stable since admission (baseline Hgb ~11) - BUN/creatinine 7/0.85 - Ferritin 22, iron 31, TIBC 412, sat 8% - Folate 2.4 - Normal lipase, hCG, troponin - CTA: Unremarkable for acute intra-abdominal pathology  On chart review, baseline Hgb ~11.  Had EGD in 05/2020 as inpatient for similar symptoms which was normal.  1) Hematemesis 2) Chronic anemia 3) Folate deficiency 4) Epigastric pain 5) Nausea/vomiting - Plan for expedited EGD today for diagnostic and potentially therapeutic intent - Continue high-dose PPI - Continue antiemetics - Start folic acid   Nikita Surman, DO, FACG [phone]  office           Consultation  Referring Provider:  [facility]  Primary Care Physician:  Patient, No Pcp Per Primary Gastroenterologist:  [facility] (only seen by [facility] as inpatient consultation)   Reason for Consultation:     Hematemesis  LOS: 1 day          HPI:   [name] is a 27 y.o. female with past medical history significant for lupus, celiac disease, endometriosis, chronic gastritis, presents for evaluation of hematemesis.  Patient states she had been having abdominal pain associated with her endometriosis over the past few weeks.  Over the last week she has been taking an increased amount of ibuprofen (12 pills/day).  She states 2 days ago she began having nausea and vomiting "coffee ground emesis" then followed by bright red blood.  She has had multiple episodes per day with no trigger.  Continued generalized abdominal pain which she feels may be related to her endometriosis pain.  She also has a history of PE (2022) but ran out of her Eliquis 1 month ago.  She reports late June/early July she had similar symptoms to the above and underwent EGD in [place].  She showed pictures which appeared to be GAVE(?).  And also showed chronic gastritis.  Pertinent workup -Hgb 11.8, stable - MCV 74.1 - Iron 31, TIBC 412, saturation 8% - Ferritin 22 - BUN 7, creatinine 0.85 - Lipase 31 - CTA chest abdomen pelvis showed no acute abnormality.  S/p cholecystectomy and appendectomy.  ED course: Tachycardia with pulse 116.  BP 154/89.  EDP reported 1 episode of coffee-ground emesis with frank vomiting of blood while  in ED.  Received Benadryl, multiple doses of Dilaudid, Protonix, Phenergan.   PREVIOUS GI WORKUP   EGD 06/18/2020 inpatient with Dr. Myrtie Neither for hematemesis - Normal stomach, normal esophagus, normal duodenum.  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 Family History:  Her family history includes Autism in her brother; Breast cancer in an other family member; Cancer in her father, maternal grandmother, and another family member; Diabetes in her father; Hypercholesterolemia in her mother; Hypertension in her father and mother; Rheum arthritis in her mother. Social History:   reports that she has never smoked. She has never used smokeless tobacco. She reports that she does not drink alcohol and does not use drugs.  Prior to Admission medications   Medication Sig Start Date End Date Taking? Authorizing Provider  ALPRAZolam Prudy Feeler) 1 MG tablet Take 1.5 mg by mouth 2 (two) times daily.   Yes [provider]  Goserelin Acetate (ZOLADEX [place]) Inject 1 Device into the skin every 3 (three) months.   Yes [provider]  hydroxychloroquine (PLAQUENIL) 200 MG tablet Take 400 mg by mouth at bedtime.   Yes [provider]  norethindrone (AYGESTIN) 5 MG tablet Take 1 tablet (5 mg total) by mouth daily. Patient taking differently: Take 10 mg by mouth at bedtime. 12/14/21  Yes Henderly, Britni A, PA-C  ondansetron (ZOFRAN-ODT) 4 MG disintegrating tablet Take 1 tablet (4 mg total) by mouth every 8 (eight) hours as needed for nausea or vomiting. 08/27/22  Yes Fondaw, Wylder S, PA  zolpidem (AMBIEN) 10 MG tablet Take 20 mg by mouth at bedtime.   Yes [provider]  apixaban (ELIQUIS) 5 MG TABS  tablet Take 1 tablet (5 mg total) by mouth 2 (two) times daily. Patient not taking: Reported on 09/23/2022 08/25/21   Lonia Blood, MD  pantoprazole (PROTONIX) 20 MG tablet Take 1 tablet (20 mg total) by mouth daily. Patient not taking: Reported on 09/23/2022 08/27/22   Gailen Shelter, PA  dicyclomine (BENTYL) 20 MG tablet Take 1 tablet (20 mg total) by mouth 2 (two) times daily. 11/29/18 04/11/19  Muthersbaugh, Dahlia Client, PA-C  Fluticasone Propionate HFA (FLOVENT HFA IN) Inhale into the lungs. Patient not taking: Reported on 11/14/2019  11/14/19  [provider]    Current Facility-Administered Medications  Medication Dose Route Frequency Provider Last Rate Last Admin   0.9 %  sodium chloride infusion  250 mL Intravenous PRN Janalyn Shy, Subrina, MD       acetaminophen (TYLENOL) tablet 650 mg  650 mg Oral Q6H PRN Janalyn Shy, Subrina, MD       Or   acetaminophen (TYLENOL) suppository 650 mg  650 mg Rectal Q6H PRN Janalyn Shy, Subrina, MD       ALPRAZolam Prudy Feeler) tablet 1.5 mg  1.5 mg Oral BID PRN Janalyn Shy, Subrina, MD   1.5 mg  at 09/23/22 2305   dextrose 5 % in lactated ringers infusion   Intravenous Continuous Sundil, Subrina, MD 75 mL/hr at 09/24/22 0417 Restarted at 09/24/22 0417   diphenhydrAMINE (BENADRYL) injection 12.5 mg  12.5 mg Intravenous Q6H PRN Janalyn Shy, Subrina, MD   12.5 mg at 09/24/22 0417   HYDROmorphone (DILAUDID) injection 1 mg  1 mg Intravenous Q4H PRN Janalyn Shy, Subrina, MD   1 mg at 09/24/22 0420   morphine (PF) 2 MG/ML injection 1 mg  1 mg Intravenous Q2H PRN Janalyn Shy, Subrina, MD   1 mg at 09/24/22 0214   ondansetron (ZOFRAN) injection 4 mg  4 mg Intravenous Q6H PRN Janalyn Shy, Subrina, MD   4 mg at 09/23/22 2309   pantoprazole (PROTONIX) 80 mg /NS 100 mL IVPB  80 mg Intravenous Q12H Sundil, Subrina, MD   Stopped at 09/24/22 0208   promethazine (PHENERGAN) 25 mg in sodium chloride 0.9 % 50 mL IVPB  25 mg Intravenous Q6H PRN Janalyn Shy, Subrina, MD   Stopped at 09/24/22 0557   sodium chloride  flush (NS) 0.9 % injection 3 mL  3 mL Intravenous Q12H Sundil, Subrina, MD       sodium chloride flush (NS) 0.9 % injection 3 mL  3 mL Intravenous Q12H Sundil, Subrina, MD       sodium chloride flush (NS) 0.9 % injection 3 mL  3 mL Intravenous PRN Janalyn Shy, Subrina, MD        Allergies as of 09/23/2022 - Review Complete 09/23/2022  Allergen Reaction Noted   Amoxicillin Anaphylaxis, Swelling, and Other (See Comments) 08/05/2015   Azithromycin Anaphylaxis and Other (See Comments) 11/28/2018   Fish-derived products Anaphylaxis and Other (See Comments) 05/18/2022   Peanut-containing drug products Hives, Itching, Rash, and Other (See Comments) 11/28/2018   Gluten meal Itching, Rash, and Other (See Comments) 12/30/2019   Iodinated contrast media Hives and Itching 11/13/2019   Other Hives, Itching, Rash, and Other (See Comments) 10/22/2019   Iodine Itching 11/18/2021   Lactose intolerance (gi) Nausea And Vomiting and Other (See Comments) 05/30/2020    Review of Systems  Constitutional:  Negative for chills, fever and weight loss.  HENT:  Negative for hearing loss and tinnitus.   Eyes:  Negative for blurred vision and double vision.  Respiratory:  Negative for cough and hemoptysis.   Cardiovascular:  Negative for chest pain and palpitations.  Gastrointestinal:  Positive for abdominal pain, nausea and vomiting. Negative for blood in stool, constipation, diarrhea, heartburn and melena.  Genitourinary:  Negative for dysuria and urgency.  Musculoskeletal:  Negative for myalgias and neck pain.  Skin:  Negative for itching and rash.  Neurological:  Negative for seizures and loss of consciousness.  Psychiatric/Behavioral:  Negative for depression and suicidal ideas.        Physical Exam:  Vital signs in last 24 hours: Temp:  [98 F (36.7 C)-98.6 F (37 C)] 98.6 F (37 C) (09/02 0508) Pulse Rate:  [88-116] 100 (09/02 0508) Resp:  [18-29] 19 (09/02 0508) BP: (108-154)/(77-113) 130/87 (09/02  0508) SpO2:  [93 %-98 %] 96 % (09/02 0508) Weight:  [104.3 kg] 104.3 kg (09/01 1059)   Last BM recorded by nurses in past 5 days No data recorded  Physical Exam Constitutional:      Appearance: She is obese.  HENT:     Head: Normocephalic and atraumatic.     Nose: Nose normal. No congestion.     Mouth/Throat:     Mouth: Mucous membranes are dry.  Eyes:  General: No scleral icterus.    Extraocular Movements: Extraocular movements intact.  Cardiovascular:     Rate and Rhythm: Normal rate and regular rhythm.  Pulmonary:     Effort: Pulmonary effort is normal. No respiratory distress.  Abdominal:     General: Bowel sounds are normal. There is no distension.     Palpations: Abdomen is soft. There is no mass.     Tenderness: There is no abdominal tenderness. There is no guarding or rebound.     Hernia: No hernia is present.  Musculoskeletal:        General: Normal range of motion.     Cervical back: Normal range of motion and neck supple.  Skin:    General: Skin is warm.     Coloration: Skin is not jaundiced.  Neurological:     General: No focal deficit present.     Mental Status: She is alert and oriented to person, place, and time.  Psychiatric:        Mood and Affect: Mood normal.        Behavior: Behavior normal.        Thought Content: Thought content normal.        Judgment: Judgment normal.      LAB RESULTS: Recent Labs    09/23/22 1101 09/23/22 2249  WBC 7.3 8.6  HGB 11.9* 12.1  HCT 37.0 38.4  PLT 378 387   BMET Recent Labs    09/23/22 1101  NA 138  K 3.5  CL 109  CO2 20*  GLUCOSE 120*  BUN 7  CREATININE 0.85  CALCIUM 9.0   LFT Recent Labs    09/23/22 1101  PROT 6.9  ALBUMIN 3.7  AST 15  ALT 13  ALKPHOS 73  BILITOT 0.4   PT/INR No results for input(s): "LABPROT", "INR" in the last 72 hours.  STUDIES: CT Angio Chest/Abd/Pel for Dissection W and/or Wo Contrast  Result Date: 09/23/2022 CLINICAL DATA:  SHOB, CP, hx of PE, upper GI  bleed EXAM: CT ANGIOGRAPHY CHEST, ABDOMEN AND PELVIS TECHNIQUE: Non-contrast CT of the chest was initially obtained. Multidetector CT imaging through the chest, abdomen and pelvis was performed using the standard protocol during bolus administration of intravenous contrast. Multiplanar reconstructed images and MIPs were obtained and reviewed to evaluate the vascular anatomy. RADIATION DOSE REDUCTION: This exam was performed according to the departmental dose-optimization program which includes automated exposure control, adjustment of the mA and/or kV according to patient size and/or use of iterative reconstruction technique. CONTRAST:  OMNIPAQUE IOHEXOL 350 MG/ML SOLN COMPARISON:  CT abdomen pelvis 02/12/2022 FINDINGS: CTA CHEST FINDINGS Cardiovascular: Preferential opacification of the thoracic aorta. No evidence of thoracic aortic aneurysm or dissection. Normal heart size. No significant pericardial effusion. No atherosclerotic plaque of the thoracic aorta. No coronary artery calcifications. The main pulmonary artery is normal in caliber. Mediastinum/Nodes: No enlarged mediastinal, hilar, or axillary lymph nodes. Thyroid gland, trachea, and esophagus demonstrate no significant findings. Lungs/Pleura: Bilateral lower lobe atelectasis. No focal consolidation. No pulmonary nodule. No pulmonary mass. No pleural effusion. No pneumothorax. Musculoskeletal: No chest wall abnormality. No suspicious lytic or blastic osseous lesions. No acute displaced fracture. Review of the MIP images confirms the above findings. CTA ABDOMEN AND PELVIS FINDINGS VASCULAR No evidence extravasation of intravenous contrast within the gastric lumen on arterial view. Aorta: Normal caliber aorta without aneurysm, dissection, vasculitis or significant stenosis. Celiac: Patent without evidence of aneurysm, dissection, vasculitis or significant stenosis. SMA: Patent without evidence of aneurysm, dissection, vasculitis  or significant  stenosis. Renals: Both renal arteries are patent without evidence of aneurysm, dissection, vasculitis, fibromuscular dysplasia or significant stenosis. IMA: Patent without evidence of aneurysm, dissection, vasculitis or significant stenosis. Inflow: Patent without evidence of aneurysm, dissection, vasculitis or significant stenosis. Veins: No obvious venous abnormality within the limitations of this arterial phase study. Review of the MIP images confirms the above findings. NON-VASCULAR Hepatobiliary: Pericentimeter fluid density likely represents simple renal cyst. Status post cholecystectomy. No biliary dilatation. Pancreas: No focal lesion. Normal pancreatic contour. No surrounding inflammatory changes. No main pancreatic ductal dilatation. Spleen: Normal in size without focal abnormality. Adrenals/Urinary Tract: No adrenal nodule bilaterally. Bilateral kidneys enhance symmetrically. No hydronephrosis. No hydroureter. The urinary bladder is unremarkable. Stomach/Bowel: Stomach is within normal limits. No evidence of bowel wall thickening or dilatation. Status post appendectomy. Lymphatic: No lymphadenopathy. Reproductive: Uterus and bilateral adnexa are unremarkable. Other: No intraperitoneal free fluid. No intraperitoneal free gas. No organized fluid collection. Musculoskeletal: No abdominal wall hernia or abnormality. No suspicious lytic or blastic osseous lesions. No acute displaced fracture. Review of the MIP images confirms the above findings. IMPRESSION: 1. No acute thoracic or abdominal aorta abnormality. 2. No acute intra-abdominal or intrapelvic abnormality. 3. Status post cholecystectomy and appendectomy. Electronically Signed   By: Tish Frederickson M.D.   On: 09/23/2022 19:41      Impression    27 year old female presents with coffee-ground emesis with frank blood x 2 days with high-dose NSAID use (12 pills/day) over the last week.  Recent EGD in [place] early July showing gastritis/GAVE (?).   Hemodynamically stable.  Hematemesis -Hgb 11.8, stable - MCV 74.1 - Iron 31, TIBC 412, saturation 8% - Ferritin 22 - BUN 7, creatinine 0.85 - Lipase 31 - CTA chest abdomen pelvis showed no acute abnormality.  S/p cholecystectomy and appendectomy DDx includes gastritis, esophagitis, Mallory-Weiss tear secondary to retching, PUD.  Hemodynamically stable    Plan   Plan for EGD today I thoroughly discussed the procedures to include nature, alternatives, benefits, and risks including but not limited to bleeding, perforation, infection, anesthesia/cardiac and pulmonary complications. Patient provides understanding and gave verbal consent to proceed. Continue Protonix IV 40mg  BID Continue anti-emetics and supportive care as needed.  Continue daily CBC and transfuse as needed to maintain HGB > 7   Thank you for your kind consultation, we will continue to follow.   Bayley Leanna Sato  09/24/2022, 8:17 AM

## 2022-09-24 NOTE — Significant Event (Addendum)
Will up morphine dose to 2-4mg  PRN Q4H and have RN give another 2mg  now.  Pt with very high narcotic requirements on admission yesterday as well for her abd pain.  Fairly extensive history of ED visits + admissions for chronic abdominal pain.  Looks like this will be her 10th so far in 2024 based on care everywhere and our records.

## 2022-09-24 NOTE — Anesthesia Preprocedure Evaluation (Signed)
Anesthesia Evaluation  Patient identified by MRN, date of birth, ID band Patient awake    Reviewed: Allergy & Precautions, H&P , NPO status , Patient's Chart, lab work & pertinent test results  Airway Mallampati: II   Neck ROM: full    Dental   Pulmonary asthma    breath sounds clear to auscultation       Cardiovascular negative cardio ROS  Rhythm:regular Rate:Normal     Neuro/Psych  Headaches, Seizures -,  PSYCHIATRIC DISORDERS Anxiety Depression Bipolar Disorder      GI/Hepatic Ulcerative colitis  GI bleeding, hematemesis   Endo/Other    Morbid obesity  Renal/GU      Musculoskeletal   Abdominal   Peds  Hematology   Anesthesia Other Findings   Reproductive/Obstetrics                             Anesthesia Physical Anesthesia Plan  ASA: 3  Anesthesia Plan: MAC   Post-op Pain Management:    Induction: Intravenous  PONV Risk Score and Plan: 2 and Propofol infusion and Treatment may vary due to age or medical condition  Airway Management Planned: Nasal Cannula  Additional Equipment:   Intra-op Plan:   Post-operative Plan:   Informed Consent: I have reviewed the patients History and Physical, chart, labs and discussed the procedure including the risks, benefits and alternatives for the proposed anesthesia with the patient or authorized representative who has indicated his/her understanding and acceptance.     Dental advisory given  Plan Discussed with: CRNA, Anesthesiologist and Surgeon  Anesthesia Plan Comments:        Anesthesia Quick Evaluation

## 2022-09-24 NOTE — Interval H&P Note (Signed)
History and Physical Interval Note:  09/24/2022 10:48 AM  [name]  has presented today for surgery, with the diagnosis of Hematemesis, epigastric pain, nausea and vomiting.  The various methods of treatment have been discussed with the patient and family. After consideration of risks, benefits and other options for treatment, the patient has consented to  Procedure(s): ESOPHAGOGASTRODUODENOSCOPY (EGD) (Left) as a surgical intervention.  The patient's history has been reviewed, patient examined, no change in status, stable for surgery.  I have reviewed the patient's chart and labs.  Questions were answered to the patient's satisfaction.     Verlin Dike Alyssha Housh

## 2022-09-24 NOTE — Plan of Care (Signed)
  Problem: Education: Goal: Knowledge of General Education information will improve Description: Including pain rating scale, medication(s)/side effects and non-pharmacologic comfort measures Outcome: Progressing   Problem: Activity: Goal: Risk for activity intolerance will decrease Outcome: Progressing   

## 2022-09-24 NOTE — Op Note (Signed)
[facility] Patient Name: [name] Procedure Date : 09/24/2022 MRN: [id number] Attending MD: Doristine Locks , MD, [id number] Date of Birth: February 22, 1995 CSN: [id number] Age: 27 Admit Type: Inpatient Procedure:                Upper GI endoscopy Indications:              Epigastric abdominal pain, Hematemesis, Nausea with                            vomiting Providers:                Doristine Locks, MD, Doristine Mango, RN, Harrington Challenger, Technician Referring MD:              Medicines:                Monitored Anesthesia Care Complications:            No immediate complications. Estimated Blood Loss:     Estimated blood loss: none. Procedure:                Pre-Anesthesia Assessment:                           - Prior to the procedure, a History and Physical                            was performed, and patient medications and                            allergies were reviewed. The patient's tolerance of                            previous anesthesia was also reviewed. The risks                            and benefits of the procedure and the sedation                            options and risks were discussed with the patient.                            All questions were answered, and informed consent                            was obtained. Prior Anticoagulants: The patient has                            taken no anticoagulant or antiplatelet agents. ASA                            Grade Assessment: III - A patient with severe                            systemic  disease. After reviewing the risks and                            benefits, the patient was deemed in satisfactory                            condition to undergo the procedure.                           After obtaining informed consent, the endoscope was                            passed under direct vision. Throughout the                            procedure, the patient's blood  pressure, pulse, and                            oxygen saturations were monitored continuously. The                            [id number]-[id number] ([id number]) Olympus endoscope was introduced                            through the mouth, and advanced to the third part                            of duodenum. The upper GI endoscopy was                            accomplished without difficulty. The patient                            tolerated the procedure well. Scope In: Scope Out: Findings:      The examined esophagus was normal.      The Z-line was regular and was found 37 cm from the incisors.      The entire examined stomach was normal.      The examined duodenum was normal. Impression:               - Normal esophagus.                           - Z-line regular, 37 cm from the incisors.                           - Normal stomach.                           - Normal examined duodenum.                           - No specimens collected. Recommendation:           - Return patient to hospital ward for ongoing care.                           -  Advance diet as tolerated.                           - Continue present medications.                           - Inpatient GI service will sign off at this time.                            She can follow-up with her outpatient GI after                            discharge. Procedure Code(s):        --- Professional ---                           [phone], Esophagogastroduodenoscopy, flexible,                            transoral; diagnostic, including collection of                            specimen(s) by brushing or washing, when performed                            (separate procedure) Diagnosis Code(s):        --- Professional ---                           R10.13, Epigastric pain                           K92.0, Hematemesis                           R11.2, Nausea with vomiting, unspecified CPT copyright 2022 [employer]. All rights  reserved. The codes documented in this report are preliminary and upon coder review may  be revised to meet current compliance requirements. Doristine Locks, MD 09/24/2022 11:31:40 AM Number of Addenda: 0

## 2022-09-25 ENCOUNTER — Encounter (HOSPITAL_COMMUNITY): Payer: Self-pay | Admitting: Gastroenterology

## 2022-09-25 LAB — CBC
HCT: 33.4 % — ABNORMAL LOW (ref 36.0–46.0)
Hemoglobin: 10.7 g/dL — ABNORMAL LOW (ref 12.0–15.0)
MCH: 23.6 pg — ABNORMAL LOW (ref 26.0–34.0)
MCHC: 32 g/dL (ref 30.0–36.0)
MCV: 73.6 fL — ABNORMAL LOW (ref 80.0–100.0)
Platelets: 337 10*3/uL (ref 150–400)
RBC: 4.54 MIL/uL (ref 3.87–5.11)
RDW: 16.6 % — ABNORMAL HIGH (ref 11.5–15.5)
WBC: 10 10*3/uL (ref 4.0–10.5)
nRBC: 0 % (ref 0.0–0.2)

## 2022-09-25 LAB — COMPREHENSIVE METABOLIC PANEL
ALT: 11 U/L (ref 0–44)
AST: 12 U/L — ABNORMAL LOW (ref 15–41)
Albumin: 3.3 g/dL — ABNORMAL LOW (ref 3.5–5.0)
Alkaline Phosphatase: 68 U/L (ref 38–126)
Anion gap: 13 (ref 5–15)
BUN: 6 mg/dL (ref 6–20)
CO2: 20 mmol/L — ABNORMAL LOW (ref 22–32)
Calcium: 8.7 mg/dL — ABNORMAL LOW (ref 8.9–10.3)
Chloride: 105 mmol/L (ref 98–111)
Creatinine, Ser: 0.9 mg/dL (ref 0.44–1.00)
GFR, Estimated: >60 mL/min (ref >60)
Glucose, Bld: 82 mg/dL (ref 70–99)
Potassium: 3.3 mmol/L — ABNORMAL LOW (ref 3.5–5.1)
Sodium: 138 mmol/L (ref 135–145)
Total Bilirubin: 0.4 mg/dL (ref 0.3–1.2)
Total Protein: 6 g/dL — ABNORMAL LOW (ref 6.5–8.1)

## 2022-09-25 MED ORDER — OXYCODONE HCL 5 MG PO TABS: 5.0000 mg | ORAL_TABLET | Freq: Four times a day (QID) | ORAL | 0 refills | Status: DC | PRN

## 2022-09-25 MED ORDER — PROMETHAZINE HCL 25 MG PO TABS: 25.0000 mg | ORAL_TABLET | Freq: Four times a day (QID) | ORAL | 0 refills | Status: DC | PRN

## 2022-09-25 MED ORDER — NORETHINDRONE ACETATE 5 MG PO TABS: 10.0000 mg | ORAL_TABLET | Freq: Every day | ORAL | 0 refills | Status: DC

## 2022-09-25 MED ORDER — PANTOPRAZOLE SODIUM 20 MG PO TBEC: 20.0000 mg | DELAYED_RELEASE_TABLET | Freq: Every day | ORAL | 0 refills | Status: DC

## 2022-09-25 NOTE — Discharge Summary (Signed)
Physician Discharge Summary  [name] WUJ:[id number] DOB: 04-21-1995 DOA: 09/23/2022  PCP: Patient, No Pcp Per  Admit date: 09/23/2022 Discharge date: 09/25/2022  Admitted From: Home Disposition: Home  Recommendations for Outpatient Follow-up:  Follow up with PCP in 1-2 weeks Please obtain BMP/CBC in one week Follow-up with your gynecologist.  Also ask referral to pain management.  Discharge Condition: Stable CODE STATUS: Full code Diet recommendation: Regular diet,  Discharge summary: 27 year old with history of lupus, celiac disease, endometriosis, history of PE, chronic gastritis, major depressive disorder and insomnia on chronic benzodiazepines and Ambien presented with abdominal pain and vomiting followed by coffee-ground emesis as well streaks of blood.  Currently not on Eliquis but taking NSAIDs.  In the emergency room CT angiogram was negative for pulmonary embolism or dissection.  No acute findings.  Hemoglobin remained stable.  Due to demonstrated streaks of blood with vomiting and abdominal pain she was admitted to hospital.  Treated symptomatically.  Underwent upper GI endoscopy that was normal.  Hemoglobin remained stable.  Discharging home with PPI.  Intractable nausea and abdominal pain: Likely due to exacerbation of her endometriosis.  There was reported hematemesis but no evidence of upper GI bleeding on endoscopy.  Stabilized.  Tolerating soft diet. Advised to continue Protonix. Has significant abdominal pain secondary to endometriosis, prescribed short course of narcotics for pain relief. Prescribed nausea medications. She is also taking progesterone for endometriosis and this was refilled.  She will need primary care physician for further medication refills. Stable for discharge.   Discharge Diagnoses:  Principal Problem:   Hematemesis Active Problems:   Intractable abdominal pain   History of gastritis   Concern for Mallory-Weiss tear   Intractable nausea  and vomiting   Chronic anemia   Endometriosis   SLE (systemic lupus erythematosus) (HCC)   History of pulmonary embolus (PE)   MDD (major depressive disorder)   Metabolic acidosis   Hematemesis with nausea   Abdominal pain, epigastric   Nausea and vomiting    Discharge Instructions  Discharge Instructions     Call MD for:  severe uncontrolled pain   Complete by: As directed    Diet - low sodium heart healthy   Complete by: As directed    Increase activity slowly   Complete by: As directed       Allergies as of 09/25/2022       Reactions   Amoxicillin Anaphylaxis, Swelling, Other (See Comments)   Hand swelling (PENFAST 4+) Tolerated ceftriaxone 2021 PenFast Score 4+:  High risk of positive penicillin allergy (50%) Oral challenge NOT recommended  Cephalosporins may be considered under close monitoring   Azithromycin Anaphylaxis, Other (See Comments)   Fish-derived Products Anaphylaxis, Other (See Comments)   Eats shellfish alright   Peanut-containing Drug Products Hives, Itching, Rash, Other (See Comments)      Gluten Meal Itching, Rash, Other (See Comments)   Celiac disease   Iodinated Contrast Media Hives, Itching   02/04/2020 Patient stated has history of itching and hives with CT IV Iodine contrast, premedicated with Benadryl 25 mg IV prior to CT IV Iodine scan today.Post CT scan patient reported itching, Benadryl 25 mg IV given, symptoms resolved, NP recommends for patient to get premedicated with Benadryl 50 mg IV prior to future CT IV Iodine scans.    Other Hives, Itching, Rash, Other (See Comments)   Moderna covid vaccine -Syncope - ICU 2 weeks Tree nuts- Hives, itching, rashes   Iodine Itching   Lactose Intolerance (gi) Nausea  And Vomiting, Other (See Comments)   Can tolerate, in small doses        Medication List     STOP taking these medications    apixaban 5 MG Tabs tablet Commonly known as: ELIQUIS   ondansetron 4 MG disintegrating  tablet Commonly known as: ZOFRAN-ODT       TAKE these medications    ALPRAZolam 1 MG tablet Commonly known as: XANAX Take 1.5 mg by mouth 2 (two) times daily.   norethindrone 5 MG tablet Commonly known as: AYGESTIN Take 2 tablets (10 mg total) by mouth at bedtime.   oxyCODONE 5 MG immediate release tablet Commonly known as: Oxy IR/ROXICODONE Take 1 tablet (5 mg total) by mouth every 6 (six) hours as needed for moderate pain.   pantoprazole 20 MG tablet Commonly known as: PROTONIX Take 1 tablet (20 mg total) by mouth daily.   Plaquenil 200 MG tablet Generic drug: hydroxychloroquine Take 400 mg by mouth at bedtime.   promethazine 25 MG tablet Commonly known as: PHENERGAN Take 1 tablet (25 mg total) by mouth every 6 (six) hours as needed for nausea or vomiting.   ZOLADEX [place] Inject 1 Device into the skin every 3 (three) months.   zolpidem 10 MG tablet Commonly known as: AMBIEN Take 20 mg by mouth at bedtime.        Allergies  Allergen Reactions   Amoxicillin Anaphylaxis, Swelling and Other (See Comments)    Hand swelling (PENFAST 4+) Tolerated ceftriaxone 2021 PenFast Score 4+:  High risk of positive penicillin allergy (50%) Oral challenge NOT recommended  Cephalosporins may be considered under close monitoring   Azithromycin Anaphylaxis and Other (See Comments)   Fish-Derived Products Anaphylaxis and Other (See Comments)    Eats shellfish alright   Peanut-Containing Drug Products Hives, Itching, Rash and Other (See Comments)         Gluten Meal Itching, Rash and Other (See Comments)    Celiac disease   Iodinated Contrast Media Hives and Itching    02/04/2020 Patient stated has history of itching and hives with CT IV Iodine contrast, premedicated with Benadryl 25 mg IV prior to CT IV Iodine scan today.Post CT scan patient reported itching, Benadryl 25 mg IV given, symptoms resolved, NP recommends for patient to get premedicated with Benadryl 50 mg IV prior to  future CT IV Iodine scans.    Other Hives, Itching, Rash and Other (See Comments)    Moderna covid vaccine -Syncope - ICU 2 weeks Tree nuts- Hives, itching, rashes   Iodine Itching   Lactose Intolerance (Gi) Nausea And Vomiting and Other (See Comments)    Can tolerate, in small doses    Consultations: Gastroenterology   Procedures/Studies: CT Angio Chest/Abd/Pel for Dissection W and/or Wo Contrast  Result Date: 09/23/2022 CLINICAL DATA:  SHOB, CP, hx of PE, upper GI bleed EXAM: CT ANGIOGRAPHY CHEST, ABDOMEN AND PELVIS TECHNIQUE: Non-contrast CT of the chest was initially obtained. Multidetector CT imaging through the chest, abdomen and pelvis was performed using the standard protocol during bolus administration of intravenous contrast. Multiplanar reconstructed images and MIPs were obtained and reviewed to evaluate the vascular anatomy. RADIATION DOSE REDUCTION: This exam was performed according to the departmental dose-optimization program which includes automated exposure control, adjustment of the mA and/or kV according to patient size and/or use of iterative reconstruction technique. CONTRAST:  OMNIPAQUE IOHEXOL 350 MG/ML SOLN COMPARISON:  CT abdomen pelvis 02/12/2022 FINDINGS: CTA CHEST FINDINGS Cardiovascular: Preferential opacification of the thoracic aorta. No evidence  of thoracic aortic aneurysm or dissection. Normal heart size. No significant pericardial effusion. No atherosclerotic plaque of the thoracic aorta. No coronary artery calcifications. The main pulmonary artery is normal in caliber. Mediastinum/Nodes: No enlarged mediastinal, hilar, or axillary lymph nodes. Thyroid gland, trachea, and esophagus demonstrate no significant findings. Lungs/Pleura: Bilateral lower lobe atelectasis. No focal consolidation. No pulmonary nodule. No pulmonary mass. No pleural effusion. No pneumothorax. Musculoskeletal: No chest wall abnormality. No suspicious lytic or blastic osseous lesions. No  acute displaced fracture. Review of the MIP images confirms the above findings. CTA ABDOMEN AND PELVIS FINDINGS VASCULAR No evidence extravasation of intravenous contrast within the gastric lumen on arterial view. Aorta: Normal caliber aorta without aneurysm, dissection, vasculitis or significant stenosis. Celiac: Patent without evidence of aneurysm, dissection, vasculitis or significant stenosis. SMA: Patent without evidence of aneurysm, dissection, vasculitis or significant stenosis. Renals: Both renal arteries are patent without evidence of aneurysm, dissection, vasculitis, fibromuscular dysplasia or significant stenosis. IMA: Patent without evidence of aneurysm, dissection, vasculitis or significant stenosis. Inflow: Patent without evidence of aneurysm, dissection, vasculitis or significant stenosis. Veins: No obvious venous abnormality within the limitations of this arterial phase study. Review of the MIP images confirms the above findings. NON-VASCULAR Hepatobiliary: Pericentimeter fluid density likely represents simple renal cyst. Status post cholecystectomy. No biliary dilatation. Pancreas: No focal lesion. Normal pancreatic contour. No surrounding inflammatory changes. No main pancreatic ductal dilatation. Spleen: Normal in size without focal abnormality. Adrenals/Urinary Tract: No adrenal nodule bilaterally. Bilateral kidneys enhance symmetrically. No hydronephrosis. No hydroureter. The urinary bladder is unremarkable. Stomach/Bowel: Stomach is within normal limits. No evidence of bowel wall thickening or dilatation. Status post appendectomy. Lymphatic: No lymphadenopathy. Reproductive: Uterus and bilateral adnexa are unremarkable. Other: No intraperitoneal free fluid. No intraperitoneal free gas. No organized fluid collection. Musculoskeletal: No abdominal wall hernia or abnormality. No suspicious lytic or blastic osseous lesions. No acute displaced fracture. Review of the MIP images confirms the above  findings. IMPRESSION: 1. No acute thoracic or abdominal aorta abnormality. 2. No acute intra-abdominal or intrapelvic abnormality. 3. Status post cholecystectomy and appendectomy. Electronically Signed   By: Tish Frederickson M.D.   On: 09/23/2022 19:41   US PELVIS (TRANSABDOMINAL ONLY)  Result Date: 08/30/2022 CLINICAL DATA:  Pelvic pain for 1 day.  Unknown LMP. EXAM: TRANSABDOMINAL ULTRASOUND OF PELVIS TECHNIQUE: Transabdominal ultrasound examination of the pelvis was performed including evaluation of the uterus, ovaries, adnexal regions, and pelvic cul-de-sac. COMPARISON:  02/12/2022 CT, 01/20/2022 ultrasound FINDINGS: Uterus Measurements: 7.2 x 2.1 x 4.1 centimeters = volume: 33.3 mL. No fibroids or other mass visualized. Endometrium Thickness: Not visualized. Right ovary Measurements: Not visualized.  No adnexal mass identified. Left ovary Measurements: Not visualized.  No adnexal mass identified. Other findings: Study quality is degraded by patient body habitus and overlying bowel gas. Patient declined transvaginal portion of the exam. IMPRESSION: 1. Study is limited by patient body habitus and overlying bowel gas. 2. No uterine mass is identified. The endometrium was not visualized. The ovaries are not visualized. Electronically Signed   By: Norva Pavlov M.D.   On: 08/30/2022 16:23   (Echo, Carotid, EGD, Colonoscopy, ERCP)    Subjective: Patient seen and examined.  Patient tells me that morphine did not work to relieve her pain.  She is tolerating soft diet.  Without nausea today.  Agreeable to manage symptoms at home with oral pain medications.   Discharge Exam: Vitals:   09/25/22 0451 09/25/22 0838  BP: 122/81 121/74  Pulse: 83 78  Resp: 19  Temp: 97.6 F (36.4 C) 98 F (36.7 C)  SpO2: 99% 98%   Vitals:   09/24/22 2213 09/25/22 0451 09/25/22 0500 09/25/22 0838  BP: (!) 144/106 122/81  121/74  Pulse: 89 83  78  Resp: (!) 22 19    Temp: 98.1 F (36.7 C) 97.6 F (36.4 C)  98 F  (36.7 C)  TempSrc: Oral Oral  Oral  SpO2: 100% 99%  98%  Weight:   128.4 kg   Height:        General: Pt is alert, awake, not in acute distress Cardiovascular: RRR, S1/S2 +, no rubs, no gallops Respiratory: CTA bilaterally, no wheezing, no rhonchi Abdominal: Soft, NT, ND, bowel sounds +, no localized tenderness. Extremities: no edema, no cyanosis    The results of significant diagnostics from this hospitalization (including imaging, microbiology, ancillary and laboratory) are listed below for reference.     Microbiology: No results found for this or any previous visit (from the past 240 hour(s)).   Labs: BNP (last 3 results) No results for input(s): "BNP" in the last 8760 hours. Basic Metabolic Panel: Recent Labs  Lab 09/23/22 1101 09/24/22 0828 09/25/22 0559  NA 138 137 138  K 3.5 3.5 3.3*  CL 109 105 105  CO2 20* 21* 20*  GLUCOSE 120* 116* 82  BUN 7 <5* 6  CREATININE 0.85 0.85 0.90  CALCIUM 9.0 9.2 8.7*   Liver Function Tests: Recent Labs  Lab 09/23/22 1101 09/24/22 0828 09/25/22 0559  AST 15 15 12*  ALT 13 14 11   ALKPHOS 73 77 68  BILITOT 0.4 0.8 0.4  PROT 6.9 6.9 6.0*  ALBUMIN 3.7 3.7 3.3*   Recent Labs  Lab 09/23/22 1101  LIPASE 34   No results for input(s): "AMMONIA" in the last 168 hours. CBC: Recent Labs  Lab 09/23/22 1101 09/23/22 2249 09/24/22 0828 09/24/22 1850 09/25/22 0559  WBC 7.3 8.6 11.2* 10.7* 10.0  HGB 11.9* 12.1 11.8* 10.8* 10.7*  HCT 37.0 38.4 35.8* 34.2* 33.4*  MCV 72.8* 74.7* 74.1* 74.0* 73.6*  PLT 378 387 379 350 337   Cardiac Enzymes: No results for input(s): "CKTOTAL", "CKMB", "CKMBINDEX", "TROPONINI" in the last 168 hours. BNP: Invalid input(s): "POCBNP" CBG: No results for input(s): "GLUCAP" in the last 168 hours. D-Dimer No results for input(s): "DDIMER" in the last 72 hours. Hgb A1c No results for input(s): "HGBA1C" in the last 72 hours. Lipid Profile No results for input(s): "CHOL", "HDL", "LDLCALC",  "TRIG", "CHOLHDL", "LDLDIRECT" in the last 72 hours. Thyroid function studies No results for input(s): "TSH", "T4TOTAL", "T3FREE", "THYROIDAB" in the last 72 hours.  Invalid input(s): "FREET3" Anemia work up Recent Labs    09/23/22 1122 09/23/22 2249  VITAMINB12  --  365  FOLATE 2.4*  --   FERRITIN  --  22  TIBC  --  412  IRON  --  31  RETICCTPCT  --  1.2   Urinalysis    Component Value Date/Time   COLORURINE YELLOW 09/23/2022 1122   APPEARANCEUR HAZY (A) 09/23/2022 1122   LABSPEC 1.016 09/23/2022 1122   PHURINE 5.0 09/23/2022 1122   GLUCOSEU NEGATIVE 09/23/2022 1122   HGBUR NEGATIVE 09/23/2022 1122   BILIRUBINUR NEGATIVE 09/23/2022 1122   KETONESUR NEGATIVE 09/23/2022 1122   PROTEINUR NEGATIVE 09/23/2022 1122   UROBILINOGEN 0.2 09/06/2021 0946   NITRITE NEGATIVE 09/23/2022 1122   LEUKOCYTESUR NEGATIVE 09/23/2022 1122   Sepsis Labs Recent Labs  Lab 09/23/22 2249 09/24/22 0828 09/24/22 1850 09/25/22 0559  WBC 8.6  11.2* 10.7* 10.0   Microbiology No results found for this or any previous visit (from the past 240 hour(s)).   Time coordinating discharge:  32 minutes  SIGNED:   Dorcas Carrow, MD  [facility] 09/25/2022, 1:50 PM

## 2022-09-25 NOTE — TOC Transition Note (Signed)
Transition of Care [facility]) - CM/SW Discharge Note   Patient Details  Name: [name] MRN: [id number] Date of Birth: [birth year]  Transition of Care [facility]) CM/SW Contact:  Tom-Johnson, Hershal Coria, RN Phone Number: 09/25/2022, 10:02 AM   Clinical Narrative:     Patient is scheduled for discharge today.  Readmission Risk Assessment done. New patient establishment, hospital f/u and discharge instructions on AVS. Neighbor, Britta Mccreedy to transport at discharge.  No further TOC needs noted.        Final next level of care: Home/Self Care Barriers to Discharge: Barriers Resolved   Patient Goals and CMS Choice CMS Medicare.gov Compare Post Acute Care list provided to:: Patient Choice offered to / list presented to : NA  Discharge Placement                  Patient to be transferred to facility by: Neighbor Name of family member notified: [facility] Additional resources added to the After Visit Summary for                  DME Arranged: N/A DME Agency: NA       HH Arranged: NA HH Agency: NA        Social Determinants of Health (SDOH) Interventions SDOH Screenings   Food Insecurity: Patient Declined (07/28/2022)   Received from [facility]  Transportation Needs: Patient Declined (07/28/2022)   Received from [facility]  Utilities: Patient Declined (07/28/2022)   Received from [facility]  Alcohol Screen: Low Risk  (05/30/2020)  Depression (PHQ2-9): Medium Risk (12/28/2021)  Social Connections: Low Risk  (02/03/2022)   Received from [employer]  Tobacco Use: Low Risk  (09/24/2022)     Readmission Risk Interventions    09/25/2022    9:57 AM 08/25/2021   10:42 AM  Readmission Risk Prevention Plan  Transportation Screening Complete Complete  PCP or Specialist Appt within 5-7 Days Complete   Home Care Screening Complete   Medication Review (RN CM) Referral to Pharmacy    Medication Review (RN Care Manager)  Complete  PCP or Specialist appointment within 3-5 days of discharge  Complete  HRI or Home Care Consult  Complete  SW Recovery Care/Counseling Consult  Complete  Palliative Care Screening  Not Applicable  Skilled Nursing Facility  Not Applicable

## 2022-09-25 NOTE — Plan of Care (Signed)
Discharge instructions discussed with patient.  Patient instructed on home medications, restrictions, and follow up appointments. Belongings gathered and sent with patient.  Patients medications sent to [facility].  Patient discharged via wheelchair by this Probation officer.

## 2022-09-26 ENCOUNTER — Emergency Department (HOSPITAL_COMMUNITY): Payer: Self-pay

## 2022-09-26 ENCOUNTER — Encounter (HOSPITAL_COMMUNITY): Payer: Self-pay

## 2022-09-26 ENCOUNTER — Other Ambulatory Visit: Payer: Self-pay

## 2022-09-26 ENCOUNTER — Emergency Department (HOSPITAL_COMMUNITY)
Admission: EM | Admit: 2022-09-26 | Discharge: 2022-09-26 | Disposition: A | Discharge status: Home / Self Care | Payer: Self-pay | Attending: Emergency Medicine | Admitting: Emergency Medicine

## 2022-09-26 DIAGNOSIS — R000 Tachycardia, unspecified: Secondary | ICD-10-CM | POA: Insufficient documentation

## 2022-09-26 DIAGNOSIS — R109 Unspecified abdominal pain: Secondary | ICD-10-CM | POA: Insufficient documentation

## 2022-09-26 DIAGNOSIS — R112 Nausea with vomiting, unspecified: Secondary | ICD-10-CM | POA: Insufficient documentation

## 2022-09-26 DIAGNOSIS — G8929 Other chronic pain: Secondary | ICD-10-CM | POA: Insufficient documentation

## 2022-09-26 DIAGNOSIS — Z91010 Allergy to peanuts: Secondary | ICD-10-CM | POA: Insufficient documentation

## 2022-09-26 LAB — URINALYSIS, ROUTINE W REFLEX MICROSCOPIC
Bilirubin Urine: NEGATIVE
Glucose, UA: NEGATIVE mg/dL
Hgb urine dipstick: NEGATIVE
Ketones, ur: NEGATIVE mg/dL
Leukocytes,Ua: NEGATIVE
Nitrite: NEGATIVE
Protein, ur: NEGATIVE mg/dL
Specific Gravity, Urine: 1.028 (ref 1.005–1.030)
pH: 8 (ref 5.0–8.0)

## 2022-09-26 LAB — COMPREHENSIVE METABOLIC PANEL
ALT: 15 U/L (ref 0–44)
AST: 18 U/L (ref 15–41)
Albumin: 3.6 g/dL (ref 3.5–5.0)
Alkaline Phosphatase: 72 U/L (ref 38–126)
Anion gap: 14 (ref 5–15)
BUN: 8 mg/dL (ref 6–20)
CO2: 21 mmol/L — ABNORMAL LOW (ref 22–32)
Calcium: 9.1 mg/dL (ref 8.9–10.3)
Chloride: 105 mmol/L (ref 98–111)
Creatinine, Ser: 0.81 mg/dL (ref 0.44–1.00)
GFR, Estimated: >60 mL/min (ref >60)
Glucose, Bld: 125 mg/dL — ABNORMAL HIGH (ref 70–99)
Potassium: 3.6 mmol/L (ref 3.5–5.1)
Sodium: 140 mmol/L (ref 135–145)
Total Bilirubin: 0.3 mg/dL (ref 0.3–1.2)
Total Protein: 6.3 g/dL — ABNORMAL LOW (ref 6.5–8.1)

## 2022-09-26 LAB — CBC WITH DIFFERENTIAL/PLATELET
Abs Immature Granulocytes: 0.04 10*3/uL (ref 0.00–0.07)
Basophils Absolute: 0 10*3/uL (ref 0.0–0.1)
Basophils Relative: 0 %
Eosinophils Absolute: 0.1 10*3/uL (ref 0.0–0.5)
Eosinophils Relative: 1 %
HCT: 35.5 % — ABNORMAL LOW (ref 36.0–46.0)
Hemoglobin: 11.3 g/dL — ABNORMAL LOW (ref 12.0–15.0)
Immature Granulocytes: 0 %
Lymphocytes Relative: 21 %
Lymphs Abs: 2.1 10*3/uL (ref 0.7–4.0)
MCH: 23.9 pg — ABNORMAL LOW (ref 26.0–34.0)
MCHC: 31.8 g/dL (ref 30.0–36.0)
MCV: 75.1 fL — ABNORMAL LOW (ref 80.0–100.0)
Monocytes Absolute: 0.4 10*3/uL (ref 0.1–1.0)
Monocytes Relative: 4 %
Neutro Abs: 7.5 10*3/uL (ref 1.7–7.7)
Neutrophils Relative %: 74 %
Platelets: 366 10*3/uL (ref 150–400)
RBC: 4.73 MIL/uL (ref 3.87–5.11)
RDW: 16.2 % — ABNORMAL HIGH (ref 11.5–15.5)
WBC: 10.2 10*3/uL (ref 4.0–10.5)
nRBC: 0 % (ref 0.0–0.2)

## 2022-09-26 LAB — HCG, QUANTITATIVE, PREGNANCY: hCG, Beta Chain, Quant, S: <1 m[IU]/mL (ref <5)

## 2022-09-26 LAB — LIPASE, BLOOD: Lipase: 29 U/L (ref 11–51)

## 2022-09-26 MED ORDER — DIPHENHYDRAMINE HCL 50 MG/ML IJ SOLN
25.0000 mg | Freq: Once | INTRAMUSCULAR | Status: DC
  Filled 2022-09-26: qty 1

## 2022-09-26 MED ORDER — LACTATED RINGERS IV BOLUS
1000.0000 mL | Freq: Once | INTRAVENOUS | Status: AC
  Administered 2022-09-26: 1000 mL via INTRAVENOUS

## 2022-09-26 MED ORDER — DROPERIDOL 2.5 MG/ML IJ SOLN
1.2500 mg | Freq: Once | INTRAMUSCULAR | Status: AC
  Administered 2022-09-26: 1.25 mg via INTRAVENOUS
  Filled 2022-09-26: qty 2

## 2022-09-26 MED ORDER — IOHEXOL 350 MG/ML SOLN
75.0000 mL | Freq: Once | INTRAVENOUS | Status: AC | PRN
  Administered 2022-09-26: 75 mL via INTRAVENOUS

## 2022-09-26 MED ORDER — DIPHENHYDRAMINE HCL 50 MG/ML IJ SOLN
25.0000 mg | Freq: Once | INTRAMUSCULAR | Status: AC
  Administered 2022-09-26: 50 mg via INTRAVENOUS
  Filled 2022-09-26: qty 1

## 2022-09-26 MED ORDER — METHYLPREDNISOLONE SODIUM SUCC 125 MG IJ SOLR
40.0000 mg | Freq: Once | INTRAMUSCULAR | Status: AC
  Administered 2022-09-26: 40 mg via INTRAVENOUS
  Filled 2022-09-26: qty 2

## 2022-09-26 NOTE — ED Provider Notes (Signed)
[place] EMERGENCY DEPARTMENT AT [facility] Provider Note   CSN: [id number] Arrival date & time: 09/26/22  1157     History {Add pertinent medical, surgical, social history, OB history to HPI:1} Chief Complaint  Patient presents with   Hematemesis    [name] is a 27 y.o. female.  27 year old female with a history of cluster B personality disorder, PE not currently on anticoagulation, celiac disease, endometriosis, and lupus who presents emergency department with nausea and vomiting.  Patient has had multiple visits to the emergency department with abdominal pain and nausea and vomiting.  Was admitted and was discharged yesterday after she was evaluated by GI and had endoscopy that did not show any evidence of gastric disease.  Also had a CTA that did not reveal any evidence of bleeding.  Says that after going home has had pink emesis multiple times.  No diarrhea.  Is concerned about an ileus as well which she has had.  Reports that she has had multiple surgeries for her endometriosis and has had an appendectomy as well as a cholecystectomy.       Home Medications Prior to Admission medications   Medication Sig Start Date End Date Taking? Authorizing Provider  ALPRAZolam Prudy Feeler) 1 MG tablet Take 1.5 mg by mouth 2 (two) times daily.    [provider]  Goserelin Acetate (ZOLADEX [place]) Inject 1 Device into the skin every 3 (three) months.    [provider]  hydroxychloroquine (PLAQUENIL) 200 MG tablet Take 400 mg by mouth at bedtime.    [provider]  norethindrone (AYGESTIN) 5 MG tablet Take 2 tablets (10 mg total) by mouth at bedtime. 09/25/22 10/25/22  Dorcas Carrow, MD  oxyCODONE (OXY IR/ROXICODONE) 5 MG immediate release tablet Take 1 tablet (5 mg total) by mouth every 6 (six) hours as needed for moderate pain. 09/25/22   Dorcas Carrow, MD  pantoprazole (PROTONIX) 20 MG tablet Take 1 tablet (20 mg total) by mouth daily. 09/25/22 10/25/22   Dorcas Carrow, MD  promethazine (PHENERGAN) 25 MG tablet Take 1 tablet (25 mg total) by mouth every 6 (six) hours as needed for nausea or vomiting. 09/25/22 10/25/22  Dorcas Carrow, MD  zolpidem (AMBIEN) 10 MG tablet Take 20 mg by mouth at bedtime.    [provider]  dicyclomine (BENTYL) 20 MG tablet Take 1 tablet (20 mg total) by mouth 2 (two) times daily. 11/29/18 04/11/19  Muthersbaugh, Dahlia Client, PA-C  Fluticasone Propionate HFA (FLOVENT HFA IN) Inhale into the lungs. Patient not taking: Reported on 11/14/2019  11/14/19  [provider]      Allergies    Amoxicillin, Azithromycin, Fish-derived products, Peanut-containing drug products, Gluten meal, Iodinated contrast media, Other, Iodine, and Lactose intolerance (gi)    Review of Systems   Review of Systems  Physical Exam Updated Vital Signs BP (!) 131/99   Pulse (!) 107   Temp 98.4 F (36.9 C) (Oral)   Resp 18   Ht 5\' 2"  (1.575 m)   Wt 104.3 kg   SpO2 97%   BMI 42.07 kg/m  Physical Exam Vitals and nursing note reviewed.  Constitutional:      General: She is not in acute distress.    Appearance: She is well-developed.  HENT:     Head: Normocephalic and atraumatic.     Right Ear: External ear normal.     Left Ear: External ear normal.     Nose: Nose normal.  Eyes:     Extraocular  Movements: Extraocular movements intact.     Conjunctiva/sclera: Conjunctivae normal.     Pupils: Pupils are equal, round, and reactive to light.  Cardiovascular:     Rate and Rhythm: Regular rhythm. Tachycardia present.     Heart sounds: No murmur heard. Pulmonary:     Effort: Pulmonary effort is normal. No respiratory distress.     Breath sounds: Normal breath sounds.  Abdominal:     General: Abdomen is flat. There is no distension.     Palpations: Abdomen is soft. There is no mass.     Tenderness: There is abdominal tenderness (Diffuse, mild). There is no guarding.  Musculoskeletal:     Cervical back: Normal range of  motion and neck supple.  Skin:    General: Skin is warm and dry.  Neurological:     Mental Status: She is alert and oriented to person, place, and time. Mental status is at baseline.  Psychiatric:     Comments: Anxious appearing   Emesis from today:   ED Results / Procedures / Treatments   Labs (all labs ordered are listed, but only abnormal results are displayed) Labs Reviewed  CBC WITH DIFFERENTIAL/PLATELET - Abnormal; Notable for the following components:      Result Value   Hemoglobin 11.3 (*)    HCT 35.5 (*)    MCV 75.1 (*)    MCH 23.9 (*)    RDW 16.2 (*)    All other components within normal limits  CBC WITH DIFFERENTIAL/PLATELET  COMPREHENSIVE METABOLIC PANEL  HCG, QUANTITATIVE, PREGNANCY  LIPASE, BLOOD    EKG None  Radiology No results found.  Procedures Procedures  {Document cardiac monitor, telemetry assessment procedure when appropriate:1}  Medications Ordered in ED Medications  methylPREDNISolone sodium succinate (SOLU-MEDROL) 125 mg/2 mL injection 40 mg (40 mg Intravenous Given 09/26/22 1454)    ED Course/ Medical Decision Making/ A&P   {   Click here for ABCD2, HEART and other calculatorsREFRESH Note before signing :1}                              Medical Decision Making Amount and/or Complexity of Data Reviewed Radiology: ordered.  Risk Prescription drug management.   ***  {Document critical care time when appropriate:1} {Document review of labs and clinical decision tools ie heart score, Chads2Vasc2 etc:1}  {Document your independent review of radiology images, and any outside records:1} {Document your discussion with family members, caretakers, and with consultants:1} {Document social determinants of health affecting pt's care:1} {Document your decision making why or why not admission, treatments were needed:1} Final Clinical Impression(s) / ED Diagnoses Final diagnoses:  None    Rx / DC Orders ED Discharge Orders     None

## 2022-09-26 NOTE — ED Notes (Signed)
Pt called out and upset that RN has only seen pt once in the last 30 minutes and would like to speak with MD. MD notified.

## 2022-09-26 NOTE — Anesthesia Postprocedure Evaluation (Signed)
Anesthesia Post Note  Patient: [name]  Procedure(s) Performed: ESOPHAGOGASTRODUODENOSCOPY (EGD) (Left)     Patient location during evaluation: PACU Anesthesia Type: MAC Level of consciousness: awake and alert Pain management: pain level controlled Vital Signs Assessment: post-procedure vital signs reviewed and stable Respiratory status: spontaneous breathing, nonlabored ventilation, respiratory function stable and patient connected to nasal cannula oxygen Cardiovascular status: stable and blood pressure returned to baseline Postop Assessment: no apparent nausea or vomiting Anesthetic complications: no   No notable events documented.  Last Vitals:  Vitals:   09/25/22 0451 09/25/22 0838  BP: 122/81 121/74  Pulse: 83 78  Resp: 19   Temp: 36.4 C 36.7 C  SpO2: 99% 98%    Last Pain:  Vitals:   09/25/22 0838  TempSrc: Oral  PainSc:                  Carlo Guevarra S

## 2022-09-26 NOTE — ED Notes (Addendum)
Patient left without discharge instructions. Refused to wait for them & refused last set of vitals

## 2022-09-26 NOTE — ED Provider Triage Note (Signed)
Emergency Medicine Provider Triage Evaluation Note  Vevelyn Royals , a 27 y.o. female  was evaluated in triage.  Pt complains of hematemesis.  Review of Systems  Positive:  Negative:   Physical Exam  BP (!) 154/94 (BP Location: Right Arm)   Pulse (!) 107   Temp 98.3 F (36.8 C)   Resp 19   Ht 5\' 2"  (1.575 m)   Wt 104.3 kg   SpO2 100%   BMI 42.07 kg/m  Gen:   Awake, no distress   Resp:  Normal effort  MSK:   Moves extremities without difficulty  Other:    Medical Decision Making  Medically screening exam initiated at 1:22 PM.  Appropriate orders placed.  Allisha Auden Meuser was informed that the remainder of the evaluation will be completed by another provider, this initial triage assessment does not replace that evaluation, and the importance of remaining in the ED until their evaluation is complete.  Was discharged yesterday for same symptoms. Had upper endoscopy yesterday which was normal.  Concerned for hematemesis x5 days. Pain in abdomen and pelvis. Patient with severe endometriosis.    Valrie Hart F, [place] 09/26/22 1332

## 2022-09-26 NOTE — ED Notes (Signed)
Pt states she is aspirating, pt is axox4, conscious, and talking. Pt is not in any apparent distress at this time.

## 2022-09-26 NOTE — ED Notes (Signed)
Shift report received, assumed care of patient at this time 

## 2022-09-26 NOTE — ED Notes (Signed)
RN sent down purple top per labs request

## 2022-09-26 NOTE — ED Triage Notes (Signed)
Pt came in via POV d/t Hematemesis that has continued since she was seen for it in the ED yesterday. A/Ox4, 10/10 abd pain & reports she can feel this in her pelvic area as well.

## 2022-09-27 ENCOUNTER — Encounter (HOSPITAL_COMMUNITY): Payer: Self-pay | Admitting: Emergency Medicine

## 2022-09-27 ENCOUNTER — Other Ambulatory Visit: Payer: Self-pay

## 2022-09-27 ENCOUNTER — Inpatient Hospital Stay (HOSPITAL_COMMUNITY)
Admission: EM | Admit: 2022-09-27 | Discharge: 2022-09-29 | DRG: 305 | Discharge status: Against Medical Advice | Payer: Self-pay | Attending: Internal Medicine | Admitting: Internal Medicine

## 2022-09-27 ENCOUNTER — Observation Stay (HOSPITAL_COMMUNITY): Payer: Self-pay

## 2022-09-27 ENCOUNTER — Emergency Department (HOSPITAL_COMMUNITY): Payer: Self-pay

## 2022-09-27 DIAGNOSIS — R000 Tachycardia, unspecified: Secondary | ICD-10-CM | POA: Diagnosis present

## 2022-09-27 DIAGNOSIS — I161 Hypertensive emergency: Principal | ICD-10-CM | POA: Diagnosis present

## 2022-09-27 DIAGNOSIS — Z6841 Body Mass Index (BMI) 40.0 and over, adult: Secondary | ICD-10-CM

## 2022-09-27 DIAGNOSIS — E611 Iron deficiency: Secondary | ICD-10-CM

## 2022-09-27 DIAGNOSIS — R1032 Left lower quadrant pain: Secondary | ICD-10-CM

## 2022-09-27 DIAGNOSIS — R9431 Abnormal electrocardiogram [ECG] [EKG]: Secondary | ICD-10-CM | POA: Diagnosis present

## 2022-09-27 DIAGNOSIS — I10 Essential (primary) hypertension: Secondary | ICD-10-CM

## 2022-09-27 DIAGNOSIS — G8929 Other chronic pain: Secondary | ICD-10-CM | POA: Diagnosis present

## 2022-09-27 DIAGNOSIS — Z8349 Family history of other endocrine, nutritional and metabolic diseases: Secondary | ICD-10-CM

## 2022-09-27 DIAGNOSIS — Z803 Family history of malignant neoplasm of breast: Secondary | ICD-10-CM

## 2022-09-27 DIAGNOSIS — Z9049 Acquired absence of other specified parts of digestive tract: Secondary | ICD-10-CM

## 2022-09-27 DIAGNOSIS — Z8041 Family history of malignant neoplasm of ovary: Secondary | ICD-10-CM

## 2022-09-27 DIAGNOSIS — Z880 Allergy status to penicillin: Secondary | ICD-10-CM

## 2022-09-27 DIAGNOSIS — G2581 Restless legs syndrome: Secondary | ICD-10-CM | POA: Diagnosis present

## 2022-09-27 DIAGNOSIS — D509 Iron deficiency anemia, unspecified: Secondary | ICD-10-CM | POA: Diagnosis present

## 2022-09-27 DIAGNOSIS — R1031 Right lower quadrant pain: Secondary | ICD-10-CM

## 2022-09-27 DIAGNOSIS — Z91018 Allergy to other foods: Secondary | ICD-10-CM

## 2022-09-27 DIAGNOSIS — K5900 Constipation, unspecified: Secondary | ICD-10-CM | POA: Diagnosis present

## 2022-09-27 DIAGNOSIS — M329 Systemic lupus erythematosus, unspecified: Secondary | ICD-10-CM | POA: Diagnosis present

## 2022-09-27 DIAGNOSIS — Z91041 Radiographic dye allergy status: Secondary | ICD-10-CM

## 2022-09-27 DIAGNOSIS — Z79899 Other long term (current) drug therapy: Secondary | ICD-10-CM

## 2022-09-27 DIAGNOSIS — Z833 Family history of diabetes mellitus: Secondary | ICD-10-CM

## 2022-09-27 DIAGNOSIS — J45909 Unspecified asthma, uncomplicated: Secondary | ICD-10-CM | POA: Diagnosis present

## 2022-09-27 DIAGNOSIS — D638 Anemia in other chronic diseases classified elsewhere: Secondary | ICD-10-CM | POA: Diagnosis present

## 2022-09-27 DIAGNOSIS — D649 Anemia, unspecified: Secondary | ICD-10-CM | POA: Diagnosis present

## 2022-09-27 DIAGNOSIS — R1084 Generalized abdominal pain: Principal | ICD-10-CM

## 2022-09-27 DIAGNOSIS — Z8261 Family history of arthritis: Secondary | ICD-10-CM

## 2022-09-27 DIAGNOSIS — R7989 Other specified abnormal findings of blood chemistry: Secondary | ICD-10-CM | POA: Diagnosis present

## 2022-09-27 DIAGNOSIS — E739 Lactose intolerance, unspecified: Secondary | ICD-10-CM | POA: Diagnosis present

## 2022-09-27 DIAGNOSIS — I2489 Other forms of acute ischemic heart disease: Secondary | ICD-10-CM | POA: Diagnosis present

## 2022-09-27 DIAGNOSIS — K760 Fatty (change of) liver, not elsewhere classified: Secondary | ICD-10-CM | POA: Diagnosis present

## 2022-09-27 DIAGNOSIS — N809 Endometriosis, unspecified: Secondary | ICD-10-CM | POA: Diagnosis present

## 2022-09-27 DIAGNOSIS — Z91010 Allergy to peanuts: Secondary | ICD-10-CM

## 2022-09-27 DIAGNOSIS — F6089 Other specific personality disorders: Secondary | ICD-10-CM | POA: Diagnosis present

## 2022-09-27 DIAGNOSIS — Z8249 Family history of ischemic heart disease and other diseases of the circulatory system: Secondary | ICD-10-CM

## 2022-09-27 DIAGNOSIS — R112 Nausea with vomiting, unspecified: Secondary | ICD-10-CM | POA: Diagnosis present

## 2022-09-27 DIAGNOSIS — Z888 Allergy status to other drugs, medicaments and biological substances status: Secondary | ICD-10-CM

## 2022-09-27 DIAGNOSIS — Z91013 Allergy to seafood: Secondary | ICD-10-CM

## 2022-09-27 DIAGNOSIS — Z5329 Procedure and treatment not carried out because of patient's decision for other reasons: Secondary | ICD-10-CM | POA: Diagnosis present

## 2022-09-27 DIAGNOSIS — Z881 Allergy status to other antibiotic agents status: Secondary | ICD-10-CM

## 2022-09-27 DIAGNOSIS — K920 Hematemesis: Secondary | ICD-10-CM | POA: Diagnosis present

## 2022-09-27 DIAGNOSIS — E538 Deficiency of other specified B group vitamins: Secondary | ICD-10-CM | POA: Diagnosis present

## 2022-09-27 HISTORY — DX: Essential (primary) hypertension: I10

## 2022-09-27 LAB — CBC
HCT: 34.9 % — ABNORMAL LOW (ref 36.0–46.0)
Hemoglobin: 11.3 g/dL — ABNORMAL LOW (ref 12.0–15.0)
MCH: 24 pg — ABNORMAL LOW (ref 26.0–34.0)
MCHC: 32.4 g/dL (ref 30.0–36.0)
MCV: 74.1 fL — ABNORMAL LOW (ref 80.0–100.0)
Platelets: 374 10*3/uL (ref 150–400)
RBC: 4.71 MIL/uL (ref 3.87–5.11)
RDW: 16.4 % — ABNORMAL HIGH (ref 11.5–15.5)
WBC: 12.7 10*3/uL — ABNORMAL HIGH (ref 4.0–10.5)
nRBC: 0 % (ref 0.0–0.2)

## 2022-09-27 LAB — RAPID URINE DRUG SCREEN, HOSP PERFORMED
Amphetamines: NOT DETECTED
Barbiturates: NOT DETECTED
Benzodiazepines: POSITIVE — AB
Cocaine: NOT DETECTED
Opiates: NOT DETECTED
Tetrahydrocannabinol: NOT DETECTED

## 2022-09-27 LAB — HEMOGLOBIN AND HEMATOCRIT, BLOOD
HCT: 36 % (ref 36.0–46.0)
HCT: 33.7 % — ABNORMAL LOW (ref 36.0–46.0)
Hemoglobin: 11.5 g/dL — ABNORMAL LOW (ref 12.0–15.0)
Hemoglobin: 11.1 g/dL — ABNORMAL LOW (ref 12.0–15.0)

## 2022-09-27 LAB — HEPATIC FUNCTION PANEL
ALT: 15 U/L (ref 0–44)
AST: 20 U/L (ref 15–41)
Albumin: 4 g/dL (ref 3.5–5.0)
Alkaline Phosphatase: 73 U/L (ref 38–126)
Bilirubin, Direct: 0.1 mg/dL (ref 0.0–0.2)
Indirect Bilirubin: 0.3 mg/dL (ref 0.3–0.9)
Total Bilirubin: 0.4 mg/dL (ref 0.3–1.2)
Total Protein: 7.3 g/dL (ref 6.5–8.1)

## 2022-09-27 LAB — BASIC METABOLIC PANEL
Anion gap: 12 (ref 5–15)
BUN: 7 mg/dL (ref 6–20)
CO2: 20 mmol/L — ABNORMAL LOW (ref 22–32)
Calcium: 8.8 mg/dL — ABNORMAL LOW (ref 8.9–10.3)
Chloride: 105 mmol/L (ref 98–111)
Creatinine, Ser: 0.9 mg/dL (ref 0.44–1.00)
GFR, Estimated: >60 mL/min (ref >60)
Glucose, Bld: 114 mg/dL — ABNORMAL HIGH (ref 70–99)
Potassium: 4.7 mmol/L (ref 3.5–5.1)
Sodium: 137 mmol/L (ref 135–145)

## 2022-09-27 LAB — URINALYSIS, ROUTINE W REFLEX MICROSCOPIC
Bilirubin Urine: NEGATIVE
Glucose, UA: NEGATIVE mg/dL
Hgb urine dipstick: NEGATIVE
Ketones, ur: NEGATIVE mg/dL
Leukocytes,Ua: NEGATIVE
Nitrite: NEGATIVE
Protein, ur: NEGATIVE mg/dL
Specific Gravity, Urine: 1.005 (ref 1.005–1.030)
pH: 6 (ref 5.0–8.0)

## 2022-09-27 LAB — TROPONIN I (HIGH SENSITIVITY)
Troponin I (High Sensitivity): 6 ng/L (ref <18)
Troponin I (High Sensitivity): 61 ng/L — ABNORMAL HIGH (ref <18)
Troponin I (High Sensitivity): 6 ng/L (ref <18)

## 2022-09-27 LAB — LIPASE, BLOOD: Lipase: 25 U/L (ref 11–51)

## 2022-09-27 LAB — HCG, SERUM, QUALITATIVE: Preg, Serum: NEGATIVE

## 2022-09-27 LAB — TYPE AND SCREEN
ABO/RH(D): AB POS
Antibody Screen: NEGATIVE

## 2022-09-27 MED ORDER — PROCHLORPERAZINE EDISYLATE 10 MG/2ML IJ SOLN
10.0000 mg | Freq: Four times a day (QID) | INTRAMUSCULAR | Status: DC | PRN
  Administered 2022-09-28 (×3): 10 mg via INTRAVENOUS
  Filled 2022-09-27 (×3): qty 2

## 2022-09-27 MED ORDER — METOPROLOL TARTRATE 5 MG/5ML IV SOLN
5.0000 mg | Freq: Three times a day (TID) | INTRAVENOUS | Status: DC
  Administered 2022-09-27 – 2022-09-29 (×5): 5 mg via INTRAVENOUS
  Filled 2022-09-27 (×5): qty 5

## 2022-09-27 MED ORDER — ACETAMINOPHEN 325 MG PO TABS
650.0000 mg | ORAL_TABLET | Freq: Four times a day (QID) | ORAL | Status: DC | PRN
  Filled 2022-09-27: qty 2

## 2022-09-27 MED ORDER — LACTATED RINGERS IV BOLUS
1000.0000 mL | Freq: Once | INTRAVENOUS | Status: AC
  Administered 2022-09-27: 1000 mL via INTRAVENOUS

## 2022-09-27 MED ORDER — PANTOPRAZOLE INFUSION (NEW) - SIMPLE MED
8.0000 mg/h | INTRAVENOUS | Status: DC
  Administered 2022-09-27 – 2022-09-28 (×2): 8 mg/h via INTRAVENOUS
  Filled 2022-09-27 (×2): qty 80
  Filled 2022-09-27: qty 100

## 2022-09-27 MED ORDER — PANTOPRAZOLE SODIUM 40 MG IV SOLR
40.0000 mg | Freq: Two times a day (BID) | INTRAVENOUS | Status: DC
  Administered 2022-09-27: 40 mg via INTRAVENOUS
  Filled 2022-09-27: qty 10

## 2022-09-27 MED ORDER — DIPHENHYDRAMINE HCL 25 MG PO CAPS
25.0000 mg | ORAL_CAPSULE | Freq: Four times a day (QID) | ORAL | Status: DC | PRN
  Filled 2022-09-27: qty 1

## 2022-09-27 MED ORDER — ACETAMINOPHEN 650 MG RE SUPP: 650.0000 mg | Freq: Four times a day (QID) | RECTAL | Status: DC | PRN

## 2022-09-27 MED ORDER — HYDRALAZINE HCL 20 MG/ML IJ SOLN
20.0000 mg | Freq: Once | INTRAMUSCULAR | Status: AC
  Administered 2022-09-27: 20 mg via INTRAVENOUS
  Filled 2022-09-27: qty 1

## 2022-09-27 MED ORDER — HYDRALAZINE HCL 20 MG/ML IJ SOLN: 20.0000 mg | INTRAMUSCULAR | Status: DC | PRN

## 2022-09-27 MED ORDER — HYDROMORPHONE HCL 1 MG/ML IJ SOLN
1.0000 mg | Freq: Once | INTRAMUSCULAR | Status: AC
  Administered 2022-09-27: 1 mg via INTRAVENOUS
  Filled 2022-09-27: qty 1

## 2022-09-27 MED ORDER — LINACLOTIDE 145 MCG PO CAPS
145.0000 ug | ORAL_CAPSULE | Freq: Every day | ORAL | Status: DC
  Administered 2022-09-28 – 2022-09-29 (×2): 145 ug via ORAL
  Filled 2022-09-27 (×2): qty 1

## 2022-09-27 MED ORDER — MORPHINE SULFATE (PF) 4 MG/ML IV SOLN
4.0000 mg | Freq: Once | INTRAVENOUS | Status: AC
  Administered 2022-09-27: 4 mg via INTRAVENOUS
  Filled 2022-09-27: qty 1

## 2022-09-27 MED ORDER — PROCHLORPERAZINE EDISYLATE 10 MG/2ML IJ SOLN
10.0000 mg | Freq: Once | INTRAMUSCULAR | Status: DC
  Filled 2022-09-27: qty 2

## 2022-09-27 MED ORDER — DIPHENHYDRAMINE HCL 50 MG/ML IJ SOLN
12.5000 mg | Freq: Once | INTRAMUSCULAR | Status: AC
  Administered 2022-09-27: 12.5 mg via INTRAVENOUS
  Filled 2022-09-27: qty 1

## 2022-09-27 MED ORDER — ONDANSETRON HCL 4 MG/2ML IJ SOLN
4.0000 mg | Freq: Once | INTRAMUSCULAR | Status: AC
  Administered 2022-09-27: 4 mg via INTRAVENOUS
  Filled 2022-09-27: qty 2

## 2022-09-27 MED ORDER — DIPHENHYDRAMINE HCL 50 MG/ML IJ SOLN
12.5000 mg | Freq: Four times a day (QID) | INTRAMUSCULAR | Status: AC | PRN
  Administered 2022-09-27 – 2022-09-28 (×2): 12.5 mg via INTRAVENOUS
  Filled 2022-09-27 (×2): qty 1

## 2022-09-27 MED ORDER — METOCLOPRAMIDE HCL 5 MG/ML IJ SOLN
10.0000 mg | Freq: Once | INTRAMUSCULAR | Status: AC
  Administered 2022-09-27: 10 mg via INTRAVENOUS
  Filled 2022-09-27: qty 2

## 2022-09-27 MED ORDER — HYDRALAZINE HCL 20 MG/ML IJ SOLN: 5.0000 mg | Freq: Once | INTRAMUSCULAR | Status: DC

## 2022-09-27 MED ORDER — MAGNESIUM SULFATE 2 GM/50ML IV SOLN
2.0000 g | Freq: Once | INTRAVENOUS | Status: AC
  Administered 2022-09-27: 2 g via INTRAVENOUS
  Filled 2022-09-27: qty 50

## 2022-09-27 MED ORDER — DIPHENHYDRAMINE HCL 50 MG/ML IJ SOLN
25.0000 mg | Freq: Once | INTRAMUSCULAR | Status: AC
  Administered 2022-09-27: 25 mg via INTRAVENOUS
  Filled 2022-09-27: qty 1

## 2022-09-27 MED ORDER — HYDROMORPHONE HCL 1 MG/ML IJ SOLN
1.0000 mg | INTRAMUSCULAR | Status: DC | PRN
  Administered 2022-09-27 – 2022-09-28 (×4): 1 mg via INTRAVENOUS
  Filled 2022-09-27 (×4): qty 1

## 2022-09-27 MED ORDER — SODIUM CHLORIDE 0.45 % IV SOLN: INTRAVENOUS | Status: DC

## 2022-09-27 MED ORDER — PANTOPRAZOLE SODIUM 40 MG IV SOLR
40.0000 mg | Freq: Once | INTRAVENOUS | Status: AC
  Administered 2022-09-27: 40 mg via INTRAVENOUS
  Filled 2022-09-27: qty 10

## 2022-09-27 MED ORDER — METOPROLOL TARTRATE 5 MG/5ML IV SOLN
5.0000 mg | Freq: Once | INTRAVENOUS | Status: AC
  Administered 2022-09-27: 5 mg via INTRAVENOUS
  Filled 2022-09-27: qty 5

## 2022-09-27 MED ORDER — PANTOPRAZOLE SODIUM 40 MG IV SOLR: 40.0000 mg | Freq: Two times a day (BID) | INTRAVENOUS | Status: DC

## 2022-09-27 NOTE — ED Notes (Signed)
ED TO INPATIENT HANDOFF REPORT  Name/Age/Gender [name] 27 y.o. female  Code Status Code Status History     Date Active Date Inactive Code Status Order ID Comments User Context   09/23/2022 2138 09/25/2022 1544 Full Code [id number]  Tereasa Coop, MD ED   08/23/2021 0409 08/25/2021 1947 Full Code [id number]  Darlin Drop, DO ED   06/17/2020 1624 06/19/2020 0008 Full Code [id number]  Jovita Kussmaul, MD ED   05/30/2020 0121 06/01/2020 1520 Full Code [id number]  Jaclyn Shaggy, PA-C Inpatient   05/28/2020 2031 05/30/2020 0107 Full Code [id number]  Pollyann Savoy, MD ED   04/04/2020 1221 04/06/2020 2007 Full Code [id number]  Jae Dire, MD ED    Questions for Most Recent Historical Code Status (Order [id number])     Question Answer   By: Consent: discussion documented in EHR            Home/SNF/Other Home  Chief Complaint Intractable nausea and vomiting [R11.2]  Level of Care/Admitting Diagnosis ED Disposition     ED Disposition  Admit   Condition  --   Comment  Hospital Area: [facility] [[id number]]  Level of Care: Telemetry [5]  Admit to tele based on following criteria: Monitor QTC interval  Admit to tele based on following criteria: Monitor for Ischemic changes  May place patient in observation at [facility] or [facility] if equivalent level of care is available:: No  Covid Evaluation: Asymptomatic - no recent exposure (last 10 days) testing not required  Diagnosis: Intractable nausea and vomiting [720114]  Admitting Physician: Bobette Mo [[id number]]  Attending Physician: Bobette Mo [[id number]]          Medical History 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   Collagen vascular disease (HCC)    Diarrhea    Patient mentions diagnosis of ulcerative colitis but it is not clear she actually has UC   Endometriosis    Lupus (HCC)    Ovarian cyst    Panic  disorder 05/30/2020   Pulmonary embolus (HCC) 02/06/2020   Seizures (HCC)     Allergies Allergies  Allergen Reactions   Amoxicillin Anaphylaxis, Swelling and Other (See Comments)    Hand swelling (PENFAST 4+) Tolerated ceftriaxone 2021 PenFast Score 4+:  High risk of positive penicillin allergy (50%) Oral challenge NOT recommended  Cephalosporins may be considered under close monitoring   Azithromycin Anaphylaxis and Other (See Comments)   Fish-Derived Products Anaphylaxis and Other (See Comments)    Eats shellfish alright   Peanut-Containing Drug Products Hives, Itching, Rash and Other (See Comments)         Gluten Meal Itching, Rash and Other (See Comments)    Celiac disease   Iodinated Contrast Media Hives and Itching    02/04/2020 Patient stated has history of itching and hives with CT IV Iodine contrast, premedicated with Benadryl 25 mg IV prior to CT IV Iodine scan today.Post CT scan patient reported itching, Benadryl 25 mg IV given, symptoms resolved, NP recommends for patient to get premedicated with Benadryl 50 mg IV prior to future CT IV Iodine scans.    Other Hives, Itching, Rash and Other (See Comments)    Moderna covid vaccine -Syncope - ICU 2 weeks Tree nuts- Hives, itching, rashes   Iodine Itching   Lactose Intolerance (Gi) Nausea And Vomiting and Other (See Comments)    Can tolerate,  in small doses    IV Location/Drains/Wounds Patient Lines/Drains/Airways Status     Active Line/Drains/Airways     Name Placement date Placement time Site Days   Peripheral IV 09/27/22 20 G Left Forearm 09/27/22  0648  Forearm  less than 1            Labs/Imaging Results for orders placed or performed during the hospital encounter of 09/27/22 (from the past 48 hour(s))  Basic metabolic panel     Status: Abnormal   Collection Time: 09/27/22  6:39 AM  Result Value Ref Range   Sodium 137 135 - 145 mmol/L   Potassium 4.7 3.5 - 5.1 mmol/L    Comment: MODERATE HEMOLYSIS    Chloride 105 98 - 111 mmol/L   CO2 20 (L) 22 - 32 mmol/L   Glucose, Bld 114 (H) 70 - 99 mg/dL    Comment: Glucose reference range applies only to samples taken after fasting for at least 8 hours.   BUN 7 6 - 20 mg/dL   Creatinine, Ser 0.98 0.44 - 1.00 mg/dL   Calcium 8.8 (L) 8.9 - 10.3 mg/dL   GFR, Estimated >11 >91 mL/min    Comment: (NOTE) Calculated using the CKD-EPI Creatinine Equation (2021)    Anion gap 12 5 - 15    Comment: Performed at [facility], [address], [place], [place] [address]  CBC     Status: Abnormal   Collection Time: 09/27/22  6:39 AM  Result Value Ref Range   WBC 12.7 (H) 4.0 - 10.5 K/uL   RBC 4.71 3.87 - 5.11 MIL/uL   Hemoglobin 11.3 (L) 12.0 - 15.0 g/dL   HCT 56.2 (L) 13.0 - 86.5 %   MCV 74.1 (L) 80.0 - 100.0 fL   MCH 24.0 (L) 26.0 - 34.0 pg   MCHC 32.4 30.0 - 36.0 g/dL   RDW 78.4 (H) 69.6 - 29.5 %   Platelets 374 150 - 400 K/uL   nRBC 0.0 0.0 - 0.2 %    Comment: Performed at [facility], [address] [place], [place] [address]  Troponin I (High Sensitivity)     Status: None   Collection Time: 09/27/22  6:39 AM  Result Value Ref Range   Troponin I (High Sensitivity) 6 <18 ng/L    Comment: (NOTE) Elevated high sensitivity troponin I (hsTnI) values and significant  changes across serial measurements may suggest ACS but many other  chronic and acute conditions are known to elevate hsTnI results.  Refer to the "Links" section for chest pain algorithms and additional  guidance. Performed at [facility], [address] [place], [place] [address]   hCG, serum, qualitative     Status: None   Collection Time: 09/27/22  6:39 AM  Result Value Ref Range   Preg, Serum NEGATIVE NEGATIVE    Comment:        THE SENSITIVITY OF THIS METHODOLOGY IS >10 mIU/mL. Performed at [facility], [address] [place], [place] [address]   Lipase, blood     Status: None   Collection  Time: 09/27/22  6:39 AM  Result Value Ref Range   Lipase 25 11 - 51 U/L    Comment: HEMOLYSIS AT THIS LEVEL MAY AFFECT RESULT Performed at [facility], [address], [place], [place] [address]   Troponin I (High Sensitivity)     Status: Abnormal   Collection Time: 09/27/22  9:00 AM  Result Value Ref  Range   Troponin I (High Sensitivity) 61 (H) <18 ng/L    Comment: Devetta Hagenow, A. RN AT [phone] ON 09/27/2022 BY MECIAL J. RESULT CALLED TO, READ BACK BY AND VERIFIED WITH (NOTE) Elevated high sensitivity troponin I (hsTnI) values and significant  changes across serial measurements may suggest ACS but many other  chronic and acute conditions are known to elevate hsTnI results.  Refer to the Links section for chest pain algorithms and additional  guidance. Performed at [facility], [address], [place], [place] [address]   Hepatic function panel     Status: None   Collection Time: 09/27/22 11:40 AM  Result Value Ref Range   Total Protein 7.3 6.5 - 8.1 g/dL   Albumin 4.0 3.5 - 5.0 g/dL   AST 20 15 - 41 U/L   ALT 15 0 - 44 U/L   Alkaline Phosphatase 73 38 - 126 U/L   Total Bilirubin 0.4 0.3 - 1.2 mg/dL   Bilirubin, Direct 0.1 0.0 - 0.2 mg/dL   Indirect Bilirubin 0.3 0.3 - 0.9 mg/dL    Comment: Performed at [facility], [address] [place], [place] [address]   DG Chest 2 View  Result Date: 09/27/2022 CLINICAL DATA:  27 year old female with history of chest pain. Emesis. EXAM: CHEST - 2 VIEW COMPARISON:  Chest x-ray 12/28/2021. FINDINGS: Lung volumes are normal. No consolidative airspace disease. No pleural effusions. No pneumothorax. No pulmonary nodule or mass noted. Pulmonary vasculature and the cardiomediastinal silhouette are within normal limits. IMPRESSION: No radiographic evidence of acute cardiopulmonary disease. Electronically Signed   By: Trudie Reed M.D.   On: 09/27/2022 06:53   CT Angio Abd/Pel W and/or Wo  Contrast  Result Date: 09/26/2022 CLINICAL DATA:  27 year old female with hematemesis, upper GI bleed suspected. EXAM: CTA ABDOMEN AND PELVIS WITHOUT AND WITH CONTRAST TECHNIQUE: Multidetector CT imaging of the abdomen and pelvis was performed using the standard protocol during bolus administration of intravenous contrast. Multiplanar reconstructed images and MIPs were obtained and reviewed to evaluate the vascular anatomy. RADIATION DOSE REDUCTION: This exam was performed according to the departmental dose-optimization program which includes automated exposure control, adjustment of the mA and/or kV according to patient size and/or use of iterative reconstruction technique. CONTRAST:  75mL OMNIPAQUE IOHEXOL 350 MG/ML SOLN COMPARISON:  CTA chest, abdomen and pelvis 09/23/2022, CT abdomen and pelvis no contrast 02/12/2022. FINDINGS: VASCULAR Aorta: Normal. Celiac: Normal. SMA: Normal. Renals: Single artery supplies the right kidney. 2 arteries arising in close tandem supply the left kidney with the dominant artery more cephalad in takeoff. All 3 are normal. No branch occlusion is evident. IMA: Very small in caliber but no evidence of aneurysm, dissection, vasculitis or flow-limiting stenosis. Inflow: Normal. Proximal Outflow: Bilateral common femoral and visualized portions of the superficial and profunda femoral arteries are patent without evidence of aneurysm, dissection, vasculitis or significant stenosis. Veins: Patent. Review of the MIP images confirms the above findings. NON-VASCULAR Lower chest: The cardiac size is normal. There is increased ground-glass opacity in the posterior basal right lower lobe consistent with asymmetric atelectasis, pneumonia or aspiration. The lung bases are otherwise clear. Chronic asymmetric elevation right hemidiaphragm. Hepatobiliary: Again noted is a 1.4 cm cyst superiorly in segment 8 of the liver, Hounsfield density is 16. No follow-up imaging is required. The liver is mildly  steatotic measuring 18.3 cm in length without further focal abnormality. Again noted surgical absence of the gallbladder no biliary dilatation. Pancreas: No abnormality. Spleen: No abnormality.  No splenomegaly. Adrenals/Urinary Tract: Adrenal glands are unremarkable. Kidneys are normal, without renal calculi, focal lesion, or hydronephrosis. Bladder is increasingly thickened but not fully distended. Correlate with urinalysis for possible cystitis. No imaging findings of ascending UTI. Stomach/Bowel: No arterial or venous phase contrast extravasation into the GI tract is seen. No bowel obstruction or inflammation is evident. 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 There is mild-to-moderate fecal stasis. Scattered sigmoid diverticulosis without diverticulitis. Lymphatic: No adenopathy is seen. Reproductive: Uterus and bilateral adnexa are unremarkable. Scattered pelvic phleboliths. Other: Small umbilical fat hernia. No incarcerated hernia. No free hemorrhage, free air or focal inflammatory process. There is trace low-density fluid in the pelvic cul-de-sac to the right, typically physiologic at this age, and unchanged. No further free fluid. Musculoskeletal: No acute or significant osseous findings. Slight lumbar dextroscoliosis possibly positional. IMPRESSION: 1. No arterial or venous phase contrast extravasation into the GI tract is seen. 2. No aortic aneurysm, dissection or other CTA abnormality. 3. Increased ground-glass opacity in the posterior basal right lower lobe consistent with asymmetric atelectasis, pneumonia or aspiration. 4. Increased bladder thickening versus underdistention. Correlate with urinalysis for cystitis. 5. Constipation and diverticulosis. 6. Mild hepatic steatosis. 7. Umbilical fat hernia. 8. Trace low-density fluid in the pelvic cul-de-sac to the right, typically physiologic at this age. Electronically Signed   By: Almira Bar M.D.   On: 09/26/2022 20:44    Pending  Labs Unresulted Labs (From admission, onward)     Start     Ordered   09/27/22 1143  Rapid urine drug screen (hospital performed)  ONCE - STAT,   STAT        09/27/22 1143   09/27/22 1143  Urinalysis, Routine w reflex microscopic -Urine, Clean Catch  Once,   URGENT       Question:  Specimen Source  Answer:  Urine, Clean Catch   09/27/22 1143            Vitals/Pain Today's Vitals   09/27/22 1005 09/27/22 1008 09/27/22 1209 09/27/22 1230  BP:  (!) 171/128 131/82 129/68  Pulse:  99  91  Resp:  18  17  Temp: 97.7 F (36.5 C)     TempSrc: Oral     SpO2:  100%    Weight:      Height:      PainSc:        Isolation Precautions No active isolations  Medications Medications  metoCLOPramide (REGLAN) injection 10 mg (has no administration in time range)  pantoprazole (PROTONIX) injection 40 mg (40 mg Intravenous Given 09/27/22 1206)  0.45 % sodium chloride infusion ( Intravenous New Bag/Given 09/27/22 1306)  hydrALAZINE (APRESOLINE) injection 20 mg (has no administration in time range)  prochlorperazine (COMPAZINE) injection 10 mg (has no administration in time range)  magnesium sulfate IVPB 2 g 50 mL (2 g Intravenous New Bag/Given 09/27/22 1307)  lactated ringers bolus 1,000 mL (0 mLs Intravenous Stopped 09/27/22 0958)  ondansetron (ZOFRAN) injection 4 mg (4 mg Intravenous Given 09/27/22 0723)  morphine (PF) 4 MG/ML injection 4 mg (4 mg Intravenous Given 09/27/22 0759)  diphenhydrAMINE (BENADRYL) injection 12.5 mg (12.5 mg Intravenous Given 09/27/22 0758)  HYDROmorphone (DILAUDID) injection 1 mg (1 mg Intravenous Given 09/27/22 0906)  HYDROmorphone (DILAUDID) injection 1 mg (1 mg Intravenous Given 09/27/22 1131)  diphenhydrAMINE (BENADRYL) injection 25 mg (25 mg Intravenous Given 09/27/22 1208)  metoprolol tartrate (LOPRESSOR) injection 5 mg (5 mg Intravenous Given 09/27/22 1215)  hydrALAZINE (APRESOLINE) injection 20 mg (20 mg Intravenous  Given 09/27/22 1209)    Mobility walks  A&Ox4

## 2022-09-27 NOTE — Progress Notes (Signed)
Attempted Echocardiogram, patient refused exam until she speaks with the Doctor.

## 2022-09-27 NOTE — ED Notes (Signed)
Pt given shasta cola and is eating her own gluten free crackers for PO challenge requested by MD

## 2022-09-27 NOTE — Consult Note (Signed)
Consultation  Referring Provider:   [facility] Dr. Robb Matar Primary Care Physician:  Pcp, No Primary Gastroenterologist:  [facility] (only seen by [facility] as inpatient consultation)         Reason for Consultation: Hematemesis and AB pain   DOA: 09/27/2022         Hospital Day: 1         HPI:   [name] is a 27 y.o. female with past medical history significant for SLE does not have established rheumatologist here has been on Plaquenil, endometriosis with previous history of exploratory laparotomy, chronic gastritis, chronic pain related to endometriosis currently on continuous birth control without menstrual periods the past year, history of PE 2022, iron deficiency anemia and folate deficiency 3.7 previously 04/2022.   Patient has extensive abdominal history partially secondary to endometriosis since she had since she was much younger.  The majority of her care has been in [place] in [place]. See details below  Patient has history of chronic pain, drug screen here showed positive benzos otherwise unremarkable, patient is on alprazolam 1 mg as needed. Patient normally takes NSAIDs or nothing for her pain control, last opioid prescription #120 02/2022 oxycodone 7.5 mg 120 most recently this past hospitalization 09/25/2022 oxycodone 5 mg 20 tablets total but patient states she has been unable to take those outpatient due to nausea and vomiting.  Patient had recent admission 09/24/2022 for abdominal pain, nausea with coffee-ground emesis and bright red, was taking increased ibuprofen for abdominal pain at that time. 09/24/2022 EGD with Dr. Barron Alvine showed completely unremarkable esophagus, gastritis and duodenum no biopsies were taken Patient was discharged home on PPI but she states she was never able to get this filled.  Patient is also taking promethazine every 6 hours but states she was unable to hold this down as well. On discharge patient was advised to follow-up with GYN  and PCP and pain management referral.  Presents to the ER with abdominal pain, nausea vomiting and hematemesis.  Work up notable for Hgb 11.3, MCV 75.1, platelets 366, lipase 29, negative hCG, BUN 8, creatinine 0.81 with no elevation of BUN, normal liver function normal electrolytes, unremarkable urine. Repeat CBC shows WBC 12.7, Hgb 11.3, MCV 74.1 Troponin was 6 then increased to 61-pending echocardiogram. 09/04 CT angio abdomen pelvis with and without contrast showed no arterial venous phase contrast in GI tract, increased groundglass opacity posterior basal right lower lobe consistent with asymmetrical atelectasis, pneumonia or aspiration, chest x-ray unremarkable.  Increased bladder thickening versus underdistention.  Constipation and diverticulosis.  Mild hepatic steatosis, mild fat hernia and trace low-density fluid in pelvic cul-de-sac to the right. No family was present at the time of my evaluation.  Patient states she can continue to have abdominal pain on discharge and he got progressively worse.  Describes her endometriosis pain as more pelvic and the pain that she is currently having which has been disabling has been lower abdominal discomfort with some radiation around to her lower back.  She uses heating pad to help otherwise has had no relief. Does state the pain medications she was given last time was making it bearable. Patient states she has been having continuous nausea and vomiting, denies GERD, upper abdominal discomfort, dysphagia.  She does have some dizziness and vertigo recently. States she is continue to have bloody emesis, has shown pictures on her phone which show more streaks of blood versus gross bleeding with bile or liquid appearing substance.  Patient denies fevers or chills.  Denies any recent weight loss. States her bowel movements are irregular she can go back and forth between constipation and diarrhea, denies any melena or hematochezia but has seen rectal bleeding  in the past last time was a year ago. She has ha some chest pain but intermittent, non exertional, no SOB.   Patient states she was at [facility] but was treated poorly there and so stopped went back home for several months and then returned, is finishing [employer], would like to go to campus if possible but states her chronic pain prevents her from being able to do this. Patient is on continuous birth control and states she has not had a menstrual period in over a year.  Brief narrative from care everywhere:  Not recorded Multiple benign CT AB other than constipation 07/28/2022 Hospital admission [facility] for hematemesis and abdominal pain reviewing notes showed EGD with gastritis thought to be secondary to NSAIDs recommended Protonix and Carafate and advised to wean off narcotics at that time, was given Movantik and MiraLAX for opioid-induced constipation. 05/18/2022 admission [facility] for generalized abdominal pain nausea and vomiting, negative CT head at that time. 05/01/2018 for similar admission, FOBT negative 02/28/2022 documented right ovarian surgery for mass in the notes, due to surgery and hypercoagulable state was on Eliquis for 2 months after but those taken off. 10/2021 [facility] (?Dr Elby Showers) laparoscopic surgery and told she had endometriosis in 2 tumors removed benign unclear pathology per notes in the ER where she was having abdominal pain nausea and emesis. 05/2020 similar EGD with Dr. Myrtie Neither for recurrent hematemesis, completely unremarkable no specimens collected. 12/05/2018 [facility] 2014 EGD [facility] biopsies for celiac taken I do not see pathology. 2013 EGD and colonoscopy unremarkable, diagnosed with IBS 2020 negative TTG, HLA testing 2013 but I am unable to see the report.  Abnormal ED labs: Abnormal Labs Reviewed  BASIC METABOLIC PANEL - Abnormal; Notable for the following  components:      Result Value   CO2 20 (*)    Glucose, Bld 114 (*)    Calcium 8.8 (*)    All other components within normal limits  CBC - Abnormal; Notable for the following components:   WBC 12.7 (*)    Hemoglobin 11.3 (*)    HCT 34.9 (*)    MCV 74.1 (*)    MCH 24.0 (*)    RDW 16.4 (*)    All other components within normal limits  TROPONIN I (HIGH SENSITIVITY) - Abnormal; Notable for the following components:   Troponin I (High Sensitivity) 61 (*)    All other components within normal limits    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 Family History:  Her family history includes Autism in her  brother; Breast cancer in an other family member; Cancer in her father, maternal grandmother, and another family member; Diabetes in her father; Hypercholesterolemia in her mother; Hypertension in her father and mother; Rheum arthritis in her mother. Social History:   reports that she has never smoked. She has never used smokeless tobacco. She reports that she does not drink alcohol and does not use drugs.  Prior to Admission medications   Medication Sig Start Date End Date Taking? Authorizing Provider  acetaminophen (TYLENOL) 500 MG tablet Take 1,000 mg by mouth as needed for moderate pain.   Yes [provider]   ALPRAZolam Prudy Feeler) 1 MG tablet Take 1.5 mg by mouth 2 (two) times daily.   Yes [provider]  ASHWAGANDHA PO Take 2 tablets by mouth in the morning, at noon, and at bedtime.   Yes [provider]  diphenhydramine-acetaminophen (TYLENOL PM) 25-500 MG TABS tablet Take 3-4 tablets by mouth at bedtime.   Yes [provider]  Goserelin Acetate (ZOLADEX [place]) Inject 1 Device into the skin every 3 (three) months.   Yes [provider]  hydroxychloroquine (PLAQUENIL) 200 MG tablet Take 400 mg by mouth at bedtime.   Yes [provider]  ibuprofen (ADVIL) 200 MG tablet Take 800-1,000 mg by mouth as needed for moderate pain.   Yes [provider]  Melatonin 10 MG CHEW Chew 20-30 mg by mouth at bedtime as needed (sleep).   Yes [provider]  norethindrone (AYGESTIN) 5 MG tablet Take 2 tablets (10 mg total) by mouth at bedtime. 09/25/22 10/25/22 Yes Dorcas Carrow, MD  oxyCODONE (OXY IR/ROXICODONE) 5 MG immediate release tablet Take 1 tablet (5 mg total) by mouth every 6 (six) hours as needed for moderate pain. 09/25/22  Yes Dorcas Carrow, MD  promethazine (PHENERGAN) 25 MG tablet Take 1 tablet (25 mg total) by mouth every 6 (six) hours as needed for nausea or vomiting. Patient taking differently: Take 50 mg by mouth every 6 (six) hours as needed for nausea or vomiting. 09/25/22 10/25/22 Yes Ghimire, Lyndel Safe, MD  zolpidem (AMBIEN) 10 MG tablet Take 20 mg by mouth at bedtime.   Yes [provider]  DULoxetine (CYMBALTA) 30 MG capsule Take 30 mg by mouth daily. Patient not taking: Reported on 09/27/2022    [provider]  pantoprazole (PROTONIX) 20 MG tablet Take 1 tablet (20 mg total) by mouth daily. Patient not taking: Reported on 09/27/2022 09/25/22 10/25/22  Dorcas Carrow, MD  sucralfate (CARAFATE) 1 g tablet Take 1 g by mouth 4 (four) times daily -  with meals and at bedtime. Patient not taking: Reported on 09/27/2022    [provider]  dicyclomine (BENTYL) 20 MG tablet Take 1 tablet (20 mg total) by mouth 2 (two) times daily. 11/29/18 04/11/19  Muthersbaugh, Dahlia Client, PA-C  Fluticasone Propionate HFA (FLOVENT HFA IN) Inhale into the lungs. Patient not taking: Reported on 11/14/2019  11/14/19  [provider]    Current Facility-Administered Medications  Medication Dose Route Frequency Provider Last Rate Last Admin   0.45 % sodium chloride infusion   Intravenous Continuous Bobette Mo, MD   Paused at 09/27/22 1317   hydrALAZINE (APRESOLINE) injection 20 mg  20 mg Intravenous Q4H PRN Bobette Mo, MD       magnesium sulfate IVPB 2 g 50 mL  2 g Intravenous Once Bobette Mo, MD 50 mL/hr at 09/27/22 1307 2 g at 09/27/22 1307   metoCLOPramide (REGLAN) injection 10 mg  10 mg Intravenous Once Loetta Rough, MD       pantoprazole (PROTONIX) injection 40 mg  40 mg Intravenous Q12H Bobette Mo, MD   40 mg at 09/27/22 1206   prochlorperazine (COMPAZINE) injection 10 mg  10 mg Intravenous Q6H PRN Bobette Mo, MD        Allergies as of 09/27/2022 - Review Complete 09/27/2022  Allergen Reaction Noted   Amoxicillin Anaphylaxis, Swelling, and Other (See Comments) 08/05/2015   Azithromycin Anaphylaxis and Other (See Comments) 11/28/2018   Fish-derived products Anaphylaxis and Other (See Comments) 05/18/2022   Peanut-containing drug products Hives, Itching, Rash, and Other (See Comments) 11/28/2018   Gluten meal Itching, Rash, and Other (See Comments) 12/30/2019   Iodinated contrast media Hives and Itching 11/13/2019   Other Hives, Itching, Rash, and Other (See Comments) 10/22/2019   Iodine Itching 11/18/2021   Lactose intolerance (gi) Nausea And Vomiting and Other (See Comments) 05/30/2020    Review of Systems:    Constitutional: No weight loss, fever, chills, weakness or fatigue HEENT: Eyes: No change in vision               Ears, Nose, Throat:  No change in hearing or  congestion Skin: No rash or itching Cardiovascular: No chest pain, chest pressure or palpitations   Respiratory: No SOB or cough Gastrointestinal: See HPI and otherwise negative Genitourinary: No dysuria or change in urinary frequency Neurological: No headache, dizziness or syncope Musculoskeletal: No new muscle or joint pain Hematologic: No bleeding or bruising Psychiatric: No history of depression or anxiety     Physical Exam:  Vital signs in last 24 hours: Temp:  [97.6 F (36.4 C)-98.4 F (36.9 C)] 97.7 F (36.5 C) (09/05 1005) Pulse Rate:  [91-139] 91 (09/05 1230) Resp:  [17-23] 17 (09/05 1230) BP: (129-180)/(68-128) 129/68 (09/05 1230) SpO2:  [90 %-100 %] 100 % (09/05 1008) Weight:  [108.9 kg] 108.9 kg (09/05 0609)   Last BM recorded by nurses in past 5 days No data recorded  General:   Pleasant, obese female no acute distress but states she is in extreme discomfort Head:  Normocephalic and atraumatic. Eyes: sclerae anicteric,conjunctive pink  Heart:  Pulm: Clear anteriorly; no wheezing Abdomen:  Soft, Obese AB, well-healed laparoscopic surgical scars, Sluggish bowel sounds. Mild to moderate tenderness in the lower abdomen and slightly diffuse, negative carrnet'sWithout guarding and Without rebound, No organomegaly appreciated. Extremities:  Without edema. Msk:  Symmetrical without gross deformities. Peripheral pulses intact.  Neurologic:  Alert and  oriented x4;  No focal deficits.  Skin:   Dry and intact without significant lesions or rashes. Psychiatric:  Cooperative. Normal mood and affect.  LAB RESULTS: Recent Labs    09/25/22 0559 09/26/22 1611 09/27/22 0639  WBC 10.0 10.2 12.7*  HGB 10.7* 11.3* 11.3*  HCT 33.4* 35.5* 34.9*  PLT 337 366 374   BMET Recent Labs    09/25/22 0559 09/26/22 1900 09/27/22 0639  NA 138 140 137  K 3.3* 3.6 4.7  CL 105 105 105  CO2 20* 21* 20*  GLUCOSE 82 125* 114*  BUN 6 8 7   CREATININE 0.90 0.81 0.90  CALCIUM 8.7* 9.1  8.8*   LFT Recent Labs    09/27/22 1140  PROT 7.3  ALBUMIN 4.0  AST 20  ALT 15  ALKPHOS 73  BILITOT 0.4  BILIDIR 0.1  IBILI 0.3   PT/INR No results for input(s): "LABPROT", "INR" in the last 72 hours.  STUDIES: DG Chest 2 View  Result Date: 09/27/2022 CLINICAL DATA:  27 year old female with history of chest pain. Emesis. EXAM: CHEST - 2 VIEW COMPARISON:  Chest x-ray 12/28/2021. FINDINGS: Lung volumes are normal. No consolidative airspace disease. No pleural effusions. No pneumothorax. No pulmonary nodule or mass noted. Pulmonary vasculature and the cardiomediastinal silhouette are within normal limits. IMPRESSION: No radiographic evidence of acute cardiopulmonary disease. Electronically Signed   By: Trudie Reed M.D.   On: 09/27/2022 06:53   CT Angio Abd/Pel W and/or Wo Contrast  Result Date: 09/26/2022 CLINICAL DATA:  27 year old female with hematemesis, upper GI bleed suspected. EXAM: CTA ABDOMEN AND PELVIS WITHOUT AND WITH CONTRAST TECHNIQUE: Multidetector CT imaging of the abdomen and pelvis was performed using the standard protocol during bolus administration of intravenous contrast. Multiplanar reconstructed images and MIPs were obtained and reviewed to evaluate the vascular anatomy. RADIATION DOSE REDUCTION: This exam was performed according to the departmental dose-optimization program which includes automated exposure control, adjustment of the mA and/or kV according to patient size and/or use of iterative reconstruction technique. CONTRAST:  75mL OMNIPAQUE IOHEXOL 350 MG/ML SOLN COMPARISON:  CTA chest, abdomen and pelvis 09/23/2022, CT abdomen and pelvis no contrast 02/12/2022. FINDINGS: VASCULAR Aorta: Normal. Celiac: Normal. SMA: Normal. Renals: Single artery supplies the right kidney. 2 arteries arising in close tandem supply the left kidney with the dominant artery more cephalad in takeoff. All 3 are normal. No branch occlusion is evident. IMA: Very small in caliber but no  evidence of aneurysm, dissection, vasculitis or flow-limiting stenosis. Inflow: Normal. Proximal Outflow: Bilateral common femoral and visualized portions of the superficial and profunda femoral arteries are patent without evidence of aneurysm, dissection, vasculitis or significant stenosis. Veins: Patent. Review of the MIP images confirms the above findings. NON-VASCULAR Lower chest: The cardiac size is normal. There is increased ground-glass opacity in the posterior basal right lower lobe consistent with asymmetric atelectasis, pneumonia or aspiration. The lung bases are otherwise clear. Chronic asymmetric elevation right hemidiaphragm. Hepatobiliary: Again noted is a 1.4 cm cyst superiorly in segment 8 of the liver, Hounsfield density is 16. No follow-up imaging is required. The liver is mildly steatotic measuring 18.3 cm in length without further focal abnormality. Again noted surgical absence of the gallbladder no biliary dilatation. Pancreas: No abnormality. Spleen: No abnormality.  No splenomegaly. Adrenals/Urinary Tract: Adrenal glands are unremarkable. Kidneys are normal, without renal calculi, focal lesion, or hydronephrosis. Bladder is increasingly thickened but not fully distended. Correlate with urinalysis for possible cystitis. No imaging findings of ascending UTI. Stomach/Bowel: No arterial or venous phase contrast extravasation into the GI tract is seen. No bowel obstruction or inflammation is evident. 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 There is mild-to-moderate fecal stasis. Scattered sigmoid diverticulosis without diverticulitis. Lymphatic: No adenopathy is seen. Reproductive: Uterus and bilateral adnexa are unremarkable. Scattered pelvic phleboliths. Other: Small umbilical fat hernia. No incarcerated hernia. No free hemorrhage, free air or focal inflammatory process. There is trace low-density fluid in the pelvic cul-de-sac to the right, typically physiologic at this age, and unchanged.  No further free fluid. Musculoskeletal: No acute or significant osseous findings. Slight lumbar dextroscoliosis possibly positional. IMPRESSION: 1. No arterial or venous phase contrast extravasation into the GI tract is seen. 2. No aortic aneurysm, dissection or other CTA abnormality. 3. Increased ground-glass opacity in the posterior basal right lower lobe consistent with asymmetric atelectasis, pneumonia or aspiration. 4. Increased bladder thickening versus underdistention. Correlate with urinalysis for cystitis. 5. Constipation and diverticulosis. 6. Mild hepatic steatosis. 7. Umbilical fat hernia.  8. Trace low-density fluid in the pelvic cul-de-sac to the right, typically physiologic at this age. Electronically Signed   By: Almira Bar M.D.   On: 09/26/2022 20:44      Impression/Plan   27 year old female with extensive history of endometriosis status post exploratory laparotomies, systemic lupus with history of PE on and off Eliquis currently not on it, celiac per patient currently on gluten-free diet, possible history of pericardial effusion/pericarditis per patient, with chronic lower abdominal pain and nausea vomiting.  From extensive chart review patient's had repeated admissions with recurrent nausea, vomiting and lower abdominal discomfort.  Hematemesis with nausea and vomiting Most recent EGD 09/03 for hematemesis was completely unremarkable no biopsies were taken.  Has had repeated EGDs in the past that were unremarkable Patient never picked up PPI Stable hemoglobin at 11.3, recheck 11.5 At this time with stable hemoglobin this most likely represents potential Mallory-Weiss tear, esophagitis from repeated nausea and vomiting we have no plan for endoscopic evaluation at this time. -Suggest PPI Protonix 40 mg twice daily -Control nausea and vomiting with Phenergan patient states this does better, Reglan as needed -Any continuing drop in hemoglobin or gross GI bleedng will consider repeat  endoscopic evaluation  Lower abdominal pain-question from endometriosis, appeared induced constipation, IBS, unknown but does appear to be more chronic CT abdomen pelvis unremarkable other than constipation which reviewing multiple CT abdomen pelvis is patient has had repeatedly.   --Has been on Movantik in the past and likely worsened with constipation use.  May want to do trial of Linzess for possible IBS to also with pain. --Would want to avoid oral NSAIDs with hematemesis, nausea and vomiting but could consider trial of IV Toradol if no contraindications - pain control per primary  chronic microcytic anemia with history of IDA and folate deficiency Colonoscopy 2020, unable to see report patient's had repeated CT abdomen pelvis without inflammation On continuous BCP, not having menstrual periods ? Anemia of chronic disease with SLE -Recheck folate and iron/ferritin  Elevated troponin 6--> 61 Pending repeat Has had some chest discomfort CT with possible aspiration pneumonia but no no fevers, very mild leukocytosis Cardiology consult pending, echocardiogram pending Per patient has potentially had pleural effusion or pericarditis in the past  Systemic lupus with hypercoagulable state history of PE 09/01 CTA negative Patient currently not on anticoagulation Not currently on treatment for her lupus.  History of depression anxiety Uncertain how this plays a role but would suggest potentially following up with someone outpatient.  Principal Problem:   Intractable nausea and vomiting Active Problems:   Chronic anemia   Asthma   Obesity, Class III, BMI 40-49.9 (morbid obesity) (HCC)    LOS: 0 days    Thank you for your kind consultation, we will continue to follow.   Doree Albee  09/27/2022, 1:47 PM

## 2022-09-27 NOTE — ED Provider Notes (Signed)
[facility] AT [facility] Provider Note   CSN: [id number] Arrival date & time: 09/27/22  2536     History  Chief Complaint  Patient presents with   Hematemesis   Abdominal Pain    [name] is a 27 y.o. female with PMH as listed below who presents with vomiting blood since 9/1 as well as 10/10 abdominal pain that radiates up to her chest. Pt reports she has not been able to eat due to N/V. Per documentation, patient was admitted from 9/1-9/3 after she was evaluated by GI and had endoscopy that did not show any evidence of gastric disease. Had CTA that did not reveal any evidence of bleeding. Hgb was stable. Abd pain/N/V thought to be due to her endometriosis. Taking progesterone and given short course of pain meds. Advised to DC NSAIDs and start taking protonix. Reported pink emesis multiple times at home.  She states that her symptoms have not improved since her admission to the hospital and she has continued to have hematemesis with abdominal pain.  She states she thinks she was discharged too early.   She denies urinary/vaginal symptoms, diarrhea/constipation but states she hasn't had a bowel movement in a few days d/t lack of PO intake. She has h/o PE but is not on anticoagulation.  She has a history of a ovarian tumor resection as well as surgeries for endometriosis and states she has a known history of bowel Endometriosis.  She also states she read the CT report from the other day which she states said that she had pneumonia but she was never treated for it.  She states she knows she is aspirating the vomit and is at times coughing up vomitus but she denies any cough when she is not vomiting and has not coughing up any mucus.  She endorses intermittent fevers at home Tmax 100 F. She does endorse generalized chest and abdominal pain that is very severe and constant, not exertional. Patient does have a bag of pink emesis at bedside that is similar in  appearance to the bag of emesis that she brought at yesterday's visit, the picture of which is located in the media tab.  Per chart review, CTA abd pelvis from 09/26/22 body of report reads: Lower chest: The cardiac size is normal. There is increased ground-glass opacity in the posterior basal right lower lobe consistent with asymmetric atelectasis, pneumonia or aspiration. The lung bases are otherwise clear. Chronic asymmetric elevation right hemidiaphragm.    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 Authorizing Provider  ALPRAZolam Prudy Feeler) 1 MG tablet Take 1.5 mg by mouth 2 (two) times daily.    [provider]  Goserelin Acetate (ZOLADEX [place]) Inject 1 Device into the skin every 3 (three) months.    [provider]  hydroxychloroquine (PLAQUENIL) 200 MG tablet Take 400 mg by mouth at bedtime.    [provider]  norethindrone (AYGESTIN) 5 MG  tablet Take 2 tablets (10 mg total) by mouth at bedtime. 09/25/22 10/25/22  Dorcas Carrow, MD  oxyCODONE (OXY IR/ROXICODONE) 5 MG immediate release tablet Take 1 tablet (5 mg total) by mouth every 6 (six) hours as needed for moderate pain. 09/25/22   Dorcas Carrow, MD  pantoprazole (PROTONIX) 20 MG tablet Take 1 tablet (20 mg total) by mouth daily. 09/25/22 10/25/22  Dorcas Carrow, MD  promethazine (PHENERGAN) 25 MG tablet Take 1 tablet (25 mg total) by mouth every 6 (six) hours as needed for nausea or vomiting. 09/25/22 10/25/22  Dorcas Carrow, MD  zolpidem (AMBIEN) 10 MG tablet Take 20  mg by mouth at bedtime.    [provider]  dicyclomine (BENTYL) 20 MG tablet Take 1 tablet (20 mg total) by mouth 2 (two) times daily. 11/29/18 04/11/19  Muthersbaugh, Dahlia Client, PA-C  Fluticasone Propionate HFA (FLOVENT HFA IN) Inhale into the lungs. Patient not taking: Reported on 11/14/2019  11/14/19  [provider]      Allergies    Amoxicillin, Azithromycin, Fish-derived products, Peanut-containing drug products, Gluten meal, Iodinated contrast media, Other, Iodine, and Lactose intolerance (gi)    Review of Systems   Review of Systems A 10 point review of systems was performed and is negative unless otherwise reported in HPI.  Physical Exam Updated Vital Signs BP (!) 171/128   Pulse 99   Temp 97.7 F (36.5 C) (Oral)   Resp 18   Ht 5\' 2"  (1.575 m)   Wt 108.9 kg   SpO2 100%   BMI 43.90 kg/m  Physical Exam General: Normal appearing obese female, lying in bed.  HEENT: Sclera anicteric, MMM, trachea midline.  Cardiology: regular tachycardic rate, no murmurs/rubs/gallops.  Resp: Normal respiratory rate and effort. CTAB, no wheezes, rhonchi, crackles.  Abd: Soft, diffusely tender, non-distended. No rebound tenderness or guarding.  GU: Deferred. MSK: No peripheral edema or signs of trauma.  Skin: warm, dry.  Neuro: A&Ox4, CNs II-XII grossly intact. MAEs. Sensation grossly intact.  Psych: Normal mood and affect.   ED Results / Procedures / Treatments   Labs (all labs ordered are listed, but only abnormal results are displayed) Labs Reviewed  BASIC METABOLIC PANEL - Abnormal; Notable for the following components:      Result Value   CO2 20 (*)    Glucose, Bld 114 (*)    Calcium 8.8 (*)    All other components within normal limits  CBC - Abnormal; Notable for the following components:   WBC 12.7 (*)    Hemoglobin 11.3 (*)    HCT 34.9 (*)    MCV 74.1 (*)    MCH 24.0 (*)    RDW 16.4 (*)    All other components within normal limits  TROPONIN I (HIGH  SENSITIVITY) - Abnormal; Notable for the following components:   Troponin I (High Sensitivity) 61 (*)    All other components within normal limits  HCG, SERUM, QUALITATIVE  LIPASE, BLOOD  HEPATIC FUNCTION PANEL  RAPID URINE DRUG SCREEN, HOSP PERFORMED  URINALYSIS, ROUTINE W REFLEX MICROSCOPIC  TROPONIN I (HIGH SENSITIVITY)    EKG EKG Interpretation Date/Time:  Thursday September 27 2022 06:07:21 EDT Ventricular Rate:  124 PR Interval:  138 QRS Duration:  95 QT Interval:  335 QTC Calculation: 482 R Axis:   23  Text Interpretation: Sinus tachycardia LAE, consider biatrial enlargement Borderline prolonged QT interval When compared with ECG of 09/26/2022, No significant change was found Confirmed by Dione Booze ([id number]) on 09/27/2022 6:18:54 AM  Radiology DG Chest 2 View  Result Date: 09/27/2022 CLINICAL DATA:  27 year old female with history of chest pain. Emesis. EXAM: CHEST - 2 VIEW COMPARISON:  Chest x-ray 12/28/2021. FINDINGS: Lung volumes are normal. No consolidative airspace disease. No pleural effusions. No pneumothorax. No pulmonary nodule or mass noted. Pulmonary vasculature and the cardiomediastinal silhouette are within normal limits. IMPRESSION: No radiographic evidence of acute cardiopulmonary disease. Electronically Signed   By: Trudie Reed M.D.   On: 09/27/2022 06:53   CT Angio Abd/Pel W and/or Wo Contrast  Result Date: 09/26/2022 CLINICAL DATA:  27 year old female with hematemesis, upper GI bleed suspected. EXAM: CTA ABDOMEN AND PELVIS WITHOUT AND WITH CONTRAST TECHNIQUE: Multidetector CT imaging of the abdomen and pelvis was performed using the standard protocol during bolus administration of intravenous contrast. Multiplanar reconstructed images and MIPs were obtained and reviewed to evaluate the vascular anatomy. RADIATION DOSE REDUCTION: This exam was performed according to the departmental dose-optimization program which includes automated exposure control, adjustment  of the mA and/or kV according to patient size and/or use of iterative reconstruction technique. CONTRAST:  75mL OMNIPAQUE IOHEXOL 350 MG/ML SOLN COMPARISON:  CTA chest, abdomen and pelvis 09/23/2022, CT abdomen and pelvis no contrast 02/12/2022. FINDINGS: VASCULAR Aorta: Normal. Celiac: Normal. SMA: Normal. Renals: Single artery supplies the right kidney. 2 arteries arising in close tandem supply the left kidney with the dominant artery more cephalad in takeoff. All 3 are normal. No branch occlusion is evident. IMA: Very small in caliber but no evidence of aneurysm, dissection, vasculitis or flow-limiting stenosis. Inflow: Normal. Proximal Outflow: Bilateral common femoral and visualized portions of the superficial and profunda femoral arteries are patent without evidence of aneurysm, dissection, vasculitis or significant stenosis. Veins: Patent. Review of the MIP images confirms the above findings. NON-VASCULAR Lower chest: The cardiac size is normal. There is increased ground-glass opacity in the posterior basal right lower lobe consistent with asymmetric atelectasis, pneumonia or aspiration. The lung bases are otherwise clear. Chronic asymmetric elevation right hemidiaphragm. Hepatobiliary: Again noted is a 1.4 cm cyst superiorly in segment 8 of the liver, Hounsfield density is 16. No follow-up imaging is required. The liver is mildly steatotic measuring 18.3 cm in length without further focal abnormality. Again noted surgical absence of the gallbladder no biliary dilatation. Pancreas: No abnormality. Spleen: No abnormality.  No splenomegaly. Adrenals/Urinary Tract: Adrenal glands are unremarkable. Kidneys are normal, without renal calculi, focal lesion, or hydronephrosis. Bladder is increasingly thickened but not fully distended. Correlate with urinalysis for possible cystitis. No imaging findings of ascending UTI. Stomach/Bowel: No arterial or venous phase contrast extravasation into the GI tract is seen. No  bowel obstruction or inflammation is evident. 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 There is mild-to-moderate fecal stasis. Scattered sigmoid diverticulosis without diverticulitis. Lymphatic: No adenopathy is seen. Reproductive: Uterus and bilateral adnexa are unremarkable. Scattered pelvic phleboliths. Other: Small umbilical fat hernia. No incarcerated hernia. No free hemorrhage, free air or focal inflammatory process. There is trace low-density fluid in the pelvic cul-de-sac to the right, typically physiologic at this age, and unchanged. No further free fluid. Musculoskeletal: No acute or significant osseous findings. Slight lumbar dextroscoliosis possibly positional. IMPRESSION: 1. No arterial or venous phase contrast extravasation into the GI tract is seen. 2. No aortic aneurysm, dissection or other CTA abnormality. 3. Increased ground-glass opacity in the posterior basal right lower lobe consistent with asymmetric atelectasis, pneumonia or aspiration. 4. Increased bladder thickening versus underdistention. Correlate with urinalysis for cystitis. 5. Constipation and diverticulosis. 6. Mild  hepatic steatosis. 7. Umbilical fat hernia. 8. Trace low-density fluid in the pelvic cul-de-sac to the right, typically physiologic at this age. Electronically Signed   By: Almira Bar M.D.   On: 09/26/2022 20:44    Procedures .Critical Care  Performed by: Loetta Rough, MD Authorized by: Loetta Rough, MD   Critical care provider statement:    Critical care time (minutes):  30   Critical care was necessary to treat or prevent imminent or life-threatening deterioration of the following conditions:  Dehydration   Critical care was time spent personally by me on the following activities:  Development of treatment plan with patient or surrogate, discussions with consultants, evaluation of patient's response to treatment, examination of patient, ordering and review of laboratory studies, ordering and  review of radiographic studies, ordering and performing treatments and interventions, pulse oximetry, re-evaluation of patient's condition, review of old charts and obtaining history from patient or surrogate   Care discussed with: admitting provider       Medications Ordered in ED Medications  metoCLOPramide (REGLAN) injection 10 mg (has no administration in time range)  diphenhydrAMINE (BENADRYL) injection 25 mg (has no administration in time range)  pantoprazole (PROTONIX) injection 40 mg (has no administration in time range)  metoprolol tartrate (LOPRESSOR) injection 5 mg (has no administration in time range)  0.45 % sodium chloride infusion (has no administration in time range)  hydrALAZINE (APRESOLINE) injection 20 mg (has no administration in time range)  hydrALAZINE (APRESOLINE) injection 20 mg (has no administration in time range)  lactated ringers bolus 1,000 mL (0 mLs Intravenous Stopped 09/27/22 0958)  ondansetron (ZOFRAN) injection 4 mg (4 mg Intravenous Given 09/27/22 0723)  morphine (PF) 4 MG/ML injection 4 mg (4 mg Intravenous Given 09/27/22 0759)  diphenhydrAMINE (BENADRYL) injection 12.5 mg (12.5 mg Intravenous Given 09/27/22 0758)  HYDROmorphone (DILAUDID) injection 1 mg (1 mg Intravenous Given 09/27/22 0906)  HYDROmorphone (DILAUDID) injection 1 mg (1 mg Intravenous Given 09/27/22 1131)    ED Course/ Medical Decision Making/ A&P                          Medical Decision Making Amount and/or Complexity of Data Reviewed Labs: ordered. Decision-making details documented in ED Course. Radiology: ordered.  Risk Prescription drug management. Decision regarding hospitalization.    This patient presents to the ED for concern of abd pain, N/V, this involves an extensive number of treatment options, and is a complaint that carries with it a high risk of complications and morbidity.  I considered the following differential and admission for this acute, potentially life threatening  condition.   MDM:    For DDX for abdominal pain includes but is not limited to:  Abdominal exam without peritoneal signs. No evidence of acute abdomen or gastric perforation at this time.   Of course must consider PUD or mallory weiss tear/boerhaave's syndrome w/ abd pain, N/V, hematemesis. She was just scoped a few days ago for the same symptoms which was negative, no source of bleeding identified, and she states her symptoms are unchanged from this. Overall low suspicion and there is no c/f GI hemorrhage w/ stable Hgb. She is tachycardic into 130s then 110s, will give LR for possible volume depletion w/ decreased PO intake. Afebrile, overall well-appearing, lower c/f SIRS/sepsis. She has no specific electrolyte abnormalities. Low suspicion for acute hepatobiliary disease (including acute cholecystitis or cholangitis). Patient had CTA of abdomen yesterday that showed no vascular catastrophe, no ileus/SBO,  no appendicitis or diverticulitis. She has negative lipase today and no pancreatitis. She does have possible aspiration/PNA in RLL on CT from yesterday but CXR here today is clear, she has no fever or any cough when not vomiting to indicate symptomatic pneumonia. Also no air under diaphragm on CXR. She had complained of chest pain in triage, states her abdominal pain radiates into her chest, she did not have any AAA or dissection on her scan yesterday, no PE on her scan a couple of days ago, but consider atypical ACS or myocarditis. She does have a troponin leak of 6--> 61. Patient is also noted to be hypertensive, 171/128, she does not take any antihypertensives at home. This persists despite pain and nausea/vomiting control. D/w cardiology and possible her trop leak is due to hypertensive emergency and will treat w/ hydralazine IV.   Patient w/ hypertension and trop leak, requiring multiple doses of IV pain/nausea meds, failed PO challenge in ED twice. She is admitted to hospitalist Dr. Robb Matar. Will  possibly involve GI again. Pending UA/UDS.     Clinical Course as of 09/27/22 1151  Thu Sep 27, 2022  0709 Hemoglobin(!): 11.3 Stable hgb [HN]  0919 Patient with mild leukocytosis 12.7, Co2 20, otherwise reassuring labs. Requesting more pain medication.  [HN]  1005 Troponin I (High Sensitivity)(!): 61 6-->61. Elevated troponin, will consult w/ cardiology.  [HN]  1114 Patient  [HN]    Clinical Course User Index [HN] Loetta Rough, MD    Labs: I Ordered, and personally interpreted labs.  The pertinent results include:  those listed above  Imaging Studies ordered: I ordered imaging studies including CXR I independently visualized and interpreted imaging. I agree with the radiologist interpretation  Additional history obtained from chart review.   Cardiac Monitoring: The patient was maintained on a cardiac monitor.  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   Collagen vascular disease (HCC)    Diarrhea    Patient mentions diagnosis of ulcerative colitis but it is not clear she actually has UC   Endometriosis    Lupus (HCC)    Ovarian cyst    Panic disorder 05/30/2020   Pulmonary embolus (HCC) 02/06/2020   Seizures (HCC)      Medicines Meds ordered this encounter  Medications   lactated ringers bolus 1,000 mL   ondansetron (ZOFRAN) injection 4 mg   morphine (PF) 4 MG/ML injection 4 mg   diphenhydrAMINE (BENADRYL) injection 12.5 mg   HYDROmorphone (DILAUDID) injection 1 mg   HYDROmorphone (DILAUDID) injection 1 mg    DISCONTD: prochlorperazine (COMPAZINE) injection 10 mg   metoCLOPramide (REGLAN) injection 10 mg   diphenhydrAMINE (BENADRYL) injection 25 mg   pantoprazole (PROTONIX) injection 40 mg   metoprolol tartrate (LOPRESSOR) injection 5 mg   0.45 % sodium chloride infusion   hydrALAZINE (APRESOLINE) injection 20 mg   hydrALAZINE (APRESOLINE) injection 20 mg   DISCONTD: hydrALAZINE (APRESOLINE) injection 5 mg    I have reviewed the patients home medicines and have made adjustments as needed  Problem List / ED Course: Problem List Items Addressed This Visit  Digestive   Hematemesis with nausea   Nausea and vomiting   Other Visit Diagnoses     Generalized abdominal pain    -  Primary   Hypertensive emergency       Relevant Medications   metoprolol tartrate (LOPRESSOR) injection 5 mg   hydrALAZINE (APRESOLINE) injection 20 mg   hydrALAZINE (APRESOLINE) injection 20 mg                   This note was created using dictation software, which may contain spelling or grammatical errors.    Loetta Rough, MD 09/27/22 [phone]

## 2022-09-27 NOTE — ED Triage Notes (Signed)
Pt reports vomiting blood since 9/1 and went to [facility]. She reports that she left [place] yesterday morning. She also reports 10/10 abdominal pain that radiates up to her chest. Pt reports she has not been able to eat due to N/V.

## 2022-09-27 NOTE — ED Notes (Signed)
Pt provided urine

## 2022-09-27 NOTE — H&P (Signed)
History and Physical    Patient: [name] ZOX:[id number] DOB: 1996/01/09 DOA: 09/27/2022 DOS: the patient was seen and examined on 09/27/2022 PCP: Pcp, No  Patient coming from: Home  Chief Complaint:  Chief Complaint  Patient presents with   Hematemesis   Abdominal Pain   HPI: [name] is a 27 y.o. female with medical history significant of asthma, celiac disease, cluster B personality disorder, panic disorder, collagen vascular disease, diarrhea, endometriosis, lupus, ovarian cyst, pulmonary embolus, seizures, class III obesity who was recently admitted and discharged due to abdominal pain with intractable nausea and vomiting with hematemesis who is returning to the emergency department with similar symptoms.  She had a normal EGD 3 days ago.  She stated that her symptoms continue after discharge and got progressively worse.  She had multiple episodes of emesis yesterday with some mild hematemesis.  She has had 6 hematemesis episodes today. No diarrhea, constipation, melena or hematochezia.  No flank pain, dysuria, frequency or hematuria.He denied fever, chills, rhinorrhea, sore throat, wheezing or hemoptysis.  No chest pain, palpitations, diaphoresis, PND, orthopnea or pitting edema of the lower extremities.  No polyuria, polydipsia, polyphagia or blurred vision.   Lab work: UDS is positive for benzodiazepines.  CBC showed a white count of 12.7, hemoglobin 11.3 g/dL and platelets 811.  Serum pregnancy test negative.  Normal lipase and LFTs.  BMP showed a CO2 of 20 mmol/L with a normal anion gap.  Glucose 114 and calcium 8.9 mg/dL.  Troponin was 6 then 61 ng/L.  Imaging: 2 view chest radiograph with no acute cardiopulmonary disease.  ED course: Initial vital signs were temperature 97.6 F, pulse 139, respirations 22, BP 180/116 mmHg and O2 sat 97% on room air.  The patient received morphine 4 mg IVP, ondansetron 4 mg IVP, LR 1000 mL bolus,, hydromorphone 1 mg IVP and  diphenhydramine 37.5 mg IVP.  I added hydralazine 20 mg IVP, metoprolol 5 mg IVP and started the patient on 0.45% NaCl at 125 mL/h.   Review of Systems: As mentioned in the history of present illness. All other systems reviewed and are negative.  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   Collagen vascular disease (HCC)    Diarrhea    Patient mentions diagnosis of ulcerative colitis but it is not clear she actually has UC   Endometriosis    Lupus (HCC)    Ovarian cyst    Panic disorder 05/30/2020   Pulmonary embolus (HCC) 02/06/2020   Seizures (HCC)    Past Surgical History:  Procedure Laterality Date   APPENDECTOMY     CHOLECYSTECTOMY  2015   ESOPHAGOGASTRODUODENOSCOPY Left 09/24/2022   Procedure: ESOPHAGOGASTRODUODENOSCOPY (EGD);  Surgeon: Shellia Cleverly, DO;  Location: [facility] ENDOSCOPY;  Service: Gastroenterology;  Laterality: Left;   ESOPHAGOGASTRODUODENOSCOPY (EGD) WITH PROPOFOL N/A 06/18/2020   Procedure: ESOPHAGOGASTRODUODENOSCOPY (EGD) WITH PROPOFOL;  Surgeon: Sherrilyn Rist, MD;  Location: [facility] ENDOSCOPY;  Service: Gastroenterology;  Laterality: N/A;   EXCISION OF ENDOMETRIOMA     Ovarian cyst removal   excision of endometriosis     FOOT FRACTURE SURGERY     w/ hardware   HERNIA REPAIR     TONSILLECTOMY     Social History:  reports that she has never smoked. She has never used smokeless tobacco. She reports that she does not drink alcohol and does not use drugs.  Allergies  Allergen Reactions  Amoxicillin Anaphylaxis, Swelling and Other (See Comments)    Hand swelling (PENFAST 4+) Tolerated ceftriaxone 2021 PenFast Score 4+:  High risk of positive penicillin allergy (50%) Oral challenge NOT recommended  Cephalosporins may be considered under close monitoring   Azithromycin Anaphylaxis and Other (See Comments)   Fish-Derived Products Anaphylaxis and Other (See Comments)    Eats shellfish  alright   Peanut-Containing Drug Products Hives, Itching, Rash and Other (See Comments)         Gluten Meal Itching, Rash and Other (See Comments)    Celiac disease   Iodinated Contrast Media Hives and Itching    02/04/2020 Patient stated has history of itching and hives with CT IV Iodine contrast, premedicated with Benadryl 25 mg IV prior to CT IV Iodine scan today.Post CT scan patient reported itching, Benadryl 25 mg IV given, symptoms resolved, NP recommends for patient to get premedicated with Benadryl 50 mg IV prior to future CT IV Iodine scans.    Other Hives, Itching, Rash and Other (See Comments)    Moderna covid vaccine -Syncope - ICU 2 weeks Tree nuts- Hives, itching, rashes   Iodine Itching   Lactose Intolerance (Gi) Nausea And Vomiting and Other (See Comments)    Can tolerate, in small doses    Family History  Problem Relation Age of Onset   Hypertension Mother    Hypercholesterolemia Mother    Rheum arthritis Mother    Diabetes Father    Hypertension Father    Cancer Father    Autism Brother    Cancer Maternal Grandmother        Ovarian   Breast cancer Other        Paternal great aunt's breast cancer   Cancer Other        patneral great aunt's ovarian cancer    Prior to Admission medications   Medication Sig Start Date End Date Taking? Authorizing Provider  ALPRAZolam Prudy Feeler) 1 MG tablet Take 1.5 mg by mouth 2 (two) times daily.    [provider]  Goserelin Acetate (ZOLADEX [place]) Inject 1 Device into the skin every 3 (three) months.    [provider]  hydroxychloroquine (PLAQUENIL) 200 MG tablet Take 400 mg by mouth at bedtime.    [provider]  norethindrone (AYGESTIN) 5 MG tablet Take 2 tablets (10 mg total) by mouth at bedtime. 09/25/22 10/25/22  Dorcas Carrow, MD  oxyCODONE (OXY IR/ROXICODONE) 5 MG immediate release tablet Take 1 tablet (5 mg total) by mouth every 6 (six) hours as needed for moderate pain. 09/25/22   Dorcas Carrow, MD   pantoprazole (PROTONIX) 20 MG tablet Take 1 tablet (20 mg total) by mouth daily. 09/25/22 10/25/22  Dorcas Carrow, MD  promethazine (PHENERGAN) 25 MG tablet Take 1 tablet (25 mg total) by mouth every 6 (six) hours as needed for nausea or vomiting. 09/25/22 10/25/22  Dorcas Carrow, MD  zolpidem (AMBIEN) 10 MG tablet Take 20 mg by mouth at bedtime.    [provider]  dicyclomine (BENTYL) 20 MG tablet Take 1 tablet (20 mg total) by mouth 2 (two) times daily. 11/29/18 04/11/19  Muthersbaugh, Dahlia Client, PA-C  Fluticasone Propionate HFA (FLOVENT HFA IN) Inhale into the lungs. Patient not taking: Reported on 11/14/2019  11/14/19  [provider]    Physical Exam: Vitals:   09/27/22 0609 09/27/22 0725 09/27/22 1005 09/27/22 1008  BP: (!) 180/116 (!) 141/102  (!) 171/128  Pulse: (!) 139 (!) 116  99  Resp: (!) 22 20  18  Temp: 97.6 F (36.4 C)  97.7 F (36.5 C)   TempSrc:   Oral   SpO2: 97% 98%  100%  Weight: 108.9 kg     Height: 5\' 2"  (1.575 m)      Physical Exam Vitals and nursing note reviewed.  Constitutional:      General: She is awake. She is not in acute distress.    Appearance: She is well-developed. She is morbidly obese. She is ill-appearing. She is not toxic-appearing.  HENT:     Head: Normocephalic.     Nose: No rhinorrhea.     Mouth/Throat:     Mouth: Mucous membranes are moist.  Eyes:     General: No scleral icterus.    Pupils: Pupils are equal, round, and reactive to light.  Neck:     Vascular: No JVD.  Cardiovascular:     Rate and Rhythm: Normal rate and regular rhythm.     Heart sounds: S1 normal and S2 normal.  Pulmonary:     Effort: Pulmonary effort is normal.     Breath sounds: Normal breath sounds.  Abdominal:     General: Abdomen is protuberant. Bowel sounds are normal.     Palpations: Abdomen is soft.     Tenderness: There is abdominal tenderness in the epigastric area. There is no right CVA tenderness, left CVA tenderness, guarding or rebound.   Musculoskeletal:     Cervical back: Neck supple.     Right lower leg: No edema.     Left lower leg: No edema.  Skin:    General: Skin is warm and dry.  Neurological:     General: No focal deficit present.     Mental Status: She is alert and oriented to person, place, and time.  Psychiatric:        Mood and Affect: Mood normal.        Behavior: Behavior normal. Behavior is cooperative.    Data Reviewed:  Results are pending, will review when available.  04/05/2020 transthoracic echocardiogram IMPRESSIONS:   1. Left ventricular ejection fraction, by estimation, is 60 to 65%. The left ventricle has normal function. The left ventricle has no regional wall motion abnormalities. Left ventricular diastolic parameters were normal.  2. Right ventricular systolic function is normal. The right ventricular size is normal. Tricuspid regurgitation signal is inadequate for assessing PA pressure.  3. The mitral valve is normal in structure. No evidence of mitral valve regurgitation. No evidence of mitral stenosis.  4. The aortic valve is normal in structure. Aortic valve regurgitation is not visualized. No aortic stenosis is present.  5. The inferior vena cava is normal in size with greater than 50% respiratory variability, suggesting right atrial pressure of 3 mmHg.  EKG: Vent. rate 124 BPM PR interval 138 ms QRS duration 95 ms QT/QTcB 335/482 ms P-R-T axes 57 23 1 Sinus tachycardia LAE, consider biatrial enlargement Borderline prolonged QT interval  Assessment and Plan: Principal Problem:   Intractable nausea and vomiting Complicated by:   Hematemesis Admit to telemetry/inpatient. Keep NPO for now. Continue IV fluids. Continue pantoprazole infusion. Monitor H&H. Transfuse as needed. GI consult requested. -Will follow the recommendations.  Active Problems:   Hypertension  Have responded to metoprolol and hydralazine. Continue hydralazine 20 mg IVP q4 hr as  needed. Continue metoprolol 5 mg IVP every 8 hours.    Elevated troponin  Likely demand ischemia in the setting of:   Tachycardia Discussed with cardiology by EDP. Will trend troponin level. Check  echocardiogram. Continue metoprolol as above.    Prolonged QT interval Avoid QT prolonging meds as possible. Magnesium sulfate 2 g IVPB x 1.. Keep electrolytes optimized. Check EKG in the morning.    Hypocalcemia Recheck calcium level in the morning. Further workup depending on results.    Chronic anemia Monitor hematocrit and hemoglobin.   Transfuse as needed.    Asthma No significant symptoms at the moment. Bronchodilators as needed.    Endometriosis Outpatient close follow-up with GYN.    SLE (systemic lupus erythematosus) (HCC) Follow-up with rheumatology as an outpatient.    Obesity, Class III, BMI 40-49.9 (morbid obesity) (HCC) Current BMI 43.90 kg/m. Lifestyle modifications. Follow-up with closely PCP and/or bariatric clinic as OP.    Advance Care Planning:   Code Status: Full Code   Consults: [facility].  Family Communication:   Severity of Illness: The appropriate patient status for this patient is OBSERVATION. Observation status is judged to be reasonable and necessary in order to provide the required intensity of service to ensure the patient's safety. The patient's presenting symptoms, physical exam findings, and initial radiographic and laboratory data in the context of their medical condition is felt to place them at decreased risk for further clinical deterioration. Furthermore, it is anticipated that the patient will be medically stable for discharge from the hospital within 2 midnights of admission.   Author: Bobette Mo, MD 09/27/2022 11:59 AM  For on call review [email or web address].   This document was prepared using Dragon voice recognition software and may contain some unintended transcription errors.

## 2022-09-27 NOTE — Plan of Care (Signed)
  Problem: Education: Goal: Knowledge of General Education information will improve Description: Including pain rating scale, medication(s)/side effects and non-pharmacologic comfort measures Outcome: Progressing   Problem: Health Behavior/Discharge Planning: Goal: Ability to manage health-related needs will improve Outcome: Progressing   Problem: Clinical Measurements: Goal: Ability to maintain clinical measurements within normal limits will improve Outcome: Progressing Goal: Diagnostic test results will improve Outcome: Progressing   Problem: Nutrition: Goal: Adequate nutrition will be maintained Outcome: Progressing   Problem: Coping: Goal: Level of anxiety will decrease Outcome: Not Progressing Note: Pt gets anxious very quickly when discussing her medical care or medical history.    Problem: Pain Managment: Goal: General experience of comfort will improve Outcome: Not Progressing

## 2022-09-28 ENCOUNTER — Observation Stay (HOSPITAL_COMMUNITY): Payer: Self-pay

## 2022-09-28 DIAGNOSIS — R1084 Generalized abdominal pain: Secondary | ICD-10-CM

## 2022-09-28 DIAGNOSIS — E611 Iron deficiency: Secondary | ICD-10-CM

## 2022-09-28 DIAGNOSIS — K920 Hematemesis: Secondary | ICD-10-CM

## 2022-09-28 DIAGNOSIS — D509 Iron deficiency anemia, unspecified: Secondary | ICD-10-CM

## 2022-09-28 LAB — CBC
HCT: 37.4 % (ref 36.0–46.0)
Hemoglobin: 11.9 g/dL — ABNORMAL LOW (ref 12.0–15.0)
MCH: 24.1 pg — ABNORMAL LOW (ref 26.0–34.0)
MCHC: 31.8 g/dL (ref 30.0–36.0)
MCV: 75.9 fL — ABNORMAL LOW (ref 80.0–100.0)
Platelets: 367 10*3/uL (ref 150–400)
RBC: 4.93 MIL/uL (ref 3.87–5.11)
RDW: 17.1 % — ABNORMAL HIGH (ref 11.5–15.5)
WBC: 11.2 10*3/uL — ABNORMAL HIGH (ref 4.0–10.5)
nRBC: 0 % (ref 0.0–0.2)

## 2022-09-28 LAB — COMPREHENSIVE METABOLIC PANEL
ALT: 15 U/L (ref 0–44)
AST: 16 U/L (ref 15–41)
Albumin: 3.8 g/dL (ref 3.5–5.0)
Alkaline Phosphatase: 73 U/L (ref 38–126)
Anion gap: 12 (ref 5–15)
BUN: 5 mg/dL — ABNORMAL LOW (ref 6–20)
CO2: 23 mmol/L (ref 22–32)
Calcium: 8.9 mg/dL (ref 8.9–10.3)
Chloride: 108 mmol/L (ref 98–111)
Creatinine, Ser: 0.82 mg/dL (ref 0.44–1.00)
GFR, Estimated: >60 mL/min (ref >60)
Glucose, Bld: 143 mg/dL — ABNORMAL HIGH (ref 70–99)
Potassium: 3.1 mmol/L — ABNORMAL LOW (ref 3.5–5.1)
Sodium: 143 mmol/L (ref 135–145)
Total Bilirubin: 0.3 mg/dL (ref 0.3–1.2)
Total Protein: 7 g/dL (ref 6.5–8.1)

## 2022-09-28 LAB — IRON AND TIBC
Iron: 25 ug/dL — ABNORMAL LOW (ref 28–170)
Saturation Ratios: 7 % — ABNORMAL LOW (ref 10.4–31.8)
TIBC: 384 ug/dL (ref 250–450)
UIBC: 359 ug/dL

## 2022-09-28 LAB — FERRITIN: Ferritin: 19 ng/mL (ref 11–307)

## 2022-09-28 LAB — FOLATE: Folate: 3.1 ng/mL — ABNORMAL LOW (ref >5.9)

## 2022-09-28 MED ORDER — NORETHINDRONE ACETATE 5 MG PO TABS
10.0000 mg | ORAL_TABLET | Freq: Every day | ORAL | Status: DC
  Administered 2022-09-28: 10 mg via ORAL
  Filled 2022-09-28: qty 2

## 2022-09-28 MED ORDER — HYDROMORPHONE HCL 1 MG/ML IJ SOLN
1.0000 mg | INTRAMUSCULAR | Status: DC | PRN
  Administered 2022-09-28: 1 mg via INTRAVENOUS
  Filled 2022-09-28: qty 1

## 2022-09-28 MED ORDER — POTASSIUM CHLORIDE 10 MEQ/100ML IV SOLN
10.0000 meq | INTRAVENOUS | Status: AC
  Administered 2022-09-28 (×3): 10 meq via INTRAVENOUS
  Filled 2022-09-28 (×3): qty 100

## 2022-09-28 MED ORDER — HYDROMORPHONE HCL 1 MG/ML IJ SOLN
0.5000 mg | Freq: Three times a day (TID) | INTRAMUSCULAR | Status: DC | PRN
  Administered 2022-09-28 – 2022-09-29 (×2): 0.5 mg via INTRAVENOUS
  Filled 2022-09-28 (×2): qty 0.5

## 2022-09-28 MED ORDER — DULOXETINE HCL 30 MG PO CPEP
30.0000 mg | ORAL_CAPSULE | Freq: Every day | ORAL | Status: DC
  Filled 2022-09-28: qty 1

## 2022-09-28 MED ORDER — HYDROXYCHLOROQUINE SULFATE 200 MG PO TABS
400.0000 mg | ORAL_TABLET | Freq: Every day | ORAL | Status: DC
  Administered 2022-09-28: 400 mg via ORAL
  Filled 2022-09-28: qty 2

## 2022-09-28 MED ORDER — POTASSIUM CHLORIDE 10 MEQ/100ML IV SOLN
10.0000 meq | Freq: Once | INTRAVENOUS | Status: AC
  Administered 2022-09-28: 10 meq via INTRAVENOUS
  Filled 2022-09-28: qty 100

## 2022-09-28 NOTE — Progress Notes (Addendum)
PROGRESS NOTE    [name]  ZOX:[id number] DOB: Aug 27, 1995 DOA: 09/27/2022 PCP: Pcp, No   Brief Narrative:  [name] is a 27 y.o. female with medical history significant of asthma, celiac disease, cluster B personality disorder, panic disorder, collagen vascular disease, diarrhea, endometriosis, lupus, ovarian cyst, pulmonary embolus, seizures, class III obesity who was recently admitted and discharged due to abdominal pain with intractable nausea and vomiting with hematemesis who is returning to the emergency department with similar symptoms.  She had a normal EGD 3 days ago. She reports her symptoms continue after discharge and got progressively worse with questionable hematemesis. Her ROS is otherwise unremarkable.  Hospitalist called for admission, GI called in consult.  Assessment & Plan:   Principal Problem:   Intractable nausea and vomiting Active Problems:   Chronic anemia   Asthma   Endometriosis   SLE (systemic lupus erythematosus) (HCC)   Obesity, Class III, BMI 40-49.9 (morbid obesity) (HCC)   Hematemesis   Prolonged QT interval   Hypocalcemia   Tachycardia   Hypertension   Elevated troponin   Generalized abdominal pain   Chronic bilateral lower abdominal pain   Intractable nausea and vomiting Intractable pain Reported hematemesis -EGD 3 days ago WNL -Appreciate GI insight/recs -FOBT pending but Hgb at baseline (see below) -Wean narcotics aggressively - dilaudid q4h today - transition to lower dose q6-8h in the next 24h -Tolerating clears well at bedside during my interview despite noted episodes of vomiting (unwitnessed by staff).   Iron deficiency anemia, chronic - at baseline Hgb stable - at baseline Recommend starting iron supplements once GI symptoms improve  Hypertension, essential PRN metoprolol/hydralazine - no antihypertensives on home med rec  Elevated troponin  Likely demand ischemia in the setting of: Tachycardia Discussed  with cardiology by EDP. Will trend troponin level. Check echocardiogram. Continue metoprolol as above.   Prolonged QT interval Avoid QT prolonging meds as possible. Magnesium sulfate 2 g IVPB x 1.. Keep electrolytes optimized. Check EKG in the morning.   Hypocalcemia Recheck calcium level in the morning. Further workup depending on results.  Asthma No significant symptoms at the moment. Bronchodilators as needed.  Endometriosis Outpatient close follow-up with GYN.   Systemic lupus erythematosus Follow-up with rheumatology as an outpatient.   Obesity, Class III, BMI 40-49.9 (morbid obesity) (HCC) Current BMI 43.90 kg/m. Lifestyle modifications. Follow-up with closely PCP and/or bariatric clinic as OP  DVT prophylaxis: SCDs Start: 09/27/22 1348 Code Status:   Code Status: Full Code Family Communication: Non present  Status is: Inpt  Dispo: The patient is from: Home              Anticipated d/c is to: Home              Anticipated d/c date is: 24-48h              Patient currently NOT medically stable for discharge  Consultants:  GI  Procedures:  None  Antimicrobials:  None   Subjective: No acute issues/events noted - patient continues to note ongoing nausea and vomiting this morning that is 'improving some'  Objective: Vitals:   09/27/22 1521 09/27/22 2000 09/27/22 2314 09/28/22 0457  BP: (!) 137/93 (!) 152/103 (!) 150/113 130/89  Pulse: (!) 107 90 79 71  Resp: 16 18 17 17   Temp: 98.3 F (36.8 C) 98.2 F (36.8 C) 98.3 F (36.8 C) 97.6 F (36.4 C)  TempSrc: Oral Oral    SpO2: 100% 100% 97% 97%  Weight:  Height:        Intake/Output Summary (Last 24 hours) at 09/28/2022 0816 Last data filed at 09/28/2022 0600 Gross per 24 hour  Intake 2726.74 ml  Output --  Net 2726.74 ml   Filed Weights   09/27/22 0609  Weight: 108.9 kg    Examination:  General exam: No acute distress Respiratory system: Clear to auscultation. Respiratory effort  normal. Cardiovascular system: S1 & S2 heard, RRR. No JVD, murmurs, rubs, gallops or clicks. No pedal edema. Gastrointestinal system: Abdomen is nondistended, soft and nontender. No organomegaly or masses felt. Normal bowel sounds heard. Central nervous system: Alert and oriented. No focal neurological deficits. Extremities: Symmetric 5 x 5 power. Skin: No rashes, lesions or ulcers  Data Reviewed: I have personally reviewed following labs and imaging studies  CBC: Recent Labs  Lab 09/24/22 1850 09/25/22 0559 09/26/22 1611 09/27/22 0639 09/27/22 1536 09/27/22 2046 09/28/22 0255  WBC 10.7* 10.0 10.2 12.7*  --   --  11.2*  NEUTROABS  --   --  7.5  --   --   --   --   HGB 10.8* 10.7* 11.3* 11.3* 11.5* 11.1* 11.9*  HCT 34.2* 33.4* 35.5* 34.9* 36.0 33.7* 37.4  MCV 74.0* 73.6* 75.1* 74.1*  --   --  75.9*  PLT 350 337 366 374  --   --  367   Basic Metabolic Panel: Recent Labs  Lab 09/24/22 0828 09/25/22 0559 09/26/22 1900 09/27/22 0639 09/28/22 0255  NA 137 138 140 137 143  K 3.5 3.3* 3.6 4.7 3.1*  CL 105 105 105 105 108  CO2 21* 20* 21* 20* 23  GLUCOSE 116* 82 125* 114* 143*  BUN <5* 6 8 7  5*  CREATININE 0.85 0.90 0.81 0.90 0.82  CALCIUM 9.2 8.7* 9.1 8.8* 8.9   GFR: Estimated Creatinine Clearance: 117.6 mL/min (by C-G formula based on SCr of 0.82 mg/dL). Liver Function Tests: Recent Labs  Lab 09/24/22 0828 09/25/22 0559 09/26/22 1900 09/27/22 1140 09/28/22 0255  AST 15 12* 18 20 16   ALT 14 11 15 15 15   ALKPHOS 77 68 72 73 73  BILITOT 0.8 0.4 0.3 0.4 0.3  PROT 6.9 6.0* 6.3* 7.3 7.0  ALBUMIN 3.7 3.3* 3.6 4.0 3.8   Recent Labs  Lab 09/23/22 1101 09/26/22 1900 09/27/22 0639  LIPASE 34 29 25   Anemia Panel: Recent Labs    09/28/22 0255  FOLATE 3.1*  FERRITIN 19  TIBC 384  IRON 25*   No results found for this or any previous visit (from the past 240 hour(s)).   Radiology Studies: DG Chest 2 View  Result Date: 09/27/2022 CLINICAL DATA:  27 year old  female with history of chest pain. Emesis. EXAM: CHEST - 2 VIEW COMPARISON:  Chest x-ray 12/28/2021. FINDINGS: Lung volumes are normal. No consolidative airspace disease. No pleural effusions. No pneumothorax. No pulmonary nodule or mass noted. Pulmonary vasculature and the cardiomediastinal silhouette are within normal limits. IMPRESSION: No radiographic evidence of acute cardiopulmonary disease. Electronically Signed   By: Trudie Reed M.D.   On: 09/27/2022 06:53   CT Angio Abd/Pel W and/or Wo Contrast  Result Date: 09/26/2022 CLINICAL DATA:  27 year old female with hematemesis, upper GI bleed suspected. EXAM: CTA ABDOMEN AND PELVIS WITHOUT AND WITH CONTRAST TECHNIQUE: Multidetector CT imaging of the abdomen and pelvis was performed using the standard protocol during bolus administration of intravenous contrast. Multiplanar reconstructed images and MIPs were obtained and reviewed to evaluate the vascular anatomy. RADIATION DOSE REDUCTION: This exam  was performed according to the departmental dose-optimization program which includes automated exposure control, adjustment of the mA and/or kV according to patient size and/or use of iterative reconstruction technique. CONTRAST:  75mL OMNIPAQUE IOHEXOL 350 MG/ML SOLN COMPARISON:  CTA chest, abdomen and pelvis 09/23/2022, CT abdomen and pelvis no contrast 02/12/2022. FINDINGS: VASCULAR Aorta: Normal. Celiac: Normal. SMA: Normal. Renals: Single artery supplies the right kidney. 2 arteries arising in close tandem supply the left kidney with the dominant artery more cephalad in takeoff. All 3 are normal. No branch occlusion is evident. IMA: Very small in caliber but no evidence of aneurysm, dissection, vasculitis or flow-limiting stenosis. Inflow: Normal. Proximal Outflow: Bilateral common femoral and visualized portions of the superficial and profunda femoral arteries are patent without evidence of aneurysm, dissection, vasculitis or significant stenosis. Veins:  Patent. Review of the MIP images confirms the above findings. NON-VASCULAR Lower chest: The cardiac size is normal. There is increased ground-glass opacity in the posterior basal right lower lobe consistent with asymmetric atelectasis, pneumonia or aspiration. The lung bases are otherwise clear. Chronic asymmetric elevation right hemidiaphragm. Hepatobiliary: Again noted is a 1.4 cm cyst superiorly in segment 8 of the liver, Hounsfield density is 16. No follow-up imaging is required. The liver is mildly steatotic measuring 18.3 cm in length without further focal abnormality. Again noted surgical absence of the gallbladder no biliary dilatation. Pancreas: No abnormality. Spleen: No abnormality.  No splenomegaly. Adrenals/Urinary Tract: Adrenal glands are unremarkable. Kidneys are normal, without renal calculi, focal lesion, or hydronephrosis. Bladder is increasingly thickened but not fully distended. Correlate with urinalysis for possible cystitis. No imaging findings of ascending UTI. Stomach/Bowel: No arterial or venous phase contrast extravasation into the GI tract is seen. No bowel obstruction or inflammation is evident. 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 There is mild-to-moderate fecal stasis. Scattered sigmoid diverticulosis without diverticulitis. Lymphatic: No adenopathy is seen. Reproductive: Uterus and bilateral adnexa are unremarkable. Scattered pelvic phleboliths. Other: Small umbilical fat hernia. No incarcerated hernia. No free hemorrhage, free air or focal inflammatory process. There is trace low-density fluid in the pelvic cul-de-sac to the right, typically physiologic at this age, and unchanged. No further free fluid. Musculoskeletal: No acute or significant osseous findings. Slight lumbar dextroscoliosis possibly positional. IMPRESSION: 1. No arterial or venous phase contrast extravasation into the GI tract is seen. 2. No aortic aneurysm, dissection or other CTA abnormality. 3. Increased  ground-glass opacity in the posterior basal right lower lobe consistent with asymmetric atelectasis, pneumonia or aspiration. 4. Increased bladder thickening versus underdistention. Correlate with urinalysis for cystitis. 5. Constipation and diverticulosis. 6. Mild hepatic steatosis. 7. Umbilical fat hernia. 8. Trace low-density fluid in the pelvic cul-de-sac to the right, typically physiologic at this age. Electronically Signed   By: Almira Bar M.D.   On: 09/26/2022 20:44    Scheduled Meds:  linaclotide  145 mcg Oral QAC breakfast   metoprolol tartrate  5 mg Intravenous Q8H   [START ON 10/01/2022] pantoprazole  40 mg Intravenous Q12H   Continuous Infusions:  sodium chloride 125 mL/hr at 09/28/22 0327   pantoprazole 8 mg/hr (09/28/22 0633)     LOS: 0 days   Time spent: 45 min  Azucena Fallen, DO [facility]  If 7PM-7AM, please contact night-coverage [email or web address]  09/28/2022, 8:16 AM

## 2022-09-28 NOTE — Plan of Care (Signed)
  Problem: Education: Goal: Knowledge of General Education information will improve Description: Including pain rating scale, medication(s)/side effects and non-pharmacologic comfort measures Outcome: Not Progressing   Problem: Health Behavior/Discharge Planning: Goal: Ability to manage health-related needs will improve Outcome: Not Progressing   Problem: Clinical Measurements: Goal: Ability to maintain clinical measurements within normal limits will improve Outcome: Not Progressing Goal: Will remain free from infection Outcome: Not Progressing Goal: Diagnostic test results will improve Outcome: Not Progressing Goal: Respiratory complications will improve Outcome: Not Progressing Goal: Cardiovascular complication will be avoided Outcome: Not Progressing   Problem: Activity: Goal: Risk for activity intolerance will decrease Outcome: Not Progressing   Problem: Nutrition: Goal: Adequate nutrition will be maintained Outcome: Not Progressing   Problem: Coping: Goal: Level of anxiety will decrease Outcome: Not Progressing   Problem: Elimination: Goal: Will not experience complications related to bowel motility Outcome: Not Progressing Goal: Will not experience complications related to urinary retention Outcome: Not Progressing   Problem: Pain Managment: Goal: General experience of comfort will improve Outcome: Not Progressing   Problem: Safety: Goal: Ability to remain free from injury will improve Outcome: Not Progressing   Problem: Pain Managment: Goal: General experience of comfort will improve Outcome: Not Progressing   Problem: Skin Integrity: Goal: Risk for impaired skin integrity will decrease Outcome: Not Progressing

## 2022-09-28 NOTE — Plan of Care (Signed)

## 2022-09-28 NOTE — Progress Notes (Signed)
   09/28/22 0920  TOC Brief Assessment  Insurance and Status Lapsed  Patient has primary care physician No  Home environment has been reviewed Alone  Prior level of function: independent  Prior/Current Home Services No current home services  Social Determinants of Health Reivew SDOH reviewed no interventions necessary  Readmission risk has been reviewed Yes  Transition of care needs transition of care needs identified, TOC will continue to follow (TOC consult pending in chart)

## 2022-09-28 NOTE — Progress Notes (Addendum)
Progress Note  Primary GI:  [facility] (only seen by [facility] as inpatient consultation)    DOA: 09/27/2022         Hospital Day: 2   Subjective  Chief Complaint: Hematemesis and AB pain   No family was present at the time of my evaluation. Patient lying in bed stating her pain is still poorly controlled she continues to have nausea and vomiting. States she is unable to take any pills or even liquids by mouth due to nausea and vomiting.  States she requires breakthrough IV pain medication and IV Benadryl to prevent itching. Patient continues to have some mild chest discomfort, had an elevated troponin that is now decreased, at first patient declined echo and wanted TEE but after discussing with patient she states there was some confusion and she prefers the transthoracic. Has not had a bowel movement since Monday. States she has muscle aches and restless legs which have been worse and feeling very fatigued.    Objective   Vital signs in last 24 hours: Temp:  [97.6 F (36.4 C)-98.3 F (36.8 C)] 97.6 F (36.4 C) (09/06 0457) Pulse Rate:  [71-107] 71 (09/06 0457) Resp:  [16-18] 17 (09/06 0457) BP: (129-152)/(68-113) 130/89 (09/06 0457) SpO2:  [97 %-100 %] 97 % (09/06 0457) Last BM Date : 09/20/22 Last BM recorded by nurses in past 5 days No data recorded  General:   Obese female no acute distress but states she is in extreme discomfort Heart: Regular rate and rhythm, no murmurs rubs or gallops. Pulm: Clear anteriorly; no wheezing Abdomen:  Soft, Obese AB, well-healed laparoscopic surgical scars, Sluggish/decreased bowel sounds. Mild to moderate tenderness in the lower abdomen and slightly diffuse, negative Carnett's. Without guarding and Without rebound Extremities:  Without edema. Msk:  Symmetrical without gross deformities. Peripheral pulses intact.  Neurologic:  Alert and  oriented x4;  No focal deficits.  Psychiatric:  Cooperative. Normal mood and  affect.  Intake/Output from previous day: 09/05 0701 - 09/06 0700 In: 2726.7 [I.V.:1726.7; IV Piggyback:1000] Out: -  Intake/Output this shift: No intake/output data recorded.  Studies/Results: DG Chest 2 View  Result Date: 09/27/2022 CLINICAL DATA:  28 year old female with history of chest pain. Emesis. EXAM: CHEST - 2 VIEW COMPARISON:  Chest x-ray 12/28/2021. FINDINGS: Lung volumes are normal. No consolidative airspace disease. No pleural effusions. No pneumothorax. No pulmonary nodule or mass noted. Pulmonary vasculature and the cardiomediastinal silhouette are within normal limits. IMPRESSION: No radiographic evidence of acute cardiopulmonary disease. Electronically Signed   By: Trudie Reed M.D.   On: 09/27/2022 06:53   CT Angio Abd/Pel W and/or Wo Contrast  Result Date: 09/26/2022 CLINICAL DATA:  27 year old female with hematemesis, upper GI bleed suspected. EXAM: CTA ABDOMEN AND PELVIS WITHOUT AND WITH CONTRAST TECHNIQUE: Multidetector CT imaging of the abdomen and pelvis was performed using the standard protocol during bolus administration of intravenous contrast. Multiplanar reconstructed images and MIPs were obtained and reviewed to evaluate the vascular anatomy. RADIATION DOSE REDUCTION: This exam was performed according to the departmental dose-optimization program which includes automated exposure control, adjustment of the mA and/or kV according to patient size and/or use of iterative reconstruction technique. CONTRAST:  75mL OMNIPAQUE IOHEXOL 350 MG/ML SOLN COMPARISON:  CTA chest, abdomen and pelvis 09/23/2022, CT abdomen and pelvis no contrast 02/12/2022. FINDINGS: VASCULAR Aorta: Normal. Celiac: Normal. SMA: Normal. Renals: Single artery supplies the right kidney. 2 arteries arising in close tandem supply the left kidney with the dominant artery more  cephalad in takeoff. All 3 are normal. No branch occlusion is evident. IMA: Very small in caliber but no evidence of aneurysm,  dissection, vasculitis or flow-limiting stenosis. Inflow: Normal. Proximal Outflow: Bilateral common femoral and visualized portions of the superficial and profunda femoral arteries are patent without evidence of aneurysm, dissection, vasculitis or significant stenosis. Veins: Patent. Review of the MIP images confirms the above findings. NON-VASCULAR Lower chest: The cardiac size is normal. There is increased ground-glass opacity in the posterior basal right lower lobe consistent with asymmetric atelectasis, pneumonia or aspiration. The lung bases are otherwise clear. Chronic asymmetric elevation right hemidiaphragm. Hepatobiliary: Again noted is a 1.4 cm cyst superiorly in segment 8 of the liver, Hounsfield density is 16. No follow-up imaging is required. The liver is mildly steatotic measuring 18.3 cm in length without further focal abnormality. Again noted surgical absence of the gallbladder no biliary dilatation. Pancreas: No abnormality. Spleen: No abnormality.  No splenomegaly. Adrenals/Urinary Tract: Adrenal glands are unremarkable. Kidneys are normal, without renal calculi, focal lesion, or hydronephrosis. Bladder is increasingly thickened but not fully distended. Correlate with urinalysis for possible cystitis. No imaging findings of ascending UTI. Stomach/Bowel: No arterial or venous phase contrast extravasation into the GI tract is seen. No bowel obstruction or inflammation is evident. 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 There is mild-to-moderate fecal stasis. Scattered sigmoid diverticulosis without diverticulitis. Lymphatic: No adenopathy is seen. Reproductive: Uterus and bilateral adnexa are unremarkable. Scattered pelvic phleboliths. Other: Small umbilical fat hernia. No incarcerated hernia. No free hemorrhage, free air or focal inflammatory process. There is trace low-density fluid in the pelvic cul-de-sac to the right, typically physiologic at this age, and unchanged. No further free  fluid. Musculoskeletal: No acute or significant osseous findings. Slight lumbar dextroscoliosis possibly positional. IMPRESSION: 1. No arterial or venous phase contrast extravasation into the GI tract is seen. 2. No aortic aneurysm, dissection or other CTA abnormality. 3. Increased ground-glass opacity in the posterior basal right lower lobe consistent with asymmetric atelectasis, pneumonia or aspiration. 4. Increased bladder thickening versus underdistention. Correlate with urinalysis for cystitis. 5. Constipation and diverticulosis. 6. Mild hepatic steatosis. 7. Umbilical fat hernia. 8. Trace low-density fluid in the pelvic cul-de-sac to the right, typically physiologic at this age. Electronically Signed   By: Almira Bar M.D.   On: 09/26/2022 20:44    Lab Results: Recent Labs    09/26/22 1611 09/27/22 0639 09/27/22 1536 09/27/22 2046 09/28/22 0255  WBC 10.2 12.7*  --   --  11.2*  HGB 11.3* 11.3* 11.5* 11.1* 11.9*  HCT 35.5* 34.9* 36.0 33.7* 37.4  PLT 366 374  --   --  367   BMET Recent Labs    09/26/22 1900 09/27/22 0639 09/28/22 0255  NA 140 137 143  K 3.6 4.7 3.1*  CL 105 105 108  CO2 21* 20* 23  GLUCOSE 125* 114* 143*  BUN 8 7 5*  CREATININE 0.81 0.90 0.82  CALCIUM 9.1 8.8* 8.9   LFT Recent Labs    09/27/22 1140 09/28/22 0255  PROT 7.3 7.0  ALBUMIN 4.0 3.8  AST 20 16  ALT 15 15  ALKPHOS 73 73  BILITOT 0.4 0.3  BILIDIR 0.1  --   IBILI 0.3  --    PT/INR No results for input(s): "LABPROT", "INR" in the last 72 hours.   Scheduled Meds:  linaclotide  145 mcg Oral QAC breakfast   metoprolol tartrate  5 mg Intravenous Q8H   [START ON 10/01/2022] pantoprazole  40  mg Intravenous Q12H   Continuous Infusions:  sodium chloride 125 mL/hr at 09/28/22 0327   pantoprazole 8 mg/hr (09/28/22 1610)      Patient profile:   27 year old female with extensive history of endometriosis status post exploratory laparotomies, systemic lupus with history of PE on and off  Eliquis currently not on it, celiac per patient currently on gluten-free diet, possible history of pericardial effusion/pericarditis per patient, with chronic lower abdominal pain and nausea vomiting. From extensive chart review patient's had repeated admissions with recurrent nausea, vomiting and lower abdominal discomfort.    Impression/Plan:   Hematemesis with nausea and vomiting EGD 09/03 for hematemesis was completely unremarkable no biopsies were taken.  Has had repeated EGDs in the past that were unremarkable Patient never picked up PPI Stable hemoglobin at 11.3, recheck 11.5, today 11.9 At this time with stable hemoglobin this most likely represents potential Mallory-Weiss tear, esophagitis from repeated nausea and vomiting  She continues to have nausea and vomiting, states phenergan better, has only had one dose of reglan -continue PPI Protonix 40 mg twice daily IV - Phenergan IV PRN, if not helping can consider compazine - will make reglan scheduled 5 mg TID - will do clear liquid diet, advance as tolerated - monitor H/H, transfuse to keep greater than 7 -Any continuing drop in hemoglobin or gross GI bleedng will consider repeat endoscopic evaluation but at this time EGD likely low yield  -Patient asking about potential gastric emptying study is never had previously, stated that we do not do these inpatient due to false positives, especially with opioids and decreased activity, can discuss with her outpatient GI doctor.   Lower abdominal pain-question from endometriosis, opioid induced constipation, IBS, appear to be more chronic CT abdomen pelvis unremarkable other than constipation  --Started linzess 145 today if patient can tolerate oral, consider increasing to 290, may want to try movantik versus outpatient motegrity --Would want to avoid oral NSAIDs with hematemesis, nausea and vomiting but could consider trial of IV Toradol if no contraindications - pain control per primary,  difficult patient with pain control - will try to be more aggressive with bowel regiment, uncertain if she would be able to tolerate miralax at this time, will do dulcolax suppository.    chronic microcytic anemia with history of IDA and folate deficiency Colonoscopy 2020, unable to see report patient's  repeated CT abdomen pelvis without inflammation On continuous BCP, not having menstrual periods ? Anemia of chronic disease with SLE - folate def, start on folic acid  - patient with IDA, iron 25, saturation 7 - suggest IV iron - get FOBT -may need repeat colon/?enteroscopy with symptoms and IDA, she would not be able to tolerate prep at this time and has significant constipation, will try to maximize bowels while here- consider inpatient versus outpatient endoscopy. - difficult case, more recommendations per Dr. Meridee Score   Elevated troponin 6--> 61 ---> 6 Has had some chest discomfort CT with possible aspiration pneumonia but no no fevers,Leukocytosis improving echocardiogram pending Per patient has potentially had pleural effusion or pericarditis in the past Would want to get echo before any endoscopic evaluation   Systemic lupus with hypercoagulable state history of PE 09/01 CTA negative Patient currently not on anticoagulation Not currently on treatment for her lupus. - VTE prophylaxis, suggest follow up outpatient for her SLE and treatment   History of depression anxiety Uncertain how this plays a role but would suggest potentially following up with someone outpatient.  Principal Problem:  Intractable nausea and vomiting Active Problems:   Chronic anemia   Asthma   Endometriosis   SLE (systemic lupus erythematosus) (HCC)   Obesity, Class III, BMI 40-49.9 (morbid obesity) (HCC)   Hematemesis   Prolonged QT interval   Hypocalcemia   Tachycardia   Hypertension   Elevated troponin   Generalized abdominal pain   Chronic bilateral lower abdominal pain    LOS: 0 days    Doree Albee  09/28/2022, 10:22 AM    Attending Physician's Attestation   I have taken an interval history, reviewed the chart and examined the patient.   Patient refusing echocardiogram still.  Trying to have the patient move her bowels but she is not able to take Linzess.  I cannot even consider an endoscopic evaluation such as an enteroscopy without cardiology feeling that she is stable, and thankfully her hemoglobin is stable.  As an outpatient, she needs to follow-up with her primary gastroenterology team through [facility], to consider an endoscopic reevaluation since she does not have true iron deficiency and microcytosis.  She will remain an unassigned patient at this time will not have scheduled follow-up with [facility].  I think Linzess would be helpful for the patient but she is refusing that as well.  Etiology of her acute on chronic abdominal pain remains unclear, but other than a few doses of IV Toradol, I would try to minimize using opioid therapy is much as possible if possible.  Methylnaltrexone could be considered however if she has more progressive constipation.  At this point inpatient GI is going to sign off, we can be reached out if something else develops or changes, or if she stabilizes and undergoes echocardiogram and where in which she is felt to not be high risk from an anesthesia standpoint.  I agree with the Advanced Practitioner's note, impression, and recommendations with updates and my documentation as noted above.  The majority of the medical decision making/process, formulation of the impression/plan of action for the patient were performed by me with substantive portion of this encounter (>50% time spent including complete performance of at least one of the key components of MDM, History, and/or Exam).   Corliss Parish, MD [facility] Office # [phone]

## 2022-09-28 NOTE — Progress Notes (Signed)
Patient refused Echo  [name] RVT RCS

## 2022-09-29 ENCOUNTER — Inpatient Hospital Stay (HOSPITAL_COMMUNITY): Payer: Self-pay

## 2022-09-29 DIAGNOSIS — I10 Essential (primary) hypertension: Secondary | ICD-10-CM

## 2022-09-29 DIAGNOSIS — R7989 Other specified abnormal findings of blood chemistry: Secondary | ICD-10-CM

## 2022-09-29 LAB — ECHOCARDIOGRAM COMPLETE
AR max vel: 2.49 cm2
AV Area VTI: 2.64 cm2
AV Area mean vel: 2.53 cm2
AV Mean grad: 4 mmHg
AV Peak grad: 8.3 mmHg
Ao pk vel: 1.44 m/s
Area-P 1/2: 4.49 cm2
Height: 62 in
S' Lateral: 2.3 cm
Weight: 3840 [oz_av]

## 2022-09-29 LAB — COMPREHENSIVE METABOLIC PANEL
ALT: 13 U/L (ref 0–44)
AST: 12 U/L — ABNORMAL LOW (ref 15–41)
Albumin: 3.5 g/dL (ref 3.5–5.0)
Alkaline Phosphatase: 70 U/L (ref 38–126)
Anion gap: 8 (ref 5–15)
BUN: 5 mg/dL — ABNORMAL LOW (ref 6–20)
CO2: 22 mmol/L (ref 22–32)
Calcium: 8.5 mg/dL — ABNORMAL LOW (ref 8.9–10.3)
Chloride: 106 mmol/L (ref 98–111)
Creatinine, Ser: 0.79 mg/dL (ref 0.44–1.00)
GFR, Estimated: >60 mL/min (ref >60)
Glucose, Bld: 94 mg/dL (ref 70–99)
Potassium: 3.3 mmol/L — ABNORMAL LOW (ref 3.5–5.1)
Sodium: 136 mmol/L (ref 135–145)
Total Bilirubin: 0.5 mg/dL (ref 0.3–1.2)
Total Protein: 6.5 g/dL (ref 6.5–8.1)

## 2022-09-29 LAB — CBC
HCT: 35.6 % — ABNORMAL LOW (ref 36.0–46.0)
Hemoglobin: 11.4 g/dL — ABNORMAL LOW (ref 12.0–15.0)
MCH: 24.5 pg — ABNORMAL LOW (ref 26.0–34.0)
MCHC: 32 g/dL (ref 30.0–36.0)
MCV: 76.4 fL — ABNORMAL LOW (ref 80.0–100.0)
Platelets: 358 10*3/uL (ref 150–400)
RBC: 4.66 MIL/uL (ref 3.87–5.11)
RDW: 17.1 % — ABNORMAL HIGH (ref 11.5–15.5)
WBC: 12.2 10*3/uL — ABNORMAL HIGH (ref 4.0–10.5)
nRBC: 0 % (ref 0.0–0.2)

## 2022-09-29 NOTE — Plan of Care (Signed)
  Problem: Pain Managment: Goal: General experience of comfort will improve Outcome: Progressing   Problem: Safety: Goal: Ability to remain free from injury will improve Outcome: Progressing   Problem: Skin Integrity: Goal: Risk for impaired skin integrity will decrease Outcome: Progressing   

## 2022-09-29 NOTE — Discharge Summary (Signed)
Physician Discharge Summary  [name]:[id number] DOB: [birth year] DOA: 09/27/2022  PCP: Pcp, No  Admit date: 09/27/2022 Discharge date: 09/29/2022  Admitted From: Home Disposition:  Home  Discharge Condition:Guarded  CODE STATUS:Full  Diet recommendation: Full liquid until follow up with PCP/GI    Brief/Interim Summary: [name] is a 27 y.o. female with medical history significant of asthma, celiac disease, cluster B personality disorder, panic disorder, collagen vascular disease, diarrhea, endometriosis, lupus, ovarian cyst, pulmonary embolus, seizures, class III obesity who was recently admitted and discharged due to abdominal pain with intractable nausea and vomiting with hematemesis who is returning to the emergency department with similar symptoms.   She had a normal EGD 3 days prior to admission. She reports her symptoms continue after discharge and got progressively worse with questionable hematemesis. Her ROS is otherwise unremarkable.   Hospitalist called for admission, GI called in consult.  Patient left AMA prior to further workup/evaluation with GI despite multiple conversations with her about her symptoms. Recommend close follow up with PCP/GI as scheduled. No new medications at discharge.  Discharge Diagnoses:  Principal Problem:   Intractable nausea and vomiting Active Problems:   Chronic anemia   Asthma   Endometriosis   SLE (systemic lupus erythematosus) (HCC)   Obesity, Class III, BMI 40-49.9 (morbid obesity) (HCC)   Hematemesis   Prolonged QT interval   Hypocalcemia   Tachycardia   Hypertension   Elevated troponin   Generalized abdominal pain   Chronic bilateral lower abdominal pain   Microcytic anemia   Iron deficiency    Discharge Instructions     Allergies  Allergen Reactions   Amoxicillin Anaphylaxis, Swelling and Other (See Comments)    Hand swelling (PENFAST 4+) Tolerated ceftriaxone 2021 PenFast Score 4+:  High risk of  positive penicillin allergy (50%) Oral challenge NOT recommended  Cephalosporins may be considered under close monitoring   Azithromycin Anaphylaxis and Other (See Comments)   Fish-Derived Products Anaphylaxis and Other (See Comments)    Eats shellfish alright   Peanut-Containing Drug Products Hives, Itching, Rash and Other (See Comments)         Gluten Meal Itching, Rash and Other (See Comments)    Celiac disease   Iodinated Contrast Media Hives and Itching    02/04/2020 Patient stated has history of itching and hives with CT IV Iodine contrast, premedicated with Benadryl 25 mg IV prior to CT IV Iodine scan today.Post CT scan patient reported itching, Benadryl 25 mg IV given, symptoms resolved, NP recommends for patient to get premedicated with Benadryl 50 mg IV prior to future CT IV Iodine scans.    Other Hives, Itching, Rash and Other (See Comments)    Moderna covid vaccine -Syncope - ICU 2 weeks Tree nuts- Hives, itching, rashes   Iodine Itching   Lactose Intolerance (Gi) Nausea And Vomiting and Other (See Comments)    Can tolerate, in small doses    Consultations: GI   Procedures/Studies: ECHOCARDIOGRAM COMPLETE  Result Date: 09/29/2022    ECHOCARDIOGRAM REPORT   Patient Name:   [name] Date of Exam: 09/29/2022 Medical Rec #:  [id number]         Height:       62.0 in Accession #:    [id number]        Weight:       240.0 lb Date of Birth:  [birth year]         BSA:          2.066  m Patient Age:    27 years          BP:           132/79 mmHg Patient Gender: F                 HR:           90 bpm. Exam Location:  Inpatient Procedure: 2D Echo, Color Doppler and Cardiac Doppler Indications:    Elevated Troponin  History:        Patient has prior history of Echocardiogram examinations, most                 recent 04/05/2020. SLE, h/o PE; Risk Factors:Hypertension.  Sonographer:    Milbert Coulter Referring Phys: [id number] DAVID MANUEL ORTIZ IMPRESSIONS  1. Left ventricular ejection fraction,  by estimation, is 60 to 65%. The left ventricle has normal function. The left ventricle has no regional wall motion abnormalities. Left ventricular diastolic parameters were normal.  2. Right ventricular systolic function is normal. The right ventricular size is normal.  3. The mitral valve is normal in structure. Trivial mitral valve regurgitation. No evidence of mitral stenosis.  4. The aortic valve is tricuspid. Aortic valve regurgitation is not visualized. No aortic stenosis is present.  5. The inferior vena cava is normal in size with greater than 50% respiratory variability, suggesting right atrial pressure of 3 mmHg. FINDINGS  Left Ventricle: Left ventricular ejection fraction, by estimation, is 60 to 65%. The left ventricle has normal function. The left ventricle has no regional wall motion abnormalities. The left ventricular internal cavity size was normal in size. There is  no left ventricular hypertrophy. Left ventricular diastolic parameters were normal. Right Ventricle: The right ventricular size is normal. No increase in right ventricular wall thickness. Right ventricular systolic function is normal. Left Atrium: Left atrial size was normal in size. Right Atrium: Right atrial size was normal in size. Pericardium: There is no evidence of pericardial effusion. Mitral Valve: The mitral valve is normal in structure. Trivial mitral valve regurgitation. No evidence of mitral valve stenosis. Tricuspid Valve: The tricuspid valve is normal in structure. Tricuspid valve regurgitation is not demonstrated. No evidence of tricuspid stenosis. Aortic Valve: The aortic valve is tricuspid. Aortic valve regurgitation is not visualized. No aortic stenosis is present. Aortic valve mean gradient measures 4.0 mmHg. Aortic valve peak gradient measures 8.3 mmHg. Aortic valve area, by VTI measures 2.64 cm. Pulmonic Valve: The pulmonic valve was normal in structure. Pulmonic valve regurgitation is trivial. No evidence of  pulmonic stenosis. Aorta: The aortic root is normal in size and structure. Venous: The inferior vena cava is normal in size with greater than 50% respiratory variability, suggesting right atrial pressure of 3 mmHg. IAS/Shunts: The interatrial septum was not well visualized.  LEFT VENTRICLE PLAX 2D LVIDd:         4.10 cm   Diastology LVIDs:         2.30 cm   LV e' medial:    6.96 cm/s LV PW:         1.00 cm   LV E/e' medial:  11.0 LV IVS:        1.00 cm   LV e' lateral:   10.70 cm/s LVOT diam:     2.00 cm   LV E/e' lateral: 7.1 LV SV:         62 LV SV Index:   30 LVOT Area:     3.14 cm  RIGHT VENTRICLE  RV Basal diam:  3.20 cm RV Mid diam:    2.70 cm RV S prime:     12.30 cm/s TAPSE (M-mode): 2.0 cm LEFT ATRIUM             Index        RIGHT ATRIUM           Index LA diam:        3.70 cm 1.79 cm/m   RA Area:     12.40 cm LA Vol (A2C):   37.1 ml 17.96 ml/m  RA Volume:   25.60 ml  12.39 ml/m LA Vol (A4C):   23.1 ml 11.18 ml/m LA Biplane Vol: 30.5 ml 14.76 ml/m  AORTIC VALVE AV Area (Vmax):    2.49 cm AV Area (Vmean):   2.53 cm AV Area (VTI):     2.64 cm AV Vmax:           144.00 cm/s AV Vmean:          96.400 cm/s AV VTI:            0.236 m AV Peak Grad:      8.3 mmHg AV Mean Grad:      4.0 mmHg LVOT Vmax:         114.00 cm/s LVOT Vmean:        77.600 cm/s LVOT VTI:          0.198 m LVOT/AV VTI ratio: 0.84  AORTA Ao Root diam: 2.80 cm Ao Asc diam:  2.70 cm MITRAL VALVE MV Area (PHT): 4.49 cm    SHUNTS MV Decel Time: 169 msec    Systemic VTI:  0.20 m MV E velocity: 76.30 cm/s  Systemic Diam: 2.00 cm MV A velocity: 67.30 cm/s MV E/A ratio:  1.13 Charlton Haws MD Electronically signed by Charlton Haws MD Signature Date/Time: 09/29/2022/10:43:18 AM    Final    DG Chest 2 View  Result Date: 09/27/2022 CLINICAL DATA:  27 year old female with history of chest pain. Emesis. EXAM: CHEST - 2 VIEW COMPARISON:  Chest x-ray 12/28/2021. FINDINGS: Lung volumes are normal. No consolidative airspace disease. No pleural  effusions. No pneumothorax. No pulmonary nodule or mass noted. Pulmonary vasculature and the cardiomediastinal silhouette are within normal limits. IMPRESSION: No radiographic evidence of acute cardiopulmonary disease. Electronically Signed   By: Trudie Reed M.D.   On: 09/27/2022 06:53   CT Angio Abd/Pel W and/or Wo Contrast  Result Date: 09/26/2022 CLINICAL DATA:  27 year old female with hematemesis, upper GI bleed suspected. EXAM: CTA ABDOMEN AND PELVIS WITHOUT AND WITH CONTRAST TECHNIQUE: Multidetector CT imaging of the abdomen and pelvis was performed using the standard protocol during bolus administration of intravenous contrast. Multiplanar reconstructed images and MIPs were obtained and reviewed to evaluate the vascular anatomy. RADIATION DOSE REDUCTION: This exam was performed according to the departmental dose-optimization program which includes automated exposure control, adjustment of the mA and/or kV according to patient size and/or use of iterative reconstruction technique. CONTRAST:  75mL OMNIPAQUE IOHEXOL 350 MG/ML SOLN COMPARISON:  CTA chest, abdomen and pelvis 09/23/2022, CT abdomen and pelvis no contrast 02/12/2022. FINDINGS: VASCULAR Aorta: Normal. Celiac: Normal. SMA: Normal. Renals: Single artery supplies the right kidney. 2 arteries arising in close tandem supply the left kidney with the dominant artery more cephalad in takeoff. All 3 are normal. No branch occlusion is evident. IMA: Very small in caliber but no evidence of aneurysm, dissection, vasculitis or flow-limiting stenosis. Inflow: Normal. Proximal Outflow: Bilateral common femoral and visualized portions  of the superficial and profunda femoral arteries are patent without evidence of aneurysm, dissection, vasculitis or significant stenosis. Veins: Patent. Review of the MIP images confirms the above findings. NON-VASCULAR Lower chest: The cardiac size is normal. There is increased ground-glass opacity in the posterior basal right  lower lobe consistent with asymmetric atelectasis, pneumonia or aspiration. The lung bases are otherwise clear. Chronic asymmetric elevation right hemidiaphragm. Hepatobiliary: Again noted is a 1.4 cm cyst superiorly in segment 8 of the liver, Hounsfield density is 16. No follow-up imaging is required. The liver is mildly steatotic measuring 18.3 cm in length without further focal abnormality. Again noted surgical absence of the gallbladder no biliary dilatation. Pancreas: No abnormality. Spleen: No abnormality.  No splenomegaly. Adrenals/Urinary Tract: Adrenal glands are unremarkable. Kidneys are normal, without renal calculi, focal lesion, or hydronephrosis. Bladder is increasingly thickened but not fully distended. Correlate with urinalysis for possible cystitis. No imaging findings of ascending UTI. Stomach/Bowel: No arterial or venous phase contrast extravasation into the GI tract is seen. No bowel obstruction or inflammation is evident. 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 There is mild-to-moderate fecal stasis. Scattered sigmoid diverticulosis without diverticulitis. Lymphatic: No adenopathy is seen. Reproductive: Uterus and bilateral adnexa are unremarkable. Scattered pelvic phleboliths. Other: Small umbilical fat hernia. No incarcerated hernia. No free hemorrhage, free air or focal inflammatory process. There is trace low-density fluid in the pelvic cul-de-sac to the right, typically physiologic at this age, and unchanged. No further free fluid. Musculoskeletal: No acute or significant osseous findings. Slight lumbar dextroscoliosis possibly positional. IMPRESSION: 1. No arterial or venous phase contrast extravasation into the GI tract is seen. 2. No aortic aneurysm, dissection or other CTA abnormality. 3. Increased ground-glass opacity in the posterior basal right lower lobe consistent with asymmetric atelectasis, pneumonia or aspiration. 4. Increased bladder thickening versus underdistention.  Correlate with urinalysis for cystitis. 5. Constipation and diverticulosis. 6. Mild hepatic steatosis. 7. Umbilical fat hernia. 8. Trace low-density fluid in the pelvic cul-de-sac to the right, typically physiologic at this age. Electronically Signed   By: Almira Bar M.D.   On: 09/26/2022 20:44   CT Angio Chest/Abd/Pel for Dissection W and/or Wo Contrast  Result Date: 09/23/2022 CLINICAL DATA:  SHOB, CP, hx of PE, upper GI bleed EXAM: CT ANGIOGRAPHY CHEST, ABDOMEN AND PELVIS TECHNIQUE: Non-contrast CT of the chest was initially obtained. Multidetector CT imaging through the chest, abdomen and pelvis was performed using the standard protocol during bolus administration of intravenous contrast. Multiplanar reconstructed images and MIPs were obtained and reviewed to evaluate the vascular anatomy. RADIATION DOSE REDUCTION: This exam was performed according to the departmental dose-optimization program which includes automated exposure control, adjustment of the mA and/or kV according to patient size and/or use of iterative reconstruction technique. CONTRAST:  OMNIPAQUE IOHEXOL 350 MG/ML SOLN COMPARISON:  CT abdomen pelvis 02/12/2022 FINDINGS: CTA CHEST FINDINGS Cardiovascular: Preferential opacification of the thoracic aorta. No evidence of thoracic aortic aneurysm or dissection. Normal heart size. No significant pericardial effusion. No atherosclerotic plaque of the thoracic aorta. No coronary artery calcifications. The main pulmonary artery is normal in caliber. Mediastinum/Nodes: No enlarged mediastinal, hilar, or axillary lymph nodes. Thyroid gland, trachea, and esophagus demonstrate no significant findings. Lungs/Pleura: Bilateral lower lobe atelectasis. No focal consolidation. No pulmonary nodule. No pulmonary mass. No pleural effusion. No pneumothorax. Musculoskeletal: No chest wall abnormality. No suspicious lytic or blastic osseous lesions. No acute displaced fracture. Review of the MIP images  confirms the above findings. CTA ABDOMEN AND PELVIS FINDINGS  VASCULAR No evidence extravasation of intravenous contrast within the gastric lumen on arterial view. Aorta: Normal caliber aorta without aneurysm, dissection, vasculitis or significant stenosis. Celiac: Patent without evidence of aneurysm, dissection, vasculitis or significant stenosis. SMA: Patent without evidence of aneurysm, dissection, vasculitis or significant stenosis. Renals: Both renal arteries are patent without evidence of aneurysm, dissection, vasculitis, fibromuscular dysplasia or significant stenosis. IMA: Patent without evidence of aneurysm, dissection, vasculitis or significant stenosis. Inflow: Patent without evidence of aneurysm, dissection, vasculitis or significant stenosis. Veins: No obvious venous abnormality within the limitations of this arterial phase study. Review of the MIP images confirms the above findings. NON-VASCULAR Hepatobiliary: Pericentimeter fluid density likely represents simple renal cyst. Status post cholecystectomy. No biliary dilatation. Pancreas: No focal lesion. Normal pancreatic contour. No surrounding inflammatory changes. No main pancreatic ductal dilatation. Spleen: Normal in size without focal abnormality. Adrenals/Urinary Tract: No adrenal nodule bilaterally. Bilateral kidneys enhance symmetrically. No hydronephrosis. No hydroureter. The urinary bladder is unremarkable. Stomach/Bowel: Stomach is within normal limits. No evidence of bowel wall thickening or dilatation. Status post appendectomy. Lymphatic: No lymphadenopathy. Reproductive: Uterus and bilateral adnexa are unremarkable. Other: No intraperitoneal free fluid. No intraperitoneal free gas. No organized fluid collection. Musculoskeletal: No abdominal wall hernia or abnormality. No suspicious lytic or blastic osseous lesions. No acute displaced fracture. Review of the MIP images confirms the above findings. IMPRESSION: 1. No acute thoracic or  abdominal aorta abnormality. 2. No acute intra-abdominal or intrapelvic abnormality. 3. Status post cholecystectomy and appendectomy. Electronically Signed   By: Tish Frederickson M.D.   On: 09/23/2022 19:41   US PELVIS (TRANSABDOMINAL ONLY)  Result Date: 08/30/2022 CLINICAL DATA:  Pelvic pain for 1 day.  Unknown LMP. EXAM: TRANSABDOMINAL ULTRASOUND OF PELVIS TECHNIQUE: Transabdominal ultrasound examination of the pelvis was performed including evaluation of the uterus, ovaries, adnexal regions, and pelvic cul-de-sac. COMPARISON:  02/12/2022 CT, 01/20/2022 ultrasound FINDINGS: Uterus Measurements: 7.2 x 2.1 x 4.1 centimeters = volume: 33.3 mL. No fibroids or other mass visualized. Endometrium Thickness: Not visualized. Right ovary Measurements: Not visualized.  No adnexal mass identified. Left ovary Measurements: Not visualized.  No adnexal mass identified. Other findings: Study quality is degraded by patient body habitus and overlying bowel gas. Patient declined transvaginal portion of the exam. IMPRESSION: 1. Study is limited by patient body habitus and overlying bowel gas. 2. No uterine mass is identified. The endometrium was not visualized. The ovaries are not visualized. Electronically Signed   By: Norva Pavlov M.D.   On: 08/30/2022 16:23     Subjective: No acute issues/events overnight - patient left AMA this morning prior to GI evaluation. Denotes 'ongoing pain, nausea, and vomiting' despite being NPO and on supportive medications. No emesis or blood was noted by staff during this hospitalization - patient has a picture on her phone (consistent with last hospitalization) with blood streaked emesis, but this was not demonstrated during hospitalization.   Discharge Exam: Vitals:   09/28/22 1943 09/29/22 0523  BP: 123/73 132/73  Pulse: 94 100  Resp: 20 18  Temp: 98.2 F (36.8 C) 98.6 F (37 C)  SpO2: 94% 91%   Vitals:   09/28/22 0457 09/28/22 1230 09/28/22 1943 09/29/22 0523  BP: 130/89  132/79 123/73 132/73  Pulse: 71 86 94 100  Resp: 17  20 18   Temp: 97.6 F (36.4 C) 98.5 F (36.9 C) 98.2 F (36.8 C) 98.6 F (37 C)  TempSrc:  Oral Oral   SpO2: 97% 93% 94% 91%  Weight:  Height:        General: Pt is alert, awake, not in acute distress Cardiovascular: RRR, S1/S2 +, no rubs, no gallops Respiratory: CTA bilaterally, no wheezing, no rhonchi Abdominal: Soft, NT, ND, bowel sounds + Extremities: no edema, no cyanosis    The results of significant diagnostics from this hospitalization (including imaging, microbiology, ancillary and laboratory) are listed below for reference.     Microbiology: No results found for this or any previous visit (from the past 240 hour(s)).   Labs: Basic Metabolic Panel: Recent Labs  Lab 09/25/22 0559 09/26/22 1900 09/27/22 0639 09/28/22 0255 09/29/22 0517  NA 138 140 137 143 136  K 3.3* 3.6 4.7 3.1* 3.3*  CL 105 105 105 108 106  CO2 20* 21* 20* 23 22  GLUCOSE 82 125* 114* 143* 94  BUN 6 8 7  5* 5*  CREATININE 0.90 0.81 0.90 0.82 0.79  CALCIUM 8.7* 9.1 8.8* 8.9 8.5*   Liver Function Tests: Recent Labs  Lab 09/25/22 0559 09/26/22 1900 09/27/22 1140 09/28/22 0255 09/29/22 0517  AST 12* 18 20 16  12*  ALT 11 15 15 15 13   ALKPHOS 68 72 73 73 70  BILITOT 0.4 0.3 0.4 0.3 0.5  PROT 6.0* 6.3* 7.3 7.0 6.5  ALBUMIN 3.3* 3.6 4.0 3.8 3.5   Recent Labs  Lab 09/23/22 1101 09/26/22 1900 09/27/22 0639  LIPASE 34 29 25   No results for input(s): "AMMONIA" in the last 168 hours. CBC: Recent Labs  Lab 09/25/22 0559 09/26/22 1611 09/27/22 0639 09/27/22 1536 09/27/22 2046 09/28/22 0255 09/29/22 0517  WBC 10.0 10.2 12.7*  --   --  11.2* 12.2*  NEUTROABS  --  7.5  --   --   --   --   --   HGB 10.7* 11.3* 11.3* 11.5* 11.1* 11.9* 11.4*  HCT 33.4* 35.5* 34.9* 36.0 33.7* 37.4 35.6*  MCV 73.6* 75.1* 74.1*  --   --  75.9* 76.4*  PLT 337 366 374  --   --  367 358   Anemia work up Recent Labs    09/28/22 0255  FOLATE  3.1*  FERRITIN 19  TIBC 384  IRON 25*   Urinalysis    Component Value Date/Time   COLORURINE STRAW (A) 09/27/2022 0639   APPEARANCEUR CLEAR 09/27/2022 0639   LABSPEC 1.005 09/27/2022 0639   PHURINE 6.0 09/27/2022 0639   GLUCOSEU NEGATIVE 09/27/2022 0639   HGBUR NEGATIVE 09/27/2022 0639   BILIRUBINUR NEGATIVE 09/27/2022 0639   KETONESUR NEGATIVE 09/27/2022 0639   PROTEINUR NEGATIVE 09/27/2022 0639   UROBILINOGEN 0.2 09/06/2021 0946   NITRITE NEGATIVE 09/27/2022 0639   LEUKOCYTESUR NEGATIVE 09/27/2022 0639   Sepsis Labs Recent Labs  Lab 09/26/22 1611 09/27/22 0639 09/28/22 0255 09/29/22 0517  WBC 10.2 12.7* 11.2* 12.2*   Microbiology No results found for this or any previous visit (from the past 240 hour(s)).   Time coordinating discharge: Over 30 minutes  SIGNED:   Azucena Fallen, DO [facility] 09/29/2022, 4:06 PM Pager   If 7PM-7AM, please contact night-coverage [email or web address]

## 2022-09-30 ENCOUNTER — Other Ambulatory Visit: Payer: Self-pay

## 2022-09-30 ENCOUNTER — Emergency Department (HOSPITAL_BASED_OUTPATIENT_CLINIC_OR_DEPARTMENT_OTHER)
Admission: EM | Admit: 2022-09-30 | Discharge: 2022-09-30 | Disposition: A | Discharge status: Home / Self Care | Payer: Self-pay | Attending: Emergency Medicine | Admitting: Emergency Medicine

## 2022-09-30 DIAGNOSIS — I10 Essential (primary) hypertension: Secondary | ICD-10-CM | POA: Insufficient documentation

## 2022-09-30 DIAGNOSIS — J45909 Unspecified asthma, uncomplicated: Secondary | ICD-10-CM | POA: Insufficient documentation

## 2022-09-30 DIAGNOSIS — R109 Unspecified abdominal pain: Secondary | ICD-10-CM

## 2022-09-30 DIAGNOSIS — R1084 Generalized abdominal pain: Secondary | ICD-10-CM | POA: Insufficient documentation

## 2022-09-30 DIAGNOSIS — Z91010 Allergy to peanuts: Secondary | ICD-10-CM | POA: Insufficient documentation

## 2022-09-30 LAB — COMPREHENSIVE METABOLIC PANEL
ALT: 10 U/L (ref 0–44)
AST: 10 U/L — ABNORMAL LOW (ref 15–41)
Albumin: 4.1 g/dL (ref 3.5–5.0)
Alkaline Phosphatase: 78 U/L (ref 38–126)
Anion gap: 10 (ref 5–15)
BUN: 7 mg/dL (ref 6–20)
CO2: 24 mmol/L (ref 22–32)
Calcium: 8.8 mg/dL — ABNORMAL LOW (ref 8.9–10.3)
Chloride: 107 mmol/L (ref 98–111)
Creatinine, Ser: 0.86 mg/dL (ref 0.44–1.00)
GFR, Estimated: >60 mL/min (ref >60)
Glucose, Bld: 97 mg/dL (ref 70–99)
Potassium: 3.4 mmol/L — ABNORMAL LOW (ref 3.5–5.1)
Sodium: 141 mmol/L (ref 135–145)
Total Bilirubin: 0.3 mg/dL (ref 0.3–1.2)
Total Protein: 6.8 g/dL (ref 6.5–8.1)

## 2022-09-30 LAB — CBC WITH DIFFERENTIAL/PLATELET
Abs Immature Granulocytes: 0.03 10*3/uL (ref 0.00–0.07)
Basophils Absolute: 0 10*3/uL (ref 0.0–0.1)
Basophils Relative: 0 %
Eosinophils Absolute: 0.2 10*3/uL (ref 0.0–0.5)
Eosinophils Relative: 2 %
HCT: 35.5 % — ABNORMAL LOW (ref 36.0–46.0)
Hemoglobin: 11.7 g/dL — ABNORMAL LOW (ref 12.0–15.0)
Immature Granulocytes: 0 %
Lymphocytes Relative: 30 %
Lymphs Abs: 3.3 10*3/uL (ref 0.7–4.0)
MCH: 24.2 pg — ABNORMAL LOW (ref 26.0–34.0)
MCHC: 33 g/dL (ref 30.0–36.0)
MCV: 73.5 fL — ABNORMAL LOW (ref 80.0–100.0)
Monocytes Absolute: 0.6 10*3/uL (ref 0.1–1.0)
Monocytes Relative: 6 %
Neutro Abs: 6.9 10*3/uL (ref 1.7–7.7)
Neutrophils Relative %: 62 %
Platelets: 364 10*3/uL (ref 150–400)
RBC: 4.83 MIL/uL (ref 3.87–5.11)
RDW: 16.8 % — ABNORMAL HIGH (ref 11.5–15.5)
WBC: 11 10*3/uL — ABNORMAL HIGH (ref 4.0–10.5)
nRBC: 0 % (ref 0.0–0.2)

## 2022-09-30 LAB — URINALYSIS, ROUTINE W REFLEX MICROSCOPIC
Bilirubin Urine: NEGATIVE
Glucose, UA: NEGATIVE mg/dL
Hgb urine dipstick: NEGATIVE
Ketones, ur: NEGATIVE mg/dL
Leukocytes,Ua: NEGATIVE
Nitrite: NEGATIVE
Protein, ur: NEGATIVE mg/dL
Specific Gravity, Urine: 1.015 (ref 1.005–1.030)
pH: 6.5 (ref 5.0–8.0)

## 2022-09-30 LAB — RAPID URINE DRUG SCREEN, HOSP PERFORMED
Amphetamines: NOT DETECTED
Barbiturates: NOT DETECTED
Benzodiazepines: POSITIVE — AB
Cocaine: NOT DETECTED
Opiates: POSITIVE — AB
Tetrahydrocannabinol: NOT DETECTED

## 2022-09-30 LAB — PREGNANCY, URINE: Preg Test, Ur: NEGATIVE

## 2022-09-30 LAB — LIPASE, BLOOD: Lipase: 32 U/L (ref 11–51)

## 2022-09-30 MED ORDER — KETOROLAC TROMETHAMINE 15 MG/ML IJ SOLN: 15.0000 mg | Freq: Once | INTRAMUSCULAR | Status: DC

## 2022-09-30 MED ORDER — ONDANSETRON HCL 4 MG PO TABS
4.0000 mg | ORAL_TABLET | Freq: Once | ORAL | Status: AC
  Administered 2022-09-30: 4 mg via ORAL
  Filled 2022-09-30: qty 1

## 2022-09-30 MED ORDER — ACETAMINOPHEN 325 MG PO TABS
650.0000 mg | ORAL_TABLET | Freq: Once | ORAL | Status: AC
  Administered 2022-09-30: 650 mg via ORAL
  Filled 2022-09-30: qty 2

## 2022-09-30 MED ORDER — OXYCODONE-ACETAMINOPHEN 5-325 MG PO TABS: 1.0000 | ORAL_TABLET | Freq: Once | ORAL | Status: DC

## 2022-09-30 MED ORDER — ONDANSETRON HCL 4 MG/2ML IJ SOLN: 4.0000 mg | Freq: Once | INTRAMUSCULAR | Status: DC

## 2022-09-30 NOTE — ED Provider Notes (Signed)
[place] EMERGENCY DEPARTMENT AT [facility] Provider Note  CSN: [id number] Arrival date & time: 09/30/22 0015  Chief Complaint(s) Abdominal Pain  HPI Madison Herzog is a 27 y.o. female with past medical history as below, significant for cluster B personality disorder, celiac disease, endometriosis, hypertension, lupus, arthritis, bipolar disorder, benzodiazepine abuse, acetaminophen overdose and appendectomy, cholecystectomy who presents to the ED with complaint of abdominal pain, nausea and vomiting.  Patient has been admitted twice in the past week with similar complaint.  She had EGD performed 9/2 by Dr. Barron Alvine which was unremarkable.  Was seen in the ER 9/4 and eloped.  Was then seen again 9/5 and was admitted but left AMA from the hospital because her pain was not well-controlled.  She returns today given ongoing pain and ongoing nausea and vomiting.  Abdominal pain is diffuse, similar to prior.  No change in bowel or bladder function.  She has a Tupperware container full of a right red liquid which she reports was her vomit.       Past Medical History 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   Collagen vascular disease (HCC)    Diarrhea    Patient mentions diagnosis of ulcerative colitis but it is not clear she actually has UC   Endometriosis    Hypertension 09/27/2022   Lupus (HCC)    Ovarian cyst    Panic disorder 05/30/2020   Pulmonary embolus (HCC) 02/06/2020   Seizures (HCC)    Patient Active Problem List   Diagnosis Date Noted   Microcytic anemia 09/28/2022   Iron deficiency 09/28/2022   Prolonged QT interval 09/27/2022   Hypocalcemia 09/27/2022   Hypertension 09/27/2022   Elevated troponin 09/27/2022   Generalized abdominal pain 09/27/2022   Chronic bilateral lower abdominal pain 09/27/2022   Abdominal pain, epigastric 09/24/2022   Nausea and vomiting 09/24/2022    History of gastritis 09/23/2022   Concern for Mallory-Weiss tear 09/23/2022   Intractable nausea and vomiting 09/23/2022   Metabolic acidosis 09/23/2022   Hematemesis with nausea 09/23/2022   GI bleeding 07/28/2022   Contusion of left hand 06/21/2022   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   Bipolar II disorder, most recent episode major depressive (HCC) 08/25/2021   Altered mental status    Overdose 08/23/2021   Intentional acetaminophen overdose (HCC)    Xanax overdose    Tachycardia 08/05/2020   Anxiety    Hematemesis 06/17/2020   MDD (major depressive disorder), recurrent severe, 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   Panic disorder 05/30/2020   MDD (major depressive disorder) 05/29/2020   Suicide attempt by acetaminophen overdose (HCC) 05/29/2020   Obesity, Class III, BMI 40-49.9 (morbid obesity) (HCC) 04/05/2020   SOB (shortness of breath) 04/05/2020   Intractable abdominal pain 04/04/2020   SLE (systemic lupus erythematosus related syndrome) (HCC) 04/04/2020   Shortness of breath 04/04/2020   Pneumonia 03/23/2020   Nausea 03/23/2020   Asthma 03/22/2020   Dysuria 02/03/2020   Hematuria 02/03/2020   Mass of urinary bladder 01/26/2020   Allergic rhinitis 05/05/2019   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 Authorizing Provider  dicyclomine (BENTYL) 20 MG tablet Take 1 tablet (20 mg total) by mouth 2 (two) times daily. 11/29/18 04/11/19  Muthersbaugh, Dahlia Client, PA-C  Fluticasone Propionate HFA (FLOVENT HFA IN) Inhale into the lungs. Patient not taking: Reported  on 11/14/2019  11/14/19  [provider]                                                                                                                                    Past Surgical History Past Surgical History:  Procedure Laterality Date   APPENDECTOMY     CHOLECYSTECTOMY  2015   ESOPHAGOGASTRODUODENOSCOPY Left 09/24/2022   Procedure: ESOPHAGOGASTRODUODENOSCOPY (EGD);  Surgeon: Shellia Cleverly, DO;  Location: [facility] ENDOSCOPY;  Service: Gastroenterology;  Laterality: Left;   ESOPHAGOGASTRODUODENOSCOPY (EGD) WITH PROPOFOL N/A 06/18/2020   Procedure: ESOPHAGOGASTRODUODENOSCOPY (EGD) WITH PROPOFOL;  Surgeon: Sherrilyn Rist, MD;  Location: [facility] ENDOSCOPY;  Service: Gastroenterology;  Laterality: N/A;   EXCISION OF ENDOMETRIOMA     Ovarian cyst removal   excision of endometriosis     FOOT FRACTURE SURGERY     w/ hardware   HERNIA REPAIR     TONSILLECTOMY     Family History Family History  Problem Relation Age of Onset   Hypertension Mother    Hypercholesterolemia Mother    Rheum arthritis Mother    Diabetes Father    Hypertension Father    Cancer Father    Autism Brother    Cancer Maternal Grandmother        Ovarian   Breast cancer Other        Paternal great aunt's breast cancer   Cancer Other        patneral great aunt's ovarian cancer    Social History Social History   Tobacco Use   Smoking status: Never   Smokeless tobacco: Never  Vaping Use   Vaping status: Never Used  Substance Use Topics   Alcohol use: Never   Drug use: Never   Allergies Amoxicillin, Azithromycin, Fish-derived products, Peanut-containing drug products, Gluten meal, Iodinated contrast media, Other, Iodine, and Lactose intolerance (gi)  Review of Systems Review of Systems  Constitutional:  Negative for chills and fever.  HENT:  Negative for congestion.   Respiratory:  Negative for chest tightness and shortness of breath.   Cardiovascular:  Negative for palpitations and leg  swelling.  Gastrointestinal:  Positive for abdominal pain, nausea and vomiting. Negative for diarrhea.  Genitourinary:  Negative for difficulty urinating and urgency.  Musculoskeletal:  Negative for arthralgias.  Skin:  Negative for wound.  All other systems reviewed and are negative.   Physical Exam Vital Signs  I have reviewed the triage vital signs BP (!) 129/93   Pulse (!) 103   Temp 98 F (36.7 C)   Resp 18   SpO2 97%  Physical Exam Vitals and nursing note reviewed.  Constitutional:      General: She is not in acute distress.    Appearance: Normal appearance. She is well-developed. She is obese.  HENT:     Head: Normocephalic and atraumatic.     Right Ear: External ear normal.     Left Ear: External ear normal.     Nose: Nose normal.     Mouth/Throat:     Mouth: Mucous membranes are moist.  Eyes:     General: No scleral icterus.       Right eye: No discharge.        Left eye: No discharge.  Cardiovascular:     Rate and Rhythm: Normal rate and regular rhythm.     Pulses: Normal pulses.     Heart sounds: Normal heart sounds.  Pulmonary:     Effort: Pulmonary effort is normal. No respiratory distress.     Breath sounds: Normal breath sounds. No stridor.  Abdominal:     General: Abdomen is flat. There is no distension.     Palpations: Abdomen is soft.     Tenderness: There is generalized abdominal tenderness.     Comments: Non peritoneal   Musculoskeletal:     Cervical back: No rigidity.     Right lower leg: No edema.     Left lower leg: No edema.  Skin:    General: Skin is warm and dry.     Capillary Refill: Capillary refill takes less than 2 seconds.  Neurological:     Mental Status: She is alert.  Psychiatric:        Mood and Affect: Mood normal.        Behavior: Behavior normal. Behavior is cooperative.     ED Results and Treatments Labs (all labs ordered are listed, but only abnormal results are displayed) Labs Reviewed  CBC WITH  DIFFERENTIAL/PLATELET - Abnormal; Notable for the following components:      Result Value   WBC 11.0 (*)    Hemoglobin 11.7 (*)    HCT 35.5 (*)    MCV 73.5 (*)    MCH 24.2 (*)    RDW 16.8 (*)    All other components within normal limits  COMPREHENSIVE METABOLIC PANEL - Abnormal; Notable for the following components:   Potassium 3.4 (*)    Calcium 8.8 (*)    AST 10 (*)    All other components within normal limits  RAPID URINE DRUG SCREEN, HOSP PERFORMED - Abnormal; Notable for the following components:   Opiates POSITIVE (*)    Benzodiazepines POSITIVE (*)    All other components within normal limits  LIPASE, BLOOD  URINALYSIS, ROUTINE W REFLEX MICROSCOPIC  PREGNANCY, URINE                                                                                                                          Radiology ECHOCARDIOGRAM COMPLETE  Result Date: 09/29/2022    ECHOCARDIOGRAM REPORT   Patient Name:   Betrice CHANI Wojtaszek  Date of Exam: 09/29/2022 Medical Rec #:  [id number]         Height:       62.0 in Accession #:    [id number]        Weight:       240.0 lb Date of Birth:  1995-06-12         BSA:          2.066 m Patient Age:    27 years          BP:           132/79 mmHg Patient Gender: F                 HR:           90 bpm. Exam Location:  Inpatient Procedure: 2D Echo, Color Doppler and Cardiac Doppler Indications:    Elevated Troponin  History:        Patient has prior history of Echocardiogram examinations, most                 recent 04/05/2020. SLE, h/o PE; Risk Factors:Hypertension.  Sonographer:    Milbert Coulter Referring Phys: [id number] DAVID MANUEL ORTIZ IMPRESSIONS  1. Left ventricular ejection fraction, by estimation, is 60 to 65%. The left ventricle has normal function. The left ventricle has no regional wall motion abnormalities. Left ventricular diastolic parameters were normal.  2. Right ventricular systolic function is normal. The right ventricular size is normal.  3. The mitral valve is  normal in structure. Trivial mitral valve regurgitation. No evidence of mitral stenosis.  4. The aortic valve is tricuspid. Aortic valve regurgitation is not visualized. No aortic stenosis is present.  5. The inferior vena cava is normal in size with greater than 50% respiratory variability, suggesting right atrial pressure of 3 mmHg. FINDINGS  Left Ventricle: Left ventricular ejection fraction, by estimation, is 60 to 65%. The left ventricle has normal function. The left ventricle has no regional wall motion abnormalities. The left ventricular internal cavity size was normal in size. There is  no left ventricular hypertrophy. Left ventricular diastolic parameters were normal. Right Ventricle: The right ventricular size is normal. No increase in right ventricular wall thickness. Right ventricular systolic function is normal. Left Atrium: Left atrial size was normal in size. Right Atrium: Right atrial size was normal in size. Pericardium: There is no evidence of pericardial effusion. Mitral Valve: The mitral valve is normal in structure. Trivial mitral valve regurgitation. No evidence of mitral valve stenosis. Tricuspid Valve: The tricuspid valve is normal in structure. Tricuspid valve regurgitation is not demonstrated. No evidence of tricuspid stenosis. Aortic Valve: The aortic valve is tricuspid. Aortic valve regurgitation is not visualized. No aortic stenosis is present. Aortic valve mean gradient measures 4.0 mmHg. Aortic valve peak gradient measures 8.3 mmHg. Aortic valve area, by VTI measures 2.64 cm. Pulmonic Valve: The pulmonic valve was normal in structure. Pulmonic valve regurgitation is trivial. No evidence of pulmonic stenosis. Aorta: The aortic root is normal in size and structure. Venous: The inferior vena cava is normal in size with greater than 50% respiratory variability, suggesting right atrial pressure of 3 mmHg. IAS/Shunts: The interatrial septum was not well visualized.  LEFT VENTRICLE PLAX 2D  LVIDd:         4.10 cm   Diastology LVIDs:         2.30 cm   LV e' medial:    6.96 cm/s LV PW:  1.00 cm   LV E/e' medial:  11.0 LV IVS:        1.00 cm   LV e' lateral:   10.70 cm/s LVOT diam:     2.00 cm   LV E/e' lateral: 7.1 LV SV:         62 LV SV Index:   30 LVOT Area:     3.14 cm  RIGHT VENTRICLE RV Basal diam:  3.20 cm RV Mid diam:    2.70 cm RV S prime:     12.30 cm/s TAPSE (M-mode): 2.0 cm LEFT ATRIUM             Index        RIGHT ATRIUM           Index LA diam:        3.70 cm 1.79 cm/m   RA Area:     12.40 cm LA Vol (A2C):   37.1 ml 17.96 ml/m  RA Volume:   25.60 ml  12.39 ml/m LA Vol (A4C):   23.1 ml 11.18 ml/m LA Biplane Vol: 30.5 ml 14.76 ml/m  AORTIC VALVE AV Area (Vmax):    2.49 cm AV Area (Vmean):   2.53 cm AV Area (VTI):     2.64 cm AV Vmax:           144.00 cm/s AV Vmean:          96.400 cm/s AV VTI:            0.236 m AV Peak Grad:      8.3 mmHg AV Mean Grad:      4.0 mmHg LVOT Vmax:         114.00 cm/s LVOT Vmean:        77.600 cm/s LVOT VTI:          0.198 m LVOT/AV VTI ratio: 0.84  AORTA Ao Root diam: 2.80 cm Ao Asc diam:  2.70 cm MITRAL VALVE MV Area (PHT): 4.49 cm    SHUNTS MV Decel Time: 169 msec    Systemic VTI:  0.20 m MV E velocity: 76.30 cm/s  Systemic Diam: 2.00 cm MV A velocity: 67.30 cm/s MV E/A ratio:  1.13 Charlton Haws MD Electronically signed by Charlton Haws MD Signature Date/Time: 09/29/2022/10:43:18 AM    Final     Pertinent labs & imaging results that were available during my care of the patient were reviewed by me and considered in my medical decision making (see MDM for details).  Medications Ordered in ED Medications  ondansetron (ZOFRAN) tablet 4 mg (has no administration in time range)  acetaminophen (TYLENOL) tablet 650 mg (has no administration in time range)                                                                                                                                     Procedures Procedures  Emergency Ultrasound Study:    Angiocath insertion  Performed by: Sloan Leiter  Consent: Verbal consent obtained. Risks and benefits: risks, benefits and alternatives were discussed Immediately prior to procedure the correct patient, procedure, equipment, support staff and site/side marked as needed.  Indication: difficult IV access Preparation: Patient was prepped and draped in the usual sterile fashion. Vein Location: The median antecubital vein was visualized during assessment for potential access sites and was found to be patent/ easily compressed with linear ultrasound.  The needle was visualized with real-time ultrasound and guided into the vein. Gauge: 20g  Images were not saved due to the time sensitive nature of the situation. Normal blood return.  Patient tolerance: Patient tolerated the procedure well with no immediate complications.  (including critical care time)  Medical Decision Making / ED Course    Medical Decision Making:    Francella Crehan is a 27 y.o. female with past medical history as below, significant for cluster B personality disorder, celiac disease, endometriosis, hypertension, lupus, arthritis, bipolar disorder, benzodiazepine abuse, acetaminophen overdose and appendectomy, cholecystectomy who presents to the ED with complaint of abdominal pain, nausea and vomiting.. The complaint involves an extensive differential diagnosis and also carries with it a high risk of complications and morbidity.  Serious etiology was considered. Ddx includes but is not limited to: Differential diagnosis includes but is not exclusive to acute cholecystitis, intrathoracic causes for epigastric abdominal pain, gastritis, duodenitis, pancreatitis, small bowel or large bowel obstruction, abdominal aortic aneurysm, hernia, gastritis, etc.   Complete initial physical exam performed, notably the patient  was NAD, sitting upright, speaking comfortably, HDS.    Reviewed and confirmed nursing documentation for past  medical history, family history, social history.  Vital signs reviewed.    Clinical Course as of 09/30/22 0720  Wynelle Link Sep 30, 2022  0709 Patient has not had any episodes of emesis in approximately 6 hours [SG]    Clinical Course User Index [SG] Sloan Leiter, DO     Patient here with abdominal pain, nausea and vomiting.  She has a container of red liquid and Tupperware that was reportedly vomit, close examination appears to be a strawberry based liquid, gastro-occult negative, does not appear to be c/w emesis.  She had normal endoscopy around a week ago.  Reports that she has been rationing her home narcotics due to nausea and vomiting.  Recent left AMA from admission because of inadequate pain control.   PDMP reviewed. She previous reports she previously followed with pain management but no longer.  Given this I am concerned for possible pain seeking behavior, possible underlying psychiatric disturbance contributing to her presentation today.  Labs reviewed, these are stable.  Her abdomen is benign.  Stable labs.  Recent CT imaging and endoscopy was stable.  Do not see utility of repeating CT imaging at this time.  On recheck patient is asleep in the room, reports feeling better.  Concerned that providing further narcotic pain medications will be detrimental to the patient's overall clinical picture and I advised patient I would not be providing any narcotic pain medication and recommend she follow-up with gastroenterology and her primary care doctor.  FYI placed in patient's chart, concern for malingering and drug-seeking behavior  The patient improved significantly and was discharged in stable condition. Detailed discussions were had with the patient regarding current findings, and need for close f/u with PCP or on call doctor. The patient has been instructed to return immediately if the symptoms worsen in any way for re-evaluation. Patient verbalized understanding and is in  agreement with  current care plan. All questions answered prior to discharge.            Additional history obtained: -Additional history obtained from na -External records from outside source obtained and reviewed including: Chart review including previous notes, labs, imaging, consultation notes including  PDMP, prior labs and imaging, prior admissions   Lab Tests: -I ordered, reviewed, and interpreted labs.   The pertinent results include:   Labs Reviewed  CBC WITH DIFFERENTIAL/PLATELET - Abnormal; Notable for the following components:      Result Value   WBC 11.0 (*)    Hemoglobin 11.7 (*)    HCT 35.5 (*)    MCV 73.5 (*)    MCH 24.2 (*)    RDW 16.8 (*)    All other components within normal limits  COMPREHENSIVE METABOLIC PANEL - Abnormal; Notable for the following components:   Potassium 3.4 (*)    Calcium 8.8 (*)    AST 10 (*)    All other components within normal limits  RAPID URINE DRUG SCREEN, HOSP PERFORMED - Abnormal; Notable for the following components:   Opiates POSITIVE (*)    Benzodiazepines POSITIVE (*)    All other components within normal limits  LIPASE, BLOOD  URINALYSIS, ROUTINE W REFLEX MICROSCOPIC  PREGNANCY, URINE    Notable for stable labs  EKG   EKG Interpretation Date/Time:    Ventricular Rate:    PR Interval:    QRS Duration:    QT Interval:    QTC Calculation:   R Axis:      Text Interpretation:           Imaging Studies ordered: na   Medicines ordered and prescription drug management: Meds ordered this encounter  Medications   DISCONTD: ketorolac (TORADOL) 15 MG/ML injection 15 mg   DISCONTD: ondansetron (ZOFRAN) injection 4 mg   ondansetron (ZOFRAN) tablet 4 mg   DISCONTD: oxyCODONE-acetaminophen (PERCOCET/ROXICET) 5-325 MG per tablet 1 tablet   acetaminophen (TYLENOL) tablet 650 mg    -I have reviewed the patients home medicines and have made adjustments as needed   Consultations Obtained: na   Cardiac  Monitoring: Continuous pulse oximetry interpreted by myself, 98% on RA.    Social Determinants of Health:  Diagnosis or treatment significantly limited by social determinants of health: obesity   Reevaluation: After the interventions noted above, I reevaluated the patient and found that they have improved  Co morbidities that complicate the patient evaluation  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   Collagen vascular disease (HCC)    Diarrhea    Patient mentions diagnosis of ulcerative colitis but it is not clear she actually has UC   Endometriosis    Hypertension 09/27/2022   Lupus (HCC)    Ovarian cyst    Panic disorder 05/30/2020   Pulmonary embolus (HCC) 02/06/2020   Seizures (HCC)       Dispostion: Disposition decision including need for hospitalization was considered, and patient discharged from emergency department.    Final Clinical Impression(s) / ED Diagnoses Final diagnoses:  Abdominal pain, unspecified abdominal location        Sloan Leiter, DO 09/30/22 0721    Sloan Leiter, DO 10/01/22 [phone]

## 2022-09-30 NOTE — Discharge Instructions (Addendum)
Please follow up with gastroenterology  It was a pleasure caring for you today in the emergency department.  Please return to the emergency department for any worsening or worrisome symptoms.

## 2022-09-30 NOTE — ED Notes (Signed)
Pt discharged home and given discharge paperwork. Opportunities given for questions. Pt verbalizes understanding. PIV removed x1. Stone,Heather R , RN 

## 2022-09-30 NOTE — ED Triage Notes (Signed)
P to triage via EMS c/o abd pain 10/10 sharp in nature x 3 days. Pt was hospitalized for same this week and left AMA earlier today. Pt a/o x 4 skin warm dry intact. VSS NAD

## 2022-10-08 IMAGING — CT CT ANGIO CHEST
2 of 6 series · 17 of 46 positions shown · IV contrast (OMNIPAQUE 350)
Comparison: None.

CT abdomen and pelvis [DATE] [DATE], [DATE] [DATE]; chest CT March 18, 2020

CLINICAL DATA: Chest pain.  Abdomen pain.

EXAM:
CT ANGIOGRAPHY CHEST
CT ABDOMEN AND PELVIS WITH CONTRAST
TECHNIQUE: Multidetector CT imaging of the chest was performed using the
standard protocol during bolus administration of intravenous
contrast. Multiplanar CT image reconstructions and MIPs were
obtained to evaluate the vascular anatomy. Multidetector CT imaging
of the abdomen and pelvis was performed using the standard protocol
during bolus administration of intravenous contrast.
CONTRAST:  100mL OMNIPAQUE IOHEXOL 350 MG/ML SOLN

[Series 3: thins · axial · 0.70mm/px · z∈[+1231,+1465]mm · 14 of 258 slices shown]
[im 12/258  lung]
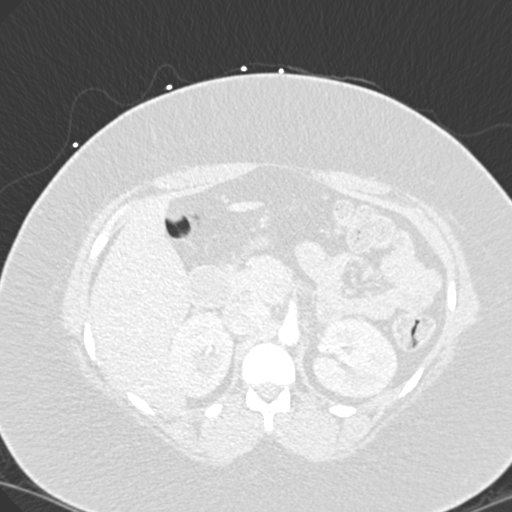
[im 34/258  soft-tissue]
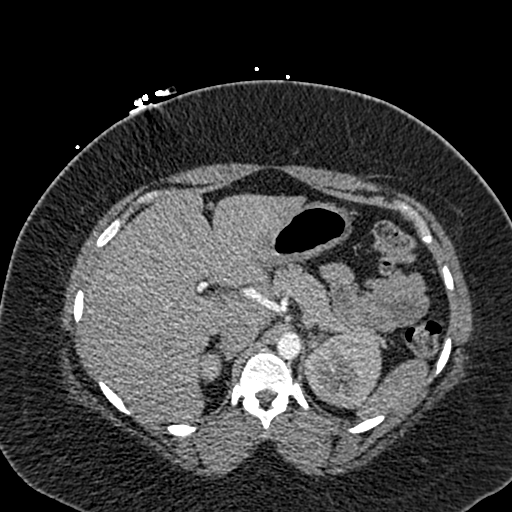
[im 45/258  lung]
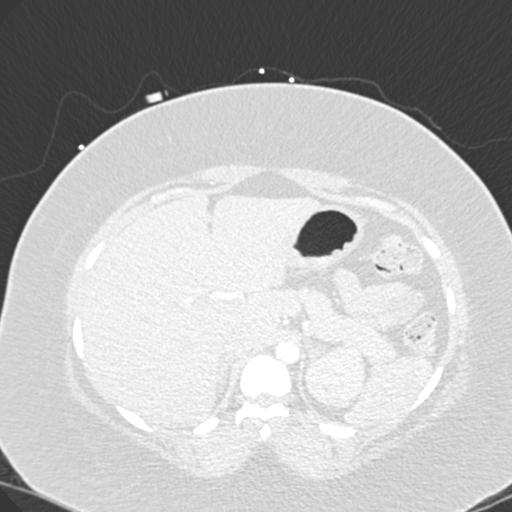
[im 68/258  soft-tissue]
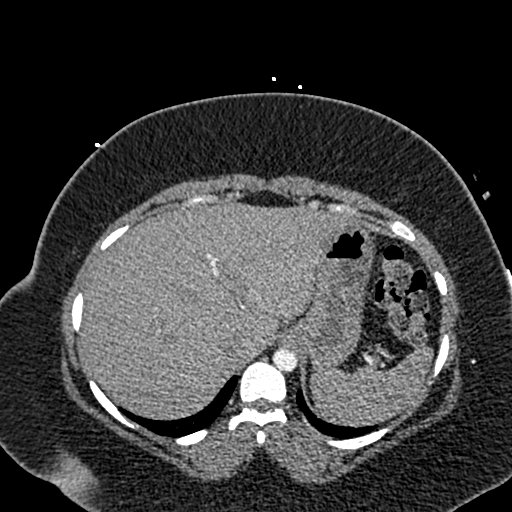
[im 90/258  lung]
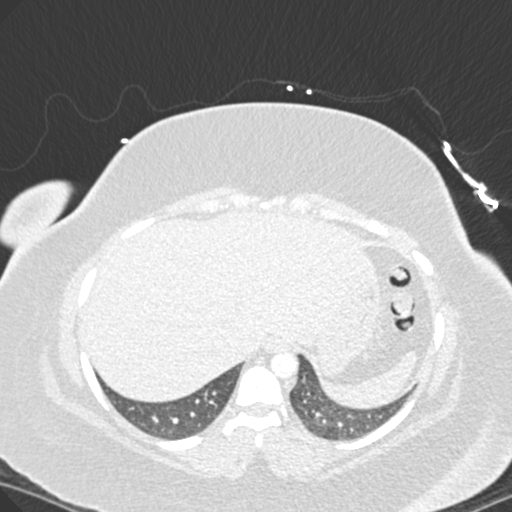
[im 101/258  soft-tissue]
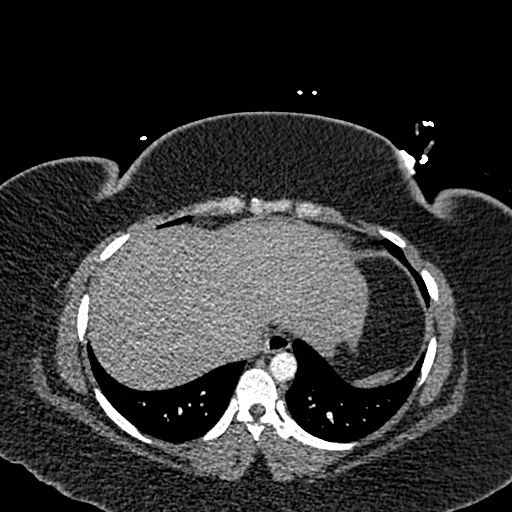
[im 123/258  lung]
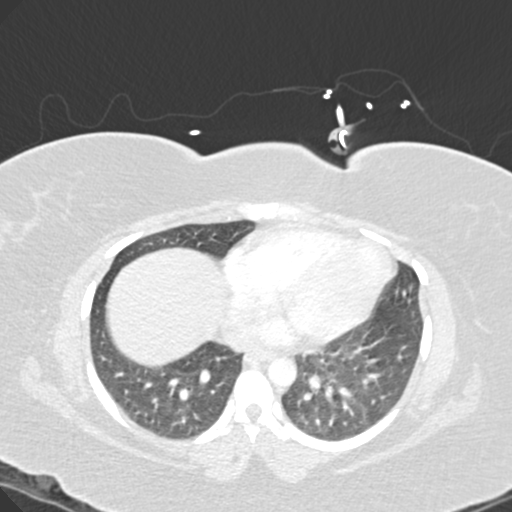
[im 135/258  soft-tissue]
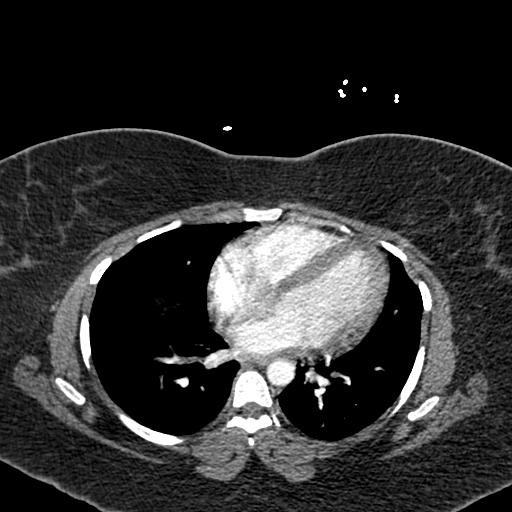
[im 157/258  lung]
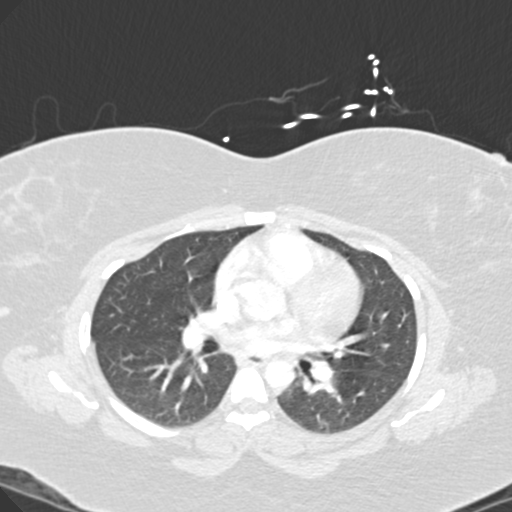
[im 168/258  soft-tissue]
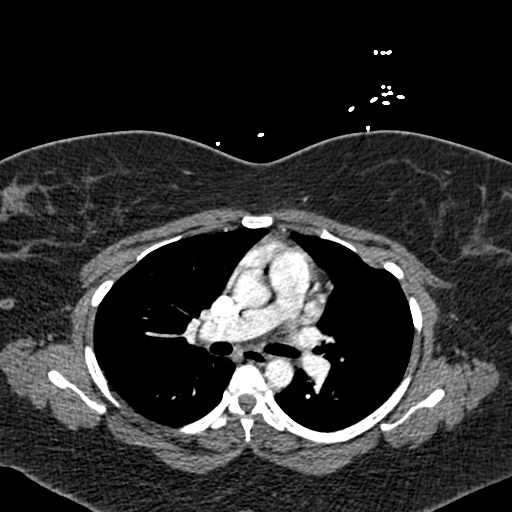
[im 190/258  lung]
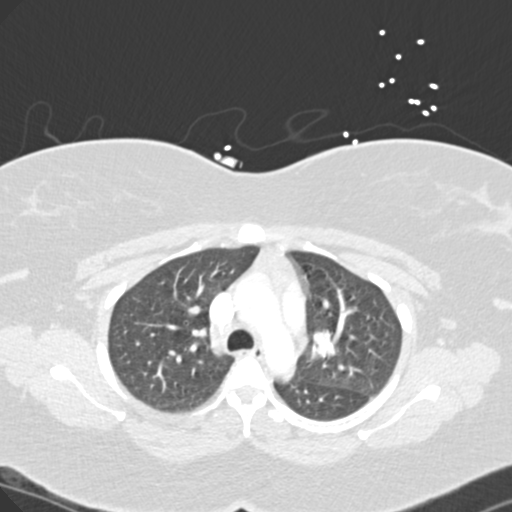
[im 213/258  soft-tissue]
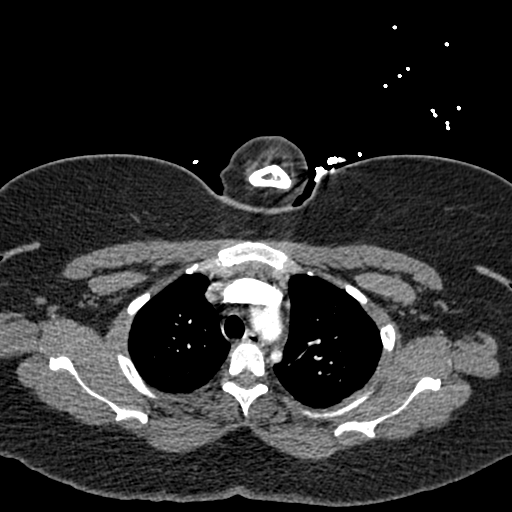
[im 224/258  lung]
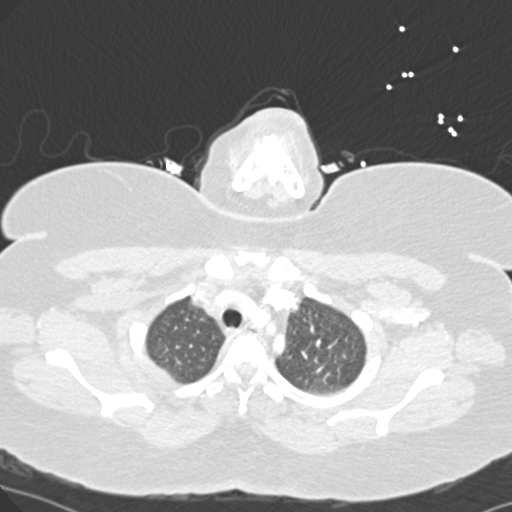
[im 246/258  soft-tissue]
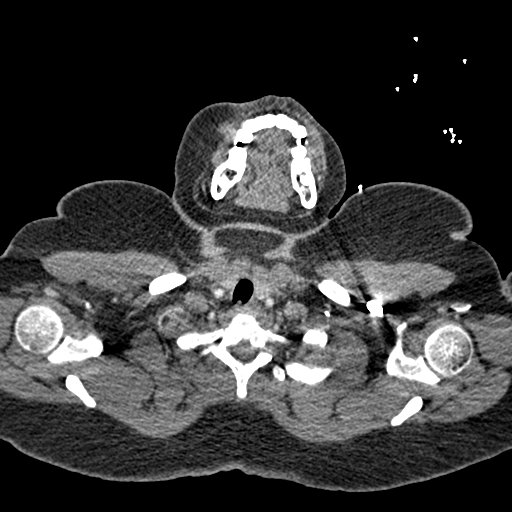

[Series 6: coronal mpr · coronal · 0.53mm/px · 3 of 141 slices shown]
[im 36/141  soft-tissue]
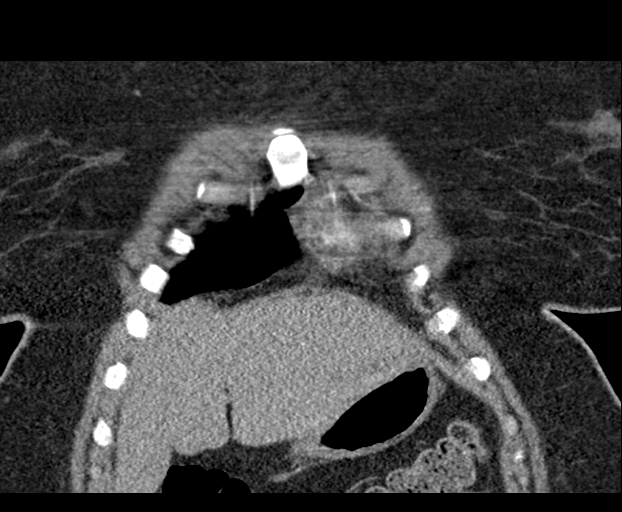
[im 71/141  soft-tissue]
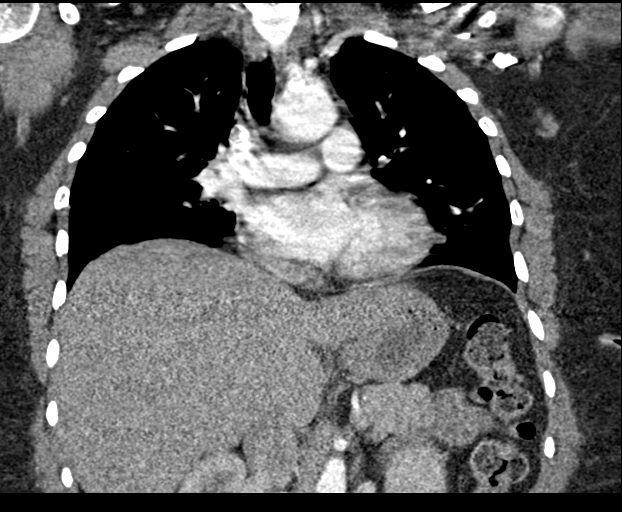
[im 106/141  soft-tissue]
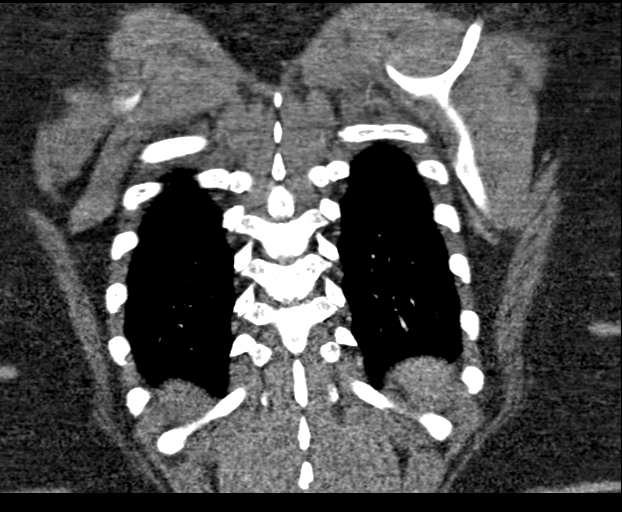

[17 of 46 positions shown; findings below may reference images not displayed]

FINDINGS: CTA CHEST FINDINGS

Cardiovascular: There is no evident pulmonary embolus. No thoracic
aortic aneurysm or dissection. Visualized great vessels appear
normal. No thoracic aortic aneurysm or dissection.

Mediastinum/Nodes: Thyroid appears normal. Small amount of thymic
tissue is normal for age. No evident thoracic adenopathy. No
esophageal lesions evident.

Lungs/Pleura: Interval clearing of infiltrate compared to prior CT.
Lungs now clear. No pleural effusion. No pneumothorax. Trachea and
major bronchial structures appear patent.

Musculoskeletal: No blastic or lytic bone lesions.

Review of the MIP images confirms the above findings.

CT ABDOMEN and PELVIS FINDINGS

Hepatobiliary: There is a 5 mm cyst in the anterior segment right
lobe of the liver. No other focal liver lesions are evident.
Gallbladder is absent. There is no appreciable biliary dilatation.

Pancreas: There is no pancreatic mass or inflammatory focus.

Spleen: No splenic lesions are evident.

Adrenals/Urinary Tract: Adrenals bilaterally appear normal. There is
no appreciable renal mass or hydronephrosis on either side. There is
no evident renal or ureteral calculus on either side. Urinary
bladder is midline with wall thickness within normal limits.

Stomach/Bowel: There is no appreciable bowel wall or mesenteric
thickening. Moderate stool noted in the colon. No bowel obstruction.
Terminal ileum appears normal. No free air or portal venous air.
Appendix absent. No periappendiceal region inflammation.

Vascular/Lymphatic: No abdominal aortic aneurysm. No arterial
vascular lesions are evident. Major venous structures appear patent.
There is no evident adenopathy in the abdomen or pelvis.

Reproductive: The uterus is anteverted.  No evident adnexal mass.

Other: No abscess or ascites in the abdomen or pelvis.

Musculoskeletal: No blastic or lytic bone lesions. No abdominal wall
or intramuscular lesions.

Review of the MIP images confirms the above findings.
IMPRESSION: CT angiogram chest:

1. No demonstrable pulmonary embolus. No thoracic aortic aneurysm or
dissection.

2.  Lungs now clear.

3.  No adenopathy.

CT abdomen and pelvis:

1. A cause for patient's symptoms has not been established with this
study.

2. No bowel wall thickening or bowel obstruction. No abscess in the
abdomen or pelvis. Appendix absent.

3.  Gallbladder absent.

4. No evident renal or ureteral calculus. No hydronephrosis. Urinary
bladder wall thickness normal.

## 2022-10-18 ENCOUNTER — Ambulatory Visit: Payer: PRIVATE HEALTH INSURANCE | Admitting: Student

## 2022-11-10 ENCOUNTER — Encounter (HOSPITAL_COMMUNITY): Payer: Self-pay

## 2022-11-10 ENCOUNTER — Inpatient Hospital Stay (HOSPITAL_COMMUNITY)
Admission: EM | Admit: 2022-11-10 | Discharge: 2022-11-16 | DRG: 760 | Disposition: A | Discharge status: Home / Self Care | Payer: BC Managed Care – PPO | Attending: Internal Medicine | Admitting: Internal Medicine

## 2022-11-10 ENCOUNTER — Emergency Department (HOSPITAL_COMMUNITY): Payer: BC Managed Care – PPO

## 2022-11-10 DIAGNOSIS — R112 Nausea with vomiting, unspecified: Secondary | ICD-10-CM | POA: Diagnosis not present

## 2022-11-10 DIAGNOSIS — Z91010 Allergy to peanuts: Secondary | ICD-10-CM

## 2022-11-10 DIAGNOSIS — F319 Bipolar disorder, unspecified: Secondary | ICD-10-CM | POA: Diagnosis present

## 2022-11-10 DIAGNOSIS — Z8041 Family history of malignant neoplasm of ovary: Secondary | ICD-10-CM

## 2022-11-10 DIAGNOSIS — Z9049 Acquired absence of other specified parts of digestive tract: Secondary | ICD-10-CM

## 2022-11-10 DIAGNOSIS — E739 Lactose intolerance, unspecified: Secondary | ICD-10-CM | POA: Diagnosis present

## 2022-11-10 DIAGNOSIS — F410 Panic disorder [episodic paroxysmal anxiety] without agoraphobia: Secondary | ICD-10-CM | POA: Diagnosis present

## 2022-11-10 DIAGNOSIS — R079 Chest pain, unspecified: Secondary | ICD-10-CM | POA: Diagnosis not present

## 2022-11-10 DIAGNOSIS — E66813 Obesity, class 3: Secondary | ICD-10-CM | POA: Diagnosis present

## 2022-11-10 DIAGNOSIS — K219 Gastro-esophageal reflux disease without esophagitis: Secondary | ICD-10-CM | POA: Diagnosis present

## 2022-11-10 DIAGNOSIS — Z833 Family history of diabetes mellitus: Secondary | ICD-10-CM

## 2022-11-10 DIAGNOSIS — Z803 Family history of malignant neoplasm of breast: Secondary | ICD-10-CM

## 2022-11-10 DIAGNOSIS — Z91041 Radiographic dye allergy status: Secondary | ICD-10-CM

## 2022-11-10 DIAGNOSIS — Z6841 Body Mass Index (BMI) 40.0 and over, adult: Secondary | ICD-10-CM

## 2022-11-10 DIAGNOSIS — E860 Dehydration: Secondary | ICD-10-CM | POA: Diagnosis present

## 2022-11-10 DIAGNOSIS — Z79899 Other long term (current) drug therapy: Secondary | ICD-10-CM

## 2022-11-10 DIAGNOSIS — D509 Iron deficiency anemia, unspecified: Secondary | ICD-10-CM | POA: Diagnosis present

## 2022-11-10 DIAGNOSIS — F329 Major depressive disorder, single episode, unspecified: Secondary | ICD-10-CM | POA: Diagnosis present

## 2022-11-10 DIAGNOSIS — Z7901 Long term (current) use of anticoagulants: Secondary | ICD-10-CM

## 2022-11-10 DIAGNOSIS — Z91148 Patient's other noncompliance with medication regimen for other reason: Secondary | ICD-10-CM

## 2022-11-10 DIAGNOSIS — Z8261 Family history of arthritis: Secondary | ICD-10-CM

## 2022-11-10 DIAGNOSIS — Z8249 Family history of ischemic heart disease and other diseases of the circulatory system: Secondary | ICD-10-CM

## 2022-11-10 DIAGNOSIS — I10 Essential (primary) hypertension: Secondary | ICD-10-CM | POA: Diagnosis present

## 2022-11-10 DIAGNOSIS — Z91018 Allergy to other foods: Secondary | ICD-10-CM

## 2022-11-10 DIAGNOSIS — M329 Systemic lupus erythematosus, unspecified: Secondary | ICD-10-CM | POA: Diagnosis present

## 2022-11-10 DIAGNOSIS — Z888 Allergy status to other drugs, medicaments and biological substances status: Secondary | ICD-10-CM

## 2022-11-10 DIAGNOSIS — D75839 Thrombocytosis, unspecified: Secondary | ICD-10-CM | POA: Diagnosis present

## 2022-11-10 DIAGNOSIS — Z83438 Family history of other disorder of lipoprotein metabolism and other lipidemia: Secondary | ICD-10-CM

## 2022-11-10 DIAGNOSIS — Z86711 Personal history of pulmonary embolism: Secondary | ICD-10-CM

## 2022-11-10 DIAGNOSIS — N809 Endometriosis, unspecified: Secondary | ICD-10-CM | POA: Diagnosis not present

## 2022-11-10 DIAGNOSIS — R109 Unspecified abdominal pain: Secondary | ICD-10-CM | POA: Diagnosis not present

## 2022-11-10 DIAGNOSIS — R1084 Generalized abdominal pain: Secondary | ICD-10-CM | POA: Diagnosis present

## 2022-11-10 DIAGNOSIS — Z880 Allergy status to penicillin: Secondary | ICD-10-CM

## 2022-11-10 DIAGNOSIS — R000 Tachycardia, unspecified: Secondary | ICD-10-CM | POA: Diagnosis not present

## 2022-11-10 DIAGNOSIS — E869 Volume depletion, unspecified: Secondary | ICD-10-CM | POA: Diagnosis not present

## 2022-11-10 DIAGNOSIS — R7303 Prediabetes: Secondary | ICD-10-CM | POA: Diagnosis present

## 2022-11-10 DIAGNOSIS — R569 Unspecified convulsions: Secondary | ICD-10-CM | POA: Diagnosis present

## 2022-11-10 DIAGNOSIS — G8929 Other chronic pain: Principal | ICD-10-CM

## 2022-11-10 DIAGNOSIS — K900 Celiac disease: Secondary | ICD-10-CM | POA: Diagnosis present

## 2022-11-10 DIAGNOSIS — J45909 Unspecified asthma, uncomplicated: Secondary | ICD-10-CM | POA: Diagnosis present

## 2022-11-10 DIAGNOSIS — Z8719 Personal history of other diseases of the digestive system: Secondary | ICD-10-CM

## 2022-11-10 DIAGNOSIS — F6089 Other specific personality disorders: Secondary | ICD-10-CM | POA: Diagnosis present

## 2022-11-10 LAB — CBC
HCT: 36.6 % (ref 36.0–46.0)
Hemoglobin: 11.7 g/dL — ABNORMAL LOW (ref 12.0–15.0)
MCH: 24 pg — ABNORMAL LOW (ref 26.0–34.0)
MCHC: 32 g/dL (ref 30.0–36.0)
MCV: 75 fL — ABNORMAL LOW (ref 80.0–100.0)
Platelets: 403 10*3/uL — ABNORMAL HIGH (ref 150–400)
RBC: 4.88 MIL/uL (ref 3.87–5.11)
RDW: 17.3 % — ABNORMAL HIGH (ref 11.5–15.5)
WBC: 7.2 10*3/uL (ref 4.0–10.5)
nRBC: 0 % (ref 0.0–0.2)

## 2022-11-10 LAB — BASIC METABOLIC PANEL
Anion gap: 10 (ref 5–15)
BUN: 8 mg/dL (ref 6–20)
CO2: 22 mmol/L (ref 22–32)
Calcium: 9.4 mg/dL (ref 8.9–10.3)
Chloride: 109 mmol/L (ref 98–111)
Creatinine, Ser: 0.82 mg/dL (ref 0.44–1.00)
GFR, Estimated: >60 mL/min (ref >60)
Glucose, Bld: 107 mg/dL — ABNORMAL HIGH (ref 70–99)
Potassium: 3.5 mmol/L (ref 3.5–5.1)
Sodium: 141 mmol/L (ref 135–145)

## 2022-11-10 LAB — HEPATIC FUNCTION PANEL
ALT: 12 U/L (ref 0–44)
AST: 14 U/L — ABNORMAL LOW (ref 15–41)
Albumin: 4.1 g/dL (ref 3.5–5.0)
Alkaline Phosphatase: 73 U/L (ref 38–126)
Bilirubin, Direct: <0.1 mg/dL (ref 0.0–0.2)
Total Bilirubin: 0.5 mg/dL (ref 0.3–1.2)
Total Protein: 7.2 g/dL (ref 6.5–8.1)

## 2022-11-10 LAB — LIPASE, BLOOD: Lipase: 30 U/L (ref 11–51)

## 2022-11-10 LAB — TROPONIN I (HIGH SENSITIVITY)
Troponin I (High Sensitivity): 3 ng/L (ref <18)
Troponin I (High Sensitivity): 2 ng/L (ref <18)

## 2022-11-10 LAB — D-DIMER, QUANTITATIVE: D-Dimer, Quant: 0.74 ug{FEU}/mL — ABNORMAL HIGH (ref 0.00–0.50)

## 2022-11-10 LAB — HCG, SERUM, QUALITATIVE: Preg, Serum: NEGATIVE

## 2022-11-10 MED ORDER — NALOXEGOL OXALATE 12.5 MG PO TABS
12.5000 mg | ORAL_TABLET | Freq: Every day | ORAL | Status: DC
  Administered 2022-11-10 – 2022-11-16 (×7): 12.5 mg via ORAL
  Filled 2022-11-10 (×7): qty 1

## 2022-11-10 MED ORDER — HYDROMORPHONE HCL 1 MG/ML IJ SOLN
1.0000 mg | Freq: Once | INTRAMUSCULAR | Status: AC
  Administered 2022-11-10: 1 mg via INTRAVENOUS
  Filled 2022-11-10: qty 1

## 2022-11-10 MED ORDER — LINACLOTIDE 145 MCG PO CAPS
145.0000 ug | ORAL_CAPSULE | Freq: Every day | ORAL | Status: DC
Start: 2022-11-11 — End: 2022-11-10

## 2022-11-10 MED ORDER — METHYLPREDNISOLONE SODIUM SUCC 40 MG IJ SOLR
40.0000 mg | Freq: Once | INTRAMUSCULAR | Status: AC
  Administered 2022-11-10: 40 mg via INTRAVENOUS
  Filled 2022-11-10: qty 1

## 2022-11-10 MED ORDER — MORPHINE SULFATE (PF) 2 MG/ML IV SOLN
1.0000 mg | INTRAVENOUS | Status: DC | PRN
  Administered 2022-11-10 – 2022-11-11 (×6): 1 mg via INTRAVENOUS
  Filled 2022-11-10 (×6): qty 1

## 2022-11-10 MED ORDER — ONDANSETRON HCL 4 MG/2ML IJ SOLN
4.0000 mg | Freq: Once | INTRAMUSCULAR | Status: AC
  Administered 2022-11-10: 4 mg via INTRAVENOUS
  Filled 2022-11-10: qty 2

## 2022-11-10 MED ORDER — HYDROXYCHLOROQUINE SULFATE 200 MG PO TABS
400.0000 mg | ORAL_TABLET | Freq: Every day | ORAL | Status: DC
  Administered 2022-11-10 – 2022-11-15 (×6): 400 mg via ORAL
  Filled 2022-11-10 (×6): qty 2

## 2022-11-10 MED ORDER — DOCUSATE SODIUM 100 MG PO CAPS
100.0000 mg | ORAL_CAPSULE | Freq: Two times a day (BID) | ORAL | Status: DC
  Administered 2022-11-10 – 2022-11-11 (×2): 100 mg via ORAL
  Filled 2022-11-10 (×2): qty 1

## 2022-11-10 MED ORDER — METHYLPREDNISOLONE SODIUM SUCC 40 MG IJ SOLR
40.0000 mg | Freq: Once | INTRAMUSCULAR | Status: AC
Start: 2022-11-10 — End: 2022-11-10
  Administered 2022-11-10: 40 mg via INTRAVENOUS
  Filled 2022-11-10: qty 1

## 2022-11-10 MED ORDER — KETAMINE HCL 50 MG/5ML IJ SOSY
0.3000 mg/kg | PREFILLED_SYRINGE | Freq: Once | INTRAMUSCULAR | Status: AC
  Administered 2022-11-10: 33 mg via INTRAVENOUS
  Filled 2022-11-10: qty 5

## 2022-11-10 MED ORDER — PANTOPRAZOLE SODIUM 40 MG PO TBEC
40.0000 mg | DELAYED_RELEASE_TABLET | Freq: Every day | ORAL | Status: DC
  Administered 2022-11-10 – 2022-11-16 (×7): 40 mg via ORAL
  Filled 2022-11-10 (×7): qty 1

## 2022-11-10 MED ORDER — SODIUM CHLORIDE 0.9% FLUSH
3.0000 mL | Freq: Two times a day (BID) | INTRAVENOUS | Status: DC
  Administered 2022-11-10 – 2022-11-16 (×11): 3 mL via INTRAVENOUS

## 2022-11-10 MED ORDER — IOHEXOL 350 MG/ML SOLN
100.0000 mL | Freq: Once | INTRAVENOUS | Status: AC | PRN
  Administered 2022-11-10: 100 mL via INTRAVENOUS

## 2022-11-10 MED ORDER — ALPRAZOLAM 0.5 MG PO TABS
1.0000 mg | ORAL_TABLET | Freq: Two times a day (BID) | ORAL | Status: DC | PRN
  Administered 2022-11-10 – 2022-11-15 (×11): 1 mg via ORAL
  Filled 2022-11-10 (×11): qty 2

## 2022-11-10 MED ORDER — DIPHENHYDRAMINE HCL 50 MG/ML IJ SOLN
25.0000 mg | Freq: Once | INTRAMUSCULAR | Status: AC
  Administered 2022-11-10: 25 mg via INTRAVENOUS
  Filled 2022-11-10: qty 1

## 2022-11-10 MED ORDER — SERTRALINE HCL 50 MG PO TABS
25.0000 mg | ORAL_TABLET | Freq: Every day | ORAL | Status: DC
  Administered 2022-11-10 – 2022-11-16 (×7): 25 mg via ORAL
  Filled 2022-11-10 (×7): qty 1

## 2022-11-10 MED ORDER — DIPHENHYDRAMINE HCL 25 MG PO CAPS: 50.0000 mg | ORAL_CAPSULE | Freq: Once | ORAL | Status: AC

## 2022-11-10 MED ORDER — KCL IN DEXTROSE-NACL 20-5-0.9 MEQ/L-%-% IV SOLN
INTRAVENOUS | Status: AC
  Filled 2022-11-10: qty 1000

## 2022-11-10 MED ORDER — DIPHENHYDRAMINE HCL 50 MG/ML IJ SOLN
50.0000 mg | Freq: Once | INTRAMUSCULAR | Status: AC
  Administered 2022-11-10: 50 mg via INTRAVENOUS
  Filled 2022-11-10: qty 1

## 2022-11-10 MED ORDER — MAGNESIUM OXIDE -MG SUPPLEMENT 400 (240 MG) MG PO TABS
400.0000 mg | ORAL_TABLET | Freq: Two times a day (BID) | ORAL | Status: AC
  Administered 2022-11-10 (×2): 400 mg via ORAL
  Filled 2022-11-10 (×2): qty 1

## 2022-11-10 MED ORDER — OXYCODONE HCL 5 MG PO TABS
5.0000 mg | ORAL_TABLET | ORAL | Status: DC | PRN
  Administered 2022-11-10 – 2022-11-16 (×27): 5 mg via ORAL
  Filled 2022-11-10 (×28): qty 1

## 2022-11-10 MED ORDER — DIPHENHYDRAMINE HCL 25 MG PO CAPS
50.0000 mg | ORAL_CAPSULE | Freq: Once | ORAL | Status: AC
  Administered 2022-11-10: 50 mg via ORAL
  Filled 2022-11-10: qty 2

## 2022-11-10 MED ORDER — POLYETHYLENE GLYCOL 3350 17 G PO PACK
17.0000 g | PACK | Freq: Every day | ORAL | Status: DC
  Administered 2022-11-11: 17 g via ORAL

## 2022-11-10 MED ORDER — IOHEXOL 350 MG/ML SOLN
75.0000 mL | Freq: Once | INTRAVENOUS | Status: AC | PRN
  Administered 2022-11-10: 75 mL via INTRAVENOUS

## 2022-11-10 MED ORDER — ACETAMINOPHEN 325 MG PO TABS: 650.0000 mg | ORAL_TABLET | Freq: Four times a day (QID) | ORAL | Status: DC | PRN

## 2022-11-10 MED ORDER — ONDANSETRON HCL 4 MG/2ML IJ SOLN
4.0000 mg | Freq: Four times a day (QID) | INTRAMUSCULAR | Status: DC
  Administered 2022-11-10 – 2022-11-16 (×24): 4 mg via INTRAVENOUS
  Filled 2022-11-10 (×24): qty 2

## 2022-11-10 MED ORDER — APIXABAN 5 MG PO TABS
10.0000 mg | ORAL_TABLET | Freq: Two times a day (BID) | ORAL | Status: DC
  Administered 2022-11-10 – 2022-11-11 (×2): 10 mg via ORAL
  Filled 2022-11-10 (×2): qty 2

## 2022-11-10 MED ORDER — DIPHENHYDRAMINE HCL 50 MG/ML IJ SOLN: 50.0000 mg | Freq: Once | INTRAMUSCULAR | Status: AC

## 2022-11-10 MED ORDER — OXYCODONE HCL 5 MG PO TABS
5.0000 mg | ORAL_TABLET | Freq: Once | ORAL | Status: AC
  Administered 2022-11-10: 5 mg via ORAL
  Filled 2022-11-10: qty 1

## 2022-11-10 MED ORDER — DIPHENHYDRAMINE HCL 50 MG/ML IJ SOLN
INTRAMUSCULAR | Status: AC
  Filled 2022-11-10: qty 1

## 2022-11-10 MED ORDER — MAGNESIUM HYDROXIDE 400 MG/5ML PO SUSP
30.0000 mL | Freq: Once | ORAL | Status: AC
  Administered 2022-11-10: 30 mL via ORAL
  Filled 2022-11-10: qty 30

## 2022-11-10 MED ORDER — APIXABAN 5 MG PO TABS: 5.0000 mg | ORAL_TABLET | Freq: Two times a day (BID) | ORAL | Status: DC

## 2022-11-10 MED ORDER — MORPHINE SULFATE (PF) 2 MG/ML IV SOLN
1.0000 mg | Freq: Once | INTRAVENOUS | Status: AC
  Administered 2022-11-10: 1 mg via INTRAVENOUS
  Filled 2022-11-10: qty 1

## 2022-11-10 MED ORDER — ACETAMINOPHEN 650 MG RE SUPP: 650.0000 mg | Freq: Four times a day (QID) | RECTAL | Status: DC | PRN

## 2022-11-10 NOTE — H&P (Addendum)
History and Physical    Patient: [name] GEX:[id number] DOB: 03-Sep-1995 DOA: 11/10/2022 DOS: the patient was seen and examined on 11/10/2022 PCP: Pcp, No - Patient coming from: Home Anticipated discharge disposition: Home  Chief Complaint:  Chief Complaint  Patient presents with   Abdominal Pain   Chest Pain   HPI: [name] is a 27 y.o. female with medical history significant of asthma, prediabetes with hemoglobin A1c 6.3 in 2022, celiac disease, cluster B personality disorder, panic disorder, collagen vascular disease, diarrhea, endometriosis with significant bowel involvement, SLE, ovarian cyst, PE January 2022, reported seizures, class III obesity with a BMI in the 40s.  Patient has had several admissions in the past 2 to 3 years at this facility due to abdominal pain with intractable nausea and vomiting and at times has had hematemesis.  She was last evaluated at our facility and left AMA on 09/29/2022.  3 days prior to that admission she had undergone an EGD with no significant findings noted.  Also during this previous admission GI was consulted.  It was suspected that given previous negative EGDs that her intermittent hematemesis had come from a possible Mallory-Weiss tear or esophagitis from repeated nausea and vomiting.  She had apparently not picked up her PPI prior to that most recent admission.  She has also had issues related to her endometriosis causing severe intractable constipation.  She was started on Linzess with consideration of transitioning to Movantik if intolerant.  She was also found to have chronic iron deficiency anemia with an iron level of 25.  Recommendation was for the patient to follow-up with GI after discharge but again she left AMA.  Patient presented to the ED today with report of abdominal pain and chest pain that started less than 24 hours prior to presentation.  She reports recurrent nausea and repeated emesis none witnessed since arrival.   She has subsequently undergone a CT of the abdomen which shows no acute finding but does show moderate stool burden.  Because of her chest pain and history of PE a CTA of the chest was attempted but images were poor and recommendation was to attempt another CTA prior to discharge.  Patient has essentially been noncompliant with her anticoagulation since initially diagnosed.  She reported that during her previous hospitalization there was some discussion regarding possible discontinuation of anticoagulation.  Because she left AMA she opted not to take the medication.    Upon my evaluation of the patient she is continuing to for abdominal pain 10/10 and reports a recent episode of emesis prior to my entry into the room.  She is bright and cheerful.  She is very talkative and has discussed her multiple medical problems.  Her primary focus is on how her endometriosis is impacting her daily life in regards to pain and issues related to chronic constipation.  She states her lupus is well-controlled on her Plaquenil and she does have a rheumatologist in [place].  She states she attends online classes due to her chronic pain.  She reports that the best she felt with her endometriosis was when she was on Zoladex every 3 months.  Unfortunately she has not followed up with any GYN providers in regards to management of this medication.  The ER has asked the hospitalist to evaluate the patient for admission.  Given her presentation and still unclarified issues regarding possible PE she will be placed in a progressive care bed.  Review of Systems: As mentioned in the history of  present illness. All other systems reviewed and are negative.  GI and GYN history:  08/2012 EGD.  At [facility] in [place].  Grossly normal esophagus, stomach, examined duodenum. Duodenum biopsied, path: normal duodenal mucosa. 2019 EGD revealed hiatal hernia, esophagitis. 12/05/2018 colonoscopy.  For diarrhea.  Dr. Genia Harold in [place]  random biopsies negative for microscopic colitis. (Unable to access procedure or pathology reports.) 11/2019 colonoscopy.  Dr. Cloretta Ned at [facility].  No colitis.  Apparently unremarkable study but unable to access report 11/2019 EGD by Dr. Cloretta Ned.  Was not advised there is any significant pathology.  Again unable to access reports 01/02/2020 CTAP without contrast/stone protocol.  Showed stable small subcentimeter hypoattenuating lesions in the hepatic dome, too small to characterize.  Pancreas, spleen, biliary tree, small bowel, colon, stomach unremarkable 02/07/2020 CTAP with contrast at the [facility] in [place].  For evaluation of abdominal, flank pain.  9 mm cystic lesion in the liver.  Pancreas, spleen, stomach, SPN: Unremarkable.  Right main pulmonary artery filling defect consistent with PE. Regarding celiac testing: 05/01/2018 for similar admission, FOBT negative 11/2018 TTG IgA was <2.  IgA was normal at 136.  TTG IgG and IgA Abs < 3 in 08/2012 Endomysial Abs negative in 08/2038.  08/2011 celiac disease report at the [facility] [place]: DQ2:  (DQB1*02) is Negative.  DQB1*02:01 or 02:02  is not Present.  DQA1*05:01 or 05:05  is not Present.  DQ8:  (DQB1*03:02) is Negative.  DQB1*03:02, DQA1*03 are Not Present.    10/2021 [facility] (?Dr Elby Showers) laparoscopic surgery and told she had endometriosis in 2 tumors removed benign unclear pathology per notes in the ER where she was having abdominal pain nausea and emesis. 02/28/2022 documented right ovarian surgery for mass in the notes, due to surgery and hypercoagulable state was on Eliquis for 2 months after but those taken off. 05/18/2022 admission [facility] for generalized abdominal pain nausea and vomiting, negative CT head  07/28/2022 Hospital admission [facility] for hematemesis and abdominal pain reviewing notes showed EGD with gastritis thought to be secondary to NSAIDs recommended Protonix and Carafate and advised  to wean off narcotics at that time, was given Movantik and MiraLAX for opioid-induced constipation.        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   Collagen vascular disease (HCC)    Diarrhea    Patient mentions diagnosis of ulcerative colitis but it is not clear she actually has UC   Endometriosis    Hypertension 09/27/2022   Lupus    Ovarian cyst    Panic disorder 05/30/2020   Pulmonary embolus (HCC) 02/06/2020   Seizures (HCC)    Past Surgical History:  Procedure Laterality Date   APPENDECTOMY     CHOLECYSTECTOMY  2015   ESOPHAGOGASTRODUODENOSCOPY Left 09/24/2022   Procedure: ESOPHAGOGASTRODUODENOSCOPY (EGD);  Surgeon: Shellia Cleverly, DO;  Location: [facility] ENDOSCOPY;  Service: Gastroenterology;  Laterality: Left;   ESOPHAGOGASTRODUODENOSCOPY (EGD) WITH PROPOFOL N/A 06/18/2020   Procedure: ESOPHAGOGASTRODUODENOSCOPY (EGD) WITH PROPOFOL;  Surgeon: Sherrilyn Rist, MD;  Location: [facility] ENDOSCOPY;  Service: Gastroenterology;  Laterality: N/A;   EXCISION OF ENDOMETRIOMA     Ovarian cyst removal   excision of endometriosis     FOOT FRACTURE SURGERY     w/ hardware   HERNIA REPAIR     TONSILLECTOMY     Social History:  reports that she has never smoked. She  has never used smokeless tobacco. She reports that she does not drink alcohol and does not use drugs.  Allergies  Allergen Reactions   Amoxicillin Anaphylaxis, Swelling and Other (See Comments)    Hand swelling (PENFAST 4+) Tolerated ceftriaxone 2021 PenFast Score 4+:  High risk of positive penicillin allergy (50%) Oral challenge NOT recommended  Cephalosporins may be considered under close monitoring   Azithromycin Anaphylaxis and Other (See Comments)   Fish-Derived Products Anaphylaxis and Other (See Comments)    Eats shellfish alright   Peanut-Containing Drug Products Hives, Itching, Rash and Other (See Comments)         Gluten Meal  Itching, Rash and Other (See Comments)    Celiac disease   Iodinated Contrast Media Hives and Itching    02/04/2020 Patient stated has history of itching and hives with CT IV Iodine contrast, premedicated with Benadryl 25 mg IV prior to CT IV Iodine scan today.Post CT scan patient reported itching, Benadryl 25 mg IV given, symptoms resolved, NP recommends for patient to get premedicated with Benadryl 50 mg IV prior to future CT IV Iodine scans.    Other Hives, Itching, Rash and Other (See Comments)    Moderna covid vaccine -Syncope - ICU 2 weeks Tree nuts- Hives, itching, rashes   Iodine Itching   Lactose Intolerance (Gi) Nausea And Vomiting and Other (See Comments)    Can tolerate, in small doses    Family History  Problem Relation Age of Onset   Hypertension Mother    Hypercholesterolemia Mother    Rheum arthritis Mother    Diabetes Father    Hypertension Father    Cancer Father    Autism Brother    Cancer Maternal Grandmother        Ovarian   Breast cancer Other        Paternal great aunt's breast cancer   Cancer Other        patneral great aunt's ovarian cancer    Prior to Admission medications   Medication Sig Start Date End Date Taking? Authorizing Provider  dicyclomine (BENTYL) 20 MG tablet Take 1 tablet (20 mg total) by mouth 2 (two) times daily. 11/29/18 04/11/19  Muthersbaugh, Dahlia Client, PA-C  Fluticasone Propionate HFA (FLOVENT HFA IN) Inhale into the lungs. Patient not taking: Reported on 11/14/2019  11/14/19  [provider]    Physical Exam: Vitals:   11/10/22 0520 11/10/22 0557 11/10/22 0840 11/10/22 1006  BP: (!) 160/126 (!) 144/116 (!) 137/105 (!) 160/116  Pulse: (!) 126 (!) 113 (!) 108 (!) 115  Resp: 20 17 16 16   Temp:  98.5 F (36.9 C)  98.5 F (36.9 C)  TempSrc:  Oral  Oral  SpO2: 100% 98% 95% 99%  Weight:      Height:       Constitutional: NAD, calm, comfortable Respiratory: clear to auscultation bilaterally, no wheezing, no crackles. Normal  respiratory effort. No accessory muscle use.  Cardiovascular: Regular rate and rhythm, no murmurs / rubs / gallops. No extremity edema. 2+ pedal pulses. Abdomen: no tenderness, no masses palpated. No hepatosplenomegaly. Bowel sounds positive.  Musculoskeletal: no clubbing / cyanosis. No joint deformity upper and lower extremities. Good ROM, no contractures. Normal muscle tone.  Skin: no rashes, lesions, ulcers. No induration Neurologic: CN 2-12 grossly intact. Sensation intact, DTR normal. Strength 5/5 x all 4 extremities.  Psychiatric: Normal judgment and insight.  Pleasant and bubbly.  Alert and oriented x 3. Normal mood.    Data Reviewed:  Sodium 141, potassium  3.5, glucose 107, BUN 8, creatinine 0.82, troponin 3, white count 7200, hemoglobin 11.7, MCV 75, platelets 403,000  CT abdomen and pelvis and CTA of chest as noted in the HPI.  Assessment and Plan: Recurrent abdominal pain in the context of chronic constipation and bowel involvement secondary to severe endometriosis Currently no definitive findings other than severe constipation that includes fecalization of the terminal ileum which is likely contributing to patient's abdominal pain Plan at this juncture is to attempt to improve constipation by the coadministration of laxatives and stool softeners.  I explained to the patient she will be given 1 dose of milk of magnesia to get the process started Patient reported Linzess in the past caused severe diarrhea so we will try Movantik as recommended by GI physician on previous admission Patient was prediabetic with a hemoglobin A1c of 6.3 two years ago and if patient does have diabetes she could have a coexisting gastroparesis further complicating her GI issues and gastric motility Will prescribe a regular diet -patient can adjust intake by drinking clear/full liquids without needing to consume a full regular tray.  This will allow her to advance diet as tolerated.  She reports a history of  gluten intolerance/celiac disease as well She is slightly tachycardic and labs appear as if she is mildly dehydrated therefore we will administer D5 normal saline with potassium at 40 cc/h for 24 hours  Chest pain and history of PE noncompliant with anticoagulation Troponin unremarkable and EKG nonischemic CTA done initially was nondiagnostic and recommendations are to repeat Will resume NOAC/Eliquis-will start with 10 mg BID for 7 days and then transition to 5 mg BID TOC consultation-patient states she does have insurance with good prescriptive benefits Check D-dimer as well as lower extremity venous duplex  Tachycardia Could be secondary to possible reemergence of PE although could be related to dehydration versus severe physical deconditioning. Patient did state that several months ago she was severely depressed and essentially just laid in the bed and only got up to go to the bathroom and shower.  At that time she had severe anxiety and did not want to leave the house. If repeat CTA is positive for PE then we will repeat echocardiogram to look at RV function.  She just had an echocardiogram last month on the seventh.  History of GERD and prior hematemesis and suspected prior Mallory-Weiss tear Resume Protonix daily and add magnesium to prevent leg cramps Magnesium will also help with constipation Check fecal occult blood  Iron deficiency anemia Iron level was 25 during previous admission Given her severe constipation regular oral iron supplementation likely would be difficult  Prediabetes/severe obesity with a BMI of 43 Repeat hemoglobin A1c to determine if she has finally tipped over into the diabetic range Patient is interested in starting Ozempic or another GLP-1 as this will assist with her weight loss but I have cautioned her about this type of medication especially with her pre-existing bowel conditions-this medicine could be contraindicated for her and could potentially lead to a  paralytic ileus.  Patient does report she has had issues with ileus in the past secondary to bowel complications from her endometriosis  SLE/collagen vascular disorder Continue Plaquenil Patient needs to establish with a rheumatologist in the [facility] area if she continues to live here  Severe endometriosis/chronic pain Patient reports significant improvement in symptoms after the administration of Zoladex.  Patient has not followed up with GYN locally.  I did explain to her that she must establish with a  GYN if she is to be considered a candidate for this medication again. Evaluated by GYN during previous hospitalization with recommendation to follow-up with Dr.Ajewole who specializes in complex endometriosis with severe bowel involvement Will likely need to contact GYN here to help arrange outpatient follow-up Patient not a candidate for NSAIDs due to history of hematemesis as well as coadministration of NOAC.  Will use combination of Tylenol, low-dose oral narcotics and low-dose IV narcotics to control pain and rapidly de-escalate with plans to discharge off of narcotics. Patient may benefit from referral to pain clinic but she will need to determine/decide residency between [facility] and [place] before this can be accomplished.  Bipolar disorder with associated cluster B personality disorder Patient states previously prescribed Celexa ineffective and is requesting to resume Zoloft I have restarted Zoloft at 25 mg daily.  I let the patient know that this medication needs to be titrated up and takes between 4 to 6 weeks to have any effect on her symptoms-she verbalized understanding Reviewed previous psych notes and patient had been prescribed Xanax 1 mg twice daily as needed which will be resumed  Asthma Really asymptomatic and apparently not on medications prior to admission   Advance Care Planning:   Code Status: Full Code   VTE prophylaxis: Eliquis  Consults: None  Family  Communication: Patient only  Severity of Illness: The appropriate patient status for this patient is OBSERVATION. Observation status is judged to be reasonable and necessary in order to provide the required intensity of service to ensure the patient's safety. The patient's presenting symptoms, physical exam findings, and initial radiographic and laboratory data in the context of their medical condition is felt to place them at decreased risk for further clinical deterioration. Furthermore, it is anticipated that the patient will be medically stable for discharge from the hospital within 2 midnights of admission.   Author: Junious Silk, NP 11/10/2022 4:32 PM  For on call review [email or web address].

## 2022-11-10 NOTE — ED Notes (Signed)
ED TO INPATIENT HANDOFF REPORT  Name/Age/Gender [name] 27 y.o. female  Code Status    Code Status Orders  (From admission, onward)           Start     Ordered   11/10/22 1556  Full code  Continuous       Question:  By:  Answer:  Consent: discussion documented in EHR   11/10/22 1556           Code Status History     Date Active Date Inactive Code Status Order ID Comments User Context   09/27/2022 1348 09/29/2022 1659 Full Code [id number]  Bobette Mo, MD Inpatient   09/23/2022 2138 09/25/2022 1544 Full Code [id number]  Tereasa Coop, MD ED   08/23/2021 0409 08/25/2021 1947 Full Code [id number]  Darlin Drop, DO ED   06/17/2020 1624 06/19/2020 0008 Full Code [id number]  Jovita Kussmaul, MD ED   05/30/2020 0121 06/01/2020 1520 Full Code [id number]  Jaclyn Shaggy, PA-C Inpatient   05/28/2020 2031 05/30/2020 0107 Full Code [id number]  Pollyann Savoy, MD ED   04/04/2020 1221 04/06/2020 2007 Full Code [id number]  Jae Dire, MD ED       Home/SNF/Other Home  Chief Complaint Intractable nausea and vomiting [R11.2]  Level of Care/Admitting Diagnosis ED Disposition     ED Disposition  Admit   Condition  --   Comment  Hospital Area: [facility] [[id number]]  Level of Care: Progressive [102]  Admit to Progressive based on following criteria: Other see comments  Comments: Tachycardia  May place patient in observation at [facility] or [facility] if equivalent level of care is available:: Yes  Covid Evaluation: Asymptomatic - no recent exposure (last 10 days) testing not required  Diagnosis: Intractable nausea and vomiting [720114]  Admitting Physician: Marguerita Merles LATIF [[id number]]  Attending Physician: Marguerita Merles LATIF [[id number]]          Medical History 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   Collagen vascular disease (HCC)    Diarrhea    Patient mentions  diagnosis of ulcerative colitis but it is not clear she actually has UC   Endometriosis    Hypertension 09/27/2022   Lupus    Ovarian cyst    Panic disorder 05/30/2020   Pulmonary embolus (HCC) 02/06/2020   Seizures (HCC)     Allergies Allergies  Allergen Reactions   Amoxicillin Anaphylaxis, Swelling and Other (See Comments)    Hand swelling (PENFAST 4+) Tolerated ceftriaxone 2021 PenFast Score 4+:  High risk of positive penicillin allergy (50%) Oral challenge NOT recommended  Cephalosporins may be considered under close monitoring   Azithromycin Anaphylaxis and Other (See Comments)   Fish-Derived Products Anaphylaxis and Other (See Comments)    Eats shellfish alright   Peanut-Containing Drug Products Hives, Itching, Rash and Other (See Comments)         Gluten Meal Itching, Rash and Other (See Comments)    Celiac disease   Iodinated Contrast Media Hives and Itching    02/04/2020 Patient stated has history of itching and hives with CT IV Iodine contrast, premedicated with Benadryl 25 mg IV prior to CT IV Iodine scan today.Post CT scan patient reported itching, Benadryl 25 mg IV given, symptoms resolved, NP recommends for patient to get premedicated with Benadryl 50 mg IV prior to future CT IV Iodine scans.    Other  Hives, Itching, Rash and Other (See Comments)    Moderna covid vaccine -Syncope - ICU 2 weeks Tree nuts- Hives, itching, rashes   Iodine Itching   Lactose Intolerance (Gi) Nausea And Vomiting and Other (See Comments)    Can tolerate, in small doses    IV Location/Drains/Wounds Patient Lines/Drains/Airways Status     Active Line/Drains/Airways     Name Placement date Placement time Site Days   Peripheral IV 11/10/22 20 G 2.5" Anterior;Right Forearm 11/10/22  1516  Forearm  less than 1            Labs/Imaging Results for orders placed or performed during the hospital encounter of 11/10/22 (from the past 48 hour(s))  Basic metabolic panel     Status:  Abnormal   Collection Time: 11/10/22  4:42 AM  Result Value Ref Range   Sodium 141 135 - 145 mmol/L   Potassium 3.5 3.5 - 5.1 mmol/L   Chloride 109 98 - 111 mmol/L   CO2 22 22 - 32 mmol/L   Glucose, Bld 107 (H) 70 - 99 mg/dL    Comment: Glucose reference range applies only to samples taken after fasting for at least 8 hours.   BUN 8 6 - 20 mg/dL   Creatinine, Ser 1.61 0.44 - 1.00 mg/dL   Calcium 9.4 8.9 - 09.6 mg/dL   GFR, Estimated >04 >54 mL/min    Comment: (NOTE) Calculated using the CKD-EPI Creatinine Equation (2021)    Anion gap 10 5 - 15    Comment: Performed at [facility], [address], [place], [place] [address]  CBC     Status: Abnormal   Collection Time: 11/10/22  4:42 AM  Result Value Ref Range   WBC 7.2 4.0 - 10.5 K/uL   RBC 4.88 3.87 - 5.11 MIL/uL   Hemoglobin 11.7 (L) 12.0 - 15.0 g/dL   HCT 91.4 78.2 - 95.6 %   MCV 75.0 (L) 80.0 - 100.0 fL   MCH 24.0 (L) 26.0 - 34.0 pg   MCHC 32.0 30.0 - 36.0 g/dL   RDW 21.3 (H) 08.6 - 57.8 %   Platelets 403 (H) 150 - 400 K/uL   nRBC 0.0 0.0 - 0.2 %    Comment: Performed at [facility], [address] [place], [place] [address]  Troponin I (High Sensitivity)     Status: None   Collection Time: 11/10/22  4:42 AM  Result Value Ref Range   Troponin I (High Sensitivity) 3 <18 ng/L    Comment: (NOTE) Elevated high sensitivity troponin I (hsTnI) values and significant  changes across serial measurements may suggest ACS but many other  chronic and acute conditions are known to elevate hsTnI results.  Refer to the "Links" section for chest pain algorithms and additional  guidance. Performed at [facility], [address] [place], [place] [address]   hCG, serum, qualitative     Status: None   Collection Time: 11/10/22  4:42 AM  Result Value Ref Range   Preg, Serum NEGATIVE NEGATIVE    Comment:        THE SENSITIVITY OF THIS METHODOLOGY IS >10 mIU/mL. Performed at  [facility], [address] [place], [place] [address]   Lipase, blood     Status: None   Collection Time: 11/10/22  4:42 AM  Result Value Ref Range   Lipase 30 11 - 51 U/L    Comment: Performed at [facility], [address] [place], [place] [address]  Hepatic function panel     Status: Abnormal   Collection Time: 11/10/22  4:55 AM  Result Value Ref Range   Total Protein 7.2 6.5 - 8.1 g/dL   Albumin 4.1 3.5 - 5.0 g/dL   AST 14 (L) 15 - 41 U/L   ALT 12 0 - 44 U/L   Alkaline Phosphatase 73 38 - 126 U/L   Total Bilirubin 0.5 0.3 - 1.2 mg/dL   Bilirubin, Direct <6.0 0.0 - 0.2 mg/dL   Indirect Bilirubin NOT CALCULATED 0.3 - 0.9 mg/dL    Comment: Performed at [facility], [address], [place], [place] [address]   CT Angio Chest PE W and/or Wo Contrast  Result Date: 11/10/2022 CLINICAL DATA:  Pulmonary embolism (PE) suspected, high prob; Abdominal pain, acute, nonlocalized. Chest pain and abdominal pain. Nausea and vomiting. EXAM: CT ANGIOGRAPHY CHEST CT ABDOMEN AND PELVIS WITH CONTRAST TECHNIQUE: Multidetector CT imaging of the chest was performed using the standard protocol during bolus administration of intravenous contrast. Multiplanar CT image reconstructions and MIPs were obtained to evaluate the vascular anatomy. Multidetector CT imaging of the abdomen and pelvis was performed using the standard protocol during bolus administration of intravenous contrast. RADIATION DOSE REDUCTION: This exam was performed according to the departmental dose-optimization program which includes automated exposure control, adjustment of the mA and/or kV according to patient size and/or use of iterative reconstruction technique. CONTRAST:  OMNIPAQUE IOHEXOL 350 MG/ML SOLN COMPARISON:  CT angiography chest, abdomen and pelvis from 09/23/2022. FINDINGS: CTA CHEST FINDINGS Cardiovascular: Examination is markedly limited due to suboptimal contrast  enhancement. Evaluation of lobar, segmental and subsegmental pulmonary arteries is nondiagnostic on this exam. Evaluation of main pulmonary artery is also limited however, there is no large volume pulmonary embolism seen. Correlate clinically to determine the need for repeat examination. Normal cardiac size. No pericardial effusion. No aortic aneurysm. Mediastinum/Nodes: Visualized thyroid gland appears grossly unremarkable. No solid / cystic mediastinal masses. The esophagus is nondistended precluding optimal assessment. No axillary, mediastinal or hilar lymphadenopathy by size criteria. Lungs/Pleura: The central tracheo-bronchial tree is patent. There are dependent changes in bilateral lungs. There are patchy areas of linear, plate-like atelectasis and/or scarring throughout bilateral lungs. No mass or consolidation. No pleural effusion or pneumothorax. No suspicious lung nodules. Musculoskeletal: The visualized soft tissues of the chest wall are grossly unremarkable. No suspicious osseous lesions. Review of the MIP images confirms the above findings. CT ABDOMEN and PELVIS FINDINGS Hepatobiliary: The liver is normal in size. Non-cirrhotic configuration. No suspicious mass. These is mild diffuse hepatic steatosis. There is a stable subcentimeter hypoattenuating focus in the left hepatic dome, segment 4A, which is too small to adequately characterize but unchanged since several prior studies and favored benign in etiology. No intrahepatic or extrahepatic bile duct dilation. Gallbladder is surgically absent. Pancreas: Unremarkable. No pancreatic ductal dilatation or surrounding inflammatory changes. Spleen: Within normal limits. No focal lesion. Adrenals/Urinary Tract: Adrenal glands are unremarkable. No suspicious renal mass. No hydronephrosis. No renal or ureteric calculi. Unremarkable urinary bladder. Stomach/Bowel: No disproportionate dilation of the small or large bowel loops. No evidence of abnormal bowel wall  thickening or inflammatory changes. The appendix is surgically absent. There is moderate stool burden. There is fecalization of terminal ileum, which can be due to constipation or incompetent ileocecal valve. Vascular/Lymphatic: No ascites or pneumoperitoneum. No abdominal or pelvic lymphadenopathy, by size criteria. No aneurysmal dilation of the major abdominal arteries. Reproductive: The uterus is unremarkable. No  large adnexal mass. Other: Periumbilical surgical scar noted. The soft tissues and abdominal wall are otherwise unremarkable. Musculoskeletal: No suspicious osseous lesions. Review of the MIP images confirms the above findings. IMPRESSION: 1. Examination is markedly limited due to suboptimal contrast enhancement. Evaluation of lobar, segmental and subsegmental pulmonary arteries is nondiagnostic on this exam. Evaluation of main pulmonary artery is also limited however, there is no large volume pulmonary embolism seen. Correlate clinically to determine the need for repeat examination. 2. No acute findings in the abdomen or pelvis. 3. Moderate stool burden with fecalization of terminal ileum, which can be due to constipation or incompetent ileocecal valve. No bowel obstruction. 4. Multiple other nonacute observations, as described above. Electronically Signed   By: Jules Schick M.D.   On: 11/10/2022 12:21   CT ABDOMEN PELVIS W CONTRAST  Result Date: 11/10/2022 CLINICAL DATA:  Pulmonary embolism (PE) suspected, high prob; Abdominal pain, acute, nonlocalized. Chest pain and abdominal pain. Nausea and vomiting. EXAM: CT ANGIOGRAPHY CHEST CT ABDOMEN AND PELVIS WITH CONTRAST TECHNIQUE: Multidetector CT imaging of the chest was performed using the standard protocol during bolus administration of intravenous contrast. Multiplanar CT image reconstructions and MIPs were obtained to evaluate the vascular anatomy. Multidetector CT imaging of the abdomen and pelvis was performed using the standard protocol  during bolus administration of intravenous contrast. RADIATION DOSE REDUCTION: This exam was performed according to the departmental dose-optimization program which includes automated exposure control, adjustment of the mA and/or kV according to patient size and/or use of iterative reconstruction technique. CONTRAST:  OMNIPAQUE IOHEXOL 350 MG/ML SOLN COMPARISON:  CT angiography chest, abdomen and pelvis from 09/23/2022. FINDINGS: CTA CHEST FINDINGS Cardiovascular: Examination is markedly limited due to suboptimal contrast enhancement. Evaluation of lobar, segmental and subsegmental pulmonary arteries is nondiagnostic on this exam. Evaluation of main pulmonary artery is also limited however, there is no large volume pulmonary embolism seen. Correlate clinically to determine the need for repeat examination. Normal cardiac size. No pericardial effusion. No aortic aneurysm. Mediastinum/Nodes: Visualized thyroid gland appears grossly unremarkable. No solid / cystic mediastinal masses. The esophagus is nondistended precluding optimal assessment. No axillary, mediastinal or hilar lymphadenopathy by size criteria. Lungs/Pleura: The central tracheo-bronchial tree is patent. There are dependent changes in bilateral lungs. There are patchy areas of linear, plate-like atelectasis and/or scarring throughout bilateral lungs. No mass or consolidation. No pleural effusion or pneumothorax. No suspicious lung nodules. Musculoskeletal: The visualized soft tissues of the chest wall are grossly unremarkable. No suspicious osseous lesions. Review of the MIP images confirms the above findings. CT ABDOMEN and PELVIS FINDINGS Hepatobiliary: The liver is normal in size. Non-cirrhotic configuration. No suspicious mass. These is mild diffuse hepatic steatosis. There is a stable subcentimeter hypoattenuating focus in the left hepatic dome, segment 4A, which is too small to adequately characterize but unchanged since several prior studies  and favored benign in etiology. No intrahepatic or extrahepatic bile duct dilation. Gallbladder is surgically absent. Pancreas: Unremarkable. No pancreatic ductal dilatation or surrounding inflammatory changes. Spleen: Within normal limits. No focal lesion. Adrenals/Urinary Tract: Adrenal glands are unremarkable. No suspicious renal mass. No hydronephrosis. No renal or ureteric calculi. Unremarkable urinary bladder. Stomach/Bowel: No disproportionate dilation of the small or large bowel loops. No evidence of abnormal bowel wall thickening or inflammatory changes. The appendix is surgically absent. There is moderate stool burden. There is fecalization of terminal ileum, which can be due to constipation or incompetent ileocecal valve. Vascular/Lymphatic: No ascites or pneumoperitoneum. No abdominal or pelvic lymphadenopathy, by size  criteria. No aneurysmal dilation of the major abdominal arteries. Reproductive: The uterus is unremarkable. No large adnexal mass. Other: Periumbilical surgical scar noted. The soft tissues and abdominal wall are otherwise unremarkable. Musculoskeletal: No suspicious osseous lesions. Review of the MIP images confirms the above findings. IMPRESSION: 1. Examination is markedly limited due to suboptimal contrast enhancement. Evaluation of lobar, segmental and subsegmental pulmonary arteries is nondiagnostic on this exam. Evaluation of main pulmonary artery is also limited however, there is no large volume pulmonary embolism seen. Correlate clinically to determine the need for repeat examination. 2. No acute findings in the abdomen or pelvis. 3. Moderate stool burden with fecalization of terminal ileum, which can be due to constipation or incompetent ileocecal valve. No bowel obstruction. 4. Multiple other nonacute observations, as described above. Electronically Signed   By: Jules Schick M.D.   On: 11/10/2022 12:21   DG Chest 2 View  Result Date: 11/10/2022 CLINICAL DATA:  Chest pain.  EXAM: CHEST - 2 VIEW COMPARISON:  PA Lat 09/27/2022 FINDINGS: The heart size and mediastinal contours are within normal limits. Both lungs are clear. The visualized skeletal structures are unremarkable. IMPRESSION: No active cardiopulmonary disease.  Stable chest. Electronically Signed   By: Almira Bar M.D.   On: 11/10/2022 05:28    Pending Labs Unresulted Labs (From admission, onward)     Start     Ordered   11/11/22 0500  Comprehensive metabolic panel  Tomorrow morning,   R        11/10/22 1556   11/11/22 0500  CBC  Tomorrow morning,   R        11/10/22 1556   11/10/22 1558  Hemoglobin A1c  Once,   R        11/10/22 1558   11/10/22 1428  D-dimer, quantitative  Once,   STAT        11/10/22 1428   11/10/22 1428  Urine rapid drug screen (hosp performed)  Once,   STAT        11/10/22 1428   11/10/22 0727  Urinalysis, w/ Reflex to Culture (Infection Suspected) -  Once,   R        11/10/22 0727   Unscheduled  Occult blood card to lab, stool  As needed,   R      11/10/22 1436            Vitals/Pain Today's Vitals   11/10/22 0751 11/10/22 0840 11/10/22 0919 11/10/22 1006  BP:  (!) 137/105  (!) 160/116  Pulse:  (!) 108  (!) 115  Resp:  16  16  Temp:    98.5 F (36.9 C)  TempSrc:    Oral  SpO2:  95%  99%  Weight:      Height:      PainSc: 6   4      Isolation Precautions No active isolations  Medications Medications  apixaban (ELIQUIS) tablet 10 mg (has no administration in time range)    Followed by  apixaban (ELIQUIS) tablet 5 mg (has no administration in time range)  pantoprazole (PROTONIX) EC tablet 40 mg (40 mg Oral Given 11/10/22 1614)  docusate sodium (COLACE) capsule 100 mg (100 mg Oral Given 11/10/22 1614)  polyethylene glycol (MIRALAX / GLYCOLAX) packet 17 g (has no administration in time range)  hydroxychloroquine (PLAQUENIL) tablet 400 mg (has no administration in time range)  magnesium oxide (MAG-OX) tablet 400 mg (400 mg Oral Given 11/10/22 1613)   naloxegol oxalate (MOVANTIK) tablet 12.5 mg (  has no administration in time range)  oxyCODONE (Oxy IR/ROXICODONE) immediate release tablet 5 mg (has no administration in time range)  morphine (PF) 2 MG/ML injection 1 mg (has no administration in time range)  ondansetron (ZOFRAN) injection 4 mg (has no administration in time range)  ALPRAZolam (XANAX) tablet 1 mg (has no administration in time range)  sertraline (ZOLOFT) tablet 25 mg (has no administration in time range)  sodium chloride flush (NS) 0.9 % injection 3 mL (has no administration in time range)  acetaminophen (TYLENOL) tablet 650 mg (has no administration in time range)    Or  acetaminophen (TYLENOL) suppository 650 mg (has no administration in time range)  dextrose 5 % and 0.9 % NaCl with KCl 20 mEq/L infusion (has no administration in time range)  methylPREDNISolone sodium succinate (SOLU-MEDROL) 40 mg/mL injection 40 mg (40 mg Intravenous Given 11/10/22 0712)  diphenhydrAMINE (BENADRYL) capsule 50 mg ( Oral See Alternative 11/10/22 1006)    Or  diphenhydrAMINE (BENADRYL) injection 50 mg (50 mg Intravenous Given 11/10/22 1006)  HYDROmorphone (DILAUDID) injection 1 mg (1 mg Intravenous Given 11/10/22 0711)  diphenhydrAMINE (BENADRYL) injection 25 mg (25 mg Intravenous Given 11/10/22 0837)  HYDROmorphone (DILAUDID) injection 1 mg (1 mg Intravenous Given 11/10/22 0837)  ondansetron (ZOFRAN) injection 4 mg (4 mg Intravenous Given 11/10/22 0848)  iohexol (OMNIPAQUE) 350 MG/ML injection 100 mL (100 mLs Intravenous Contrast Given 11/10/22 1116)  ketamine 50 mg in normal saline 5 mL (10 mg/mL) syringe (33 mg Intravenous Given 11/10/22 1223)  iohexol (OMNIPAQUE) 350 MG/ML injection 75 mL (75 mLs Intravenous Contrast Given 11/10/22 1319)  magnesium hydroxide (MILK OF MAGNESIA) suspension 30 mL (30 mLs Oral Given 11/10/22 1612)  oxyCODONE (Oxy IR/ROXICODONE) immediate release tablet 5 mg (5 mg Oral Given 11/10/22 1639)  morphine (PF) 2  MG/ML injection 1 mg (1 mg Intravenous Given 11/10/22 1640)    Mobility walks

## 2022-11-10 NOTE — ED Provider Notes (Signed)
[place] EMERGENCY DEPARTMENT AT [facility] Provider Note  CSN: [id number] Arrival date & time: 11/10/22 4098  Chief Complaint(s) Abdominal Pain and Chest Pain  HPI [name] is a 27 y.o. female with a past medical history listed below including lupus, hypercoagulability with prior pulmonary emboli who is supposed to be on Eliquis but states that she is not taking it, endometriosis here for abdominal pain and chest pain ongoing for the past several days.  Abdominal pain feels like a flare for her endometriosis.  Endorsing associated nausea and nonbloody nonbilious emesis.  No diarrhea.  No urinary symptoms.   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   Collagen vascular disease (HCC)    Diarrhea    Patient mentions diagnosis of ulcerative colitis but it is not clear she actually has UC   Endometriosis    Hypertension 09/27/2022   Lupus (HCC)    Ovarian cyst    Panic disorder 05/30/2020   Pulmonary embolus (HCC) 02/06/2020   Seizures (HCC)    Patient Active Problem List   Diagnosis Date Noted   Microcytic anemia 09/28/2022   Iron deficiency 09/28/2022   Prolonged QT interval 09/27/2022   Hypocalcemia 09/27/2022   Hypertension 09/27/2022   Elevated troponin 09/27/2022   Generalized abdominal pain 09/27/2022   Chronic bilateral lower abdominal pain 09/27/2022   Abdominal pain, epigastric 09/24/2022   Nausea and vomiting 09/24/2022   History of gastritis 09/23/2022   Concern for Mallory-Weiss tear 09/23/2022   Intractable nausea and vomiting 09/23/2022   Metabolic acidosis 09/23/2022   Hematemesis with nausea 09/23/2022   GI bleeding 07/28/2022   Contusion of left hand 06/21/2022   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   Bipolar II disorder, most  recent episode major depressive (HCC) 08/25/2021   Altered mental status    Overdose 08/23/2021   Intentional acetaminophen overdose (HCC)    Xanax overdose    Tachycardia 08/05/2020   Anxiety    Hematemesis 06/17/2020   MDD (major depressive disorder), recurrent severe, 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   Panic disorder 05/30/2020   MDD (major depressive disorder) 05/29/2020   Suicide attempt by acetaminophen overdose (HCC) 05/29/2020   Obesity, Class III, BMI 40-49.9 (morbid obesity) (HCC) 04/05/2020   SOB (shortness of breath) 04/05/2020   Intractable abdominal pain 04/04/2020   SLE (systemic lupus erythematosus related syndrome) (HCC) 04/04/2020   Shortness of breath 04/04/2020   Pneumonia 03/23/2020   Nausea 03/23/2020   Asthma 03/22/2020   Dysuria 02/03/2020   Hematuria 02/03/2020   Mass of urinary bladder 01/26/2020   Allergic rhinitis 05/05/2019   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 Authorizing Provider  dicyclomine (BENTYL) 20 MG tablet Take 1 tablet (20 mg total) by mouth 2 (two) times daily. 11/29/18 04/11/19  Muthersbaugh, Dahlia Client, PA-C  Fluticasone Propionate HFA (FLOVENT HFA IN) Inhale into the lungs. Patient not taking: Reported on 11/14/2019  11/14/19  [provider]                                                                                                                                    Allergies Amoxicillin, Azithromycin, Fish-derived products, Peanut-containing drug products, Gluten meal, Iodinated contrast media, Other, Iodine, and  Lactose intolerance (gi)  Review of Systems Review of Systems  Cardiovascular:  Positive for chest pain.  Gastrointestinal:  Positive for abdominal pain.   As noted in HPI  Physical Exam Vital Signs  I have reviewed the triage vital signs BP (!) 144/116 (BP Location: Right Arm)   Pulse (!) 113   Temp 98.5 F (36.9 C) (Oral)   Resp 17   Ht 5\' 2"  (1.575 m)   Wt 108.9 kg   LMP  (LMP Unknown)   SpO2 98%   BMI 43.90 kg/m   Physical Exam Vitals reviewed.  Constitutional:      General: She is not in acute distress.    Appearance: She is well-developed. She is not diaphoretic.  HENT:     Head: Normocephalic and atraumatic.     Nose: Nose normal.  Eyes:     General: No scleral icterus.       Right eye: No discharge.        Left eye: No discharge.     Conjunctiva/sclera: Conjunctivae normal.     Pupils: Pupils are equal, round, and reactive to light.  Cardiovascular:     Rate and Rhythm: Normal rate and regular rhythm.     Heart sounds: No murmur heard.    No friction rub. No gallop.  Pulmonary:     Effort: Pulmonary effort is normal. No respiratory distress.     Breath sounds: Normal breath sounds. No stridor. No rales.  Abdominal:     General: There is no distension.     Palpations: Abdomen is soft.     Tenderness: There is abdominal tenderness in the right lower quadrant, suprapubic area and left lower quadrant.  Musculoskeletal:        General: No tenderness.     Cervical back: Normal range of motion and neck supple.  Skin:    General: Skin is warm and dry.     Findings: No erythema or rash.  Neurological:     Mental Status: She is alert and oriented to person, place, and time.     ED Results and Treatments Labs (all labs ordered are listed, but only abnormal results are displayed) Labs Reviewed  BASIC METABOLIC PANEL - Abnormal; Notable for the following components:      Result Value   Glucose, Bld 107 (*)    All other components within normal limits   CBC - Abnormal; Notable for the following components:   Hemoglobin 11.7 (*)    MCV 75.0 (*)    MCH 24.0 (*)    RDW 17.3 (*)    Platelets 403 (*)  All other components within normal limits  HCG, SERUM, QUALITATIVE  LIPASE, BLOOD  HEPATIC FUNCTION PANEL  URINALYSIS, W/ REFLEX TO CULTURE (INFECTION SUSPECTED)  TROPONIN I (HIGH SENSITIVITY)  TROPONIN I (HIGH SENSITIVITY)                                                                                                                         EKG  EKG Interpretation Date/Time:  Saturday November 10 2022 04:43:58 EDT Ventricular Rate:  129 PR Interval:  122 QRS Duration:  89 QT Interval:  428 QTC Calculation: 628 R Axis:   175  Text Interpretation: Sinus tachycardia Right axis deviation Low voltage, precordial leads Prolonged QT interval Confirmed by Drema Pry [id number]) on 11/10/2022 6:08:34 AM       Radiology DG Chest 2 View  Result Date: 11/10/2022 CLINICAL DATA:  Chest pain. EXAM: CHEST - 2 VIEW COMPARISON:  PA Lat 09/27/2022 FINDINGS: The heart size and mediastinal contours are within normal limits. Both lungs are clear. The visualized skeletal structures are unremarkable. IMPRESSION: No active cardiopulmonary disease.  Stable chest. Electronically Signed   By: Almira Bar M.D.   On: 11/10/2022 05:28    Medications Ordered in ED Medications  diphenhydrAMINE (BENADRYL) capsule 50 mg (has no administration in time range)    Or  diphenhydrAMINE (BENADRYL) injection 50 mg (has no administration in time range)  methylPREDNISolone sodium succinate (SOLU-MEDROL) 40 mg/mL injection 40 mg (40 mg Intravenous Given 11/10/22 0712)  HYDROmorphone (DILAUDID) injection 1 mg (1 mg Intravenous Given 11/10/22 6045)   Procedures Procedures  (including critical care time) Medical Decision Making / ED Course   Medical Decision Making Amount and/or Complexity of Data Reviewed Labs: ordered. Decision-making details documented in ED  Course. Radiology: ordered and independent interpretation performed. Decision-making details documented in ED Course. ECG/medicine tests: ordered and independent interpretation performed. Decision-making details documented in ED Course.    Abdominal pain  Will need to rule out pregnancy related process, urinary tract infection.  Will assess for any intra-abdominal inflammatory/infectious process. CBC without leukocytosis.  Mild anemia. Metabolic panel without significant electrolyte derangements or renal sufficiency.  No evidence of acute pancreatitis.  Chest pain EKG with sinus tachycardia.  No dysrhythmias or blocks.  No evidence of pericarditis. Troponin negative.  Doubt ACS. Chest x-ray evidence of pneumonia, pneumothorax, pulmonary edema or pleural effusions. Doubt acute aortic process. Will need to rule out PE.  Rest of labs and imaging pending.  Patient care turned over to oncoming provider. Patient case and results discussed in detail; please see their note for further ED managment.       Final Clinical Impression(s) / ED Diagnoses Final diagnoses:  None    This chart was dictated using voice recognition software.  Despite best efforts to proofread,  errors can occur which can change the documentation meaning.    Nira Conn, MD 11/10/22 [phone]

## 2022-11-10 NOTE — ED Triage Notes (Signed)
Patient here from home reporting abd pain and chest pain that started today with n/v.

## 2022-11-10 NOTE — ED Notes (Signed)
Lab adding Hepatic panel to previous labs collected

## 2022-11-10 NOTE — ED Provider Notes (Signed)
Care transferred to me.  Patient checked out waiting on CTs of chest as well as abdomen and pelvis.  She was given pain control in the emergency department.  Unfortunately despite multiple rounds of Dilaudid and then ketamine she is still having uncontrolled pain and reporting some vomiting.  Her labs are overall unremarkable compared to baseline.  CT abdomen and pelvis is unremarkable compared to baseline but her CTA of her chest was such poor quality from a timing perspective that no diagnosis of PE or rule out of PE could be made.  Will put her back on her Eliquis and try and get another scan but now there is trouble with IV access.  She is a little tachycardic though this may just be pain related.  I think she will need admission, at least observation.  Discussed with Dr. Lisabeth Register, he asked for a UDS and blood cultures and will also start her on her Eliquis.   Pricilla Loveless, MD 11/10/22 1430

## 2022-11-11 ENCOUNTER — Encounter (HOSPITAL_COMMUNITY): Payer: Self-pay | Admitting: Internal Medicine

## 2022-11-11 ENCOUNTER — Other Ambulatory Visit: Payer: Self-pay

## 2022-11-11 ENCOUNTER — Observation Stay (HOSPITAL_BASED_OUTPATIENT_CLINIC_OR_DEPARTMENT_OTHER): Payer: BC Managed Care – PPO

## 2022-11-11 ENCOUNTER — Observation Stay (HOSPITAL_COMMUNITY): Payer: BC Managed Care – PPO

## 2022-11-11 DIAGNOSIS — K900 Celiac disease: Secondary | ICD-10-CM | POA: Diagnosis present

## 2022-11-11 DIAGNOSIS — R109 Unspecified abdominal pain: Secondary | ICD-10-CM

## 2022-11-11 DIAGNOSIS — E66813 Obesity, class 3: Secondary | ICD-10-CM | POA: Diagnosis present

## 2022-11-11 DIAGNOSIS — R000 Tachycardia, unspecified: Secondary | ICD-10-CM

## 2022-11-11 DIAGNOSIS — R569 Unspecified convulsions: Secondary | ICD-10-CM | POA: Diagnosis present

## 2022-11-11 DIAGNOSIS — Z880 Allergy status to penicillin: Secondary | ICD-10-CM | POA: Diagnosis not present

## 2022-11-11 DIAGNOSIS — Z91041 Radiographic dye allergy status: Secondary | ICD-10-CM | POA: Diagnosis not present

## 2022-11-11 DIAGNOSIS — K5900 Constipation, unspecified: Secondary | ICD-10-CM | POA: Diagnosis not present

## 2022-11-11 DIAGNOSIS — J45909 Unspecified asthma, uncomplicated: Secondary | ICD-10-CM | POA: Diagnosis present

## 2022-11-11 DIAGNOSIS — R112 Nausea with vomiting, unspecified: Secondary | ICD-10-CM

## 2022-11-11 DIAGNOSIS — Z86711 Personal history of pulmonary embolism: Secondary | ICD-10-CM | POA: Diagnosis not present

## 2022-11-11 DIAGNOSIS — E869 Volume depletion, unspecified: Secondary | ICD-10-CM | POA: Diagnosis not present

## 2022-11-11 DIAGNOSIS — D509 Iron deficiency anemia, unspecified: Secondary | ICD-10-CM | POA: Diagnosis present

## 2022-11-11 DIAGNOSIS — N809 Endometriosis, unspecified: Secondary | ICD-10-CM | POA: Diagnosis present

## 2022-11-11 DIAGNOSIS — F6089 Other specific personality disorders: Secondary | ICD-10-CM | POA: Diagnosis present

## 2022-11-11 DIAGNOSIS — M329 Systemic lupus erythematosus, unspecified: Secondary | ICD-10-CM | POA: Diagnosis present

## 2022-11-11 DIAGNOSIS — R0602 Shortness of breath: Secondary | ICD-10-CM | POA: Diagnosis not present

## 2022-11-11 DIAGNOSIS — Z6841 Body Mass Index (BMI) 40.0 and over, adult: Secondary | ICD-10-CM | POA: Diagnosis not present

## 2022-11-11 DIAGNOSIS — R7303 Prediabetes: Secondary | ICD-10-CM | POA: Diagnosis present

## 2022-11-11 DIAGNOSIS — G8929 Other chronic pain: Secondary | ICD-10-CM | POA: Diagnosis present

## 2022-11-11 DIAGNOSIS — D75839 Thrombocytosis, unspecified: Secondary | ICD-10-CM | POA: Diagnosis present

## 2022-11-11 DIAGNOSIS — I10 Essential (primary) hypertension: Secondary | ICD-10-CM | POA: Diagnosis present

## 2022-11-11 DIAGNOSIS — F319 Bipolar disorder, unspecified: Secondary | ICD-10-CM | POA: Diagnosis present

## 2022-11-11 DIAGNOSIS — K219 Gastro-esophageal reflux disease without esophagitis: Secondary | ICD-10-CM | POA: Diagnosis present

## 2022-11-11 DIAGNOSIS — R1084 Generalized abdominal pain: Secondary | ICD-10-CM | POA: Diagnosis not present

## 2022-11-11 DIAGNOSIS — E860 Dehydration: Secondary | ICD-10-CM | POA: Diagnosis present

## 2022-11-11 DIAGNOSIS — F410 Panic disorder [episodic paroxysmal anxiety] without agoraphobia: Secondary | ICD-10-CM | POA: Diagnosis present

## 2022-11-11 DIAGNOSIS — R079 Chest pain, unspecified: Secondary | ICD-10-CM | POA: Diagnosis present

## 2022-11-11 DIAGNOSIS — Z91010 Allergy to peanuts: Secondary | ICD-10-CM | POA: Diagnosis not present

## 2022-11-11 DIAGNOSIS — Z888 Allergy status to other drugs, medicaments and biological substances status: Secondary | ICD-10-CM | POA: Diagnosis not present

## 2022-11-11 DIAGNOSIS — Z8041 Family history of malignant neoplasm of ovary: Secondary | ICD-10-CM | POA: Diagnosis not present

## 2022-11-11 LAB — URINALYSIS, W/ REFLEX TO CULTURE (INFECTION SUSPECTED)
Bacteria, UA: NONE SEEN
Bilirubin Urine: NEGATIVE
Glucose, UA: NEGATIVE mg/dL
Hgb urine dipstick: NEGATIVE
Ketones, ur: NEGATIVE mg/dL
Leukocytes,Ua: NEGATIVE
Nitrite: NEGATIVE
Protein, ur: NEGATIVE mg/dL
Specific Gravity, Urine: 1.027 (ref 1.005–1.030)
pH: 5 (ref 5.0–8.0)

## 2022-11-11 LAB — COMPREHENSIVE METABOLIC PANEL
ALT: 14 U/L (ref 0–44)
AST: 19 U/L (ref 15–41)
Albumin: 3.9 g/dL (ref 3.5–5.0)
Alkaline Phosphatase: 70 U/L (ref 38–126)
Anion gap: 9 (ref 5–15)
BUN: 6 mg/dL (ref 6–20)
CO2: 22 mmol/L (ref 22–32)
Calcium: 9.5 mg/dL (ref 8.9–10.3)
Chloride: 108 mmol/L (ref 98–111)
Creatinine, Ser: 0.7 mg/dL (ref 0.44–1.00)
GFR, Estimated: >60 mL/min (ref >60)
Glucose, Bld: 142 mg/dL — ABNORMAL HIGH (ref 70–99)
Potassium: 4.3 mmol/L (ref 3.5–5.1)
Sodium: 139 mmol/L (ref 135–145)
Total Bilirubin: 0.5 mg/dL (ref 0.3–1.2)
Total Protein: 7.7 g/dL (ref 6.5–8.1)

## 2022-11-11 LAB — CBC
HCT: 37.4 % (ref 36.0–46.0)
Hemoglobin: 12 g/dL (ref 12.0–15.0)
MCH: 23.8 pg — ABNORMAL LOW (ref 26.0–34.0)
MCHC: 32.1 g/dL (ref 30.0–36.0)
MCV: 74.1 fL — ABNORMAL LOW (ref 80.0–100.0)
Platelets: 420 10*3/uL — ABNORMAL HIGH (ref 150–400)
RBC: 5.05 MIL/uL (ref 3.87–5.11)
RDW: 17.2 % — ABNORMAL HIGH (ref 11.5–15.5)
WBC: 10 10*3/uL (ref 4.0–10.5)
nRBC: 0 % (ref 0.0–0.2)

## 2022-11-11 LAB — RAPID URINE DRUG SCREEN, HOSP PERFORMED
Amphetamines: NOT DETECTED
Barbiturates: NOT DETECTED
Benzodiazepines: POSITIVE — AB
Cocaine: NOT DETECTED
Opiates: POSITIVE — AB
Tetrahydrocannabinol: NOT DETECTED

## 2022-11-11 LAB — MAGNESIUM: Magnesium: 2.1 mg/dL (ref 1.7–2.4)

## 2022-11-11 LAB — PHOSPHORUS: Phosphorus: 2.7 mg/dL (ref 2.5–4.6)

## 2022-11-11 MED ORDER — HYDROMORPHONE HCL 1 MG/ML IJ SOLN
0.5000 mg | INTRAMUSCULAR | Status: DC | PRN
  Administered 2022-11-11 – 2022-11-14 (×23): 0.5 mg via INTRAVENOUS
  Filled 2022-11-11 (×23): qty 0.5

## 2022-11-11 MED ORDER — POLYETHYLENE GLYCOL 3350 17 G PO PACK
17.0000 g | PACK | Freq: Two times a day (BID) | ORAL | Status: DC
  Administered 2022-11-11 – 2022-11-16 (×8): 17 g via ORAL
  Filled 2022-11-11 (×9): qty 1

## 2022-11-11 MED ORDER — NORETHINDRONE ACETATE 5 MG PO TABS
10.0000 mg | ORAL_TABLET | Freq: Every evening | ORAL | Status: DC
  Administered 2022-11-11 – 2022-11-15 (×5): 10 mg via ORAL
  Filled 2022-11-11 (×6): qty 2

## 2022-11-11 MED ORDER — HYDROMORPHONE HCL 1 MG/ML IJ SOLN
0.5000 mg | Freq: Once | INTRAMUSCULAR | Status: AC
  Administered 2022-11-11: 0.5 mg via INTRAVENOUS
  Filled 2022-11-11: qty 0.5

## 2022-11-11 MED ORDER — IOHEXOL 350 MG/ML SOLN
75.0000 mL | Freq: Once | INTRAVENOUS | Status: AC | PRN
  Administered 2022-11-11: 75 mL via INTRAVENOUS

## 2022-11-11 MED ORDER — SENNOSIDES-DOCUSATE SODIUM 8.6-50 MG PO TABS
1.0000 | ORAL_TABLET | Freq: Two times a day (BID) | ORAL | Status: DC
  Administered 2022-11-11 – 2022-11-16 (×10): 1 via ORAL
  Filled 2022-11-11 (×10): qty 1

## 2022-11-11 MED ORDER — HYDRALAZINE HCL 20 MG/ML IJ SOLN
10.0000 mg | Freq: Four times a day (QID) | INTRAMUSCULAR | Status: DC | PRN
Start: 2022-11-11 — End: 2022-11-16
  Administered 2022-11-11 – 2022-11-15 (×5): 10 mg via INTRAVENOUS
  Filled 2022-11-11 (×6): qty 1

## 2022-11-11 MED ORDER — DIPHENHYDRAMINE HCL 50 MG/ML IJ SOLN
25.0000 mg | Freq: Four times a day (QID) | INTRAMUSCULAR | Status: DC | PRN
  Administered 2022-11-11 – 2022-11-12 (×4): 25 mg via INTRAVENOUS
  Filled 2022-11-11 (×4): qty 1

## 2022-11-11 MED ORDER — ENOXAPARIN SODIUM 40 MG/0.4ML IJ SOSY
40.0000 mg | PREFILLED_SYRINGE | INTRAMUSCULAR | Status: DC
  Administered 2022-11-11 – 2022-11-15 (×5): 40 mg via SUBCUTANEOUS
  Filled 2022-11-11 (×5): qty 0.4

## 2022-11-11 MED ORDER — SODIUM CHLORIDE 0.9 % IV BOLUS
1000.0000 mL | Freq: Once | INTRAVENOUS | Status: AC
  Administered 2022-11-11: 1000 mL via INTRAVENOUS

## 2022-11-11 NOTE — Plan of Care (Signed)
  Problem: Coping: Goal: Level of anxiety will decrease Outcome: Not Progressing   Problem: Elimination: Goal: Will not experience complications related to bowel motility Outcome: Not Progressing   Problem: Pain Managment: Goal: General experience of comfort will improve Outcome: Not Progressing   

## 2022-11-11 NOTE — Progress Notes (Signed)
Weight: 108.9 kg   Examination: Physical Exam:  Constitutional: WN/WD morbidly obese female who is extremely anxious Respiratory: Diminished to auscultation bilaterally, no wheezing, rales, rhonchi or crackles. Normal respiratory effort and patient is not tachypenic. No accessory muscle use.  Unlabored breathing Cardiovascular: Tachycardic rate and regular rhythm, no murmurs / rubs / gallops. S1 and S2 auscultated. No extremity edema. Abdomen: Soft, tender to palpate, distended secondary body habitus. Bowel sounds positive.  GU: Deferred. Musculoskeletal: No clubbing / cyanosis of digits/nails. No joint deformity upper and lower extremities. Skin: No rashes, lesions, ulcers on limited skin evaluation. No induration; Warm and dry.  Neurologic: CN 2-12 grossly intact with no focal deficits. Romberg sign and cerebellar reflexes not assessed.  Psychiatric: Normal judgment and insight. Alert and oriented x 3. Normal mood and appropriate affect.   Data Reviewed: I have personally reviewed following labs and  imaging studies  CBC: Recent Labs  Lab 11/10/22 0442 11/11/22 0519  WBC 7.2 10.0  HGB 11.7* 12.0  HCT 36.6 37.4  MCV 75.0* 74.1*  PLT 403* 420*   Basic Metabolic Panel: Recent Labs  Lab 11/10/22 0442 11/11/22 0519  NA 141 139  K 3.5 4.3  CL 109 108  CO2 22 22  GLUCOSE 107* 142*  BUN 8 6  CREATININE 0.82 0.70  CALCIUM 9.4 9.5  MG  --  2.1  PHOS  --  2.7   GFR: Estimated Creatinine Clearance: 120.6 mL/min (by C-G formula based on SCr of 0.7 mg/dL). Liver Function Tests: Recent Labs  Lab 11/10/22 0455 11/11/22 0519  AST 14* 19  ALT 12 14  ALKPHOS 73 70  BILITOT 0.5 0.5  PROT 7.2 7.7  ALBUMIN 4.1 3.9   Recent Labs  Lab 11/10/22 0442  LIPASE 30   No results for input(s): "AMMONIA" in the last 168 hours. Coagulation Profile: No results for input(s): "INR", "PROTIME" in the last 168 hours. Cardiac Enzymes: No results for input(s): "CKTOTAL", "CKMB", "CKMBINDEX", "TROPONINI" in the last 168 hours. BNP (last 3 results) No results for input(s): "PROBNP" in the last 8760 hours. HbA1C: No results for input(s): "HGBA1C" in the last 72 hours. CBG: No results for input(s): "GLUCAP" in the last 168 hours. Lipid Profile: No results for input(s): "CHOL", "HDL", "LDLCALC", "TRIG", "CHOLHDL", "LDLDIRECT" in the last 72 hours. Thyroid Function Tests: No results for input(s): "TSH", "T4TOTAL", "FREET4", "T3FREE", "THYROIDAB" in the last 72 hours. Anemia Panel: No results for input(s): "VITAMINB12", "FOLATE", "FERRITIN", "TIBC", "IRON", "RETICCTPCT" in the last 72 hours. Sepsis Labs: No results for input(s): "PROCALCITON", "LATICACIDVEN" in the last 168 hours.  No results found for this or any previous visit (from the past 240 hour(s)).   Radiology Studies: VAS [place] LOWER EXTREMITY VENOUS (DVT)  Result Date: 11/11/2022  Lower Venous DVT Study Patient Name:  [name]  Date of Exam:   11/11/2022 Medical Rec #: [id number]          Accession #:    [id number] Date  of Birth: 1995/06/06          Patient Gender: F Patient Age:   27 years Exam Location:  [facility] Procedure:      VAS [place] LOWER EXTREMITY VENOUS (DVT) Referring Phys: Marguerita Merles --------------------------------------------------------------------------------  Indications: Pain.  Risk Factors: Recent extended travel DVT Hx obesity. Anticoagulation: Eliquis. Comparison Study: No prior study Performing Technologist: [name]  Examination Guidelines: A complete evaluation includes B-mode imaging, spectral Doppler, color Doppler, and power Doppler as needed of all accessible portions  Weight: 108.9 kg   Examination: Physical Exam:  Constitutional: WN/WD morbidly obese female who is extremely anxious Respiratory: Diminished to auscultation bilaterally, no wheezing, rales, rhonchi or crackles. Normal respiratory effort and patient is not tachypenic. No accessory muscle use.  Unlabored breathing Cardiovascular: Tachycardic rate and regular rhythm, no murmurs / rubs / gallops. S1 and S2 auscultated. No extremity edema. Abdomen: Soft, tender to palpate, distended secondary body habitus. Bowel sounds positive.  GU: Deferred. Musculoskeletal: No clubbing / cyanosis of digits/nails. No joint deformity upper and lower extremities. Skin: No rashes, lesions, ulcers on limited skin evaluation. No induration; Warm and dry.  Neurologic: CN 2-12 grossly intact with no focal deficits. Romberg sign and cerebellar reflexes not assessed.  Psychiatric: Normal judgment and insight. Alert and oriented x 3. Normal mood and appropriate affect.   Data Reviewed: I have personally reviewed following labs and  imaging studies  CBC: Recent Labs  Lab 11/10/22 0442 11/11/22 0519  WBC 7.2 10.0  HGB 11.7* 12.0  HCT 36.6 37.4  MCV 75.0* 74.1*  PLT 403* 420*   Basic Metabolic Panel: Recent Labs  Lab 11/10/22 0442 11/11/22 0519  NA 141 139  K 3.5 4.3  CL 109 108  CO2 22 22  GLUCOSE 107* 142*  BUN 8 6  CREATININE 0.82 0.70  CALCIUM 9.4 9.5  MG  --  2.1  PHOS  --  2.7   GFR: Estimated Creatinine Clearance: 120.6 mL/min (by C-G formula based on SCr of 0.7 mg/dL). Liver Function Tests: Recent Labs  Lab 11/10/22 0455 11/11/22 0519  AST 14* 19  ALT 12 14  ALKPHOS 73 70  BILITOT 0.5 0.5  PROT 7.2 7.7  ALBUMIN 4.1 3.9   Recent Labs  Lab 11/10/22 0442  LIPASE 30   No results for input(s): "AMMONIA" in the last 168 hours. Coagulation Profile: No results for input(s): "INR", "PROTIME" in the last 168 hours. Cardiac Enzymes: No results for input(s): "CKTOTAL", "CKMB", "CKMBINDEX", "TROPONINI" in the last 168 hours. BNP (last 3 results) No results for input(s): "PROBNP" in the last 8760 hours. HbA1C: No results for input(s): "HGBA1C" in the last 72 hours. CBG: No results for input(s): "GLUCAP" in the last 168 hours. Lipid Profile: No results for input(s): "CHOL", "HDL", "LDLCALC", "TRIG", "CHOLHDL", "LDLDIRECT" in the last 72 hours. Thyroid Function Tests: No results for input(s): "TSH", "T4TOTAL", "FREET4", "T3FREE", "THYROIDAB" in the last 72 hours. Anemia Panel: No results for input(s): "VITAMINB12", "FOLATE", "FERRITIN", "TIBC", "IRON", "RETICCTPCT" in the last 72 hours. Sepsis Labs: No results for input(s): "PROCALCITON", "LATICACIDVEN" in the last 168 hours.  No results found for this or any previous visit (from the past 240 hour(s)).   Radiology Studies: VAS [place] LOWER EXTREMITY VENOUS (DVT)  Result Date: 11/11/2022  Lower Venous DVT Study Patient Name:  [name]  Date of Exam:   11/11/2022 Medical Rec #: [id number]          Accession #:    [id number] Date  of Birth: 1995/06/06          Patient Gender: F Patient Age:   27 years Exam Location:  [facility] Procedure:      VAS [place] LOWER EXTREMITY VENOUS (DVT) Referring Phys: Marguerita Merles --------------------------------------------------------------------------------  Indications: Pain.  Risk Factors: Recent extended travel DVT Hx obesity. Anticoagulation: Eliquis. Comparison Study: No prior study Performing Technologist: [name]  Examination Guidelines: A complete evaluation includes B-mode imaging, spectral Doppler, color Doppler, and power Doppler as needed of all accessible portions  PROGRESS NOTE    [name]  BMW:[id number] DOB: Mar 19, 1995 DOA: 11/10/2022 PCP: Oneita Hurt, No   Brief Narrative:  HPI per Junious Silk Note:   [name] is a 27 y.o. female with medical history significant of asthma, prediabetes with hemoglobin A1c 6.3 in 2022, celiac disease, cluster B personality disorder, panic disorder, collagen vascular disease, diarrhea, endometriosis with significant bowel involvement, SLE, ovarian cyst, PE January 2022, reported seizures, class III obesity with a BMI in the 40s.  Patient has had several admissions in the past 2 to 3 years at this facility due to abdominal pain with intractable nausea and vomiting and at times has had hematemesis.  She was last evaluated at our facility and left AMA on 09/29/2022.  3 days prior to that admission she had undergone an EGD with no significant findings noted.  Also during this previous admission GI was consulted.  It was suspected that given previous negative EGDs that her intermittent hematemesis had come from a possible Mallory-Weiss tear or esophagitis from repeated nausea and vomiting.  She had apparently not picked up her PPI prior to that most recent admission.  She has also had issues related to her endometriosis causing severe intractable constipation.  She was started on Linzess with consideration of transitioning to Movantik if intolerant.  She was also found to have chronic iron deficiency anemia with an iron level of 25.  Recommendation was for the patient to follow-up with GI after discharge but again she left AMA.   Patient presented to the ED today with report of abdominal pain and chest pain that started less than 24 hours prior to presentation.  She reports recurrent nausea and repeated emesis none witnessed since arrival.  She has subsequently undergone a CT of the abdomen which shows no acute finding but does show moderate stool burden.  Because of her chest pain and history of PE a CTA of the chest was  attempted but images were poor and recommendation was to attempt another CTA prior to discharge.  Patient has essentially been noncompliant with her anticoagulation since initially diagnosed.  She reported that during her previous hospitalization there was some discussion regarding possible discontinuation of anticoagulation.  Because she left AMA she opted not to take the medication.     Upon my evaluation of the patient she is continuing to for abdominal pain 10/10 and reports a recent episode of emesis prior to my entry into the room.  She is bright and cheerful.  She is very talkative and has discussed her multiple medical problems.  Her primary focus is on how her endometriosis is impacting her daily life in regards to pain and issues related to chronic constipation.  She states her lupus is well-controlled on her Plaquenil and she does have a rheumatologist in [place].  She states she attends online classes due to her chronic pain.  She reports that the best she felt with her endometriosis was when she was on Zoladex every 3 months.  Unfortunately she has not followed up with any GYN providers in regards to management of this medication.  The ER has asked the hospitalist to evaluate the patient for admission.  Given her presentation and still unclarified issues regarding possible PE she will be placed in a progressive care bed.  **Interim History  She continues to have some tachycardia and is extremely anxious.  Likely PE scan was repeated and showed no evidence of central PE.  Lower extremity venous duplex negative.  D-dimer was  PROGRESS NOTE    [name]  BMW:[id number] DOB: Mar 19, 1995 DOA: 11/10/2022 PCP: Oneita Hurt, No   Brief Narrative:  HPI per Junious Silk Note:   [name] is a 27 y.o. female with medical history significant of asthma, prediabetes with hemoglobin A1c 6.3 in 2022, celiac disease, cluster B personality disorder, panic disorder, collagen vascular disease, diarrhea, endometriosis with significant bowel involvement, SLE, ovarian cyst, PE January 2022, reported seizures, class III obesity with a BMI in the 40s.  Patient has had several admissions in the past 2 to 3 years at this facility due to abdominal pain with intractable nausea and vomiting and at times has had hematemesis.  She was last evaluated at our facility and left AMA on 09/29/2022.  3 days prior to that admission she had undergone an EGD with no significant findings noted.  Also during this previous admission GI was consulted.  It was suspected that given previous negative EGDs that her intermittent hematemesis had come from a possible Mallory-Weiss tear or esophagitis from repeated nausea and vomiting.  She had apparently not picked up her PPI prior to that most recent admission.  She has also had issues related to her endometriosis causing severe intractable constipation.  She was started on Linzess with consideration of transitioning to Movantik if intolerant.  She was also found to have chronic iron deficiency anemia with an iron level of 25.  Recommendation was for the patient to follow-up with GI after discharge but again she left AMA.   Patient presented to the ED today with report of abdominal pain and chest pain that started less than 24 hours prior to presentation.  She reports recurrent nausea and repeated emesis none witnessed since arrival.  She has subsequently undergone a CT of the abdomen which shows no acute finding but does show moderate stool burden.  Because of her chest pain and history of PE a CTA of the chest was  attempted but images were poor and recommendation was to attempt another CTA prior to discharge.  Patient has essentially been noncompliant with her anticoagulation since initially diagnosed.  She reported that during her previous hospitalization there was some discussion regarding possible discontinuation of anticoagulation.  Because she left AMA she opted not to take the medication.     Upon my evaluation of the patient she is continuing to for abdominal pain 10/10 and reports a recent episode of emesis prior to my entry into the room.  She is bright and cheerful.  She is very talkative and has discussed her multiple medical problems.  Her primary focus is on how her endometriosis is impacting her daily life in regards to pain and issues related to chronic constipation.  She states her lupus is well-controlled on her Plaquenil and she does have a rheumatologist in [place].  She states she attends online classes due to her chronic pain.  She reports that the best she felt with her endometriosis was when she was on Zoladex every 3 months.  Unfortunately she has not followed up with any GYN providers in regards to management of this medication.  The ER has asked the hospitalist to evaluate the patient for admission.  Given her presentation and still unclarified issues regarding possible PE she will be placed in a progressive care bed.  **Interim History  She continues to have some tachycardia and is extremely anxious.  Likely PE scan was repeated and showed no evidence of central PE.  Lower extremity venous duplex negative.  D-dimer was  PROGRESS NOTE    [name]  BMW:[id number] DOB: Mar 19, 1995 DOA: 11/10/2022 PCP: Oneita Hurt, No   Brief Narrative:  HPI per Junious Silk Note:   [name] is a 27 y.o. female with medical history significant of asthma, prediabetes with hemoglobin A1c 6.3 in 2022, celiac disease, cluster B personality disorder, panic disorder, collagen vascular disease, diarrhea, endometriosis with significant bowel involvement, SLE, ovarian cyst, PE January 2022, reported seizures, class III obesity with a BMI in the 40s.  Patient has had several admissions in the past 2 to 3 years at this facility due to abdominal pain with intractable nausea and vomiting and at times has had hematemesis.  She was last evaluated at our facility and left AMA on 09/29/2022.  3 days prior to that admission she had undergone an EGD with no significant findings noted.  Also during this previous admission GI was consulted.  It was suspected that given previous negative EGDs that her intermittent hematemesis had come from a possible Mallory-Weiss tear or esophagitis from repeated nausea and vomiting.  She had apparently not picked up her PPI prior to that most recent admission.  She has also had issues related to her endometriosis causing severe intractable constipation.  She was started on Linzess with consideration of transitioning to Movantik if intolerant.  She was also found to have chronic iron deficiency anemia with an iron level of 25.  Recommendation was for the patient to follow-up with GI after discharge but again she left AMA.   Patient presented to the ED today with report of abdominal pain and chest pain that started less than 24 hours prior to presentation.  She reports recurrent nausea and repeated emesis none witnessed since arrival.  She has subsequently undergone a CT of the abdomen which shows no acute finding but does show moderate stool burden.  Because of her chest pain and history of PE a CTA of the chest was  attempted but images were poor and recommendation was to attempt another CTA prior to discharge.  Patient has essentially been noncompliant with her anticoagulation since initially diagnosed.  She reported that during her previous hospitalization there was some discussion regarding possible discontinuation of anticoagulation.  Because she left AMA she opted not to take the medication.     Upon my evaluation of the patient she is continuing to for abdominal pain 10/10 and reports a recent episode of emesis prior to my entry into the room.  She is bright and cheerful.  She is very talkative and has discussed her multiple medical problems.  Her primary focus is on how her endometriosis is impacting her daily life in regards to pain and issues related to chronic constipation.  She states her lupus is well-controlled on her Plaquenil and she does have a rheumatologist in [place].  She states she attends online classes due to her chronic pain.  She reports that the best she felt with her endometriosis was when she was on Zoladex every 3 months.  Unfortunately she has not followed up with any GYN providers in regards to management of this medication.  The ER has asked the hospitalist to evaluate the patient for admission.  Given her presentation and still unclarified issues regarding possible PE she will be placed in a progressive care bed.  **Interim History  She continues to have some tachycardia and is extremely anxious.  Likely PE scan was repeated and showed no evidence of central PE.  Lower extremity venous duplex negative.  D-dimer was  PROGRESS NOTE    [name]  BMW:[id number] DOB: Mar 19, 1995 DOA: 11/10/2022 PCP: Oneita Hurt, No   Brief Narrative:  HPI per Junious Silk Note:   [name] is a 27 y.o. female with medical history significant of asthma, prediabetes with hemoglobin A1c 6.3 in 2022, celiac disease, cluster B personality disorder, panic disorder, collagen vascular disease, diarrhea, endometriosis with significant bowel involvement, SLE, ovarian cyst, PE January 2022, reported seizures, class III obesity with a BMI in the 40s.  Patient has had several admissions in the past 2 to 3 years at this facility due to abdominal pain with intractable nausea and vomiting and at times has had hematemesis.  She was last evaluated at our facility and left AMA on 09/29/2022.  3 days prior to that admission she had undergone an EGD with no significant findings noted.  Also during this previous admission GI was consulted.  It was suspected that given previous negative EGDs that her intermittent hematemesis had come from a possible Mallory-Weiss tear or esophagitis from repeated nausea and vomiting.  She had apparently not picked up her PPI prior to that most recent admission.  She has also had issues related to her endometriosis causing severe intractable constipation.  She was started on Linzess with consideration of transitioning to Movantik if intolerant.  She was also found to have chronic iron deficiency anemia with an iron level of 25.  Recommendation was for the patient to follow-up with GI after discharge but again she left AMA.   Patient presented to the ED today with report of abdominal pain and chest pain that started less than 24 hours prior to presentation.  She reports recurrent nausea and repeated emesis none witnessed since arrival.  She has subsequently undergone a CT of the abdomen which shows no acute finding but does show moderate stool burden.  Because of her chest pain and history of PE a CTA of the chest was  attempted but images were poor and recommendation was to attempt another CTA prior to discharge.  Patient has essentially been noncompliant with her anticoagulation since initially diagnosed.  She reported that during her previous hospitalization there was some discussion regarding possible discontinuation of anticoagulation.  Because she left AMA she opted not to take the medication.     Upon my evaluation of the patient she is continuing to for abdominal pain 10/10 and reports a recent episode of emesis prior to my entry into the room.  She is bright and cheerful.  She is very talkative and has discussed her multiple medical problems.  Her primary focus is on how her endometriosis is impacting her daily life in regards to pain and issues related to chronic constipation.  She states her lupus is well-controlled on her Plaquenil and she does have a rheumatologist in [place].  She states she attends online classes due to her chronic pain.  She reports that the best she felt with her endometriosis was when she was on Zoladex every 3 months.  Unfortunately she has not followed up with any GYN providers in regards to management of this medication.  The ER has asked the hospitalist to evaluate the patient for admission.  Given her presentation and still unclarified issues regarding possible PE she will be placed in a progressive care bed.  **Interim History  She continues to have some tachycardia and is extremely anxious.  Likely PE scan was repeated and showed no evidence of central PE.  Lower extremity venous duplex negative.  D-dimer was  Weight: 108.9 kg   Examination: Physical Exam:  Constitutional: WN/WD morbidly obese female who is extremely anxious Respiratory: Diminished to auscultation bilaterally, no wheezing, rales, rhonchi or crackles. Normal respiratory effort and patient is not tachypenic. No accessory muscle use.  Unlabored breathing Cardiovascular: Tachycardic rate and regular rhythm, no murmurs / rubs / gallops. S1 and S2 auscultated. No extremity edema. Abdomen: Soft, tender to palpate, distended secondary body habitus. Bowel sounds positive.  GU: Deferred. Musculoskeletal: No clubbing / cyanosis of digits/nails. No joint deformity upper and lower extremities. Skin: No rashes, lesions, ulcers on limited skin evaluation. No induration; Warm and dry.  Neurologic: CN 2-12 grossly intact with no focal deficits. Romberg sign and cerebellar reflexes not assessed.  Psychiatric: Normal judgment and insight. Alert and oriented x 3. Normal mood and appropriate affect.   Data Reviewed: I have personally reviewed following labs and  imaging studies  CBC: Recent Labs  Lab 11/10/22 0442 11/11/22 0519  WBC 7.2 10.0  HGB 11.7* 12.0  HCT 36.6 37.4  MCV 75.0* 74.1*  PLT 403* 420*   Basic Metabolic Panel: Recent Labs  Lab 11/10/22 0442 11/11/22 0519  NA 141 139  K 3.5 4.3  CL 109 108  CO2 22 22  GLUCOSE 107* 142*  BUN 8 6  CREATININE 0.82 0.70  CALCIUM 9.4 9.5  MG  --  2.1  PHOS  --  2.7   GFR: Estimated Creatinine Clearance: 120.6 mL/min (by C-G formula based on SCr of 0.7 mg/dL). Liver Function Tests: Recent Labs  Lab 11/10/22 0455 11/11/22 0519  AST 14* 19  ALT 12 14  ALKPHOS 73 70  BILITOT 0.5 0.5  PROT 7.2 7.7  ALBUMIN 4.1 3.9   Recent Labs  Lab 11/10/22 0442  LIPASE 30   No results for input(s): "AMMONIA" in the last 168 hours. Coagulation Profile: No results for input(s): "INR", "PROTIME" in the last 168 hours. Cardiac Enzymes: No results for input(s): "CKTOTAL", "CKMB", "CKMBINDEX", "TROPONINI" in the last 168 hours. BNP (last 3 results) No results for input(s): "PROBNP" in the last 8760 hours. HbA1C: No results for input(s): "HGBA1C" in the last 72 hours. CBG: No results for input(s): "GLUCAP" in the last 168 hours. Lipid Profile: No results for input(s): "CHOL", "HDL", "LDLCALC", "TRIG", "CHOLHDL", "LDLDIRECT" in the last 72 hours. Thyroid Function Tests: No results for input(s): "TSH", "T4TOTAL", "FREET4", "T3FREE", "THYROIDAB" in the last 72 hours. Anemia Panel: No results for input(s): "VITAMINB12", "FOLATE", "FERRITIN", "TIBC", "IRON", "RETICCTPCT" in the last 72 hours. Sepsis Labs: No results for input(s): "PROCALCITON", "LATICACIDVEN" in the last 168 hours.  No results found for this or any previous visit (from the past 240 hour(s)).   Radiology Studies: VAS [place] LOWER EXTREMITY VENOUS (DVT)  Result Date: 11/11/2022  Lower Venous DVT Study Patient Name:  [name]  Date of Exam:   11/11/2022 Medical Rec #: [id number]          Accession #:    [id number] Date  of Birth: 1995/06/06          Patient Gender: F Patient Age:   27 years Exam Location:  [facility] Procedure:      VAS [place] LOWER EXTREMITY VENOUS (DVT) Referring Phys: Marguerita Merles --------------------------------------------------------------------------------  Indications: Pain.  Risk Factors: Recent extended travel DVT Hx obesity. Anticoagulation: Eliquis. Comparison Study: No prior study Performing Technologist: [name]  Examination Guidelines: A complete evaluation includes B-mode imaging, spectral Doppler, color Doppler, and power Doppler as needed of all accessible portions  PROGRESS NOTE    [name]  BMW:[id number] DOB: Mar 19, 1995 DOA: 11/10/2022 PCP: Oneita Hurt, No   Brief Narrative:  HPI per Junious Silk Note:   [name] is a 27 y.o. female with medical history significant of asthma, prediabetes with hemoglobin A1c 6.3 in 2022, celiac disease, cluster B personality disorder, panic disorder, collagen vascular disease, diarrhea, endometriosis with significant bowel involvement, SLE, ovarian cyst, PE January 2022, reported seizures, class III obesity with a BMI in the 40s.  Patient has had several admissions in the past 2 to 3 years at this facility due to abdominal pain with intractable nausea and vomiting and at times has had hematemesis.  She was last evaluated at our facility and left AMA on 09/29/2022.  3 days prior to that admission she had undergone an EGD with no significant findings noted.  Also during this previous admission GI was consulted.  It was suspected that given previous negative EGDs that her intermittent hematemesis had come from a possible Mallory-Weiss tear or esophagitis from repeated nausea and vomiting.  She had apparently not picked up her PPI prior to that most recent admission.  She has also had issues related to her endometriosis causing severe intractable constipation.  She was started on Linzess with consideration of transitioning to Movantik if intolerant.  She was also found to have chronic iron deficiency anemia with an iron level of 25.  Recommendation was for the patient to follow-up with GI after discharge but again she left AMA.   Patient presented to the ED today with report of abdominal pain and chest pain that started less than 24 hours prior to presentation.  She reports recurrent nausea and repeated emesis none witnessed since arrival.  She has subsequently undergone a CT of the abdomen which shows no acute finding but does show moderate stool burden.  Because of her chest pain and history of PE a CTA of the chest was  attempted but images were poor and recommendation was to attempt another CTA prior to discharge.  Patient has essentially been noncompliant with her anticoagulation since initially diagnosed.  She reported that during her previous hospitalization there was some discussion regarding possible discontinuation of anticoagulation.  Because she left AMA she opted not to take the medication.     Upon my evaluation of the patient she is continuing to for abdominal pain 10/10 and reports a recent episode of emesis prior to my entry into the room.  She is bright and cheerful.  She is very talkative and has discussed her multiple medical problems.  Her primary focus is on how her endometriosis is impacting her daily life in regards to pain and issues related to chronic constipation.  She states her lupus is well-controlled on her Plaquenil and she does have a rheumatologist in [place].  She states she attends online classes due to her chronic pain.  She reports that the best she felt with her endometriosis was when she was on Zoladex every 3 months.  Unfortunately she has not followed up with any GYN providers in regards to management of this medication.  The ER has asked the hospitalist to evaluate the patient for admission.  Given her presentation and still unclarified issues regarding possible PE she will be placed in a progressive care bed.  **Interim History  She continues to have some tachycardia and is extremely anxious.  Likely PE scan was repeated and showed no evidence of central PE.  Lower extremity venous duplex negative.  D-dimer was  PROGRESS NOTE    [name]  BMW:[id number] DOB: Mar 19, 1995 DOA: 11/10/2022 PCP: Oneita Hurt, No   Brief Narrative:  HPI per Junious Silk Note:   [name] is a 27 y.o. female with medical history significant of asthma, prediabetes with hemoglobin A1c 6.3 in 2022, celiac disease, cluster B personality disorder, panic disorder, collagen vascular disease, diarrhea, endometriosis with significant bowel involvement, SLE, ovarian cyst, PE January 2022, reported seizures, class III obesity with a BMI in the 40s.  Patient has had several admissions in the past 2 to 3 years at this facility due to abdominal pain with intractable nausea and vomiting and at times has had hematemesis.  She was last evaluated at our facility and left AMA on 09/29/2022.  3 days prior to that admission she had undergone an EGD with no significant findings noted.  Also during this previous admission GI was consulted.  It was suspected that given previous negative EGDs that her intermittent hematemesis had come from a possible Mallory-Weiss tear or esophagitis from repeated nausea and vomiting.  She had apparently not picked up her PPI prior to that most recent admission.  She has also had issues related to her endometriosis causing severe intractable constipation.  She was started on Linzess with consideration of transitioning to Movantik if intolerant.  She was also found to have chronic iron deficiency anemia with an iron level of 25.  Recommendation was for the patient to follow-up with GI after discharge but again she left AMA.   Patient presented to the ED today with report of abdominal pain and chest pain that started less than 24 hours prior to presentation.  She reports recurrent nausea and repeated emesis none witnessed since arrival.  She has subsequently undergone a CT of the abdomen which shows no acute finding but does show moderate stool burden.  Because of her chest pain and history of PE a CTA of the chest was  attempted but images were poor and recommendation was to attempt another CTA prior to discharge.  Patient has essentially been noncompliant with her anticoagulation since initially diagnosed.  She reported that during her previous hospitalization there was some discussion regarding possible discontinuation of anticoagulation.  Because she left AMA she opted not to take the medication.     Upon my evaluation of the patient she is continuing to for abdominal pain 10/10 and reports a recent episode of emesis prior to my entry into the room.  She is bright and cheerful.  She is very talkative and has discussed her multiple medical problems.  Her primary focus is on how her endometriosis is impacting her daily life in regards to pain and issues related to chronic constipation.  She states her lupus is well-controlled on her Plaquenil and she does have a rheumatologist in [place].  She states she attends online classes due to her chronic pain.  She reports that the best she felt with her endometriosis was when she was on Zoladex every 3 months.  Unfortunately she has not followed up with any GYN providers in regards to management of this medication.  The ER has asked the hospitalist to evaluate the patient for admission.  Given her presentation and still unclarified issues regarding possible PE she will be placed in a progressive care bed.  **Interim History  She continues to have some tachycardia and is extremely anxious.  Likely PE scan was repeated and showed no evidence of central PE.  Lower extremity venous duplex negative.  D-dimer was

## 2022-11-11 NOTE — Progress Notes (Signed)
Lower extremity venous duplex completed. Please see CV Procedures for preliminary results.  [name], RVT 11/11/22 9:52 AM

## 2022-11-11 NOTE — Hospital Course (Addendum)
[name] is a 27 y.o. female with PMH asthma, prediabetes, celiac, cluster B personality disorder, panic disorder, collagen vascular disease, endometriosis, lupus, ovarian cyst, PE, reported seizures, obesity.  She presented with ongoing abdominal pain and nausea/vomiting.  Multiple hospitalizations in the past for similar. Some issues with constipation as well contributing to symptoms in the past.  She was admitted for pain and nausea control with diet advancement as tolerated.  Assessment and Plan:  Abdominal pain Endometriosis Constipation N/V -CT abdomen/pelvis on admission negative for acute findings in the abdomen or pelvis.  Moderate stool burden appreciated involving terminal ileum; this may be contributing to her overall symptoms along with underlying endometriosis -Continuing on pain regimen and nausea regimen - Continue current diet - continue laxative regimen - trial of bentyl also    Chest Pain and history of PE noncompliant with anticoagulation -Troponin unremarkable and EKG nonischemic CTA done initially was nondiagnostic and recommendations are to repeat -Checked D-dimer and was 0.74 and repeat check negative; LE duplex negative for DVT also - anticoagulation stopped   Tachycardia -Suspect multifactorial.  Some contribution from underlying anxiety but also some probable volume depletion from poor intake and/or even some pain relation  Leukocytosis -resolved -Suspected reactive and now resolved   History of GERD and prior hematemesis and suspected prior Mallory-Weiss tear -Resume Protonix daily and add magnesium to prevent leg cramps -Magnesium will also help with constipation - Hgb stable   Iron deficiency anemia with Microcytosis  - outpatient iron labs - iron supplement would increase risk of constipation further also    Prediabetes -Repeat hemoglobin A1c was 5.8 -Patient is interested in starting Ozempic or another GLP-1 as this will assist with her  weight loss cautioned her about this type of medication especially with her pre-existing bowel conditions -this medicine could be contraindicated for her and could potentially lead to a paralytic ileus.   -She does report she has had issues with ileus in the past secondary to bowel complications from her endometriosis   SLE/collagen vascular disorder -Continue Hydroxychloroquine 400 mg po Daily  -Patient needs to establish with a rheumatologist in the [facility] area if she continues to live here  Thrombocytosis -Considered reactive and has also normalized   Endometriosis with acute on chronic Pain -Patient reports significant improvement in symptoms after the administration of Zoladex.  Patient has not followed up with GYN locally.   -She must establish with a GYN if she is to be considered a candidate for this medication again. -Evaluated by GYN during previous hospitalization with recommendation to follow-up with Dr.Ajewole who specializes in complex endometriosis with severe bowel involvement -Patient not a candidate for NSAIDs due to history of hematemesis as well as coadministration of NOAC   Bipolar Disorder  Depression and Anxiety -Patient states previously prescribed Celexa ineffective and is requesting to resume Zoloft -Restarted Zoloft at 25 mg daily.  Patient aware that this medication needs to be titrated up and takes between 4 to 6 weeks to have any effect on her symptoms-she verbalized understanding -Reviewed previous psych notes and patient had been prescribed Xanax 1 mg twice daily as needed which will be resumed -Very Anxious discussed with her about getting a psych consult and she refused   Asthma -asymptomatic and apparently not on medications prior to admission  Morbid Obesity -Complicates overall prognosis and care -Estimated body mass index is 43.9 kg/m as calculated from the following:   Height as of this encounter: 5\' 2"  (1.575 m).   Weight as  of this  encounter: 108.9 kg.  -Weight Loss and Dietary Counseling given

## 2022-11-12 ENCOUNTER — Inpatient Hospital Stay (HOSPITAL_COMMUNITY): Payer: BC Managed Care – PPO

## 2022-11-12 DIAGNOSIS — R0602 Shortness of breath: Secondary | ICD-10-CM

## 2022-11-12 DIAGNOSIS — R109 Unspecified abdominal pain: Secondary | ICD-10-CM | POA: Diagnosis not present

## 2022-11-12 DIAGNOSIS — K5900 Constipation, unspecified: Secondary | ICD-10-CM

## 2022-11-12 DIAGNOSIS — R112 Nausea with vomiting, unspecified: Secondary | ICD-10-CM | POA: Diagnosis not present

## 2022-11-12 LAB — CBC WITH DIFFERENTIAL/PLATELET
Abs Immature Granulocytes: 0.04 10*3/uL (ref 0.00–0.07)
Abs Immature Granulocytes: 0.03 10*3/uL (ref 0.00–0.07)
Basophils Absolute: 0 10*3/uL (ref 0.0–0.1)
Basophils Absolute: 0 10*3/uL (ref 0.0–0.1)
Basophils Relative: 0 %
Basophils Relative: 0 %
Eosinophils Absolute: 0.1 10*3/uL (ref 0.0–0.5)
Eosinophils Absolute: 0.2 10*3/uL (ref 0.0–0.5)
Eosinophils Relative: 1 %
Eosinophils Relative: 2 %
HCT: 38 % (ref 36.0–46.0)
HCT: 34.9 % — ABNORMAL LOW (ref 36.0–46.0)
Hemoglobin: 11.8 g/dL — ABNORMAL LOW (ref 12.0–15.0)
Hemoglobin: 11.2 g/dL — ABNORMAL LOW (ref 12.0–15.0)
Immature Granulocytes: 0 %
Immature Granulocytes: 0 %
Lymphocytes Relative: 44 %
Lymphocytes Relative: 49 %
Lymphs Abs: 6.1 10*3/uL — ABNORMAL HIGH (ref 0.7–4.0)
Lymphs Abs: 4.3 10*3/uL — ABNORMAL HIGH (ref 0.7–4.0)
MCH: 24.1 pg — ABNORMAL LOW (ref 26.0–34.0)
MCH: 24 pg — ABNORMAL LOW (ref 26.0–34.0)
MCHC: 31.1 g/dL (ref 30.0–36.0)
MCHC: 32.1 g/dL (ref 30.0–36.0)
MCV: 77.6 fL — ABNORMAL LOW (ref 80.0–100.0)
MCV: 74.9 fL — ABNORMAL LOW (ref 80.0–100.0)
Monocytes Absolute: 0.9 10*3/uL (ref 0.1–1.0)
Monocytes Absolute: 0.7 10*3/uL (ref 0.1–1.0)
Monocytes Relative: 6 %
Monocytes Relative: 8 %
Neutro Abs: 6.8 10*3/uL (ref 1.7–7.7)
Neutro Abs: 3.6 10*3/uL (ref 1.7–7.7)
Neutrophils Relative %: 49 %
Neutrophils Relative %: 41 %
Platelets: 409 10*3/uL — ABNORMAL HIGH (ref 150–400)
Platelets: 405 10*3/uL — ABNORMAL HIGH (ref 150–400)
RBC: 4.9 MIL/uL (ref 3.87–5.11)
RBC: 4.66 MIL/uL (ref 3.87–5.11)
RDW: 17.6 % — ABNORMAL HIGH (ref 11.5–15.5)
RDW: 17.4 % — ABNORMAL HIGH (ref 11.5–15.5)
WBC: 13.9 10*3/uL — ABNORMAL HIGH (ref 4.0–10.5)
WBC: 8.8 10*3/uL (ref 4.0–10.5)
nRBC: 0 % (ref 0.0–0.2)
nRBC: 0 % (ref 0.0–0.2)

## 2022-11-12 LAB — COMPREHENSIVE METABOLIC PANEL
ALT: 13 U/L (ref 0–44)
AST: 13 U/L — ABNORMAL LOW (ref 15–41)
Albumin: 3.8 g/dL (ref 3.5–5.0)
Alkaline Phosphatase: 70 U/L (ref 38–126)
Anion gap: 11 (ref 5–15)
BUN: 12 mg/dL (ref 6–20)
CO2: 21 mmol/L — ABNORMAL LOW (ref 22–32)
Calcium: 8.6 mg/dL — ABNORMAL LOW (ref 8.9–10.3)
Chloride: 105 mmol/L (ref 98–111)
Creatinine, Ser: 0.84 mg/dL (ref 0.44–1.00)
GFR, Estimated: >60 mL/min (ref >60)
Glucose, Bld: 124 mg/dL — ABNORMAL HIGH (ref 70–99)
Potassium: 3.7 mmol/L (ref 3.5–5.1)
Sodium: 137 mmol/L (ref 135–145)
Total Bilirubin: 0.3 mg/dL (ref 0.3–1.2)
Total Protein: 6.5 g/dL (ref 6.5–8.1)

## 2022-11-12 LAB — HEMOGLOBIN A1C
Hgb A1c MFr Bld: 5.8 % — ABNORMAL HIGH (ref 4.8–5.6)
Mean Plasma Glucose: 119.76 mg/dL

## 2022-11-12 LAB — MAGNESIUM: Magnesium: 2.2 mg/dL (ref 1.7–2.4)

## 2022-11-12 LAB — D-DIMER, QUANTITATIVE: D-Dimer, Quant: 0.36 ug{FEU}/mL (ref 0.00–0.50)

## 2022-11-12 LAB — PHOSPHORUS: Phosphorus: 3.9 mg/dL (ref 2.5–4.6)

## 2022-11-12 MED ORDER — SODIUM CHLORIDE 0.9 % IV BOLUS
1000.0000 mL | Freq: Once | INTRAVENOUS | Status: AC
  Administered 2022-11-12: 1000 mL via INTRAVENOUS

## 2022-11-12 MED ORDER — DIPHENHYDRAMINE HCL 50 MG/ML IJ SOLN
25.0000 mg | Freq: Once | INTRAMUSCULAR | Status: AC
  Administered 2022-11-12: 25 mg via INTRAVENOUS
  Filled 2022-11-12: qty 1

## 2022-11-12 MED ORDER — BISACODYL 10 MG RE SUPP
10.0000 mg | Freq: Every day | RECTAL | Status: DC | PRN
  Administered 2022-11-13: 10 mg via RECTAL
  Filled 2022-11-12: qty 1

## 2022-11-12 MED ORDER — DIPHENHYDRAMINE HCL 50 MG/ML IJ SOLN
25.0000 mg | Freq: Four times a day (QID) | INTRAMUSCULAR | Status: DC | PRN
  Administered 2022-11-12 – 2022-11-14 (×9): 50 mg via INTRAVENOUS
  Administered 2022-11-15: 25 mg via INTRAVENOUS
  Administered 2022-11-15 (×3): 50 mg via INTRAVENOUS
  Administered 2022-11-15: 25 mg via INTRAVENOUS
  Administered 2022-11-16: 50 mg via INTRAVENOUS
  Filled 2022-11-12 (×16): qty 1

## 2022-11-12 NOTE — Plan of Care (Signed)

## 2022-11-12 NOTE — Progress Notes (Signed)
PROGRESS NOTE    [name]  QMV:[id number] DOB: 06/08/1995 DOA: 11/10/2022 PCP: Oneita Hurt, No   Brief Narrative:  HPI per Junious Silk Note:   [name] is a 27 y.o. female with medical history significant of asthma, prediabetes with hemoglobin A1c 6.3 in 2022, celiac disease, cluster B personality disorder, panic disorder, collagen vascular disease, diarrhea, endometriosis with significant bowel involvement, SLE, ovarian cyst, PE January 2022, reported seizures, class III obesity with a BMI in the 40s.  Patient has had several admissions in the past 2 to 3 years at this facility due to abdominal pain with intractable nausea and vomiting and at times has had hematemesis.  She was last evaluated at our facility and left AMA on 09/29/2022.  3 days prior to that admission she had undergone an EGD with no significant findings noted.  Also during this previous admission GI was consulted.  It was suspected that given previous negative EGDs that her intermittent hematemesis had come from a possible Mallory-Weiss tear or esophagitis from repeated nausea and vomiting.  She had apparently not picked up her PPI prior to that most recent admission.  She has also had issues related to her endometriosis causing severe intractable constipation.  She was started on Linzess with consideration of transitioning to Movantik if intolerant.  She was also found to have chronic iron deficiency anemia with an iron level of 25.  Recommendation was for the patient to follow-up with GI after discharge but again she left AMA.   Patient presented to the ED today with report of abdominal pain and chest pain that started less than 24 hours prior to presentation.  She reports recurrent nausea and repeated emesis none witnessed since arrival.  She has subsequently undergone a CT of the abdomen which shows no acute finding but does show moderate stool burden.  Because of her chest pain and history of PE a CTA of the chest was  attempted but images were poor and recommendation was to attempt another CTA prior to discharge.  Patient has essentially been noncompliant with her anticoagulation since initially diagnosed.  She reported that during her previous hospitalization there was some discussion regarding possible discontinuation of anticoagulation.  Because she left AMA she opted not to take the medication.     Upon my evaluation of the patient she is continuing to for abdominal pain 10/10 and reports a recent episode of emesis prior to my entry into the room.  She is bright and cheerful.  She is very talkative and has discussed her multiple medical problems.  Her primary focus is on how her endometriosis is impacting her daily life in regards to pain and issues related to chronic constipation.  She states her lupus is well-controlled on her Plaquenil and she does have a rheumatologist in [place].  She states she attends online classes due to her chronic pain.  She reports that the best she felt with her endometriosis was when she was on Zoladex every 3 months.  Unfortunately she has not followed up with any GYN providers in regards to management of this medication.  The ER has asked the hospitalist to evaluate the patient for admission.  Given her presentation and still unclarified issues regarding possible PE she will be placed in a progressive care bed.  **Interim History  She continues to have some tachycardia and is extremely anxious her heart rates are slowly improving.  PE scan was repeated and showed no evidence of central PE.Marland Kitchen  Lower extremity venous  PROGRESS NOTE    [name]  QMV:[id number] DOB: 06/08/1995 DOA: 11/10/2022 PCP: Oneita Hurt, No   Brief Narrative:  HPI per Junious Silk Note:   [name] is a 27 y.o. female with medical history significant of asthma, prediabetes with hemoglobin A1c 6.3 in 2022, celiac disease, cluster B personality disorder, panic disorder, collagen vascular disease, diarrhea, endometriosis with significant bowel involvement, SLE, ovarian cyst, PE January 2022, reported seizures, class III obesity with a BMI in the 40s.  Patient has had several admissions in the past 2 to 3 years at this facility due to abdominal pain with intractable nausea and vomiting and at times has had hematemesis.  She was last evaluated at our facility and left AMA on 09/29/2022.  3 days prior to that admission she had undergone an EGD with no significant findings noted.  Also during this previous admission GI was consulted.  It was suspected that given previous negative EGDs that her intermittent hematemesis had come from a possible Mallory-Weiss tear or esophagitis from repeated nausea and vomiting.  She had apparently not picked up her PPI prior to that most recent admission.  She has also had issues related to her endometriosis causing severe intractable constipation.  She was started on Linzess with consideration of transitioning to Movantik if intolerant.  She was also found to have chronic iron deficiency anemia with an iron level of 25.  Recommendation was for the patient to follow-up with GI after discharge but again she left AMA.   Patient presented to the ED today with report of abdominal pain and chest pain that started less than 24 hours prior to presentation.  She reports recurrent nausea and repeated emesis none witnessed since arrival.  She has subsequently undergone a CT of the abdomen which shows no acute finding but does show moderate stool burden.  Because of her chest pain and history of PE a CTA of the chest was  attempted but images were poor and recommendation was to attempt another CTA prior to discharge.  Patient has essentially been noncompliant with her anticoagulation since initially diagnosed.  She reported that during her previous hospitalization there was some discussion regarding possible discontinuation of anticoagulation.  Because she left AMA she opted not to take the medication.     Upon my evaluation of the patient she is continuing to for abdominal pain 10/10 and reports a recent episode of emesis prior to my entry into the room.  She is bright and cheerful.  She is very talkative and has discussed her multiple medical problems.  Her primary focus is on how her endometriosis is impacting her daily life in regards to pain and issues related to chronic constipation.  She states her lupus is well-controlled on her Plaquenil and she does have a rheumatologist in [place].  She states she attends online classes due to her chronic pain.  She reports that the best she felt with her endometriosis was when she was on Zoladex every 3 months.  Unfortunately she has not followed up with any GYN providers in regards to management of this medication.  The ER has asked the hospitalist to evaluate the patient for admission.  Given her presentation and still unclarified issues regarding possible PE she will be placed in a progressive care bed.  **Interim History  She continues to have some tachycardia and is extremely anxious her heart rates are slowly improving.  PE scan was repeated and showed no evidence of central PE.Marland Kitchen  Lower extremity venous  Profile: No results for input(s): "INR", "PROTIME" in the last 168 hours. Cardiac Enzymes: No results for input(s): "CKTOTAL", "CKMB", "CKMBINDEX", "TROPONINI" in the last 168 hours. BNP (last 3 results) No results for input(s): "PROBNP" in the last 8760 hours. HbA1C: Recent Labs    11/12/22 0447  HGBA1C 5.8*   CBG: No results for input(s): "GLUCAP" in the last 168 hours. Lipid Profile: No results for input(s): "CHOL", "HDL", "LDLCALC", "TRIG", "CHOLHDL", "LDLDIRECT" in the last 72 hours. Thyroid Function Tests: No results for input(s): "TSH", "T4TOTAL", "FREET4", "T3FREE", "THYROIDAB" in the last 72 hours. Anemia Panel: No results for input(s): "VITAMINB12", "FOLATE", "FERRITIN", "TIBC", "IRON", "RETICCTPCT" in the last 72 hours. Sepsis Labs: No results for input(s): "PROCALCITON", "LATICACIDVEN" in the last 168 hours.  No results found for this or any previous visit (from the past 240 hour(s)).   Radiology Studies: DG Abd 1 View  Result Date: 11/12/2022 CLINICAL DATA:  Abdominal distension.  Nausea and vomiting. EXAM: ABDOMEN - 1 VIEW COMPARISON:  CT 11/10/2022 FINDINGS: No bowel dilatation or evidence of obstruction. Small to moderate volume of colonic stool. Pelvic phleboliths. IMPRESSION: Nonobstructive bowel gas pattern. Small to moderate colonic stool burden. Electronically Signed   By: Narda Rutherford M.D.   On: 11/12/2022 16:17   DG CHEST PORT 1 VIEW  Result Date: 11/12/2022 CLINICAL DATA:  Shortness of breath. EXAM: PORTABLE CHEST 1 VIEW COMPARISON:  Radiograph 11/10/2022,  CT 11/11/2022 FINDINGS: The cardiomediastinal contours are normal. The lungs are clear. Pulmonary vasculature is normal. No consolidation, pleural effusion, or pneumothorax. No acute osseous abnormalities are seen. IMPRESSION: No active disease. Electronically Signed   By: Narda Rutherford M.D.   On: 11/12/2022 16:16   VAS [place] LOWER EXTREMITY VENOUS (DVT)  Result Date: 11/11/2022  Lower Venous DVT Study Patient Name:  [name]  Date of Exam:   11/11/2022 Medical Rec #: [id number]          Accession #:    [id number] Date of Birth: 1995/04/05          Patient Gender: F Patient Age:   36 years Exam Location:  [facility] Procedure:      VAS [place] LOWER EXTREMITY VENOUS (DVT) Referring Phys: Marguerita Merles --------------------------------------------------------------------------------  Indications: Pain.  Risk Factors: Recent extended travel DVT Hx obesity. Anticoagulation: Eliquis. Comparison Study: No prior study Performing Technologist: [name]  Examination Guidelines: A complete evaluation includes B-mode imaging, spectral Doppler, color Doppler, and power Doppler as needed of all accessible portions of each vessel. Bilateral testing is considered an integral part of a complete examination. Limited examinations for reoccurring indications may be performed as noted. The reflux portion of the exam is performed with the patient in reverse Trendelenburg.  +---------+---------------+---------+-----------+----------+--------------+ RIGHT    CompressibilityPhasicitySpontaneityPropertiesThrombus Aging +---------+---------------+---------+-----------+----------+--------------+ CFV      Full           Yes      Yes                                 +---------+---------------+---------+-----------+----------+--------------+ SFJ      Full                                                        +---------+---------------+---------+-----------+----------+--------------+ FV Prox  Full                                                         +---------+---------------+---------+-----------+----------+--------------+  PROGRESS NOTE    [name]  QMV:[id number] DOB: 06/08/1995 DOA: 11/10/2022 PCP: Oneita Hurt, No   Brief Narrative:  HPI per Junious Silk Note:   [name] is a 27 y.o. female with medical history significant of asthma, prediabetes with hemoglobin A1c 6.3 in 2022, celiac disease, cluster B personality disorder, panic disorder, collagen vascular disease, diarrhea, endometriosis with significant bowel involvement, SLE, ovarian cyst, PE January 2022, reported seizures, class III obesity with a BMI in the 40s.  Patient has had several admissions in the past 2 to 3 years at this facility due to abdominal pain with intractable nausea and vomiting and at times has had hematemesis.  She was last evaluated at our facility and left AMA on 09/29/2022.  3 days prior to that admission she had undergone an EGD with no significant findings noted.  Also during this previous admission GI was consulted.  It was suspected that given previous negative EGDs that her intermittent hematemesis had come from a possible Mallory-Weiss tear or esophagitis from repeated nausea and vomiting.  She had apparently not picked up her PPI prior to that most recent admission.  She has also had issues related to her endometriosis causing severe intractable constipation.  She was started on Linzess with consideration of transitioning to Movantik if intolerant.  She was also found to have chronic iron deficiency anemia with an iron level of 25.  Recommendation was for the patient to follow-up with GI after discharge but again she left AMA.   Patient presented to the ED today with report of abdominal pain and chest pain that started less than 24 hours prior to presentation.  She reports recurrent nausea and repeated emesis none witnessed since arrival.  She has subsequently undergone a CT of the abdomen which shows no acute finding but does show moderate stool burden.  Because of her chest pain and history of PE a CTA of the chest was  attempted but images were poor and recommendation was to attempt another CTA prior to discharge.  Patient has essentially been noncompliant with her anticoagulation since initially diagnosed.  She reported that during her previous hospitalization there was some discussion regarding possible discontinuation of anticoagulation.  Because she left AMA she opted not to take the medication.     Upon my evaluation of the patient she is continuing to for abdominal pain 10/10 and reports a recent episode of emesis prior to my entry into the room.  She is bright and cheerful.  She is very talkative and has discussed her multiple medical problems.  Her primary focus is on how her endometriosis is impacting her daily life in regards to pain and issues related to chronic constipation.  She states her lupus is well-controlled on her Plaquenil and she does have a rheumatologist in [place].  She states she attends online classes due to her chronic pain.  She reports that the best she felt with her endometriosis was when she was on Zoladex every 3 months.  Unfortunately she has not followed up with any GYN providers in regards to management of this medication.  The ER has asked the hospitalist to evaluate the patient for admission.  Given her presentation and still unclarified issues regarding possible PE she will be placed in a progressive care bed.  **Interim History  She continues to have some tachycardia and is extremely anxious her heart rates are slowly improving.  PE scan was repeated and showed no evidence of central PE.Marland Kitchen  Lower extremity venous  Profile: No results for input(s): "INR", "PROTIME" in the last 168 hours. Cardiac Enzymes: No results for input(s): "CKTOTAL", "CKMB", "CKMBINDEX", "TROPONINI" in the last 168 hours. BNP (last 3 results) No results for input(s): "PROBNP" in the last 8760 hours. HbA1C: Recent Labs    11/12/22 0447  HGBA1C 5.8*   CBG: No results for input(s): "GLUCAP" in the last 168 hours. Lipid Profile: No results for input(s): "CHOL", "HDL", "LDLCALC", "TRIG", "CHOLHDL", "LDLDIRECT" in the last 72 hours. Thyroid Function Tests: No results for input(s): "TSH", "T4TOTAL", "FREET4", "T3FREE", "THYROIDAB" in the last 72 hours. Anemia Panel: No results for input(s): "VITAMINB12", "FOLATE", "FERRITIN", "TIBC", "IRON", "RETICCTPCT" in the last 72 hours. Sepsis Labs: No results for input(s): "PROCALCITON", "LATICACIDVEN" in the last 168 hours.  No results found for this or any previous visit (from the past 240 hour(s)).   Radiology Studies: DG Abd 1 View  Result Date: 11/12/2022 CLINICAL DATA:  Abdominal distension.  Nausea and vomiting. EXAM: ABDOMEN - 1 VIEW COMPARISON:  CT 11/10/2022 FINDINGS: No bowel dilatation or evidence of obstruction. Small to moderate volume of colonic stool. Pelvic phleboliths. IMPRESSION: Nonobstructive bowel gas pattern. Small to moderate colonic stool burden. Electronically Signed   By: Narda Rutherford M.D.   On: 11/12/2022 16:17   DG CHEST PORT 1 VIEW  Result Date: 11/12/2022 CLINICAL DATA:  Shortness of breath. EXAM: PORTABLE CHEST 1 VIEW COMPARISON:  Radiograph 11/10/2022,  CT 11/11/2022 FINDINGS: The cardiomediastinal contours are normal. The lungs are clear. Pulmonary vasculature is normal. No consolidation, pleural effusion, or pneumothorax. No acute osseous abnormalities are seen. IMPRESSION: No active disease. Electronically Signed   By: Narda Rutherford M.D.   On: 11/12/2022 16:16   VAS [place] LOWER EXTREMITY VENOUS (DVT)  Result Date: 11/11/2022  Lower Venous DVT Study Patient Name:  [name]  Date of Exam:   11/11/2022 Medical Rec #: [id number]          Accession #:    [id number] Date of Birth: 1995/04/05          Patient Gender: F Patient Age:   36 years Exam Location:  [facility] Procedure:      VAS [place] LOWER EXTREMITY VENOUS (DVT) Referring Phys: Marguerita Merles --------------------------------------------------------------------------------  Indications: Pain.  Risk Factors: Recent extended travel DVT Hx obesity. Anticoagulation: Eliquis. Comparison Study: No prior study Performing Technologist: [name]  Examination Guidelines: A complete evaluation includes B-mode imaging, spectral Doppler, color Doppler, and power Doppler as needed of all accessible portions of each vessel. Bilateral testing is considered an integral part of a complete examination. Limited examinations for reoccurring indications may be performed as noted. The reflux portion of the exam is performed with the patient in reverse Trendelenburg.  +---------+---------------+---------+-----------+----------+--------------+ RIGHT    CompressibilityPhasicitySpontaneityPropertiesThrombus Aging +---------+---------------+---------+-----------+----------+--------------+ CFV      Full           Yes      Yes                                 +---------+---------------+---------+-----------+----------+--------------+ SFJ      Full                                                        +---------+---------------+---------+-----------+----------+--------------+ FV Prox  Full                                                         +---------+---------------+---------+-----------+----------+--------------+  PROGRESS NOTE    [name]  QMV:[id number] DOB: 06/08/1995 DOA: 11/10/2022 PCP: Oneita Hurt, No   Brief Narrative:  HPI per Junious Silk Note:   [name] is a 27 y.o. female with medical history significant of asthma, prediabetes with hemoglobin A1c 6.3 in 2022, celiac disease, cluster B personality disorder, panic disorder, collagen vascular disease, diarrhea, endometriosis with significant bowel involvement, SLE, ovarian cyst, PE January 2022, reported seizures, class III obesity with a BMI in the 40s.  Patient has had several admissions in the past 2 to 3 years at this facility due to abdominal pain with intractable nausea and vomiting and at times has had hematemesis.  She was last evaluated at our facility and left AMA on 09/29/2022.  3 days prior to that admission she had undergone an EGD with no significant findings noted.  Also during this previous admission GI was consulted.  It was suspected that given previous negative EGDs that her intermittent hematemesis had come from a possible Mallory-Weiss tear or esophagitis from repeated nausea and vomiting.  She had apparently not picked up her PPI prior to that most recent admission.  She has also had issues related to her endometriosis causing severe intractable constipation.  She was started on Linzess with consideration of transitioning to Movantik if intolerant.  She was also found to have chronic iron deficiency anemia with an iron level of 25.  Recommendation was for the patient to follow-up with GI after discharge but again she left AMA.   Patient presented to the ED today with report of abdominal pain and chest pain that started less than 24 hours prior to presentation.  She reports recurrent nausea and repeated emesis none witnessed since arrival.  She has subsequently undergone a CT of the abdomen which shows no acute finding but does show moderate stool burden.  Because of her chest pain and history of PE a CTA of the chest was  attempted but images were poor and recommendation was to attempt another CTA prior to discharge.  Patient has essentially been noncompliant with her anticoagulation since initially diagnosed.  She reported that during her previous hospitalization there was some discussion regarding possible discontinuation of anticoagulation.  Because she left AMA she opted not to take the medication.     Upon my evaluation of the patient she is continuing to for abdominal pain 10/10 and reports a recent episode of emesis prior to my entry into the room.  She is bright and cheerful.  She is very talkative and has discussed her multiple medical problems.  Her primary focus is on how her endometriosis is impacting her daily life in regards to pain and issues related to chronic constipation.  She states her lupus is well-controlled on her Plaquenil and she does have a rheumatologist in [place].  She states she attends online classes due to her chronic pain.  She reports that the best she felt with her endometriosis was when she was on Zoladex every 3 months.  Unfortunately she has not followed up with any GYN providers in regards to management of this medication.  The ER has asked the hospitalist to evaluate the patient for admission.  Given her presentation and still unclarified issues regarding possible PE she will be placed in a progressive care bed.  **Interim History  She continues to have some tachycardia and is extremely anxious her heart rates are slowly improving.  PE scan was repeated and showed no evidence of central PE.Marland Kitchen  Lower extremity venous  Profile: No results for input(s): "INR", "PROTIME" in the last 168 hours. Cardiac Enzymes: No results for input(s): "CKTOTAL", "CKMB", "CKMBINDEX", "TROPONINI" in the last 168 hours. BNP (last 3 results) No results for input(s): "PROBNP" in the last 8760 hours. HbA1C: Recent Labs    11/12/22 0447  HGBA1C 5.8*   CBG: No results for input(s): "GLUCAP" in the last 168 hours. Lipid Profile: No results for input(s): "CHOL", "HDL", "LDLCALC", "TRIG", "CHOLHDL", "LDLDIRECT" in the last 72 hours. Thyroid Function Tests: No results for input(s): "TSH", "T4TOTAL", "FREET4", "T3FREE", "THYROIDAB" in the last 72 hours. Anemia Panel: No results for input(s): "VITAMINB12", "FOLATE", "FERRITIN", "TIBC", "IRON", "RETICCTPCT" in the last 72 hours. Sepsis Labs: No results for input(s): "PROCALCITON", "LATICACIDVEN" in the last 168 hours.  No results found for this or any previous visit (from the past 240 hour(s)).   Radiology Studies: DG Abd 1 View  Result Date: 11/12/2022 CLINICAL DATA:  Abdominal distension.  Nausea and vomiting. EXAM: ABDOMEN - 1 VIEW COMPARISON:  CT 11/10/2022 FINDINGS: No bowel dilatation or evidence of obstruction. Small to moderate volume of colonic stool. Pelvic phleboliths. IMPRESSION: Nonobstructive bowel gas pattern. Small to moderate colonic stool burden. Electronically Signed   By: Narda Rutherford M.D.   On: 11/12/2022 16:17   DG CHEST PORT 1 VIEW  Result Date: 11/12/2022 CLINICAL DATA:  Shortness of breath. EXAM: PORTABLE CHEST 1 VIEW COMPARISON:  Radiograph 11/10/2022,  CT 11/11/2022 FINDINGS: The cardiomediastinal contours are normal. The lungs are clear. Pulmonary vasculature is normal. No consolidation, pleural effusion, or pneumothorax. No acute osseous abnormalities are seen. IMPRESSION: No active disease. Electronically Signed   By: Narda Rutherford M.D.   On: 11/12/2022 16:16   VAS [place] LOWER EXTREMITY VENOUS (DVT)  Result Date: 11/11/2022  Lower Venous DVT Study Patient Name:  [name]  Date of Exam:   11/11/2022 Medical Rec #: [id number]          Accession #:    [id number] Date of Birth: 1995/04/05          Patient Gender: F Patient Age:   36 years Exam Location:  [facility] Procedure:      VAS [place] LOWER EXTREMITY VENOUS (DVT) Referring Phys: Marguerita Merles --------------------------------------------------------------------------------  Indications: Pain.  Risk Factors: Recent extended travel DVT Hx obesity. Anticoagulation: Eliquis. Comparison Study: No prior study Performing Technologist: [name]  Examination Guidelines: A complete evaluation includes B-mode imaging, spectral Doppler, color Doppler, and power Doppler as needed of all accessible portions of each vessel. Bilateral testing is considered an integral part of a complete examination. Limited examinations for reoccurring indications may be performed as noted. The reflux portion of the exam is performed with the patient in reverse Trendelenburg.  +---------+---------------+---------+-----------+----------+--------------+ RIGHT    CompressibilityPhasicitySpontaneityPropertiesThrombus Aging +---------+---------------+---------+-----------+----------+--------------+ CFV      Full           Yes      Yes                                 +---------+---------------+---------+-----------+----------+--------------+ SFJ      Full                                                        +---------+---------------+---------+-----------+----------+--------------+ FV Prox  Full                                                         +---------+---------------+---------+-----------+----------+--------------+  PROGRESS NOTE    [name]  QMV:[id number] DOB: 06/08/1995 DOA: 11/10/2022 PCP: Oneita Hurt, No   Brief Narrative:  HPI per Junious Silk Note:   [name] is a 27 y.o. female with medical history significant of asthma, prediabetes with hemoglobin A1c 6.3 in 2022, celiac disease, cluster B personality disorder, panic disorder, collagen vascular disease, diarrhea, endometriosis with significant bowel involvement, SLE, ovarian cyst, PE January 2022, reported seizures, class III obesity with a BMI in the 40s.  Patient has had several admissions in the past 2 to 3 years at this facility due to abdominal pain with intractable nausea and vomiting and at times has had hematemesis.  She was last evaluated at our facility and left AMA on 09/29/2022.  3 days prior to that admission she had undergone an EGD with no significant findings noted.  Also during this previous admission GI was consulted.  It was suspected that given previous negative EGDs that her intermittent hematemesis had come from a possible Mallory-Weiss tear or esophagitis from repeated nausea and vomiting.  She had apparently not picked up her PPI prior to that most recent admission.  She has also had issues related to her endometriosis causing severe intractable constipation.  She was started on Linzess with consideration of transitioning to Movantik if intolerant.  She was also found to have chronic iron deficiency anemia with an iron level of 25.  Recommendation was for the patient to follow-up with GI after discharge but again she left AMA.   Patient presented to the ED today with report of abdominal pain and chest pain that started less than 24 hours prior to presentation.  She reports recurrent nausea and repeated emesis none witnessed since arrival.  She has subsequently undergone a CT of the abdomen which shows no acute finding but does show moderate stool burden.  Because of her chest pain and history of PE a CTA of the chest was  attempted but images were poor and recommendation was to attempt another CTA prior to discharge.  Patient has essentially been noncompliant with her anticoagulation since initially diagnosed.  She reported that during her previous hospitalization there was some discussion regarding possible discontinuation of anticoagulation.  Because she left AMA she opted not to take the medication.     Upon my evaluation of the patient she is continuing to for abdominal pain 10/10 and reports a recent episode of emesis prior to my entry into the room.  She is bright and cheerful.  She is very talkative and has discussed her multiple medical problems.  Her primary focus is on how her endometriosis is impacting her daily life in regards to pain and issues related to chronic constipation.  She states her lupus is well-controlled on her Plaquenil and she does have a rheumatologist in [place].  She states she attends online classes due to her chronic pain.  She reports that the best she felt with her endometriosis was when she was on Zoladex every 3 months.  Unfortunately she has not followed up with any GYN providers in regards to management of this medication.  The ER has asked the hospitalist to evaluate the patient for admission.  Given her presentation and still unclarified issues regarding possible PE she will be placed in a progressive care bed.  **Interim History  She continues to have some tachycardia and is extremely anxious her heart rates are slowly improving.  PE scan was repeated and showed no evidence of central PE.Marland Kitchen  Lower extremity venous

## 2022-11-12 NOTE — Plan of Care (Signed)
  Problem: Clinical Measurements: Goal: Will remain free from infection Outcome: Progressing   Problem: Coping: Goal: Level of anxiety will decrease Outcome: Not Progressing   Problem: Pain Managment: Goal: General experience of comfort will improve Outcome: Progressing

## 2022-11-13 DIAGNOSIS — K5900 Constipation, unspecified: Secondary | ICD-10-CM | POA: Diagnosis not present

## 2022-11-13 DIAGNOSIS — R0602 Shortness of breath: Secondary | ICD-10-CM | POA: Diagnosis not present

## 2022-11-13 DIAGNOSIS — R109 Unspecified abdominal pain: Secondary | ICD-10-CM | POA: Diagnosis not present

## 2022-11-13 DIAGNOSIS — R112 Nausea with vomiting, unspecified: Secondary | ICD-10-CM | POA: Diagnosis not present

## 2022-11-13 LAB — COMPREHENSIVE METABOLIC PANEL
ALT: 13 U/L (ref 0–44)
AST: 14 U/L — ABNORMAL LOW (ref 15–41)
Albumin: 3.8 g/dL (ref 3.5–5.0)
Alkaline Phosphatase: 64 U/L (ref 38–126)
Anion gap: 11 (ref 5–15)
BUN: 9 mg/dL (ref 6–20)
CO2: 23 mmol/L (ref 22–32)
Calcium: 9.1 mg/dL (ref 8.9–10.3)
Chloride: 106 mmol/L (ref 98–111)
Creatinine, Ser: 0.67 mg/dL (ref 0.44–1.00)
GFR, Estimated: >60 mL/min (ref >60)
Glucose, Bld: 90 mg/dL (ref 70–99)
Potassium: 3.9 mmol/L (ref 3.5–5.1)
Sodium: 140 mmol/L (ref 135–145)
Total Bilirubin: 0.3 mg/dL (ref 0.3–1.2)
Total Protein: 6.8 g/dL (ref 6.5–8.1)

## 2022-11-13 LAB — CBC WITH DIFFERENTIAL/PLATELET
Abs Immature Granulocytes: 0.02 10*3/uL (ref 0.00–0.07)
Basophils Absolute: 0 10*3/uL (ref 0.0–0.1)
Basophils Relative: 0 %
Eosinophils Absolute: 0.3 10*3/uL (ref 0.0–0.5)
Eosinophils Relative: 3 %
HCT: 36.8 % (ref 36.0–46.0)
Hemoglobin: 11.5 g/dL — ABNORMAL LOW (ref 12.0–15.0)
Immature Granulocytes: 0 %
Lymphocytes Relative: 52 %
Lymphs Abs: 4.4 10*3/uL — ABNORMAL HIGH (ref 0.7–4.0)
MCH: 23.7 pg — ABNORMAL LOW (ref 26.0–34.0)
MCHC: 31.3 g/dL (ref 30.0–36.0)
MCV: 75.9 fL — ABNORMAL LOW (ref 80.0–100.0)
Monocytes Absolute: 0.5 10*3/uL (ref 0.1–1.0)
Monocytes Relative: 6 %
Neutro Abs: 3.4 10*3/uL (ref 1.7–7.7)
Neutrophils Relative %: 39 %
Platelets: 395 10*3/uL (ref 150–400)
RBC: 4.85 MIL/uL (ref 3.87–5.11)
RDW: 17.8 % — ABNORMAL HIGH (ref 11.5–15.5)
WBC: 8.6 10*3/uL (ref 4.0–10.5)
nRBC: 0 % (ref 0.0–0.2)

## 2022-11-13 LAB — HEMOGLOBIN A1C
Hgb A1c MFr Bld: 5.8 % — ABNORMAL HIGH (ref 4.8–5.6)
Mean Plasma Glucose: 119.76 mg/dL

## 2022-11-13 LAB — PHOSPHORUS: Phosphorus: 3.9 mg/dL (ref 2.5–4.6)

## 2022-11-13 LAB — MAGNESIUM: Magnesium: 2.3 mg/dL (ref 1.7–2.4)

## 2022-11-13 MED ORDER — SODIUM CHLORIDE 0.9 % IV BOLUS
1000.0000 mL | Freq: Once | INTRAVENOUS | Status: AC
  Administered 2022-11-13: 1000 mL via INTRAVENOUS

## 2022-11-13 NOTE — Progress Notes (Signed)
PROGRESS NOTE    [name]  [id number] DOB: 1995-07-10 DOA: 11/10/2022 PCP: Oneita Hurt, No   Brief Narrative:  HPI per [name] Note:   [name] is a 27 y.o. female with medical history significant of asthma, prediabetes with hemoglobin A1c 6.3 in 2022, celiac disease, cluster B personality disorder, panic disorder, collagen vascular disease, diarrhea, endometriosis with significant bowel involvement, SLE, ovarian cyst, PE January 2022, reported seizures, class III obesity with a BMI in the 40s.  Patient has had several admissions in the past 2 to 3 years at this facility due to abdominal pain with intractable nausea and vomiting and at times has had hematemesis.  She was last evaluated at our facility and left AMA on 09/29/2022.  3 days prior to that admission she had undergone an EGD with no significant findings noted.  Also during this previous admission GI was consulted.  It was suspected that given previous negative EGDs that her intermittent hematemesis had come from a possible Mallory-Weiss tear or esophagitis from repeated nausea and vomiting.  She had apparently not picked up her PPI prior to that most recent admission.  She has also had issues related to her endometriosis causing severe intractable constipation.  She was started on Linzess with consideration of transitioning to Movantik if intolerant.  She was also found to have chronic iron deficiency anemia with an iron level of 25.  Recommendation was for the patient to follow-up with GI after discharge but again she left AMA.   Patient presented to the ED today with report of abdominal pain and chest pain that started less than 24 hours prior to presentation.  She reports recurrent nausea and repeated emesis none witnessed since arrival.  She has subsequently undergone a CT of the abdomen which shows no acute finding but does show moderate stool burden.  Because of her chest pain and history of PE a CTA of the chest was  attempted but images were poor and recommendation was to attempt another CTA prior to discharge.  Patient has essentially been noncompliant with her anticoagulation since initially diagnosed.  She reported that during her previous hospitalization there was some discussion regarding possible discontinuation of anticoagulation.  Because she left AMA she opted not to take the medication.     Upon my evaluation of the patient she is continuing to for abdominal pain 10/10 and reports a recent episode of emesis prior to my entry into the room.  She is bright and cheerful.  She is very talkative and has discussed her multiple medical problems.  Her primary focus is on how her endometriosis is impacting her daily life in regards to pain and issues related to chronic constipation.  She states her lupus is well-controlled on her Plaquenil and she does have a rheumatologist in [place].  She states she attends online classes due to her chronic pain.  She reports that the best she felt with her endometriosis was when she was on Zoladex every 3 months.  Unfortunately she has not followed up with any GYN providers in regards to management of this medication.  The ER has asked the hospitalist to evaluate the patient for admission.  Given her presentation and still unclarified issues regarding possible PE she will be placed in a progressive care bed.  **Interim History  She continues to have some tachycardia and is extremely anxious her heart rates are slowly improving.  PE scan was repeated and showed no evidence of central PE.Marland Kitchen  Lower extremity venous  duplex negative.  D-dimer was slightly elevated but now resolved so stopped her anticoagulation.  Will give her another bolus given the tachycardia.Marland Kitchen  She will need further improvement in HR prior to D/C and she continues to be significantly constipated and has not had a bowel movement some days so we will give her a bowel regimen and given her suppository today given that she has  history of multiple ileus. Continues to have significant abdominal pain as well as nausea vomiting.  Pain is better controlled on the Dilaudid and is slowly improving though.  Assessment and Plan:  Recurrent abdominal pain, nausea, and vomiting in the context of chronic constipation and bowel involvement secondary to severe endometriosis -Currently no definitive findings other than severe constipation that includes fecalization of the terminal ileum which is likely contributing to patient's abdominal pain -Plan at this juncture is to attempt to improve constipation by the coadministration of laxatives and stool softeners.  Given 1 dose of milk of magnesia to get the process started but has not had a bowel movement so will try an enema -Patient reported Linzess in the past caused severe diarrhea so we will try Movantik as recommended by GI physician on previous admission -Patient was prediabetic with a hemoglobin A1c of 6.3 two years ago and if patient does have diabetes she could have a coexisting gastroparesis further complicating her GI issues and gastric motility -Will prescribe a regular diet -patient can adjust intake by drinking clear/full liquids without needing to consume a full regular tray.   -This will allow her to advance diet as tolerated.  She reports a history of gluten intolerance/celiac disease as well -Will give her another 1 L bolus and given that she has not had a bowel movement again we will add a bisacodyl suppository in addition to her Bowel Regimen and if this is not improving may need to start enemas versus bowel prep -Continues to have abdominal pain but is better controlled with the current regimen she is on but this is not resolved.  Also patient had some nausea vomiting again and will need to monitor closely   Chest Pain and history of PE noncompliant with anticoagulation -Troponin unremarkable and EKG nonischemic CTA done initially was nondiagnostic and recommendations  are to repeat -Will resume NOAC/Eliquis-will start with 10 mg BID for 7 days and then transition to 5 mg BID but given no PE will stop AC -TOC consultation-patient states she does have insurance with good prescriptive benefits -Checked D-dimer and was 0.74 as well as lower extremity venous duplex was negative    Tachycardia, slowly improving -Could be secondary to possible reemergence of PE although could be related to dehydration versus severe physical deconditioning. -Patient did state that several months ago she was severely depressed and essentially just laid in the bed and only got up to go to the bathroom and shower.  At that time she had severe anxiety and did not want to leave the house. -Troponin went from 3 -> 2 -Repeat CTA showed no evidence of PE -Give her another bolus 1 Liter today  Leukocytosis, improved  -Unclear Etiology and likely Reactive and now resolved on repeat -WBC Trend: Recent Labs  Lab 11/10/22 0442 11/11/22 0519 11/12/22 0447 11/12/22 1457 11/13/22 0539  WBC 7.2 10.0 13.9* 8.8 8.6  -Check Blood Cx x2 and CXR and KUB -CXR done and showed no Active Disease; KUB done and showed "No bowel dilatation or evidence of obstruction. Small to moderate volume of colonic stool. Pelvic  phleboliths." -Continue to monitor for signs or symptoms of infection Repeat CBC in a.m.   History of GERD and prior hematemesis and suspected prior Mallory-Weiss tear -Resume Protonix daily and add magnesium to prevent leg cramps -Magnesium will also help with constipation -Check fecal occult blood   Iron deficiency anemia with Microcytosis  -Iron level was 25 during previous admission -Given her severe constipation regular oral iron supplementation likely would be difficult -Hgb/Hct Trend: Recent Labs  Lab 11/10/22 0442 11/11/22 0519 11/12/22 0447 11/12/22 1457 11/13/22 0539  HGB 11.7* 12.0 11.8* 11.2* 11.5*  HCT 36.6 37.4 38.0 34.9* 36.8  MCV 75.0* 74.1* 77.6* 74.9* 75.9*   -Check anemia panel in a.m. -Continue to Monitor for S/Sx of Bleeding; No overt bleeding noted -Repeat CBC in the AM   Prediabetes -Repeat hemoglobin A1c was 5.8 -Patient is interested in starting Ozempic or another GLP-1 as this will assist with her weight loss cautioned her about this type of medication especially with her pre-existing bowel conditions -this medicine could be contraindicated for her and could potentially lead to a paralytic ileus.   -She does report she has had issues with ileus in the past secondary to bowel complications from her endometriosis -CBG Trend: No results for input(s): "GLUCAP" in the last 720 hours.   SLE/collagen vascular disorder -Continue Hydroxychloroquine 400 mg po Daily  -Patient needs to establish with a rheumatologist in the [facility] area if she continues to live here  Thrombocytosis -Patient's Platelet Count Trend Recent Labs  Lab 11/10/22 0442 11/11/22 0519 11/12/22 0447 11/12/22 1457 11/13/22 0539  PLT 403* 420* 409* 405* 395  -Continue to Monitor and Trend and repeat CBC in the AM    Severe Endometriosis/Chronic Pain -Patient reports significant improvement in symptoms after the administration of Zoladex.  Patient has not followed up with GYN locally.  -She must establish with a GYN if she is to be considered a candidate for this medication again. -Evaluated by GYN during previous hospitalization with recommendation to follow-up with Dr.Ajewole who specializes in complex endometriosis with severe bowel involvement -Will likely need to contact GYN here to help arrange outpatient follow-up -Patient not a candidate for NSAIDs due to history of hematemesis as well as coadministration of NOAC.   -Will use combination of Tylenol, low-dose oral narcotics and low-dose IV narcotics to control pain and rapidly de-escalate with plans to discharge off of narcotics.Changed Morphine to IV Hydromorphone given that she tolerates Dilaudid better;  Continues to have a lot of Pain but slowly improving so will continue current regimen -Patient may benefit from referral to pain clinic but she will need to determine/decide residency between [facility] and [place] before this can be accomplished. -Given the itchiness that she has with the hydromorphone will initiate IV Benadryl and continue 25 to 50 mg 6 as needed   Bipolar Disorder  Depression and Anxiety -Patient states previously prescribed Celexa ineffective and is requesting to resume Zoloft -Restarted Zoloft at 25 mg daily.  Patient aware that this medication needs to be titrated up and takes between 4 to 6 weeks to have any effect on her symptoms-she verbalized understanding -Reviewed previous psych notes and patient had been prescribed Xanax 1 mg twice daily as needed which will be resumed -Very Anxious discussed with her about getting a psych consult and she refused   Asthma -Really asymptomatic and apparently not on medications prior to admission SpO2: 99 % -Continue to Monitor Respiratory Status Carefully  Morbid Obesity -Complicates overall prognosis and care -Estimated body  mass index is 43.9 kg/m as calculated from the following:   Height as of this encounter: 5\' 2"  (1.575 m).   Weight as of this encounter: 108.9 kg.  -Weight Loss and Dietary Counseling given   DVT prophylaxis: enoxaparin (LOVENOX) injection 40 mg Start: 11/11/22 2200    Code Status: Full Code Family Communication: No family present at bedside  Disposition Plan:  Level of care: Progressive Status is: Inpatient Remains inpatient appropriate because: Further clinical improvement in her nausea vomiting as well as abdominal pain   Consultants:  None  Procedures:  As delineated as above  Antimicrobials:  Anti-infectives (From admission, onward)    Start     Dose/Rate Route Frequency Ordered Stop   11/10/22 2200  hydroxychloroquine (PLAQUENIL) tablet 400 mg        400 mg Oral Daily at bedtime  11/10/22 1433         Subjective: Seen and examined at bedside continues have significant abdominal pain had some nausea vomiting and vomited twice this morning.  Still has not had a bowel movement yet.  States that the pain regimen that she is currently helps but she continues to have quite significant pain.  No other concerns requested this time.  States that she had a GI bleed with NSAIDs previously.  No other concerns or complaints at this time.  Objective: Vitals:   11/12/22 2014 11/13/22 0131 11/13/22 0514 11/13/22 1257  BP: (!) 145/98 (!) 146/91 (!) 154/105 (!) 162/115  Pulse: (!) 103 97 86 92  Resp: 18 18 18 20   Temp: 98.3 F (36.8 C) 97.9 F (36.6 C) 98.2 F (36.8 C) 98 F (36.7 C)  TempSrc: Oral Oral Oral Oral  SpO2: 99% 100% 100% 99%  Weight:      Height:        Intake/Output Summary (Last 24 hours) at 11/13/2022 1807 Last data filed at 11/12/2022 1835 Gross per 24 hour  Intake 360 ml  Output --  Net 360 ml   Filed Weights   11/10/22 0439  Weight: 108.9 kg   Examination: Physical Exam:  Constitutional: WN/WD morbidly obese female who appears slightly uncomfortable Respiratory: Diminished to auscultation bilaterally, no wheezing, rales, rhonchi or crackles. Normal respiratory effort and patient is not tachypenic. No accessory muscle use.  Cardiovascular: Tachycardic rate but regular rhythm, no murmurs / rubs / gallops. S1 and S2 auscultated. No extremity edema.   Abdomen: Soft, tender to palpate and distended secondary to body habitus. Bowel sounds positive.  GU: Deferred. Musculoskeletal: No clubbing / cyanosis of digits/nails. No joint deformity upper and lower extremities. Skin: No rashes, lesions, ulcers on limited skin evaluation. No induration; Warm and dry.  Neurologic: CN 2-12 grossly intact with no focal deficits. Romberg sign cerebellar reflexes not assessed.  Psychiatric: Normal judgment and insight. Alert and oriented x 3.  Anxious mood and  appropriate affect.   Data Reviewed: I have personally reviewed following labs and imaging studies  CBC: Recent Labs  Lab 11/10/22 0442 11/11/22 0519 11/12/22 0447 11/12/22 1457 11/13/22 0539  WBC 7.2 10.0 13.9* 8.8 8.6  NEUTROABS  --   --  6.8 3.6 3.4  HGB 11.7* 12.0 11.8* 11.2* 11.5*  HCT 36.6 37.4 38.0 34.9* 36.8  MCV 75.0* 74.1* 77.6* 74.9* 75.9*  PLT 403* 420* 409* 405* 395   Basic Metabolic Panel: Recent Labs  Lab 11/10/22 0442 11/11/22 0519 11/12/22 0447 11/13/22 0539  NA 141 139 137 140  K 3.5 4.3 3.7 3.9  CL 109 108  105 106  CO2 22 22 21* 23  GLUCOSE 107* 142* 124* 90  BUN 8 6 12 9   CREATININE 0.82 0.70 0.84 0.67  CALCIUM 9.4 9.5 8.6* 9.1  MG  --  2.1 2.2 2.3  PHOS  --  2.7 3.9 3.9   GFR: Estimated Creatinine Clearance: 120.6 mL/min (by C-G formula based on SCr of 0.67 mg/dL). Liver Function Tests: Recent Labs  Lab 11/10/22 0455 11/11/22 0519 11/12/22 0447 11/13/22 0539  AST 14* 19 13* 14*  ALT 12 14 13 13   ALKPHOS 73 70 70 64  BILITOT 0.5 0.5 0.3 0.3  PROT 7.2 7.7 6.5 6.8  ALBUMIN 4.1 3.9 3.8 3.8   Recent Labs  Lab 11/10/22 0442  LIPASE 30   No results for input(s): "AMMONIA" in the last 168 hours. Coagulation Profile: No results for input(s): "INR", "PROTIME" in the last 168 hours. Cardiac Enzymes: No results for input(s): "CKTOTAL", "CKMB", "CKMBINDEX", "TROPONINI" in the last 168 hours. BNP (last 3 results) No results for input(s): "PROBNP" in the last 8760 hours. HbA1C: Recent Labs    11/12/22 0447 11/13/22 0539  HGBA1C 5.8* 5.8*   CBG: No results for input(s): "GLUCAP" in the last 168 hours. Lipid Profile: No results for input(s): "CHOL", "HDL", "LDLCALC", "TRIG", "CHOLHDL", "LDLDIRECT" in the last 72 hours. Thyroid Function Tests: No results for input(s): "TSH", "T4TOTAL", "FREET4", "T3FREE", "THYROIDAB" in the last 72 hours. Anemia Panel: No results for input(s): "VITAMINB12", "FOLATE", "FERRITIN", "TIBC", "IRON",  "RETICCTPCT" in the last 72 hours. Sepsis Labs: No results for input(s): "PROCALCITON", "LATICACIDVEN" in the last 168 hours.  Recent Results (from the past 240 hour(s))  Culture, blood (Routine X 2) w Reflex to ID Panel     Status: None (Preliminary result)   Collection Time: 11/12/22  6:03 PM   Specimen: BLOOD  Result Value Ref Range Status   Specimen Description   Final    BLOOD BLOOD LEFT HAND Performed at [facility], [address], [place], [place] [address]    Special Requests   Final    BOTTLES DRAWN AEROBIC AND ANAEROBIC Blood Culture adequate volume Performed at [facility], [address], [place], [place] [address]    Culture   Final    NO GROWTH < 12 HOURS Performed at [facility], [address] [place], [place] [address]    Report Status PENDING  Incomplete  Culture, blood (Routine X 2) w Reflex to ID Panel     Status: None (Preliminary result)   Collection Time: 11/12/22  6:07 PM   Specimen: BLOOD  Result Value Ref Range Status   Specimen Description   Final    BLOOD BLOOD LEFT HAND Performed at [facility], [address], [place], [place] [address]    Special Requests   Final    BOTTLES DRAWN AEROBIC AND ANAEROBIC Blood Culture adequate volume Performed at [facility], [address], [place], [place] [address]    Culture   Final    NO GROWTH < 12 HOURS Performed at [facility], [address] [place], [place] [address]    Report Status PENDING  Incomplete    Radiology Studies: DG Abd 1 View  Result Date: 11/12/2022 CLINICAL DATA:  Abdominal distension.  Nausea and vomiting. EXAM: ABDOMEN - 1 VIEW COMPARISON:  CT 11/10/2022 FINDINGS: No bowel dilatation or evidence of obstruction. Small to moderate volume of colonic stool. Pelvic phleboliths. IMPRESSION: Nonobstructive bowel gas pattern.  Small to moderate colonic stool burden. Electronically Signed   By:  Narda Rutherford M.D.   On: 11/12/2022 16:17   DG CHEST PORT 1 VIEW  Result Date: 11/12/2022 CLINICAL DATA:  Shortness of breath. EXAM: PORTABLE CHEST 1 VIEW COMPARISON:  Radiograph 11/10/2022, CT 11/11/2022 FINDINGS: The cardiomediastinal contours are normal. The lungs are clear. Pulmonary vasculature is normal. No consolidation, pleural effusion, or pneumothorax. No acute osseous abnormalities are seen. IMPRESSION: No active disease. Electronically Signed   By: Narda Rutherford M.D.   On: 11/12/2022 16:16    Scheduled Meds:  enoxaparin (LOVENOX) injection  40 mg Subcutaneous Q24H   hydroxychloroquine  400 mg Oral QHS   naloxegol oxalate  12.5 mg Oral Daily   norethindrone  10 mg Oral QPM   ondansetron (ZOFRAN) IV  4 mg Intravenous Q6H   pantoprazole  40 mg Oral Daily   polyethylene glycol  17 g Oral BID   senna-docusate  1 tablet Oral BID   sertraline  25 mg Oral Daily   sodium chloride flush  3 mL Intravenous Q12H   Continuous Infusions:  sodium chloride      LOS: 2 days   Marguerita Merles, DO [facility] Available via Epic secure chat 7am-7pm After these hours, please refer to coverage provider listed on [email or web address] 11/13/2022, 6:07 PM

## 2022-11-13 NOTE — Plan of Care (Signed)
Problem: Education: Goal: Knowledge of General Education information will improve Description Including pain rating scale, medication(s)/side effects and non-pharmacologic comfort measures Outcome: Progressing   Problem: Health Behavior/Discharge Planning: Goal: Ability to manage health-related needs will improve Outcome: Progressing   Problem: Clinical Measurements: Goal: Respiratory complications will improve Outcome: Progressing   Problem: Nutrition: Goal: Adequate nutrition will be maintained Outcome: Progressing   Problem: Elimination: Goal: Will not experience complications related to urinary retention Outcome: Progressing

## 2022-11-13 NOTE — Plan of Care (Signed)

## 2022-11-14 DIAGNOSIS — R109 Unspecified abdominal pain: Secondary | ICD-10-CM | POA: Insufficient documentation

## 2022-11-14 DIAGNOSIS — N809 Endometriosis, unspecified: Secondary | ICD-10-CM

## 2022-11-14 DIAGNOSIS — K5900 Constipation, unspecified: Secondary | ICD-10-CM | POA: Diagnosis not present

## 2022-11-14 DIAGNOSIS — R1084 Generalized abdominal pain: Secondary | ICD-10-CM | POA: Diagnosis not present

## 2022-11-14 DIAGNOSIS — R112 Nausea with vomiting, unspecified: Secondary | ICD-10-CM | POA: Diagnosis not present

## 2022-11-14 LAB — CBC WITH DIFFERENTIAL/PLATELET
Abs Immature Granulocytes: 0.01 10*3/uL (ref 0.00–0.07)
Basophils Absolute: 0 10*3/uL (ref 0.0–0.1)
Basophils Relative: 0 %
Eosinophils Absolute: 0.3 10*3/uL (ref 0.0–0.5)
Eosinophils Relative: 4 %
HCT: 38.2 % (ref 36.0–46.0)
Hemoglobin: 11.8 g/dL — ABNORMAL LOW (ref 12.0–15.0)
Immature Granulocytes: 0 %
Lymphocytes Relative: 45 %
Lymphs Abs: 3.6 10*3/uL (ref 0.7–4.0)
MCH: 23.9 pg — ABNORMAL LOW (ref 26.0–34.0)
MCHC: 30.9 g/dL (ref 30.0–36.0)
MCV: 77.3 fL — ABNORMAL LOW (ref 80.0–100.0)
Monocytes Absolute: 0.5 10*3/uL (ref 0.1–1.0)
Monocytes Relative: 7 %
Neutro Abs: 3.5 10*3/uL (ref 1.7–7.7)
Neutrophils Relative %: 44 %
Platelets: 385 10*3/uL (ref 150–400)
RBC: 4.94 MIL/uL (ref 3.87–5.11)
RDW: 17.7 % — ABNORMAL HIGH (ref 11.5–15.5)
WBC: 7.9 10*3/uL (ref 4.0–10.5)
nRBC: 0 % (ref 0.0–0.2)

## 2022-11-14 LAB — COMPREHENSIVE METABOLIC PANEL
ALT: 18 U/L (ref 0–44)
AST: 22 U/L (ref 15–41)
Albumin: 3.8 g/dL (ref 3.5–5.0)
Alkaline Phosphatase: 69 U/L (ref 38–126)
Anion gap: 9 (ref 5–15)
BUN: 10 mg/dL (ref 6–20)
CO2: 24 mmol/L (ref 22–32)
Calcium: 9 mg/dL (ref 8.9–10.3)
Chloride: 104 mmol/L (ref 98–111)
Creatinine, Ser: 0.79 mg/dL (ref 0.44–1.00)
GFR, Estimated: >60 mL/min (ref >60)
Glucose, Bld: 105 mg/dL — ABNORMAL HIGH (ref 70–99)
Potassium: 3.9 mmol/L (ref 3.5–5.1)
Sodium: 137 mmol/L (ref 135–145)
Total Bilirubin: 0.2 mg/dL — ABNORMAL LOW (ref 0.3–1.2)
Total Protein: 6.6 g/dL (ref 6.5–8.1)

## 2022-11-14 LAB — MAGNESIUM: Magnesium: 2.2 mg/dL (ref 1.7–2.4)

## 2022-11-14 LAB — PHOSPHORUS: Phosphorus: 4 mg/dL (ref 2.5–4.6)

## 2022-11-14 MED ORDER — SODIUM CHLORIDE 0.9 % IV SOLN
25.0000 mg | Freq: Four times a day (QID) | INTRAVENOUS | Status: DC | PRN
  Filled 2022-11-14: qty 1

## 2022-11-14 MED ORDER — HYDROMORPHONE HCL 1 MG/ML IJ SOLN
1.0000 mg | INTRAMUSCULAR | Status: DC | PRN
  Administered 2022-11-14 – 2022-11-16 (×11): 1 mg via INTRAVENOUS
  Filled 2022-11-14 (×12): qty 1

## 2022-11-14 MED ORDER — MAGNESIUM CITRATE PO SOLN
1.0000 | Freq: Once | ORAL | Status: AC
  Administered 2022-11-14: 1 via ORAL
  Filled 2022-11-14: qty 296

## 2022-11-14 MED ORDER — DICYCLOMINE HCL 20 MG PO TABS
20.0000 mg | ORAL_TABLET | Freq: Three times a day (TID) | ORAL | Status: DC
Start: 2022-11-14 — End: 2022-11-16
  Administered 2022-11-14 – 2022-11-16 (×9): 20 mg via ORAL
  Filled 2022-11-14 (×10): qty 1

## 2022-11-14 MED ORDER — SORBITOL 70 % SOLN
30.0000 mL | Freq: Every day | Status: DC
  Administered 2022-11-14 – 2022-11-16 (×3): 30 mL via ORAL
  Filled 2022-11-14 (×3): qty 30

## 2022-11-14 NOTE — TOC Initial Note (Signed)
Transition of Care [facility]) - Initial/Assessment Note    Patient Details  Name: [name] MRN: [id number] Date of Birth: Oct 31, 1995  Transition of Care [facility]) CM/SW Contact:    Lanier Clam, RN Phone Number: 11/14/2022, 2:38 PM  Clinical Narrative:Patient has PCP, pharmacy, she takes her meds as ordered & will call uber @ d/c.                    Expected Discharge Plan: Home/Self Care Barriers to Discharge: Continued Medical Work up   Patient Goals and CMS Choice Patient states their goals for this hospitalization and ongoing recovery are:: Home CMS Medicare.gov Compare Post Acute Care list provided to:: Patient Choice offered to / list presented to : Patient [place] ownership interest in [facility].provided to:: Patient    Expected Discharge Plan and Services   Discharge Planning Services: CM Consult   Living arrangements for the past 2 months: Single Family Home                                      Prior Living Arrangements/Services Living arrangements for the past 2 months: Single Family Home Lives with:: Parents Patient language and need for interpreter reviewed:: Yes Do you feel safe going back to the place where you live?: Yes      Need for Family Participation in Patient Care: Yes (Comment) Care giver support system in place?: Yes (comment)   Criminal Activity/Legal Involvement Pertinent to Current Situation/Hospitalization: No - Comment as needed  Activities of Daily Living   ADL Screening (condition at time of admission) Independently performs ADLs?: Yes (appropriate for developmental age) Is the patient deaf or have difficulty hearing?: No Does the patient have difficulty seeing, even when wearing glasses/contacts?: No Does the patient have difficulty concentrating, remembering, or making decisions?: No  Permission Sought/Granted Permission sought to share information with : Case Manager Permission granted to share information  with : Yes, Verbal Permission Granted  Share Information with NAME: Case Manager           Emotional Assessment Appearance:: Appears stated age Attitude/Demeanor/Rapport: Gracious Affect (typically observed): Accepting Orientation: : Oriented to Self, Oriented to Place, Oriented to  Time, Oriented to Situation Alcohol / Substance Use: Not Applicable Psych Involvement: No (comment)  Admission diagnosis:  Chronic abdominal pain [R10.9, G89.29] Intractable nausea and vomiting [R11.2] Patient Active Problem List   Diagnosis Date Noted   Microcytic anemia 09/28/2022   Iron deficiency 09/28/2022   Hypocalcemia 09/27/2022   Hypertension 09/27/2022   Elevated troponin 09/27/2022   Generalized abdominal pain 09/27/2022   Chronic bilateral lower abdominal pain 09/27/2022   Abdominal pain, epigastric 09/24/2022   Nausea and vomiting 09/24/2022   History of gastritis 09/23/2022   Concern for Mallory-Weiss tear 09/23/2022   Intractable nausea and vomiting 09/23/2022   Metabolic acidosis 09/23/2022   Hematemesis with nausea 09/23/2022   GI bleeding 07/28/2022   Contusion of left hand 06/21/2022   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   Bipolar II disorder, most recent episode major depressive (HCC) 08/25/2021   Altered mental status    Overdose 08/23/2021   Intentional acetaminophen overdose (HCC)    Xanax overdose    Tachycardia 08/05/2020   Anxiety    Hematemesis 06/17/2020   MDD (major depressive disorder), recurrent severe, 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   Panic disorder 05/30/2020   MDD (major depressive disorder) 05/29/2020  Suicide attempt by acetaminophen overdose (HCC) 05/29/2020   Obesity, Class III, BMI 40-49.9 (morbid obesity) (HCC) 04/05/2020   SOB (shortness of breath) 04/05/2020   Intractable abdominal pain 04/04/2020   SLE (systemic lupus erythematosus related syndrome) (HCC) 04/04/2020   Shortness of breath 04/04/2020   Pneumonia 03/23/2020    Nausea 03/23/2020   Asthma 03/22/2020   Dysuria 02/03/2020   Hematuria 02/03/2020   Mass of urinary bladder 01/26/2020   Allergic rhinitis 05/05/2019   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   Endometriosis 03/14/2018   Long-term use of high-risk medication 07/02/2017   Lupus anticoagulant positive 07/02/2017   History of pulmonary embolus (PE) 07/02/2017   SLE (systemic lupus erythematosus) (HCC) 03/12/2017   Chronic anemia 02/28/2017   Migraine 02/28/2017   Dysmenorrhea 08/28/2011   Gastritis 08/28/2011   PCP:  Pcp, No Pharmacy:   [id number] - [place], [place] - [address] AT [facility] OF [address] [place] [address] Phone: [phone] Fax: [phone]  [id number] [place], [place] - [address] AT [facility] OF [facility] & [address] [place] [address] Phone: [phone] Fax: [phone]  [facility][id number] [place], [place] - [address] AT [facility] OF [facility] [address] & [facility] [address] [place] [address] Phone: [phone] Fax: [phone]     Social Determinants of Health (SDOH) Social History: SDOH Screenings   Food Insecurity: No Food Insecurity (11/10/2022)  Housing: Low Risk  (11/10/2022)  Transportation Needs: No Transportation Needs (11/10/2022)  Utilities: Not At Risk (11/10/2022)  Alcohol Screen: Low Risk  (05/30/2020)  Depression (PHQ2-9): Medium Risk (12/28/2021)  Social Connections: Low Risk  (02/03/2022)   Received from [employer]  Tobacco Use: Low Risk  (11/11/2022)   SDOH Interventions:     Readmission Risk Interventions    09/25/2022    9:57 AM 08/25/2021   10:42 AM  Readmission Risk Prevention Plan  Transportation Screening Complete Complete  PCP or Specialist Appt within 5-7 Days Complete   Home Care Screening Complete   Medication Review (RN CM) Referral to  Pharmacy   Medication Review (RN Care Manager)  Complete  PCP or Specialist appointment within 3-5 days of discharge  Complete  HRI or Home Care Consult  Complete  SW Recovery Care/Counseling Consult  Complete  Palliative Care Screening  Not Applicable  Skilled Nursing Facility  Not Applicable

## 2022-11-14 NOTE — Progress Notes (Signed)
Progress Note    [name]   WJX:[id number]  DOB: 18-Apr-1995  DOA: 11/10/2022     3 PCP: Pcp, No  Initial CC: abd pain  Hospital Course: [name] is a 27 y.o. female with PMH asthma, prediabetes, celiac, cluster B personality disorder, panic disorder, collagen vascular disease, endometriosis, lupus, ovarian cyst, PE, reported seizures, obesity.  She presented with ongoing abdominal pain and nausea/vomiting.  Multiple hospitalizations in the past for similar. Some issues with constipation as well contributing to symptoms in the past.  She was admitted for pain and nausea control with diet advancement as tolerated.  Assessment and Plan:  Abdominal pain Endometriosis Constipation N/V -CT abdomen/pelvis on admission negative for acute findings in the abdomen or pelvis.  Moderate stool burden appreciated involving terminal ileum; this may be contributing to her overall symptoms along with underlying endometriosis -Continuing on pain regimen and nausea regimen; adjusted further today - Continue current diet - continue laxative regimen; adjusted further today - trial of bentyl also    Chest Pain and history of PE noncompliant with anticoagulation -Troponin unremarkable and EKG nonischemic CTA done initially was nondiagnostic and recommendations are to repeat -Checked D-dimer and was 0.74 and repeat check negative; LE duplex negative for DVT also - anticoagulation stopped   Tachycardia -Suspect multifactorial.  Some contribution from underlying anxiety but also some probable volume depletion from poor intake  Leukocytosis -resolved -Suspected reactive and now resolved   History of GERD and prior hematemesis and suspected prior Mallory-Weiss tear -Resume Protonix daily and add magnesium to prevent leg cramps -Magnesium will also help with constipation - Hgb stable   Iron deficiency anemia with Microcytosis  - outpatient iron labs - iron supplement would  increase risk of constipation further also    Prediabetes -Repeat hemoglobin A1c was 5.8 -Patient is interested in starting Ozempic or another GLP-1 as this will assist with her weight loss cautioned her about this type of medication especially with her pre-existing bowel conditions -this medicine could be contraindicated for her and could potentially lead to a paralytic ileus.   -She does report she has had issues with ileus in the past secondary to bowel complications from her endometriosis   SLE/collagen vascular disorder -Continue Hydroxychloroquine 400 mg po Daily  -Patient needs to establish with a rheumatologist in the [facility] area if she continues to live here  Thrombocytosis -Considered reactive and has also normalized   Endometriosis with acute on chronic Pain -Patient reports significant improvement in symptoms after the administration of Zoladex.  Patient has not followed up with GYN locally.   -She must establish with a GYN if she is to be considered a candidate for this medication again. -Evaluated by GYN during previous hospitalization with recommendation to follow-up with Dr.Ajewole who specializes in complex endometriosis with severe bowel involvement -Patient not a candidate for NSAIDs due to history of hematemesis as well as coadministration of NOAC   Bipolar Disorder  Depression and Anxiety -Patient states previously prescribed Celexa ineffective and is requesting to resume Zoloft -Restarted Zoloft at 25 mg daily.  Patient aware that this medication needs to be titrated up and takes between 4 to 6 weeks to have any effect on her symptoms-she verbalized understanding -Reviewed previous psych notes and patient had been prescribed Xanax 1 mg twice daily as needed which will be resumed -Very Anxious discussed with her about getting a psych consult and she refused   Asthma -asymptomatic and apparently not on medications prior to admission  Morbid Obesity -Complicates  overall prognosis and care -Estimated body mass index is 43.9 kg/m as calculated from the following:   Height as of this encounter: 5\' 2"  (1.575 m).   Weight as of this encounter: 108.9 kg.  -Weight Loss and Dietary Counseling given   Interval History:  No events overnight.  She mostly states that she still does not feel well today.  Still having ongoing abdominal pain and not quite tolerating diet as well as anticipated.  Also still having ongoing nausea and Zofran wears off too quickly.  Old records reviewed in assessment of this patient  Antimicrobials:   DVT prophylaxis:  enoxaparin (LOVENOX) injection 40 mg Start: 11/11/22 2200   Code Status:   Code Status: Full Code  Mobility Assessment (Last 72 Hours)     Mobility Assessment     Row Name 11/14/22 0749 11/13/22 1950 11/13/22 1101 11/12/22 2030 11/12/22 0758   Does patient have an order for bedrest or is patient medically unstable No - Continue assessment No - Continue assessment No - Continue assessment No - Continue assessment No - Continue assessment   What is the highest level of mobility based on the progressive mobility assessment? Level 6 (Walks independently in room and hall) - Balance while walking in room without assist - Complete Level 6 (Walks independently in room and hall) - Balance while walking in room without assist - Complete Level 6 (Walks independently in room and hall) - Balance while walking in room without assist - Complete Level 6 (Walks independently in room and hall) - Balance while walking in room without assist - Complete Level 6 (Walks independently in room and hall) - Balance while walking in room without assist - Complete    Row Name 11/11/22 1955           Does patient have an order for bedrest or is patient medically unstable No - Continue assessment       What is the highest level of mobility based on the progressive mobility assessment? Level 6 (Walks independently in room and hall) -  Balance while walking in room without assist - Complete                Barriers to discharge: none Disposition Plan:  Home Status is: Inpt  Objective: Blood pressure (!) 146/80, pulse 97, temperature 98 F (36.7 C), temperature source Oral, resp. rate 17, height 5\' 2"  (1.575 m), weight 108.9 kg, SpO2 98%.  Examination:  Physical Exam Constitutional:      Appearance: Normal appearance. She is well-developed. She is obese.  HENT:     Head: Normocephalic and atraumatic.     Mouth/Throat:     Mouth: Mucous membranes are moist.  Eyes:     Extraocular Movements: Extraocular movements intact.  Cardiovascular:     Rate and Rhythm: Normal rate and regular rhythm.  Pulmonary:     Effort: Pulmonary effort is normal. No respiratory distress.     Breath sounds: Normal breath sounds. No wheezing.  Abdominal:     General: Bowel sounds are normal. There is no distension.     Palpations: Abdomen is soft.     Tenderness: There is abdominal tenderness (generalized).  Musculoskeletal:        General: Normal range of motion.     Cervical back: Normal range of motion and neck supple.  Skin:    General: Skin is warm and dry.  Neurological:     General: No focal deficit present.  Mental Status: She is alert.  Psychiatric:        Mood and Affect: Mood normal.      Consultants:    Procedures:    Data Reviewed: Results for orders placed or performed during the hospital encounter of 11/10/22 (from the past 24 hour(s))  CBC with Differential/Platelet     Status: Abnormal   Collection Time: 11/14/22  5:13 AM  Result Value Ref Range   WBC 7.9 4.0 - 10.5 K/uL   RBC 4.94 3.87 - 5.11 MIL/uL   Hemoglobin 11.8 (L) 12.0 - 15.0 g/dL   HCT 34.7 42.5 - 95.6 %   MCV 77.3 (L) 80.0 - 100.0 fL   MCH 23.9 (L) 26.0 - 34.0 pg   MCHC 30.9 30.0 - 36.0 g/dL   RDW 38.7 (H) 56.4 - 33.2 %   Platelets 385 150 - 400 K/uL   nRBC 0.0 0.0 - 0.2 %   Neutrophils Relative % 44 %   Neutro Abs 3.5 1.7 - 7.7  K/uL   Lymphocytes Relative 45 %   Lymphs Abs 3.6 0.7 - 4.0 K/uL   Monocytes Relative 7 %   Monocytes Absolute 0.5 0.1 - 1.0 K/uL   Eosinophils Relative 4 %   Eosinophils Absolute 0.3 0.0 - 0.5 K/uL   Basophils Relative 0 %   Basophils Absolute 0.0 0.0 - 0.1 K/uL   Immature Granulocytes 0 %   Abs Immature Granulocytes 0.01 0.00 - 0.07 K/uL  Comprehensive metabolic panel     Status: Abnormal   Collection Time: 11/14/22  5:13 AM  Result Value Ref Range   Sodium 137 135 - 145 mmol/L   Potassium 3.9 3.5 - 5.1 mmol/L   Chloride 104 98 - 111 mmol/L   CO2 24 22 - 32 mmol/L   Glucose, Bld 105 (H) 70 - 99 mg/dL   BUN 10 6 - 20 mg/dL   Creatinine, Ser 9.51 0.44 - 1.00 mg/dL   Calcium 9.0 8.9 - 88.4 mg/dL   Total Protein 6.6 6.5 - 8.1 g/dL   Albumin 3.8 3.5 - 5.0 g/dL   AST 22 15 - 41 U/L   ALT 18 0 - 44 U/L   Alkaline Phosphatase 69 38 - 126 U/L   Total Bilirubin 0.2 (L) 0.3 - 1.2 mg/dL   GFR, Estimated >16 >60 mL/min   Anion gap 9 5 - 15  Phosphorus     Status: None   Collection Time: 11/14/22  5:13 AM  Result Value Ref Range   Phosphorus 4.0 2.5 - 4.6 mg/dL  Magnesium     Status: None   Collection Time: 11/14/22  5:13 AM  Result Value Ref Range   Magnesium 2.2 1.7 - 2.4 mg/dL    I have reviewed pertinent nursing notes, vitals, labs, and images as necessary. I have ordered labwork to follow up on as indicated.  I have reviewed the last notes from staff over past 24 hours. I have discussed patient's care plan and test results with nursing staff, CM/SW, and other staff as appropriate.  Time spent: Greater than 50% of the 55 minute visit was spent in counseling/coordination of care for the patient as laid out in the A&P.   LOS: 3 days   Lewie Chamber, MD [facility] 11/14/2022, 6:35 PM

## 2022-11-15 DIAGNOSIS — R112 Nausea with vomiting, unspecified: Secondary | ICD-10-CM | POA: Diagnosis not present

## 2022-11-15 DIAGNOSIS — N809 Endometriosis, unspecified: Secondary | ICD-10-CM | POA: Diagnosis not present

## 2022-11-15 DIAGNOSIS — R1084 Generalized abdominal pain: Secondary | ICD-10-CM | POA: Diagnosis not present

## 2022-11-15 NOTE — Plan of Care (Signed)

## 2022-11-15 NOTE — Progress Notes (Signed)
Progress Note    Gertrue Schoonmaker   NGE:[id number]  DOB: 12/05/1995  DOA: 11/10/2022     4 PCP: Pcp, No  Initial CC: abd pain  Hospital Course: Astin Waggoner is a 27 y.o. female with PMH asthma, prediabetes, celiac, cluster B personality disorder, panic disorder, collagen vascular disease, endometriosis, lupus, ovarian cyst, PE, reported seizures, obesity.  She presented with ongoing abdominal pain and nausea/vomiting.  Multiple hospitalizations in the past for similar. Some issues with constipation as well contributing to symptoms in the past.  She was admitted for pain and nausea control with diet advancement as tolerated.  Assessment and Plan:  Abdominal pain Endometriosis Constipation N/V -CT abdomen/pelvis on admission negative for acute findings in the abdomen or pelvis.  Moderate stool burden appreciated involving terminal ileum; this may be contributing to her overall symptoms along with underlying endometriosis -Continuing on pain regimen and nausea regimen - Continue current diet - continue laxative regimen - trial of bentyl also    Chest Pain and history of PE noncompliant with anticoagulation -Troponin unremarkable and EKG nonischemic CTA done initially was nondiagnostic and recommendations are to repeat -Checked D-dimer and was 0.74 and repeat check negative; LE duplex negative for DVT also - anticoagulation stopped   Tachycardia -Suspect multifactorial.  Some contribution from underlying anxiety but also some probable volume depletion from poor intake and/or even some pain relation  Leukocytosis -resolved -Suspected reactive and now resolved   History of GERD and prior hematemesis and suspected prior Mallory-Weiss tear -Resume Protonix daily and add magnesium to prevent leg cramps -Magnesium will also help with constipation - Hgb stable   Iron deficiency anemia with Microcytosis  - outpatient iron labs - iron supplement would increase risk of  constipation further also    Prediabetes -Repeat hemoglobin A1c was 5.8 -Patient is interested in starting Ozempic or another GLP-1 as this will assist with her weight loss cautioned her about this type of medication especially with her pre-existing bowel conditions -this medicine could be contraindicated for her and could potentially lead to a paralytic ileus.   -She does report she has had issues with ileus in the past secondary to bowel complications from her endometriosis   SLE/collagen vascular disorder -Continue Hydroxychloroquine 400 mg po Daily  -Patient needs to establish with a rheumatologist in the [facility] area if she continues to live here  Thrombocytosis -Considered reactive and has also normalized   Endometriosis with acute on chronic Pain -Patient reports significant improvement in symptoms after the administration of Zoladex.  Patient has not followed up with GYN locally.   -She must establish with a GYN if she is to be considered a candidate for this medication again. -Evaluated by GYN during previous hospitalization with recommendation to follow-up with Dr.Ajewole who specializes in complex endometriosis with severe bowel involvement -Patient not a candidate for NSAIDs due to history of hematemesis as well as coadministration of NOAC   Bipolar Disorder  Depression and Anxiety -Patient states previously prescribed Celexa ineffective and is requesting to resume Zoloft -Restarted Zoloft at 25 mg daily.  Patient aware that this medication needs to be titrated up and takes between 4 to 6 weeks to have any effect on her symptoms-she verbalized understanding -Reviewed previous psych notes and patient had been prescribed Xanax 1 mg twice daily as needed which will be resumed -Very Anxious discussed with her about getting a psych consult and she refused   Asthma -asymptomatic and apparently not on medications prior to admission  Morbid  Obesity -Complicates overall  prognosis and care -Estimated body mass index is 43.9 kg/m as calculated from the following:   Height as of this encounter: 5\' 2"  (1.575 m).   Weight as of this encounter: 108.9 kg.  -Weight Loss and Dietary Counseling given   Interval History:  No events overnight.  Difficulty elaborating today but mostly states she just doesn't feel good. Also has a new HA today. Did eat b'fast, plate was cleaned. Still didn't feel well enough to go home today.  Updated mother on phone while in room this morning too.   Old records reviewed in assessment of this patient  Antimicrobials:   DVT prophylaxis:  enoxaparin (LOVENOX) injection 40 mg Start: 11/11/22 2200   Code Status:   Code Status: Full Code  Mobility Assessment (Last 72 Hours)     Mobility Assessment     Row Name 11/15/22 0845 11/14/22 2010 11/14/22 0749 11/13/22 1950 11/13/22 1101   Does patient have an order for bedrest or is patient medically unstable No - Continue assessment No - Continue assessment No - Continue assessment No - Continue assessment No - Continue assessment   What is the highest level of mobility based on the progressive mobility assessment? Level 6 (Walks independently in room and hall) - Balance while walking in room without assist - Complete Level 6 (Walks independently in room and hall) - Balance while walking in room without assist - Complete Level 6 (Walks independently in room and hall) - Balance while walking in room without assist - Complete Level 6 (Walks independently in room and hall) - Balance while walking in room without assist - Complete Level 6 (Walks independently in room and hall) - Balance while walking in room without assist - Complete    Row Name 11/12/22 2030           Does patient have an order for bedrest or is patient medically unstable No - Continue assessment       What is the highest level of mobility based on the progressive mobility assessment? Level 6 (Walks independently in room  and hall) - Balance while walking in room without assist - Complete                Barriers to discharge: none Disposition Plan:  Home Status is: Inpt  Objective: Blood pressure (!) 145/84, pulse (!) 113, temperature 98.3 F (36.8 C), temperature source Oral, resp. rate 16, height 5\' 2"  (1.575 m), weight 108.9 kg, SpO2 96%.  Examination:  Physical Exam Constitutional:      Appearance: Normal appearance. She is well-developed. She is obese.  HENT:     Head: Normocephalic and atraumatic.     Mouth/Throat:     Mouth: Mucous membranes are moist.  Eyes:     Extraocular Movements: Extraocular movements intact.  Cardiovascular:     Rate and Rhythm: Normal rate and regular rhythm.  Pulmonary:     Effort: Pulmonary effort is normal. No respiratory distress.     Breath sounds: Normal breath sounds. No wheezing.  Abdominal:     General: Bowel sounds are normal. There is no distension.     Palpations: Abdomen is soft.     Tenderness: There is abdominal tenderness (generalized).  Musculoskeletal:        General: Normal range of motion.     Cervical back: Normal range of motion and neck supple.  Skin:    General: Skin is warm and dry.  Neurological:     General: No  focal deficit present.     Mental Status: She is alert.  Psychiatric:        Mood and Affect: Mood normal.      Consultants:    Procedures:    Data Reviewed: No results found for this or any previous visit (from the past 24 hour(s)).   I have reviewed pertinent nursing notes, vitals, labs, and images as necessary. I have ordered labwork to follow up on as indicated.  I have reviewed the last notes from staff over past 24 hours. I have discussed patient's care plan and test results with nursing staff, CM/SW, and other staff as appropriate.  Time spent: Greater than 50% of the 55 minute visit was spent in counseling/coordination of care for the patient as laid out in the A&P.   LOS: 4 days   Lewie Chamber,  MD [facility] 11/15/2022, 4:33 PM

## 2022-11-16 DIAGNOSIS — R112 Nausea with vomiting, unspecified: Secondary | ICD-10-CM | POA: Diagnosis not present

## 2022-11-16 DIAGNOSIS — R1084 Generalized abdominal pain: Secondary | ICD-10-CM | POA: Diagnosis not present

## 2022-11-16 DIAGNOSIS — N809 Endometriosis, unspecified: Secondary | ICD-10-CM | POA: Diagnosis not present

## 2022-11-16 MED ORDER — SODIUM CHLORIDE 0.9 % IV SOLN: 25.0000 mg | Freq: Once | INTRAVENOUS | Status: DC | PRN

## 2022-11-16 MED ORDER — PROMETHAZINE HCL 25 MG PO TABS: 25.0000 mg | ORAL_TABLET | Freq: Four times a day (QID) | ORAL | 0 refills | Status: AC | PRN

## 2022-11-16 MED ORDER — HYDROMORPHONE HCL 1 MG/ML IJ SOLN
1.0000 mg | Freq: Once | INTRAMUSCULAR | Status: AC
  Administered 2022-11-16: 1 mg via INTRAVENOUS

## 2022-11-16 MED ORDER — DIPHENHYDRAMINE HCL 50 MG/ML IJ SOLN
25.0000 mg | Freq: Once | INTRAMUSCULAR | Status: AC
  Administered 2022-11-16: 25 mg via INTRAVENOUS

## 2022-11-16 MED ORDER — ONDANSETRON HCL 4 MG/2ML IJ SOLN: 4.0000 mg | Freq: Once | INTRAMUSCULAR | Status: DC | PRN

## 2022-11-16 MED ORDER — OXYCODONE HCL 5 MG PO TABS: 5.0000 mg | ORAL_TABLET | ORAL | 0 refills | Status: AC | PRN

## 2022-11-16 NOTE — Discharge Summary (Signed)
DVT Study Patient Name:  PELLA ORIGER  Date of Exam:   11/11/2022 Medical Rec #: [id number]          Accession #:    [id number] Date of Birth: 10-14-1995          Patient Gender: F Patient Age:   27 years Exam Location:  [facility] Procedure:      VAS [place] LOWER EXTREMITY VENOUS (DVT) Referring Phys: Marguerita Merles --------------------------------------------------------------------------------  Indications: Pain.  Risk Factors: Recent extended travel DVT Hx obesity. Anticoagulation: Eliquis. Comparison Study: No prior study Performing Technologist: [name]  Examination Guidelines: A complete evaluation includes B-mode imaging, spectral Doppler, color Doppler, and power  Doppler as needed of all accessible portions of each vessel. Bilateral testing is considered an integral part of a complete examination. Limited examinations for reoccurring indications may be performed as noted. The reflux portion of the exam is performed with the patient in reverse Trendelenburg.  +---------+---------------+---------+-----------+----------+--------------+ RIGHT    CompressibilityPhasicitySpontaneityPropertiesThrombus Aging +---------+---------------+---------+-----------+----------+--------------+ CFV      Full           Yes      Yes                                 +---------+---------------+---------+-----------+----------+--------------+ SFJ      Full                                                        +---------+---------------+---------+-----------+----------+--------------+ FV Prox  Full                                                        +---------+---------------+---------+-----------+----------+--------------+ FV Mid   Full                                                        +---------+---------------+---------+-----------+----------+--------------+ FV DistalFull                                                        +---------+---------------+---------+-----------+----------+--------------+ PFV      Full                                                        +---------+---------------+---------+-----------+----------+--------------+ POP      Full           Yes      Yes                                 +---------+---------------+---------+-----------+----------+--------------+ PTV      Full                                                        +---------+---------------+---------+-----------+----------+--------------+  DVT Study Patient Name:  PELLA ORIGER  Date of Exam:   11/11/2022 Medical Rec #: [id number]          Accession #:    [id number] Date of Birth: 10-14-1995          Patient Gender: F Patient Age:   27 years Exam Location:  [facility] Procedure:      VAS [place] LOWER EXTREMITY VENOUS (DVT) Referring Phys: Marguerita Merles --------------------------------------------------------------------------------  Indications: Pain.  Risk Factors: Recent extended travel DVT Hx obesity. Anticoagulation: Eliquis. Comparison Study: No prior study Performing Technologist: [name]  Examination Guidelines: A complete evaluation includes B-mode imaging, spectral Doppler, color Doppler, and power  Doppler as needed of all accessible portions of each vessel. Bilateral testing is considered an integral part of a complete examination. Limited examinations for reoccurring indications may be performed as noted. The reflux portion of the exam is performed with the patient in reverse Trendelenburg.  +---------+---------------+---------+-----------+----------+--------------+ RIGHT    CompressibilityPhasicitySpontaneityPropertiesThrombus Aging +---------+---------------+---------+-----------+----------+--------------+ CFV      Full           Yes      Yes                                 +---------+---------------+---------+-----------+----------+--------------+ SFJ      Full                                                        +---------+---------------+---------+-----------+----------+--------------+ FV Prox  Full                                                        +---------+---------------+---------+-----------+----------+--------------+ FV Mid   Full                                                        +---------+---------------+---------+-----------+----------+--------------+ FV DistalFull                                                        +---------+---------------+---------+-----------+----------+--------------+ PFV      Full                                                        +---------+---------------+---------+-----------+----------+--------------+ POP      Full           Yes      Yes                                 +---------+---------------+---------+-----------+----------+--------------+ PTV      Full                                                        +---------+---------------+---------+-----------+----------+--------------+  Physician Discharge Summary   Nota Bew OZH:[id number] DOB: 1995-03-02 DOA: 11/10/2022  PCP: Oneita Hurt, No  Admit date: 11/10/2022 Discharge date: 11/16/2022   Admitted From: Home Disposition:  Home Discharging physician: Lewie Chamber, MD Barriers to discharge: none  Recommendations at discharge: Follow up with Dr. Briscoe Deutscher   Discharge Condition: stable CODE STATUS: Full Diet recommendation:  Diet Orders (From admission, onward)     Start     Ordered   11/16/22 0000  Diet general        11/16/22 1144   11/10/22 1443  Diet regular Room service appropriate? Yes; Fluid consistency: Thin  Diet effective now       Question Answer Comment  Room service appropriate? Yes   Fluid consistency: Thin      11/10/22 1442            Hospital Course: Carrolyn Himelright is a 27 y.o. female with PMH asthma, prediabetes, celiac, cluster B personality disorder, panic disorder, collagen vascular disease, endometriosis, lupus, ovarian cyst, PE, reported seizures, obesity.  She presented with ongoing abdominal pain and nausea/vomiting.  Multiple hospitalizations in the past for similar. Some issues with constipation as well contributing to symptoms in the past.  She was admitted for pain and nausea control with diet advancement as tolerated.  Assessment and Plan:  Abdominal pain Endometriosis Constipation N/V -CT abdomen/pelvis on admission negative for acute findings in the abdomen or pelvis.  Moderate stool burden appreciated involving terminal ileum; this may be contributing to her overall symptoms along with underlying endometriosis -Continuing on pain regimen and nausea regimen - Continue current diet - continue laxative regimen - trial of bentyl also    Chest Pain and history of PE noncompliant with anticoagulation -Troponin unremarkable and EKG nonischemic CTA done initially was nondiagnostic and recommendations are to repeat -Checked D-dimer and was 0.74 and repeat check  negative; LE duplex negative for DVT also - anticoagulation stopped   Tachycardia -Suspect multifactorial.  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   -She does report she has had issues with ileus in the past secondary to bowel complications from her endometriosis   SLE/collagen vascular disorder -Continue Hydroxychloroquine 400 mg po Daily  -Patient needs to establish with a rheumatologist in the [facility] area if she continues to live here  Thrombocytosis -Considered reactive and has also normalized   Endometriosis with acute on chronic Pain -Patient reports significant improvement in symptoms after the administration of Zoladex.  Patient has not followed up with GYN locally.   -She must establish with a GYN if she is to be considered a candidate for this medication again. -Evaluated by GYN during previous hospitalization with recommendation to follow-up with Dr.Ajewole who specializes in complex endometriosis with severe bowel involvement -Patient  Physician Discharge Summary   Nota Bew OZH:[id number] DOB: 1995-03-02 DOA: 11/10/2022  PCP: Oneita Hurt, No  Admit date: 11/10/2022 Discharge date: 11/16/2022   Admitted From: Home Disposition:  Home Discharging physician: Lewie Chamber, MD Barriers to discharge: none  Recommendations at discharge: Follow up with Dr. Briscoe Deutscher   Discharge Condition: stable CODE STATUS: Full Diet recommendation:  Diet Orders (From admission, onward)     Start     Ordered   11/16/22 0000  Diet general        11/16/22 1144   11/10/22 1443  Diet regular Room service appropriate? Yes; Fluid consistency: Thin  Diet effective now       Question Answer Comment  Room service appropriate? Yes   Fluid consistency: Thin      11/10/22 1442            Hospital Course: Carrolyn Himelright is a 27 y.o. female with PMH asthma, prediabetes, celiac, cluster B personality disorder, panic disorder, collagen vascular disease, endometriosis, lupus, ovarian cyst, PE, reported seizures, obesity.  She presented with ongoing abdominal pain and nausea/vomiting.  Multiple hospitalizations in the past for similar. Some issues with constipation as well contributing to symptoms in the past.  She was admitted for pain and nausea control with diet advancement as tolerated.  Assessment and Plan:  Abdominal pain Endometriosis Constipation N/V -CT abdomen/pelvis on admission negative for acute findings in the abdomen or pelvis.  Moderate stool burden appreciated involving terminal ileum; this may be contributing to her overall symptoms along with underlying endometriosis -Continuing on pain regimen and nausea regimen - Continue current diet - continue laxative regimen - trial of bentyl also    Chest Pain and history of PE noncompliant with anticoagulation -Troponin unremarkable and EKG nonischemic CTA done initially was nondiagnostic and recommendations are to repeat -Checked D-dimer and was 0.74 and repeat check  negative; LE duplex negative for DVT also - anticoagulation stopped   Tachycardia -Suspect multifactorial.  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   -She does report she has had issues with ileus in the past secondary to bowel complications from her endometriosis   SLE/collagen vascular disorder -Continue Hydroxychloroquine 400 mg po Daily  -Patient needs to establish with a rheumatologist in the [facility] area if she continues to live here  Thrombocytosis -Considered reactive and has also normalized   Endometriosis with acute on chronic Pain -Patient reports significant improvement in symptoms after the administration of Zoladex.  Patient has not followed up with GYN locally.   -She must establish with a GYN if she is to be considered a candidate for this medication again. -Evaluated by GYN during previous hospitalization with recommendation to follow-up with Dr.Ajewole who specializes in complex endometriosis with severe bowel involvement -Patient  Physician Discharge Summary   Nota Bew OZH:[id number] DOB: 1995-03-02 DOA: 11/10/2022  PCP: Oneita Hurt, No  Admit date: 11/10/2022 Discharge date: 11/16/2022   Admitted From: Home Disposition:  Home Discharging physician: Lewie Chamber, MD Barriers to discharge: none  Recommendations at discharge: Follow up with Dr. Briscoe Deutscher   Discharge Condition: stable CODE STATUS: Full Diet recommendation:  Diet Orders (From admission, onward)     Start     Ordered   11/16/22 0000  Diet general        11/16/22 1144   11/10/22 1443  Diet regular Room service appropriate? Yes; Fluid consistency: Thin  Diet effective now       Question Answer Comment  Room service appropriate? Yes   Fluid consistency: Thin      11/10/22 1442            Hospital Course: Carrolyn Himelright is a 27 y.o. female with PMH asthma, prediabetes, celiac, cluster B personality disorder, panic disorder, collagen vascular disease, endometriosis, lupus, ovarian cyst, PE, reported seizures, obesity.  She presented with ongoing abdominal pain and nausea/vomiting.  Multiple hospitalizations in the past for similar. Some issues with constipation as well contributing to symptoms in the past.  She was admitted for pain and nausea control with diet advancement as tolerated.  Assessment and Plan:  Abdominal pain Endometriosis Constipation N/V -CT abdomen/pelvis on admission negative for acute findings in the abdomen or pelvis.  Moderate stool burden appreciated involving terminal ileum; this may be contributing to her overall symptoms along with underlying endometriosis -Continuing on pain regimen and nausea regimen - Continue current diet - continue laxative regimen - trial of bentyl also    Chest Pain and history of PE noncompliant with anticoagulation -Troponin unremarkable and EKG nonischemic CTA done initially was nondiagnostic and recommendations are to repeat -Checked D-dimer and was 0.74 and repeat check  negative; LE duplex negative for DVT also - anticoagulation stopped   Tachycardia -Suspect multifactorial.  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   -She does report she has had issues with ileus in the past secondary to bowel complications from her endometriosis   SLE/collagen vascular disorder -Continue Hydroxychloroquine 400 mg po Daily  -Patient needs to establish with a rheumatologist in the [facility] area if she continues to live here  Thrombocytosis -Considered reactive and has also normalized   Endometriosis with acute on chronic Pain -Patient reports significant improvement in symptoms after the administration of Zoladex.  Patient has not followed up with GYN locally.   -She must establish with a GYN if she is to be considered a candidate for this medication again. -Evaluated by GYN during previous hospitalization with recommendation to follow-up with Dr.Ajewole who specializes in complex endometriosis with severe bowel involvement -Patient  Physician Discharge Summary   Nota Bew OZH:[id number] DOB: 1995-03-02 DOA: 11/10/2022  PCP: Oneita Hurt, No  Admit date: 11/10/2022 Discharge date: 11/16/2022   Admitted From: Home Disposition:  Home Discharging physician: Lewie Chamber, MD Barriers to discharge: none  Recommendations at discharge: Follow up with Dr. Briscoe Deutscher   Discharge Condition: stable CODE STATUS: Full Diet recommendation:  Diet Orders (From admission, onward)     Start     Ordered   11/16/22 0000  Diet general        11/16/22 1144   11/10/22 1443  Diet regular Room service appropriate? Yes; Fluid consistency: Thin  Diet effective now       Question Answer Comment  Room service appropriate? Yes   Fluid consistency: Thin      11/10/22 1442            Hospital Course: Carrolyn Himelright is a 27 y.o. female with PMH asthma, prediabetes, celiac, cluster B personality disorder, panic disorder, collagen vascular disease, endometriosis, lupus, ovarian cyst, PE, reported seizures, obesity.  She presented with ongoing abdominal pain and nausea/vomiting.  Multiple hospitalizations in the past for similar. Some issues with constipation as well contributing to symptoms in the past.  She was admitted for pain and nausea control with diet advancement as tolerated.  Assessment and Plan:  Abdominal pain Endometriosis Constipation N/V -CT abdomen/pelvis on admission negative for acute findings in the abdomen or pelvis.  Moderate stool burden appreciated involving terminal ileum; this may be contributing to her overall symptoms along with underlying endometriosis -Continuing on pain regimen and nausea regimen - Continue current diet - continue laxative regimen - trial of bentyl also    Chest Pain and history of PE noncompliant with anticoagulation -Troponin unremarkable and EKG nonischemic CTA done initially was nondiagnostic and recommendations are to repeat -Checked D-dimer and was 0.74 and repeat check  negative; LE duplex negative for DVT also - anticoagulation stopped   Tachycardia -Suspect multifactorial.  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   -She does report she has had issues with ileus in the past secondary to bowel complications from her endometriosis   SLE/collagen vascular disorder -Continue Hydroxychloroquine 400 mg po Daily  -Patient needs to establish with a rheumatologist in the [facility] area if she continues to live here  Thrombocytosis -Considered reactive and has also normalized   Endometriosis with acute on chronic Pain -Patient reports significant improvement in symptoms after the administration of Zoladex.  Patient has not followed up with GYN locally.   -She must establish with a GYN if she is to be considered a candidate for this medication again. -Evaluated by GYN during previous hospitalization with recommendation to follow-up with Dr.Ajewole who specializes in complex endometriosis with severe bowel involvement -Patient  DVT Study Patient Name:  PELLA ORIGER  Date of Exam:   11/11/2022 Medical Rec #: [id number]          Accession #:    [id number] Date of Birth: 10-14-1995          Patient Gender: F Patient Age:   27 years Exam Location:  [facility] Procedure:      VAS [place] LOWER EXTREMITY VENOUS (DVT) Referring Phys: Marguerita Merles --------------------------------------------------------------------------------  Indications: Pain.  Risk Factors: Recent extended travel DVT Hx obesity. Anticoagulation: Eliquis. Comparison Study: No prior study Performing Technologist: [name]  Examination Guidelines: A complete evaluation includes B-mode imaging, spectral Doppler, color Doppler, and power  Doppler as needed of all accessible portions of each vessel. Bilateral testing is considered an integral part of a complete examination. Limited examinations for reoccurring indications may be performed as noted. The reflux portion of the exam is performed with the patient in reverse Trendelenburg.  +---------+---------------+---------+-----------+----------+--------------+ RIGHT    CompressibilityPhasicitySpontaneityPropertiesThrombus Aging +---------+---------------+---------+-----------+----------+--------------+ CFV      Full           Yes      Yes                                 +---------+---------------+---------+-----------+----------+--------------+ SFJ      Full                                                        +---------+---------------+---------+-----------+----------+--------------+ FV Prox  Full                                                        +---------+---------------+---------+-----------+----------+--------------+ FV Mid   Full                                                        +---------+---------------+---------+-----------+----------+--------------+ FV DistalFull                                                        +---------+---------------+---------+-----------+----------+--------------+ PFV      Full                                                        +---------+---------------+---------+-----------+----------+--------------+ POP      Full           Yes      Yes                                 +---------+---------------+---------+-----------+----------+--------------+ PTV      Full                                                        +---------+---------------+---------+-----------+----------+--------------+  DVT Study Patient Name:  PELLA ORIGER  Date of Exam:   11/11/2022 Medical Rec #: [id number]          Accession #:    [id number] Date of Birth: 10-14-1995          Patient Gender: F Patient Age:   27 years Exam Location:  [facility] Procedure:      VAS [place] LOWER EXTREMITY VENOUS (DVT) Referring Phys: Marguerita Merles --------------------------------------------------------------------------------  Indications: Pain.  Risk Factors: Recent extended travel DVT Hx obesity. Anticoagulation: Eliquis. Comparison Study: No prior study Performing Technologist: [name]  Examination Guidelines: A complete evaluation includes B-mode imaging, spectral Doppler, color Doppler, and power  Doppler as needed of all accessible portions of each vessel. Bilateral testing is considered an integral part of a complete examination. Limited examinations for reoccurring indications may be performed as noted. The reflux portion of the exam is performed with the patient in reverse Trendelenburg.  +---------+---------------+---------+-----------+----------+--------------+ RIGHT    CompressibilityPhasicitySpontaneityPropertiesThrombus Aging +---------+---------------+---------+-----------+----------+--------------+ CFV      Full           Yes      Yes                                 +---------+---------------+---------+-----------+----------+--------------+ SFJ      Full                                                        +---------+---------------+---------+-----------+----------+--------------+ FV Prox  Full                                                        +---------+---------------+---------+-----------+----------+--------------+ FV Mid   Full                                                        +---------+---------------+---------+-----------+----------+--------------+ FV DistalFull                                                        +---------+---------------+---------+-----------+----------+--------------+ PFV      Full                                                        +---------+---------------+---------+-----------+----------+--------------+ POP      Full           Yes      Yes                                 +---------+---------------+---------+-----------+----------+--------------+ PTV      Full                                                        +---------+---------------+---------+-----------+----------+--------------+  DVT Study Patient Name:  PELLA ORIGER  Date of Exam:   11/11/2022 Medical Rec #: [id number]          Accession #:    [id number] Date of Birth: 10-14-1995          Patient Gender: F Patient Age:   27 years Exam Location:  [facility] Procedure:      VAS [place] LOWER EXTREMITY VENOUS (DVT) Referring Phys: Marguerita Merles --------------------------------------------------------------------------------  Indications: Pain.  Risk Factors: Recent extended travel DVT Hx obesity. Anticoagulation: Eliquis. Comparison Study: No prior study Performing Technologist: [name]  Examination Guidelines: A complete evaluation includes B-mode imaging, spectral Doppler, color Doppler, and power  Doppler as needed of all accessible portions of each vessel. Bilateral testing is considered an integral part of a complete examination. Limited examinations for reoccurring indications may be performed as noted. The reflux portion of the exam is performed with the patient in reverse Trendelenburg.  +---------+---------------+---------+-----------+----------+--------------+ RIGHT    CompressibilityPhasicitySpontaneityPropertiesThrombus Aging +---------+---------------+---------+-----------+----------+--------------+ CFV      Full           Yes      Yes                                 +---------+---------------+---------+-----------+----------+--------------+ SFJ      Full                                                        +---------+---------------+---------+-----------+----------+--------------+ FV Prox  Full                                                        +---------+---------------+---------+-----------+----------+--------------+ FV Mid   Full                                                        +---------+---------------+---------+-----------+----------+--------------+ FV DistalFull                                                        +---------+---------------+---------+-----------+----------+--------------+ PFV      Full                                                        +---------+---------------+---------+-----------+----------+--------------+ POP      Full           Yes      Yes                                 +---------+---------------+---------+-----------+----------+--------------+ PTV      Full                                                        +---------+---------------+---------+-----------+----------+--------------+  Physician Discharge Summary   Nota Bew OZH:[id number] DOB: 1995-03-02 DOA: 11/10/2022  PCP: Oneita Hurt, No  Admit date: 11/10/2022 Discharge date: 11/16/2022   Admitted From: Home Disposition:  Home Discharging physician: Lewie Chamber, MD Barriers to discharge: none  Recommendations at discharge: Follow up with Dr. Briscoe Deutscher   Discharge Condition: stable CODE STATUS: Full Diet recommendation:  Diet Orders (From admission, onward)     Start     Ordered   11/16/22 0000  Diet general        11/16/22 1144   11/10/22 1443  Diet regular Room service appropriate? Yes; Fluid consistency: Thin  Diet effective now       Question Answer Comment  Room service appropriate? Yes   Fluid consistency: Thin      11/10/22 1442            Hospital Course: Carrolyn Himelright is a 27 y.o. female with PMH asthma, prediabetes, celiac, cluster B personality disorder, panic disorder, collagen vascular disease, endometriosis, lupus, ovarian cyst, PE, reported seizures, obesity.  She presented with ongoing abdominal pain and nausea/vomiting.  Multiple hospitalizations in the past for similar. Some issues with constipation as well contributing to symptoms in the past.  She was admitted for pain and nausea control with diet advancement as tolerated.  Assessment and Plan:  Abdominal pain Endometriosis Constipation N/V -CT abdomen/pelvis on admission negative for acute findings in the abdomen or pelvis.  Moderate stool burden appreciated involving terminal ileum; this may be contributing to her overall symptoms along with underlying endometriosis -Continuing on pain regimen and nausea regimen - Continue current diet - continue laxative regimen - trial of bentyl also    Chest Pain and history of PE noncompliant with anticoagulation -Troponin unremarkable and EKG nonischemic CTA done initially was nondiagnostic and recommendations are to repeat -Checked D-dimer and was 0.74 and repeat check  negative; LE duplex negative for DVT also - anticoagulation stopped   Tachycardia -Suspect multifactorial.  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   -She does report she has had issues with ileus in the past secondary to bowel complications from her endometriosis   SLE/collagen vascular disorder -Continue Hydroxychloroquine 400 mg po Daily  -Patient needs to establish with a rheumatologist in the [facility] area if she continues to live here  Thrombocytosis -Considered reactive and has also normalized   Endometriosis with acute on chronic Pain -Patient reports significant improvement in symptoms after the administration of Zoladex.  Patient has not followed up with GYN locally.   -She must establish with a GYN if she is to be considered a candidate for this medication again. -Evaluated by GYN during previous hospitalization with recommendation to follow-up with Dr.Ajewole who specializes in complex endometriosis with severe bowel involvement -Patient

## 2022-11-16 NOTE — TOC Transition Note (Signed)
Transition of Care [facility]) - CM/SW Discharge Note   Patient Details  Name: [name] MRN: [id number] Date of Birth: Oct 25, 1995  Transition of Care [facility]) CM/SW Contact:  Lanier Clam, RN Phone Number: 11/16/2022, 12:31 PM   Clinical Narrative:d/c home no needs.       Final next level of care: Home/Self Care Barriers to Discharge: No Barriers Identified   Patient Goals and CMS Choice CMS Medicare.gov Compare Post Acute Care list provided to:: Patient Choice offered to / list presented to : Patient  Discharge Placement                         Discharge Plan and Services Additional resources added to the After Visit Summary for     Discharge Planning Services: CM Consult                                 Social Determinants of Health (SDOH) Interventions SDOH Screenings   Food Insecurity: No Food Insecurity (11/10/2022)  Housing: Low Risk  (11/10/2022)  Transportation Needs: No Transportation Needs (11/10/2022)  Utilities: Not At Risk (11/10/2022)  Alcohol Screen: Low Risk  (05/30/2020)  Depression (PHQ2-9): Medium Risk (12/28/2021)  Social Connections: Low Risk  (02/03/2022)   Received from [employer], [facility]  Tobacco Use: Low Risk  (11/11/2022)     Readmission Risk Interventions    11/14/2022    2:41 PM 09/25/2022    9:57 AM 08/25/2021   10:42 AM  Readmission Risk Prevention Plan  Transportation Screening Complete Complete Complete  PCP or Specialist Appt within 5-7 Days  Complete   PCP or Specialist Appt within 3-5 Days Complete    Home Care Screening  Complete   Medication Review (RN CM)  Referral to Pharmacy   HRI or Home Care Consult Complete    Social Work Consult for Recovery Care Planning/Counseling Complete    Palliative Care Screening Not Applicable    Medication Review [employer]) Complete  Complete  PCP or Specialist appointment within 3-5 days of discharge   Complete  HRI or Home Care  Consult   Complete  SW Recovery Care/Counseling Consult   Complete  Palliative Care Screening   Not Applicable  Skilled Nursing Facility   Not Applicable

## 2022-11-17 LAB — CULTURE, BLOOD (ROUTINE X 2)
Culture: NO GROWTH
Culture: NO GROWTH
Special Requests: ADEQUATE
Special Requests: ADEQUATE

## 2022-11-24 ENCOUNTER — Encounter (HOSPITAL_COMMUNITY): Payer: Self-pay

## 2022-11-24 ENCOUNTER — Ambulatory Visit (HOSPITAL_COMMUNITY)
Admission: EM | Admit: 2022-11-24 | Discharge: 2022-11-24 | Disposition: A | Discharge status: Home / Self Care | Payer: BC Managed Care – PPO | Attending: Family Medicine | Admitting: Family Medicine

## 2022-11-24 ENCOUNTER — Ambulatory Visit (INDEPENDENT_AMBULATORY_CARE_PROVIDER_SITE_OTHER): Payer: BC Managed Care – PPO

## 2022-11-24 DIAGNOSIS — J069 Acute upper respiratory infection, unspecified: Secondary | ICD-10-CM | POA: Diagnosis not present

## 2022-11-24 LAB — POC COVID19/FLU A&B COMBO
Covid Antigen, POC: NEGATIVE
Influenza A Antigen, POC: NEGATIVE
Influenza B Antigen, POC: NEGATIVE

## 2022-11-24 MED ORDER — HYDROCOD POLI-CHLORPHE POLI ER 10-8 MG/5ML PO SUER: 5.0000 mL | Freq: Every evening | ORAL | 0 refills | Status: AC | PRN

## 2022-11-24 MED ORDER — PREDNISONE 20 MG PO TABS: 40.0000 mg | ORAL_TABLET | Freq: Every day | ORAL | 0 refills | Status: AC

## 2022-11-24 NOTE — Discharge Instructions (Signed)
Be aware, you have been prescribed pain medications that may cause drowsiness. While taking this medication, do not take any other medications containing acetaminophen (Tylenol). Do not combine with alcohol or recreational drugs. Please do not drive, operate heavy machinery, or take part in activities that require making important decisions while on this medication as your judgement may be clouded.  

## 2022-11-24 NOTE — ED Triage Notes (Signed)
Patient c/o a non productive cough, generalized body aches, and hoarseness x 1 week. Patient was hospitalized for 1 week prior to these symptoms.  Patient is also concerned about her x-ray report regarding her lungs while she was hospitalized.  Patient states she has taken [employer], cough syrup with Promethazine and OTC cough medication with no relief.

## 2022-11-26 NOTE — ED Provider Notes (Signed)
[facility]   [id number] 11/24/22 Arrival Time: 1654  ASSESSMENT & PLAN:  1. Viral URI with cough    I have personally viewed and independently interpreted the imaging studies ordered this visit. No acute changes on CXR. Slight viral changes at bases. No signs of PNA.  Discussed typical duration of likely viral illness.  Results for orders placed or performed during the hospital encounter of 11/24/22  POC Covid + Flu A/B Antigen  Result Value Ref Range   Influenza A Antigen, POC Negative Negative   Influenza B Antigen, POC Negative Negative   Covid Antigen, POC Negative Negative    OTC symptom care as needed.  Discharge Medication List as of 11/24/2022  6:40 PM     START taking these medications   Details  chlorpheniramine-HYDROcodone (TUSSIONEX) 10-8 MG/5ML Take 5 mLs by mouth at bedtime as needed for cough., Starting Sat 11/24/2022, Normal    predniSONE (DELTASONE) 20 MG tablet Take 2 tablets (40 mg total) by mouth daily., Starting Sat 11/24/2022, Normal          Reviewed expectations re: course of current medical issues. Questions answered. Outlined signs and symptoms indicating need for more acute intervention. Understanding verbalized. After Visit Summary given.   SUBJECTIVE: History from: Patient. [name] is a 27 y.o. female.  Patient c/o a non productive cough, generalized body aches, and hoarseness x 1 week. Patient was hospitalized for 1 week prior to these symptoms. Was better when current symptoms started sev days ago.  Patient states she has taken [employer], cough syrup with Promethazine and OTC cough medication with no relief. Denies: fever. Normal PO intake without n/v/d.  OBJECTIVE:  Vitals:   11/24/22 1746  BP: (!) 160/113  Pulse: (!) 103  Resp: 16  Temp: 99.1 F (37.3 C)  TempSrc: Oral  SpO2: 96%    Recheck P: 96 General appearance: alert; no distress Eyes: PERRLA; EOMI; conjunctiva normal HENT: [place]; AT; with  nasal congestion Neck: supple  Lungs: speaks full sentences without difficulty; unlabored; very slight bilat exp wheezes Extremities: no edema Skin: warm and dry Neurologic: normal gait Psychological: alert and cooperative; normal mood and affect  Labs: Results for orders placed or performed during the hospital encounter of 11/24/22  POC Covid + Flu A/B Antigen  Result Value Ref Range   Influenza A Antigen, POC Negative Negative   Influenza B Antigen, POC Negative Negative   Covid Antigen, POC Negative Negative   Labs Reviewed  POC COVID19/FLU A&B COMBO    Imaging: DG Chest 2 View  Result Date: 11/24/2022 CLINICAL DATA:  Cough EXAM: CHEST - 2 VIEW COMPARISON:  11/12/2022 FINDINGS: The heart size and mediastinal contours are within normal limits. Mildly increased perihilar interstitial markings, more pronounced on the left. No focal airspace consolidation. No pleural effusion or pneumothorax. The visualized skeletal structures are unremarkable. IMPRESSION: Mildly increased perihilar interstitial markings, more pronounced on the left, which may reflect bronchitic lung changes or mild edema. No focal airspace consolidation. Electronically Signed   By: Duanne Guess D.O.   On: 11/24/2022 18:22    Allergies  Allergen Reactions   Amoxicillin Anaphylaxis, Swelling and Other (See Comments)    Hand swelling (PENFAST 4+) Tolerated ceftriaxone 2021 PenFast Score 4+:  High risk of positive penicillin allergy (50%) Oral challenge NOT recommended  Cephalosporins may be considered under close monitoring   Azithromycin Anaphylaxis and Other (See Comments)   Fish-Derived Products Anaphylaxis and Other (See Comments)    Eats shellfish alright  Peanut-Containing Drug Products Hives, Itching, Rash and Other (See Comments)         Gluten Meal Itching, Rash and Other (See Comments)    Celiac disease   Iodinated Contrast Media Hives and Itching    02/04/2020 Patient stated has history of  itching and hives with CT IV Iodine contrast, premedicated with Benadryl 25 mg IV prior to CT IV Iodine scan today.Post CT scan patient reported itching, Benadryl 25 mg IV given, symptoms resolved, NP recommends for patient to get premedicated with Benadryl 50 mg IV prior to future CT IV Iodine scans.    Other Hives, Itching, Rash and Other (See Comments)    Moderna covid vaccine -Syncope - ICU 2 weeks Tree nuts- Hives, itching, rashes   Iodine Itching   Lactose Intolerance (Gi) Nausea And Vomiting and Other (See Comments)    Can tolerate, in small doses    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   pt refused about this medical problem and stated: I don't have this problem"   Collagen vascular disease (HCC)    Diarrhea    Patient mentions diagnosis of ulcerative colitis but it is not clear she actually has UC   Endometriosis    Hypertension 09/27/2022   Lupus    Ovarian cyst    Panic disorder 05/30/2020   Pulmonary embolus (HCC) 02/06/2020   Seizures (HCC)    Social History   Socioeconomic History   Marital status: Single    Spouse name: Not on file   Number of children: Not on file   Years of education: Not on file   Highest education level: Not on file  Occupational History   Not on file  Tobacco Use   Smoking status: Never   Smokeless tobacco: Never  Vaping Use   Vaping status: Never Used  Substance and Sexual Activity   Alcohol use: Never   Drug use: Never   Sexual activity: Not Currently    Partners: Male    Birth control/protection: Pill  Other Topics Concern   Not on file  Social History Narrative   Not on file   Social Determinants of Health   Financial Resource Strain: Not on file  Food Insecurity: No Food Insecurity (11/10/2022)   Hunger Vital Sign    Worried About Running Out of Food in the Last Year: Never true    Ran Out of Food in the Last Year: Never true   Transportation Needs: No Transportation Needs (11/10/2022)   PRAPARE - Administrator, Civil Service (Medical): No    Lack of Transportation (Non-Medical): No  Physical Activity: Not on file  Stress: Not on file  Social Connections: Low Risk  (02/03/2022)   Received from [employer], [facility]   Family and [employer] Support    : Not on file    : Not on file  Intimate Partner Violence: Not At Risk (11/10/2022)   Humiliation, Afraid, Rape, and Kick questionnaire    Fear of Current or Ex-Partner: No    Emotionally Abused: No    Physically Abused: No    Sexually Abused: No   Family History  Problem Relation Age of Onset   Hypertension Mother    Hypercholesterolemia Mother    Rheum arthritis Mother    Diabetes Father    Hypertension Father    Prostate cancer Father    Thalassemia Father  Autism Brother    Cancer Maternal Grandmother        Ovarian   Breast cancer Other        Paternal great aunt's breast cancer   Cancer Other        patneral great aunt's ovarian cancer   Past Surgical History:  Procedure Laterality Date   APPENDECTOMY     CHOLECYSTECTOMY  2015   ESOPHAGOGASTRODUODENOSCOPY Left 09/24/2022   Procedure: ESOPHAGOGASTRODUODENOSCOPY (EGD);  Surgeon: Shellia Cleverly, DO;  Location: [facility] ENDOSCOPY;  Service: Gastroenterology;  Laterality: Left;   ESOPHAGOGASTRODUODENOSCOPY (EGD) WITH PROPOFOL N/A 06/18/2020   Procedure: ESOPHAGOGASTRODUODENOSCOPY (EGD) WITH PROPOFOL;  Surgeon: Sherrilyn Rist, MD;  Location: [facility] ENDOSCOPY;  Service: Gastroenterology;  Laterality: N/A;   EXCISION OF ENDOMETRIOMA     Ovarian cyst removal   excision of endometriosis     FOOT FRACTURE SURGERY     w/ hardware   HERNIA REPAIR     TONSILLECTOMY       Mardella Layman, MD 11/26/22 1020

## 2022-11-27 ENCOUNTER — Ambulatory Visit: Payer: Self-pay

## 2022-11-28 ENCOUNTER — Ambulatory Visit (HOSPITAL_COMMUNITY): Payer: Self-pay

## 2022-12-10 IMAGING — US US PELVIS COMPLETE WITH TRANSVAGINAL
1 series · 14 of 25 positions shown · non-contrast
Comparison: January 02, 2000

CLINICAL DATA: Pelvic pain, history of endometriosis

EXAM:
TRANSABDOMINAL ULTRASOUND OF PELVIS
TECHNIQUE: Transabdominal ultrasound examination of the pelvis was performed
including evaluation of the uterus, ovaries, adnexal regions, and
pelvic cul-de-sac.

[Series 1: us pelvis complete with transvaginal · 14 of 31 slices shown]
[im 1/31]
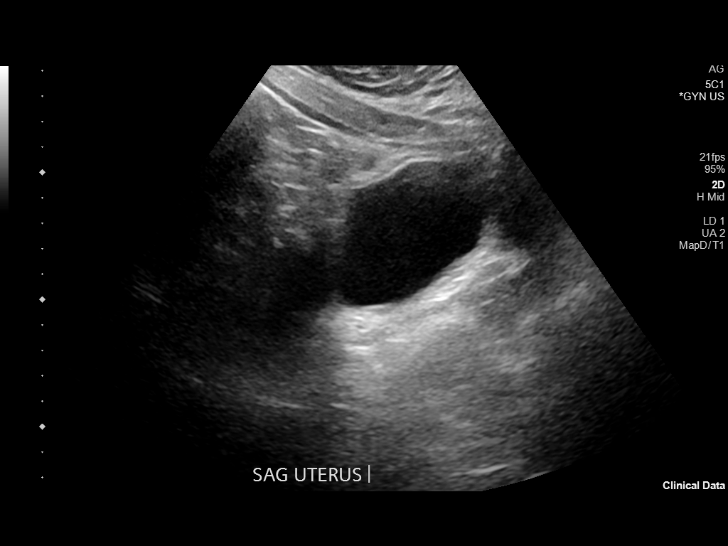
[im 3/31]
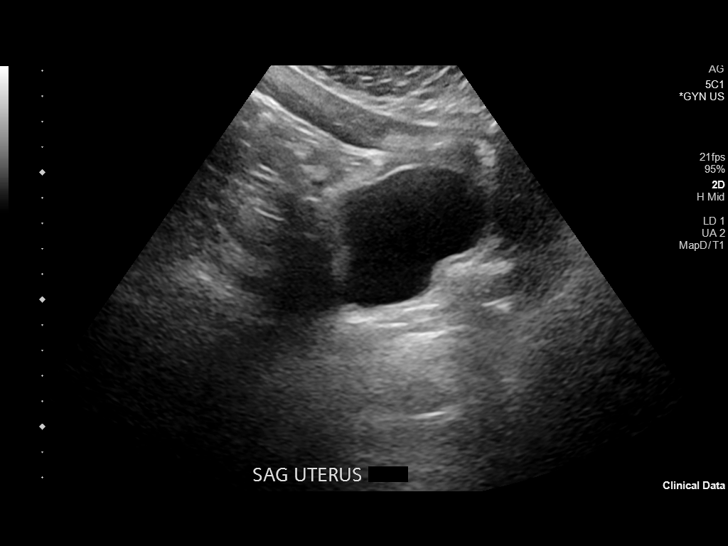
[im 6/31]
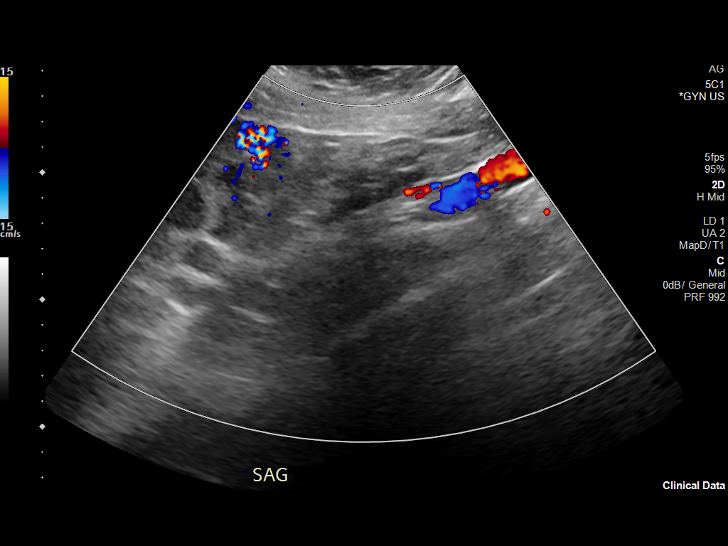
[im 8/31]
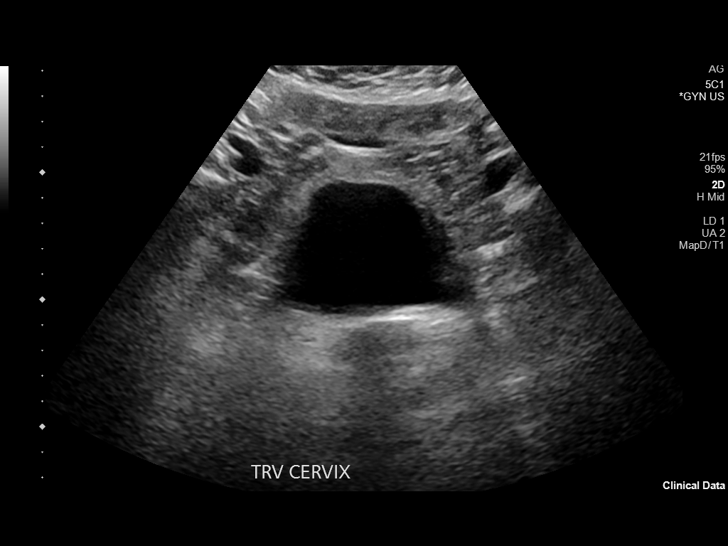
[im 11/31]
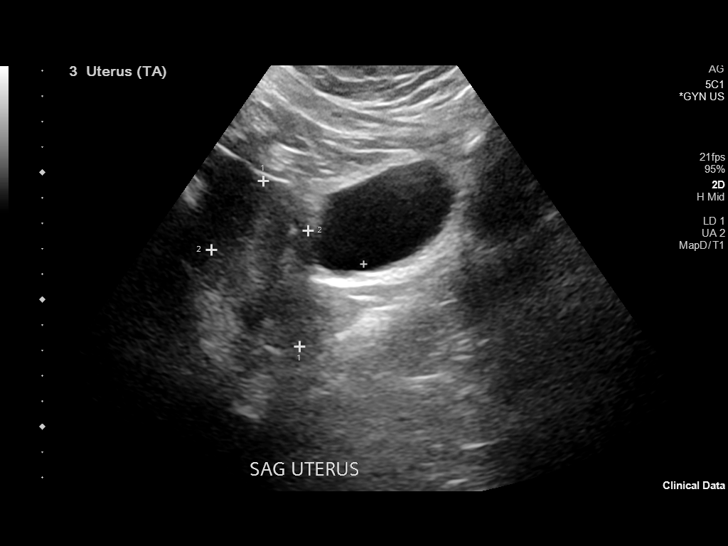
[im 12/31]
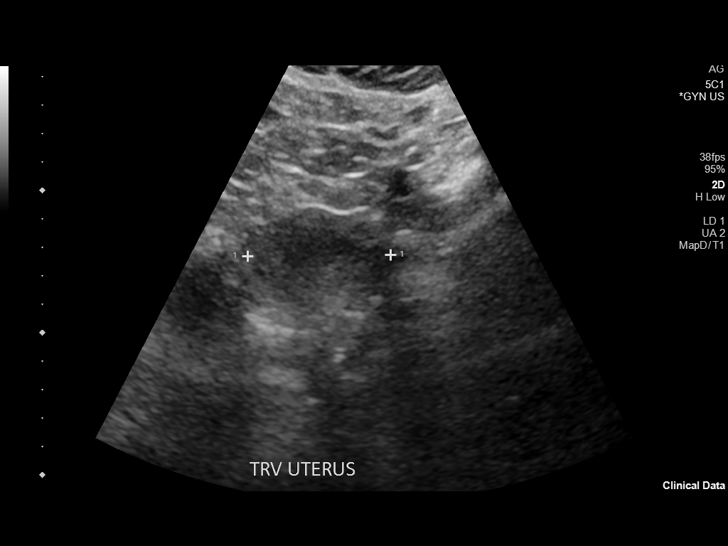
[im 14/31]
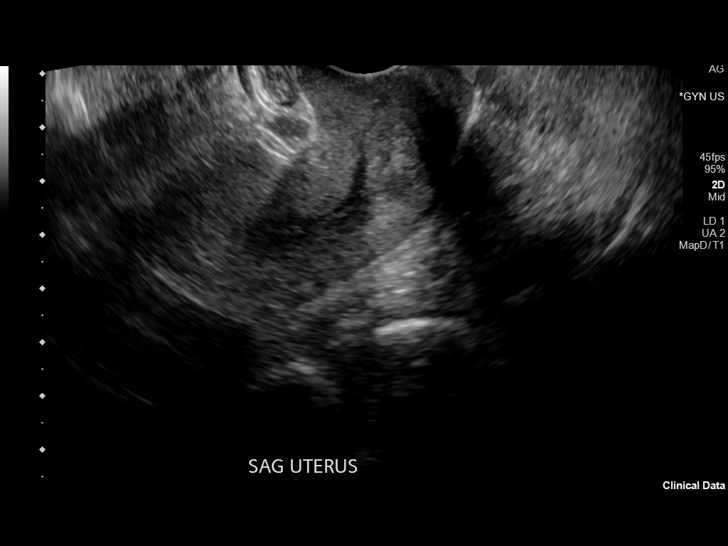
[im 17/31]
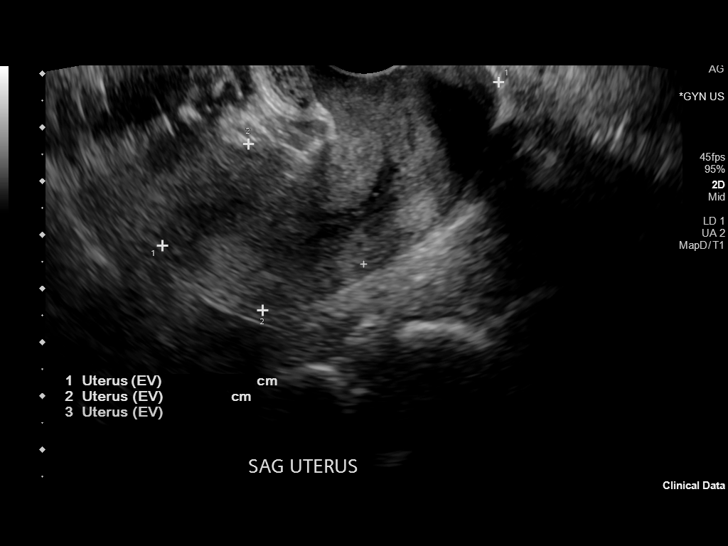
[im 19/31]
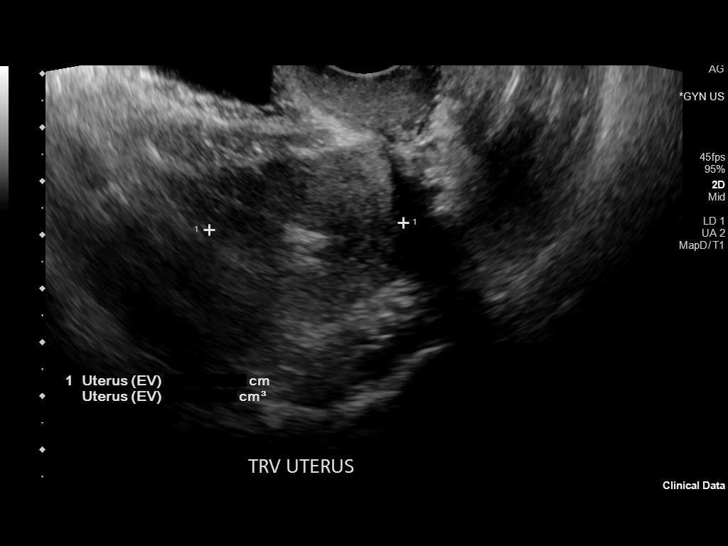
[im 21/31]
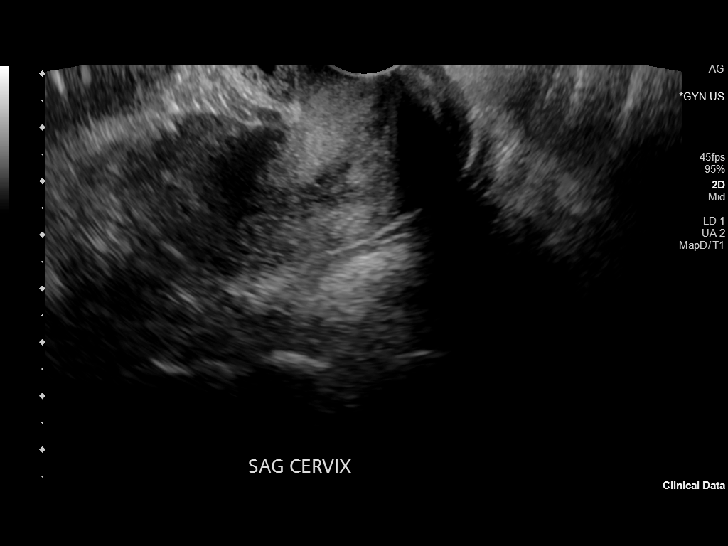
[im 23/31]
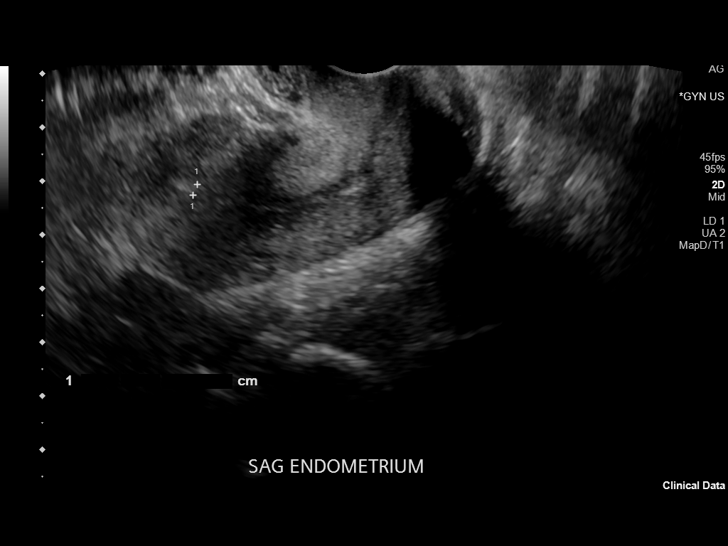
[im 26/31]
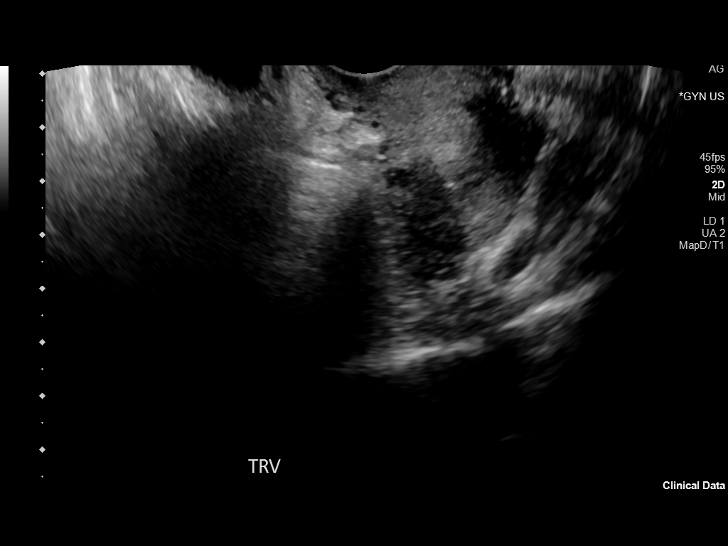
[im 28/31]
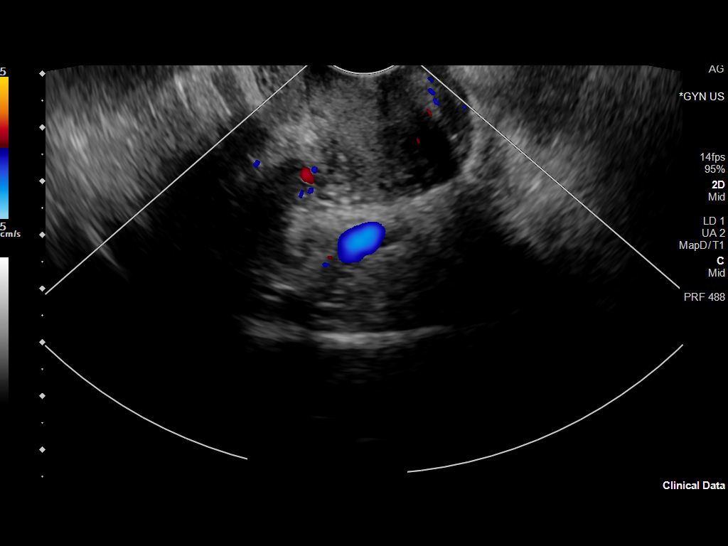
[im 31/31]
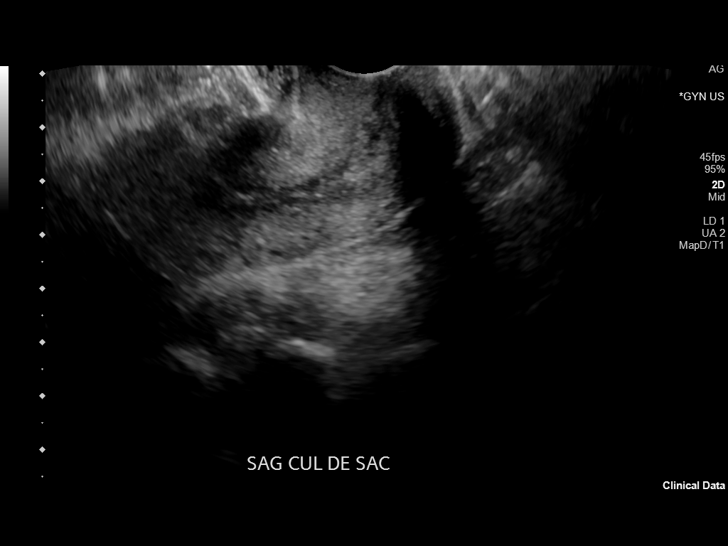

[14 of 25 positions shown; findings below may reference images not displayed]

FINDINGS: Uterus

Measurements: 7.0 x 3.6 x 3.1 cm = volume: 41 mL. Anteverted without
fibroids or other mass visualized.

Endometrium

Thickness: 2.  No focal abnormality visualized.

Right ovary

Not visualized

Left ovary

Not visualized

Other findings:  No abnormal free fluid.

Patient was unable to tolerate the transvaginal portion of the exam.
IMPRESSION: Patient was unable to tolerate the transvaginal portion of the exam
within this context: Normal appearing uterus and ovaries not
visualized.

## 2023-05-26 NOTE — ED Provider Notes (Addendum)
 Emergency Department Provider Note Location: [facility] EMERGENCY DEPARTMENT  Patient ID: [name] is a 28 y.o. female. Arrival Date/Time: 05/26/2023 9356  Chief Complaint   Chief Complaint  Patient presents with  . Abdominal Pain    Periumbilical and hypogastric. Onset 2 days. Hx of lupus, endometriosis and colitis  . Numbness    Bilateral legs. Onset 2 days  . Neck Pain    Onset last night. Denies fall/trauma    History of Present Illness  [name] is a 28 yo African American F admitted on 04/24/2023 with PMH of chronic abdominal pain, stage IV endometriosis, systemic lupus erythematous, GERD/Barrett's esophagus who presented for worsening abdominal pain, nausea, and emesis. She reports a longstanding history of intermittent abdominal pain, which has been increasing in frequency and severity. Two days ago, she began experiencing a significant exacerbation of her symptoms, with severe abdominal pain described as stabbing, along with nausea and emesis, NBNB. She also reports a stiff neck, dizziness, and pain radiating from her neck down to her lower back and legs. Her abdomen is tender and more distended than usual, making it very sensitive.  She has a history of bowel endometriosis and has been under the care of multiple specialists at [facility]. She was referred to colorectal oncology due to concerns that her symptoms may not be solely attributable to endometriosis. Appointment is on 05/20 with Arnold Booty.  A previous colonoscopy was negative, but an MRI suggested inflammation of cecum, leading to a recommendation for a repeat colonoscopy. She has been managing her symptoms with Zofran , which she took prior to arrival, but it was ineffective in preventing emesis. She denies taking any other medications for her symptoms.  Patient has hx of chronic abdominal pain. Recently underwent negative GI workup in March which included MRI and  EGD/colonoscopy. In April, she had TV US  which showed 3.8 cm simple right ovarian cyst which is to be followed. She has required admission in past for IVF, prn antiemetics, and pain control.   MRI report in March 2025:   1. Mild segmental wall thickening and enhancement of the cecum suggestive of colitis without stricture, fistula or abscess.   2. Remaining large and small bowel loops normal in caliber without evidence of stricture, fistula, abscess or active inflammation.     History provided by:  Patient, medical records and parent Language interpreter used: No    Abdominal Pain Numbness Associated symptoms: abdominal pain   Neck Pain   History  Allergies Allergies  Allergen Reactions  . Amoxicillin Anaphylaxis and PENFAST 4+    Tolerated ceftriaxone  2021 PenFast Score 4+:  High risk of positive penicillin allergy (50%) Oral challenge NOT recommended  Cephalosporins may be considered under close monitoring   . Azithromycin Anaphylaxis  . Fish Containing Products Anaphylaxis  . Gluten Itching and Rash    celiac  . Peanut Hives, Itching and Rash  . Celebrex [Celecoxib] Other (See Comments)    Do not take per MD  . Duloxetine  Other (See Comments)    caused suicidal thoughts   . Iodine Itching    Can be taken when diphenhydramine  premed is given.  . Ketorolac  Other (See Comments)    Do not take per MD  . Nsaids (Non-Steroidal Anti-Inflammatory Drug) Other (See Comments)    Do not take per MD    Past Medical History Past Medical History:  Diagnosis Date  . Anemia Slightly Anemic  . Asthma   . Celiac  disease   . Colitis   . Endometriosis Stage IV Severe  . Fibroid A few  . Immune deficiency disorder (HCC) Lupus  . Lupus   . Peptic ulceration Sometimes    Past Surgical History Past Surgical History:  Procedure Laterality Date  . ABDOMINAL SURGERY     endometreosis  . CHOLECYSTECTOMY    . COLONOSCOPY N/A 04/09/2023   Procedure: COLONOSCOPY;  Surgeon:  Chilton Standing, MD;  Location: [facility] ENDOSCOPY;  Service: Gastroenterology;  Laterality: N/A;  . COLPOSCOPY    . CYSTECTOMY    . DILATION AND CURETTAGE OF UTERUS    . ENDOMETRIAL ABLATION    . ENDOMETRIAL BIOPSY    . ESOPHAGOGASTRODUODENOSCOPY (EGD) N/A 04/09/2023   Procedure: EGD with cold bx;  Surgeon: Chilton Standing, MD;  Location: [facility] ENDOSCOPY;  Service: Gastroenterology;  Laterality: N/A;  . MYOMECTOMY    . POLYPECTOMY    . SINUS SURGERY      Past Family History Family History  Problem Relation Age of Onset  . Colon cancer Other   . Diabetes Father   . No history of cancer Father        Prostate    Maternal Grandfather        Prostate    Maternal Grandmother        Ovarian and Breast Cancer    Paternal Aunt        Colon Cancer    Paternal Grandmother        Ovarian  . Prostate cancer Father   . Rheum arthritis Mother   . Stroke Maternal Grandmother        Stroke (my grandmother) passed away  . Thalassemia Father   . Uterine cancer Maternal Grandmother     Past Social History Social History   Tobacco Use  . Smoking status: Never  . Smokeless tobacco: Never  . Tobacco comments:    Never  Vaping Use  . Vaping status: Never Used  Substance and Sexual Activity  . Alcohol use: No  . Drug use: No  . Sexual activity: Not Currently    Partners: Male    Birth control/protection: Abstinence, Other    Comment: Pill  Social History Narrative  . Not on file    Past Obstetric History OB History  No obstetric history on file.    Medications   ED Medications  Medication Sig Disc. Start Date End Date Taking? Authorizing Provider  BIOFREEZE, MENTHOL, TOP Apply 1 Application topically as needed (pain).     Provider, Not In System, MD  brimonidine (Lumify) 0.025 % drops Administer 2 drops to both eyes as needed (ocular redness).     Provider, Not In System, MD  calcium  carbonate (TUMS) 200 mg calcium  (500 mg) chewable tablet Chew 1 tablet (500 mg of Calcium   Carbonate total) as needed for indigestion or heartburn.     Provider, Not In System, MD  cholecalciferol, vitamin D3, (VITAMIN D3 ORAL) Take 1 each by mouth daily.     Provider, Not In System, MD  diphenhydrAMINE -acetaminophen  (TYLENOL  PM) 25-500 mg tablet Take 1 tablet by mouth nightly as needed for sleep (insomnia by zolpiden not effective).     Provider, Not In System, MD  fluticasone  propionate (FLONASE ) 50 mcg/actuation nasal spray 2 sprays (100 mcg total) by Each Naris route daily as needed for rhinitis.     Provider, Not In System, MD  ginger root extract 15 mg tablet,chewable Chew 1 each as needed (nausea/vomiting).     Provider, Not  In System, MD  methocarbamoL  (ROBAXIN ) 500 MG tablet Take 1 tablet (500 mg total) by mouth 2 (two) times a day for 30 days.  05/31/23 06/30/23  Cinderella Dienes, MD  pantoprazole  (PROTONIX ) 40 MG EC tablet Take 1 tablet (40 mg total) by mouth 2 (two) times a day.     Provider, Not In System, MD  prenatal vit no.124/iron/folic (PRENATAL VITAMIN ORAL) Take 1 each by mouth daily. For endometriosis     Provider, Not In System, MD  promethazine  (PHENERGAN ) 25 MG tablet Take 1 tablet (25 mg total) by mouth every 6 (six) hours as needed for nausea or vomiting for up to 30 days. Pt preferred antiemetic  05/31/23 06/30/23  Cinderella Dienes, MD  simethicone  (GAS-X ORAL) Take 1 each by mouth as needed (flatulence/bloating).     Provider, Not In System, MD  acetaminophen  (TYLENOL ) 325 MG tablet Take 2 tablets (650 mg total) by mouth every 6 (six) hours as needed for mild pain for up to 30 days.  05/28/23 06/27/23 Yes Lakhva, Asma Fazal, MD  norethindrone  (Gallifrey ) 5 mg tablet Take 2 tablets (10 mg total) by mouth nightly for 30 days.  05/28/23 06/27/23 Yes Lakhva, Asma Fazal, MD  oxyCODone -acetaminophen  (PERCOCET) 5-325 mg per tablet Take 1 tablet by mouth every 6 (six) hours as needed for severe pain .acute pain. Max Daily Amount: 4 tablets  05/28/23  Yes Lakhva, Asma Fazal, MD  levETIRAcetam   (KEPPRA ) 500 MG tablet Take 1 tablet (500 mg total) by mouth 2 (two) times a day.     Provider, Not In System, MD  apixaban  (ELIQUIS ) 5 mg tablet Take 1 tablet (5 mg total) by mouth 2 (two) times a day.    Yes Provider, Not In System, MD  zolpidem  (AMBIEN ) 10 mg tablet Take 2 tablets (20 mg total) by mouth nightly. Takes 1-2 tab per night depending on how she feels.    Yes Provider, Not In System, MD  ALPRAZolam  (XANAX ) 1 MG tablet Take 1.5 tablets (1.5 mg total) by mouth 2 (two) times a day.  04/18/22  Yes Provider, Not In System, MD  hydrOXYchloroQUINE  (PLAQUENIL ) 200 mg tablet Take 2 tablets (400 mg total) by mouth nightly .rheumatoid arthritis.    Yes [provider]    Review of Systems  Review of Systems  Gastrointestinal:  Positive for abdominal pain.  Constitutional: Denies Fever, Denies Chills Eyes: Denies Diplopia, Denies Blurred Bilaterally Ears/Nose/Throat: Denies Sore Throat, Denies Nasal Congestion Respiratory: Denies Cough, Denies Shortness of Breath Cardiovascular: Denies Chest Pain, Denies Syncope GI: + Nausea, + Vomiting Musculoskeletal: Denies Myalgias, Denies Neck Pain Skin: Denies Rash, Denies Erythema Neurologic: Denies Headache, Denies Focal Weakness Psychiatric: Denies Stress, Denies Anxiety     Physical Exam   ED Triage Vitals [05/26/23 0648]  Temp Heart Rate Respiratory Rate BP SpO2  98 F 107 21 148/100 97 %    Temp src Heart Rate Source Patient Position BP Location FiO2 %  -- Monitor -- -- --   Body mass index is 50.66 kg/m.  Physical Exam Vitals and nursing note reviewed.   General: Awake, Alert, NAD Head: Atraumatic, Normocephalic Eyes: PERRL, EOMI, No scleral icterus, Normal conjunctiva Ears/Nose/Throat: Airway patent and atraumatic, Mucous membranes moist, Pharynx normal, No tongue fasciculations Neck: Supple, No meningismus, No carotid bruit, No thyromegaly Resp/Chest: Breath sounds normal, Breath sounds = bilaterally, No respiratory  distress, No rales, No rhonchi, No wheezing, No retractions Cardiovascular: Heart rate normal, Regular rhythm, Heart sounds normal, No murmurs/rubs/gallops, Capillary refill not  delayed, Peripheral circulation normal Abdomen: Soft, Nontender, Nondistended, No rebound, No guarding, No fluid wave, No pulsatile abdominal mass Back: No CVA tenderness, Inspection normal Lower Extremities: Atraumatic, No peripheral edema, < 2 second capillary refill Skin: No rash, Warm/Dry/Intact, Atraumatic, No jaundice, Normal skin turgor Neurologic: Oriented x 3, Speech normal, No motor deficits, No sensory deficits, Cerebellar normal, Normal gait, No tremors Psychiatric: Affect normal, Mood normal   ED Course   ED Course as of 06/13/23 2059  Lauretha Norris Anne's Documentation  Sun May 26, 2023  1032 Patient refusing ketorolac . States she can't have nsaids and can only have narcotics... Neuro aware of patient, will see.   1400 Dr.Lakhva accepts patient for admission  1449 Patient refusing LP, stating she'll only get it if she can be sedated and not feel anything. I told her we administer Lidocaine  to the region to numb any pain she may feel; however, she continues to decline, stating she'll wait for IR in the morning, and has PTSD from prior ER doc preforming LP back in 2021. She is aware of the risks of not pursuing timely LP but accepts these risks.       Clinical Impressions as of 06/13/23 2059  Chronic abdominal pain  Acute nonintractable headache, unspecified headache type  Neck pain  Drug-seeking behavior     MDM  Medical Decision Making Patient with history as above presented with: multiple medical complaints  History obtained from: Patient, prior records  Vital signs reviewed: Hypertensive, slightly tachycardic  EKG reviewed: interpretation as above  Labs reviewed: UA negative for infection, white count within normal limits, normal renal function, normal lipase, negative  hCG  Independently reviewed imaging: CT abd/pelvis shows no acute findings, CT c-spine/head shows no acute findings  Reviewed any external records provided.  Differential diagnosis as below.   Patient was treated with: IVF, Reglan , Benadryl , Famotidine , Morphine   Social determinants of health discussed and considered with regards to goals of care.  Disposition: Admit   Amount and/or Complexity of Data Reviewed Independent Historian: parent External Data Reviewed: notes. Labs: ordered. Decision-making details documented in ED Course. Radiology: ordered and independent interpretation performed. Decision-making details documented in ED Course. ECG/medicine tests: ordered and independent interpretation performed. Decision-making details documented in ED Course. Discussion of management or test interpretation with external provider(s): Neuro, IM  Risk Prescription drug management. Parenteral controlled substances. Decision regarding hospitalization.    Labs Results for orders placed or performed during the hospital encounter of 05/26/23  Urine culture  Result Value Ref Range   Urine culture SEE COMMENT   Blood culture, aerobic & anaerobic   Specimen: Arm, left; Blood  Result Value Ref Range   Blood culture isolate No growth after 5 days of incubation.   Blood culture, aerobic & anaerobic   Specimen: Arm, left; Blood  Result Value Ref Range   Blood culture isolate No growth after 5 days of incubation.   CSF culture   Specimen: Lumbar puncture; Cerebrospinal fluid  Result Value Ref Range   CSF culture isolate No growth after 3 days.   Fungus culture   Specimen: Lumbar puncture; Cerebrospinal fluid  Result Value Ref Range   Fungus culture isolate Specimen processed; culture in progress    Fungus culture isolate No growth after 1 week/s of incubation.    Fungus culture isolate No growth after 2 week/s of incubation.   Cryptococcal antigen, screen   Specimen: Lumbar  puncture; Cerebrospinal fluid  Result Value Ref Range   Cryptococcal Ag Negative -  No Cryptococcus antigen detected.   AFB culture   Specimen: Lumbar puncture; Cerebrospinal fluid  Result Value Ref Range   AFB culture isolate Specimen processed; culture in progress    AFB culture isolate No growth after 1 week/s of incubation.    AFB culture isolate No growth after 2 week/s of incubation.   Gram stain   Specimen: Lumbar puncture; Cerebrospinal fluid  Result Value Ref Range   Gram stain isolate Rare WBC's    Gram stain isolate No organisms seen   Meningitis/encephalitis panel   Specimen: Lumbar puncture; Cerebrospinal fluid  Result Value Ref Range   Escherichia coli K1 PCR, CSF Not Detected    Haemophilus influenza PCR, CSF Not Detected    Listeria monocytogenes PCR, CSF Not Detected    Neisseria meningitidis PCR, CSF Not Detected    Streptococcus agalactiae PCR, CSF Not Detected    Streptococcus pneumoniae PCR, CSF Not Detected    Cytomegalovirus PCR, CSF Not Detected    Enterovirus PCR, CSF Not Detected    Herpes simplex virus 1 (HSV-1) PCR, CSF Not Detected    Herpes simplex virus 2 (HSV-2) PCR, CSF Not Detected    Human herpes virus 6 (HHV-6) PCR, CSF Not Detected    Human parechovirus (HPeV) PCR, CSF Not Detected    Varicella zoster virus (VZV) PCR, CSF Not Detected    Cryptococcus neoformans/gattii PCR, CSF Not Detected   CBC with platelet and differential  Result Value Ref Range   WBC 8.22 4.50 - 11.00 k/uL   RBC 4.71 4.20 - 5.50 m/uL   HGB 10.9 (L) 12.0 - 16.0 g/dL   HCT 67.0 (L) 62.9 - 52.9 %   MCV 69.9 (L) 82.0 - 100.0 fL   MCH 23.1 (L) 27.0 - 34.0 pg   MCHC 33.1 31.0 - 37.0 g/dL   RDW - SD 56.1 62.9 - 55.0 fL   MPV 9.3 8.8 - 13.2 fL   Platelet count 418 (H) 150 - 400 k/uL   Nucleated RBC 0.00 /100 WBC   Neutrophils 67.6 39.0 - 69.0 %   Lymphocytes 22.6 (L) 25.0 - 45.0 %   Monocytes 5.1 0.0 - 10.0 %   Eosinophils 4.1 0.0 - 5.0 %   Basophils 0.4 0.0 - 1.0 %    Immature granulocytes 0.2 0.0 - 1.0 %  Comprehensive metabolic panel  Result Value Ref Range   Sodium 143 135 - 148 mEq/L   Potassium 3.7 3.5 - 5.0 mEq/L   Chloride 109 98 - 112 mEq/L   CO2 21 (L) 24 - 31 mEq/L   Anion gap 13 7 - 15 mEq/L   BUN 7 6 - 20 mg/dL   Creatinine 9.09 9.49 - 0.90 mg/dL   Glucose 894 (H) 65 - 99 mg/dL   Calcium  9.3 8.3 - 10.2 mg/dL   Protein 7.1 6.3 - 8.3 g/dL   Albumin 3.7 3.5 - 5.0 g/dL   Albumin/globulin ratio 1.1 0.7 - 3.8   Alkaline phosphatase 104 35 - 104 U/L   AST 20 10 - 35 U/L   ALT 16 5 - 50 U/L   Total bilirubin 0.2 0.0 - 1.2 mg/dL  Lipase level  Result Value Ref Range   Lipase 29 13 - 60 U/L  Urinalysis screen and microscopy, with reflex to culture   Specimen: Urine  Result Value Ref Range   Specimen site Clean catch    Color, UA Straw    Appearance, UA Clear    Specific gravity, UA 1.030 1.001 -  1.035   pH, UA 5.5 5.0 - 8.5   Protein, UA Negative Negative   Glucose, UA Negative Negative   Ketones, UA Negative Negative   Bilirubin, UA Negative Negative   Blood, UA Negative Negative   Nitrite, UA Negative Negative   Urobilinogen, UA <2.0 <2.0 mg/dL   Leukocyte esterase, UA Negative Negative   Epithelial cells, UA 3 0 - 5 /hpf   WBC, UA 1 0 - 4 /hpf   RBC, UA 1 0 - 5 /hpf   Bacteria, UA Few None seen   Yeast, UA None seen None seen   Yeast with pseudohyphae, UA None seen None seen  hCG qualitative, serum screen  Result Value Ref Range   hCG qualitative, serum Negative   Creatine kinase, total (CPK)  Result Value Ref Range   Creatine kinase SEE COMMENT 26 - 192 U/L  Troponin T  Result Value Ref Range   Troponin T <6 0 - 19 ng/L  Phosphorus level  Result Value Ref Range   Phosphorus SEE COMMENT 2.4 - 4.5 mg/dL  Magnesium  level  Result Value Ref Range   Magnesium  2.1 1.6 - 2.6 mg/dL  Lactic acid level - Now and repeat 2x every 3 hours  Result Value Ref Range   Lactic acid 1.0 0.5 - 2.2 mmol/L  Troponin T  Result Value Ref  Range   Troponin T <6 0 - 19 ng/L  Lactic acid level - Now and repeat 2x every 3 hours  Result Value Ref Range   Lactic acid 1.2 0.5 - 2.2 mmol/L  Estimated GFR  Result Value Ref Range   eGFR creat (CKD-EPI 2021) 88 mL/min/1.73 m^2   eGFR Interpretation see below   Phosphorus level  Result Value Ref Range   Phosphorus 3.4 2.4 - 4.5 mg/dL  Creatine kinase, total (CPK)  Result Value Ref Range   Creatine kinase 134 26 - 192 U/L  Protein, CSF  Result Value Ref Range   Protein, CSF 23 15 - 45 mg/dL  Glucose level, CSF  Result Value Ref Range   Glucose, CSF 77 (H) 40 - 70 mg/dL  CSF cell count with differential  Result Value Ref Range   Color, CSF Colorless    Appearance, CSF Clear    RBC, CSF 43 (H) 0 - 1 /CMM   WBC, CSF 4 0 - 5 /CMM   CSF mononuclear cell 4/CMM   Basic metabolic panel  Result Value Ref Range   Sodium 142 135 - 148 mEq/L   Potassium 3.8 3.5 - 5.0 mEq/L   Chloride 108 98 - 112 mEq/L   CO2 21 (L) 24 - 31 mEq/L   Anion gap 13 7 - 15 mEq/L   BUN 5 (L) 6 - 20 mg/dL   Creatinine 9.24 9.49 - 0.90 mg/dL   Glucose 881 (H) 65 - 99 mg/dL   Calcium  9.0 8.3 - 10.2 mg/dL  CBC hemogram  Result Value Ref Range   WBC 10.95 4.50 - 11.00 k/uL   RBC 4.60 4.20 - 5.50 m/uL   HGB 10.5 (L) 12.0 - 16.0 g/dL   HCT 67.4 (L) 62.9 - 52.9 %   MCV 70.7 (L) 82.0 - 100.0 fL   MCH 22.8 (L) 27.0 - 34.0 pg   MCHC 32.3 31.0 - 37.0 g/dL   RDW - SD 55.4 62.9 - 55.0 fL   MPV 9.5 8.8 - 13.2 fL   Platelet count 428 (H) 150 - 400 k/uL   Nucleated RBC 0.00 /100  WBC  Estimated GFR  Result Value Ref Range   eGFR creat (CKD-EPI 2021) 110 mL/min/1.73 m^2   eGFR Interpretation see below   Basic metabolic panel  Result Value Ref Range   Sodium 140 135 - 148 mEq/L   Potassium 4.5 3.5 - 5.0 mEq/L   Chloride 106 98 - 112 mEq/L   CO2 22 (L) 24 - 31 mEq/L   Anion gap 12 7 - 15 mEq/L   BUN 5 (L) 6 - 20 mg/dL   Creatinine 9.23 9.49 - 0.90 mg/dL   Glucose 847 (H) 65 - 99 mg/dL   Calcium  9.4 8.3 -  10.2 mg/dL  CBC hemogram  Result Value Ref Range   WBC 9.38 4.50 - 11.00 k/uL   RBC 4.87 4.20 - 5.50 m/uL   HGB 11.0 (L) 12.0 - 16.0 g/dL   HCT 65.6 (L) 62.9 - 52.9 %   MCV 70.4 (L) 82.0 - 100.0 fL   MCH 22.6 (L) 27.0 - 34.0 pg   MCHC 32.1 31.0 - 37.0 g/dL   RDW - SD 55.3 62.9 - 55.0 fL   MPV 9.8 8.8 - 13.2 fL   Platelet count 442 (H) 150 - 400 k/uL   Nucleated RBC 0.00 /100 WBC  Estimated GFR  Result Value Ref Range   eGFR creat (CKD-EPI 2021) 108 mL/min/1.73 m^2   eGFR Interpretation see below   ECG 12 lead  Result Value Ref Range   Ventricular rate 101    Atrial rate 101    PR interval 166    QRSD interval 94    QT interval 364    QTC interval 471    P axis 1 41    QRS axis 1 32    T wave axis -1    EKG impression      Sinus tachycardia-Otherwise normal ECG-In automated comparison with ECG of 23-Apr-2023 03:15,-Nonspecific T wave abnormality no longer evident in Anterior leads-Electronically Signed By Quintin MD, Deward [phone]) on 05/27/2023 8:02:02 PM    Radiology CT Renal Stone Protocol  Final Result  EXAMINATION:  CT RENAL STONE PROTOCOL    CLINICAL HISTORY:  headache  neck pain  pain shooting down legs  abd   pain............    TECHNIQUE:  Multiple axial images of the abdomen and pelvis were obtained without   intravenous administration of iodinated contrast. Sagittal and coronal   computerized reformatted images were also obtained. The lack of   intravenous contrast reduces the sensitivity of   detecting solid organ disease. Radiation dose reduction technique was   utilized.    COMPARISON:  04/23/2023    IMPRESSION:    Abdomen:    1.     There is no urinary tract calculus or hydronephrosis.  2.    Liver, spleen, pancreas, adrenals, and kidneys do not demonstrate   any masses without contrast. Small stable cyst in the liver.  3.    Status post cholecystectomy.  4.    There is no retroperitoneal adenopathy or ascites.  5.    There are no dilated or  thickened loops of bowel.    Pelvis:    1.     Status post appendectomy  2.    No pelvic mass, adenopathy, or fluid collection.  3.    A previously noted right ovarian cyst has resolved.        SUMMARY: No urolithiasis. No acute abnormality in the abdomen or pelvis.      [id number]    CT Cervical  Spine Wo Contrast  Final Result  EXAMINATION: CT CERVICAL SPINE WO CONTRAST    CLINICAL HISTORY: headache  neck pain  pain shooting down legs  abd   pain............    COMPARISON:  April 26, 2022.    TECHNIQUE: Axial noncontrast enhanced images of the cervical spine were   obtained with coronal and sagittal reconstructed algorithms. CT imaging   was performed with iterative reconstruction technique and/or automated   exposure control to reduce radiation   dose.    FINDINGS:    No fracture identified. Craniocervical junction is intact.    Alignment is within normal limits. Straightening of the cervical lordosis.    No lytic or sclerotic lesion to suggest infection or neoplasm.    No prevertebral edema or collection identified. There is no visualized   mass lesion. Lung apices show no discrete lesions.       Axial images through the disc spaces demonstrate the following:    C1-C2: No significant narrowing.    C2-C3: No significant posterior disc disease, spinal canal or neural   foraminal stenosis.    C3-C4: No significant posterior disc disease, spinal canal or neural   foraminal stenosis.    C4-C5: No significant posterior disc disease, spinal canal or neural   foraminal stenosis.    C5-C6: No significant posterior disc disease, spinal canal or neural   foraminal stenosis.    C6-C7: No significant posterior disc disease, spinal canal or neural   foraminal stenosis.    C7-T1: No significant posterior disc disease, spinal canal or neural   foraminal stenosis.          IMPRESSION:    No significant cervical spine abnormality identified.    No evidence of  acute fracture.          [id number]      CT Head Wo Contrast  Final Result  EXAMINATION:  CT HEAD WO CONTRAST    CLINICAL HISTORY:  headache  neck pain  pain shooting down legs  abd   pain............    COMPARISON:  CT head April 23, 2023    TECHNIQUE: Noncontrast head CT performed using radiation dose reduction   techniques.  Technical factors are evaluated and adjusted to ensure   appropriate moderation of exposure.  Automated dose management technology   is applied to adjust radiation exposure   while achieving a diagnostic quality image.      FINDINGS:    No evidence of acute intracranial hemorrhage, mass, mass effect, midline   shift, or acute infarct.     Ventricles and sulci are normal in appearance for patient's age.  Basal   cisterns are clear. Calvarium is intact.    Orbits are normal in appearance. No significant paranasal sinus mucosal   thickening. Mastoid air cells are clear.     IMPRESSION:    1. No CT evidence of acute intracranial abnormality.    [id number]       Procedures ECG ED Preliminary Interpretation - Not an Order  Date/Time: 06/13/2023 8:56 PM  Performed by: Lauretha Almarie Dragon, MD Authorized by: Lauretha Almarie Dragon, MD   Rate:    ECG rate:  101   ECG rate assessment: tachycardic   Rhythm:    Rhythm: sinus tachycardia   Comments:     Sinus tachycardiac at 101, nl intervals, nl axis, no ST changes or TWIs   Scoring Tools  Differential Diagnoses  Included but was not limited to gastritis, nonspecific abd pain, dyspepsia, bowel obstruction, gerd, diverticulitis, AAA, cholecystitis, pancreatitis, appendicitis, epiploic appendagitis,  renal stone, UTI, PID, ovarian torsion, discomfort of pregnancy, tubo-ovarian abscess, ectopic pregnancy, HELLP syndrome Final Diagnoses   Final diagnoses:  Chronic abdominal pain  Acute nonintractable headache, unspecified headache type  Neck pain   Drug-seeking behavior    Disposition  This patient has a disposition of Observation.  ED Attestations  Attestations       Lauretha Almarie Dragon, MD 05/29/23 1639    Lauretha Almarie Dragon, MD 06/13/23 2059

## 2023-05-28 NOTE — Discharge Summary (Signed)
 Discharge Summary    Name: [name]   MRN: [id number]   Admission Date: 05/26/2023   Discharge Date:  05/28/23  Admitting Physician: Asma Fazal Lakhva, MD   Attending Physician: Lakhva, Asma Fazal, MD   Discharge Physician: Asma Fazal Lakhva, MD   Primary Care Physician at Discharge: No Pcp Asked None   Admission Diagnosis:  Neck pain [M54.2] Chronic abdominal pain [R10.9, G89.29] Headache [R51.9] Acute nonintractable headache, unspecified headache type [R51.9]   Discharge Diagnoses:  Headache, neck pain, fever,chills, B/l leg numbness     Rule out stroke, rule out meningitis Bl occipital neuralgia Acute on Chronic abdominal pain d/t stage IV endometriosi SLE Hx of PE/DVT on eliquis  GERD/Barrett's esophagus Seizure d/o on keppra  GAD  Consultants: Treatment Team       Provider   Role Specialty   * Marlis Roderic Lash, MD     Consulting Physician Pain Medicine        Operative Procedures Performed/Procedures:  none  History of Present Illness: [name] is a 28 y.o. female with pmhx of  Chronic abdominal pain d/t stage IV endometriosis, SLE, Hx of PE/DVT on eliquis , GERD/Barrett's esophagus , seizure d/o on keppra , GAD, pw 1 day of headache, neck pain, fever,chills, B/l leg numbness x 2 days and acute on chronic abd pain x 2 days.   Pt started having B/l leg numbness x 2 days and headache, neck pain, fever,chills since yesterday.   Patient also with longstanding history of abdominal pain due to endometriosis which got exacerbated 2 days ago, severe stabbing pain associated with nausea vomiting. Recently underwent negative GI workup in March which included MRI and EGD/colonoscopy. In April, she had TV US  which showed 3.8 cm simple right ovarian cyst which is to be followed. She has required admission in past for IVF, prn antiemetics, and pain control.  She has a history of bowel endometriosis and has been under the care of multiple specialists at [facility]. She was referred to colorectal oncology due to concerns that her symptoms may not be solely attributable to endometriosis. Appointment is on 05/20 with Arnold Booty. A previous colonoscopy was negative, but an MRI suggested inflammation of cecum, leading to a recommendation for a repeat colonoscopy. She has been managing her symptoms with Zofran , which she took prior to arrival, but it was ineffective in preventing emesis. She denies taking any other medications for her symptoms.   Hospital Course:  [name] is a 28 yo African American F admitted on 05/26/2023 with PMH of stage IV endometriosis, SLE, history of PE/DVT, seizure disorder who presented with headache, neck pain, fever, chills, bilateral leg numbness and neurological symptoms concerning for meningitis. CT head/CTA were normal. LP on 5/5 showed normal results, ruling out infection. Empiric ceftriaxone  and vancomycin were started initially. Patient was diagnosed with bilateral occipital neuralgia, treated with IV Robaxin  500mg  TID and received occipital nerve block on 5/6. Eliquis  was held for procedures. She was referred to colorectal oncology due to concerns that her symptoms may not be solely attributable to endometriosis. Appointment is on 05/20 with Arnold Booty.     Pertinent Test Results:  Reviewed with patient  Condition at Discharge: Stable  Pt was seen and examined on day of discharge    Diet: Discharge Diet Orders      Ordered     Start   05/28/23 1154  Discharge Diet- Regular       Question:  Discharge Diet:  Answer:  Regular  05/28/23 0000            Activity:  Discharge Activity Orders      Ordered     Start   05/28/23 1154  Activity as tolerated        05/28/23 0000            Follow Up: Discharge Follow up Orders      Ordered     Start   05/28/23 1154  Follow-up with primary care physician       Comments: F/u with PCP in 1 week   05/28/23 0000   05/28/23 1154  Follow-up with  physician       Comments: F/u with your pain team in 1 week   05/28/23 0000   05/28/23 1154  Follow-up with physician       Comments: F/u with colorectal oncology for endometriosis. Appointment is on 05/20 with Arnold Booty.   05/28/23 0000   05/28/23 1154  Follow-up with physician       Comments: F/u with Neurologist in 2-4 weeks   05/28/23 0000            Discharge Disposition:  Discharge Home [1]   Active Issues Requiring Follow-up:  As above  Test Results Pending at Discharge:  None  Medications at Discharge:    Home Medications     TAKE these medications      Sig Dispense/Refill Start Comment  acetaminophen  325 MG tablet Commonly known as: TYLENOL   Take 2 tablets (650 mg total) by mouth every 6 (six) hours as needed for mild pain for up to 30 days.  Refill: 0     albuterol  90 mcg/actuation inhaler  Inhale 2 puffs every 6 (six) hours as needed for wheezing or shortness of breath.  Refill: 0     ALPRAZolam  1 MG tablet Commonly known as: XANAX   Take 1.5 tablets (1.5 mg total) by mouth 2 (two) times a day.  Refill: 0     apixaban  5 mg tablet Commonly known as: ELIQUIS   Take 1 tablet (5 mg total) by mouth 2 (two) times a day.  Refill: 0     hydroxychloroquine  200 mg tablet Commonly known as: PLAQUENIL   Take 2 tablets (400 mg total) by mouth nightly .rheumatoid arthritis.  Refill: 0     levETIRAcetam  500 MG tablet Commonly known as: KEPPRA   Take 1 tablet (500 mg total) by mouth 2 (two) times a day.  Refill: 0     lidocaine  4 %  Place 1 patch on the skin daily for 5 days. Remove & Discard patch within 12 hours or as directed by MD  Dispense: 5 patch Refill: 0  Start date: May 29, 2023    methocarbamoL  500 MG tablet Commonly known as: ROBAXIN   Take 1 tablet (500 mg total) by mouth 3 (three) times a day as needed for muscle spasms.  Dispense: 15 tablet Refill: 0     norethindrone  5 mg tablet Commonly known as: Gallifrey   Take 2 tablets (10 mg  total) by mouth nightly for 30 days.  Dispense: 60 tablet Refill: 0     oxyCODone -acetaminophen  5-325 mg per tablet Commonly known as: PERCOCET  Take 1 tablet by mouth every 6 (six) hours as needed for severe pain .acute pain. Max Daily Amount: 4 tablets  Dispense: 15 tablet Refill: 0     promethazine  25 MG tablet Commonly known as: PHENERGAN   Take 1 tablet (25 mg total) by mouth every 6 (six) hours as needed for  nausea or vomiting.  Dispense: 15 tablet Refill: 0     zolpidem  10 mg tablet Commonly known as: AMBIEN   Take 2 tablets (20 mg total) by mouth nightly.  Refill: 0           Patient Instructions:  Please seek medical care if any acute symptoms not limited to acute pain, chest pain, shortness of breath, acute abd pain,Temperature greater than 100.5  Total time spent preparing patient for discharge is greater than 30 minutes.   *Some images could not be shown.

## 2023-05-31 NOTE — Nursing Note (Signed)
 Report given to RN Ambra. Patient is s/p epidural blood patch. Slight headache. Patient complain is mostly back pain. No nausea. VSS. 3 punctured sites to lower back. No bleeding and drainage noted. Dressings cdi. Patient ambulated to restroom without calling the staff. Patient is on strict bedrest but patient said, she needs to go to the restroom. Assisted back to bed and informed patient that she has to follow the bedrest.

## 2023-05-31 NOTE — ED Notes (Signed)
 Called IR for update and state there isn't a known time for procedure at this time. Pt made aware to stay NPO.    Carlin Limbo, RN 05/31/23 1025

## 2023-05-31 NOTE — Progress Notes (Signed)
 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 Med rec done at bedside. -Pt takes Zolpidem  10-20mg  qhs (approved by MD)    Medication List     ASK your doctor about these medications      Sig Start Comment  acetaminophen  325 MG tablet Commonly known as: TYLENOL   Take 2 tablets (650 mg total) by mouth every 6 (six) hours as needed for mild pain for up to 30 days.     ALPRAZolam  1 MG tablet Commonly known as: XANAX   Take 1.5 tablets (1.5 mg total) by mouth 2 (two) times a day.     apixaban  5 mg tablet Commonly known as: ELIQUIS   Take 1 tablet (5 mg total) by mouth 2 (two) times a day.     BIOFREEZE (MENTHOL) TOP  Apply 1 Application topically as needed (pain).     calcium  carbonate 200 mg calcium  (500 mg) chewable tablet Commonly known as: TUMS  Chew 1 tablet (500 mg of Calcium  Carbonate total) as needed for indigestion or heartburn.     diphenhydrAMINE -acetaminophen  25-500 mg tablet Commonly known as: TYLENOL  PM  Take 1 tablet by mouth nightly as needed for sleep (insomnia by zolpiden not effective).     fluticasone  propionate 50 mcg/actuation nasal spray Commonly known as: FLONASE   2 sprays (100 mcg total) by Each Naris route daily as needed for rhinitis.     GAS-X ORAL  Take 1 each by mouth as needed (flatulence/bloating).     ginger root extract 15 mg tablet,chewable  Chew 1 each as needed (nausea/vomiting).     hydroxychloroquine  200 mg tablet Commonly known as: PLAQUENIL   Take 2 tablets (400 mg total) by mouth nightly .rheumatoid arthritis.     levETIRAcetam  500 MG tablet Commonly known as: KEPPRA   Take 1 tablet (500 mg total) by mouth 2 (two) times a day.     lidocaine  4 %  Place 1  patch on the skin daily for 5 days. Remove & Discard patch within 12 hours or as directed by MD     Lumify 0.025 % drops Generic drug: brimonidine  Administer 2 drops to both eyes as needed (ocular redness).     methocarbamoL  500 MG tablet Commonly known as: ROBAXIN   Take 1 tablet (500 mg total) by mouth 3 (three) times a day as needed for muscle spasms.     norethindrone  5 mg tablet Commonly known as: Gallifrey   Take 2 tablets (10 mg total) by mouth nightly for 30 days.     oxyCODone -acetaminophen  5-325 mg per tablet Commonly known as: PERCOCET  Take 1 tablet by mouth every 6 (six) hours as needed for severe pain .acute pain. Max Daily Amount: 4 tablets     pantoprazole  40 MG EC tablet Commonly known as: PROTONIX   Take 1 tablet (40 mg total) by mouth 2 (two) times a day.     PRENATAL VITAMIN ORAL  Take 1 each by mouth daily. For endometriosis     promethazine  25 MG tablet Commonly known as: PHENERGAN   Take 1 tablet (25 mg total) by mouth every 6 (six) hours as needed for nausea or vomiting.     VITAMIN D3 ORAL  Take 1 each by mouth daily.     zolpidem   10 mg tablet Commonly known as: AMBIEN   Take 2 tablets (20 mg total) by mouth nightly. Takes 1-2 tab per night depending on how she feels.           Karani-Patel, Neha, [facility]   ^Medication History Review

## 2023-05-31 NOTE — ED Notes (Signed)
 Patient's mother at bedside. Patient is AAOx4, ABC's intact, NAD, respirations are even and unlabored on room, steady gait from stretcher to wheelchair. All belongings with patient and family.   All needs met at this time. Discharge instructions provided to patient and family. Patient and family verbalized understanding.    Singson, Angelie Ann, RN 05/31/23 [phone]

## 2023-05-31 NOTE — ED Provider Notes (Signed)
 Emergency Department Provider Note Location: [facility] EMERGENCY DEPARTMENT  Patient ID: [name] is a 28 y.o. female. Arrival Date/Time: 05/31/2023 0615  Chief Complaint   Chief Complaint  Patient presents with  . Back Pain    S/p LP 05/26/23  . Headache  . Leg Pain  . Numbness    Left leg x 6  days    History of Present Illness  28 year old with history of endometriosis recently here in the hospital and underwent LP presents now with pain in the back and headache that is positional.  No fever.  No localized rash.  She reports vomiting secondary to pain.  No vision changes.  She states that she does not feel like she was cared for appropriately while in the hospital and had many questions and was still not feeling well when she was discharged.  She states she has baseline endometriosis pain but this headache is different than any headache she has had before different than her previous migraines.   Back Pain Associated symptoms: headaches and leg pain   Headache Associated symptoms: back pain   Leg Pain Associated symptoms: back pain and numbness   Numbness Associated symptoms: headaches     History  Allergies Allergies  Allergen Reactions  . Amoxicillin Anaphylaxis and PENFAST 4+    Tolerated ceftriaxone  2021 PenFast Score 4+:  High risk of positive penicillin allergy (50%) Oral challenge NOT recommended  Cephalosporins may be considered under close monitoring   . Azithromycin Anaphylaxis  . Fish Containing Products Anaphylaxis  . Gluten Itching and Rash    celiac  . Peanut Hives, Itching and Rash  . Celebrex [Celecoxib] Other (See Comments)    Do not take per MD  . Duloxetine  Other (See Comments)    caused suicidal thoughts   . Iodine Itching    Can be taken when diphenhydramine  premed is given.  . Ketorolac  Other (See Comments)    Do not take per MD  . Nsaids (Non-Steroidal Anti-Inflammatory Drug) Other (See Comments)    Do  not take per MD    Past Medical History Past Medical History:  Diagnosis Date  . Anemia Slightly Anemic  . Asthma   . Celiac disease   . Colitis   . Endometriosis Stage IV Severe  . Fibroid A few  . Immune deficiency disorder (HCC) Lupus  . Lupus   . Peptic ulceration Sometimes    Past Surgical History Past Surgical History:  Procedure Laterality Date  . ABDOMINAL SURGERY     endometreosis  . CHOLECYSTECTOMY    . COLONOSCOPY N/A 04/09/2023   Procedure: COLONOSCOPY;  Surgeon: Chilton Standing, MD;  Location: [facility] ENDOSCOPY;  Service: Gastroenterology;  Laterality: N/A;  . COLPOSCOPY    . CYSTECTOMY    . DILATION AND CURETTAGE OF UTERUS    . ENDOMETRIAL ABLATION    . ENDOMETRIAL BIOPSY    . ESOPHAGOGASTRODUODENOSCOPY (EGD) N/A 04/09/2023   Procedure: EGD with cold bx;  Surgeon: Chilton Standing, MD;  Location: [facility] ENDOSCOPY;  Service: Gastroenterology;  Laterality: N/A;  . MYOMECTOMY    . POLYPECTOMY    . SINUS SURGERY      Past Family History Family History  Problem Relation Age of Onset  . Colon cancer Other   . Diabetes Father   . No history of cancer Father        Prostate    Maternal Grandfather        Prostate    Maternal Grandmother  Ovarian and Breast Cancer    Paternal Aunt        Colon Cancer    Paternal Grandmother        Ovarian  . Prostate cancer Father   . Rheum arthritis Mother   . Stroke Maternal Grandmother        Stroke (my grandmother) passed away  . Thalassemia Father   . Uterine cancer Maternal Grandmother     Past Social History Social History   Tobacco Use  . Smoking status: Never  . Smokeless tobacco: Never  . Tobacco comments:    Never  Vaping Use  . Vaping status: Never Used  Substance and Sexual Activity  . Alcohol use: No  . Drug use: No  . Sexual activity: Not Currently    Partners: Male    Birth control/protection: Abstinence, Other    Comment: Pill  Social History Narrative  . Not on file    Past  Obstetric History OB History  No obstetric history on file.    Medications   ED Medications   Medication Sig Disc. Start Date End Date Taking? Authorizing Provider  BIOFREEZE, MENTHOL, TOP Apply 1 Application topically as needed (pain).     Provider, Not In System, MD  brimonidine (Lumify) 0.025 % drops Administer 2 drops to both eyes as needed (ocular redness).     Provider, Not In System, MD  calcium  carbonate (TUMS) 200 mg calcium  (500 mg) chewable tablet Chew 1 tablet (500 mg of Calcium  Carbonate total) as needed for indigestion or heartburn.     Provider, Not In System, MD  cholecalciferol, vitamin D3, (VITAMIN D3 ORAL) Take 1 each by mouth daily.    Yes Provider, Not In System, MD  diphenhydrAMINE -acetaminophen  (TYLENOL  PM) 25-500 mg tablet Take 1 tablet by mouth nightly as needed for sleep (insomnia by zolpiden not effective).     Provider, Not In System, MD  fluticasone  propionate (FLONASE ) 50 mcg/actuation nasal spray 2 sprays (100 mcg total) by Each Naris route daily as needed for rhinitis.     Provider, Not In System, MD  ginger root extract 15 mg tablet,chewable Chew 1 each as needed (nausea/vomiting).     Provider, Not In System, MD  pantoprazole  (PROTONIX ) 40 MG EC tablet Take 1 tablet (40 mg total) by mouth 2 (two) times a day.    Yes Provider, Not In System, MD  prenatal vit no.124/iron/folic (PRENATAL VITAMIN ORAL) Take 1 each by mouth daily. For endometriosis    Yes Provider, Not In System, MD  simethicone  (GAS-X ORAL) Take 1 each by mouth as needed (flatulence/bloating).     Provider, Not In System, MD  acetaminophen  (TYLENOL ) 325 MG tablet Take 2 tablets (650 mg total) by mouth every 6 (six) hours as needed for mild pain for up to 30 days.  05/28/23 06/27/23  Lakhva, Asma Fazal, MD  lidocaine  4 % Place 1 patch on the skin daily for 5 days. Remove & Discard patch within 12 hours or as directed by MD Patient taking differently: Place 1 patch on the skin daily. Pt states she does  not get relief, but still puts it on Remove & Discard patch within 12 hours or as directed by MD  05/29/23 06/03/23  Lakhva, Asma Fazal, MD  methocarbamoL  (ROBAXIN ) 500 MG tablet Take 1 tablet (500 mg total) by mouth 3 (three) times a day as needed for muscle spasms. Patient taking differently: Take 1 tablet (500 mg total) by mouth 2 (two) times a day.  05/28/23  Yes Lakhva, Asma Fazal, MD  norethindrone  (Gallifrey ) 5 mg tablet Take 2 tablets (10 mg total) by mouth nightly for 30 days.  05/28/23 06/27/23 Yes Lakhva, Asma Fazal, MD  oxyCODone -acetaminophen  (PERCOCET) 5-325 mg per tablet Take 1 tablet by mouth every 6 (six) hours as needed for severe pain .acute pain. Max Daily Amount: 4 tablets  05/28/23  Yes Lakhva, Asma Fazal, MD  levETIRAcetam  (KEPPRA ) 500 MG tablet Take 1 tablet (500 mg total) by mouth 2 (two) times a day.    Yes Provider, Not In System, MD  promethazine  (PHENERGAN ) 25 MG tablet Take 1 tablet (25 mg total) by mouth every 6 (six) hours as needed for nausea or vomiting. Patient taking differently: Take 1 tablet (25 mg total) by mouth every 6 (six) hours as needed for nausea or vomiting. Pt preferred antiemetic  04/26/23  Yes Mogri, Ebrahim Mustafa, MD  norethindrone  (Gallifrey ) 5 mg tablet Take 2 tablets (10 mg total) by mouth nightly for 30 days. Yes 04/26/23 05/28/23  Mogri, Andra Hollering, MD  oxyCODone -acetaminophen  (PERCOCET) 5-325 mg per tablet Take 1 tablet by mouth every 4 (four) hours as needed for severe pain .acute pain. Max Daily Amount: 6 tablets Yes 04/26/23 05/28/23  Mogri, Andra Hollering, MD  apixaban  (ELIQUIS ) 5 mg tablet Take 1 tablet (5 mg total) by mouth 2 (two) times a day.     Provider, Not In System, MD  albuterol  90 mcg/actuation inhaler Inhale 2 puffs every 6 (six) hours as needed for wheezing or shortness of breath. Yes  05/31/23  Provider, Not In System, MD  zolpidem  (AMBIEN ) 10 mg tablet Take 2 tablets (20 mg total) by mouth nightly. Takes 1-2 tab per night depending on how she  feels.    Yes Provider, Not In System, MD  ALPRAZolam  (XANAX ) 1 MG tablet Take 1.5 tablets (1.5 mg total) by mouth 2 (two) times a day.  04/18/22  Yes Provider, Not In System, MD  hydrOXYchloroQUINE  (PLAQUENIL ) 200 mg tablet Take 2 tablets (400 mg total) by mouth nightly .rheumatoid arthritis.    Yes [provider]    Review of Systems  Review of Systems  Musculoskeletal:  Positive for back pain.  Neurological:  Positive for headaches.      Physical Exam   ED Triage Vitals [05/31/23 0617]  Temp Heart Rate Respiratory Rate BP SpO2  (!) 97.1 F 131 18 (!) 156/107 96 %    Temp src Heart Rate Source Patient Position BP Location FiO2 %  -- Monitor Sitting Left arm --   There is no height or weight on file to calculate BMI.  Physical Exam Vitals and nursing note reviewed.  Constitutional:      Appearance: Normal appearance. She is obese.  HENT:     Head: Normocephalic and atraumatic.     Mouth/Throat:     Mouth: Mucous membranes are moist.     Pharynx: Oropharynx is clear.  Eyes:     Extraocular Movements: Extraocular movements intact.     Conjunctiva/sclera: Conjunctivae normal.     Pupils: Pupils are equal, round, and reactive to light.  Neck:     Comments: Range of motion of the neck full.  No meningismus. Cardiovascular:     Pulses: Normal pulses.  Pulmonary:     Effort: Pulmonary effort is normal.  Abdominal:     General: Abdomen is flat.  Musculoskeletal:        General: Normal range of motion.     Cervical back: Neck supple.  Comments: Back with mild tenderness in the lumbar area no surrounding erythema no palpable masses.  No crepitus.  Skin:    General: Skin is warm and dry.     Capillary Refill: Capillary refill takes less than 2 seconds.     Findings: No rash.  Neurological:     General: No focal deficit present.     Mental Status: She is alert. Mental status is at baseline.  Psychiatric:        Mood and Affect: Mood normal.        Behavior:  Behavior normal.        Thought Content: Thought content normal.        Judgment: Judgment normal.     ED Course   Clinical Impressions as of 05/31/23 1358  Headache, post-lumbar puncture  Chronic pain syndrome     MDM  Medical Decision Making Patient with post LP headache that seems to be positional.  No meningismus.  No fever I do not suspect meningitis at this time.  I do believe patient would benefit from blood patch.  Patient requesting IV Dilaudid  she states this is the only thing that helps however there is evidence to suggest that opiates for the treatment of headache can be detrimental as it can cause rebound headache.  Plan is to coordinate with IR for blood patch and hopefully be able to discharge home as she has had a fairly complete recent inpatient workup.  Patient remains in significant pain and is very wary about leaving the hospital in this condition as she was just in the hospital and is returned.  Will reach out to the previous team for possible observation to ensure better resolution of headache and likely pain management reengagement.  Amount and/or Complexity of Data Reviewed Labs: ordered. Radiology: ordered.  Risk OTC drugs. Prescription drug management. Decision regarding hospitalization.    Labs Results for orders placed or performed during the hospital encounter of 05/31/23  Urine culture  Result Value Ref Range   Urine culture SEE COMMENT   CBC with platelet and differential  Result Value Ref Range   WBC 10.65 4.50 - 11.00 k/uL   RBC 5.06 4.20 - 5.50 m/uL   HGB 11.4 (L) 12.0 - 16.0 g/dL   HCT 64.5 (L) 62.9 - 52.9 %   MCV 70.0 (L) 82.0 - 100.0 fL   MCH 22.5 (L) 27.0 - 34.0 pg   MCHC 32.2 31.0 - 37.0 g/dL   RDW - SD 56.0 62.9 - 55.0 fL   MPV 9.3 8.8 - 13.2 fL   Platelet count 415 (H) 150 - 400 k/uL   Nucleated RBC 0.00 /100 WBC   Neutrophils 44.6 39.0 - 69.0 %   Lymphocytes 46.1 (H) 25.0 - 45.0 %   Monocytes 5.2 0.0 - 10.0 %   Eosinophils  3.6 0.0 - 5.0 %   Basophils 0.2 0.0 - 1.0 %   Immature granulocytes 0.3 0.0 - 1.0 %  Prothrombin time with INR  Result Value Ref Range   Prothrombin time 13.8 11.5 - 14.5 sec   INR 1.0   Partial thromboplastin time, activated  Result Value Ref Range   PTT 31.1 23.0 - 36.0 sec  Urinalysis screen and microscopy, with reflex to culture   Specimen: Urine  Result Value Ref Range   Specimen site Clean catch    Color, UA Colorless    Appearance, UA Clear    Specific gravity, UA 1.022 1.001 - 1.035   pH, UA 6.0  5.0 - 8.5   Protein, UA Negative Negative   Glucose, UA Negative Negative   Ketones, UA Negative Negative   Bilirubin, UA Negative Negative   Blood, UA Negative Negative   Nitrite, UA Negative Negative   Urobilinogen, UA <2.0 <2.0 mg/dL   Leukocyte esterase, UA Negative Negative   Epithelial cells, UA 2 0 - 5 /hpf   WBC, UA 1 0 - 4 /hpf   RBC, UA None seen 0 - 5 /hpf   Bacteria, UA None seen None seen   Yeast, UA None seen None seen   Yeast with pseudohyphae, UA None seen None seen  Comprehensive metabolic panel  Result Value Ref Range   Sodium 138 135 - 148 mEq/L   Potassium Hemolyzed 3.5 - 5.0 mEq/L   Chloride 103 98 - 112 mEq/L   CO2 26 24 - 31 mEq/L   Anion gap 9 7 - 15 mEq/L   BUN 13 6 - 20 mg/dL   Creatinine 8.98 (H) 9.49 - 0.90 mg/dL   Glucose 87 65 - 99 mg/dL   Calcium  9.1 8.3 - 10.2 mg/dL   Protein 7.0 6.3 - 8.3 g/dL   Albumin 3.7 3.5 - 5.0 g/dL   Albumin/globulin ratio 1.1 0.7 - 3.8   Alkaline phosphatase 85 35 - 104 U/L   AST Hemolyzed 10 - 35 U/L   ALT SEE COMMENT 5 - 50 U/L   Total bilirubin 0.2 0.0 - 1.2 mg/dL  hCG qualitative, urine screen  Result Value Ref Range   hCG qualitative, urine Negative   Recollect request  Result Value Ref Range   Recollect request SEE COMMENT   Estimated GFR  Result Value Ref Range   eGFR creat (CKD-EPI 2021) 77 mL/min/1.73 m^2   eGFR Interpretation see below   Potassium level  Result Value Ref Range   Potassium  3.6 3.5 - 5.0 mEq/L  AST (SGOT)  Result Value Ref Range   AST 27 10 - 35 U/L  ALT (SGPT)  Result Value Ref Range   ALT 15 5 - 50 U/L    Radiology No orders to display     Procedures Procedures  Scoring Tools                     Differential Diagnoses  This patient has a differential diagnosis of .    Final Diagnoses   Final diagnoses:  Headache, post-lumbar puncture  Chronic pain syndrome    Disposition  This patient has a disposition of Admit.  ED Attestations  Attestations       Steinour, Mabel Mt, MD 05/31/23 [phone]

## 2023-07-21 NOTE — ED Provider Notes (Signed)
 Emergency Department Provider Note Location: [facility]  Patient ID: [name] is a 28 y.o. female. Arrival Date/Time: 07/21/2023 0316  Chief Complaint   Chief Complaint  Patient presents with  . Abdominal Pain    History of Present Illness   Abdominal Pain Associated symptoms: no dysuria, no hematuria, no vaginal bleeding and no vaginal discharge    Patient is a 28 year old female with a history of PE on eliquis , lupus and endometriosis, presenting to the ED with severe abdominal pain and emesis. She reports that her symptoms began after her OBGYN provided medication to manage her chronic flares of lupus and endometriosis. Following this, she experienced persistent emesis and sought care at a standalone ER, where she was admitted for 2 days. She was then [facility] a [facility], where imaging revealed a nearly 3 cm cyst.   Patient describes her pain as severe, localized from the below the belly button down to the suprapubic area,  radiation to the back. She notes that while the pain is chronic and intermittent, it can be exacerbated by factors such as new birth control pills. She is currently under the care of the [facility] and is awaiting a consultation on August 2nd to discuss potential medication for suspected ulcerative colitis. She has completed the prescribed medications from both her PCP and the standalone ER.   She denies EtOH consumption and attributes her drowsiness to sleep deprivation, stating she only sleeps approximately 2 hours per day.     History  Allergies Allergies  Allergen Reactions  . Amoxicillin Anaphylaxis and PENFAST 4+    Tolerated ceftriaxone  2021 PenFast Score 4+:  High risk of positive penicillin allergy (50%) Oral challenge NOT recommended  Cephalosporins may be considered under close monitoring   . Azithromycin Anaphylaxis  . Fish Containing Products Anaphylaxis  .  Gluten Itching and Rash    celiac  . Peanut Hives, Itching and Rash  . Celebrex [Celecoxib] Other (See Comments)    Do not take per MD  . Duloxetine  Other (See Comments)    caused suicidal thoughts   . Iodine Itching    Can be taken when diphenhydramine  premed is given.  . Ketorolac  Other (See Comments)    Do not take per MD  . Nsaids (Non-Steroidal Anti-Inflammatory Drug) Other (See Comments)    Do not take per MD    Past Medical History Past Medical History:  Diagnosis Date  . Anemia Slightly Anemic  . Asthma   . Celiac disease   . Colitis   . Endometriosis Stage IV Severe  . Fibroid A few  . Immune deficiency disorder (HCC) Lupus  . Lupus   . Peptic ulceration Sometimes    Past Surgical History Past Surgical History:  Procedure Laterality Date  . ABDOMINAL SURGERY     endometreosis  . CHOLECYSTECTOMY    . COLONOSCOPY N/A 04/09/2023   Procedure: COLONOSCOPY;  Surgeon: Chilton Standing, MD;  Location: [facility] ENDOSCOPY;  Service: Gastroenterology;  Laterality: N/A;  . COLPOSCOPY    . CYSTECTOMY    . DILATION AND CURETTAGE OF UTERUS    . ENDOMETRIAL ABLATION    . ENDOMETRIAL BIOPSY    . ESOPHAGOGASTRODUODENOSCOPY (EGD) N/A 04/09/2023   Procedure: EGD with cold bx;  Surgeon: Chilton Standing, MD;  Location: [facility] ENDOSCOPY;  Service: Gastroenterology;  Laterality: N/A;  . MYOMECTOMY    . POLYPECTOMY    . SINUS SURGERY  Past Family History Family History  Problem Relation Age of Onset  . Colon cancer Other   . Diabetes Father   . No history of cancer Father        Prostate    Maternal Grandfather        Prostate    Maternal Grandmother        Ovarian and Breast Cancer    Paternal Aunt        Colon Cancer    Paternal Grandmother        Ovarian  . Prostate cancer Father   . Rheum arthritis Mother   . Stroke Maternal Grandmother        Stroke (my grandmother) passed away  . Thalassemia Father   . Uterine cancer Maternal Grandmother     Past Social  History Social History   Tobacco Use  . Smoking status: Never  . Smokeless tobacco: Never  . Tobacco comments:    Never  Vaping Use  . Vaping status: Never Used  Substance and Sexual Activity  . Alcohol use: No  . Drug use: No  . Sexual activity: Not Currently    Partners: Male    Birth control/protection: Abstinence, Other    Comment: Pill  Social History Narrative  . Not on file    Past Obstetric History OB History  No obstetric history on file.    Medications   ED Medications  Medication Sig Disc. Start Date End Date Taking? Authorizing Provider  dicyclomine  (BENTYL ) 20 mg tablet Take 1 tablet (20 mg total) by mouth 2 (two) times a day for 30 days.  07/21/23 08/20/23  Shella Musca, MD  ondansetron  (ZOFRAN ) 4 MG tablet Take 1 tablet (4 mg total) by mouth every 8 (eight) hours as needed for nausea or vomiting for up to 30 days.  07/21/23 08/20/23 Yes Iheonunekwu, Chizite, MD  ondansetron  (ZOFRAN ) 4 MG tablet Take 1 tablet (4 mg total) by mouth every 8 (eight) hours as needed for nausea or vomiting. Yes 07/21/23 07/21/23  Iheonunekwu, Musca, MD  BIOFREEZE, MENTHOL, TOP Apply 1 Application topically as needed (pain).     Provider, Not In System, MD  brimonidine (Lumify) 0.025 % drops Administer 2 drops to both eyes as needed (ocular redness).     Provider, Not In System, MD  calcium  carbonate (TUMS) 200 mg calcium  (500 mg) chewable tablet Chew 1 tablet (500 mg of Calcium  Carbonate total) as needed for indigestion or heartburn.     Provider, Not In System, MD  cholecalciferol, vitamin D3, (VITAMIN D3 ORAL) Take 1 each by mouth daily.     Provider, Not In System, MD  diphenhydrAMINE -acetaminophen  (TYLENOL  PM) 25-500 mg tablet Take 1 tablet by mouth nightly as needed for sleep (insomnia by zolpiden not effective).     Provider, Not In System, MD  fluticasone  propionate (FLONASE ) 50 mcg/actuation nasal spray 2 sprays (100 mcg total) by Each Naris route daily as needed for  rhinitis.     Provider, Not In System, MD  ginger root extract 15 mg tablet,chewable Chew 1 each as needed (nausea/vomiting).     Provider, Not In System, MD  pantoprazole  (PROTONIX ) 40 MG EC tablet Take 1 tablet (40 mg total) by mouth 2 (two) times a day.     Provider, Not In System, MD  prenatal vit no.124/iron/folic (PRENATAL VITAMIN ORAL) Take 1 each by mouth daily. For endometriosis     Provider, Not In System, MD  simethicone  (GAS-X ORAL) Take 1 each by mouth as  needed (flatulence/bloating).     Provider, Not In System, MD  oxyCODone -acetaminophen  (PERCOCET) 5-325 mg per tablet Take 1 tablet by mouth every 6 (six) hours as needed for severe pain .acute pain. Max Daily Amount: 4 tablets  05/28/23   Lakhva, Asma Fazal, MD  levETIRAcetam  (KEPPRA ) 500 MG tablet Take 1 tablet (500 mg total) by mouth 2 (two) times a day.     Provider, Not In System, MD  apixaban  (ELIQUIS ) 5 mg tablet Take 1 tablet (5 mg total) by mouth 2 (two) times a day.     Provider, Not In System, MD  zolpidem  (AMBIEN ) 10 mg tablet Take 2 tablets (20 mg total) by mouth nightly. Takes 1-2 tab per night depending on how she feels.     Provider, Not In System, MD  ALPRAZolam  (XANAX ) 1 MG tablet Take 1.5 tablets (1.5 mg total) by mouth 2 (two) times a day.  04/18/22   Provider, Not In System, MD  hydrOXYchloroQUINE  (PLAQUENIL ) 200 mg tablet Take 2 tablets (400 mg total) by mouth nightly .rheumatoid arthritis.     [provider]    Review of Systems  Review of Systems  Gastrointestinal:  Positive for abdominal pain. Genitourinary:  Negative for dysuria, hematuria, vaginal bleeding and vaginal discharge.     Constitutional: (+) fatigue  Gastrointestinal: (+) vomiting, (+) abdominal pain  Genitourinary: (+) suprapubic pain  Musculoskeletal: (+) back pain  Psychiatric: (+) insomnia     Physical Exam   ED Triage Vitals [07/21/23 0322]  Temp Heart Rate Respiratory Rate BP SpO2  98.2 F 116 22 (!) 158/110 98 %    Temp  Source Heart Rate Source Patient Position BP Location FiO2 %  Oral Monitor Lying Right arm --   Body mass index is 50.66 kg/m.  Physical Exam Vitals and nursing note reviewed.  Constitutional:      Appearance: Normal appearance.  HENT:     Head: Normocephalic and atraumatic.     Right Ear: External ear normal.     Left Ear: External ear normal.     Nose: Nose normal.     Mouth/Throat:     Mouth: Mucous membranes are moist.     Pharynx: Oropharynx is clear.  Eyes:     General: No scleral icterus.       Right eye: No discharge.        Left eye: No discharge.  Cardiovascular:     Rate and Rhythm: Regular rhythm. Tachycardia present.     Pulses: Normal pulses.     Heart sounds: Normal heart sounds. No murmur heard. Pulmonary:     Effort: Pulmonary effort is normal. No respiratory distress.     Breath sounds: Normal breath sounds.  Abdominal:     General: Abdomen is protuberant. There is no distension.     Palpations: Abdomen is soft. There is no mass.     Tenderness: There is generalized abdominal tenderness. There is no right CVA tenderness, left CVA tenderness or guarding. Negative signs include Murphy's sign, Rovsing's sign, McBurney's sign, psoas sign and obturator sign.     Hernia: No hernia is present.  Musculoskeletal:        General: No swelling or tenderness. Normal range of motion.     Cervical back: Normal range of motion and neck supple.  Skin:    General: Skin is warm and dry.     Capillary Refill: Capillary refill takes less than 2 seconds.     Coloration: Skin is not jaundiced or  pale.  Neurological:     General: No focal deficit present.     Mental Status: She is alert and oriented to person, place, and time.     ED Course   ED Course as of 07/21/23 0623  Shella, Chizite's Documentation  Sun Jul 21, 2023  0508 Patient in no distress, observed using her laptop in the room. Upon discharge patient reports she has been vomiting nonstop, and is in a lot  of pain despite reassuring work up,Patient has had no episodes of emesis in the ED. Abdomen reassessed, no evidence of acute abdomen.  Patient informed no additional pain meds will be given at this time given no acute findings. Offered bentyl . Chart review shows patient refilled 28 tablets of tylenol  3 on 07/15/23, NaRx score 551. Will d/c with bentyl , given instructions to follow up with GI and PCP      Clinical Impressions as of 07/21/23 0623  Chronic abdominal pain  Generalized abdominal pain  Drug-seeking behavior     MDM  Medical Decision Making Amount and/or Complexity of Data Reviewed Labs: ordered. Radiology: ordered.  Risk Prescription drug management.  Assessment: The patient is a 28 year old female with a reported history of endometriosis presenting for abdominal pain and vomiting. Laboratory analysis shows no leukocytosis, hemoglobin at 10.4 (baseline), negative urine pregnancy test, and no acute process on CT abdomen/pelvis. After receiving medication, her pain improved. No vaginal bleeding/discharge, low suspicion for ectopic pregnancy, torsion given chronicity of abdominal pain. No acute findings warrant admission at this time.  Plan: - Administered morphine , Benadryl , and Zofran  in the ED - No admission recommended at this time  Diagnostics: - Labs: CBC with no leukocytosis, HGB 10.4, CMP with no AKI, urine ACG negative, urinalysis without evidence of UTI - CT abdomen/pelvis: No acute intraabdominal process  Reevaluations: - Patient reports improvement in pain after medication administration   Labs Results for orders placed or performed during the hospital encounter of 07/21/23  CBC with platelet and differential  Result Value Ref Range   WBC 7.10 4.50 - 11.00 k/uL   RBC 4.66 4.20 - 5.50 m/uL   HGB 10.4 (L) 12.0 - 16.0 g/dL   HCT 67.0 (L) 62.9 - 52.9 %   MCV 70.6 (L) 82.0 - 100.0 fL   MCH 22.3 (L) 27.0 - 34.0 pg   MCHC 31.6 31.0 - 37.0 g/dL   RDW - SD 51.6  62.9 - 55.0 fL   MPV 8.8 8.8 - 13.2 fL   Platelet count 309 150 - 400 k/uL   Neutrophils 45.0 39.0 - 69.0 %   Lymphocytes 38.6 25.0 - 45.0 %   Monocytes 6.8 0.0 - 10.0 %   Eosinophils 9.4 (H) 0.0 - 5.0 %   Basophils 0.1 0.0 - 1.0 %   Immature granulocytes 0.1 0.0 - 1.0 %  Comprehensive metabolic panel  Result Value Ref Range   Sodium 139 135 - 148 mEq/L   Potassium 3.6 3.5 - 5.0 mEq/L   Chloride 108 98 - 112 mEq/L   CO2 19 (L) 24 - 31 mEq/L   Anion gap 12 7 - 15 mEq/L   BUN 4 (L) 6 - 20 mg/dL   Creatinine 9.25 9.49 - 0.90 mg/dL   Glucose 97 65 - 99 mg/dL   Calcium  9.4 8.3 - 10.2 mg/dL   Protein 6.5 6.3 - 8.3 g/dL   Albumin 3.9 3.5 - 5.0 g/dL   Albumin/globulin ratio 1.5 0.7 - 3.8   Alkaline phosphatase 81 35 - 104  U/L   AST 18 10 - 35 U/L   ALT 11 5 - 50 U/L   Total bilirubin 0.1 (A) 0.0 - 1.2 mg/dL  Urinalysis screen with reflex to culture  Result Value Ref Range   Glucose, UA Negative Negative   Bilirubin, UA Negative Negative   Ketones, UA Negative Negative   Specific gravity, UA 1.010 1.001 - 1.035   Blood, UA Negative Negative   pH, UA 5.5 5.0 - 8.5   Protein, UA Negative Negative   Urobilinogen, UA <2.0 <2.0 E.U./dL   Nitrite, UA Negative Negative   Leukocyte esterase, UA Negative Negative   Color, UA Yellow    Appearance, UA Clear    Specimen site Clean catch   hCG qualitative, urine screen  Result Value Ref Range   hCG qualitative, urine Negative   Lipase level  Result Value Ref Range   Lipase 27 13 - 60 U/L  Estimated GFR  Result Value Ref Range   eGFR creat (CKD-EPI 2021) 112 mL/min/1.73 m^2   eGFR Interpretation see below     Radiology CT Abdomen Pelvis Wo Contrast  Final Result  EXAMINATION:  CT ABDOMEN PELVIS WO CONTRAST    CLINICAL HISTORY: Abdominal pain  acute  nonlocalized    COMPARISON:  05/26/2023    TECHNIQUE:      CT of the abdomen and pelvis without intravenous contrast. Absence of   contrast decreases sensitivity for detection of  focal lesions and vascular   pathology.    All CT scan performed using radiation dose reduction techniques. Technical   factors are evaluated and adjusted to ensure appropriate moderation of   exposure. Automated dose management technology is applied to adjust the   radiation dose to minimize expose   while  achieving a diagnostic quality image.      FINDINGS:    Lower Thorax:  Unremarkable    Hepatobiliary:  The liver is normal in caliber and contour. An 8 mm cyst   in the dome of the liver is again noted. No gross evidence of suspicioust   lesion is seen.   The gallbladder is surgically absent. No abnormal   biliary dilatation is seen.    Spleen:  The spleen is not enlarged. No gross evidence of suspicious   lesion.    Pancreas:  The pancreas is unremarkable. No inflammatory process is seen.   No gross evidence of suspicious lesion is seen. There is no evidence of   abnormal pancreatic duct dilatation.    Adrenals:  Unremarkable.    GU:  The kidneys are normal in caliber. No calculus is seen. There is no   evidence of hydronephrosis. There is no gross evidence of suspicious   lesion.  The urinary bladder is unremarkable. No abnormal wall thickening   is seen. There is no evidence of   inflammatory process.    GI: There is underdistention of the left colon There is no evidence of   inflammatory process. No abnormal wall thickening is seen. Moderate stool   burden is noted There is no evidence of bowel obstruction. No   space-occupying mass is  seen. Probable   appendectomy changes are noted.. The stomach is unremarkable.    Reproductive Organs:  Unremarkable.    Peritoneum/Retroperitoneum:  No abnormal fluid collection is seen. There   is no evidence of free air.   No pathologic lymphadenopathy or enlarged   lymph node is seen.    Musculoskeletal/abdominal wall:  No acute or pathologic osseous  abnormality The abdominal wall is unremarkable. Regional musculature is    unremarkable.    Vasculature: Abdominal aorta demonstrates a normal course and caliber. No   evidence of aortic aneurysm.     IMPRESSION:    No CT evidence of acute intra-abdominal or pelvic findings.              [id number]       Procedures Procedures  Scoring Tools                     Differential Diagnoses  This patient has a differential diagnosis of constipation, UTI, gastroparesis, endometriosis, SBO, malingering, drug-seeking behavior, low suspicion for ovarian torsion, ectopic pregnancy   Final diagnoses:  Chronic abdominal pain  Generalized abdominal pain  Drug-seeking behavior    Disposition  This patient has a disposition of Discharge.  ED Attestations  Attestations Note writing services powered by [employer] were used to generate this note for Thurnell Shackleton, MD, who performed this service on 07/21/2023.  The documentation recorded by this service accurately reflects the service I personally performed, and the decisions made by me, Thurnell Shackleton, MD.       Shackleton Thurnell, MD 07/21/23 [phone]

## 2023-07-24 ENCOUNTER — Encounter (HOSPITAL_COMMUNITY): Payer: Self-pay

## 2023-07-24 ENCOUNTER — Emergency Department (HOSPITAL_COMMUNITY): Admission: EM | Admit: 2023-07-24 | Discharge: 2023-07-24 | Disposition: A | Discharge status: Home / Self Care

## 2023-07-24 DIAGNOSIS — G8929 Other chronic pain: Secondary | ICD-10-CM | POA: Insufficient documentation

## 2023-07-24 DIAGNOSIS — R1031 Right lower quadrant pain: Secondary | ICD-10-CM | POA: Diagnosis present

## 2023-07-24 DIAGNOSIS — R1032 Left lower quadrant pain: Secondary | ICD-10-CM | POA: Diagnosis not present

## 2023-07-24 DIAGNOSIS — Z91010 Allergy to peanuts: Secondary | ICD-10-CM | POA: Diagnosis not present

## 2023-07-24 NOTE — ED Triage Notes (Signed)
 POV/ by self/ RLQ pain/ hx of ovarian cyst/ N/V/D/ pt has been recommended to have hysterectomy but pt has refused in the past/ pt reports numerous complaints at this time/ A&Ox4

## 2023-07-24 NOTE — Discharge Instructions (Signed)
 You have decided to leave before your evaluation is complete. Please return to the ED anytime you would like further evaluation and treatment for your abdominal pain.

## 2023-07-24 NOTE — ED Provider Notes (Signed)
 [place] EMERGENCY DEPARTMENT AT [facility] Provider Note   CSN: [id number] Arrival date & time: 07/24/23  8371     Patient presents with: Abdominal Pain   [name] is a 28 y.o. female.   Patient to ED with complaint of lower abdominal pain. She reports history of endometriosis, bowel endometriosis, possible ulcerative colitis, known ovarian cyst. She reports diarrhea and vomiting for the past 2 days which is not uncommon for her. No fever. She receives care here and in [place] .    Abdominal Pain      Prior to Admission medications   Medication Sig Start Date End Date Taking? Authorizing Provider  ALPRAZolam  (XANAX ) 1 MG tablet Take 1.5 mg by mouth 2 (two) times daily.    [provider]  chlorpheniramine-HYDROcodone  (TUSSIONEX) 10-8 MG/5ML Take 5 mLs by mouth at bedtime as needed for cough. 11/24/22   Rolinda Rogue, MD  DULoxetine  (CYMBALTA ) 30 MG capsule Take 30 mg by mouth at bedtime.    [provider]  hydroxychloroquine  (PLAQUENIL ) 200 MG tablet Take 200 mg by mouth 2 (two) times daily with a meal.    [provider]  norethindrone  (AYGESTIN ) 5 MG tablet Take 10 mg by mouth every evening.    [provider]  oxyCODONE  (OXY IR/ROXICODONE ) 5 MG immediate release tablet Take 1 tablet (5 mg total) by mouth every 4 (four) hours as needed for moderate pain (pain score 4-6), severe pain (pain score 7-10) or breakthrough pain. 11/16/22   Patsy Lenis, MD  predniSONE  (DELTASONE ) 20 MG tablet Take 2 tablets (40 mg total) by mouth daily. 11/24/22   Rolinda Rogue, MD  promethazine  (PHENERGAN ) 25 MG tablet Take 1 tablet (25 mg total) by mouth every 6 (six) hours as needed for nausea or vomiting. 11/16/22   Patsy Lenis, MD  zolpidem  (AMBIEN ) 10 MG tablet Take 20 mg by mouth at bedtime. 11/06/22   [provider]  dicyclomine  (BENTYL ) 20 MG tablet Take 1 tablet (20 mg total) by mouth 2 (two) times daily. 11/29/18 04/11/19   Muthersbaugh, Chiquita, PA-C  Fluticasone  Propionate HFA (FLOVENT  HFA IN) Inhale into the lungs. Patient not taking: Reported on 11/14/2019  11/14/19  [provider]    Allergies: Amoxicillin, Azithromycin, Fish-derived products, Peanut-containing drug products, Gluten meal, Iodinated contrast media, Other, Iodine, and Lactose intolerance (gi)    Review of Systems  Gastrointestinal:  Positive for abdominal pain.    Updated Vital Signs BP (!) 145/88 (BP Location: Left Arm)   Pulse (!) 107   Temp 98.5 F (36.9 C) (Oral)   Resp 20   LMP  (LMP Unknown)   SpO2 98%   Physical Exam Vitals and nursing note reviewed.  Constitutional:      Appearance: She is well-developed.  HENT:     Head: Normocephalic.  Cardiovascular:     Rate and Rhythm: Normal rate and regular rhythm.     Heart sounds: No murmur heard. Pulmonary:     Effort: Pulmonary effort is normal.     Breath sounds: Normal breath sounds. No wheezing, rhonchi or rales.  Abdominal:     General: Bowel sounds are normal. There is no distension.     Palpations: Abdomen is soft.     Tenderness: There is abdominal tenderness in the right lower quadrant, suprapubic area and left lower quadrant. There is no guarding or rebound.  Musculoskeletal:        General: Normal range of motion.     Cervical back: Normal  range of motion and neck supple.  Skin:    General: Skin is warm and dry.  Neurological:     General: No focal deficit present.     Mental Status: She is alert and oriented to person, place, and time.     (all labs ordered are listed, but only abnormal results are displayed) Labs Reviewed  CBC WITH DIFFERENTIAL/PLATELET  COMPREHENSIVE METABOLIC PANEL WITH GFR  LIPASE, BLOOD  URINALYSIS, ROUTINE W REFLEX MICROSCOPIC  PREGNANCY, URINE    EKG: None  Radiology: No results found.   Procedures   Medications Ordered in the ED - No data to display                                  Medical Decision  Making This patient presents to the ED for concern of lower abdominal pain, chronic, this involves an extensive number of treatment options, and is a complaint that carries with it a high risk of complications and morbidity.  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     Lab Tests:  I Ordered, and personally interpreted labs.  The pertinent results include:  labs ordered but patient decided to leave prior to collection    Imaging Studies ordered:  I ordered imaging studies including n/a I independently visualized and interpreted imaging which showed n/a I agree with the radiologist interpretation   Cardiac Monitoring:  The patient was maintained on a cardiac monitor.  I personally viewed and interpreted the cardiac monitored which showed an underlying rhythm of: n/a   Medicines ordered and prescription drug management:  I ordered medication including n/a  for n/a Reevaluation of the patient after these medicines showed that the patient  I have reviewed the patients home medicines and have made adjustments as needed   Test Considered:  N/a   Critical Interventions:  N/a   Consultations Obtained:  I requested consultation with the n/a,  and discussed lab and imaging findings as well as pertinent plan - they recommend: n/a   Problem List / ED Course:  Patient with history of chronic abdominal pain presents with same Chart reviewed. She does have chronic  medical problems of seizure, lupus, endometriosis, s/p appendectomy   Reevaluation:  After the interventions noted above, I reevaluated the patient and found that they have: patient up at bedside reporting she just wants to go home. She would feel better at home lying on the bathroom floor. She has had a recent evaluation with negative CT abd/pel (6/29), no fever, VSS. She does not appear ill. No vomiting observed in the ED. She is felt stable and appropriate to make her own decisions. She is told if she worsens or changes her mind about wanting to be evaluated she is welcome to return at any time.    Social Determinants of Health:  Not a smoker   Disposition:  After consideration of the diagnostic results and the patients response to treatment, I feel that the patient would benefit from discharge.   Amount  and/or Complexity of Data Reviewed Labs: ordered.        Final diagnoses:  Chronic abdominal pain    ED Discharge Orders     None          Odell Balls, PA-C 07/24/23 1849    Kammerer, Megan L, DO 07/25/23 RONOLD
